# Patient Record
Sex: Male | Born: 1955 | Race: White | Hispanic: No | Marital: Married | Smoking: Former smoker
Health system: Southern US, Community
[De-identification: ages and names within clinical notes are randomized; demographics above are authoritative.]

## PROBLEM LIST (undated history)

## (undated) DIAGNOSIS — I10 Essential (primary) hypertension: Secondary | ICD-10-CM

## (undated) DIAGNOSIS — K2981 Duodenitis with bleeding: Secondary | ICD-10-CM

## (undated) DIAGNOSIS — Z85038 Personal history of other malignant neoplasm of large intestine: Secondary | ICD-10-CM

## (undated) DIAGNOSIS — G20A1 Parkinson's disease without dyskinesia, without mention of fluctuations: Secondary | ICD-10-CM

## (undated) DIAGNOSIS — I739 Peripheral vascular disease, unspecified: Secondary | ICD-10-CM

## (undated) DIAGNOSIS — K635 Polyp of colon: Secondary | ICD-10-CM

## (undated) DIAGNOSIS — E119 Type 2 diabetes mellitus without complications: Secondary | ICD-10-CM

## (undated) DIAGNOSIS — K219 Gastro-esophageal reflux disease without esophagitis: Secondary | ICD-10-CM

## (undated) DIAGNOSIS — G20 Parkinson's disease: Secondary | ICD-10-CM

## (undated) DIAGNOSIS — C801 Malignant (primary) neoplasm, unspecified: Secondary | ICD-10-CM

## (undated) DIAGNOSIS — J449 Chronic obstructive pulmonary disease, unspecified: Secondary | ICD-10-CM

## (undated) DIAGNOSIS — K269 Duodenal ulcer, unspecified as acute or chronic, without hemorrhage or perforation: Secondary | ICD-10-CM

## (undated) DIAGNOSIS — S72009A Fracture of unspecified part of neck of unspecified femur, initial encounter for closed fracture: Secondary | ICD-10-CM

## (undated) DIAGNOSIS — H3500 Unspecified background retinopathy: Secondary | ICD-10-CM

## (undated) HISTORY — PX: ANKLE ARTHROPLASTY: SUR68

## (undated) HISTORY — PX: COLONOSCOPY: SHX174

---

## 1996-05-11 HISTORY — PX: FLEXIBLE SIGMOIDOSCOPY: SHX1649

## 2004-11-14 ENCOUNTER — Ambulatory Visit: Payer: Self-pay | Admitting: Unknown Physician Specialty

## 2004-12-22 ENCOUNTER — Emergency Department: Payer: Self-pay | Admitting: Emergency Medicine

## 2007-09-09 HISTORY — PX: FINGER SURGERY: SHX640

## 2007-09-27 ENCOUNTER — Emergency Department: Payer: Self-pay | Admitting: Emergency Medicine

## 2008-04-10 ENCOUNTER — Ambulatory Visit: Payer: Self-pay | Admitting: Unknown Physician Specialty

## 2011-07-09 ENCOUNTER — Ambulatory Visit: Payer: Self-pay | Admitting: Unknown Physician Specialty

## 2011-07-09 HISTORY — PX: ESOPHAGOGASTRODUODENOSCOPY: SHX1529

## 2011-07-10 LAB — PATHOLOGY REPORT

## 2011-12-10 HISTORY — PX: ANKLE ARTHROPLASTY: SUR68

## 2014-08-17 ENCOUNTER — Ambulatory Visit: Payer: Self-pay | Admitting: Podiatry

## 2014-08-28 ENCOUNTER — Ambulatory Visit (INDEPENDENT_AMBULATORY_CARE_PROVIDER_SITE_OTHER): Payer: BLUE CROSS/BLUE SHIELD | Admitting: Podiatry

## 2014-08-28 ENCOUNTER — Encounter: Payer: Self-pay | Admitting: Podiatry

## 2014-08-28 VITALS — BP 171/91 | HR 66 | Resp 18 | Ht 70.0 in | Wt 195.0 lb

## 2014-08-28 DIAGNOSIS — E0842 Diabetes mellitus due to underlying condition with diabetic polyneuropathy: Secondary | ICD-10-CM | POA: Diagnosis not present

## 2014-08-28 DIAGNOSIS — Q828 Other specified congenital malformations of skin: Secondary | ICD-10-CM

## 2014-08-28 NOTE — Progress Notes (Signed)
Subjective:     Patient ID: John Reilly, male   DOB: March 08, 1956, 59 y.o.   MRN: [id number]  HPI patient states he has a painful callus on the left big toe that he developed recently and he does work extensive hours and steel toe shoes and has long-term history of diabetes but it's under good control with his last A1c being 6.8   Review of Systems  All other systems reviewed and are negative.      Objective:   Physical Exam  Constitutional: He is oriented to person, place, and time.  Cardiovascular: Intact distal pulses.   Musculoskeletal: Normal range of motion.  Neurological: He is oriented to person, place, and time.  Skin: Skin is warm.  Nursing note and vitals reviewed.  neurovascular status was found to be intact with mild diminishment of vibratory but sharp tall intact. Patient has good digital perfusion is well oriented 3 and I noted on the medial side of the left hallux there is keratotic lesion formation. There is no proximal edema erythema or drainage noted     Assessment:     Lesion which is probably due to friction and patient has mild neuropathy and diabetes allowing the lesion to become enlarged    Plan:     H&P and condition explained to patient. Today I did debridement of lesion did not no drainage and flushed the area applied sterile dressing and then applied padding to take pressure off of it. I will see him back if it reoccurs or if he should develop any redness drainage or other issues he will reappoint immediately

## 2014-08-28 NOTE — Progress Notes (Signed)
   Subjective:    Patient ID: John Reilly, male    DOB: 1955/12/12, 59 y.o.   MRN: [id number]  Diabetic pt has a callus on his left foot , medial side , looks to be infected. No treatment has been done by pt.  HPI    Review of Systems  All other systems reviewed and are negative.      Objective:   Physical Exam        Assessment & Plan:

## 2014-09-24 ENCOUNTER — Ambulatory Visit: Payer: BLUE CROSS/BLUE SHIELD | Admitting: Podiatry

## 2014-10-05 ENCOUNTER — Ambulatory Visit (INDEPENDENT_AMBULATORY_CARE_PROVIDER_SITE_OTHER): Payer: BLUE CROSS/BLUE SHIELD

## 2014-10-05 ENCOUNTER — Ambulatory Visit (INDEPENDENT_AMBULATORY_CARE_PROVIDER_SITE_OTHER): Payer: BLUE CROSS/BLUE SHIELD | Admitting: Podiatry

## 2014-10-05 VITALS — BP 181/91 | HR 81 | Resp 16

## 2014-10-05 DIAGNOSIS — L89891 Pressure ulcer of other site, stage 1: Secondary | ICD-10-CM

## 2014-10-05 DIAGNOSIS — L97521 Non-pressure chronic ulcer of other part of left foot limited to breakdown of skin: Secondary | ICD-10-CM

## 2014-10-05 MED ORDER — OXYCODONE-ACETAMINOPHEN 5-325 MG PO TABS: 1.0000 | ORAL_TABLET | Freq: Three times a day (TID) | ORAL | Status: DC | PRN

## 2014-10-09 NOTE — Progress Notes (Signed)
Subjective:     Patient ID: John Reilly, male   DOB: 14-Oct-1955, 59 y.o.   MRN: [id number]  HPI I was concerned because I have a lesion on my big toe again and I wanted to make sure it is not infected   Review of Systems     Objective:   Physical Exam Vascular status intact no change in health history with diabetes which has been Under good control. Patient has an abrasion on the plantar aspect of the left hallux with a blister which is recent in its appearance and it is localized with no proximal edema erythema or drainage noted    Assessment:     Localized breakdown of tissue secondary to friction and diabetic neuropathy with steel toe shoes as complicating factor    Plan:     Debridement of tissue flushed the area and applied Silvadene with dressing. Gave instructions on padding and if any redness should occur swelling or any other issues he is to let us know immediately and if not we will assume this will heal uneventfully with local wound care and soaks and padding

## 2014-11-06 ENCOUNTER — Ambulatory Visit (INDEPENDENT_AMBULATORY_CARE_PROVIDER_SITE_OTHER): Payer: BLUE CROSS/BLUE SHIELD | Admitting: Podiatry

## 2014-11-06 VITALS — BP 121/69 | HR 66 | Resp 16

## 2014-11-06 DIAGNOSIS — L97521 Non-pressure chronic ulcer of other part of left foot limited to breakdown of skin: Secondary | ICD-10-CM

## 2014-11-06 MED ORDER — OXYCODONE-ACETAMINOPHEN 5-325 MG PO TABS: 1.0000 | ORAL_TABLET | Freq: Three times a day (TID) | ORAL | Status: DC | PRN

## 2014-11-06 NOTE — Patient Instructions (Signed)
Continue daily dressing changes. Monitor for any signs/symptoms of infection. Call the office immediately if any occur or go directly to the emergency room. Call with any questions/concerns.  

## 2014-11-08 ENCOUNTER — Encounter: Payer: Self-pay | Admitting: Podiatry

## 2014-11-08 NOTE — Progress Notes (Signed)
Patient ID: John Reilly, male   DOB: 1955-05-17, 59 y.o.   MRN: [id number]  Subjective: 59 year old male presents the opposite a pop evaluation of blister/wound to the left big toe. He states that since last appointment the areas doing better. Has continued with daily dressing changes at home with an aquatic ointment and a bandage. He denies any redness or any drainage from around the area and denies any red streaks. He denies any systemic complaints such as fevers, chills, nausea, vomiting. He does have some pain to the area for which she takes Percocet at night if needed. No other complaints at this time. No acute changes since last appointment.   Objective: AAO x3, NAD DP/PT pulses palpable, CRT less than 3 seconds Protective sensation appears to be intact with [name] monofilament On the plantar aspect of the left hallux there is what appears to be a healing abrasion/deroofed blister which has overlying hyperkeratotic tissue and a granular superficial wound. There is no surrounding erythema, ascending cellulitis, fluctuance, crepitus, malodor, drainage/purulence. There is mild tailors palpation directly overlying the area. There is no probing, undermining, tunneling. No other open lesions or pre-ulcer lesions identified bilaterally. There is no pain with calf compression, swelling, warmth, erythema.  Assessment: 59 year old male with healing plantar hallux superficial wound, no signs of infection  Plan: -Treatment options discussed including all alternatives, risks, and complications -Lesion was sharply debrided without complication/bleeding. -Iodosorb was applied followed by dry sterile dressing. Recommended continue daily dressing changes at home with a pneumatic ointment and a bandage. -Monitor for any clinical signs or symptoms of infection and directed to call the office immediately should any occur or go to the ER. -Follow-up 3 weeks or sooner if any problems arise. In the  meantime, encouraged to call the office with any questions, concerns, change in symptoms.   Celesta Gentile, DPM

## 2014-11-27 ENCOUNTER — Ambulatory Visit (INDEPENDENT_AMBULATORY_CARE_PROVIDER_SITE_OTHER): Payer: BLUE CROSS/BLUE SHIELD | Admitting: Podiatry

## 2014-11-27 DIAGNOSIS — L89891 Pressure ulcer of other site, stage 1: Secondary | ICD-10-CM

## 2014-11-27 DIAGNOSIS — L97521 Non-pressure chronic ulcer of other part of left foot limited to breakdown of skin: Secondary | ICD-10-CM

## 2014-11-27 MED ORDER — SILVER SULFADIAZINE 1 % EX CREA: 1.0000 "application " | TOPICAL_CREAM | Freq: Every day | CUTANEOUS | Status: DC

## 2014-11-27 MED ORDER — OXYCODONE-ACETAMINOPHEN 5-325 MG PO TABS: 1.0000 | ORAL_TABLET | Freq: Three times a day (TID) | ORAL | Status: DC | PRN

## 2014-12-03 NOTE — Progress Notes (Signed)
Patient ID: BLU LORI, male   DOB: [birth year], 59 y.o.   MRN: [id number]  Subjective: 59 year old male presents to the office for evaluation of wound to the left big toe. He states that since last appointment the areas doing better and he feels that it is healing. It does continue to be somewhat painful, especially at night which he takes pain medication. He states he is not taking it during the day. Has continued with daily dressing changes at home with antibiotic ointment and a bandage. He denies any redness or any drainage from around the area and denies any red streaks. He denies any systemic complaints such as fevers, chills, nausea, vomiting. He does have some pain to the area for which she takes Percocet at night if needed. No other complaints at this time. No acute changes since last appointment.   Objective: AAO x3, NAD DP/PT pulses palpable, CRT less than 3 seconds Protective sensation appears to be intact with [name] monofilament On the plantar aspect of the left hallux there is what appears to be a healing abrasion/deroofed blister which has overlying hyperkeratotic tissue and a granular superficial wound. At today's appointment the wound measures 0.3 x 0.2cm and is granular. There is no surrounding erythema, ascending cellulitis, fluctuance, crepitus, malodor, drainage/purulence. There is mild tenderness to palpation directly overlying the area. There is no probing, undermining, tunneling. No other open lesions or pre-ulcer lesions identified bilaterally. There is no pain with calf compression, swelling, warmth, erythema.  Assessment: 59 year old male with healing plantar hallux superficial wound, no signs of infection  Plan: -Treatment options discussed including all alternatives, risks, and complications -Wound which will be debrided to healthy, bleeding, granular wound base. -Iodosorb was applied followed by dry sterile dressing. Recommended continue daily dressing changes  at home with a antibiotic ointment and a bandage. -Monitor for any clinical signs or symptoms of infection and directed to call the office immediately should any occur or go to the ER. -Follow-up 3 weeks or sooner if any problems arise. In the meantime, encouraged to call the office with any questions, concerns, change in symptoms.   Celesta Gentile, DPM

## 2014-12-18 ENCOUNTER — Ambulatory Visit (INDEPENDENT_AMBULATORY_CARE_PROVIDER_SITE_OTHER): Payer: BLUE CROSS/BLUE SHIELD | Admitting: Podiatry

## 2014-12-18 DIAGNOSIS — L97521 Non-pressure chronic ulcer of other part of left foot limited to breakdown of skin: Secondary | ICD-10-CM

## 2014-12-18 MED ORDER — HYDROCODONE-ACETAMINOPHEN 5-325 MG PO TABS: 1.0000 | ORAL_TABLET | Freq: Four times a day (QID) | ORAL | Status: DC | PRN

## 2014-12-18 NOTE — Patient Instructions (Signed)
Continue daily dressing changes. Monitor for any signs/symptoms of infection. Call the office immediately if any occur or go directly to the emergency room. Call with any questions/concerns.  

## 2014-12-19 NOTE — Progress Notes (Signed)
Patient ID: John Reilly, male   DOB: 1955-08-10, 59 y.o.   MRN: [id number]  Subjective: 59 year old male presents to the office for continued care of wound to the left big toe. He has continued to apply Silvadene and a dressing daily to the area. He believes that the wound is healing all he does get some discomfort to the area of daily living and the day after being on his feet. He denies any surrounding redness or red streaks. Denies any purulence or drainage. Denies any malodor. No other complaints at this time. Denies any systemic complaints as fevers, chills, nausea, vomiting. Denies any calf pain, chest pain, soreness of breath.  Objective: AAO x3, NAD DP/PT pulses palpable, CRT less than 3 seconds Protective sensation appears to be intact with John Reilly monofilament On the plantar aspect of the left hallux there isEvidence of a healing ulceration.At today's appointment the wound measures 0.2 x 0.1cm and is granular superficial. periwound is hyperkeratotic. There is no surrounding erythema, ascending cellulitis, fluctuance, crepitus, malodor, drainage/purulence. There is mild tenderness to palpation directly overlying the area. There is no probing, undermining, tunneling. No other open lesions or pre-ulcer lesions identified bilaterally. There is no pain with calf compression, swelling, warmth, erythema.  Assessment: 59 year old male with healing plantar hallux superficial wound It is healing, no signs of infection  Plan: -Treatment options discussed including all alternatives, risks, and complications -Wound which will be debrided to healthy, bleeding, granular wound base. -Iodosorb was applied followed by dry sterile dressing. Recommended continue daily dressing changes at home with silvadene and a bandage. -Continue offloading of the wound. -Rx Vicodin. Again discussed with him and he cannot drive or work while taking pain medicine. -Monitor for any clinical signs or symptoms of  infection and directed to call the office immediately should any occur or go to the ER. -Follow-up 3 weeks or sooner if any problems arise. In the meantime, encouraged to call the office with any questions, concerns, change in symptoms.   John Reilly, DPM

## 2015-01-08 ENCOUNTER — Ambulatory Visit (INDEPENDENT_AMBULATORY_CARE_PROVIDER_SITE_OTHER): Payer: BLUE CROSS/BLUE SHIELD | Admitting: Podiatry

## 2015-01-08 ENCOUNTER — Encounter: Payer: Self-pay | Admitting: Podiatry

## 2015-01-08 VITALS — BP 156/79 | HR 75 | Resp 18

## 2015-01-08 DIAGNOSIS — E0842 Diabetes mellitus due to underlying condition with diabetic polyneuropathy: Secondary | ICD-10-CM | POA: Diagnosis not present

## 2015-01-08 DIAGNOSIS — L97521 Non-pressure chronic ulcer of other part of left foot limited to breakdown of skin: Secondary | ICD-10-CM | POA: Diagnosis not present

## 2015-01-08 NOTE — Progress Notes (Signed)
Patient ID: John Reilly, male   DOB: 1955/12/23, 59 y.o.   MRN: [id number]  Subjective: 59 year old male presents to the office for continued care of wound to the left big toe. He has continued to apply Silvadene and a dressing daily to the area. He believes that the wound is healing all he does get some discomfort,after being on his feet all day. He denies any surrounding redness or red streaks. Denies any purulence or drainage. Denies any malodor. No other complaints at this time. Denies any systemic complaints as fevers, chills, nausea, vomiting. Denies any calf pain, chest pain, soreness of breath.  Objective: AAO x3, NAD DP/PT pulses palpable, CRT less than 3 seconds Protective sensation appears to be intact with [name] monofilament On the plantar aspect of the left hallux there isEvidence of a healing ulceration.At today's appointment the wound measures 0.2 x 0.1cm and is granular superficial. periwound is hyperkeratotic. The wound appears to be more superficial. There is no surrounding erythema, ascending cellulitis, fluctuance, crepitus, malodor, drainage/purulence. There is no probing, undermining, tunneling. No other open lesions or pre-ulcer lesions identified bilaterally. There is no pain with calf compression, swelling, warmth, erythema.  Assessment: 59 year old male with healing plantar hallux superficial wound It is healing, no signs of infection  Plan: -Treatment options discussed including all alternatives, risks, and complications -Wound which will be debrided to healthy, bleeding, granular wound base. -Iodosorb was applied followed by dry sterile dressing. Recommended continue daily dressing changes at home with silvadene and a bandage. He can go with a dry bandage at night to help dry the wound.  -Continue offloading of the wound -Monitor for any clinical signs or symptoms of infection and directed to call the office immediately should any occur or go to the  ER. -Follow-up 3 weeks or sooner if any problems arise. In the meantime, encouraged to call the office with any questions, concerns, change in symptoms.   Celesta Gentile, DPM

## 2015-01-29 ENCOUNTER — Ambulatory Visit (INDEPENDENT_AMBULATORY_CARE_PROVIDER_SITE_OTHER): Payer: BLUE CROSS/BLUE SHIELD | Admitting: Podiatry

## 2015-01-29 ENCOUNTER — Encounter: Payer: Self-pay | Admitting: Podiatry

## 2015-01-29 VITALS — BP 141/69 | HR 76 | Resp 18

## 2015-01-29 DIAGNOSIS — L97521 Non-pressure chronic ulcer of other part of left foot limited to breakdown of skin: Secondary | ICD-10-CM | POA: Diagnosis not present

## 2015-01-29 NOTE — Progress Notes (Signed)
Patient ID: John Reilly, male   DOB: 06/19/1955, 59 y.o.   MRN: [id number]  Subjective: 59 year old male presents to the office for continued care of wound to the left big toe. He has continued to apply Silvadene and a dressing daily to the area. He states he changes of the wound once a day in the morning. He gets some occasional bloody drainage from the wound however denies any pus. Denies any surrounding redness or red streaks. He currently denies any systemic complaints as fevers, chills, nausea, vomiting. No calf pain, chest pain, shortness of breath.  Objective: AAO x3, NAD DP/PT pulses palpable, CRT less than 3 secondst On the plantar aspect of the left hallux there is continued evidence of a healing ulceration the wound appears to be almost an abrasion type wound at this time with surrounding hyperkeratotic lesion. Upon debridement there wound is granular and is very superficial. There is no swelling erythema, ascending Silastic, fluctuance, crepitus, malodor, drainage. There is no probing, undermining, tunneling. No other open lesions or pre-ulcerative lesions. There is no pain with calf compression, sling, warmth, erythema. There is chronic bilateral lower shoe me edema and skin changes consistent with venous insufficiency.  Assessment: 59 year old male with healing plantar hallux superficial wound It is healing, no signs of infection  Plan: -Treatment options discussed including all alternatives, risks, and complications -Wound was debrided to healthy, bleeding, granular wound base. -Iodosorb was applied followed by dry sterile dressing. Recommended continue daily dressing changes at home with silvadene and a bandage. He can go with a dry bandage at night to help dry the wound. Recommended clean the wound daily especially after working with intra-arterial soap. -Continue offloading of the wound. Dispensed. Suffering pads today. -Monitor for any clinical signs or symptoms of infection and  directed to call the office immediately should any occur or go to the ER. -Follow-up 4 weeks or sooner if any problems arise. In the meantime, encouraged to call the office with any questions, concerns, change in symptoms.   Celesta Gentile, DPM

## 2015-02-01 ENCOUNTER — Ambulatory Visit
Admission: EM | Admit: 2015-02-01 | Discharge: 2015-02-01 | Disposition: A | Discharge status: Home / Self Care | Payer: BLUE CROSS/BLUE SHIELD | Attending: Internal Medicine | Admitting: Internal Medicine

## 2015-02-01 ENCOUNTER — Ambulatory Visit: Payer: BLUE CROSS/BLUE SHIELD

## 2015-02-01 DIAGNOSIS — S81811A Laceration without foreign body, right lower leg, initial encounter: Secondary | ICD-10-CM | POA: Diagnosis not present

## 2015-02-01 HISTORY — DX: Essential (primary) hypertension: I10

## 2015-02-01 HISTORY — DX: Malignant (primary) neoplasm, unspecified: C80.1

## 2015-02-01 HISTORY — DX: Gastro-esophageal reflux disease without esophagitis: K21.9

## 2015-02-01 HISTORY — DX: Type 2 diabetes mellitus without complications: E11.9

## 2015-02-01 MED ORDER — OXYCODONE-ACETAMINOPHEN 5-325 MG PO TABS: 1.0000 | ORAL_TABLET | Freq: Three times a day (TID) | ORAL | Status: DC | PRN

## 2015-02-01 MED ORDER — SULFAMETHOXAZOLE-TRIMETHOPRIM 800-160 MG PO TABS: 1.0000 | ORAL_TABLET | Freq: Two times a day (BID) | ORAL | Status: AC

## 2015-02-01 MED ORDER — LIDOCAINE HCL (PF) 1 % IJ SOLN
5.0000 mL | Freq: Once | INTRAMUSCULAR | Status: AC
  Administered 2015-02-01: 5 mL

## 2015-02-01 MED ORDER — LIDOCAINE-EPINEPHRINE-TETRACAINE (LET) SOLUTION
3.0000 mL | Freq: Once | NASAL | Status: AC
  Administered 2015-02-01: 3 mL via TOPICAL

## 2015-02-01 MED ORDER — TETANUS-DIPHTH-ACELL PERTUSSIS 5-2.5-18.5 LF-MCG/0.5 IM SUSP
0.5000 mL | Freq: Once | INTRAMUSCULAR | Status: AC
  Administered 2015-02-01: 0.5 mL via INTRAMUSCULAR

## 2015-02-01 NOTE — ED Notes (Signed)
Walked into a dolly at has large skin tear/laceration right lower anterior leg. Hx Type 1 Diabetes.

## 2015-02-01 NOTE — Discharge Instructions (Signed)
Keep clean and dry. Take medication as prescribed. Clean daily with soap and water, rinse, pat dry then apply thin layer topical antibiotic. Elevate legs.   Return to Urgent care in 2 days for would check.   Return to Urgent care in 10 days for suture removal. Return to Urgent care sooner for increased pain, swelling, redness, drainage, new or worsening concerns.   Laceration Care, Adult A laceration is a cut or lesion that goes through all layers of the skin and into the tissue just beneath the skin. TREATMENT  Some lacerations may not require closure. Some lacerations may not be able to be closed due to an increased risk of infection. It is important to see your caregiver as soon as possible after an injury to minimize the risk of infection and maximize the opportunity for successful closure. If closure is appropriate, pain medicines may be given, if needed. The wound will be cleaned to help prevent infection. Your caregiver will use stitches (sutures), staples, wound glue (adhesive), or skin adhesive strips to repair the laceration. These tools bring the skin edges together to allow for faster healing and a better cosmetic outcome. However, all wounds will heal with a scar. Once the wound has healed, scarring can be minimized by covering the wound with sunscreen during the day for 1 full year. HOME CARE INSTRUCTIONS  For sutures or staples:  Keep the wound clean and dry.  If you were given a bandage (dressing), you should change it at least once a day. Also, change the dressing if it becomes wet or dirty, or as directed by your caregiver.  Wash the wound with soap and water 2 times a day. Rinse the wound off with water to remove all soap. Pat the wound dry with a clean towel.  After cleaning, apply a thin layer of the antibiotic ointment as recommended by your caregiver. This will help prevent infection and keep the dressing from sticking.  You may shower as usual after the first 24 hours.  Do not soak the wound in water until the sutures are removed.  Only take over-the-counter or prescription medicines for pain, discomfort, or fever as directed by your caregiver.  Get your sutures or staples removed as directed by your caregiver. For skin adhesive strips:  Keep the wound clean and dry.  Do not get the skin adhesive strips wet. You may bathe carefully, using caution to keep the wound dry.  If the wound gets wet, pat it dry with a clean towel.  Skin adhesive strips will fall off on their own. You may trim the strips as the wound heals. Do not remove skin adhesive strips that are still stuck to the wound. They will fall off in time. For wound adhesive:  You may briefly wet your wound in the shower or bath. Do not soak or scrub the wound. Do not swim. Avoid periods of heavy perspiration until the skin adhesive has fallen off on its own. After showering or bathing, gently pat the wound dry with a clean towel.  Do not apply liquid medicine, cream medicine, or ointment medicine to your wound while the skin adhesive is in place. This may loosen the film before your wound is healed.  If a dressing is placed over the wound, be careful not to apply tape directly over the skin adhesive. This may cause the adhesive to be pulled off before the wound is healed.  Avoid prolonged exposure to sunlight or tanning lamps while the skin adhesive is  in place. Exposure to ultraviolet light in the first year will darken the scar.  The skin adhesive will usually remain in place for 5 to 10 days, then naturally fall off the skin. Do not pick at the adhesive film. You may need a tetanus shot if:  You cannot remember when you had your last tetanus shot.  You have never had a tetanus shot. If you get a tetanus shot, your arm may swell, get red, and feel warm to the touch. This is common and not a problem. If you need a tetanus shot and you choose not to have one, there is a rare chance of getting  tetanus. Sickness from tetanus can be serious. SEEK MEDICAL CARE IF:   You have redness, swelling, or increasing pain in the wound.  You see a red line that goes away from the wound.  You have yellowish-white fluid (pus) coming from the wound.  You have a fever.  You notice a bad smell coming from the wound or dressing.  Your wound breaks open before or after sutures have been removed.  You notice something coming out of the wound such as wood or glass.  Your wound is on your hand or foot and you cannot move a finger or toe. SEEK IMMEDIATE MEDICAL CARE IF:   Your pain is not controlled with prescribed medicine.  You have severe swelling around the wound causing pain and numbness or a change in color in your arm, hand, leg, or foot.  Your wound splits open and starts bleeding.  You have worsening numbness, weakness, or loss of function of any joint around or beyond the wound.  You develop painful lumps near the wound or on the skin anywhere on your body. MAKE SURE YOU:   Understand these instructions.  Will watch your condition.  Will get help right away if you are not doing well or get worse. Document Released: 04/27/2005 Document Revised: 07/20/2011 Document Reviewed: 10/21/2010 [facility] Patient Information 2015 [place], [place]. This information is not intended to replace advice given to you by your health care provider. Make sure you discuss any questions you have with your health care provider.

## 2015-02-01 NOTE — ED Notes (Signed)
Family at bedside. Patient up to go to the restroom, noted to have been incontinent of urine.

## 2015-02-01 NOTE — ED Provider Notes (Signed)
[facility] Emergency Department Provider Note  ____________________________________________  Time seen: Approximately 6:35 PM  I have reviewed the triage vital signs and the nursing notes.   HISTORY  Chief Complaint Laceration   HPI John Reilly is a 59 y.o. male presents with complaint of laceration. Patient reports that approximately 3 PM this afternoon he was at work and in a hurry due to shift change and states that he was walking through an area where 2 dollies were sitting side-by-side. States that he was trying to get through that area quickly and walked directly into the backside of the dolly which then hit at the same level on his right lower leg causing laceration. Denies Worker's Compensation injury. Reports unsure of last tetanus immunization.  Patient reports that he cleaned it immediately but then did not fully realize how large it was which is why he is just now presenting to be seen. States current pain is 5 out of 10 aching. States intermittent bleeding since. Denies fall. Denies head injury or loss of conscious. Denies other pain or injury.  Patient reports he is a type I diabetic on chronic insulin therapy including insulin pump. Patient also reports that he has recently been following podiatry for chronic left great toe wound which is now healing well. Denies recent antibiotic use.  Denies fall, head injury. Denies chest pain, shortness of breath, abdominal pain, nausea, vomiting, weakness, or other complaints.    Past Medical History  Diagnosis Date  . Diabetes mellitus without complication   . Hypertension   . Cancer   . GERD (gastroesophageal reflux disease)     HTN Diabetes: type 1  Chronic bilateral lower extremity edema Gastroesophageal reflux disease without esophagitis Insulin pump titration Secondary osteoarthritis of right ankle   There are no active problems to display for this patient.   Past Surgical History   Procedure Laterality Date  . Ankle arthroplasty      2013     Multiple right ankle surgeries   Current Outpatient Rx  Name  Route  Sig  Dispense  Refill  . amLODipine (NORVASC) 10 MG tablet   Oral   Take 10 mg by mouth daily.         . enalapril (VASOTEC) 20 MG tablet   Oral   Take 20 mg by mouth daily.         . folic acid (FOLVITE) 1 MG tablet   Oral   Take 1 mg by mouth daily.         Marland Kitchen glucagon 1 MG injection   Intravenous   Inject 1 mg into the vein once as needed.         . hydrochlorothiazide (HYDRODIURIL) 25 MG tablet   Oral   Take 25 mg by mouth daily.         Marland Kitchen HYDROcodone-acetaminophen (NORCO/VICODIN) 5-325 MG per tablet   Oral   Take 1 tablet by mouth every 6 (six) hours as needed.   20 tablet   0   . Insulin Infusion Pump Supplies (PARADIGM PUMP RESERVOIR 1.76ML) MISC   Does not apply   1.8 mLs by Does not apply route.         . insulin lispro (HUMALOG) 100 UNIT/ML injection   Subcutaneous   Inject 100 Units into the skin as needed for high blood sugar.         . metoprolol (LOPRESSOR) 50 MG tablet   Oral   Take 50 mg by mouth daily.         Marland Kitchen  Multiple Vitamin (MULTIVITAMIN) capsule   Oral   Take 1 capsule by mouth daily.         Marland Kitchen omeprazole (PRILOSEC) 40 MG capsule   Oral   Take 40 mg by mouth daily.         Marland Kitchen oxyCODONE-acetaminophen (PERCOCET/ROXICET) 5-325 MG per tablet   Oral   Take 1 tablet by mouth every 6 (six) hours as needed for severe pain.         Marland Kitchen oxyCODONE-acetaminophen (PERCOCET/ROXICET) 5-325 MG per tablet   Oral   Take 1 tablet by mouth every 8 (eight) hours as needed for severe pain.   20 tablet   0   . silver sulfADIAZINE (SILVADENE) 1 % cream   Topical   Apply 1 application topically daily.   50 g   0   . traMADol (ULTRAM) 50 MG tablet   Oral   Take 50 mg by mouth every 8 (eight) hours as needed.           Allergies Penicillins and Penicillin v potassium  Family History   Problem Relation Age of Onset  . Stroke Mother   . Alzheimer's disease Father   . Diabetes Brother     Social History Social History  Substance Use Topics  . Smoking status: Former Research scientist (life sciences)  . Smokeless tobacco: Never Used  . Alcohol Use: 0.0 oz/week    0 Standard drinks or equivalent per week     Comment: 6 beers per day    Review of Systems Constitutional: No fever/chills Eyes: No visual changes. ENT: No sore throat. Cardiovascular: Denies chest pain. Respiratory: Denies shortness of breath. Gastrointestinal: No abdominal pain.  No nausea, no vomiting.  No diarrhea.  No constipation. Genitourinary: Negative for dysuria. Musculoskeletal: Negative for back pain. Skin: Negative for rash. Positive for laceration  Neurological: Negative for headaches, focal weakness or numbness.  10-point ROS otherwise negative.  ____________________________________________   PHYSICAL EXAM:  VITAL SIGNS: ED Triage Vitals  Enc Vitals Group     BP 02/01/15 1831 144/71 mmHg     Pulse Rate 02/01/15 1831 74     Resp 02/01/15 1831 18     Temp 02/01/15 1831 97.9 F (36.6 C)     Temp Source 02/01/15 1831 Tympanic     SpO2 02/01/15 1831 100 %     Weight 02/01/15 1831 195 lb (88.451 kg)     Height 02/01/15 1831 5\' 10"  (1.778 m)     Head Cir --      Peak Flow --      Pain Score --      Pain Loc --      Pain Edu? --      Excl. in [place]? --     Constitutional: Alert and oriented. Well appearing and in no acute distress. Eyes: Conjunctivae are normal. PERRL. EOMI. Head: Atraumatic.  Ears: no erythema, normal TMs bilaterally.   Nose: No congestion/rhinnorhea.  Mouth/Throat: Mucous membranes are moist.  Oropharynx non-erythematous. Neck: No stridor.  No cervical spine tenderness to palpation. Hematological/Lymphatic/Immunilogical: No cervical lymphadenopathy. Cardiovascular: Normal rate, regular rhythm. Grossly normal heart sounds.  Good peripheral circulation. Respiratory: Normal  respiratory effort.  No retractions. Lungs CTAB. Gastrointestinal: Soft and nontender. No distention. Normal Bowel sounds.   Musculoskeletal: No lower or upper extremity tenderness.  No joint effusions. Bilateral pedal pulses equal and easily palpated. No cervical, thoracic or lumbar tenderness to palpation. Bilateral lower extremities mild to mod edema, nonpitting, per patient chronic and unchanged.  Neurologic:  Normal speech and language. No gross focal neurologic deficits are appreciated. No gait instability. Steady gait. Skin:  Skin is warm, dry and intact. No rash noted. Except : Right distal anterior tibial area with flap laceration present, laceration approximately 7 cm. No active bleeding. Mild to moderate tenderness to palpation at laceration site as well as direct surrounding tissue. No foreign body visualized.  Psychiatric: Mood and affect are normal. Speech and behavior are normal.  ____________________________________________   LABS (all labs ordered are listed, but only abnormal results are displayed)  Labs Reviewed - No data to display  RADIOLOGY  EXAM: RIGHT TIBIA AND FIBULA - 2 VIEW  COMPARISON: None.  FINDINGS: Likely chronic deformity of the distal tibia and fibula. No evidence for acute fracture or dislocation. Tibiotalar joint degenerative changes. Plantar calcaneal spurring. Regional soft tissues are unremarkable.  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   Electronically Signed By: Lovey Newcomer M.D. On: 02/01/2015 19:48 ____________________________________________   PROCEDURES  Procedure(s) performed:  Procedure(s) performed:  Procedure explained and verbal consent obtained. Consent: Verbal consent obtained. Written consent not obtained. Risks and benefits: risks, benefits and alternatives were discussed Patient identity confirmed: verbally with patient and hospital-assigned identification number  Consent given by:  patient   Laceration Repair Location: right lower leg Length: 7 cm Foreign bodies: no foreign bodies Tendon involvement: none Nerve involvement: none Preparation: Patient was prepped and draped in the usual sterile fashion. Anesthesia with 1% Lidocaine 5 mls, and LET Irrigation solution: saline Irrigation method: jet lavage Amount of cleaning: copious Repaired with 5-0 nylon  Number of sutures: 16 Technique: simple interrupted  Approximation: loose Patient tolerate well. Wound well approximated post repair.  Antibiotic ointment and dressing applied.  Wound care instructions provided.  Observe for any signs of infection or other problems.     ____________________________   INITIAL IMPRESSION / ASSESSMENT AND PLAN / ED COURSE  Pertinent labs & imaging results that were available during my care of the patient were reviewed by me and considered in my medical decision making (see chart for details).  Well-appearing patient. No acute distress. Presents for right lower leg laceration sustained personally 3 PM this afternoon. Denies Worker's Compensation injury. Reports continues ambulate well and without pain. Right lower anterior leg with flap-like laceration. Will evaluate x-ray. Tetanus immunization updated.    Right tib-fib x-ray negative for acute changes, chronic deformity of the distal tibia and fibula. Large flap laceration repaired with time 16 sutures. Patient return in 2 days for wound check. Return in approximately 10 days for suture removal. Discussed in detail and further reiterated with patient the importance of elevation due to his chronic lower leg edema. Patient reports that he will elevate leg at home using his wedge he had from previous surgery. Patient and spouse verbalized understanding of the need for elevation as well as proper wound cleaning. Will prescribe patient Bactrim prophylactically as chronic lower leg swelling as well as wound being open for several hours  prior to closure and patient penicillin allergic.when necessary Percocet as needed quantity #9 given. Discussed very importance of close follow up. Discussed follow up with Primary care physician this week. Discussed follow up and return parameters including no resolution or any worsening concerns. Patient verbalized understanding and agreed to plan.   ___________________________________________   FINAL CLINICAL IMPRESSION(S) / ED DIAGNOSES  Final diagnoses:  Leg laceration, right, initial encounter       Marylene Land, NP 02/01/15 2051

## 2015-02-03 ENCOUNTER — Ambulatory Visit
Admission: EM | Admit: 2015-02-03 | Discharge: 2015-02-03 | Disposition: A | Discharge status: Home / Self Care | Payer: BLUE CROSS/BLUE SHIELD | Attending: Internal Medicine | Admitting: Internal Medicine

## 2015-02-03 ENCOUNTER — Encounter: Payer: Self-pay | Admitting: Gynecology

## 2015-02-03 DIAGNOSIS — Z4801 Encounter for change or removal of surgical wound dressing: Secondary | ICD-10-CM

## 2015-02-03 DIAGNOSIS — IMO0002 Reserved for concepts with insufficient information to code with codable children: Secondary | ICD-10-CM

## 2015-02-03 MED ORDER — BACITRACIN ZINC 500 UNIT/GM EX OINT: TOPICAL_OINTMENT | Freq: Two times a day (BID) | CUTANEOUS | Status: DC

## 2015-02-03 NOTE — ED Provider Notes (Signed)
[facility] Emergency Department Provider Note  ____________________________________________  Time seen: Approximately 12:37 PM  I have reviewed the triage vital signs and the nursing notes.   HISTORY  Chief Complaint Wound Check   HPI John Reilly is a 59 y.o. male presents with a complaint of laceration check. Patient reports that he was seen in urgent care 2 days ago and had laceration repaired. Patient states that original injury was second to accidentally walk into a metal dolly which then caused laceration to right lower leg. Patient reports that he feels that the laceration is healing well and he has not had any pain. Patient states that he followed the urgent cares encouragement and stayed home from work yesterday as well as today to elevate leg as patient with chronic history of bilateral lower extremity edema. Patient reports that the swelling has dramatically improved with resting and elevating his legs and also reports that he will use his compression stockings when he returns to work.  Patient reports that he has been taking oral antibiotics and tolerating well. Denies pain, drainage, erythema, fever or other complaints. Reports that he feels that it is healing well and here for recheck.   Past Medical History  Diagnosis Date  . Diabetes mellitus without complication   . Hypertension   . Cancer   . GERD (gastroesophageal reflux disease)   Chronic bilateral lower extremity edema  There are no active problems to display for this patient.   Past Surgical History  Procedure Laterality Date  . Ankle arthroplasty      2013    Current Outpatient Rx  Name  Route  Sig  Dispense  Refill  . amLODipine (NORVASC) 10 MG tablet   Oral   Take 10 mg by mouth daily.         . enalapril (VASOTEC) 20 MG tablet   Oral   Take 20 mg by mouth daily.         . folic acid (FOLVITE) 1 MG tablet   Oral   Take 1 mg by mouth daily.         Marland Kitchen glucagon 1  MG injection   Intravenous   Inject 1 mg into the vein once as needed.         . hydrochlorothiazide (HYDRODIURIL) 25 MG tablet   Oral   Take 25 mg by mouth daily.         Marland Kitchen HYDROcodone-acetaminophen (NORCO/VICODIN) 5-325 MG per tablet   Oral   Take 1 tablet by mouth every 6 (six) hours as needed.   20 tablet   0   . Insulin Infusion Pump Supplies (PARADIGM PUMP RESERVOIR 1.76ML) MISC   Does not apply   1.8 mLs by Does not apply route.         . insulin lispro (HUMALOG) 100 UNIT/ML injection   Subcutaneous   Inject 100 Units into the skin as needed for high blood sugar.         . metoprolol (LOPRESSOR) 50 MG tablet   Oral   Take 50 mg by mouth daily.         . Multiple Vitamin (MULTIVITAMIN) capsule   Oral   Take 1 capsule by mouth daily.         Marland Kitchen omeprazole (PRILOSEC) 40 MG capsule   Oral   Take 40 mg by mouth daily.         Marland Kitchen oxyCODONE-acetaminophen (ROXICET) 5-325 MG per tablet   Oral   Take  1 tablet by mouth every 8 (eight) hours as needed for moderate pain or severe pain (Do not drive or operate heavy machinery while taking as can cause drowsiness.).   9 tablet   0   . silver sulfADIAZINE (SILVADENE) 1 % cream   Topical   Apply 1 application topically daily.   50 g   0   . sulfamethoxazole-trimethoprim (BACTRIM DS,SEPTRA DS) 800-160 MG per tablet   Oral   Take 1 tablet by mouth 2 (two) times daily.   14 tablet   0   . traMADol (ULTRAM) 50 MG tablet   Oral   Take 50 mg by mouth every 8 (eight) hours as needed.           Allergies Penicillins and Penicillin v potassium  Family History  Problem Relation Age of Onset  . Stroke Mother   . Alzheimer's disease Father   . Diabetes Brother     Social History Social History  Substance Use Topics  . Smoking status: Former Research scientist (life sciences)  . Smokeless tobacco: Never Used  . Alcohol Use: 0.0 oz/week    0 Standard drinks or equivalent per week     Comment: 6 beers per day    Review of  Systems Constitutional: No fever/chills Eyes: No visual changes. ENT: No sore throat. Cardiovascular: Denies chest pain. Respiratory: Denies shortness of breath. Gastrointestinal: No abdominal pain.  No nausea, no vomiting.  No diarrhea.  No constipation. Genitourinary: Negative for dysuria. Musculoskeletal: Negative for back pain. Skin: Negative for rash. Laceration to right lower leg Neurological: Negative for headaches, focal weakness or numbness.  10-point ROS otherwise negative.  ____________________________________________   PHYSICAL EXAM:  VITAL SIGNS: ED Triage Vitals  Enc Vitals Group     BP 02/03/15 1151 124/49 mmHg     Pulse Rate 02/03/15 1151 73     Resp 02/03/15 1151 18     Temp 02/03/15 1151 98.3 F (36.8 C)     Temp Source 02/03/15 1151 Oral     SpO2 02/03/15 1151 98 %     Weight 02/03/15 1151 195 lb (88.451 kg)     Height 02/03/15 1151 5\' 10"  (1.778 m)     Head Cir --      Peak Flow --      Pain Score 02/03/15 1156 0     Pain Loc --      Pain Edu? --      Excl. in [place]? --     Constitutional: Alert and oriented. Well appearing and in no acute distress. Eyes: Conjunctivae are normal. PERRL. EOMI. Head: Atraumatic.  Mouth/Throat: Mucous membranes are moist.  Cardiovascular: Normal rate, regular rhythm. Grossly normal heart sounds.  Good peripheral circulation. Respiratory: Normal respiratory effort.  No retractions. Lungs CTAB. Gastrointestinal: Soft and nontender. No distention. Normal Bowel sounds.  Musculoskeletal: No lower or upper extremity tenderness. Minimal bilateral lower extremity edema, non pitting. Bilateral calfs nontender. Bilateral pedal pulses equal and easily palpated.  Neurologic:  Normal speech and language. No gross focal neurologic deficits are appreciated. No gait instability. Skin:  Skin is warm, dry and intact. No rash noted. Except: anterior distal right lower leg with healing 7cm laceration with x 16 sutures present.Well  approximated. Minimal erythema directly along laceration site, no surrounding erythema. Nontender. No exudate, drainage, surrounding erythema. No signs of infection.  Psychiatric: Mood and affect are normal. Speech and behavior are normal.  _____________________________________   INITIAL IMPRESSION / ASSESSMENT AND PLAN / ED COURSE  Pertinent labs &  imaging results that were available during my care of the patient were reviewed by me and considered in my medical decision making (see chart for details).  Well appearing. No acute distress. Presents for laceration recheck. X 16 sutures present. Wound well approximated. Patient has elevated legs which has improved his chronic bilateral lower extremity edema. Bilateral pedal pulses equal and easily palpated. Healing laceration. No surrounding erythema, exudate or discharge. Patient currently taking bactrim. Continue bactrim. Wound cleaned with betadine and topical antibiotic and dressing applied The patient is alerted to watch for any signs of infection (redness, pus, pain, increased swelling or fever) and seek immediate care if occurs.  Home wound care instructions are provided. Return in 7 days for suture removal. Discussed follow up with Primary care physician this week. Discussed follow up and return parameters including no resolution or any worsening concerns. Patient and wife verbalized understanding and agreed to plan.   ___________________________________________   FINAL CLINICAL IMPRESSION(S) / ED DIAGNOSES  Final diagnoses:  Laceration re-check       Marylene Land, NP 02/03/15 1252

## 2015-02-03 NOTE — ED Notes (Signed)
Follow up laceration at right leg on 02/01/2015.

## 2015-02-03 NOTE — Discharge Instructions (Signed)
You continue to take medication at home as prescribed. Continue to elevate. Continue to clean daily with soap and water, rinse, pat dry and apply thin layer topical antibiotic ointment. Allow some open area time at home and clean environment. Keep covered if at work.   Return to urgent care in 1 week for suture removal. Monitor wound very closely. As discussed return immediately for any redness, drainage, pain, increased swelling or other concerns.  Wound Care Wound care helps prevent pain and infection.  You may need a tetanus shot if:  You cannot remember when you had your last tetanus shot.  You have never had a tetanus shot.  The injury broke your skin. If you need a tetanus shot and you choose not to have one, you may get tetanus. Sickness from tetanus can be serious. HOME CARE   Only take medicine as told by your doctor.  Clean the wound daily with mild soap and water.  Change any bandages (dressings) as told by your doctor.  Put medicated cream and a bandage on the wound as told by your doctor.  Change the bandage if it gets wet, dirty, or starts to smell.  Take showers. Do not take baths, swim, or do anything that puts your wound under water.  Rest and raise (elevate) the wound until the pain and puffiness (swelling) are better.  Keep all doctor visits as told. GET HELP RIGHT AWAY IF:   Yellowish-white fluid (pus) comes from the wound.  Medicine does not lessen your pain.  There is a red streak going away from the wound.  You have a fever. MAKE SURE YOU:   Understand these instructions.  Will watch your condition.  Will get help right away if you are not doing well or get worse. Document Released: 02/04/2008 Document Revised: 07/20/2011 Document Reviewed: 08/31/2010 [facility] Patient Information 2015 [place], [place]. This information is not intended to replace advice given to you by your health care provider. Make sure you discuss any questions you have with  your health care provider.

## 2015-02-11 ENCOUNTER — Ambulatory Visit
Admission: EM | Admit: 2015-02-11 | Discharge: 2015-02-11 | Disposition: A | Discharge status: Home / Self Care | Payer: BLUE CROSS/BLUE SHIELD

## 2015-02-11 NOTE — ED Notes (Signed)
Wound is clean dry and intact, sutures removed with no complication, area cleaned and redressed with a non-adherent dressing, patient discharged.

## 2015-02-11 NOTE — ED Notes (Signed)
Patient is here  For removal of stitches in right leg

## 2015-02-26 ENCOUNTER — Ambulatory Visit: Payer: BLUE CROSS/BLUE SHIELD | Admitting: Podiatry

## 2015-02-28 ENCOUNTER — Ambulatory Visit (INDEPENDENT_AMBULATORY_CARE_PROVIDER_SITE_OTHER): Payer: BLUE CROSS/BLUE SHIELD | Admitting: Podiatry

## 2015-02-28 ENCOUNTER — Encounter: Payer: Self-pay | Admitting: Podiatry

## 2015-02-28 VITALS — BP 152/70 | HR 69 | Resp 12

## 2015-02-28 DIAGNOSIS — L97521 Non-pressure chronic ulcer of other part of left foot limited to breakdown of skin: Secondary | ICD-10-CM

## 2015-02-28 NOTE — Patient Instructions (Signed)
Monitor for any signs/symptoms of infection. Call the office immediately if any occur or go directly to the emergency room. Call with any questions/concerns.  

## 2015-03-02 ENCOUNTER — Encounter: Payer: Self-pay | Admitting: Podiatry

## 2015-03-02 NOTE — Progress Notes (Signed)
Patient ID: John Reilly, male   DOB: July 31, 1955, 59 y.o.   MRN: [id number]  Subjective: Patient presented the office they for follow-up evaluation of an ulceration to her left big toe. He states he continues to Silvadene dressing changes daily. He doesn't the area uncovered at night. He denies any surrounding redness or red streaks. He gets some occasional bloody drainage after standing all day. Denies any pus. No other complaints at this time in no acute changes. He denies any systemic complaints such as fevers, chills, nausea, vomiting. No calf pain, chest pain, shortness of breath.  Objective: AAO 3, NAD DP/PT pulses 2/4, CRT less than 3 seconds On the plantar aspect the left hallux is continued hyperkeratotic lesion with underlying ulceration. Upon debridement the ulceration today appears to be very superficial appears to be almost an abrasion type wound, and the smaller than last appointment. There is a central area of pinpoint opening which has a small amount of bloody drainage have there is no purulence expressed. There is no swelling erythema, ascending cellulitis. The hallux is a plantar flexed position for when the patient states that he broke his toe. Range of motion is limited to the first MTPJ and dorsiflexion. No other open lesions or pre-ulcer lesions identified bilaterally. No other areas of tenderness. There is no pain with calf compression, swelling, warmth, erythema.  Assessment: Follow-up evaluation left hallux ulceration, improving  Plan: 1 was debrided without complications all hyperkeratotic tissue. The wound appears to be healing is very superficial. Continue Silvadene dressing changes in the day however he can leave the area uncovered when not wearing shoes and socks at home. Continue with offloading pads. Monitor for any clinical signs or symptoms of infection and directed to call the office immediately should any occur or go to the ER. Follow-up in 3 weeks or sooner if any  problems are to arise. Call any questions or concerns in the meantime.  Celesta Gentile, DPM

## 2015-03-15 ENCOUNTER — Encounter: Payer: Self-pay | Admitting: *Deleted

## 2015-03-15 ENCOUNTER — Inpatient Hospital Stay
Admission: EM | Admit: 2015-03-15 | Discharge: 2015-03-16 | DRG: 641 | Disposition: A | Discharge status: Home / Self Care | Payer: BLUE CROSS/BLUE SHIELD | Attending: Internal Medicine | Admitting: Internal Medicine

## 2015-03-15 DIAGNOSIS — E119 Type 2 diabetes mellitus without complications: Secondary | ICD-10-CM | POA: Diagnosis present

## 2015-03-15 DIAGNOSIS — K219 Gastro-esophageal reflux disease without esophagitis: Secondary | ICD-10-CM | POA: Diagnosis present

## 2015-03-15 DIAGNOSIS — Z794 Long term (current) use of insulin: Secondary | ICD-10-CM | POA: Diagnosis not present

## 2015-03-15 DIAGNOSIS — R509 Fever, unspecified: Secondary | ICD-10-CM | POA: Diagnosis present

## 2015-03-15 DIAGNOSIS — E8770 Fluid overload, unspecified: Secondary | ICD-10-CM | POA: Diagnosis present

## 2015-03-15 DIAGNOSIS — Z79899 Other long term (current) drug therapy: Secondary | ICD-10-CM | POA: Diagnosis not present

## 2015-03-15 DIAGNOSIS — I10 Essential (primary) hypertension: Secondary | ICD-10-CM | POA: Diagnosis present

## 2015-03-15 DIAGNOSIS — E871 Hypo-osmolality and hyponatremia: Secondary | ICD-10-CM | POA: Diagnosis not present

## 2015-03-15 DIAGNOSIS — Z880 Allergy status to penicillin: Secondary | ICD-10-CM

## 2015-03-15 DIAGNOSIS — R110 Nausea: Secondary | ICD-10-CM

## 2015-03-15 LAB — GLUCOSE, CAPILLARY
GLUCOSE-CAPILLARY: 290 mg/dL — AB (ref 65–99)
Glucose-Capillary: 265 mg/dL — ABNORMAL HIGH (ref 65–99)

## 2015-03-15 LAB — URINALYSIS COMPLETE WITH MICROSCOPIC (ARMC ONLY)
BILIRUBIN URINE: NEGATIVE
Bacteria, UA: NONE SEEN
Leukocytes, UA: NEGATIVE
Nitrite: NEGATIVE
Protein, ur: >500 mg/dL — AB
SQUAMOUS EPITHELIAL / LPF: NONE SEEN
Specific Gravity, Urine: 1.025 (ref 1.005–1.030)
pH: 6 (ref 5.0–8.0)

## 2015-03-15 LAB — COMPREHENSIVE METABOLIC PANEL
ALT: 19 U/L (ref 17–63)
AST: 33 U/L (ref 15–41)
Albumin: 3.4 g/dL — ABNORMAL LOW (ref 3.5–5.0)
Alkaline Phosphatase: 79 U/L (ref 38–126)
Anion gap: 11 (ref 5–15)
BUN: 15 mg/dL (ref 6–20)
CHLORIDE: 84 mmol/L — AB (ref 101–111)
CO2: 23 mmol/L (ref 22–32)
CREATININE: 1.04 mg/dL (ref 0.61–1.24)
Calcium: 8.4 mg/dL — ABNORMAL LOW (ref 8.9–10.3)
GFR calc Af Amer: >60 mL/min (ref >60)
Glucose, Bld: 267 mg/dL — ABNORMAL HIGH (ref 65–99)
Potassium: 4.5 mmol/L (ref 3.5–5.1)
SODIUM: 118 mmol/L — AB (ref 135–145)
Total Bilirubin: 0.7 mg/dL (ref 0.3–1.2)
Total Protein: 7.4 g/dL (ref 6.5–8.1)

## 2015-03-15 LAB — BLOOD GAS, VENOUS
ACID-BASE EXCESS: 0.7 mmol/L (ref 0.0–3.0)
Bicarbonate: 25.4 mEq/L (ref 21.0–28.0)
PATIENT TEMPERATURE: 37
PCO2 VEN: 40 mmHg — AB (ref 44.0–60.0)
PH VEN: 7.41 (ref 7.320–7.430)

## 2015-03-15 LAB — CBC
HCT: 33.2 % — ABNORMAL LOW (ref 40.0–52.0)
Hemoglobin: 11.6 g/dL — ABNORMAL LOW (ref 13.0–18.0)
MCH: 33.3 pg (ref 26.0–34.0)
MCHC: 34.8 g/dL (ref 32.0–36.0)
MCV: 95.7 fL (ref 80.0–100.0)
PLATELETS: 240 10*3/uL (ref 150–440)
RBC: 3.47 MIL/uL — ABNORMAL LOW (ref 4.40–5.90)
RDW: 13.6 % (ref 11.5–14.5)
WBC: 6.7 10*3/uL (ref 3.8–10.6)

## 2015-03-15 LAB — TROPONIN I: Troponin I: 0.03 ng/mL (ref <0.031)

## 2015-03-15 LAB — SODIUM, URINE, RANDOM: Sodium, Ur: <10 mmol/L

## 2015-03-15 LAB — LIPASE, BLOOD: LIPASE: 22 U/L (ref 11–51)

## 2015-03-15 LAB — OSMOLALITY, URINE: Osmolality, Ur: 729 mOsm/kg (ref 300–900)

## 2015-03-15 MED ORDER — FOLIC ACID 1 MG PO TABS
1.0000 mg | ORAL_TABLET | Freq: Every day | ORAL | Status: DC
  Administered 2015-03-16: 1 mg via ORAL
  Filled 2015-03-15: qty 1

## 2015-03-15 MED ORDER — METOPROLOL SUCCINATE ER 50 MG PO TB24
50.0000 mg | ORAL_TABLET | Freq: Every day | ORAL | Status: DC
  Administered 2015-03-16: 50 mg via ORAL
  Filled 2015-03-15: qty 1

## 2015-03-15 MED ORDER — ADULT MULTIVITAMIN W/MINERALS CH
1.0000 | ORAL_TABLET | Freq: Every day | ORAL | Status: DC
Start: 2015-03-16 — End: 2015-03-16
  Administered 2015-03-16: 1 via ORAL
  Filled 2015-03-15: qty 1

## 2015-03-15 MED ORDER — PANTOPRAZOLE SODIUM 40 MG PO TBEC
40.0000 mg | DELAYED_RELEASE_TABLET | Freq: Every day | ORAL | Status: DC
  Administered 2015-03-16: 40 mg via ORAL
  Filled 2015-03-15: qty 1

## 2015-03-15 MED ORDER — ACETAMINOPHEN 650 MG RE SUPP: 650.0000 mg | Freq: Four times a day (QID) | RECTAL | Status: DC | PRN

## 2015-03-15 MED ORDER — SODIUM CHLORIDE 0.9 % IV SOLN: INTRAVENOUS | Status: DC

## 2015-03-15 MED ORDER — FUROSEMIDE 10 MG/ML IJ SOLN
40.0000 mg | Freq: Once | INTRAMUSCULAR | Status: AC
  Administered 2015-03-15: 40 mg via INTRAVENOUS
  Filled 2015-03-15: qty 4

## 2015-03-15 MED ORDER — ONDANSETRON HCL 4 MG PO TABS: 4.0000 mg | ORAL_TABLET | Freq: Four times a day (QID) | ORAL | Status: DC | PRN

## 2015-03-15 MED ORDER — MORPHINE SULFATE (PF) 2 MG/ML IV SOLN: 2.0000 mg | INTRAVENOUS | Status: DC | PRN

## 2015-03-15 MED ORDER — SODIUM CHLORIDE 0.9 % IV BOLUS (SEPSIS): 1000.0000 mL | Freq: Once | INTRAVENOUS | Status: DC

## 2015-03-15 MED ORDER — SODIUM CHLORIDE 0.9 % IV BOLUS (SEPSIS)
1000.0000 mL | Freq: Once | INTRAVENOUS | Status: AC
Start: 2015-03-15 — End: 2015-03-15
  Administered 2015-03-15: 1000 mL via INTRAVENOUS

## 2015-03-15 MED ORDER — ONDANSETRON HCL 4 MG/2ML IJ SOLN: 4.0000 mg | Freq: Four times a day (QID) | INTRAMUSCULAR | Status: DC | PRN

## 2015-03-15 MED ORDER — ONDANSETRON HCL 4 MG/2ML IJ SOLN
4.0000 mg | Freq: Once | INTRAMUSCULAR | Status: AC
  Administered 2015-03-15: 4 mg via INTRAVENOUS
  Filled 2015-03-15: qty 2

## 2015-03-15 MED ORDER — OXYCODONE HCL 5 MG PO TABS: 5.0000 mg | ORAL_TABLET | ORAL | Status: DC | PRN

## 2015-03-15 MED ORDER — HEPARIN SODIUM (PORCINE) 5000 UNIT/ML IJ SOLN
5000.0000 [IU] | Freq: Three times a day (TID) | INTRAMUSCULAR | Status: DC
  Administered 2015-03-15 – 2015-03-16 (×2): 5000 [IU] via SUBCUTANEOUS
  Filled 2015-03-15 (×2): qty 1

## 2015-03-15 MED ORDER — AMLODIPINE BESYLATE 10 MG PO TABS
10.0000 mg | ORAL_TABLET | Freq: Every day | ORAL | Status: DC
  Administered 2015-03-16: 10 mg via ORAL
  Filled 2015-03-15: qty 1

## 2015-03-15 MED ORDER — INSULIN PUMP
Freq: Three times a day (TID) | SUBCUTANEOUS | Status: DC
  Administered 2015-03-16 (×2): via SUBCUTANEOUS
  Filled 2015-03-15: qty 1

## 2015-03-15 MED ORDER — KETOROLAC TROMETHAMINE 30 MG/ML IJ SOLN
30.0000 mg | Freq: Once | INTRAMUSCULAR | Status: AC
Start: 2015-03-15 — End: 2015-03-15
  Administered 2015-03-15: 30 mg via INTRAVENOUS
  Filled 2015-03-15: qty 1

## 2015-03-15 MED ORDER — ENALAPRIL MALEATE 10 MG PO TABS
20.0000 mg | ORAL_TABLET | Freq: Two times a day (BID) | ORAL | Status: DC
  Administered 2015-03-16: 20 mg via ORAL
  Filled 2015-03-15: qty 2

## 2015-03-15 MED ORDER — ACETAMINOPHEN 325 MG PO TABS: 650.0000 mg | ORAL_TABLET | Freq: Four times a day (QID) | ORAL | Status: DC | PRN

## 2015-03-15 NOTE — Progress Notes (Signed)
Patient questioned why he did not having fluids running. He states he thought MD Hower wanted fluids. MD called to verify. MD states that he did not want fluids infusing for this patient

## 2015-03-15 NOTE — H&P (Signed)
[place] [facility] [place] NAME: John Reilly    MR#:  [id number]  DATE OF BIRTH:  Oct 30, 1955   DATE OF ADMISSION:  03/15/2015  PRIMARY CARE PHYSICIAN: Glendon Axe, MD   REQUESTING/REFERRING PHYSICIAN: gayle  CHIEF COMPLAINT:   Chief Complaint  Patient presents with  . Fever  nausea  HISTORY OF PRESENT ILLNESS:  John Reilly  is a 59 y.o. male with a known history of type 2 diabetes on insulin pump presenting with nausea. He describes 1 day duration of nausea without emesis, associated upper respiratory symptoms. He also complains of edema somewhat worse than usual. No dyspnea on exertion, chest pain, orthopnea. In Emergency department noted to have hyponatremia - noted chronic hyponatremia baseline around 125  PAST MEDICAL HISTORY:   Past Medical History  Diagnosis Date  . Diabetes mellitus without complication ([place])   . Hypertension   . Cancer ([place])   . GERD (gastroesophageal reflux disease)     PAST SURGICAL HISTORY:   Past Surgical History  Procedure Laterality Date  . Ankle arthroplasty      2013    SOCIAL HISTORY:   Social History  Substance Use Topics  . Smoking status: Former Research scientist (life sciences)  . Smokeless tobacco: Never Used  . Alcohol Use: 0.0 oz/week    0 Standard drinks or equivalent per week     Comment: 6 beers per day    FAMILY HISTORY:   Family History  Problem Relation Age of Onset  . Stroke Mother   . Alzheimer's disease Father   . Diabetes Brother     DRUG ALLERGIES:   Allergies  Allergen Reactions  . Penicillins Other (See Comments)    Reaction:  Unknown; childhood reaction     REVIEW OF SYSTEMS:  REVIEW OF SYSTEMS:  CONSTITUTIONAL: Denies fevers, chills, fatigue, weakness.  EYES: Denies blurred vision, double vision, or eye pain.  EARS, NOSE, THROAT: Denies tinnitus, ear pain, hearing loss.  RESPIRATORY: denies cough, shortness of breath, wheezing  CARDIOVASCULAR: Denies chest pain,  palpitations, edema.  GASTROINTESTINAL: Positive nausea, denies vomiting, diarrhea, abdominal pain.  GENITOURINARY: Denies dysuria, hematuria.  ENDOCRINE: Denies nocturia or thyroid problems. HEMATOLOGIC AND LYMPHATIC: Denies easy bruising or bleeding.  SKIN: Denies rash or lesions.  MUSCULOSKELETAL: Denies pain in neck, back, shoulder, knees, hips, or further arthritic symptoms.  NEUROLOGIC: Denies paralysis, paresthesias.  PSYCHIATRIC: Denies anxiety or depressive symptoms. Otherwise full review of systems performed by me is negative.   MEDICATIONS AT HOME:   Prior to Admission medications   Medication Sig Start Date End Date Taking? Authorizing Provider  amLODipine (NORVASC) 10 MG tablet Take 10 mg by mouth daily.   Yes Historical Provider, MD  enalapril (VASOTEC) 20 MG tablet Take 20 mg by mouth 2 (two) times daily.    Yes Historical Provider, MD  folic acid (FOLVITE) 1 MG tablet Take 1 mg by mouth daily.   Yes Historical Provider, MD  glucagon 1 MG injection 1 mg by Other route once as needed (for severe hypoglycemia).    Yes Historical Provider, MD  Insulin Human (INSULIN PUMP) SOLN Pt uses Humalog.   Yes Historical Provider, MD  metoprolol succinate (TOPROL-XL) 50 MG 24 hr tablet Take 50 mg by mouth daily.   Yes Historical Provider, MD  Multiple Vitamin (MULTIVITAMIN WITH MINERALS) TABS tablet Take 1 tablet by mouth daily.   Yes Historical Provider, MD  omeprazole (PRILOSEC) 40 MG capsule Take 40 mg by mouth daily.   Yes  Historical Provider, MD  HYDROcodone-acetaminophen (NORCO/VICODIN) 5-325 MG per tablet Take 1 tablet by mouth every 6 (six) hours as needed. Patient not taking: Reported on 03/15/2015 12/18/14   Trula Slade, DPM  oxyCODONE-acetaminophen (ROXICET) 5-325 MG per tablet Take 1 tablet by mouth every 8 (eight) hours as needed for moderate pain or severe pain (Do not drive or operate heavy machinery while taking as can cause drowsiness.). Patient not taking: Reported on  03/15/2015 02/01/15   Marylene Land, NP  silver sulfADIAZINE (SILVADENE) 1 % cream Apply 1 application topically daily. Patient not taking: Reported on 03/15/2015 11/27/14   Trula Slade, DPM      VITAL SIGNS:  Blood pressure 139/55, pulse 85, temperature 100.5 F (38.1 C), temperature source Oral, resp. rate 28, height 5\' 10"  (1.778 m), weight 196 lb (88.905 kg), SpO2 99 %.  PHYSICAL EXAMINATION:  VITAL SIGNS: Filed Vitals:   03/15/15 2030  BP: 139/55  Pulse: 85  Temp:   Resp: 28   GENERAL:59 y.o.male currently in no acute distress.  HEAD: Normocephalic, atraumatic.  EYES: Pupils equal, round, reactive to light. Extraocular muscles intact. No scleral icterus.  MOUTH: Moist mucosal membrane. Dentition intact. No abscess noted.  EAR, NOSE, THROAT: Clear without exudates. No external lesions.  NECK: Supple. No thyromegaly. No nodules. No JVD.  PULMONARY: Clear to ascultation, without wheeze rails or rhonci. No use of accessory muscles, Good respiratory effort. good air entry bilaterally CHEST: Nontender to palpation.  CARDIOVASCULAR: S1 and S2. Regular rate and rhythm. No murmurs, rubs, or gallops. 3+ lower extremity edema. Pedal pulses 2+ bilaterally.  GASTROINTESTINAL: Soft, nontender, nondistended. No masses. Positive bowel sounds. No hepatosplenomegaly.  MUSCULOSKELETAL: No swelling, clubbing, or edema. Range of motion full in all extremities.  NEUROLOGIC: Cranial nerves II through XII are intact. No gross focal neurological deficits. Sensation intact. Reflexes intact.  SKIN: No ulceration, lesions, rashes, or cyanosis. Skin warm and dry. Turgor intact.  PSYCHIATRIC: Mood, affect within normal limits. The patient is awake, alert and oriented x 3. Insight, judgment intact.    LABORATORY PANEL:   CBC  Recent Labs Lab 03/15/15 1845  WBC 6.7  HGB 11.6*  HCT 33.2*  PLT 240    ------------------------------------------------------------------------------------------------------------------  Chemistries   Recent Labs Lab 03/15/15 1845  NA 118*  K 4.5  CL 84*  CO2 23  GLUCOSE 267*  BUN 15  CREATININE 1.04  CALCIUM 8.4*  AST 33  ALT 19  ALKPHOS 79  BILITOT 0.7   ------------------------------------------------------------------------------------------------------------------  Cardiac Enzymes  Recent Labs Lab 03/14/15 2030  TROPONINI 0.03   ------------------------------------------------------------------------------------------------------------------  RADIOLOGY:  No results found.  EKG:   Orders placed or performed during the hospital encounter of 03/15/15  . EKG 12-Lead  . EKG 12-Lead  . ED EKG  . ED EKG    IMPRESSION AND PLAN:   59 year old Caucasian gentleman history of type 2 diabetes insulin requiring presenting with nausea found to be hyponatremic  1. Hyponatremia, volume overloaded given and edema on exam. He has chronic hyponatremia however he is lower than baseline, I suspect this is due to worsening edema secondary to proteinuria. We will diuresis with Lasix, follow sodium level II. Type 2 diabetes insulin requiring: Continue with Accu-Cheks as well as insulin pump 3. Essential hypertension: Norvasc, enalapril 4. GERD without esophagitis PPI therapy 5. Venous thromboembolism prophylactic: Heparin subcutaneous    All the records are reviewed and case discussed with ED provider. Management plans discussed with the patient, family and they are in  agreement.  CODE STATUS: Full  TOTAL TIME TAKING CARE OF THIS PATIENT: 35 minutes.    Darcy Barbara,  Karenann Cai.D on 03/15/2015 at 9:36 PM  Between 7am to 6pm - Pager - [phone]  After 6pm: House Pager: - [phone]  [facility]  Office  [phone]  CC: Primary care physician; Glendon Axe, MD

## 2015-03-15 NOTE — ED Provider Notes (Signed)
[facility] Emergency Department Provider Note  ____________________________________________  Time seen: Approximately 7:29 PM  I have reviewed the triage vital signs and the nursing notes.   HISTORY  Chief Complaint Fever    HPI John Reilly is a 59 y.o. male type 1 diabetes and hypertension who presents for evaluation of gradual onset nausea with dry heaves, ongoing since yesterday, constant, worsening. No vomiting but is belching. No diarrhea fevers or chills. No abdominal pain. No chest pain or difficulty breathing. No modifying factors. He also reports his blood sugars have been running somewhat high in the 260s and this is abnormal for him. He has not had similar symptoms.   Past Medical History  Diagnosis Date  . Diabetes mellitus without complication ([employer])   . Hypertension   . Cancer ([place])   . GERD (gastroesophageal reflux disease)     There are no active problems to display for this patient.   Past Surgical History  Procedure Laterality Date  . Ankle arthroplasty      2013    Current Outpatient Rx  Name  Route  Sig  Dispense  Refill  . amLODipine (NORVASC) 10 MG tablet   Oral   Take 10 mg by mouth daily.         . enalapril (VASOTEC) 20 MG tablet   Oral   Take 20 mg by mouth 2 (two) times daily.          . folic acid (FOLVITE) 1 MG tablet   Oral   Take 1 mg by mouth daily.         Marland Kitchen glucagon 1 MG injection   Other   1 mg by Other route once as needed (for severe hypoglycemia).          . Insulin Human (INSULIN PUMP) SOLN      Pt uses Humalog.         . metoprolol succinate (TOPROL-XL) 50 MG 24 hr tablet   Oral   Take 50 mg by mouth daily.         . Multiple Vitamin (MULTIVITAMIN WITH MINERALS) TABS tablet   Oral   Take 1 tablet by mouth daily.         Marland Kitchen omeprazole (PRILOSEC) 40 MG capsule   Oral   Take 40 mg by mouth daily.         Marland Kitchen HYDROcodone-acetaminophen (NORCO/VICODIN) 5-325 MG per tablet    Oral   Take 1 tablet by mouth every 6 (six) hours as needed. Patient not taking: Reported on 03/15/2015   20 tablet   0   . oxyCODONE-acetaminophen (ROXICET) 5-325 MG per tablet   Oral   Take 1 tablet by mouth every 8 (eight) hours as needed for moderate pain or severe pain (Do not drive or operate heavy machinery while taking as can cause drowsiness.). Patient not taking: Reported on 03/15/2015   9 tablet   0   . silver sulfADIAZINE (SILVADENE) 1 % cream   Topical   Apply 1 application topically daily. Patient not taking: Reported on 03/15/2015   50 g   0     Allergies Penicillins  Family History  Problem Relation Age of Onset  . Stroke Mother   . Alzheimer's disease Father   . Diabetes Brother     Social History Social History  Substance Use Topics  . Smoking status: Former Research scientist (life sciences)  . Smokeless tobacco: Never Used  . Alcohol Use: 0.0 oz/week    0 Standard  drinks or equivalent per week     Comment: 6 beers per day    Review of Systems Constitutional: No fever/chills Eyes: No visual changes. ENT: No sore throat. Cardiovascular: Denies chest pain. Respiratory: Denies shortness of breath. Gastrointestinal: No abdominal pain.  + nausea, no vomiting.  No diarrhea.  No constipation. Genitourinary: Negative for dysuria. Musculoskeletal: Negative for back pain. Skin: Negative for rash. Neurological: Negative for headaches, focal weakness or numbness.  10-point ROS otherwise negative.  ____________________________________________   PHYSICAL EXAM:  VITAL SIGNS: ED Triage Vitals  Enc Vitals Group     BP 03/15/15 1846 140/58 mmHg     Pulse Rate 03/15/15 1846 91     Resp 03/15/15 1846 20     Temp 03/15/15 1846 100.5 F (38.1 C)     Temp Source 03/15/15 1846 Oral     SpO2 03/15/15 1846 100 %     Weight 03/15/15 1846 196 lb (88.905 kg)     Height 03/15/15 1846 5\' 10"  (1.778 m)     Head Cir --      Peak Flow --      Pain Score 03/15/15 1840 0     Pain Loc --       Pain Edu? --      Excl. in [place]? --     Constitutional: Alert and oriented. Well appearing and in no acute distress. Eyes: Conjunctivae are normal. PERRL. EOMI. Head: Atraumatic. Nose: No congestion/rhinnorhea. Mouth/Throat: Mucous membranes are moist.  Oropharynx non-erythematous. Neck: No stridor.   Cardiovascular: Normal rate, regular rhythm. Grossly normal heart sounds.  Good peripheral circulation. Respiratory: Normal respiratory effort.  No retractions. Lungs CTAB. Gastrointestinal: Soft and nontender. No distention. No abdominal bruits. No CVA tenderness. Genitourinary: deferred Musculoskeletal: No lower extremity tenderness nor edema.  No joint effusions. Neurologic:  Normal speech and language. No gross focal neurologic deficits are appreciated. No gait instability. Skin:  Skin is warm, dry and intact. No rash noted. Psychiatric: Mood and affect are normal. Speech and behavior are normal.  ____________________________________________   LABS (all labs ordered are listed, but only abnormal results are displayed)  Labs Reviewed  COMPREHENSIVE METABOLIC PANEL - Abnormal; Notable for the following:    Sodium 118 (*)    Chloride 84 (*)    Glucose, Bld 267 (*)    Calcium 8.4 (*)    Albumin 3.4 (*)    All other components within normal limits  CBC - Abnormal; Notable for the following:    RBC 3.47 (*)    Hemoglobin 11.6 (*)    HCT 33.2 (*)    All other components within normal limits  URINALYSIS COMPLETEWITH MICROSCOPIC ([facility] ONLY) - Abnormal; Notable for the following:    Color, Urine YELLOW (*)    APPearance CLEAR (*)    Glucose, UA >500 (*)    Ketones, ur 1+ (*)    Hgb urine dipstick 1+ (*)    Protein, ur >500 (*)    All other components within normal limits  GLUCOSE, CAPILLARY - Abnormal; Notable for the following:    Glucose-Capillary 265 (*)    All other components within normal limits  BLOOD GAS, VENOUS - Abnormal; Notable for the following:    pCO2, Ven  40 (*)    All other components within normal limits  CULTURE, BLOOD (ROUTINE X 2)  CULTURE, BLOOD (ROUTINE X 2)  LIPASE, BLOOD  TROPONIN I  CBG MONITORING, ED   ____________________________________________  EKG  ED ECG REPORT I, Loura Pardon  A, the attending physician, personally viewed and interpreted this ECG.   Date: 03/15/2015  EKG Time: 19:58  Rate: 87  Rhythm: normal sinus rhythm  Axis: left  Intervals:none  ST&T Change: Discordant ST elevation in V1, V2 unchanged from EKG in 2009 which was obtained at [place]  ____________________________________________  RADIOLOGY  none ____________________________________________   PROCEDURES  Procedure(s) performed: None  Critical Care performed: Yes, see critical care note(s). Total critical care time spent 30 minutes.  ____________________________________________   INITIAL IMPRESSION / ASSESSMENT AND PLAN / ED COURSE  Pertinent labs & imaging results that were available during my care of the patient were reviewed by me and considered in my medical decision making (see chart for details).  John Reilly is a 59 y.o. male type 1 diabetes and hypertension who presents for evaluation of gradual onset nausea with dry heaves. On exam, he is generally well-appearing and in no acute distress. He is febrile with a temp of 100.5 but vital signs otherwise stable, he has a benign exam, he has no pain complaints. Screening EKG was obtained which was concerning for ST elevation in lead V1 and V2. I discussed this with the [place] cardiology fellow, Dr. Primitivo Gauze for activation of possible code STEMI however he is reviewed the patient's EKG and compared it to the prior EKG in 2009 reports that there is no change. As the patient is not having any symptoms currently, will repeat EKG and obtain serial troponins and will not call code STEMI. Screening labs pending, reassess for disposition.  ----------------------------------------- 9:09 PM on  03/15/2015 ----------------------------------------- Labs reviewed. Patient is noted to be profoundly hyponatremic with sodium of 118. Mild anemia with hemoglobin of 11.8. Glucose 267, normal bicarbonate, normal anion gap, not consistent with DKA. Urinalysis with several red blood cells but not consistent with infection. Troponin negative. We'll give IV fluids and admit to hospitalist. Patient has no abdominal pain, no tenderness, no upper respiratory infection type symptoms, and there is no indication for advanced imaging. Case discussed with Dr. Lavetta Nielsen, hospitalist, for admission at this time. Will give IV fluids.  ____________________________________________   FINAL CLINICAL IMPRESSION(S) / ED DIAGNOSES  Final diagnoses:  Acute hyponatremia  Nausea      Joanne Gavel, MD 03/15/15 2111

## 2015-03-15 NOTE — ED Notes (Signed)
MD at bedside. 

## 2015-03-15 NOTE — ED Notes (Signed)
Pt reports fever, N/V, hyperglycemia x 2 days.

## 2015-03-16 LAB — BASIC METABOLIC PANEL
Anion gap: 5 (ref 5–15)
BUN: 17 mg/dL (ref 6–20)
CHLORIDE: 90 mmol/L — AB (ref 101–111)
CO2: 28 mmol/L (ref 22–32)
Calcium: 7.9 mg/dL — ABNORMAL LOW (ref 8.9–10.3)
Creatinine, Ser: 0.98 mg/dL (ref 0.61–1.24)
Glucose, Bld: 206 mg/dL — ABNORMAL HIGH (ref 65–99)
POTASSIUM: 4.4 mmol/L (ref 3.5–5.1)
SODIUM: 123 mmol/L — AB (ref 135–145)

## 2015-03-16 MED ORDER — FUROSEMIDE 20 MG PO TABS: 20.0000 mg | ORAL_TABLET | Freq: Two times a day (BID) | ORAL | Status: DC

## 2015-03-16 MED ORDER — CEFUROXIME AXETIL 500 MG PO TABS: 500.0000 mg | ORAL_TABLET | Freq: Two times a day (BID) | ORAL | Status: DC

## 2015-03-16 NOTE — Progress Notes (Signed)
[place] [facility] [place] was admitted to the [facility] on 03/15/2015 and Discharged  03/16/2015 and should be excused from work/school   for 2 days starting 03/15/2015 , may return to work/school without any restrictions.  Call Dustin Flock MD with questions.  Dustin Flock M.D on 03/16/2015,at 1:43 PM  [place] [facility] [place]

## 2015-03-16 NOTE — Progress Notes (Signed)
Patient discharging home. Instructions and prescriptions given to patient, verbalized understanding. Wife will provide transportation at 3pm.

## 2015-03-16 NOTE — Discharge Instructions (Signed)
°  DIET:  °Diabetic diet ° °DISCHARGE CONDITION:  °Stable ° °ACTIVITY:  °Activity as tolerated ° °OXYGEN:  °Home Oxygen: No. °  °Oxygen Delivery: room air ° °DISCHARGE LOCATION:  °home  ° ° °ADDITIONAL DISCHARGE INSTRUCTION: ° ° °If you experience worsening of your admission symptoms, develop shortness of breath, life threatening emergency, suicidal or homicidal thoughts you must seek medical attention immediately by calling 911 or calling your MD immediately  if symptoms less severe. ° °You Must read complete instructions/literature along with all the possible adverse reactions/side effects for all the Medicines you take and that have been prescribed to you. Take any new Medicines after you have completely understood and accpet all the possible adverse reactions/side effects.  ° °Please note ° °You were cared for by a hospitalist during your hospital stay. If you have any questions about your discharge medications or the care you received while you were in the hospital after you are discharged, you can call the unit and asked to speak with the hospitalist on call if the hospitalist that took care of you is not available. Once you are discharged, your primary care physician will handle any further medical issues. Please note that NO REFILLS for any discharge medications will be authorized once you are discharged, as it is imperative that you return to your primary care physician (or establish a relationship with a primary care physician if you do not have one) for your aftercare needs so that they can reassess your need for medications and monitor your lab values. ° ° °

## 2015-03-16 NOTE — Discharge Summary (Signed)
John Reilly, 59 y.o., DOB August 05, 1955, MRN [id number]. Admission date: 03/15/2015 Discharge Date 03/16/2015 Primary MD Glendon Axe, MD Admitting Physician Lytle Butte, MD  Admission Diagnosis  Acute hyponatremia [E87.1] Nausea [R11.0]  Discharge Diagnosis   Active Problems:   Hyponatremia  diabetes type 2 Fever Hypertension GERD Cancer      Hospital Course  John Reilly is a 59 y.o. male with a known history of type 2 diabetes on insulin pump presenting with nausea. Patient has chronic hyponatremia and a sodium around 123-125. He came to the emergency room with complaint of some fever up respiratory symptoms. His sodium was noted to be very low at 118. He was admitted to the hospital for further evaluation and treatment. He was thought to have fluid overload and was given low dose of Lasix. His sodium is 123 which is close to his baseline and he is interested in going home. His fevers have resolved. Certainly could be due to acute bronchitis. He's been being treated with oral cephalosporin. At this time is doing well and stable for discharge. Patient will need to follow-up with his nephrologist on Thursday which he ordered he has an follow-up appointment at that time sodium level will need to be checked.          Consults  None  Significant Tests:  See full reports for all details    No results found.     Today   Subjective:   John Reilly  feels well very anxious to go home  Objective:   Blood pressure 123/63, pulse 81, temperature 98.6 F (37 C), temperature source Oral, resp. rate 18, height 5\' 10"  (1.778 m), weight 89.676 kg (197 lb 11.2 oz), SpO2 99 %.  .  Intake/Output Summary (Last 24 hours) at 03/16/15 1344 Last data filed at 03/16/15 1119  Gross per 24 hour  Intake    240 ml  Output      0 ml  Net    240 ml    Exam VITAL SIGNS: Blood pressure 123/63, pulse 81, temperature 98.6 F (37 C), temperature source Oral, resp. rate 18, height 5\' 10"  (1.778  m), weight 89.676 kg (197 lb 11.2 oz), SpO2 99 %.  GENERAL:  59 y.o.-year-old patient lying in the bed with no acute distress.  EYES: Pupils equal, round, reactive to light and accommodation. No scleral icterus. Extraocular muscles intact.  HEENT: Head atraumatic, normocephalic. Oropharynx and nasopharynx clear.  NECK:  Supple, no jugular venous distention. No thyroid enlargement, no tenderness.  LUNGS: Normal breath sounds bilaterally, no wheezing, rales,rhonchi or crepitation. No use of accessory muscles of respiration.  CARDIOVASCULAR: S1, S2 normal. No murmurs, rubs, or gallops.  ABDOMEN: Soft, nontender, nondistended. Bowel sounds present. No organomegaly or mass.  EXTREMITIES: No pedal edema, cyanosis, or clubbing. 1+ edema  NEUROLOGIC: Cranial nerves II through XII are intact. Muscle strength 5/5 in all extremities. Sensation intact. Gait not checked.  PSYCHIATRIC: The patient is alert and oriented x 3.  SKIN: No obvious rash, lesion, or ulcer.   Data Review     CBC w Diff: Lab Results  Component Value Date   WBC 6.7 03/15/2015   HGB 11.6* 03/15/2015   HCT 33.2* 03/15/2015   PLT 240 03/15/2015   CMP: Lab Results  Component Value Date   NA 123* 03/16/2015   K 4.4 03/16/2015   CL 90* 03/16/2015   CO2 28 03/16/2015   BUN 17 03/16/2015   CREATININE 0.98 03/16/2015   PROT 7.4 03/15/2015  ALBUMIN 3.4* 03/15/2015   BILITOT 0.7 03/15/2015   ALKPHOS 79 03/15/2015   AST 33 03/15/2015   ALT 19 03/15/2015  .  Micro Results Recent Results (from the past 240 hour(s))  Blood culture (routine x 2)     Status: None (Preliminary result)   Collection Time: 03/15/15  9:45 PM  Result Value Ref Range Status   Specimen Description BLOOD RIGHT ASSIST CONTROL  Final   Special Requests BOTTLES DRAWN AEROBIC AND ANAEROBIC 5CC  Final   Culture NO GROWTH < 12 HOURS  Final   Report Status PENDING  Incomplete  Blood culture (routine x 2)     Status: None (Preliminary result)   Collection  Time: 03/15/15 10:15 PM  Result Value Ref Range Status   Specimen Description BLOOD LEFT ASSIST CONTROL  Final   Special Requests BOTTLES DRAWN AEROBIC AND ANAEROBIC 5CC  Final   Culture NO GROWTH < 12 HOURS  Final   Report Status PENDING  Incomplete        Code Status Orders        Start     Ordered   03/15/15 2115  Full code   Continuous     03/15/15 2114          Follow-up Information    Follow up with Singh,Jasmine, MD.   Specialty:  Internal Medicine   Contact information:   [address] [place] [address] [phone]       Follow up with Lavonia Dana, MD.   Specialty:  Internal Medicine   Why:  next thursday as scheduled   Contact information:   [address] [place] [address] [phone]       Discharge Medications     Medication List    TAKE these medications        amLODipine 10 MG tablet  Commonly known as:  NORVASC  Take 10 mg by mouth daily.     cefUROXime 500 MG tablet  Commonly known as:  CEFTIN  Take 1 tablet (500 mg total) by mouth 2 (two) times daily with a meal.     enalapril 20 MG tablet  Commonly known as:  VASOTEC  Take 20 mg by mouth 2 (two) times daily.     folic acid 1 MG tablet  Commonly known as:  FOLVITE  Take 1 mg by mouth daily.     furosemide 20 MG tablet  Commonly known as:  LASIX  Take 1 tablet (20 mg total) by mouth 2 (two) times daily.     glucagon 1 MG injection  1 mg by Other route once as needed (for severe hypoglycemia).     HYDROcodone-acetaminophen 5-325 MG tablet  Commonly known as:  NORCO/VICODIN  Take 1 tablet by mouth every 6 (six) hours as needed.     insulin pump Soln  Pt uses Humalog.     metoprolol succinate 50 MG 24 hr tablet  Commonly known as:  TOPROL-XL  Take 50 mg by mouth daily.     multivitamin with minerals Tabs tablet  Take 1 tablet by mouth daily.     omeprazole 40 MG capsule  Commonly known as:  PRILOSEC  Take  40 mg by mouth daily.     oxyCODONE-acetaminophen 5-325 MG tablet  Commonly known as:  ROXICET  Take 1 tablet by mouth every 8 (eight) hours as needed for moderate pain or severe pain (Do not drive or operate heavy machinery while taking as  can cause drowsiness.).     silver sulfADIAZINE 1 % cream  Commonly known as:  SILVADENE  Apply 1 application topically daily.           Total Time in preparing paper work, data evaluation and todays exam - 35 minutes  Dustin Flock M.D on 03/16/2015 at 1:44 PM  [facility]   Office  [phone]

## 2015-03-16 NOTE — Progress Notes (Signed)
Patient has own insulin pump and checks own blood sugars. Per patient, blood sugar this am was 216, patient programmed insulin pump to give 4.30 units, documented on MAR. Patient a&o, vss, no complaints at this time. Resting in bed.

## 2015-03-19 ENCOUNTER — Inpatient Hospital Stay: Payer: BLUE CROSS/BLUE SHIELD

## 2015-03-19 ENCOUNTER — Encounter: Payer: Self-pay | Admitting: Emergency Medicine

## 2015-03-19 ENCOUNTER — Inpatient Hospital Stay
Admission: EM | Admit: 2015-03-19 | Discharge: 2015-03-21 | DRG: 641 | Disposition: A | Discharge status: Home / Self Care | Payer: BLUE CROSS/BLUE SHIELD | Attending: Internal Medicine | Admitting: Internal Medicine

## 2015-03-19 DIAGNOSIS — E871 Hypo-osmolality and hyponatremia: Secondary | ICD-10-CM | POA: Diagnosis present

## 2015-03-19 DIAGNOSIS — Z823 Family history of stroke: Secondary | ICD-10-CM

## 2015-03-19 DIAGNOSIS — R600 Localized edema: Secondary | ICD-10-CM | POA: Diagnosis present

## 2015-03-19 DIAGNOSIS — Z859 Personal history of malignant neoplasm, unspecified: Secondary | ICD-10-CM | POA: Diagnosis not present

## 2015-03-19 DIAGNOSIS — I10 Essential (primary) hypertension: Secondary | ICD-10-CM | POA: Diagnosis present

## 2015-03-19 DIAGNOSIS — Z87891 Personal history of nicotine dependence: Secondary | ICD-10-CM

## 2015-03-19 DIAGNOSIS — Z880 Allergy status to penicillin: Secondary | ICD-10-CM

## 2015-03-19 DIAGNOSIS — Z794 Long term (current) use of insulin: Secondary | ICD-10-CM

## 2015-03-19 DIAGNOSIS — Z96669 Presence of unspecified artificial ankle joint: Secondary | ICD-10-CM | POA: Diagnosis present

## 2015-03-19 DIAGNOSIS — Z833 Family history of diabetes mellitus: Secondary | ICD-10-CM | POA: Diagnosis not present

## 2015-03-19 DIAGNOSIS — E870 Hyperosmolality and hypernatremia: Secondary | ICD-10-CM | POA: Diagnosis present

## 2015-03-19 DIAGNOSIS — K219 Gastro-esophageal reflux disease without esophagitis: Secondary | ICD-10-CM | POA: Diagnosis present

## 2015-03-19 DIAGNOSIS — Z820 Family history of epilepsy and other diseases of the nervous system: Secondary | ICD-10-CM

## 2015-03-19 DIAGNOSIS — E8770 Fluid overload, unspecified: Secondary | ICD-10-CM | POA: Diagnosis present

## 2015-03-19 DIAGNOSIS — D649 Anemia, unspecified: Secondary | ICD-10-CM | POA: Diagnosis present

## 2015-03-19 DIAGNOSIS — Z9641 Presence of insulin pump (external) (internal): Secondary | ICD-10-CM | POA: Diagnosis present

## 2015-03-19 DIAGNOSIS — N049 Nephrotic syndrome with unspecified morphologic changes: Secondary | ICD-10-CM | POA: Diagnosis present

## 2015-03-19 DIAGNOSIS — R509 Fever, unspecified: Secondary | ICD-10-CM

## 2015-03-19 DIAGNOSIS — Z79899 Other long term (current) drug therapy: Secondary | ICD-10-CM

## 2015-03-19 DIAGNOSIS — R609 Edema, unspecified: Secondary | ICD-10-CM

## 2015-03-19 DIAGNOSIS — E1065 Type 1 diabetes mellitus with hyperglycemia: Secondary | ICD-10-CM | POA: Diagnosis present

## 2015-03-19 LAB — CBC
HCT: 32.2 % — ABNORMAL LOW (ref 40.0–52.0)
HEMATOCRIT: 29.6 % — AB (ref 40.0–52.0)
Hemoglobin: 11 g/dL — ABNORMAL LOW (ref 13.0–18.0)
Hemoglobin: 10.2 g/dL — ABNORMAL LOW (ref 13.0–18.0)
MCH: 31.8 pg (ref 26.0–34.0)
MCH: 32.4 pg (ref 26.0–34.0)
MCHC: 34.1 g/dL (ref 32.0–36.0)
MCHC: 34.5 g/dL (ref 32.0–36.0)
MCV: 93.2 fL (ref 80.0–100.0)
MCV: 93.8 fL (ref 80.0–100.0)
PLATELETS: 257 10*3/uL (ref 150–440)
Platelets: 216 10*3/uL (ref 150–440)
RBC: 3.45 MIL/uL — AB (ref 4.40–5.90)
RBC: 3.15 MIL/uL — ABNORMAL LOW (ref 4.40–5.90)
RDW: 13.6 % (ref 11.5–14.5)
RDW: 13.5 % (ref 11.5–14.5)
WBC: 7 10*3/uL (ref 3.8–10.6)
WBC: 6.1 10*3/uL (ref 3.8–10.6)

## 2015-03-19 LAB — BASIC METABOLIC PANEL
Anion gap: 8 (ref 5–15)
BUN: 12 mg/dL (ref 6–20)
CALCIUM: 7.7 mg/dL — AB (ref 8.9–10.3)
CO2: 25 mmol/L (ref 22–32)
CREATININE: 0.79 mg/dL (ref 0.61–1.24)
Chloride: 85 mmol/L — ABNORMAL LOW (ref 101–111)
GFR calc non Af Amer: >60 mL/min (ref >60)
Glucose, Bld: 313 mg/dL — ABNORMAL HIGH (ref 65–99)
Potassium: 4.6 mmol/L (ref 3.5–5.1)
Sodium: 118 mmol/L — CL (ref 135–145)

## 2015-03-19 LAB — URINALYSIS COMPLETE WITH MICROSCOPIC (ARMC ONLY)
Bilirubin Urine: NEGATIVE
Glucose, UA: >500 mg/dL — AB
Hgb urine dipstick: NEGATIVE
Leukocytes, UA: NEGATIVE
Nitrite: NEGATIVE
PH: 7 (ref 5.0–8.0)
PROTEIN: 100 mg/dL — AB
SQUAMOUS EPITHELIAL / LPF: NONE SEEN
Specific Gravity, Urine: 1.013 (ref 1.005–1.030)

## 2015-03-19 LAB — CREATININE, SERUM
Creatinine, Ser: 0.77 mg/dL (ref 0.61–1.24)
GFR calc Af Amer: >60 mL/min (ref >60)

## 2015-03-19 LAB — TSH: TSH: 1.417 u[IU]/mL (ref 0.350–4.500)

## 2015-03-19 LAB — SODIUM: Sodium: 121 mmol/L — ABNORMAL LOW (ref 135–145)

## 2015-03-19 LAB — GLUCOSE, CAPILLARY: GLUCOSE-CAPILLARY: 304 mg/dL — AB (ref 65–99)

## 2015-03-19 MED ORDER — PANTOPRAZOLE SODIUM 40 MG PO TBEC
40.0000 mg | DELAYED_RELEASE_TABLET | Freq: Every day | ORAL | Status: DC
  Administered 2015-03-20 – 2015-03-21 (×2): 40 mg via ORAL
  Filled 2015-03-19 (×2): qty 1

## 2015-03-19 MED ORDER — ALUM & MAG HYDROXIDE-SIMETH 200-200-20 MG/5ML PO SUSP: 30.0000 mL | Freq: Four times a day (QID) | ORAL | Status: DC | PRN

## 2015-03-19 MED ORDER — FOLIC ACID 1 MG PO TABS
1.0000 mg | ORAL_TABLET | Freq: Every day | ORAL | Status: DC
Start: 2015-03-19 — End: 2015-03-21
  Administered 2015-03-20 – 2015-03-21 (×2): 1 mg via ORAL
  Filled 2015-03-19 (×2): qty 1

## 2015-03-19 MED ORDER — HYDRALAZINE HCL 25 MG PO TABS
25.0000 mg | ORAL_TABLET | Freq: Two times a day (BID) | ORAL | Status: DC
  Administered 2015-03-20 – 2015-03-21 (×3): 25 mg via ORAL
  Filled 2015-03-19 (×3): qty 1

## 2015-03-19 MED ORDER — OXYCODONE-ACETAMINOPHEN 5-325 MG PO TABS: 1.0000 | ORAL_TABLET | Freq: Three times a day (TID) | ORAL | Status: DC | PRN

## 2015-03-19 MED ORDER — ONDANSETRON HCL 4 MG PO TABS: 4.0000 mg | ORAL_TABLET | Freq: Four times a day (QID) | ORAL | Status: DC | PRN

## 2015-03-19 MED ORDER — METOPROLOL SUCCINATE ER 50 MG PO TB24
50.0000 mg | ORAL_TABLET | Freq: Every day | ORAL | Status: DC
Start: 2015-03-19 — End: 2015-03-21
  Administered 2015-03-20 – 2015-03-21 (×2): 50 mg via ORAL
  Filled 2015-03-19 (×2): qty 1

## 2015-03-19 MED ORDER — ONDANSETRON HCL 4 MG/2ML IJ SOLN
4.0000 mg | Freq: Four times a day (QID) | INTRAMUSCULAR | Status: DC | PRN
  Administered 2015-03-20: 4 mg via INTRAVENOUS
  Filled 2015-03-19: qty 2

## 2015-03-19 MED ORDER — ADULT MULTIVITAMIN W/MINERALS CH
1.0000 | ORAL_TABLET | Freq: Every day | ORAL | Status: DC
  Administered 2015-03-20 – 2015-03-21 (×2): 1 via ORAL
  Filled 2015-03-19 (×2): qty 1

## 2015-03-19 MED ORDER — ACETAMINOPHEN 325 MG PO TABS: 650.0000 mg | ORAL_TABLET | Freq: Four times a day (QID) | ORAL | Status: DC | PRN

## 2015-03-19 MED ORDER — SENNOSIDES-DOCUSATE SODIUM 8.6-50 MG PO TABS: 1.0000 | ORAL_TABLET | Freq: Every evening | ORAL | Status: DC | PRN

## 2015-03-19 MED ORDER — SODIUM CHLORIDE 0.9 % IV BOLUS (SEPSIS)
1000.0000 mL | Freq: Once | INTRAVENOUS | Status: AC
  Administered 2015-03-19: 1000 mL via INTRAVENOUS

## 2015-03-19 MED ORDER — FUROSEMIDE 10 MG/ML IJ SOLN
40.0000 mg | Freq: Every day | INTRAMUSCULAR | Status: DC
  Administered 2015-03-19 – 2015-03-20 (×2): 40 mg via INTRAVENOUS
  Filled 2015-03-19 (×2): qty 4

## 2015-03-19 MED ORDER — LEVOFLOXACIN 500 MG PO TABS
500.0000 mg | ORAL_TABLET | Freq: Every evening | ORAL | Status: DC
  Administered 2015-03-19 – 2015-03-20 (×2): 500 mg via ORAL
  Filled 2015-03-19 (×2): qty 1

## 2015-03-19 MED ORDER — AMLODIPINE BESYLATE 10 MG PO TABS
10.0000 mg | ORAL_TABLET | Freq: Every day | ORAL | Status: DC
  Administered 2015-03-20 – 2015-03-21 (×2): 10 mg via ORAL
  Filled 2015-03-19 (×2): qty 1

## 2015-03-19 MED ORDER — ENOXAPARIN SODIUM 40 MG/0.4ML ~~LOC~~ SOLN
40.0000 mg | SUBCUTANEOUS | Status: DC
  Administered 2015-03-19 – 2015-03-20 (×2): 40 mg via SUBCUTANEOUS
  Filled 2015-03-19 (×2): qty 0.4

## 2015-03-19 MED ORDER — ACETAMINOPHEN 650 MG RE SUPP: 650.0000 mg | Freq: Four times a day (QID) | RECTAL | Status: DC | PRN

## 2015-03-19 MED ORDER — SODIUM CHLORIDE 0.9 % IJ SOLN
3.0000 mL | Freq: Two times a day (BID) | INTRAMUSCULAR | Status: DC
  Administered 2015-03-19 – 2015-03-21 (×3): 3 mL via INTRAVENOUS

## 2015-03-19 MED ORDER — ENALAPRIL MALEATE 10 MG PO TABS
20.0000 mg | ORAL_TABLET | Freq: Two times a day (BID) | ORAL | Status: DC
  Administered 2015-03-20 – 2015-03-21 (×3): 20 mg via ORAL
  Filled 2015-03-19 (×3): qty 2

## 2015-03-19 MED ORDER — INSULIN PUMP
Freq: Three times a day (TID) | SUBCUTANEOUS | Status: DC
  Administered 2015-03-19 – 2015-03-20 (×2): via SUBCUTANEOUS
  Filled 2015-03-19: qty 1

## 2015-03-19 NOTE — ED Notes (Signed)
Pt to ED with c/o weakness, and feeling like "my balance is off", seen in ED on Friday for low NA, states he feels like today like he did on Friday, also states his last BS today was 302

## 2015-03-19 NOTE — ED Provider Notes (Signed)
[facility] Emergency Department Provider Note    ____________________________________________  Time seen: 1710  I have reviewed the triage vital signs and the nursing notes.   HISTORY  Chief Complaint Weakness   History limited by: Not Limited   HPI John Reilly is a 59 y.o. male who presents to the emergency department today because of concerns for an episode of weakness whilst at work. The patient states that he felt like he was having a hard time walking. Stated this occurred relatively suddenly. He did try to eat some food afterwards however that did not change his symptoms. He states that he had similar episode last week and was admitted to the hospital for low sodium. He states that he took his medication as prescribed. Denies any change in his diet. Denies any chest pain, shortness breath. Denies any nausea vomiting diarrhea. Denies any fevers.   Past Medical History  Diagnosis Date  . Diabetes mellitus without complication ([place])   . Hypertension   . Cancer ([place])   . GERD (gastroesophageal reflux disease)     Patient Active Problem List   Diagnosis Date Noted  . Hyponatremia 03/15/2015    Past Surgical History  Procedure Laterality Date  . Ankle arthroplasty      2013    Current Outpatient Rx  Name  Route  Sig  Dispense  Refill  . amLODipine (NORVASC) 10 MG tablet   Oral   Take 10 mg by mouth daily.         . cefUROXime (CEFTIN) 500 MG tablet   Oral   Take 1 tablet (500 mg total) by mouth 2 (two) times daily with a meal.   6 tablet   0   . enalapril (VASOTEC) 20 MG tablet   Oral   Take 20 mg by mouth 2 (two) times daily.          . folic acid (FOLVITE) 1 MG tablet   Oral   Take 1 mg by mouth daily.         . furosemide (LASIX) 20 MG tablet   Oral   Take 1 tablet (20 mg total) by mouth 2 (two) times daily.   6 tablet   0   . glucagon 1 MG injection   Other   1 mg by Other route once as needed (for severe  hypoglycemia).          Marland Kitchen HYDROcodone-acetaminophen (NORCO/VICODIN) 5-325 MG per tablet   Oral   Take 1 tablet by mouth every 6 (six) hours as needed. Patient not taking: Reported on 03/15/2015   20 tablet   0   . Insulin Human (INSULIN PUMP) SOLN      Pt uses Humalog.         . metoprolol succinate (TOPROL-XL) 50 MG 24 hr tablet   Oral   Take 50 mg by mouth daily.         . Multiple Vitamin (MULTIVITAMIN WITH MINERALS) TABS tablet   Oral   Take 1 tablet by mouth daily.         Marland Kitchen omeprazole (PRILOSEC) 40 MG capsule   Oral   Take 40 mg by mouth daily.         Marland Kitchen oxyCODONE-acetaminophen (ROXICET) 5-325 MG per tablet   Oral   Take 1 tablet by mouth every 8 (eight) hours as needed for moderate pain or severe pain (Do not drive or operate heavy machinery while taking as can cause drowsiness.). Patient not taking:  Reported on 03/15/2015   9 tablet   0   . silver sulfADIAZINE (SILVADENE) 1 % cream   Topical   Apply 1 application topically daily. Patient not taking: Reported on 03/15/2015   50 g   0     Allergies Penicillins  Family History  Problem Relation Age of Onset  . Stroke Mother   . Alzheimer's disease Father   . Diabetes Brother     Social History Social History  Substance Use Topics  . Smoking status: Former Research scientist (life sciences)  . Smokeless tobacco: Never Used  . Alcohol Use: 0.0 oz/week    0 Standard drinks or equivalent per week     Comment: 6 beers per day    Review of Systems  Constitutional: Negative for fever. Cardiovascular: Negative for chest pain. Respiratory: Negative for shortness of breath. Gastrointestinal: Negative for abdominal pain, vomiting and diarrhea. Genitourinary: Negative for dysuria. Musculoskeletal: Negative for back pain. Skin: Negative for rash. Neurological: Negative for headaches, focal weakness or numbness.  10-point ROS otherwise negative.  ____________________________________________   PHYSICAL EXAM:  VITAL  SIGNS: ED Triage Vitals  Enc Vitals Group     BP 03/19/15 1621 125/58 mmHg     Pulse Rate 03/19/15 1621 95     Resp 03/19/15 1621 18     Temp 03/19/15 1621 100.6 F (38.1 C)     Temp Source 03/19/15 1621 Oral     SpO2 03/19/15 1621 95 %     Weight 03/19/15 1621 196 lb (88.905 kg)     Height 03/19/15 1621 5\' 10"  (1.778 m)     Head Cir --      Peak Flow --      Pain Score 03/19/15 1622 0   Constitutional: Alert and oriented. Well appearing and in no distress. Eyes: Conjunctivae are normal. PERRL. Normal extraocular movements. ENT   Head: Normocephalic and atraumatic.   Nose: No congestion/rhinnorhea.   Mouth/Throat: Mucous membranes are moist.   Neck: No stridor. Hematological/Lymphatic/Immunilogical: No cervical lymphadenopathy. Cardiovascular: Normal rate, regular rhythm.  No murmurs, rubs, or gallops. Respiratory: Normal respiratory effort without tachypnea nor retractions. Breath sounds are clear and equal bilaterally. No wheezes/rales/rhonchi. Gastrointestinal: Soft and nontender. No distention.  Genitourinary: Deferred Musculoskeletal: Normal range of motion in all extremities. No joint effusions.  Patient has edema to his right leg. Neurologic:  Normal speech and language. No gross focal neurologic deficits are appreciated.  Skin:  Skin is warm, dry and intact. No rash noted. Psychiatric: Mood and affect are normal. Speech and behavior are normal. Patient exhibits appropriate insight and judgment.  ____________________________________________    LABS (pertinent positives/negatives)  Labs Reviewed  BASIC METABOLIC PANEL - Abnormal; Notable for the following:    Sodium 118 (*)    Chloride 85 (*)    Glucose, Bld 313 (*)    Calcium 7.7 (*)    All other components within normal limits  CBC - Abnormal; Notable for the following:    RBC 3.45 (*)    Hemoglobin 11.0 (*)    HCT 32.2 (*)    All other components within normal limits  GLUCOSE, CAPILLARY -  Abnormal; Notable for the following:    Glucose-Capillary 304 (*)    All other components within normal limits  URINALYSIS COMPLETEWITH MICROSCOPIC ([facility] ONLY)  CBG MONITORING, ED     ____________________________________________   EKG  I, Nance Pear, attending physician, personally viewed and interpreted this EKG  EKG Time: 1633 Rate: 91 Rhythm: NSR Axis: left axis deviation Intervals:  qtc 425 QRS: incomplete RBBB ST changes: st elevation in V2 (unchanged from previous EKG dated 11/4) Impression: abnormal EKG ____________________________________________    RADIOLOGY  None   ____________________________________________   PROCEDURES  Procedure(s) performed: None  Critical Care performed: No  ____________________________________________   INITIAL IMPRESSION / ASSESSMENT AND PLAN / ED COURSE  Pertinent labs & imaging results that were available during my care of the patient were reviewed by me and considered in my medical decision making (see chart for details).  She presented to the emergency department today with concerns for weakness. He did have a recent admission for similar complaints and was found to be hyponatremic. Patient was again hyponatremic today. Will plan on admission.  ____________________________________________   FINAL CLINICAL IMPRESSION(S) / ED DIAGNOSES  Final diagnoses:  Edema  Fever     Nance Pear, MD 03/20/15 1645

## 2015-03-19 NOTE — H&P (Signed)
[facility] [place] NAME: John Reilly    MR#:  [id number]  DATE OF BIRTH:  December 21, 1955  DATE OF ADMISSION:  03/19/2015  PRIMARY CARE PHYSICIAN: Glendon Axe, MD   REQUESTING/REFERRING PHYSICIAN: Dr. Archie Balboa  CHIEF COMPLAINT:  Weakness HISTORY OF PRESENT ILLNESS:  John Reilly  is a 59 y.o. male with a known history of diabetes with insulin pump, essential hypertension and GERD who was discharged from the hospital on Saturday for hyponatremia. At discharge his sodium level was 123 which is around his baseline. He presents today with weakness and found to have a fever as well. Patient says that he took his 6 doses of Lasix and antibiotics that he was prescribed. Patient denies dysuria or frequency. Patient denies cough.  PAST MEDICAL HISTORY:   Past Medical History  Diagnosis Date  . Diabetes mellitus without complication ([place])   . Hypertension   . Cancer ([place])   . GERD (gastroesophageal reflux disease)    Chronic hyponatremia baseline sodium 123-128. PAST SURGICAL HISTORY:   Past Surgical History  Procedure Laterality Date  . Ankle arthroplasty      2013    SOCIAL HISTORY:   Social History  Substance Use Topics  . Smoking status: Former Research scientist (life sciences)  . Smokeless tobacco: Never Used  . Alcohol Use: 0.0 oz/week    0 Standard drinks or equivalent per week     Comment: 6 beers per day    FAMILY HISTORY:   Family History  Problem Relation Age of Onset  . Stroke Mother   . Alzheimer's disease Father   . Diabetes Brother     DRUG ALLERGIES:   Allergies  Allergen Reactions  . Penicillins Other (See Comments)    Reaction:  Unknown; childhood reaction      REVIEW OF SYSTEMS:  CONSTITUTIONAL: ++ever, fatigue andakness.  EYES: No blurred or double vision.  EARS, NOSE, AND THROAT: No tinnitus or ear pain.  RESPIRATORY: No cough, shortness of breath, wheezing or hemoptysis.  CARDIOVASCULAR: No chest pain,  orthopnea,+++edema.  GASTROINTESTINAL: No nausea, vomiting, diarrhea or abdominal pain.  GENITOURINARY: No dysuria, hematuria.  ENDOCRINE: No polyuria, nocturia,  HEMATOLOGY: No anemia, easy bruising or bleeding SKIN: No rash or lesion. MUSCULOSKELETAL: No joint pain or arthritis.   NEUROLOGIC: No tingling, numbness, weakness.  PSYCHIATRY: No anxiety or depression.   MEDICATIONS AT HOME:   Prior to Admission medications   Medication Sig Start Date End Date Taking? Authorizing Provider  amLODipine (NORVASC) 10 MG tablet Take 10 mg by mouth daily.    Historical Provider, MD  enalapril (VASOTEC) 20 MG tablet Take 20 mg by mouth 2 (two) times daily.     Historical Provider, MD  folic acid (FOLVITE) 1 MG tablet Take 1 mg by mouth daily.    Historical Provider, MD  glucagon 1 MG injection 1 mg by Other route once as needed (for severe hypoglycemia).     Historical Provider, MD  Insulin Human (INSULIN PUMP) SOLN Pt uses Humalog.    Historical Provider, MD  metoprolol succinate (TOPROL-XL) 50 MG 24 hr tablet Take 50 mg by mouth daily.    Historical Provider, MD  Multiple Vitamin (MULTIVITAMIN WITH MINERALS) TABS tablet Take 1 tablet by mouth daily.    Historical Provider, MD  omeprazole (PRILOSEC) 40 MG capsule Take 40 mg by mouth daily.    Historical Provider, MD  oxyCODONE-acetaminophen (ROXICET) 5-325 MG per tablet Take 1 tablet by mouth every 8 (eight) hours as needed  for moderate pain or severe pain (Do not drive or operate heavy machinery while taking as can cause drowsiness.). Patient not taking: Reported on 03/15/2015 02/01/15   Marylene Land, NP  silver sulfADIAZINE (SILVADENE) 1 % cream Apply 1 application topically daily. Patient not taking: Reported on 03/15/2015 11/27/14   Trula Slade, DPM      VITAL SIGNS:  Blood pressure 123/60, pulse 88, temperature 100.6 F (38.1 C), temperature source Oral, resp. rate 24, height 5\' 10"  (1.778 m), weight 88.905 kg (196 lb), SpO2 94  %.  PHYSICAL EXAMINATION:  GENERAL:  59 y.o.-year-old patient lying in the bed with no acute distress.  EYES: Pupils equal, round, reactive to light and accommodation. No scleral icterus. Extraocular muscles intact.  HEENT: Head atraumatic, normocephalic. Oropharynx and nasopharynx clear.  NECK:  Supple, no jugular venous distention. No thyroid enlargement, no tenderness.  LUNGS: Normal breath sounds bilaterally, no wheezing, rales,rhonchi or crepitation. No use of accessory muscles of respiration.  CARDIOVASCULAR: S1, S2 normal. No murmurs, rubs, or gallops.  ABDOMEN: Soft, nontender, nondistended. Bowel sounds present. No organomegaly or mass.  EXTREMITIES: ++2+ LEE nonosis, or clubbing.  NEUROLOGIC: Cranial nerves II through XII are grossly intact. No focal deficits. PSYCHIATRIC: The patient is alert and oriented x 3.  SKIN: No obvious rash, lesion, or ulcer.   LABORATORY PANEL:   CBC  Recent Labs Lab 03/19/15 1627  WBC 7.0  HGB 11.0*  HCT 32.2*  PLT 257   ------------------------------------------------------------------------------------------------------------------  Chemistries   Recent Labs Lab 03/15/15 1845  03/19/15 1627  NA 118*  < > 118*  K 4.5  < > 4.6  CL 84*  < > 85*  CO2 23  < > 25  GLUCOSE 267*  < > 313*  BUN 15  < > 12  CREATININE 1.04  < > 0.79  CALCIUM 8.4*  < > 7.7*  AST 33  --   --   ALT 19  --   --   ALKPHOS 79  --   --   BILITOT 0.7  --   --   < > = values in this interval not displayed. ------------------------------------------------------------------------------------------------------------------  Cardiac Enzymes  Recent Labs Lab 03/14/15 2030  TROPONINI 0.03   ------------------------------------------------------------------------------------------------------------------  RADIOLOGY:  No results found.  EKG:   Normal sinus rhythm heart rate of 91 no ST elevation or depression. Incomplete right bundle branch block    IMPRESSION AND PLAN:    59 year old male with known history of type 2 diabetes on insulin pump who presents with weakness and was recently discharged with hyponatremia at his baseline sodium of 123-128.    1. Severe hyponatremia: It appears patient is back with similar issue from his last discharge with symptomatic hyponatremia.during last hospitalization he was given Lasix due to fluid overload and this did appear to increase sodium level. On examination today continues to have bilateral lower extremity edema. I will try Lasix again to see if this helps with sodium level. Next sodium level is due at 8 PM. I will also consult nephrology for further assistance in management.   I will also discontinue HCTZ   2. Lower extremity edema: It is unclear to me why the patient has lower extremity edema. I will order 2-D echocardiogram to evaluate cardiac function as well as TSH. I will also order lotion a Dopplers to evaluate for DVT.    3. Uncontrolled diabetes: Patient's blood sugars are usually controlled as per him and his granddaughter. I suspect patient may have  an underlying infection driving his blood sugars. He is currently with insulin pump which I will continue. Continue wet sugar monitoring every 6 hours.  4. Fever: I will check chest x-ray to evaluate for pneumonia as well as urine analysis. He was on Ceftin for acute bronchitis. Due to his fevers today I will change Ceftin to Levaquin for possible pneumonia.  5. Essential hypertension: Continue Norvasc, Vasotec, metoprolol and hydralazine. I will hold HCTZ due to hyponatremia.    All the records are reviewed and case discussed with ED provider. Management plans discussed with the patient and he is in agreement.  CODE STATUS: FULL  CRITICAL CARE TOTAL TIME TAKING CARE OF THIS PATIENT: 50 minutes.   Patient at high risk for cardiopulmonary arrest due to hyponatremia.   Laurance Heide M.D on 03/19/2015 at 5:55 PM  Between 7am to 6pm -  Pager - [phone] After 6pm go to [email or web address] - password [email or web address] [place] [facility]  Office  [phone]  CC: Primary care physician; Glendon Axe, MD

## 2015-03-19 NOTE — Consult Note (Signed)
ANTIBIOTIC CONSULT NOTE - INITIAL  Pharmacy Consult for levofloxacin  Indication: pneumonia  Allergies  Allergen Reactions  . Penicillins Other (See Comments)    Reaction:  Unknown; childhood reaction     Patient Measurements: Height: 5\' 10"  (177.8 cm) Weight: 196 lb (88.905 kg) IBW/kg (Calculated) : 73  Vital Signs: Temp: 98.7 F (37.1 C) (11/08 1938) Temp Source: Oral (11/08 1938) BP: 128/56 mmHg (11/08 1938) Pulse Rate: 80 (11/08 1938) Intake/Output from previous day:   Intake/Output from this shift:    Labs:  Recent Labs  03/19/15 1627  WBC 7.0  HGB 11.0*  PLT 257  CREATININE 0.79   Estimated Creatinine Clearance: 110.3 mL/min (by C-G formula based on Cr of 0.79). No results for input(s): VANCOTROUGH, VANCOPEAK, VANCORANDOM, GENTTROUGH, GENTPEAK, GENTRANDOM, TOBRATROUGH, TOBRAPEAK, TOBRARND, AMIKACINPEAK, AMIKACINTROU, AMIKACIN in the last 72 hours.   Microbiology: Recent Results (from the past 720 hour(s))  Blood culture (routine x 2)     Status: None (Preliminary result)   Collection Time: 03/15/15  9:45 PM  Result Value Ref Range Status   Specimen Description BLOOD RIGHT ASSIST CONTROL  Final   Special Requests BOTTLES DRAWN AEROBIC AND ANAEROBIC 5CC  Final   Culture NO GROWTH 4 DAYS  Final   Report Status PENDING  Incomplete  Blood culture (routine x 2)     Status: None (Preliminary result)   Collection Time: 03/15/15 10:15 PM  Result Value Ref Range Status   Specimen Description BLOOD LEFT ASSIST CONTROL  Final   Special Requests BOTTLES DRAWN AEROBIC AND ANAEROBIC 5CC  Final   Culture NO GROWTH 4 DAYS  Final   Report Status PENDING  Incomplete    Medical History: Past Medical History  Diagnosis Date  . Diabetes mellitus without complication ([place])   . Hypertension   . Cancer ([place])   . GERD (gastroesophageal reflux disease)     Medications:  Prescriptions prior to admission  Medication Sig Dispense Refill Last Dose  . amLODipine (NORVASC)  10 MG tablet Take 10 mg by mouth daily.   03/19/2015 at Unknown time  . enalapril (VASOTEC) 20 MG tablet Take 20 mg by mouth 2 (two) times daily.    03/19/2015 at Unknown time  . folic acid (FOLVITE) 1 MG tablet Take 1 mg by mouth daily.   03/19/2015 at Unknown time  . glucagon 1 MG injection 1 mg by Other route once as needed (for severe hypoglycemia).    PRN at PRN  . hydrALAZINE (APRESOLINE) 25 MG tablet Take 25 mg by mouth 2 (two) times daily.   03/19/2015 at Unknown time  . hydrochlorothiazide (HYDRODIURIL) 25 MG tablet Take 25 mg by mouth daily.   03/19/2015 at Unknown time  . Insulin Human (INSULIN PUMP) SOLN Pt uses Humalog.   03/19/2015 at Unknown time  . metoprolol succinate (TOPROL-XL) 50 MG 24 hr tablet Take 50 mg by mouth daily.   03/19/2015 at 0600  . Multiple Vitamin (MULTIVITAMIN WITH MINERALS) TABS tablet Take 1 tablet by mouth daily.   03/19/2015 at Unknown time  . omeprazole (PRILOSEC) 40 MG capsule Take 40 mg by mouth daily.   03/19/2015 at Unknown time   Scheduled:  . amLODipine  10 mg Oral Daily  . enalapril  20 mg Oral BID  . enoxaparin (LOVENOX) injection  40 mg Subcutaneous Q24H  . folic acid  1 mg Oral Daily  . furosemide  40 mg Intravenous Daily  . hydrALAZINE  25 mg Oral BID  . insulin pump  Subcutaneous TID AC, HS, 0200  . levofloxacin  500 mg Oral QPM  . metoprolol succinate  50 mg Oral Daily  . multivitamin with minerals  1 tablet Oral Daily  . pantoprazole  40 mg Oral Daily  . sodium chloride  3 mL Intravenous Q12H   Infusions:   PRN: acetaminophen **OR** acetaminophen, alum & mag hydroxide-simeth, ondansetron **OR** ondansetron (ZOFRAN) IV, oxyCODONE-acetaminophen, senna-docusate Anti-infectives    Start     Dose/Rate Route Frequency Ordered Stop   03/19/15 2000  levofloxacin (LEVAQUIN) tablet 500 mg     500 mg Oral Every evening 03/19/15 1951       Assessment: 59 yo male ordered levofloxacin for possible pneumonia.  Goal of Therapy:  Resolution of  infection  Plan:  Ordered levofloxacin 500mg  PO Q24H.   [place] Clinical Pharmacist 03/19/2015,7:52 PM

## 2015-03-19 NOTE — ED Notes (Signed)
CRITICAL VALUE ALERT  Critical value received:  sodium 118  Date of notification:  03/19/2015  Time of notification:  1722  Critical value read back:Yes.    Nurse who received alert:  Waymon Amato  MD notified (1st page):  MD Archie Balboa  Time of first page:  1722  MD notified (2nd page):  Time of second page:  Responding MD:  MD Archie Balboa  Time MD responded:  [phone]

## 2015-03-20 ENCOUNTER — Inpatient Hospital Stay: Admit: 2015-03-20 | Payer: BLUE CROSS/BLUE SHIELD

## 2015-03-20 ENCOUNTER — Inpatient Hospital Stay
Admit: 2015-03-20 | Discharge: 2015-03-20 | Disposition: A | Discharge status: Home / Self Care | Payer: BLUE CROSS/BLUE SHIELD | Attending: Internal Medicine | Admitting: Internal Medicine

## 2015-03-20 LAB — CBC
HEMATOCRIT: 30.9 % — AB (ref 40.0–52.0)
HEMOGLOBIN: 10.5 g/dL — AB (ref 13.0–18.0)
MCH: 31.8 pg (ref 26.0–34.0)
MCHC: 33.9 g/dL (ref 32.0–36.0)
MCV: 93.7 fL (ref 80.0–100.0)
Platelets: 244 10*3/uL (ref 150–440)
RBC: 3.3 MIL/uL — ABNORMAL LOW (ref 4.40–5.90)
RDW: 13.4 % (ref 11.5–14.5)
WBC: 6.4 10*3/uL (ref 3.8–10.6)

## 2015-03-20 LAB — BASIC METABOLIC PANEL
ANION GAP: 5 (ref 5–15)
BUN: 11 mg/dL (ref 6–20)
CHLORIDE: 90 mmol/L — AB (ref 101–111)
CO2: 31 mmol/L (ref 22–32)
Calcium: 7.8 mg/dL — ABNORMAL LOW (ref 8.9–10.3)
Creatinine, Ser: 0.7 mg/dL (ref 0.61–1.24)
GFR calc Af Amer: >60 mL/min (ref >60)
GLUCOSE: 211 mg/dL — AB (ref 65–99)
POTASSIUM: 3.9 mmol/L (ref 3.5–5.1)
SODIUM: 126 mmol/L — AB (ref 135–145)

## 2015-03-20 MED ORDER — INSULIN PUMP
Freq: Three times a day (TID) | SUBCUTANEOUS | Status: DC
  Administered 2015-03-20: 5 via SUBCUTANEOUS
  Administered 2015-03-20: 2.4 via SUBCUTANEOUS
  Administered 2015-03-20: 22:00:00 via SUBCUTANEOUS
  Administered 2015-03-20: 2.4 via SUBCUTANEOUS
  Administered 2015-03-21: 5.1 via SUBCUTANEOUS
  Administered 2015-03-21: 6.1 via SUBCUTANEOUS
  Administered 2015-03-21: 02:00:00 via SUBCUTANEOUS
  Filled 2015-03-20: qty 1

## 2015-03-20 NOTE — Progress Notes (Signed)
[facility]  ROUNDING NOTE   Subjective:   Patient was originally admitted for hyponatremia from 11/4 to 11/5 and treated with furosemide. He was discharged with three doses of furosemide. Unfortunately, he ran out and started to feel weak again. Presented to [facility] with sodium of 118. He was then again treated with IV furosemide and his sodium is now 126.  Patient is feeling better. Echocardiogram pending  Objective:  Vital signs in last 24 hours:  Temp:  [98.1 F (36.7 C)-100.6 F (38.1 C)] 98.1 F (36.7 C) (11/09 0851) Pulse Rate:  [73-95] 77 (11/09 1404) Resp:  [14-30] 14 (11/09 0851) BP: (110-128)/(51-63) 128/59 mmHg (11/09 1404) SpO2:  [94 %-98 %] 95 % (11/09 0851) Weight:  [88.905 kg (196 lb)] 88.905 kg (196 lb) (11/08 1621)  Weight change:  Filed Weights   03/19/15 1621  Weight: 88.905 kg (196 lb)    Intake/Output: I/O last 3 completed shifts: In: 120 [P.O.:120] Out: 800 [Urine:800]   Intake/Output this shift:  Total I/O In: 240 [P.O.:240] Out: 1150 [Urine:1150]  Physical Exam: General: NAD  Head: Normocephalic, atraumatic. Moist oral mucosal membranes  Eyes: Anicteric, PERRL  Neck: Supple, trachea midline  Lungs:  Clear to auscultation  Heart: Regular rate and rhythm  Abdomen:  Soft, nontender,   Extremities:  no peripheral edema. Right first toe ulcer clean and in dressings.   Neurologic: Nonfocal, moving all four extremities  Skin: No lesions       Basic Metabolic Panel:  Recent Labs Lab 03/15/15 1845 03/16/15 0323 03/19/15 1627 03/19/15 2024 03/20/15 0601  NA 118* 123* 118* 121* 126*  K 4.5 4.4 4.6  --  3.9  CL 84* 90* 85*  --  90*  CO2 23 28 25   --  31  GLUCOSE 267* 206* 313*  --  211*  BUN 15 17 12   --  11  CREATININE 1.04 0.98 0.79 0.77 0.70  CALCIUM 8.4* 7.9* 7.7*  --  7.8*    Liver Function Tests:  Recent Labs Lab 03/15/15 1845  AST 33  ALT 19  ALKPHOS 79  BILITOT 0.7  PROT 7.4  ALBUMIN 3.4*    Recent  Labs Lab 03/15/15 1845  LIPASE 22   No results for input(s): AMMONIA in the last 168 hours.  CBC:  Recent Labs Lab 03/15/15 1845 03/19/15 1627 03/19/15 2024 03/20/15 0601  WBC 6.7 7.0 6.1 6.4  HGB 11.6* 11.0* 10.2* 10.5*  HCT 33.2* 32.2* 29.6* 30.9*  MCV 95.7 93.2 93.8 93.7  PLT 240 257 216 244    Cardiac Enzymes:  Recent Labs Lab 03/14/15 2030  TROPONINI 0.03    BNP: Invalid input(s): POCBNP  CBG:  Recent Labs Lab 03/15/15 1843 03/15/15 2235 03/19/15 1625  GLUCAP 265* 290* 304*    Microbiology: Results for orders placed or performed during the hospital encounter of 03/15/15  Blood culture (routine x 2)     Status: None (Preliminary result)   Collection Time: 03/15/15  9:45 PM  Result Value Ref Range Status   Specimen Description BLOOD RIGHT ASSIST CONTROL  Final   Special Requests BOTTLES DRAWN AEROBIC AND ANAEROBIC 5CC  Final   Culture NO GROWTH 4 DAYS  Final   Report Status PENDING  Incomplete  Blood culture (routine x 2)     Status: None (Preliminary result)   Collection Time: 03/15/15 10:15 PM  Result Value Ref Range Status   Specimen Description BLOOD LEFT ASSIST CONTROL  Final   Special Requests BOTTLES DRAWN AEROBIC  AND ANAEROBIC 5CC  Final   Culture NO GROWTH 4 DAYS  Final   Report Status PENDING  Incomplete    Coagulation Studies: No results for input(s): LABPROT, INR in the last 72 hours.  Urinalysis:  Recent Labs  03/19/15 1627  COLORURINE YELLOW*  LABSPEC 1.013  PHURINE 7.0  GLUCOSEU >500*  HGBUR NEGATIVE  BILIRUBINUR NEGATIVE  KETONESUR 1+*  PROTEINUR 100*  NITRITE NEGATIVE  LEUKOCYTESUR NEGATIVE      Imaging: Dg Chest 1 View  03/19/2015  CLINICAL DATA:  Fever and weakness. EXAM: CHEST 1 VIEW COMPARISON:  None. FINDINGS: The heart is within normal limits in size. The mediastinal and hilar contours are within normal limits. There is peribronchial thickening and increased interstitial markings which could be acute or  chronic bronchitic change. Streaky areas of probable atelectasis but no definite infiltrates or effusions. Remote rib trauma noted on the left. IMPRESSION: Acute versus chronic bronchitic process. No definite infiltrates or effusions. Electronically Signed   By: Marijo Sanes M.D.   On: 03/19/2015 21:47   US Venous Img Lower Bilateral  03/19/2015  CLINICAL DATA:  Lower extremity edema for 6-7 months. EXAM: BILATERAL LOWER EXTREMITY VENOUS DOPPLER ULTRASOUND TECHNIQUE: Gray-scale sonography with graded compression, as well as color Doppler and duplex ultrasound were performed to evaluate the lower extremity deep venous systems from the level of the common femoral vein and including the common femoral, femoral, profunda femoral, popliteal and calf veins including the posterior tibial, peroneal and gastrocnemius veins when visible. The superficial great saphenous vein was also interrogated. Spectral Doppler was utilized to evaluate flow at rest and with distal augmentation maneuvers in the common femoral, femoral and popliteal veins. COMPARISON:  None. FINDINGS: RIGHT LOWER EXTREMITY Common Femoral Vein: No evidence of thrombus. Normal compressibility, respiratory phasicity and response to augmentation. Saphenofemoral Junction: No evidence of thrombus. Normal compressibility and flow on color Doppler imaging. Profunda Femoral Vein: No evidence of thrombus. Normal compressibility and flow on color Doppler imaging. Femoral Vein: No evidence of thrombus. Normal compressibility, respiratory phasicity and response to augmentation. Popliteal Vein: No evidence of thrombus. Normal compressibility, respiratory phasicity and response to augmentation. Calf Veins: No evidence of thrombus in the posterior tibial vein. Normal compressibility and flow on color Doppler imaging. Peroneal vein not visualized. Superficial Great Saphenous Vein: No evidence of thrombus. Normal compressibility and flow on color Doppler imaging. Venous  Reflux:  None. Other Findings:  None. LEFT LOWER EXTREMITY Common Femoral Vein: No evidence of thrombus. Normal compressibility, respiratory phasicity and response to augmentation. Saphenofemoral Junction: No evidence of thrombus. Normal compressibility and flow on color Doppler imaging. Profunda Femoral Vein: No evidence of thrombus. Normal compressibility and flow on color Doppler imaging. Femoral Vein: No evidence of thrombus. Normal compressibility, respiratory phasicity and response to augmentation. Popliteal Vein: No evidence of thrombus. Normal compressibility, respiratory phasicity and response to augmentation. Calf Veins: No evidence of thrombus in the posterior tibial vein. Normal compressibility and flow on color Doppler imaging. Perennial vein not visualized. Superficial Great Saphenous Vein: No evidence of thrombus. Normal compressibility and flow on color Doppler imaging. Venous Reflux:  None. Other Findings: Subcutaneous edema is noted. Physiologic appearing lymph nodes in the right groin. IMPRESSION: No evidence of bilateral lower extremity deep venous thrombosis. Electronically Signed   By: Jeb Levering M.D.   On: 03/19/2015 19:34     Medications:     . amLODipine  10 mg Oral Daily  . enalapril  20 mg Oral BID  . enoxaparin (LOVENOX)  injection  40 mg Subcutaneous Q24H  . folic acid  1 mg Oral Daily  . furosemide  40 mg Intravenous Daily  . hydrALAZINE  25 mg Oral BID  . insulin pump   Subcutaneous TID AC, HS, 0200  . levofloxacin  500 mg Oral QPM  . metoprolol succinate  50 mg Oral Daily  . multivitamin with minerals  1 tablet Oral Daily  . pantoprazole  40 mg Oral Daily  . sodium chloride  3 mL Intravenous Q12H   acetaminophen **OR** acetaminophen, alum & mag hydroxide-simeth, ondansetron **OR** ondansetron (ZOFRAN) IV, oxyCODONE-acetaminophen, senna-docusate  Assessment/ Plan:  Mr. John Reilly is a 59 y.o.white  male with with type 1 diabetes mellitus, hypertension,  anemia, GERD who was admitted for Edema [R60.9]hyponatremia   1. Hyponatremia: hypervolemic hypo-osmolar hypernatremia. Most likely due to hepatic disease, cardiac or nephrotic syndrome.  Echocardiogram pending.  - Continue furosemide IV. Continue to follow serum sodium levels.   - Patient may also need fluid restriction  2. Proteinuria: history suggestive of diabetic nephropathy. Nonnephrotic range. Albumin at goal.  - Continue enalapril for renal protection.   3. Hypertension and edema: blood pressure well controlled.  Edema could also be due to combination of amlodipine and hydralazine.  - Continue current regimen of furosemide, amlodipine, hydralazine, enalapril, and metoprolol.   4. Diabetes Mellitus type I: with renal manifestations.  - Continue glucose control.     LOS: [place], [place] 11/9/20163:34 PM

## 2015-03-20 NOTE — Progress Notes (Signed)
[place] at [facility]                                                                                                                                                                                            Patient Demographics   John Reilly, is a 59 y.o. male, DOB - [birth year], ZOX:[id number]  Admit date - 03/19/2015   Admitting Physician Bettey Costa, MD  Outpatient Primary MD for the patient is Singh,Jasmine, MD   LOS - 1  Subjective: Patient was just seen here recently and I discharged him few days ago with the same problem readmitted with hyponatremia again.     Review of Systems:   CONSTITUTIONAL: No documented fever. No fatigue, weakness. No weight gain, no weight loss.  EYES: No blurry or double vision.  ENT: No tinnitus. No postnasal drip. No redness of the oropharynx.  RESPIRATORY: No cough, no wheeze, no hemoptysis. No dyspnea.  CARDIOVASCULAR: No chest pain. No orthopnea. No palpitations. No syncope.  GASTROINTESTINAL: No nausea, no vomiting or diarrhea. No abdominal pain. No melena or hematochezia.  GENITOURINARY: No dysuria or hematuria.  ENDOCRINE: No polyuria or nocturia. No heat or cold intolerance.  HEMATOLOGY: No anemia. No bruising. No bleeding.  INTEGUMENTARY: No rashes. No lesions.  MUSCULOSKELETAL: No arthritis. No swelling. No gout.  NEUROLOGIC: Unsteady gait PSYCHIATRIC: No anxiety. No insomnia. No ADD.    Vitals:   Filed Vitals:   03/20/15 0525 03/20/15 0851 03/20/15 0951 03/20/15 1404  BP: 123/56 110/51 118/55 128/59  Pulse: 73 89 80 77  Temp: 98.6 F (37 C) 98.1 F (36.7 C)    TempSrc: Oral Oral    Resp: 20 14    Height:      Weight:      SpO2: 96% 95%      Wt Readings from Last 3 Encounters:  03/19/15 88.905 kg (196 lb)  03/15/15 89.676 kg (197 lb 11.2 oz)  02/11/15 88.451 kg (195 lb)     Intake/Output Summary (Last 24 hours) at 03/20/15 1528 Last data filed at 03/20/15 1516  Gross per 24  hour  Intake    360 ml  Output   1950 ml  Net  -1590 ml    Physical Exam:   GENERAL: Pleasant-appearing in no apparent distress.  HEAD, EYES, EARS, NOSE AND THROAT: Atraumatic, normocephalic. Extraocular muscles are intact. Pupils equal and reactive to light. Sclerae anicteric. No conjunctival injection. No oro-pharyngeal erythema.  NECK: Supple. There is no jugular venous distention. No bruits, no lymphadenopathy, no thyromegaly.  HEART: Regular rate and rhythm,. No murmurs, no rubs, no clicks.  LUNGS:  Clear to auscultation bilaterally. No rales or rhonchi. No wheezes.  ABDOMEN: Soft, flat, nontender, nondistended. Has good bowel sounds. No hepatosplenomegaly appreciated.  EXTREMITIES: No evidence of any cyanosis, clubbing, or peripheral edema.  +2 pedal and radial pulses bilaterally.  NEUROLOGIC: The patient is alert, awake, and oriented x3 with no focal motor or sensory deficits appreciated bilaterally.  SKIN: Moist and warm with no rashes appreciated.  Psych: Not anxious, depressed LN: No inguinal LN enlargement    Antibiotics   Anti-infectives    Start     Dose/Rate Route Frequency Ordered Stop   03/19/15 2000  levofloxacin (LEVAQUIN) tablet 500 mg     500 mg Oral Every evening 03/19/15 1951        Medications   Scheduled Meds: . amLODipine  10 mg Oral Daily  . enalapril  20 mg Oral BID  . enoxaparin (LOVENOX) injection  40 mg Subcutaneous Q24H  . folic acid  1 mg Oral Daily  . furosemide  40 mg Intravenous Daily  . hydrALAZINE  25 mg Oral BID  . insulin pump   Subcutaneous TID AC, HS, 0200  . levofloxacin  500 mg Oral QPM  . metoprolol succinate  50 mg Oral Daily  . multivitamin with minerals  1 tablet Oral Daily  . pantoprazole  40 mg Oral Daily  . sodium chloride  3 mL Intravenous Q12H   Continuous Infusions:  PRN Meds:.acetaminophen **OR** acetaminophen, alum & mag hydroxide-simeth, ondansetron **OR** ondansetron (ZOFRAN) IV, oxyCODONE-acetaminophen,  senna-docusate   Data Review:   Micro Results Recent Results (from the past 240 hour(s))  Blood culture (routine x 2)     Status: None (Preliminary result)   Collection Time: 03/15/15  9:45 PM  Result Value Ref Range Status   Specimen Description BLOOD RIGHT ASSIST CONTROL  Final   Special Requests BOTTLES DRAWN AEROBIC AND ANAEROBIC 5CC  Final   Culture NO GROWTH 4 DAYS  Final   Report Status PENDING  Incomplete  Blood culture (routine x 2)     Status: None (Preliminary result)   Collection Time: 03/15/15 10:15 PM  Result Value Ref Range Status   Specimen Description BLOOD LEFT ASSIST CONTROL  Final   Special Requests BOTTLES DRAWN AEROBIC AND ANAEROBIC 5CC  Final   Culture NO GROWTH 4 DAYS  Final   Report Status PENDING  Incomplete    Radiology Reports Dg Chest 1 View  03/19/2015  CLINICAL DATA:  Fever and weakness. EXAM: CHEST 1 VIEW COMPARISON:  None. FINDINGS: The heart is within normal limits in size. The mediastinal and hilar contours are within normal limits. There is peribronchial thickening and increased interstitial markings which could be acute or chronic bronchitic change. Streaky areas of probable atelectasis but no definite infiltrates or effusions. Remote rib trauma noted on the left. IMPRESSION: Acute versus chronic bronchitic process. No definite infiltrates or effusions. Electronically Signed   By: Marijo Sanes M.D.   On: 03/19/2015 21:47   US Venous Img Lower Bilateral  03/19/2015  CLINICAL DATA:  Lower extremity edema for 6-7 months. EXAM: BILATERAL LOWER EXTREMITY VENOUS DOPPLER ULTRASOUND TECHNIQUE: Gray-scale sonography with graded compression, as well as color Doppler and duplex ultrasound were performed to evaluate the lower extremity deep venous systems from the level of the common femoral vein and including the common femoral, femoral, profunda femoral, popliteal and calf veins including the posterior tibial, peroneal and gastrocnemius veins when visible. The  superficial great saphenous vein was also interrogated. Spectral Doppler was utilized to  evaluate flow at rest and with distal augmentation maneuvers in the common femoral, femoral and popliteal veins. COMPARISON:  None. FINDINGS: RIGHT LOWER EXTREMITY Common Femoral Vein: No evidence of thrombus. Normal compressibility, respiratory phasicity and response to augmentation. Saphenofemoral Junction: No evidence of thrombus. Normal compressibility and flow on color Doppler imaging. Profunda Femoral Vein: No evidence of thrombus. Normal compressibility and flow on color Doppler imaging. Femoral Vein: No evidence of thrombus. Normal compressibility, respiratory phasicity and response to augmentation. Popliteal Vein: No evidence of thrombus. Normal compressibility, respiratory phasicity and response to augmentation. Calf Veins: No evidence of thrombus in the posterior tibial vein. Normal compressibility and flow on color Doppler imaging. Peroneal vein not visualized. Superficial Great Saphenous Vein: No evidence of thrombus. Normal compressibility and flow on color Doppler imaging. Venous Reflux:  None. Other Findings:  None. LEFT LOWER EXTREMITY Common Femoral Vein: No evidence of thrombus. Normal compressibility, respiratory phasicity and response to augmentation. Saphenofemoral Junction: No evidence of thrombus. Normal compressibility and flow on color Doppler imaging. Profunda Femoral Vein: No evidence of thrombus. Normal compressibility and flow on color Doppler imaging. Femoral Vein: No evidence of thrombus. Normal compressibility, respiratory phasicity and response to augmentation. Popliteal Vein: No evidence of thrombus. Normal compressibility, respiratory phasicity and response to augmentation. Calf Veins: No evidence of thrombus in the posterior tibial vein. Normal compressibility and flow on color Doppler imaging. Perennial vein not visualized. Superficial Great Saphenous Vein: No evidence of thrombus. Normal  compressibility and flow on color Doppler imaging. Venous Reflux:  None. Other Findings: Subcutaneous edema is noted. Physiologic appearing lymph nodes in the right groin. IMPRESSION: No evidence of bilateral lower extremity deep venous thrombosis. Electronically Signed   By: Jeb Levering M.D.   On: 03/19/2015 19:34     CBC  Recent Labs Lab 03/15/15 1845 03/19/15 1627 03/19/15 2024 03/20/15 0601  WBC 6.7 7.0 6.1 6.4  HGB 11.6* 11.0* 10.2* 10.5*  HCT 33.2* 32.2* 29.6* 30.9*  PLT 240 257 216 244  MCV 95.7 93.2 93.8 93.7  MCH 33.3 31.8 32.4 31.8  MCHC 34.8 34.1 34.5 33.9  RDW 13.6 13.6 13.5 13.4    Chemistries   Recent Labs Lab 03/15/15 1845 03/16/15 0323 03/19/15 1627 03/19/15 2024 03/20/15 0601  NA 118* 123* 118* 121* 126*  K 4.5 4.4 4.6  --  3.9  CL 84* 90* 85*  --  90*  CO2 23 28 25   --  31  GLUCOSE 267* 206* 313*  --  211*  BUN 15 17 12   --  11  CREATININE 1.04 0.98 0.79 0.77 0.70  CALCIUM 8.4* 7.9* 7.7*  --  7.8*  AST 33  --   --   --   --   ALT 19  --   --   --   --   ALKPHOS 79  --   --   --   --   BILITOT 0.7  --   --   --   --    ------------------------------------------------------------------------------------------------------------------ estimated creatinine clearance is 110.3 mL/min (by C-G formula based on Cr of 0.7). ------------------------------------------------------------------------------------------------------------------ No results for input(s): HGBA1C in the last 72 hours. ------------------------------------------------------------------------------------------------------------------ No results for input(s): CHOL, HDL, LDLCALC, TRIG, CHOLHDL, LDLDIRECT in the last 72 hours. ------------------------------------------------------------------------------------------------------------------  Recent Labs  03/19/15 1627  TSH 1.417    ------------------------------------------------------------------------------------------------------------------ No results for input(s): VITAMINB12, FOLATE, FERRITIN, TIBC, IRON, RETICCTPCT in the last 72 hours.  Coagulation profile No results for input(s): INR, PROTIME in the last 168 hours.  No results for input(s): DDIMER in the last 72 hours.  Cardiac Enzymes  Recent Labs Lab 03/14/15 2030  TROPONINI 0.03   ------------------------------------------------------------------------------------------------------------------ Invalid input(s): POCBNP    Assessment & Plan   59 year old male with known history of type 2 diabetes on insulin pump who presents with weakness and was recently discharged with hyponatremia at his baseline sodium of 123-128.   1. Severe hyponatremia recurrent issue, suspect due to volume overload nephrology evaluation pending   2. Lower extremity edema: Lower extremity Dopplers negative   3. Uncontrolled diabetes: Continue insulin pump  4. Fever: On Levaquin  5. Essential hypertension: Continue Norvasc, Vasotec, metoprolol and hydralazine.  Patient was recommended to stop ACTZ during his previous admission he should not be on this medication. But we continued that I stopped on admission      Code Status Orders        Start     Ordered   03/19/15 1940  Full code   Continuous     03/19/15 1939           Consults nephrology   DVT Prophylaxis  heparin  Lab Results  Component Value Date   PLT 244 03/20/2015     Time Spent in minutes   35 minutes Dustin Flock M.D on 03/20/2015 at 3:28 PM  Between 7am to 6pm - Pager - [phone]  After 6pm go to [email or web address] - password [email or web address] [facility]   Office  [phone]

## 2015-03-20 NOTE — Progress Notes (Signed)
*  PRELIMINARY RESULTS* Echocardiogram 2D Echocardiogram has been performed.  John Reilly 03/20/2015, 12:51 PM

## 2015-03-20 NOTE — Progress Notes (Signed)
Spoke with patient regarding diabetes and home regimen for diabetes management.  Patient states that he was diagnosed with diabetes in 1988 and he is followed by Dr. Eddie Dibbles for diabetes management. Patient last saw Dr. Eddie Dibbles on 02/19/15. Patient uses a [employer] insulin pump with Humalog insulin as an outpatient. Patient has insulin pump connected and he states that he changed his infusion site out last night prior to coming to the hospital. Patient does not have any extra insulin pump supplies with him here at the hospital. Asked that patient have a family member bring extra insulin pump supplies here to the hospital so he will have them in case he needs them. Patient reports that his glucose has been elevated since his last hospital admission a few days ago.  Current insulin pump settings are as follows:  Basal insulin  12A 0.5 units/hour 3A 0.5 units/hour 8A 0.45 units/hour 4P 0.65 units/hour 7P 0.625 units/hour Total daily basal insulin: 12.675 units/24 hours  Carb Coverage 1:18 1 unit for every 18 grams of carbohydrates  Insulin Sensitivity 12A  1:60 1 unit drops blood glucose 60 mg/dl 2P    1:50         1 units drops blood glucose 50 mg/dl  Target Glucose Goals 100-130 mg/dl  In talking with the patient he states that his blood glucose normally runs very good and his last A1C was 7.0% (verified in care everywhere that last A1C was 7.% on 02/14/15) .  Patient rarely has a glucose of greater than 200 mg/dl. Inquired about slight difference in basal rates from current settings and Dr. Sammuel Hines office note from 02/19/15 which had: Basal Rates:  Time Dose (u/hr)  12a 0.525  8a 0.450  4p 0.650  7p 0.625   24hr Basal: 12.875u   Bolus Settings:  I:C 1:18  SF 50  TBG 100-130   Patient states that he was having a lot of hypoglycemia so he decreased some of his basal rates slightly. Patient reports that since he has made the change about 3 weeks ago, he has rarely had any low glucose  levels. Talked with patient about insulin pump policy and importance of allowing staff to check his glucose so that it is documented in the chart. Patient does not currently have the patient insulin pump flow sheet at his bedside. Will talk with RN and have nursing bring flowsheet for him to chart glucose, carbs, and insulin boluses on. Talked with patient about elevated glucose and let him know that if glucose does not come down, he may need to remove insulin pump and use SQ insulin for inpatient glycemic control. Informed patient that I would request MD change CBGs and Novolog correction to Q4H in order to improve inpatient glycemic control. Patient verbalized understanding of information discussed and states that he does not have any further questions related to diabetes at this time.  Talked with Ivin Poot, RN about insulin pump contract, patient flow sheet, and nursing flow sheet. NURSING will need to document in the chart from the information patient has on his insulin pump flow sheet and they will also need to complete the NURSING insulin pump flow sheet once a shift.   Thanks, Barnie Alderman, RN, MSN, CDE Diabetes Coordinator Inpatient Diabetes Program [phone] (Team Pager from [place] to [place]) [phone] ([facility] office) [phone] [facility] office) [phone] [facility] office)

## 2015-03-20 NOTE — Progress Notes (Signed)
Patient showed nurse  his insulin pump basal rate regimen.  At 0000 - 0.50 units   At 0300-  0.5 units  At 0800- 0.45 units   At 1600 - 0.65 units.

## 2015-03-21 ENCOUNTER — Ambulatory Visit: Payer: BLUE CROSS/BLUE SHIELD | Admitting: Podiatry

## 2015-03-21 LAB — BASIC METABOLIC PANEL
Anion gap: 9 (ref 5–15)
BUN: 11 mg/dL (ref 6–20)
CO2: 30 mmol/L (ref 22–32)
CREATININE: 0.78 mg/dL (ref 0.61–1.24)
Calcium: 7.8 mg/dL — ABNORMAL LOW (ref 8.9–10.3)
Chloride: 89 mmol/L — ABNORMAL LOW (ref 101–111)
GFR calc Af Amer: >60 mL/min (ref >60)
GLUCOSE: 369 mg/dL — AB (ref 65–99)
Potassium: 3.7 mmol/L (ref 3.5–5.1)
SODIUM: 128 mmol/L — AB (ref 135–145)

## 2015-03-21 LAB — HEPATIC FUNCTION PANEL
ALT: 38 U/L (ref 17–63)
AST: 34 U/L (ref 15–41)
Albumin: 2.7 g/dL — ABNORMAL LOW (ref 3.5–5.0)
Alkaline Phosphatase: 108 U/L (ref 38–126)
BILIRUBIN TOTAL: 0.6 mg/dL (ref 0.3–1.2)
Total Protein: 6 g/dL — ABNORMAL LOW (ref 6.5–8.1)

## 2015-03-21 LAB — GLUCOSE, CAPILLARY
GLUCOSE-CAPILLARY: 225 mg/dL — AB (ref 65–99)
GLUCOSE-CAPILLARY: 301 mg/dL — AB (ref 65–99)

## 2015-03-21 LAB — AMMONIA: AMMONIA: 14 umol/L (ref 9–35)

## 2015-03-21 MED ORDER — FUROSEMIDE 20 MG PO TABS: 20.0000 mg | ORAL_TABLET | Freq: Every day | ORAL | Status: DC

## 2015-03-21 MED ORDER — LEVOFLOXACIN 500 MG PO TABS: 500.0000 mg | ORAL_TABLET | Freq: Every evening | ORAL | Status: DC

## 2015-03-21 MED ORDER — FUROSEMIDE 20 MG PO TABS
20.0000 mg | ORAL_TABLET | Freq: Every day | ORAL | Status: DC
  Administered 2015-03-21: 20 mg via ORAL
  Filled 2015-03-21: qty 1

## 2015-03-21 NOTE — Discharge Instructions (Signed)
°  DIET:  Cardiac diet  DISCHARGE CONDITION:  Stable  ACTIVITY:  Activity as tolerated  OXYGEN:  Home Oxygen: No.   Oxygen Delivery: room air  DISCHARGE LOCATION:  home    ADDITIONAL DISCHARGE INSTRUCTION: go tio Dr. Orpha Bur office tomm for sodium level check   If you experience worsening of your admission symptoms, develop shortness of breath, life threatening emergency, suicidal or homicidal thoughts you must seek medical attention immediately by calling 911 or calling your MD immediately  if symptoms less severe.  You Must read complete instructions/literature along with all the possible adverse reactions/side effects for all the Medicines you take and that have been prescribed to you. Take any new Medicines after you have completely understood and accpet all the possible adverse reactions/side effects.   Please note  You were cared for by a hospitalist during your hospital stay. If you have any questions about your discharge medications or the care you received while you were in the hospital after you are discharged, you can call the unit and asked to speak with the hospitalist on call if the hospitalist that took care of you is not available. Once you are discharged, your primary care physician will handle any further medical issues. Please note that NO REFILLS for any discharge medications will be authorized once you are discharged, as it is imperative that you return to your primary care physician (or establish a relationship with a primary care physician if you do not have one) for your aftercare needs so that they can reassess your need for medications and monitor your lab values.

## 2015-03-21 NOTE — Progress Notes (Signed)
Patient checked own Blood Sugar at 0558 and results were 242. Patient is wearing insulin pump.

## 2015-03-21 NOTE — Progress Notes (Signed)
[facility]  ROUNDING NOTE   Subjective:   Na 128 On furosemide 40mg  IV.   No more edema Wife and brother at bedside.   Objective:  Vital signs in last 24 hours:  Temp:  [98.6 F (37 C)-98.8 F (37.1 C)] 98.6 F (37 C) (11/10 0600) Pulse Rate:  [77-102] 102 (11/10 0600) Resp:  [18] 18 (11/10 0600) BP: (128-133)/(59-66) 133/66 mmHg (11/10 0600) SpO2:  [93 %-94 %] 93 % (11/10 0600)  Weight change:  Filed Weights   03/19/15 1621  Weight: 88.905 kg (196 lb)    Intake/Output: I/O last 3 completed shifts: In: 700 [P.O.:700] Out: 3075 [Urine:3075]   Intake/Output this shift:  Total I/O In: 240 [P.O.:240] Out: -   Physical Exam: General: NAD  Head: Normocephalic, atraumatic. Moist oral mucosal membranes  Eyes: Anicteric, PERRL  Neck: Supple, trachea midline  Lungs:  Clear to auscultation  Heart: Regular rate and rhythm  Abdomen:  Soft, nontender,   Extremities:  no peripheral edema. Right first toe ulcer clean and scabbed over.   Neurologic: Nonfocal, moving all four extremities  Skin: No lesions       Basic Metabolic Panel:  Recent Labs Lab 03/15/15 1845 03/16/15 0323 03/19/15 1627 03/19/15 2024 03/20/15 0601 03/21/15 0902  NA 118* 123* 118* 121* 126* 128*  K 4.5 4.4 4.6  --  3.9 3.7  CL 84* 90* 85*  --  90* 89*  CO2 23 28 25   --  31 30  GLUCOSE 267* 206* 313*  --  211* 369*  BUN 15 17 12   --  11 11  CREATININE 1.04 0.98 0.79 0.77 0.70 0.78  CALCIUM 8.4* 7.9* 7.7*  --  7.8* 7.8*    Liver Function Tests:  Recent Labs Lab 03/15/15 1845  AST 33  ALT 19  ALKPHOS 79  BILITOT 0.7  PROT 7.4  ALBUMIN 3.4*    Recent Labs Lab 03/15/15 1845  LIPASE 22   No results for input(s): AMMONIA in the last 168 hours.  CBC:  Recent Labs Lab 03/15/15 1845 03/19/15 1627 03/19/15 2024 03/20/15 0601  WBC 6.7 7.0 6.1 6.4  HGB 11.6* 11.0* 10.2* 10.5*  HCT 33.2* 32.2* 29.6* 30.9*  MCV 95.7 93.2 93.8 93.7  PLT 240 257 216 244     Cardiac Enzymes:  Recent Labs Lab 03/14/15 2030  TROPONINI 0.03    BNP: Invalid input(s): POCBNP  CBG:  Recent Labs Lab 03/15/15 1843 03/15/15 2235 03/19/15 1625 03/21/15 0755  GLUCAP 265* 290* 304* 225*    Microbiology: Results for orders placed or performed during the hospital encounter of 03/15/15  Blood culture (routine x 2)     Status: None (Preliminary result)   Collection Time: 03/15/15  9:45 PM  Result Value Ref Range Status   Specimen Description BLOOD RIGHT ASSIST CONTROL  Final   Special Requests BOTTLES DRAWN AEROBIC AND ANAEROBIC 5CC  Final   Culture NO GROWTH 4 DAYS  Final   Report Status PENDING  Incomplete  Blood culture (routine x 2)     Status: None (Preliminary result)   Collection Time: 03/15/15 10:15 PM  Result Value Ref Range Status   Specimen Description BLOOD LEFT ASSIST CONTROL  Final   Special Requests BOTTLES DRAWN AEROBIC AND ANAEROBIC 5CC  Final   Culture NO GROWTH 4 DAYS  Final   Report Status PENDING  Incomplete    Coagulation Studies: No results for input(s): LABPROT, INR in the last 72 hours.  Urinalysis:  Recent Labs  03/19/15 1627  COLORURINE YELLOW*  LABSPEC 1.013  PHURINE 7.0  GLUCOSEU >500*  HGBUR NEGATIVE  BILIRUBINUR NEGATIVE  KETONESUR 1+*  PROTEINUR 100*  NITRITE NEGATIVE  LEUKOCYTESUR NEGATIVE      Imaging: Dg Chest 1 View  03/19/2015  CLINICAL DATA:  Fever and weakness. EXAM: CHEST 1 VIEW COMPARISON:  None. FINDINGS: The heart is within normal limits in size. The mediastinal and hilar contours are within normal limits. There is peribronchial thickening and increased interstitial markings which could be acute or chronic bronchitic change. Streaky areas of probable atelectasis but no definite infiltrates or effusions. Remote rib trauma noted on the left. IMPRESSION: Acute versus chronic bronchitic process. No definite infiltrates or effusions. Electronically Signed   By: Marijo Sanes M.D.   On: 03/19/2015  21:47   US Venous Img Lower Bilateral  03/19/2015  CLINICAL DATA:  Lower extremity edema for 6-7 months. EXAM: BILATERAL LOWER EXTREMITY VENOUS DOPPLER ULTRASOUND TECHNIQUE: Gray-scale sonography with graded compression, as well as color Doppler and duplex ultrasound were performed to evaluate the lower extremity deep venous systems from the level of the common femoral vein and including the common femoral, femoral, profunda femoral, popliteal and calf veins including the posterior tibial, peroneal and gastrocnemius veins when visible. The superficial great saphenous vein was also interrogated. Spectral Doppler was utilized to evaluate flow at rest and with distal augmentation maneuvers in the common femoral, femoral and popliteal veins. COMPARISON:  None. FINDINGS: RIGHT LOWER EXTREMITY Common Femoral Vein: No evidence of thrombus. Normal compressibility, respiratory phasicity and response to augmentation. Saphenofemoral Junction: No evidence of thrombus. Normal compressibility and flow on color Doppler imaging. Profunda Femoral Vein: No evidence of thrombus. Normal compressibility and flow on color Doppler imaging. Femoral Vein: No evidence of thrombus. Normal compressibility, respiratory phasicity and response to augmentation. Popliteal Vein: No evidence of thrombus. Normal compressibility, respiratory phasicity and response to augmentation. Calf Veins: No evidence of thrombus in the posterior tibial vein. Normal compressibility and flow on color Doppler imaging. Peroneal vein not visualized. Superficial Great Saphenous Vein: No evidence of thrombus. Normal compressibility and flow on color Doppler imaging. Venous Reflux:  None. Other Findings:  None. LEFT LOWER EXTREMITY Common Femoral Vein: No evidence of thrombus. Normal compressibility, respiratory phasicity and response to augmentation. Saphenofemoral Junction: No evidence of thrombus. Normal compressibility and flow on color Doppler imaging. Profunda  Femoral Vein: No evidence of thrombus. Normal compressibility and flow on color Doppler imaging. Femoral Vein: No evidence of thrombus. Normal compressibility, respiratory phasicity and response to augmentation. Popliteal Vein: No evidence of thrombus. Normal compressibility, respiratory phasicity and response to augmentation. Calf Veins: No evidence of thrombus in the posterior tibial vein. Normal compressibility and flow on color Doppler imaging. Perennial vein not visualized. Superficial Great Saphenous Vein: No evidence of thrombus. Normal compressibility and flow on color Doppler imaging. Venous Reflux:  None. Other Findings: Subcutaneous edema is noted. Physiologic appearing lymph nodes in the right groin. IMPRESSION: No evidence of bilateral lower extremity deep venous thrombosis. Electronically Signed   By: Jeb Levering M.D.   On: 03/19/2015 19:34     Medications:     . amLODipine  10 mg Oral Daily  . enalapril  20 mg Oral BID  . enoxaparin (LOVENOX) injection  40 mg Subcutaneous Q24H  . folic acid  1 mg Oral Daily  . furosemide  40 mg Intravenous Daily  . hydrALAZINE  25 mg Oral BID  . insulin pump   Subcutaneous TID AC, HS, 0200  .  levofloxacin  500 mg Oral QPM  . metoprolol succinate  50 mg Oral Daily  . multivitamin with minerals  1 tablet Oral Daily  . pantoprazole  40 mg Oral Daily  . sodium chloride  3 mL Intravenous Q12H   acetaminophen **OR** acetaminophen, alum & mag hydroxide-simeth, ondansetron **OR** ondansetron (ZOFRAN) IV, oxyCODONE-acetaminophen, senna-docusate  Assessment/ Plan:  Mr. John Reilly is a 59 y.o.white  male with with type 1 diabetes mellitus, hypertension, anemia, GERD who was admitted for Edema [R60.9]hyponatremia   1. Hyponatremia: hypervolemic hypo-osmolar hypernatremia. Most likely due to hepatic disease, cardiac or nephrotic syndrome.  Echocardiogram pending.  - furosemide IV. Change to PO furosemide.  - Continue to follow serum sodium  levels.   - Patient may also need fluid restriction  2. Proteinuria: history suggestive of diabetic nephropathy. Nonnephrotic range. Albumin at goal.  - Continue enalapril for renal protection.   3. Hypertension and edema: blood pressure well controlled.  Edema could also be due to combination of amlodipine and hydralazine.  - Continue current regimen of furosemide, amlodipine, hydralazine, enalapril, and metoprolol.   4. Diabetes Mellitus type I: with renal manifestations.  - Continue glucose control.     LOS: [place], [place] 11/10/201610:29 AM

## 2015-03-21 NOTE — Progress Notes (Signed)
Pt alert and oriented. Discharge information given. Prescriptions given. IV site removed.

## 2015-03-21 NOTE — Progress Notes (Signed)
Inpatient Diabetes Program Recommendations  AACE/ADA: New Consensus Statement on Inpatient Glycemic Control (2015)  Target Ranges:  Prepandial:   less than 140 mg/dL      Peak postprandial:   less than 180 mg/dL (1-2 hours)      Critically ill patients:  140 - 180 mg/dL   Review of Glycemic Control  Results for GERROLD, SWAPP (MRN [id number]) as of 03/21/2015 08:08  Ref. Range 03/15/2015 18:43 03/15/2015 22:35 03/19/2015 16:25 03/21/2015 07:55  Glucose-Capillary Latest Ref Range: 65-99 mg/dL 265 (H) 290 (H) 304 (H) 225 (H)   Current insulin pump settings are as follows:  Basal insulin  12A0.5 units/hour 3A0.5 units/hour 8A0.45 units/hour 4P0.65 units/hour 7P0.625 units/hour Total daily basal insulin: 12.675 units/24 hours  Carb Coverage 1:181 unit for every 18 grams of carbohydrates  Insulin Sensitivity 12A 1:601 unit drops blood glucose 60 mg/dl 2P 1:50 1 units drops blood glucose 50 mg/dl  Target Glucose Goals 100-130 mg/dl  I went to speak to the patient this morning since there is no blood sugars in the computer- he has been checking his blood sugars on his own meter.  I reviewed the policy he signed stating he would use our blood sugar readings. This morning he checked his blood sugar on his own meter at 6am and it was 242mg /dl- when I asked him if he had bolused for it- he said he didn't because he wasn't eating and he would go too low- I tried to explain that the pump knows what to deliver based on the blood sugar and that this correction bolus is necessary.  His routine is to check sugar and eat immediately so he does not understand the concept of the "correction bolus".  The tray arrived near 8am so the staff checked his sugar on our meter at that time and he will input this BG and calculate his carbs to deliver his insulin.  He has not changed his basal rates since yesterday.  Site- changed on Tuesday March 19, 2015- site dry and not red.  Uses Humalog insulin in his pump- has no supplies with him but is hoping to leave today. Total daily dose yesterday= 31.775 units, November 8th - TDD= 32.1 units.  Gentry Fitz, RN, BA, MHA, CDE Diabetes Coordinator Inpatient Diabetes Program  [phone] (Team Pager) [phone] ([place]) 03/21/2015 8:17 AM

## 2015-03-21 NOTE — Discharge Summary (Signed)
John Reilly, 59 y.o., DOB 07/07/1955, MRN [id number]. Admission date: 03/19/2015 Discharge Date 03/21/2015 Primary MD Glendon Axe, MD Admitting Physician Bettey Costa, MD  Admission Diagnosis  Edema [R60.9]  Discharge Diagnosis   Active Problems:   Hyponatremia  diabetes type 2 Fever Hypertension GERD Cancer      Hospital Course  John Reilly is a 59 y.o. male with a known history of type 2 diabetes on insulin pump presenting with nausea. Patient has chronic hyponatremia and a sodium around 123-125. Patient was recently discharged from the hospital he was at work and was unsteady brought back to the emergency room  and was noted to have a sodium of 121.  Patient states that he was taking the Lasix that was prescribed to him. On discharge. However it was unclear whether he actually got the prescription filled or not. During this hospitalization he had a echocardiogram of the heart results are currently pending. Patient's sodium levels elevated to 128. Nephrologist and I recommended patient to stay but he is very anxious to go home. He will have a follow-up sodium done tomorrow in Dr. Orpha Bur office. Patient has also had a fever on presentation he was recently discharged on Ceftin now his changeover Levaquin he has not had any further fevers in the hospital.        Consults  nephrology   Significant Tests:  See full reports for all details    Dg Chest 1 View  03/19/2015  CLINICAL DATA:  Fever and weakness. EXAM: CHEST 1 VIEW COMPARISON:  None. FINDINGS: The heart is within normal limits in size. The mediastinal and hilar contours are within normal limits. There is peribronchial thickening and increased interstitial markings which could be acute or chronic bronchitic change. Streaky areas of probable atelectasis but no definite infiltrates or effusions. Remote rib trauma noted on the left. IMPRESSION: Acute versus chronic bronchitic process. No definite infiltrates or effusions.  Electronically Signed   By: Marijo Sanes M.D.   On: 03/19/2015 21:47   US Venous Img Lower Bilateral  03/19/2015  CLINICAL DATA:  Lower extremity edema for 6-7 months. EXAM: BILATERAL LOWER EXTREMITY VENOUS DOPPLER ULTRASOUND TECHNIQUE: Gray-scale sonography with graded compression, as well as color Doppler and duplex ultrasound were performed to evaluate the lower extremity deep venous systems from the level of the common femoral vein and including the common femoral, femoral, profunda femoral, popliteal and calf veins including the posterior tibial, peroneal and gastrocnemius veins when visible. The superficial great saphenous vein was also interrogated. Spectral Doppler was utilized to evaluate flow at rest and with distal augmentation maneuvers in the common femoral, femoral and popliteal veins. COMPARISON:  None. FINDINGS: RIGHT LOWER EXTREMITY Common Femoral Vein: No evidence of thrombus. Normal compressibility, respiratory phasicity and response to augmentation. Saphenofemoral Junction: No evidence of thrombus. Normal compressibility and flow on color Doppler imaging. Profunda Femoral Vein: No evidence of thrombus. Normal compressibility and flow on color Doppler imaging. Femoral Vein: No evidence of thrombus. Normal compressibility, respiratory phasicity and response to augmentation. Popliteal Vein: No evidence of thrombus. Normal compressibility, respiratory phasicity and response to augmentation. Calf Veins: No evidence of thrombus in the posterior tibial vein. Normal compressibility and flow on color Doppler imaging. Peroneal vein not visualized. Superficial Great Saphenous Vein: No evidence of thrombus. Normal compressibility and flow on color Doppler imaging. Venous Reflux:  None. Other Findings:  None. LEFT LOWER EXTREMITY Common Femoral Vein: No evidence of thrombus. Normal compressibility, respiratory phasicity and response to augmentation. Saphenofemoral Junction: No evidence  of thrombus. Normal  compressibility and flow on color Doppler imaging. Profunda Femoral Vein: No evidence of thrombus. Normal compressibility and flow on color Doppler imaging. Femoral Vein: No evidence of thrombus. Normal compressibility, respiratory phasicity and response to augmentation. Popliteal Vein: No evidence of thrombus. Normal compressibility, respiratory phasicity and response to augmentation. Calf Veins: No evidence of thrombus in the posterior tibial vein. Normal compressibility and flow on color Doppler imaging. Perennial vein not visualized. Superficial Great Saphenous Vein: No evidence of thrombus. Normal compressibility and flow on color Doppler imaging. Venous Reflux:  None. Other Findings: Subcutaneous edema is noted. Physiologic appearing lymph nodes in the right groin. IMPRESSION: No evidence of bilateral lower extremity deep venous thrombosis. Electronically Signed   By: Jeb Levering M.D.   On: 03/19/2015 19:34       Today   Subjective:   John Reilly  feels well very anxious to go home  Objective:   Blood pressure 136/65, pulse 84, temperature 98.6 F (37 C), temperature source Oral, resp. rate 18, height 5\' 10"  (1.778 m), weight 88.905 kg (196 lb), SpO2 93 %.  .  Intake/Output Summary (Last 24 hours) at 03/21/15 1522 Last data filed at 03/21/15 1135  Gross per 24 hour  Intake    680 ml  Output   1325 ml  Net   -645 ml    Exam VITAL SIGNS: Blood pressure 136/65, pulse 84, temperature 98.6 F (37 C), temperature source Oral, resp. rate 18, height 5\' 10"  (1.778 m), weight 88.905 kg (196 lb), SpO2 93 %.  GENERAL:  59 y.o.-year-old patient lying in the bed with no acute distress.  EYES: Pupils equal, round, reactive to light and accommodation. No scleral icterus. Extraocular muscles intact.  HEENT: Head atraumatic, normocephalic. Oropharynx and nasopharynx clear.  NECK:  Supple, no jugular venous distention. No thyroid enlargement, no tenderness.  LUNGS: Normal breath sounds  bilaterally, no wheezing, rales,rhonchi or crepitation. No use of accessory muscles of respiration.  CARDIOVASCULAR: S1, S2 normal. No murmurs, rubs, or gallops.  ABDOMEN: Soft, nontender, nondistended. Bowel sounds present. No organomegaly or mass.  EXTREMITIES: No pedal edema, cyanosis, or clubbing. 1+ edema  NEUROLOGIC: Cranial nerves II through XII are intact. Muscle strength 5/5 in all extremities. Sensation intact. Gait not checked.  PSYCHIATRIC: The patient is alert and oriented x 3.  SKIN: No obvious rash, lesion, or ulcer.   Data Review     CBC w Diff:  Lab Results  Component Value Date   WBC 6.4 03/20/2015   HGB 10.5* 03/20/2015   HCT 30.9* 03/20/2015   PLT 244 03/20/2015   CMP:  Lab Results  Component Value Date   NA 128* 03/21/2015   K 3.7 03/21/2015   CL 89* 03/21/2015   CO2 30 03/21/2015   BUN 11 03/21/2015   CREATININE 0.78 03/21/2015   PROT 6.0* 03/21/2015   ALBUMIN 2.7* 03/21/2015   BILITOT 0.6 03/21/2015   ALKPHOS 108 03/21/2015   AST 34 03/21/2015   ALT 38 03/21/2015  .  Micro Results Recent Results (from the past 240 hour(s))  Blood culture (routine x 2)     Status: None (Preliminary result)   Collection Time: 03/15/15  9:45 PM  Result Value Ref Range Status   Specimen Description BLOOD RIGHT ASSIST CONTROL  Final   Special Requests BOTTLES DRAWN AEROBIC AND ANAEROBIC 5CC  Final   Culture NO GROWTH 4 DAYS  Final   Report Status PENDING  Incomplete  Blood culture (routine x 2)  Status: None (Preliminary result)   Collection Time: 03/15/15 10:15 PM  Result Value Ref Range Status   Specimen Description BLOOD LEFT ASSIST CONTROL  Final   Special Requests BOTTLES DRAWN AEROBIC AND ANAEROBIC 5CC  Final   Culture NO GROWTH 4 DAYS  Final   Report Status PENDING  Incomplete        Code Status Orders        Start     Ordered   03/15/15 2115  Full code   Continuous     03/15/15 2114              Follow-up Information    Follow up  with Lavonia Dana, MD In 1 day.   Specialty:  Internal Medicine   Why:  pt to go to dr. Juleen China office tomm to get sodium level checked   Contact information:   [address] [place] [address] [phone]       Discharge Medications     Medication List    TAKE these medications        amLODipine 10 MG tablet  Commonly known as:  NORVASC  Take 10 mg by mouth daily.     enalapril 20 MG tablet  Commonly known as:  VASOTEC  Take 20 mg by mouth 2 (two) times daily.     folic acid 1 MG tablet  Commonly known as:  FOLVITE  Take 1 mg by mouth daily.     furosemide 20 MG tablet  Commonly known as:  LASIX  Take 1 tablet (20 mg total) by mouth daily.     glucagon 1 MG injection  1 mg by Other route once as needed (for severe hypoglycemia).     hydrALAZINE 25 MG tablet  Commonly known as:  APRESOLINE  Take 25 mg by mouth 2 (two) times daily.     hydrochlorothiazide 25 MG tablet  Commonly known as:  HYDRODIURIL  Take 25 mg by mouth daily.     insulin pump Soln  Pt uses Humalog.     levofloxacin 500 MG tablet  Commonly known as:  LEVAQUIN  Take 1 tablet (500 mg total) by mouth every evening.     metoprolol succinate 50 MG 24 hr tablet  Commonly known as:  TOPROL-XL  Take 50 mg by mouth daily.     multivitamin with minerals Tabs tablet  Take 1 tablet by mouth daily.     omeprazole 40 MG capsule  Commonly known as:  PRILOSEC  Take 40 mg by mouth daily.           Total Time in preparing paper work, data evaluation and todays exam - 35 minutes  Dustin Flock M.D on 03/21/2015 at 3:22 PM  [facility]   Office  [phone]

## 2015-03-21 NOTE — Progress Notes (Signed)
[place] at [place] was admitted to the [facility] on 03/19/2015 and Discharged  03/21/2015 and should be excused from work/school   for 6 days starting 03/19/2015 , may return to work/school without any restrictions.  Call Dustin Flock MD with questions.  Dustin Flock M.D on 03/21/2015,at 12:56 PM  [place] at [facility]  [phone]

## 2015-03-25 LAB — CULTURE, BLOOD (ROUTINE X 2)
CULTURE: NO GROWTH
Culture: NO GROWTH

## 2015-04-25 ENCOUNTER — Encounter: Payer: Self-pay | Admitting: Podiatry

## 2015-04-25 ENCOUNTER — Ambulatory Visit (INDEPENDENT_AMBULATORY_CARE_PROVIDER_SITE_OTHER): Payer: BLUE CROSS/BLUE SHIELD | Admitting: Podiatry

## 2015-04-25 VITALS — BP 129/64 | HR 78 | Resp 18

## 2015-04-25 DIAGNOSIS — L97521 Non-pressure chronic ulcer of other part of left foot limited to breakdown of skin: Secondary | ICD-10-CM | POA: Diagnosis not present

## 2015-04-27 ENCOUNTER — Encounter: Payer: Self-pay | Admitting: Podiatry

## 2015-04-27 DIAGNOSIS — L97509 Non-pressure chronic ulcer of other part of unspecified foot with unspecified severity: Secondary | ICD-10-CM | POA: Insufficient documentation

## 2015-04-27 NOTE — Progress Notes (Signed)
Patient ID: John Reilly, male   DOB: 1956-05-01, 59 y.o.   MRN: [id number]  Subjective: Patient presented the office they for follow-up evaluation of an ulceration to her left big toe. He states he is doing better. He does continue with Silvadene and a dressing daily. He denies anxiety redness or red streaks. No drainage or posterior he doesn't the area uncovered at night. No other complaints at this time in no acute changes. He denies any systemic complaints such as fevers, chills, nausea, vomiting. No calf pain, chest pain, shortness of breath.  Objective: AAO 3, NAD DP/PT pulses 2/4, CRT less than 3 seconds On the plantar aspect the left hallux is continued hyperkeratotic lesion with underlying ulceration. Upon debridement the ulceration today appears to be very superficial appears to continue to be almost an abrasion type lesion. The area measures about 0.2 x 0.1 cm. There is no edema, erythema, ascending cellulitis, fluctuance, crepitus, malodor, drainage/purulence. No other open lesions or pre-ulcerative lesions identified at this time.. The hallux is a plantar flexed position for when the patient states that he broke his toe. Range of motion is limited to the first MTPJ and dorsiflexion. No other open lesions or pre-ulcer lesions identified bilaterally. No other areas of tenderness. There is no pain with calf compression, swelling, warmth, erythema.  Assessment: Follow-up evaluation left hallux ulceration, improving  Plan: Wound was debrided without complications all hyperkeratotic tissue. The wound appears to be healing and continues to be superficial. Continue Silvadene dressing changes in the day however he can leave the area uncovered when not wearing shoes and socks at home. Continue with offloading pads. Monitor for any clinical signs or symptoms of infection and directed to call the office immediately should any occur or go to the ER. Follow-up in 4 weeks if the wound continues however  it appears to be about healed at this time. Call sooner if any problems are to arise. Call any questions or concerns in the meantime.  Celesta Gentile, DPM

## 2015-05-30 ENCOUNTER — Ambulatory Visit: Payer: BLUE CROSS/BLUE SHIELD | Admitting: Podiatry

## 2016-10-09 ENCOUNTER — Encounter: Payer: Self-pay | Admitting: *Deleted

## 2016-10-12 ENCOUNTER — Encounter
Admission: RE | Disposition: A | Discharge status: Home / Self Care | Payer: Self-pay | Source: Ambulatory Visit | Attending: Unknown Physician Specialty

## 2016-10-12 ENCOUNTER — Encounter: Payer: Self-pay | Admitting: *Deleted

## 2016-10-12 ENCOUNTER — Ambulatory Visit
Admission: RE | Admit: 2016-10-12 | Discharge: 2016-10-12 | Disposition: A | Discharge status: Home / Self Care | Payer: BLUE CROSS/BLUE SHIELD | Source: Ambulatory Visit | Attending: Unknown Physician Specialty | Admitting: Unknown Physician Specialty

## 2016-10-12 ENCOUNTER — Ambulatory Visit: Payer: BLUE CROSS/BLUE SHIELD | Admitting: *Deleted

## 2016-10-12 DIAGNOSIS — K5730 Diverticulosis of large intestine without perforation or abscess without bleeding: Secondary | ICD-10-CM | POA: Diagnosis not present

## 2016-10-12 DIAGNOSIS — D127 Benign neoplasm of rectosigmoid junction: Secondary | ICD-10-CM | POA: Diagnosis not present

## 2016-10-12 DIAGNOSIS — Z880 Allergy status to penicillin: Secondary | ICD-10-CM | POA: Diagnosis not present

## 2016-10-12 DIAGNOSIS — D123 Benign neoplasm of transverse colon: Secondary | ICD-10-CM | POA: Diagnosis not present

## 2016-10-12 DIAGNOSIS — Z79899 Other long term (current) drug therapy: Secondary | ICD-10-CM | POA: Insufficient documentation

## 2016-10-12 DIAGNOSIS — Z794 Long term (current) use of insulin: Secondary | ICD-10-CM | POA: Insufficient documentation

## 2016-10-12 DIAGNOSIS — I10 Essential (primary) hypertension: Secondary | ICD-10-CM | POA: Insufficient documentation

## 2016-10-12 DIAGNOSIS — D122 Benign neoplasm of ascending colon: Secondary | ICD-10-CM | POA: Insufficient documentation

## 2016-10-12 DIAGNOSIS — K640 First degree hemorrhoids: Secondary | ICD-10-CM | POA: Diagnosis not present

## 2016-10-12 DIAGNOSIS — E1151 Type 2 diabetes mellitus with diabetic peripheral angiopathy without gangrene: Secondary | ICD-10-CM | POA: Insufficient documentation

## 2016-10-12 DIAGNOSIS — Z86010 Personal history of colonic polyps: Secondary | ICD-10-CM | POA: Diagnosis present

## 2016-10-12 DIAGNOSIS — J449 Chronic obstructive pulmonary disease, unspecified: Secondary | ICD-10-CM | POA: Insufficient documentation

## 2016-10-12 DIAGNOSIS — Z87891 Personal history of nicotine dependence: Secondary | ICD-10-CM | POA: Diagnosis not present

## 2016-10-12 DIAGNOSIS — K219 Gastro-esophageal reflux disease without esophagitis: Secondary | ICD-10-CM | POA: Diagnosis not present

## 2016-10-12 HISTORY — DX: Duodenal ulcer, unspecified as acute or chronic, without hemorrhage or perforation: K26.9

## 2016-10-12 HISTORY — DX: Duodenitis with bleeding: K29.81

## 2016-10-12 HISTORY — PX: COLONOSCOPY WITH PROPOFOL: SHX5780

## 2016-10-12 HISTORY — DX: Chronic obstructive pulmonary disease, unspecified: J44.9

## 2016-10-12 HISTORY — DX: Unspecified background retinopathy: H35.00

## 2016-10-12 HISTORY — DX: Peripheral vascular disease, unspecified: I73.9

## 2016-10-12 LAB — GLUCOSE, CAPILLARY
GLUCOSE-CAPILLARY: 173 mg/dL — AB (ref 65–99)
GLUCOSE-CAPILLARY: 121 mg/dL — AB (ref 65–99)

## 2016-10-12 SURGERY — COLONOSCOPY WITH PROPOFOL
Anesthesia: General

## 2016-10-12 MED ORDER — PROPOFOL 10 MG/ML IV BOLUS
INTRAVENOUS | Status: AC
  Filled 2016-10-12: qty 20

## 2016-10-12 MED ORDER — SODIUM CHLORIDE 0.9 % IV SOLN
INTRAVENOUS | Status: DC
  Administered 2016-10-12: 07:00:00 via INTRAVENOUS

## 2016-10-12 MED ORDER — PROPOFOL 10 MG/ML IV BOLUS: INTRAVENOUS | Status: DC | PRN

## 2016-10-12 MED ORDER — PROPOFOL 500 MG/50ML IV EMUL
INTRAVENOUS | Status: AC
  Filled 2016-10-12: qty 50

## 2016-10-12 MED ORDER — METOPROLOL SUCCINATE ER 50 MG PO TB24
ORAL_TABLET | ORAL | Status: AC
  Filled 2016-10-12: qty 1

## 2016-10-12 MED ORDER — METOPROLOL SUCCINATE ER 50 MG PO TB24
50.0000 mg | ORAL_TABLET | Freq: Once | ORAL | Status: AC
  Administered 2016-10-12: 50 mg via ORAL

## 2016-10-12 MED ORDER — SODIUM CHLORIDE 0.9 % IV SOLN
INTRAVENOUS | Status: DC
  Administered 2016-10-12: 08:00:00 via INTRAVENOUS

## 2016-10-12 MED ORDER — PROPOFOL 10 MG/ML IV BOLUS
INTRAVENOUS | Status: DC | PRN
  Administered 2016-10-12: 450 mg via INTRAVENOUS

## 2016-10-12 NOTE — H&P (Signed)
Primary Care Physician:  Glendon Axe, MD Primary Gastroenterologist:  Dr. Vira Agar  Pre-Procedure History & Physical: HPI:  John Reilly is a 61 y.o. male is here for an colonoscopy.   Past Medical History:  Diagnosis Date  . Cancer ([place])   . COPD (chronic obstructive pulmonary disease) ([place])   . Diabetes mellitus without complication ([place])   . GERD (gastroesophageal reflux disease)   . Hypertension   . Multiple duodenal ulcers   . Peripheral vascular disease (HCC)    Peripheral Neuropathy  . Retinopathy     Past Surgical History:  Procedure Laterality Date  . ANKLE ARTHROPLASTY     2013    Prior to Admission medications   Medication Sig Start Date End Date Taking? Authorizing Provider  enalapril (VASOTEC) 20 MG tablet Take 20 mg by mouth 2 (two) times daily.    Yes [provider]  folic acid (FOLVITE) 1 MG tablet Take 1 mg by mouth daily.   Yes [provider]  hydrALAZINE (APRESOLINE) 25 MG tablet Take 25 mg by mouth 2 (two) times daily.   Yes [provider]  metoprolol succinate (TOPROL-XL) 50 MG 24 hr tablet Take 50 mg by mouth daily.   Yes [provider]  Multiple Vitamin (MULTIVITAMIN WITH MINERALS) TABS tablet Take 1 tablet by mouth daily.   Yes [provider]  omeprazole (PRILOSEC) 40 MG capsule Take 40 mg by mouth daily.   Yes [provider]  furosemide (LASIX) 20 MG tablet Take 1 tablet (20 mg total) by mouth daily. 03/21/15   Dustin Flock, MD  glucagon 1 MG injection 1 mg by Other route once as needed (for severe hypoglycemia).     [provider]  Insulin Human (INSULIN PUMP) SOLN Pt uses Humalog.    [provider]    Allergies as of 07/27/2016 - Review Complete 04/25/2015  Allergen Reaction Noted  . Penicillins Other (See Comments) 08/28/2014    Family History  Problem Relation Age of Onset  . Stroke Mother   . Alzheimer's disease Father   . Diabetes Brother      Social History   Social History  . Marital status: Married    Spouse name: N/A  . Number of children: N/A  . Years of education: N/A   Occupational History  . Not on file.   Social History Main Topics  . Smoking status: Former Smoker    Quit date: 10/21/2005  . Smokeless tobacco: Never Used  . Alcohol use 25.2 oz/week    42 Cans of beer per week     Comment: patient states he drinks 1 quart of beer per day  . Drug use: No  . Sexual activity: Not on file   Other Topics Concern  . Not on file   Social History Narrative  . No narrative on file    Review of Systems: See HPI, otherwise negative ROS  Physical Exam: BP 138/87   Pulse 85   Temp 97.5 F (36.4 C) (Tympanic)   Resp 18   Ht 5\' 10"  (1.778 m)   Wt 85.7 kg (189 lb)   BMI 27.12 kg/m  General:   Alert,  pleasant and cooperative in NAD Head:  Normocephalic and atraumatic. Neck:  Supple; no masses or thyromegaly. Lungs:  Clear throughout to auscultation.    Heart:  Regular rate and rhythm. Abdomen:  Soft, nontender and nondistended. Normal bowel sounds, without guarding, and without rebound.  Devices to help control diabetes on  abd skin. Neurologic:  Alert and  oriented x4;  grossly normal neurologically.  Impression/Plan: John Reilly is here for an colonoscopy to be performed for [facility] colon cancer.  Risks, benefits, limitations, and alternatives regarding  colonoscopy have been reviewed with the patient.  Questions have been answered.  All parties agreeable.   Gaylyn Cheers, MD  10/12/2016, 7:33 AM

## 2016-10-12 NOTE — Op Note (Signed)
[facility] Patient Name: John Reilly Procedure Date: 10/12/2016 7:34 AM MRN: [id number] Account #: [id number] Date of Birth: [birth year] Admit Type: Outpatient Age: 61 Room: [facility] ENDO ROOM 1 Gender: Male Note Status: Finalized Procedure:            Colonoscopy Indications:          High risk colon cancer surveillance: Personal history                        of colonic polyps Providers:            Manya Silvas, MD Referring MD:         Glendon Axe (Referring MD) Medicines:            Propofol per Anesthesia Complications:        No immediate complications. Procedure:            Pre-Anesthesia Assessment:                       - After reviewing the risks and benefits, the patient                        was deemed in satisfactory condition to undergo the                        procedure.                       After obtaining informed consent, the colonoscope was                        passed under direct vision. Throughout the procedure,                        the patient's blood pressure, pulse, and oxygen                        saturations were monitored continuously. The                        Colonoscope was introduced through the anus and                        advanced to the the cecum, identified by appendiceal                        orifice and ileocecal valve. The colonoscopy was                        performed without difficulty. The patient tolerated the                        procedure well. The quality of the bowel preparation                        was excellent. Findings:      A diminutive polyp was found in the ascending colon. The polyp was       sessile. The polyp was removed with a jumbo cold forceps. Resection and       retrieval were complete.      Two sessile polyps were found in  the transverse colon. The polyps were       diminutive in size. These polyps were removed with a jumbo cold forceps.       Resection and  retrieval were complete.      A diminutive polyp was found in the recto-sigmoid colon. The polyp was       sessile. The polyp was removed with a hot snare. Resection and retrieval       were complete.      A few small-mouthed diverticula were found in the sigmoid colon.      Internal hemorrhoids were found during endoscopy. The hemorrhoids were       small and Grade I (internal hemorrhoids that do not prolapse). Impression:           - One diminutive polyp in the ascending colon, removed                        with a jumbo cold forceps. Resected and retrieved.                       - Two diminutive polyps in the transverse colon,                        removed with a jumbo cold forceps. Resected and                        retrieved.                       - One diminutive polyp at the recto-sigmoid colon,                        removed with a hot snare. Resected and retrieved.                       - Diverticulosis in the sigmoid colon.                       - Internal hemorrhoids. Recommendation:       - Await pathology results. Manya Silvas, MD 10/12/2016 8:11:02 AM This report has been signed electronically. Number of Addenda: 0 Note Initiated On: 10/12/2016 7:34 AM Scope Withdrawal Time: 0 hours 12 minutes 41 seconds  Total Procedure Duration: 0 hours 22 minutes 49 seconds       [facility]

## 2016-10-12 NOTE — Anesthesia Post-op Follow-up Note (Cosign Needed)
Anesthesia QCDR form completed.        

## 2016-10-12 NOTE — Anesthesia Postprocedure Evaluation (Signed)
Anesthesia Post Note  Patient: John Reilly  Procedure(s) Performed: Procedure(s) (LRB): COLONOSCOPY WITH PROPOFOL (N/A)  Patient location during evaluation: Endoscopy Anesthesia Type: General Level of consciousness: awake and alert Pain management: pain level controlled Vital Signs Assessment: post-procedure vital signs reviewed and stable Respiratory status: spontaneous breathing and respiratory function stable Cardiovascular status: stable Anesthetic complications: no     Last Vitals:  Vitals:   10/12/16 0700 10/12/16 0809  BP: 138/87 (!) 100/56  Pulse: 85 75  Resp: 18 (!) 21  Temp: 36.4 C 36.3 C    Last Pain:  Vitals:   10/12/16 0809  TempSrc: Tympanic                 Benzion Mesta K

## 2016-10-12 NOTE — Transfer of Care (Signed)
Immediate Anesthesia Transfer of Care Note  Patient: John Reilly  Procedure(s) Performed: Procedure(s): COLONOSCOPY WITH PROPOFOL (N/A)  Patient Location: PACU  Anesthesia Type:General  Level of Consciousness: awake, alert  and oriented  Airway & Oxygen Therapy: Patient Spontanous Breathing and Patient connected to face mask oxygen  Post-op Assessment: Report given to RN and Post -op Vital signs reviewed and stable  Post vital signs: Reviewed and stable  Last Vitals:  Vitals:   10/12/16 0700  BP: 138/87  Pulse: 85  Resp: 18  Temp: 36.4 C    Last Pain:  Vitals:   10/12/16 0700  TempSrc: Tympanic         Complications: No apparent anesthesia complications

## 2016-10-12 NOTE — Anesthesia Preprocedure Evaluation (Signed)
Anesthesia Evaluation  Patient identified by MRN, date of birth, ID band Patient awake    Reviewed: Allergy & Precautions, NPO status , Patient's Chart, lab work & pertinent test results  History of Anesthesia Complications Negative for: history of anesthetic complications  Airway Mallampati: II       Dental  (+) Partial Upper   Pulmonary COPD (pt denies), former smoker,           Cardiovascular hypertension, Pt. on medications and Pt. on home beta blockers + Peripheral Vascular Disease       Neuro/Psych negative neurological ROS     GI/Hepatic GERD  Medicated and Controlled,(+)     substance abuse  alcohol use,   Endo/Other  diabetes, Type 1, Insulin Dependent  Renal/GU      Musculoskeletal   Abdominal   Peds  Hematology   Anesthesia Other Findings   Reproductive/Obstetrics                             Anesthesia Physical Anesthesia Plan  ASA: III  Anesthesia Plan: General   Post-op Pain Management:    Induction: Intravenous  Airway Management Planned: Nasal Cannula  Additional Equipment:   Intra-op Plan:   Post-operative Plan:   Informed Consent: I have reviewed the patients History and Physical, chart, labs and discussed the procedure including the risks, benefits and alternatives for the proposed anesthesia with the patient or authorized representative who has indicated his/her understanding and acceptance.     Plan Discussed with:   Anesthesia Plan Comments:         Anesthesia Quick Evaluation

## 2016-10-13 ENCOUNTER — Encounter: Payer: Self-pay | Admitting: Unknown Physician Specialty

## 2016-10-13 LAB — SURGICAL PATHOLOGY

## 2016-12-09 ENCOUNTER — Other Ambulatory Visit: Payer: Self-pay | Admitting: Nurse Practitioner

## 2016-12-09 DIAGNOSIS — G20 Parkinson's disease: Secondary | ICD-10-CM

## 2016-12-16 ENCOUNTER — Other Ambulatory Visit
Admission: RE | Admit: 2016-12-16 | Discharge: 2016-12-16 | Disposition: A | Discharge status: Home / Self Care | Payer: BLUE CROSS/BLUE SHIELD | Source: Ambulatory Visit | Attending: Nurse Practitioner | Admitting: Nurse Practitioner

## 2016-12-16 ENCOUNTER — Ambulatory Visit
Admission: RE | Admit: 2016-12-16 | Discharge: 2016-12-16 | Disposition: A | Discharge status: Home / Self Care | Payer: BLUE CROSS/BLUE SHIELD | Source: Ambulatory Visit | Attending: Nurse Practitioner | Admitting: Nurse Practitioner

## 2016-12-16 DIAGNOSIS — Z01812 Encounter for preprocedural laboratory examination: Secondary | ICD-10-CM | POA: Insufficient documentation

## 2016-12-16 DIAGNOSIS — G20 Parkinson's disease: Secondary | ICD-10-CM | POA: Diagnosis present

## 2016-12-16 DIAGNOSIS — G319 Degenerative disease of nervous system, unspecified: Secondary | ICD-10-CM | POA: Insufficient documentation

## 2016-12-16 LAB — CREATININE, SERUM: Creatinine, Ser: 0.75 mg/dL (ref 0.61–1.24)

## 2016-12-16 MED ORDER — GADOBENATE DIMEGLUMINE 529 MG/ML IV SOLN
20.0000 mL | Freq: Once | INTRAVENOUS | Status: AC | PRN
  Administered 2016-12-16: 17 mL via INTRAVENOUS

## 2017-10-02 ENCOUNTER — Emergency Department: Payer: BLUE CROSS/BLUE SHIELD

## 2017-10-02 ENCOUNTER — Encounter: Payer: Self-pay | Admitting: Emergency Medicine

## 2017-10-02 ENCOUNTER — Inpatient Hospital Stay
Admission: EM | Admit: 2017-10-02 | Discharge: 2017-10-04 | DRG: 563 | Disposition: A | Discharge status: Home Health | Payer: BLUE CROSS/BLUE SHIELD | Attending: Internal Medicine | Admitting: Internal Medicine

## 2017-10-02 ENCOUNTER — Other Ambulatory Visit: Payer: Self-pay

## 2017-10-02 DIAGNOSIS — K219 Gastro-esophageal reflux disease without esophagitis: Secondary | ICD-10-CM | POA: Diagnosis present

## 2017-10-02 DIAGNOSIS — J449 Chronic obstructive pulmonary disease, unspecified: Secondary | ICD-10-CM | POA: Diagnosis present

## 2017-10-02 DIAGNOSIS — E1051 Type 1 diabetes mellitus with diabetic peripheral angiopathy without gangrene: Secondary | ICD-10-CM | POA: Diagnosis present

## 2017-10-02 DIAGNOSIS — Z79899 Other long term (current) drug therapy: Secondary | ICD-10-CM

## 2017-10-02 DIAGNOSIS — E861 Hypovolemia: Secondary | ICD-10-CM | POA: Diagnosis present

## 2017-10-02 DIAGNOSIS — S4292XA Fracture of left shoulder girdle, part unspecified, initial encounter for closed fracture: Secondary | ICD-10-CM

## 2017-10-02 DIAGNOSIS — E876 Hypokalemia: Secondary | ICD-10-CM | POA: Diagnosis present

## 2017-10-02 DIAGNOSIS — R531 Weakness: Secondary | ICD-10-CM

## 2017-10-02 DIAGNOSIS — I10 Essential (primary) hypertension: Secondary | ICD-10-CM | POA: Diagnosis present

## 2017-10-02 DIAGNOSIS — Z6822 Body mass index (BMI) 22.0-22.9, adult: Secondary | ICD-10-CM

## 2017-10-02 DIAGNOSIS — E440 Moderate protein-calorie malnutrition: Secondary | ICD-10-CM | POA: Diagnosis present

## 2017-10-02 DIAGNOSIS — W19XXXA Unspecified fall, initial encounter: Secondary | ICD-10-CM

## 2017-10-02 DIAGNOSIS — S2231XA Fracture of one rib, right side, initial encounter for closed fracture: Secondary | ICD-10-CM | POA: Diagnosis present

## 2017-10-02 DIAGNOSIS — Z87891 Personal history of nicotine dependence: Secondary | ICD-10-CM

## 2017-10-02 DIAGNOSIS — G20 Parkinson's disease: Secondary | ICD-10-CM | POA: Diagnosis present

## 2017-10-02 DIAGNOSIS — S42292A Other displaced fracture of upper end of left humerus, initial encounter for closed fracture: Secondary | ICD-10-CM | POA: Diagnosis not present

## 2017-10-02 DIAGNOSIS — E871 Hypo-osmolality and hyponatremia: Secondary | ICD-10-CM | POA: Diagnosis present

## 2017-10-02 DIAGNOSIS — E10649 Type 1 diabetes mellitus with hypoglycemia without coma: Secondary | ICD-10-CM | POA: Diagnosis present

## 2017-10-02 DIAGNOSIS — S2232XA Fracture of one rib, left side, initial encounter for closed fracture: Secondary | ICD-10-CM

## 2017-10-02 DIAGNOSIS — W1830XA Fall on same level, unspecified, initial encounter: Secondary | ICD-10-CM | POA: Diagnosis present

## 2017-10-02 DIAGNOSIS — S42309A Unspecified fracture of shaft of humerus, unspecified arm, initial encounter for closed fracture: Secondary | ICD-10-CM | POA: Diagnosis present

## 2017-10-02 DIAGNOSIS — E10319 Type 1 diabetes mellitus with unspecified diabetic retinopathy without macular edema: Secondary | ICD-10-CM | POA: Diagnosis present

## 2017-10-02 LAB — URINALYSIS, COMPLETE (UACMP) WITH MICROSCOPIC
BILIRUBIN URINE: NEGATIVE
Bacteria, UA: NONE SEEN
HGB URINE DIPSTICK: NEGATIVE
KETONES UR: 5 mg/dL — AB
LEUKOCYTES UA: NEGATIVE
NITRITE: NEGATIVE
PROTEIN: NEGATIVE mg/dL
Specific Gravity, Urine: 1.005 (ref 1.005–1.030)
pH: 6 (ref 5.0–8.0)

## 2017-10-02 LAB — CBC
HCT: 29.8 % — ABNORMAL LOW (ref 40.0–52.0)
Hemoglobin: 10.3 g/dL — ABNORMAL LOW (ref 13.0–18.0)
MCH: 35.2 pg — AB (ref 26.0–34.0)
MCHC: 34.7 g/dL (ref 32.0–36.0)
MCV: 101.4 fL — ABNORMAL HIGH (ref 80.0–100.0)
PLATELETS: 227 10*3/uL (ref 150–440)
RBC: 2.94 MIL/uL — AB (ref 4.40–5.90)
RDW: 14.5 % (ref 11.5–14.5)
WBC: 6.7 10*3/uL (ref 3.8–10.6)

## 2017-10-02 LAB — COMPREHENSIVE METABOLIC PANEL
ANION GAP: 12 (ref 5–15)
AST: 20 U/L (ref 15–41)
Albumin: 2.5 g/dL — ABNORMAL LOW (ref 3.5–5.0)
Alkaline Phosphatase: 137 U/L — ABNORMAL HIGH (ref 38–126)
BUN: <5 mg/dL — ABNORMAL LOW (ref 6–20)
CHLORIDE: 88 mmol/L — AB (ref 101–111)
CO2: 25 mmol/L (ref 22–32)
CREATININE: 0.49 mg/dL — AB (ref 0.61–1.24)
Calcium: 6.5 mg/dL — ABNORMAL LOW (ref 8.9–10.3)
GFR calc non Af Amer: >60 mL/min (ref >60)
Glucose, Bld: 163 mg/dL — ABNORMAL HIGH (ref 65–99)
POTASSIUM: 2.3 mmol/L — AB (ref 3.5–5.1)
SODIUM: 125 mmol/L — AB (ref 135–145)
Total Bilirubin: 0.7 mg/dL (ref 0.3–1.2)
Total Protein: 5.2 g/dL — ABNORMAL LOW (ref 6.5–8.1)

## 2017-10-02 LAB — GLUCOSE, CAPILLARY
GLUCOSE-CAPILLARY: 90 mg/dL (ref 65–99)
Glucose-Capillary: 167 mg/dL — ABNORMAL HIGH (ref 65–99)

## 2017-10-02 LAB — TROPONIN I: Troponin I: <0.03 ng/mL (ref <0.03)

## 2017-10-02 MED ORDER — FENTANYL CITRATE (PF) 100 MCG/2ML IJ SOLN
25.0000 ug | Freq: Once | INTRAMUSCULAR | Status: AC
  Administered 2017-10-02: 25 ug via INTRAVENOUS
  Filled 2017-10-02: qty 2

## 2017-10-02 MED ORDER — POTASSIUM CHLORIDE 10 MEQ/100ML IV SOLN
10.0000 meq | Freq: Once | INTRAVENOUS | Status: AC
  Administered 2017-10-03: 10 meq via INTRAVENOUS
  Filled 2017-10-02: qty 100

## 2017-10-02 MED ORDER — POTASSIUM CHLORIDE CRYS ER 20 MEQ PO TBCR
40.0000 meq | EXTENDED_RELEASE_TABLET | Freq: Once | ORAL | Status: AC
  Administered 2017-10-02: 40 meq via ORAL
  Filled 2017-10-02: qty 2

## 2017-10-02 MED ORDER — SODIUM CHLORIDE 0.9 % IV BOLUS
1000.0000 mL | Freq: Once | INTRAVENOUS | Status: AC
  Administered 2017-10-02: 1000 mL via INTRAVENOUS

## 2017-10-02 NOTE — ED Triage Notes (Signed)
Pt arrives via [facility] from home with complaints of generalized weakness since this morning. Per wife, pt is not acting normally. Pt CBG upon EMS arrival was 35. Pt given 2 of his home glucose liquid solutions. Brought blood sugar up to 45 on scene and was 125 when he got into the ambulance.   Pt fell yesterday and hit left shoulder. Currently holding left arm and has limited movement upwards with it.   Pt has hx Parkinsons & Type I diabetic. Pt has insulin pump but per pt "has not been calibrated."

## 2017-10-02 NOTE — ED Provider Notes (Signed)
[facility] Emergency Department Provider Note   ____________________________________________   First MD Initiated Contact with Patient 10/02/17 2306     (approximate)  I have reviewed the triage vital signs and the nursing notes.   HISTORY  Chief Complaint Weakness and Hypoglycemia    HPI John Reilly is a 62 y.o. male brought to the ED from home via EMS with a chief complaint of generalized weakness and fall. Patient has a history of type 1 diabetes with insulin, COPD, hypertension, Parkinson's dementia whohad a mechanical fall last evening. Patient struck his left head/face as well as his shoulder. Family tried to bring him to the ED last night but he refused. Wife states patient has not gotten out of the bed today secondary to generalized weakness. Seems altered. EMS reports blood sugar upon their arrival was 35. Given home glucose liquid solutions andarrives to the ED with blood sugar of 90. Other than mechanical fall, patient has been in his baseline state of health. Denies recent fever, chills, chest pain, shortness of breath, abdominal pain, nausea, vomiting, dysuria, diarrhea. Denies recent travel. Denies use of anticoagulants.    Past Medical History:  Diagnosis Date  . Cancer ([place])    Pt reports on 10/02/17 that he has never been dx with cancer  . COPD (chronic obstructive pulmonary disease) ([place])   . Diabetes mellitus without complication ([place])   . GERD (gastroesophageal reflux disease)   . Hypertension   . Multiple duodenal ulcers   . Peripheral vascular disease (HCC)    Peripheral Neuropathy  . Retinopathy     Patient Active Problem List   Diagnosis Date Noted  . Ulcer of toe ([place]) 04/27/2015  . Hyponatremia 03/15/2015    Past Surgical History:  Procedure Laterality Date  . ANKLE ARTHROPLASTY     2013  . COLONOSCOPY WITH PROPOFOL N/A 10/12/2016   Procedure: COLONOSCOPY WITH PROPOFOL;  Surgeon: Manya Silvas, MD;  Location: [facility]  ENDOSCOPY;  Service: Endoscopy;  Laterality: N/A;    Prior to Admission medications   Medication Sig Start Date End Date Taking? Authorizing Provider  B Complex-Biotin-FA (SUPER B-COMPLEX) TABS Take 1 tablet by mouth daily.   Yes [provider]  carbidopa-levodopa (SINEMET CR) 50-200 MG tablet Take 1 tablet by mouth at bedtime. 07/22/17  Yes [provider]  carbidopa-levodopa (SINEMET IR) 25-100 MG tablet Take 1 tablet by mouth 3 (three) times daily. 04/22/17 10/19/17 Yes [provider]  enalapril (VASOTEC) 20 MG tablet Take 20 mg by mouth 2 (two) times daily.    Yes [provider]  folic acid (FOLVITE) 1 MG tablet Take 1 mg by mouth daily.   Yes [provider]  furosemide (LASIX) 20 MG tablet Take 1 tablet (20 mg total) by mouth daily. 03/21/15  Yes Dustin Flock, MD  glucagon 1 MG injection 1 mg by Other route once as needed (for severe hypoglycemia).    Yes [provider]  Insulin Human (INSULIN PUMP) SOLN Pt uses Humalog.   Yes [provider]  metoprolol succinate (TOPROL-XL) 100 MG 24 hr tablet Take 100 mg by mouth daily.    Yes [provider]  Multiple Vitamin (MULTIVITAMIN WITH MINERALS) TABS tablet Take 1 tablet by mouth daily.   Yes [provider]  omeprazole (PRILOSEC) 40 MG capsule Take 40 mg by mouth daily.   Yes [provider]  hydrALAZINE (APRESOLINE) 25 MG tablet Take 25 mg by mouth 2 (two) times daily.  [provider]    Allergies Penicillins  Family History  Problem Relation Age of Onset  . Stroke Mother   . Alzheimer's disease Father   . Diabetes Brother     Social History Social History   Tobacco Use  . Smoking status: Former Smoker    Last attempt to quit: 10/21/2005    Years since quitting: 11.9  . Smokeless tobacco: Never Used  Substance Use Topics  . Alcohol use: Yes    Alcohol/week: 25.2 oz    Types: 42 Cans of beer per week    Comment:  patient states he drinks 1 quart of beer per day  . Drug use: No    Review of Systems  Constitutional: Positive for generalized weakness. No fever/chills. Eyes: No visual changes. ENT: No sore throat. Cardiovascular: Denies chest pain. Respiratory: Denies shortness of breath. Gastrointestinal: No abdominal pain.  No nausea, no vomiting.  No diarrhea.  No constipation. Genitourinary: Negative for dysuria. Musculoskeletal: Positive for left shoulder pain. Negative for back pain. Skin: Negative for rash. Neurological: Negative for headaches, focal weakness or numbness.   ____________________________________________   PHYSICAL EXAM:  VITAL SIGNS: ED Triage Vitals  Enc Vitals Group     BP 10/02/17 2219 100/66     Pulse Rate 10/02/17 2219 95     Resp 10/02/17 2219 12     Temp 10/02/17 2219 97.6 F (36.4 C)     Temp Source 10/02/17 2219 Oral     SpO2 10/02/17 2208 98 %     Weight 10/02/17 2214 175 lb (79.4 kg)     Height 10/02/17 2214 5\' 10"  (1.778 m)     Head Circumference --      Peak Flow --      Pain Score 10/02/17 2213 8     Pain Loc --      Pain Edu? --      Excl. in [place]? --     Constitutional: Alert and oriented. Well appearing and in no acute distress. Eyes: Conjunctivae are normal. PERRL. EOMI. Head: Old eccymosis to left head. Nose: No congestion/rhinnorhea. Mouth/Throat: Mucous membranes are moist.  Oropharynx non-erythematous. Neck: No stridor.  No cervical spine tenderness to palpation. Cardiovascular: Normal rate, regular rhythm. Grossly normal heart sounds.  Good peripheral circulation. Respiratory: Normal respiratory effort.  No retractions. Lungs CTAB. Gastrointestinal: Soft and nontender. No distention. No abdominal bruits. No CVA tenderness. Musculoskeletal: Left shoulder with old eccymosis. Limited ROM secondary to pain. 2+ radial pulse. Brisk, less than 5 second capillary refill. Loose ring on 4th digit. No spinal tenderness to palpation. Pelvis  stable. No lower extremity tenderness nor edema.  No joint effusions. Neurologic:  Normal speech and language. No gross focal neurologic deficits are appreciated.  Skin:  Skin is warm, dry and intact. No rash noted. Psychiatric: Mood and affect are normal. Speech and behavior are normal.  ____________________________________________   LABS (all labs ordered are listed, but only abnormal results are displayed)  Labs Reviewed  CBC - Abnormal; Notable for the following components:      Result Value   RBC 2.94 (*)    Hemoglobin 10.3 (*)    HCT 29.8 (*)    MCV 101.4 (*)    MCH 35.2 (*)    All other components within normal limits  COMPREHENSIVE METABOLIC PANEL - Abnormal; Notable for the following components:   Sodium 125 (*)    Potassium 2.3 (*)    Chloride 88 (*)    Glucose, Bld 163 (*)  BUN <5 (*)    Creatinine, Ser 0.49 (*)    Calcium 6.5 (*)    Total Protein 5.2 (*)    Albumin 2.5 (*)    ALT <5 (*)    Alkaline Phosphatase 137 (*)    All other components within normal limits  URINALYSIS, COMPLETE (UACMP) WITH MICROSCOPIC - Abnormal; Notable for the following components:   Color, Urine YELLOW (*)    APPearance CLEAR (*)    Glucose, UA >=500 (*)    Ketones, ur 5 (*)    All other components within normal limits  GLUCOSE, CAPILLARY - Abnormal; Notable for the following components:   Glucose-Capillary 167 (*)    All other components within normal limits  TROPONIN I  GLUCOSE, CAPILLARY  CBG MONITORING, ED  CBG MONITORING, ED   ____________________________________________  EKG  ED ECG REPORT I, SUNG,JADE J, the attending physician, personally viewed and interpreted this ECG.   Date: 10/02/2017  EKG Time: ED ECG REPORT I, SUNG,JADE J, the attending physician, personally viewed and interpreted this ECG.   Date: 10/02/2017  EKG Time: 2218  Rate: 98  Rhythm: normal EKG, normal sinus rhythm  Axis: Normal  Intervals:none  ST&T Change:  Nonspecific   ____________________________________________  RADIOLOGY  ED MD interpretation: Right second rib fracture on chest x-ray, left shoulder x-ray reveals comminuted humerus fracture; no acute ICH  Official radiology report(s): Dg Chest 1 View  Result Date: 10/02/2017 CLINICAL DATA:  Generalized weakness, recent history of fall EXAM: CHEST  1 VIEW COMPARISON:  03/19/2015 FINDINGS: No focal consolidation or effusion. Scarring at the left lung base. Stable cardiomediastinal silhouette. No pneumothorax. Old left-sided rib fracture. Suspected acute displaced right second rib fracture. Mildly displaced fracture involving the proximal shaft of the left humerus. IMPRESSION: 1. Mild scarring at the left lung base.  No acute opacity 2. Acute mildly displaced fracture proximal shaft of the left humerus. Acute appearing displaced right second rib fracture. Electronically Signed   By: Donavan Foil M.D.   On: 10/02/2017 23:50   Ct Head Wo Contrast  Result Date: 10/03/2017 CLINICAL DATA:  Fall with bruising to the left eye EXAM: CT HEAD WITHOUT CONTRAST TECHNIQUE: Contiguous axial images were obtained from the base of the skull through the vertex without intravenous contrast. COMPARISON:  MRI 12/16/2016 FINDINGS: Brain: No acute territorial infarction, hemorrhage or intracranial mass is visualized. Moderate atrophy. Slight asymmetric enlargement of left anterior convexity extra-axial CSF density. Mild small vessel ischemic changes of the white matter. Nonenlarged ventricles. Vascular: No hyperdense vessels. Scattered calcifications at the carotid siphon Skull: Normal. Negative for fracture or focal lesion. Sinuses/Orbits: No acute finding. Other: Mild left periorbital soft tissue swelling IMPRESSION: 1. No CT evidence for acute intracranial abnormality. 2. Atrophy and mild small vessel ischemic changes of the white matter 3. Slight asymmetrical enlargement of left anterior convexity extra-axial CSF  density, possible small chronic subdural hygroma. Electronically Signed   By: Donavan Foil M.D.   On: 10/03/2017 00:04   Dg Shoulder Left  Result Date: 10/02/2017 CLINICAL DATA:  Generalized weakness since this morning. Patient fell yesterday and hit the left shoulder. Limited movement. EXAM: LEFT SHOULDER - 2+ VIEW COMPARISON:  None. FINDINGS: Comminuted fractures of the left humeral head and neck with transverse fracture line across the surgical neck and vertical fracture lines extending to the greater tuberosity. There is impaction of fracture fragments with lateral angulation of the distal humeral fracture fragment. No dislocation at the shoulder joint. Multiple tiny displaced fragments are visualized.  Visualized clavicle, scapula, and acromion appear intact. IMPRESSION: Comminuted fractures of the left humeral head and neck with impaction of fracture fragments and lateral angulation of the distal fracture fragment. Electronically Signed   By: Lucienne Capers M.D.   On: 10/02/2017 23:47    ____________________________________________   PROCEDURES  Procedure(s) performed: None  Procedures  Critical Care performed: Yes, see critical care note(s)   CRITICAL CARE Performed by: Paulette Blanch   Total critical care time: 30 minutes  Critical care time was exclusive of separately billable procedures and treating other patients.  Critical care was necessary to treat or prevent imminent or life-threatening deterioration.  Critical care was time spent personally by me on the following activities: development of treatment plan with patient and/or surrogate as well as nursing, discussions with consultants, evaluation of patient's response to treatment, examination of patient, obtaining history from patient or surrogate, ordering and performing treatments and interventions, ordering and review of laboratory studies, ordering and review of radiographic studies, pulse oximetry and re-evaluation of  patient's condition.  ____________________________________________   INITIAL IMPRESSION / ASSESSMENT AND PLAN / ED COURSE  As part of my medical decision making, I reviewed the following data within the [place] History obtained from family, Nursing notes reviewed and incorporated, Labs reviewed, EKG interpreted, Old chart reviewed, Radiograph reviewed, Discussed with admitting physician  and Notes from prior ED visits   62 year old male who presents with generalized weakness, ams and fall. Differential diagnosis includes, but is not limited to, alcohol, illicit or prescription medications, or other toxic ingestion; intracranial pathology such as stroke or intracerebral hemorrhage; fever or infectious causes including sepsis; hypoxemia and/or hypercarbia; uremia; trauma; endocrine related disorders such as diabetes, hypoglycemia, and thyroid-related diseases; hypertensive encephalopathy; etc.  Insulin pump has auto shut-off feature for BS less than 50. Will obtain CT head, left shoulder xrays to evaluate for traumatic injuries s/p fall from last night. Labs reveal stable anemia, hyponatremia, and hypokalemia. Will replete KCl both orally as well as via IV. Anticipate hospitalization if CT is negative for ICH.   Clinical Course as of Oct 03 12  Nancy Fetter Oct 03, 2017  0013 Updated patient and family of CT and x-ray results.  Will place in his left shoulder sling.  Spoke with hospitalist Dr. Duane Boston who will evaluate patient in the emergency department for admission.   [JS]    Clinical Course User Index [JS] Paulette Blanch, MD     ____________________________________________   FINAL CLINICAL IMPRESSION(S) / ED DIAGNOSES  Final diagnoses:  Hypokalemia  Generalized weakness  Fall, initial encounter  Hyponatremia  Shoulder fracture, left, closed, initial encounter  Closed fracture of one rib of left side, initial encounter     ED Discharge Orders    None        Note:  This document was prepared using Dragon voice recognition software and may include unintentional dictation errors.     Paulette Blanch, MD 10/03/17 [phone]

## 2017-10-02 NOTE — ED Notes (Signed)
Pt given ice cream and crackers, per request from EDP for pt to eat d/t hypoglycemia.

## 2017-10-02 NOTE — ED Notes (Signed)
Patient transported to CT 

## 2017-10-02 NOTE — ED Notes (Signed)
ED Provider at bedside. 

## 2017-10-03 DIAGNOSIS — I10 Essential (primary) hypertension: Secondary | ICD-10-CM | POA: Diagnosis present

## 2017-10-03 DIAGNOSIS — Z87891 Personal history of nicotine dependence: Secondary | ICD-10-CM | POA: Diagnosis not present

## 2017-10-03 DIAGNOSIS — K219 Gastro-esophageal reflux disease without esophagitis: Secondary | ICD-10-CM | POA: Diagnosis present

## 2017-10-03 DIAGNOSIS — E871 Hypo-osmolality and hyponatremia: Secondary | ICD-10-CM | POA: Diagnosis present

## 2017-10-03 DIAGNOSIS — E440 Moderate protein-calorie malnutrition: Secondary | ICD-10-CM | POA: Diagnosis present

## 2017-10-03 DIAGNOSIS — E861 Hypovolemia: Secondary | ICD-10-CM | POA: Diagnosis present

## 2017-10-03 DIAGNOSIS — G20 Parkinson's disease: Secondary | ICD-10-CM | POA: Diagnosis present

## 2017-10-03 DIAGNOSIS — S42292A Other displaced fracture of upper end of left humerus, initial encounter for closed fracture: Secondary | ICD-10-CM | POA: Diagnosis present

## 2017-10-03 DIAGNOSIS — E10649 Type 1 diabetes mellitus with hypoglycemia without coma: Secondary | ICD-10-CM | POA: Diagnosis present

## 2017-10-03 DIAGNOSIS — S42309A Unspecified fracture of shaft of humerus, unspecified arm, initial encounter for closed fracture: Secondary | ICD-10-CM | POA: Diagnosis present

## 2017-10-03 DIAGNOSIS — S2231XA Fracture of one rib, right side, initial encounter for closed fracture: Secondary | ICD-10-CM | POA: Diagnosis present

## 2017-10-03 DIAGNOSIS — W1830XA Fall on same level, unspecified, initial encounter: Secondary | ICD-10-CM | POA: Diagnosis present

## 2017-10-03 DIAGNOSIS — E876 Hypokalemia: Secondary | ICD-10-CM | POA: Diagnosis present

## 2017-10-03 DIAGNOSIS — E1051 Type 1 diabetes mellitus with diabetic peripheral angiopathy without gangrene: Secondary | ICD-10-CM | POA: Diagnosis present

## 2017-10-03 DIAGNOSIS — J449 Chronic obstructive pulmonary disease, unspecified: Secondary | ICD-10-CM | POA: Diagnosis present

## 2017-10-03 DIAGNOSIS — E10319 Type 1 diabetes mellitus with unspecified diabetic retinopathy without macular edema: Secondary | ICD-10-CM | POA: Diagnosis present

## 2017-10-03 DIAGNOSIS — Z79899 Other long term (current) drug therapy: Secondary | ICD-10-CM | POA: Diagnosis not present

## 2017-10-03 DIAGNOSIS — Z6822 Body mass index (BMI) 22.0-22.9, adult: Secondary | ICD-10-CM | POA: Diagnosis not present

## 2017-10-03 LAB — BASIC METABOLIC PANEL
Anion gap: 10 (ref 5–15)
Anion gap: 6 (ref 5–15)
Anion gap: 9 (ref 5–15)
BUN: 7 mg/dL (ref 6–20)
BUN: 9 mg/dL (ref 6–20)
BUN: 10 mg/dL (ref 6–20)
CALCIUM: 6.5 mg/dL — AB (ref 8.9–10.3)
CHLORIDE: 96 mmol/L — AB (ref 101–111)
CHLORIDE: 95 mmol/L — AB (ref 101–111)
CO2: 25 mmol/L (ref 22–32)
CO2: 25 mmol/L (ref 22–32)
CO2: 24 mmol/L (ref 22–32)
CREATININE: 0.59 mg/dL — AB (ref 0.61–1.24)
Calcium: 6.6 mg/dL — ABNORMAL LOW (ref 8.9–10.3)
Calcium: 6.9 mg/dL — ABNORMAL LOW (ref 8.9–10.3)
Chloride: 90 mmol/L — ABNORMAL LOW (ref 101–111)
Creatinine, Ser: 0.78 mg/dL (ref 0.61–1.24)
Creatinine, Ser: 0.65 mg/dL (ref 0.61–1.24)
GFR calc Af Amer: >60 mL/min (ref >60)
GFR calc Af Amer: >60 mL/min (ref >60)
GFR calc non Af Amer: >60 mL/min (ref >60)
GFR calc non Af Amer: >60 mL/min (ref >60)
GFR calc non Af Amer: >60 mL/min (ref >60)
Glucose, Bld: 355 mg/dL — ABNORMAL HIGH (ref 65–99)
Glucose, Bld: 192 mg/dL — ABNORMAL HIGH (ref 65–99)
Glucose, Bld: 219 mg/dL — ABNORMAL HIGH (ref 65–99)
POTASSIUM: 4.2 mmol/L (ref 3.5–5.1)
POTASSIUM: 3.9 mmol/L (ref 3.5–5.1)
POTASSIUM: 3.8 mmol/L (ref 3.5–5.1)
SODIUM: 125 mmol/L — AB (ref 135–145)
SODIUM: 127 mmol/L — AB (ref 135–145)
SODIUM: 128 mmol/L — AB (ref 135–145)

## 2017-10-03 LAB — CBC
HCT: 30.8 % — ABNORMAL LOW (ref 40.0–52.0)
HEMOGLOBIN: 10.6 g/dL — AB (ref 13.0–18.0)
MCH: 35.1 pg — AB (ref 26.0–34.0)
MCHC: 34.4 g/dL (ref 32.0–36.0)
MCV: 101.9 fL — ABNORMAL HIGH (ref 80.0–100.0)
Platelets: 227 10*3/uL (ref 150–440)
RBC: 3.02 MIL/uL — ABNORMAL LOW (ref 4.40–5.90)
RDW: 14.3 % (ref 11.5–14.5)
WBC: 7.5 10*3/uL (ref 3.8–10.6)

## 2017-10-03 LAB — GLUCOSE, CAPILLARY
GLUCOSE-CAPILLARY: 349 mg/dL — AB (ref 65–99)
GLUCOSE-CAPILLARY: 225 mg/dL — AB (ref 65–99)
Glucose-Capillary: 231 mg/dL — ABNORMAL HIGH (ref 65–99)
Glucose-Capillary: 294 mg/dL — ABNORMAL HIGH (ref 65–99)

## 2017-10-03 MED ORDER — HYDRALAZINE HCL 25 MG PO TABS
25.0000 mg | ORAL_TABLET | Freq: Two times a day (BID) | ORAL | Status: DC
  Administered 2017-10-03 (×2): 25 mg via ORAL
  Filled 2017-10-03 (×2): qty 1

## 2017-10-03 MED ORDER — ACETAMINOPHEN 650 MG RE SUPP: 650.0000 mg | Freq: Four times a day (QID) | RECTAL | Status: DC | PRN

## 2017-10-03 MED ORDER — B COMPLEX-C PO TABS
1.0000 | ORAL_TABLET | Freq: Every day | ORAL | Status: DC
Start: 2017-10-03 — End: 2017-10-04
  Administered 2017-10-03: 1 via ORAL
  Filled 2017-10-03 (×3): qty 1

## 2017-10-03 MED ORDER — INSULIN ASPART 100 UNIT/ML ~~LOC~~ SOLN
0.0000 [IU] | Freq: Three times a day (TID) | SUBCUTANEOUS | Status: DC
  Administered 2017-10-03: 3 [IU] via SUBCUTANEOUS
  Administered 2017-10-03: 7 [IU] via SUBCUTANEOUS
  Administered 2017-10-03: 3 [IU] via SUBCUTANEOUS
  Administered 2017-10-04: 5 [IU] via SUBCUTANEOUS
  Filled 2017-10-03 (×4): qty 1

## 2017-10-03 MED ORDER — SODIUM CHLORIDE 0.9 % IV SOLN
INTRAVENOUS | Status: DC
  Administered 2017-10-03: 15:00:00 via INTRAVENOUS

## 2017-10-03 MED ORDER — SUPER B-COMPLEX PO TABS: 1.0000 | ORAL_TABLET | Freq: Every day | ORAL | Status: DC

## 2017-10-03 MED ORDER — INSULIN ASPART 100 UNIT/ML ~~LOC~~ SOLN
0.0000 [IU] | Freq: Every day | SUBCUTANEOUS | Status: DC
  Administered 2017-10-04: 3 [IU] via SUBCUTANEOUS
  Filled 2017-10-03: qty 1

## 2017-10-03 MED ORDER — SODIUM CHLORIDE 0.9 % IV SOLN
Freq: Once | INTRAVENOUS | Status: AC
  Administered 2017-10-03: 02:00:00 via INTRAVENOUS

## 2017-10-03 MED ORDER — ENALAPRIL MALEATE 20 MG PO TABS
20.0000 mg | ORAL_TABLET | Freq: Two times a day (BID) | ORAL | Status: DC
  Administered 2017-10-03 (×2): 20 mg via ORAL
  Filled 2017-10-03 (×5): qty 1

## 2017-10-03 MED ORDER — ADULT MULTIVITAMIN W/MINERALS CH
1.0000 | ORAL_TABLET | Freq: Every day | ORAL | Status: DC
  Administered 2017-10-03: 1 via ORAL
  Filled 2017-10-03: qty 1

## 2017-10-03 MED ORDER — BISACODYL 5 MG PO TBEC: 5.0000 mg | DELAYED_RELEASE_TABLET | Freq: Every day | ORAL | Status: DC | PRN

## 2017-10-03 MED ORDER — ONDANSETRON HCL 4 MG PO TABS: 4.0000 mg | ORAL_TABLET | Freq: Four times a day (QID) | ORAL | Status: DC | PRN

## 2017-10-03 MED ORDER — HYDROCODONE-ACETAMINOPHEN 5-325 MG PO TABS
1.0000 | ORAL_TABLET | ORAL | Status: DC | PRN
  Administered 2017-10-03: 1 via ORAL
  Administered 2017-10-03: 2 via ORAL
  Administered 2017-10-03: 1 via ORAL
  Administered 2017-10-03 – 2017-10-04 (×4): 2 via ORAL
  Filled 2017-10-03 (×4): qty 2
  Filled 2017-10-03: qty 1
  Filled 2017-10-03: qty 2
  Filled 2017-10-03: qty 1

## 2017-10-03 MED ORDER — CARBIDOPA-LEVODOPA ER 50-200 MG PO TBCR
1.0000 | EXTENDED_RELEASE_TABLET | Freq: Every day | ORAL | Status: DC
  Administered 2017-10-03: 1 via ORAL
  Filled 2017-10-03 (×2): qty 1

## 2017-10-03 MED ORDER — ONDANSETRON HCL 4 MG/2ML IJ SOLN: 4.0000 mg | Freq: Four times a day (QID) | INTRAMUSCULAR | Status: DC | PRN

## 2017-10-03 MED ORDER — METOPROLOL SUCCINATE ER 50 MG PO TB24
100.0000 mg | ORAL_TABLET | Freq: Every day | ORAL | Status: DC
  Administered 2017-10-03 – 2017-10-04 (×2): 100 mg via ORAL
  Filled 2017-10-03 (×2): qty 2

## 2017-10-03 MED ORDER — HYDROMORPHONE HCL 1 MG/ML IJ SOLN
INTRAMUSCULAR | Status: AC
  Filled 2017-10-03: qty 1

## 2017-10-03 MED ORDER — CARBIDOPA-LEVODOPA 25-100 MG PO TABS
1.0000 | ORAL_TABLET | Freq: Three times a day (TID) | ORAL | Status: DC
  Administered 2017-10-03 – 2017-10-04 (×4): 1 via ORAL
  Filled 2017-10-03 (×7): qty 1

## 2017-10-03 MED ORDER — HYDROMORPHONE HCL 1 MG/ML IJ SOLN
0.5000 mg | Freq: Once | INTRAMUSCULAR | Status: AC
  Administered 2017-10-03: 0.5 mg via INTRAVENOUS

## 2017-10-03 MED ORDER — ACETAMINOPHEN 325 MG PO TABS: 650.0000 mg | ORAL_TABLET | Freq: Four times a day (QID) | ORAL | Status: DC | PRN

## 2017-10-03 MED ORDER — PANTOPRAZOLE SODIUM 40 MG PO TBEC
40.0000 mg | DELAYED_RELEASE_TABLET | Freq: Every day | ORAL | Status: DC
Start: 2017-10-03 — End: 2017-10-04
  Administered 2017-10-03: 40 mg via ORAL
  Filled 2017-10-03: qty 1

## 2017-10-03 MED ORDER — DOCUSATE SODIUM 100 MG PO CAPS
100.0000 mg | ORAL_CAPSULE | Freq: Two times a day (BID) | ORAL | Status: DC
  Administered 2017-10-03 (×2): 100 mg via ORAL
  Filled 2017-10-03 (×6): qty 1

## 2017-10-03 MED ORDER — FOLIC ACID 1 MG PO TABS
1.0000 mg | ORAL_TABLET | Freq: Every day | ORAL | Status: DC
  Administered 2017-10-03: 1 mg via ORAL
  Filled 2017-10-03: qty 1

## 2017-10-03 MED ORDER — PREMIER PROTEIN SHAKE
11.0000 [oz_av] | Freq: Two times a day (BID) | ORAL | Status: DC
  Administered 2017-10-03: 11 [oz_av] via ORAL

## 2017-10-03 MED ORDER — HEPARIN SODIUM (PORCINE) 5000 UNIT/ML IJ SOLN
5000.0000 [IU] | Freq: Three times a day (TID) | INTRAMUSCULAR | Status: DC
  Administered 2017-10-03 – 2017-10-04 (×4): 5000 [IU] via SUBCUTANEOUS
  Filled 2017-10-03 (×4): qty 1

## 2017-10-03 NOTE — ED Notes (Signed)
Transport to floor room 151.AS 

## 2017-10-03 NOTE — Progress Notes (Signed)
Family Meeting Note  Advance Directive:yes  Today a meeting took place with the Patient. Patient has Parkinson dementia, multiple falls, chronic hyponatremia, now has a humerus fracture.  Discussed the CODE STATUS with him he says he has no healthcare living will and wants to be full code.  Patient sisterin law he is at bedside, she says she will talk to her sister who is patient's wife regarding setting up a DNR.  Time patient is a full code The following clinical team members were present during this meeting:MD  The following were discussed:Patient's diagnosis: , Patient's progosis:  Additional follow-up to be provided: Follow the patient  Time spent during discussion:20 minutes  Epifanio Lesches, MD

## 2017-10-03 NOTE — Consult Note (Signed)
Reason for Consult: L Proximal Humerus Fracture   John Reilly is an 62 y.o. male.  HPI: John Reilly c/o Left shoulder/arm pain s/p a fall at home.  Pt has history of Parkinson's disease.  Pt admitted for low potassium and hypoglycemia. Denies new numbness/tingling/weakness. Denies pain elsewhere or other associated injury.   Past Medical History:  Diagnosis Date  . Cancer ([place])    Pt reports on 10/02/17 that he has never been dx with cancer  . COPD (chronic obstructive pulmonary disease) ([place])   . Diabetes mellitus without complication ([place])   . GERD (gastroesophageal reflux disease)   . Hypertension   . Multiple duodenal ulcers   . Peripheral vascular disease ([facility])    Peripheral Neuropathy  . Retinopathy     Past Surgical History:  Procedure Laterality Date  . ANKLE ARTHROPLASTY     2013  . COLONOSCOPY WITH PROPOFOL N/A 10/12/2016   Procedure: COLONOSCOPY WITH PROPOFOL;  Surgeon: Manya Silvas, MD;  Location: [facility] ENDOSCOPY;  Service: Endoscopy;  Laterality: N/A;    Family History  Problem Relation Age of Onset  . Stroke Mother   . Alzheimer's disease Father   . Diabetes Brother     Social History:  reports that he quit smoking about 11 years ago. He has never used smokeless tobacco. He reports that he drinks about 25.2 oz of alcohol per week. He reports that he does not use drugs.  Allergies:  Allergies  Allergen Reactions  . Penicillins Rash    Reaction:  Unknown; childhood reaction     Medications: I have reviewed the patient's current medications.  Results for orders placed or performed during the hospital encounter of 10/02/17 (from the past 48 hour(s))  Glucose, capillary     Status: None   Collection Time: 10/02/17 10:16 PM  Result Value Ref Range   Glucose-Capillary 90 65 - 99 mg/dL  CBC     Status: Abnormal   Collection Time: 10/02/17 10:24 PM  Result Value Ref Range   WBC 6.7 3.8 - 10.6 K/uL   RBC 2.94 (L) 4.40 - 5.90 MIL/uL   Hemoglobin 10.3 (L)  13.0 - 18.0 g/dL   HCT 29.8 (L) 40.0 - 52.0 %   MCV 101.4 (H) 80.0 - 100.0 fL   MCH 35.2 (H) 26.0 - 34.0 pg   MCHC 34.7 32.0 - 36.0 g/dL   RDW 14.5 11.5 - 14.5 %   Platelets 227 150 - 440 K/uL    Comment: Performed at [facility], [address] [place], [place] [address]  Comprehensive metabolic panel     Status: Abnormal   Collection Time: 10/02/17 10:24 PM  Result Value Ref Range   Sodium 125 (L) 135 - 145 mmol/L   Potassium 2.3 (LL) 3.5 - 5.1 mmol/L    Comment: CRITICAL RESULT CALLED TO, READ BACK BY AND VERIFIED WITH PAULETTE WYATT AT 2305 ON 10/02/17 RWW    Chloride 88 (L) 101 - 111 mmol/L   CO2 25 22 - 32 mmol/L   Glucose, Bld 163 (H) 65 - 99 mg/dL   BUN <5 (L) 6 - 20 mg/dL   Creatinine, Ser 0.49 (L) 0.61 - 1.24 mg/dL   Calcium 6.5 (L) 8.9 - 10.3 mg/dL   Total Protein 5.2 (L) 6.5 - 8.1 g/dL   Albumin 2.5 (L) 3.5 - 5.0 g/dL   AST 20 15 - 41 U/L   ALT <5 (L) 17 - 63 U/L   Alkaline Phosphatase 137 (H) 38 -  126 U/L   Total Bilirubin 0.7 0.3 - 1.2 mg/dL   GFR calc non Af Amer >60 >60 mL/min   GFR calc Af Amer >60 >60 mL/min    Comment: (NOTE) The eGFR has been calculated using the CKD EPI equation. This calculation has not been validated in all clinical situations. eGFR's persistently <60 mL/min signify possible Chronic Kidney Disease.    Anion gap 12 5 - 15    Comment: Performed at [facility], [address] [place], [place] [address]  Troponin I     Status: None   Collection Time: 10/02/17 10:24 PM  Result Value Ref Range   Troponin I <0.03 <0.03 ng/mL    Comment: Performed at [facility], [address] [place], [place] [address]  Glucose, capillary     Status: Abnormal   Collection Time: 10/02/17 11:35 PM  Result Value Ref Range   Glucose-Capillary 167 (H) 65 - 99 mg/dL  Urinalysis, Complete w Microscopic     Status: Abnormal   Collection Time: 10/02/17 11:43 PM  Result Value Ref Range   Color, Urine YELLOW (A) YELLOW    APPearance CLEAR (A) CLEAR   Specific Gravity, Urine 1.005 1.005 - 1.030   pH 6.0 5.0 - 8.0   Glucose, UA >=500 (A) NEGATIVE mg/dL   Hgb urine dipstick NEGATIVE NEGATIVE   Bilirubin Urine NEGATIVE NEGATIVE   Ketones, ur 5 (A) NEGATIVE mg/dL   Protein, ur NEGATIVE NEGATIVE mg/dL   Nitrite NEGATIVE NEGATIVE   Leukocytes, UA NEGATIVE NEGATIVE   RBC / HPF 0-5 0 - 5 RBC/hpf   WBC, UA 0-5 0 - 5 WBC/hpf   Bacteria, UA NONE SEEN NONE SEEN   Squamous Epithelial / LPF 0-5 0 - 5    Comment: Performed at [facility], [address] [place], [place] [address]  Basic metabolic panel     Status: Abnormal   Collection Time: 10/03/17  4:43 AM  Result Value Ref Range   Sodium 125 (L) 135 - 145 mmol/L   Potassium 4.2 3.5 - 5.1 mmol/L   Chloride 90 (L) 101 - 111 mmol/L   CO2 25 22 - 32 mmol/L   Glucose, Bld 355 (H) 65 - 99 mg/dL   BUN 7 6 - 20 mg/dL   Creatinine, Ser 0.59 (L) 0.61 - 1.24 mg/dL   Calcium 6.5 (L) 8.9 - 10.3 mg/dL   GFR calc non Af Amer >60 >60 mL/min   GFR calc Af Amer >60 >60 mL/min    Comment: (NOTE) The eGFR has been calculated using the CKD EPI equation. This calculation has not been validated in all clinical situations. eGFR's persistently <60 mL/min signify possible Chronic Kidney Disease.    Anion gap 10 5 - 15    Comment: Performed at [facility], [address] [place],  [address]  CBC     Status: Abnormal   Collection Time: 10/03/17  4:43 AM  Result Value Ref Range   WBC 7.5 3.8 - 10.6 K/uL   RBC 3.02 (L) 4.40 - 5.90 MIL/uL   Hemoglobin 10.6 (L) 13.0 - 18.0 g/dL   HCT 30.8 (L) 40.0 - 52.0 %   MCV 101.9 (H) 80.0 - 100.0 fL   MCH 35.1 (H) 26.0 - 34.0 pg   MCHC 34.4 32.0 - 36.0 g/dL   RDW 14.3 11.5 - 14.5 %   Platelets 227 150 - 440 K/uL    Comment: Performed at [facility], [address] [place],  [place] [address]  Glucose, capillary     Status: Abnormal   Collection Time: 10/03/17  7:46 AM  Result Value Ref Range    Glucose-Capillary 349 (H) 65 - 99 mg/dL   Comment 1 Notify RN     Dg Chest 1 View  Result Date: 10/02/2017 CLINICAL DATA:  Generalized weakness, recent history of fall EXAM: CHEST  1 VIEW COMPARISON:  03/19/2015 FINDINGS: No focal consolidation or effusion. Scarring at the left lung base. Stable cardiomediastinal silhouette. No pneumothorax. Old left-sided rib fracture. Suspected acute displaced right second rib fracture. Mildly displaced fracture involving the proximal shaft of the left humerus. IMPRESSION: 1. Mild scarring at the left lung base.  No acute opacity 2. Acute mildly displaced fracture proximal shaft of the left humerus. Acute appearing displaced right second rib fracture. Electronically Signed   By: Donavan Foil M.D.   On: 10/02/2017 23:50   Ct Head Wo Contrast  Result Date: 10/03/2017 CLINICAL DATA:  Fall with bruising to the left eye EXAM: CT HEAD WITHOUT CONTRAST TECHNIQUE: Contiguous axial images were obtained from the base of the skull through the vertex without intravenous contrast. COMPARISON:  MRI 12/16/2016 FINDINGS: Brain: No acute territorial infarction, hemorrhage or intracranial mass is visualized. Moderate atrophy. Slight asymmetric enlargement of left anterior convexity extra-axial CSF density. Mild small vessel ischemic changes of the white matter. Nonenlarged ventricles. Vascular: No hyperdense vessels. Scattered calcifications at the carotid siphon Skull: Normal. Negative for fracture or focal lesion. Sinuses/Orbits: No acute finding. Other: Mild left periorbital soft tissue swelling IMPRESSION: 1. No CT evidence for acute intracranial abnormality. 2. Atrophy and mild small vessel ischemic changes of the white matter 3. 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 Electronically Signed   By: Donavan Foil M.D.   On: 10/03/2017 00:04   Dg Shoulder Left  Result Date: 10/02/2017 CLINICAL DATA:   Generalized weakness since this morning. Patient fell yesterday and hit the left shoulder. Limited movement. EXAM: LEFT SHOULDER - 2+ VIEW COMPARISON:  None. FINDINGS: Comminuted fractures of the left humeral head and neck with transverse fracture line across the surgical neck and vertical fracture lines extending to the greater tuberosity. There is impaction of fracture fragments with lateral angulation of the distal humeral fracture fragment. No dislocation at the shoulder joint. Multiple tiny displaced fragments are visualized. Visualized clavicle, scapula, and acromion appear intact. IMPRESSION: Comminuted fractures of the left humeral head and neck with impaction of fracture fragments and lateral angulation of the distal fracture fragment. Electronically Signed   By: Lucienne Capers M.D.   On: 10/02/2017 23:47    ROS  Left shoulder pain. Left arm pain, swelling. All other ROS tested and were negative  Blood pressure 119/63, pulse 98, temperature 98.4 F (36.9 C), temperature source Oral, resp. rate 17, height '5\' 10"'  (1.778 m), weight 72.2 kg (159 lb 2.8 oz), SpO2 100 %.   Physical Exam  Left shoulder with moderate edema diffusely. Compartments of arm are soft. Moderate ecchymosis and mild swelling extending distally into medial arm.  Sling in place. No open wounds. No skin breakdown or tenting. L elbow without crepitance or effusion Full painless PROM of L wrist and fingers . Anterior interosseous, Posterior interosseous, radial, ulnar, median nerves tested without deficit. Sensation intact distally in all dermatoms Left upper extremity.  Hand warm and well perfused with + radial/ulnar pulses.  Assessment/Plan: 1.  Left Proximal humerus fracture -Maintain sling left arm.   -No weight  bearing left upper extremity -ice to left shoulder. Pain control -recommend raise HOB, OOB to chair for pulmonary toilet and shoulder pain -follow up within 1 week as outpatient with ortho  Jaymes Graff 10/03/2017, 11:19 AM

## 2017-10-03 NOTE — ED Notes (Signed)
Sling applied to left arm.

## 2017-10-03 NOTE — Plan of Care (Signed)
  Problem: Education: Goal: Knowledge of General Education information will improve Outcome: Progressing   Problem: Health Behavior/Discharge Planning: Goal: Ability to manage health-related needs will improve Outcome: Progressing   Problem: Clinical Measurements: Goal: Ability to maintain clinical measurements within normal limits will improve Outcome: Progressing Goal: Will remain free from infection Outcome: Progressing Goal: Diagnostic test results will improve Outcome: Progressing Goal: Respiratory complications will improve Outcome: Progressing Goal: Cardiovascular complication will be avoided Outcome: Progressing   Problem: Nutrition: Goal: Adequate nutrition will be maintained Outcome: Progressing   Problem: Coping: Goal: Level of anxiety will decrease Outcome: Progressing   Problem: Elimination: Goal: Will not experience complications related to bowel motility Outcome: Progressing Goal: Will not experience complications related to urinary retention Outcome: Progressing

## 2017-10-03 NOTE — Progress Notes (Signed)
Admitted this morning for fall, found to have left proximal humerus fracture, hyponatremia, hypokalemia,. 1.  Left proximal humerus fracture, continue sling, nonweightbearing for the left upper extremity, seen by orthopedic.  Continue pain medicines.   #2 hyponatremia, patient so told me that he has chronic hyponatremia, sodium 125, continue IV fluids, sodium.  Hold the Lasix. 2.  #3 hypokalemia: Improved after replacement. 4.  Parkinson disease, balance problems, continue Sinemet. Time spent 15 minutes

## 2017-10-03 NOTE — Progress Notes (Signed)
PT Cancellation Note  Patient Details Name: John Reilly MRN: [id number] DOB: July 16, 1955   Cancelled Treatment:    Reason Eval/Treat Not Completed: Other (comment)(Chart reviewed. Awaiting ortho consult for weightbearing status, as well as plan for surgical v conservative management of proximal humeral fracture. WIll follow acutely and eval at later date/time. )   10:59 AM, 10/03/17 Etta Grandchild, PT, DPT Physical Therapist - [facility]  [phone] ([place])    Buccola,[name] 10/03/2017, 10:59 AM

## 2017-10-03 NOTE — Progress Notes (Signed)
Pharmacy Electrolyte Monitoring Consult:  Pharmacy consulted to assist in monitoring and replacing electrolytes in this 62 y.o. male admitted on 10/02/2017 with Weakness and Hypoglycemia   Labs:  Sodium (mmol/L)  Date Value  10/03/2017 125 (L)   Potassium (mmol/L)  Date Value  10/03/2017 4.2   Calcium (mg/dL)  Date Value  10/03/2017 6.5 (L)   Albumin (g/dL)  Date Value  10/02/2017 2.5 (L)    Assessment/Plan: 05/25 2224 K 2.5, Na 125 @ 75 ml/hr, patient received 40 mEq KCI PO and 20 mEq IV x 1.  05/26 0500 K 4.2 WNL no further replacement needed, Na 125 NS @ 75 ml/hr will f/u w/ am labs.  Tobie Lords, PharmD, BCPS Clinical Pharmacist 10/03/2017

## 2017-10-03 NOTE — Progress Notes (Signed)
Initial Nutrition Assessment  DOCUMENTATION CODES:   Non-severe (moderate) malnutrition in context of chronic illness  INTERVENTION:  Provide Premier Protein po BID, each supplement provides 160 kcal and 30 grams of protein.  Encouraged adequate intake of calories and protein at meals. Per patient's dietary recall he is getting an extremely low amount of protein at home. Encouraged him to choose a good source of protein at each meal and discussed which foods contain protein.  NUTRITION DIAGNOSIS:   Moderate Malnutrition related to chronic illness(dementia, DM type 1, COPD) as evidenced by mild fat depletion, mild muscle depletion.  GOAL:   Patient will meet greater than or equal to 90% of their needs  MONITOR:   PO intake, Supplement acceptance, Labs, Weight trends, I & O's  REASON FOR ASSESSMENT:   Malnutrition Screening Tool    ASSESSMENT:   62 year old male with PMHx of Parkinson's disease, dementia, DM type 1, HTN, GERD, COPD, retinopathy, peripheral neuropathy who is admitted after mechanical fall found to have left humeral head fracture, right second rib fracture, hypokalemia, hyponatremia, and generalized weakness/deconditioning.   Met with patient and his wife at bedside. Patient reports he has had a decreased appetite for 1-2 months. His wife reports that the timing may be related in a change of medication related to Parkinson's disease. He does not like to eat well-balanced meals anymore. When his wife asks him what he wants to eat he usually just wants a small apple pie or some mozzarella sticks. He will occasionally have vegetable soup. Today for lunch he reports he had 75% of his roast beef and potatoes and some bites of green beans. Discussed with patient and wife that per his dietary recall he is not obtaining enough protein and calories in his diet. Patient is amenable to drinking ONS to help meet calorie/protein needs. He denies any food allergies or  intolerances.  UBW had originally been 160-180 lbs, but then patient gained up to 200 lbs. Per chart patient was 197.7 lbs on 03/15/2015. Currently weighs 160.2 lbs. Patient has lost 37.5 lbs (19% body weight) at some point over the past 2.5 years. Patient believes the weight loss has occurred fairly recently (within the past year) but he is not sure of exact time frame and there is limited weight history in chart.  Medications reviewed and include: B-complex with vitamin C 1 tablet daily, Colace, folic acid 1 mg daily, Novolog 0-9 units TID, Novolog 0-5 units QHS, MVI daily, pantoprazole, NS @ 50 mL/hr.  Per review of home medications patient typically uses an insulin pump, though noted on triage note that patient reported the pump was not calibrated.  Labs reviewed: CBG 167-349, Sodium 125, Chloride 90, Creatinine 0.59.  NUTRITION - FOCUSED PHYSICAL EXAM:    Most Recent Value  Orbital Region  Mild depletion  Upper Arm Region  Moderate depletion  Thoracic and Lumbar Region  Mild depletion  Buccal Region  Mild depletion  Temple Region  Moderate depletion  Clavicle Bone Region  Mild depletion  Clavicle and Acromion Bone Region  Mild depletion  Scapular Bone Region  Unable to assess  Dorsal Hand  Mild depletion  Patellar Region  No depletion  Anterior Thigh Region  No depletion  Posterior Calf Region  Mild depletion  Edema (RD Assessment)  None  Hair  Reviewed  Eyes  Reviewed  Mouth  Reviewed  Skin  Reviewed  Nails  Reviewed     Diet Order:   Diet Order  Diet heart healthy/carb modified Room service appropriate? Yes; Fluid consistency: Thin  Diet effective now          EDUCATION NEEDS:   Education needs have been addressed  Skin:  Skin Assessment: Reviewed RN Assessment  Last BM:  10/02/2017  Height:   Ht Readings from Last 1 Encounters:  10/02/17 '5\' 10"'  (1.778 m)    Weight:   Wt Readings from Last 1 Encounters:  10/03/17 159 lb 2.8 oz (72.2 kg)     Ideal Body Weight:  75.5 kg  BMI:  Body mass index is 22.84 kg/m.  Estimated Nutritional Needs:   Kcal:  6587-1841 (MSJ x 1.2-1.4)  Protein:  85-100 grams (1.2-1.4 grams/kg)  Fluid:  1.8-2.1 L/day (1 mL/kcal)  Willey Blade, MS, RD, LDN Office: [phone] Pager: [phone] After Hours/Weekend Pager: [phone]

## 2017-10-03 NOTE — H&P (Signed)
[place] [facility] [place] NAME: John Reilly    MR#:  [id number]  DATE OF BIRTH:  27-Feb-1956  DATE OF ADMISSION:  10/02/2017  PRIMARY CARE PHYSICIAN: Glendon Axe, MD   REQUESTING/REFERRING PHYSICIAN:   CHIEF COMPLAINT:   Chief Complaint  Patient presents with  . Weakness  . Hypoglycemia    HISTORY OF PRESENT ILLNESS: John Reilly  is a 62 y.o. male with a known history of type 1 diabetes, Parkinson's disease and dementia. Patient presented to emergency room for generalized weakness and left shoulder pain status post mechanical fall yesterday.  Patient struck his left head, face and his left shoulder.  Family tried to bring him in yesterday but patient refused.  He remained bedridden due to generalized weakness and pain.  His wife called the paramedics for evaluation. Blood test done in the emergency room reveals low blood sugar at 35, low potassium level at 2.3 and hyponatremia at 125.  Brain CT, reviewed by myself, is negative for acute changes.  UA is negative for UTI.  Left upper extremity x-ray is positive for humeral head fracture.  Chest x-ray shows acute displaced right second rib fracture. She was admitted for further evaluation and treatment  PAST MEDICAL HISTORY:   Past Medical History:  Diagnosis Date  . Cancer ([place])    Pt reports on 10/02/17 that he has never been dx with cancer  . COPD (chronic obstructive pulmonary disease) ([place])   . Diabetes mellitus without complication ([place])   . GERD (gastroesophageal reflux disease)   . Hypertension   . Multiple duodenal ulcers   . Peripheral vascular disease ([facility])    Peripheral Neuropathy  . Retinopathy     PAST SURGICAL HISTORY:  Past Surgical History:  Procedure Laterality Date  . ANKLE ARTHROPLASTY     2013  . COLONOSCOPY WITH PROPOFOL N/A 10/12/2016   Procedure: COLONOSCOPY WITH PROPOFOL;  Surgeon: Manya Silvas, MD;  Location: [facility] ENDOSCOPY;  Service: Endoscopy;   Laterality: N/A;    SOCIAL HISTORY:  Social History   Tobacco Use  . Smoking status: Former Smoker    Last attempt to quit: 10/21/2005    Years since quitting: 11.9  . Smokeless tobacco: Never Used  Substance Use Topics  . Alcohol use: Yes    Alcohol/week: 25.2 oz    Types: 42 Cans of beer per week    Comment: patient states he drinks 1 quart of beer per day    FAMILY HISTORY:  Family History  Problem Relation Age of Onset  . Stroke Mother   . Alzheimer's disease Father   . Diabetes Brother     DRUG ALLERGIES:  Allergies  Allergen Reactions  . Penicillins Rash    Reaction:  Unknown; childhood reaction     REVIEW OF SYSTEMS:   CONSTITUTIONAL: No fever, the patient complains of fatigue and generalized weakness.  EYES: No blurred or double vision.  EARS, NOSE, AND THROAT: No tinnitus or ear pain.  RESPIRATORY: No cough, shortness of breath, wheezing or hemoptysis.  CARDIOVASCULAR: No chest pain, orthopnea, edema.  GASTROINTESTINAL: No nausea, vomiting, diarrhea or abdominal pain.  GENITOURINARY: No dysuria, hematuria.  ENDOCRINE: No polyuria, nocturia,  HEMATOLOGY: No anemia, easy bruising or bleeding SKIN: No rash or lesion. MUSCULOSKELETAL: Severe left shoulder pain, worse with range of motion.   NEUROLOGIC: No tingling, numbness, weakness.  PSYCHIATRY: No anxiety or depression.   MEDICATIONS AT HOME:  Prior to Admission medications   Medication Sig  Start Date End Date Taking? Authorizing Provider  B Complex-Biotin-FA (SUPER B-COMPLEX) TABS Take 1 tablet by mouth daily.   Yes [provider]  carbidopa-levodopa (SINEMET CR) 50-200 MG tablet Take 1 tablet by mouth at bedtime. 07/22/17  Yes [provider]  carbidopa-levodopa (SINEMET IR) 25-100 MG tablet Take 1 tablet by mouth 3 (three) times daily. 04/22/17 10/19/17 Yes [provider]  enalapril (VASOTEC) 20 MG tablet Take 20 mg by mouth 2 (two) times daily.    Yes [provider]  folic acid (FOLVITE) 1 MG tablet Take 1 mg by mouth daily.   Yes [provider]  furosemide (LASIX) 20 MG tablet Take 1 tablet (20 mg total) by mouth daily. 03/21/15  Yes Dustin Flock, MD  glucagon 1 MG injection 1 mg by Other route once as needed (for severe hypoglycemia).    Yes [provider]  Insulin Human (INSULIN PUMP) SOLN Pt uses Humalog.   Yes [provider]  metoprolol succinate (TOPROL-XL) 100 MG 24 hr tablet Take 100 mg by mouth daily.    Yes [provider]  Multiple Vitamin (MULTIVITAMIN WITH MINERALS) TABS tablet Take 1 tablet by mouth daily.   Yes [provider]  omeprazole (PRILOSEC) 40 MG capsule Take 40 mg by mouth daily.   Yes [provider]  hydrALAZINE (APRESOLINE) 25 MG tablet Take 25 mg by mouth 2 (two) times daily.    [provider]      PHYSICAL EXAMINATION:   VITAL SIGNS: Blood pressure (!) 113/52, pulse (!) 108, temperature 98.1 F (36.7 C), temperature source Oral, resp. rate 17, height 5\' 10"  (1.778 m), weight 72.2 kg (159 lb 2.8 oz), SpO2 92 %.  GENERAL:  62 y.o.-year-old patient lying in the bed with no acute distress.  EYES: Pupils equal, round, reactive to light and accommodation. No scleral icterus. Extraocular muscles intact.  HEENT: Head atraumatic, normocephalic. Oropharynx and nasopharynx clear.  NECK:  Supple, no jugular venous distention. No thyroid enlargement, no tenderness.  LUNGS: Normal breath sounds bilaterally, no wheezing, rales,rhonchi or crepitation. No use of accessory muscles of respiration.  CARDIOVASCULAR: S1, S2 normal. No S3/S4.  ABDOMEN: Soft, nontender, nondistended. Bowel sounds present. No organomegaly or mass.  EXTREMITIES: No pedal edema, cyanosis, or clubbing.  NEUROLOGIC: No focal weakness appreciated PSYCHIATRIC: The patient is alert and oriented x 3.  SKIN: No obvious rash, lesion, or ulcer.  MUSCULOSKELETAL: There is severe tenderness with range  of motion at left shoulder joint; range of motion is severely reduced due to pain.  LABORATORY PANEL:   CBC Recent Labs  Lab 10/02/17 2224  WBC 6.7  HGB 10.3*  HCT 29.8*  PLT 227  MCV 101.4*  MCH 35.2*  MCHC 34.7  RDW 14.5   ------------------------------------------------------------------------------------------------------------------  Chemistries  Recent Labs  Lab 10/02/17 2224  NA 125*  K 2.3*  CL 88*  CO2 25  GLUCOSE 163*  BUN <5*  CREATININE 0.49*  CALCIUM 6.5*  AST 20  ALT <5*  ALKPHOS 137*  BILITOT 0.7   ------------------------------------------------------------------------------------------------------------------ estimated creatinine clearance is 97.8 mL/min (A) (by C-G formula based on SCr of 0.49 mg/dL (L)). ------------------------------------------------------------------------------------------------------------------ No results for input(s): TSH, T4TOTAL, T3FREE, THYROIDAB in the last 72 hours.  Invalid input(s): FREET3   Coagulation profile No results for input(s): INR, PROTIME in the last 168 hours. ------------------------------------------------------------------------------------------------------------------- No results for input(s): DDIMER in the last 72 hours. -------------------------------------------------------------------------------------------------------------------  Cardiac Enzymes Recent Labs  Lab 10/02/17 2224  TROPONINI <0.03   ------------------------------------------------------------------------------------------------------------------  Invalid input(s): POCBNP  ---------------------------------------------------------------------------------------------------------------  Urinalysis    Component Value Date/Time   COLORURINE YELLOW (A) 10/02/2017 2343   APPEARANCEUR CLEAR (A) 10/02/2017 2343   LABSPEC 1.005 10/02/2017 2343   PHURINE 6.0 10/02/2017 2343   GLUCOSEU >=500 (A) 10/02/2017 2343   HGBUR NEGATIVE  10/02/2017 2343   BILIRUBINUR NEGATIVE 10/02/2017 2343   KETONESUR 5 (A) 10/02/2017 2343   PROTEINUR NEGATIVE 10/02/2017 2343   NITRITE NEGATIVE 10/02/2017 2343   LEUKOCYTESUR NEGATIVE 10/02/2017 2343     RADIOLOGY: Dg Chest 1 View  Result Date: 10/02/2017 CLINICAL DATA:  Generalized weakness, recent history of fall EXAM: CHEST  1 VIEW COMPARISON:  03/19/2015 FINDINGS: No focal consolidation or effusion. Scarring at the left lung base. Stable cardiomediastinal silhouette. No pneumothorax. Old left-sided rib fracture. Suspected acute displaced right second rib fracture. Mildly displaced fracture involving the proximal shaft of the left humerus. IMPRESSION: 1. Mild scarring at the left lung base.  No acute opacity 2. Acute mildly displaced fracture proximal shaft of the left humerus. Acute appearing displaced right second rib fracture. Electronically Signed   By: Donavan Foil M.D.   On: 10/02/2017 23:50   Ct Head Wo Contrast  Result Date: 10/03/2017 CLINICAL DATA:  Fall with bruising to the left eye EXAM: CT HEAD WITHOUT CONTRAST TECHNIQUE: Contiguous axial images were obtained from the base of the skull through the vertex without intravenous contrast. COMPARISON:  MRI 12/16/2016 FINDINGS: Brain: No acute territorial infarction, hemorrhage or intracranial mass is visualized. Moderate atrophy. Slight asymmetric enlargement of left anterior convexity extra-axial CSF density. Mild small vessel ischemic changes of the white matter. Nonenlarged ventricles. Vascular: No hyperdense vessels. Scattered calcifications at the carotid siphon Skull: Normal. Negative for fracture or focal lesion. Sinuses/Orbits: No acute finding. Other: Mild left periorbital soft tissue swelling IMPRESSION: 1. No CT evidence for acute intracranial abnormality. 2. Atrophy and mild small vessel ischemic changes of the white matter 3. 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 Electronically Signed   By: Donavan Foil M.D.   On: 10/03/2017 00:04   Dg Shoulder Left  Result Date: 10/02/2017 CLINICAL DATA:  Generalized weakness since this morning. Patient fell yesterday and hit the left shoulder. Limited movement. EXAM: LEFT SHOULDER - 2+ VIEW COMPARISON:  None. FINDINGS: Comminuted fractures of the left humeral head and neck with transverse fracture line across the surgical neck and vertical fracture lines extending to the greater tuberosity. There is impaction of fracture fragments with lateral angulation of the distal humeral fracture fragment. No dislocation at the shoulder joint. Multiple tiny displaced fragments are visualized. Visualized clavicle, scapula, and acromion appear intact. IMPRESSION: Comminuted fractures of the left humeral head and neck with impaction of fracture fragments and lateral angulation of the distal fracture fragment. Electronically Signed   By: Lucienne Capers M.D.   On: 10/02/2017 23:47    EKG: Orders placed or performed during the hospital encounter of 10/02/17  . ED EKG  . ED EKG  . ED EKG  . ED EKG  . EKG 12-Lead  . EKG 12-Lead  . EKG 12-Lead  . EKG 12-Lead    IMPRESSION AND PLAN:  1.  Left humeral head fracture.  Will place left upper extremity in a sling.  Continue pain control.  Ortho is consulted for further evaluation and treatment. 2.  Hypokalemia, will replace potassium per protocol.  Will hold Lasix for now. 3.  Hyponatremia.  Sodium level is 125.  Clinically,  patient looks with hypovolemia.  We will start gentle IV hydration and monitor sodium level closely. 4.  Advanced Parkinson's disease, continue treatment with carbidopa levodopa. 5.  Right second rib fracture, status post mechanical fall.  Continue pain control. 6.  Hypertension, stable, restart home medications. 7.  Generalized weakness and deconditioning.  We will consult PT/OT for further evaluation and treatment.  All the records are  reviewed and case discussed with ED provider. Management plans discussed with the patient and he is in agreement.  CODE STATUS:FULL    Code Status Orders  (From admission, onward)        Start     Ordered   10/03/17 0209  Full code  Continuous     10/03/17 0209    Code Status History    Date Active Date Inactive Code Status Order ID Comments User Context   03/19/2015 1939 03/21/2015 1928 Full Code [id number]  Bettey Costa, MD Inpatient   03/15/2015 2114 03/16/2015 1837 Full Code [id number]  Hower, Aaron Mose, MD ED       TOTAL TIME TAKING CARE OF THIS PATIENT: 45 minutes.    Amelia Jo M.D on 10/03/2017 at 4:32 AM  Between 7am to 6pm - Pager - [phone]  After 6pm go to [email or web address] - password [email or web address] [facility]  [facility]  Office  [phone]  CC: Primary care physician; Glendon Axe, MD

## 2017-10-03 NOTE — Evaluation (Signed)
Physical Therapy Evaluation Patient Details Name: John Reilly MRN: [id number] DOB: [birth year] Today's Date: 10/03/2017   History of Present Illness  John Reilly is a 62yo white male who comes to [facility] from home after a fall home, impact to face, head, shoulder, imaging revealing of Left proximal humeral displaced/comminuted fracture, Left 2nd rib fracutre. Orthopedic consult asking NWB LUE, sling, and conservative management at this time. PMH: Parkinson's disease (diagnosed 6-9 MA), dementia, COPD, PVD, HTN, DM. In ED BG: 35, Na: 125 (pt reports as chronic), K:2.3. Pt reports estimated 4 falls in past 6 months which he attributes to parkinsonism. PMH: also includes severe Rt ankle fracture 5ya which has since limited mobility. Pt uses a QC at baseline, mostly AMB in the home.   Clinical Impression  Pt admitted with above diagnosis. Pt currently with functional limitations due to the deficits listed below (see "PT Problem List"). Upon entry, the patient is received semirecumbent in bed, wife present.The pt is awake and agreeable to participate. Pt c/o severe pain with basic weight shifting in bed, anxious about functional mobility. The pt is alert and oriented x3, pleasant, conversational, and following simple commands consistently, provides detailed history. Physical assistance required for bed mobility and transfers, supervision for steps to recliner, mostly for pain management and safety, whereas the patient performed these at a higher level of independence PTA. Pt has a frequent falls history PTA and will need supervision for all mobility OOB upon DC. Pt asks to defer AMB at this time, but is agreeable to sitting up in chair for >60 minutes to decrease risk of blood clot, PNA, and further deconditioning. Pt will benefit from skilled PT intervention to increase independence and safety with basic mobility in preparation for discharge to the venue listed below.       Follow Up Recommendations Home health  PT;Supervision for mobility/OOB    Equipment Recommendations  None recommended by PT    Recommendations for Other Services       Precautions / Restrictions Precautions Precautions: Shoulder Shoulder Interventions: Shoulder sling/immobilizer;At all times Required Braces or Orthoses: Sling Restrictions LUE Weight Bearing: Non weight bearing      Mobility  Bed Mobility Overal bed mobility: Needs Assistance Bed Mobility: Supine to Sit     Supine to sit: Mod assist     General bed mobility comments: RUE pulling self to EOB, soem trunk support offered.   Transfers Overall transfer level: Needs assistance Equipment used: None Transfers: Sit to/from Stand           General transfer comment: RUE pulling self to standing  Ambulation/Gait Ambulation/Gait assistance: (pt defers to tomorrow,; 10/10 pain with all movement at this time.)              Stairs            Wheelchair Mobility    Modified Rankin (Stroke Patients Only)       Balance Overall balance assessment: History of Falls;Needs assistance Sitting-balance support: Single extremity supported Sitting balance-[name]: Normal     Standing balance support: Single extremity supported;During functional activity Standing balance-[name]: Fair                               Pertinent Vitals/Pain Pain Assessment: 0-10 Pain Score: 10-Worst pain ever(well controlled at rest, but spikes to 10/10 with any gross movement. ) Pain Location: Left shoulder;  Pain Intervention(s): Limited activity within patient's tolerance;Premedicated before session;Repositioned  Home Living Family/patient expects to be discharged to:: Private residence Living Arrangements: Spouse/significant other Available Help at Discharge: Family;Other (Comment)(SIL, grand children; wife works during day) Type of Home: House Home Access: Stairs to enter   [employer] of Steps: 2 in front with  rails; 1 in garage with no rails Home Layout: One level Home Equipment: Bedside commode;Cane - quad Additional Comments: mostly household AMB,but will use QC out of house as well.     Prior Function Level of Independence: Independent with assistive device(s)         Comments: additional time with self care, sits on 3-in-1 for bathing in shower;      Hand Dominance   Dominant Hand: Right    Extremity/Trunk Assessment   Upper Extremity Assessment Upper Extremity Assessment: Defer to OT evaluation    Lower Extremity Assessment Lower Extremity Assessment: Overall WFL for tasks assessed    Cervical / Trunk Assessment Cervical / Trunk Assessment: Normal  Communication      Cognition Arousal/Alertness: Awake/alert Behavior During Therapy: WFL for tasks assessed/performed;Anxious Overall Cognitive Status: Within Functional Limits for tasks assessed                                 General Comments: in a lot of pain, which is contributing to anxiety      General Comments      Exercises     Assessment/Plan    PT Assessment Patient needs continued PT services  PT Problem List Decreased activity tolerance;Decreased mobility;Decreased balance       PT Treatment Interventions Gait training;Stair training;Functional mobility training;Therapeutic activities;Therapeutic exercise;Patient/family education;Balance training    PT Goals (Current goals can be found in the Care Plan section)  Acute Rehab PT Goals Patient Stated Goal: resolove pain to allow movement PT Goal Formulation: With patient Time For Goal Achievement: 10/17/17 Potential to Achieve Goals: Fair    Frequency 7X/week   Barriers to discharge Decreased caregiver support pt will be home alone during day.     Co-evaluation               AM-PAC PT "6 Clicks" Daily Activity  Outcome Measure Difficulty turning over in bed (including adjusting bedclothes, sheets and blankets)?:  Unable Difficulty moving from lying on back to sitting on the side of the bed? : Unable Difficulty sitting down on and standing up from a chair with arms (e.g., wheelchair, bedside commode, etc,.)?: Unable Help needed moving to and from a bed to chair (including a wheelchair)?: A Lot Help needed walking in hospital room?: A Lot Help needed climbing 3-5 steps with a railing? : A Lot 6 Click Score: 9    End of Session Equipment Utilized During Treatment: (sling) Activity Tolerance: Patient limited by pain Patient left: in chair;with family/visitor present;with call bell/phone within reach   PT Visit Diagnosis: Unsteadiness on feet (R26.81);Repeated falls (R29.6);Difficulty in walking, not elsewhere classified (R26.2);Pain Pain - Right/Left: Left Pain - part of body: Shoulder    Time: 8841-6606 PT Time Calculation (min) (ACUTE ONLY): 32 min   Charges:   PT Evaluation $PT Eval High Complexity: 1 High PT Treatments $Therapeutic Activity: 8-22 mins   PT G Codes:        1:43 PM, 2017-10-25 Etta Grandchild, PT, DPT Physical Therapist - [facility]  [phone] ([place])    , C 25-Oct-2017, 1:40 PM

## 2017-10-04 DIAGNOSIS — E440 Moderate protein-calorie malnutrition: Secondary | ICD-10-CM

## 2017-10-04 LAB — BASIC METABOLIC PANEL
Anion gap: 10 (ref 5–15)
Anion gap: 17 — ABNORMAL HIGH (ref 5–15)
BUN: 11 mg/dL (ref 6–20)
BUN: 10 mg/dL (ref 6–20)
CO2: 23 mmol/L (ref 22–32)
CO2: 18 mmol/L — ABNORMAL LOW (ref 22–32)
Calcium: 7.2 mg/dL — ABNORMAL LOW (ref 8.9–10.3)
Calcium: 7.5 mg/dL — ABNORMAL LOW (ref 8.9–10.3)
Chloride: 96 mmol/L — ABNORMAL LOW (ref 101–111)
Chloride: 96 mmol/L — ABNORMAL LOW (ref 101–111)
Creatinine, Ser: 0.62 mg/dL (ref 0.61–1.24)
Creatinine, Ser: 0.8 mg/dL (ref 0.61–1.24)
GFR calc Af Amer: >60 mL/min (ref >60)
GFR calc Af Amer: >60 mL/min (ref >60)
GFR calc non Af Amer: >60 mL/min (ref >60)
GFR calc non Af Amer: >60 mL/min (ref >60)
Glucose, Bld: 276 mg/dL — ABNORMAL HIGH (ref 65–99)
Glucose, Bld: 340 mg/dL — ABNORMAL HIGH (ref 65–99)
Potassium: 3.7 mmol/L (ref 3.5–5.1)
Potassium: 3.9 mmol/L (ref 3.5–5.1)
Sodium: 129 mmol/L — ABNORMAL LOW (ref 135–145)
Sodium: 131 mmol/L — ABNORMAL LOW (ref 135–145)

## 2017-10-04 LAB — HIV ANTIBODY (ROUTINE TESTING W REFLEX): HIV Screen 4th Generation wRfx: NONREACTIVE

## 2017-10-04 LAB — GLUCOSE, CAPILLARY: GLUCOSE-CAPILLARY: 275 mg/dL — AB (ref 65–99)

## 2017-10-04 MED ORDER — ACETAMINOPHEN 325 MG PO TABS: 650.0000 mg | ORAL_TABLET | ORAL | 0 refills | Status: DC | PRN

## 2017-10-04 MED ORDER — HYDROCODONE-ACETAMINOPHEN 7.5-325 MG PO TABS: 1.0000 | ORAL_TABLET | Freq: Four times a day (QID) | ORAL | 0 refills | Status: AC | PRN

## 2017-10-04 NOTE — Progress Notes (Signed)
Pharmacy Electrolyte Monitoring Consult:  Pharmacy consulted to assist in monitoring and replacing electrolytes in this 63 y.o. male admitted on 10/02/2017 with Weakness and Hypoglycemia   Labs:  Sodium (mmol/L)  Date Value  10/04/2017 129 (L)   Potassium (mmol/L)  Date Value  10/04/2017 3.7   Calcium (mg/dL)  Date Value  10/04/2017 7.2 (L)   Albumin (g/dL)  Date Value  10/02/2017 2.5 (L)    Assessment/Plan: 05/25 2224 K 2.5, Na 125 @ 75 ml/hr, patient received 40 mEq KCI PO and 20 mEq IV x 1.  05/26 0500 K 4.2 WNL no further replacement needed, Na 125 NS @ 75 ml/hr will f/u w/ am labs.  5/27 AM labs K 3.7 no supplementation needed. Will check mag in AM as K was low on admission.   Rayna Sexton, PharmD, BCPS Clinical Pharmacist 10/04/2017 7:31 AM

## 2017-10-04 NOTE — Progress Notes (Signed)
Physical Therapy Treatment Patient Details Name: John Reilly MRN: [id number] DOB: 08-Feb-1956 Today's Date: 10/04/2017    History of Present Illness John Reilly is a 62yo white male who comes to [facility] from home after a fall home, impact to face, head, shoulder, imaging revealing of Left proximal humeral displaced/comminuted fracture, Left 2nd rib fracutre. Orthopedic consult asking NWB LUE, sling, and conservative management at this time. PMH: Parkinson's disease (diagnosed 6-9 MA), dementia, COPD, PVD, HTN, DM. In ED BG: 35, Na: 125 (pt reports as chronic), K:2.3. Pt reports estimated 4 falls in past 6 months which he attributes to parkinsonism. PMH: also includes severe Rt ankle fracture 5ya which has since limited mobility. Pt uses a QC at baseline, mostly AMB in the home.     PT Comments    Session attempted at 8:30 this am.  Pt resistant and refusing session due to fear of falling and not being dressed.  Pt encouraged but he continued to refuse.  Primary nurse in to re-enforce and pt agreed to after breakfast.    Returned at 9:05 and he again was fearful but agreed with encouragement.  Pt was able to stand and ambulate to/from rehab gym for stair training with QC and min a +1.  Nurse tech accompanied session for pt comfort but did not directly assist.  Pt often carried QC instead of using it for gait.  Education provided but he continued to not use can effectively.  He was able to complete stair training with 1 rail right.  Reports he has 4 steps onto porch and once threshold step into house.  Pt somewhat hesitant on stairs and education/encouragement provided.  Discussed discharge plan.  Wife to assist pt home.  Discussed with RN who will talk with wife about +1 assist at all times for safety/balance.  Encouraged pt to also have another family member or friend in attendance to get into home as fear may impact overall safety getting into his home.  He voiced understanding.  Stated he plans on  using a wheelchair to navigate his home if needed.  He plans to rely on transfers which he has demonstrated improved safety and comfort with if he is fearful or has difficulty walking due to pain/balance.  He has a wheelchair at home.  Discussed general safety and ways to improve overall functional mobility in the home.  Pt remains at an increased fall risk due to safety and balance but stated he wants to return home with wife.     Follow Up Recommendations  Home health PT;Supervision for mobility/OOB     Equipment Recommendations  None recommended by PT    Recommendations for Other Services       Precautions / Restrictions Precautions Precautions: Shoulder Shoulder Interventions: Shoulder sling/immobilizer;At all times Required Braces or Orthoses: Sling Restrictions Weight Bearing Restrictions: Yes LUE Weight Bearing: Non weight bearing    Mobility  Bed Mobility Overal bed mobility: Independent Bed Mobility: Supine to Sit     Supine to sit: Min assist     General bed mobility comments: in recliner  Transfers Overall transfer level: Needs assistance Equipment used: 1 person hand held assist Transfers: Sit to/from Stand Sit to Stand: Min assist         General transfer comment: HHA used from RUE, pt with increased anxiety, assist needed to push self up with one hand from NWB LUE  Ambulation/Gait     Assistive device: Quad cane Gait Pattern/deviations: Step-through pattern;Staggering right;Staggering left Gait velocity: verbal  cues to slow down   General Gait Details: generally unsteady and requires +1 assist for safety.   Stairs Stairs: Yes Stairs assistance: Min assist Stair Management: One rail Right;Step to pattern Number of Stairs: 4 General stair comments: mod verbal cues for encouragement and technique   Wheelchair Mobility    Modified Rankin (Stroke Patients Only)       Balance Overall balance assessment: Needs assistance;History of  Falls Sitting-balance support: Single extremity supported Sitting balance-[name]: Fair Sitting balance - Comments: one incidence of LOB in trunk extension, corrected self after increase time and min A from OT   Standing balance support: Single extremity supported;During functional activity Standing balance-[name]: Poor Standing balance comment: generally unsteady with poor use of QC during gait.  Needs frequent vc's to use cane and not "carry" it.                            Cognition Arousal/Alertness: Awake/alert Behavior During Therapy: Agitated Overall Cognitive Status: Within Functional Limits for tasks assessed                                 General Comments: pt began to get slightly agitated/ increased anxiety but easily redirected       Exercises      General Comments        Pertinent Vitals/Pain Pain Assessment: 0-10 Pain Score: 7  Pain Location: Left shoulder;  Pain Descriptors / Indicators: Aching Pain Intervention(s): Limited activity within patient's tolerance    Home Living Family/patient expects to be discharged to:: Private residence Living Arrangements: Spouse/significant other Available Help at Discharge: Family;Other (Comment)(grandchildren and wife) Type of Home: House Home Access: Stairs to enter   Home Layout: One level Home Equipment: Bedside commode;Cane - quad;Other (comment) Additional Comments: uses BSC as shower bench    Prior Function Level of Independence: Independent with assistive device(s)      Comments: increased time to complete self care   PT Goals (current goals can now be found in the care plan section) Acute Rehab PT Goals Patient Stated Goal: to get back doing everything I used to do for myself Progress towards PT goals: Progressing toward goals    Frequency    7X/week      PT Plan Current plan remains appropriate    Co-evaluation              AM-PAC PT "6 Clicks" Daily  Activity  Outcome Measure  Difficulty turning over in bed (including adjusting bedclothes, sheets and blankets)?: Unable Difficulty moving from lying on back to sitting on the side of the bed? : Unable Difficulty sitting down on and standing up from a chair with arms (e.g., wheelchair, bedside commode, etc,.)?: Unable Help needed moving to and from a bed to chair (including a wheelchair)?: A Little Help needed walking in hospital room?: A Lot Help needed climbing 3-5 steps with a railing? : A Lot 6 Click Score: 10    End of Session Equipment Utilized During Treatment: Gait belt Activity Tolerance: Patient tolerated treatment well Patient left: in chair;with chair alarm set;with call bell/phone within reach Nurse Communication: Mobility status Pain - Right/Left: Left Pain - part of body: Shoulder     Time: 6440-3474 PT Time Calculation (min) (ACUTE ONLY): 18 min  Charges:  $Gait Training: 8-22 mins  G Codes:       Chesley Noon, PTA 10/04/17, 10:15 AM

## 2017-10-04 NOTE — Discharge Instructions (Signed)
Humerus Fracture Treated With Immobilization The humerus is the large bone in the upper arm. A broken (fractured) humerus is often treated by wearing a cast, splint, or sling (immobilization). This holds the broken pieces in place so they can heal. Follow these instructions at home: If you have a cast:  Do not stick anything inside the cast to scratch your skin.  Check the skin around the cast every day. Tell your doctor about any concerns. You may put lotion on dry skin around the edges of the cast. Do not put lotion on the skin underneath the cast.  Keep the cast clean and dry as told by your doctor. If you have a splint:  Wear it as told by your doctor. Remove it only as told by your doctor.  Loosen the splint if your fingers become numb and tingle, or if they turn cold and blue.  Keep the splint clean and dry. If you have a sling:  Wear it as told by your doctor. Remove it only as told by your doctor. Bathing  Do not take baths, swim, or use a hot tub until your doctor says that you can. Ask your doctor if you can take showers. You may only be allowed to take sponge baths.  If your cast or splint is not waterproof, cover it with a watertight plastic bag while you take a bath or a shower. Do not let the cast or splint get wet.  If you have a sling, remove it for bathing only if your doctor says this is okay. Managing pain, stiffness, and swelling  If directed, put ice on the injured area. ? Put ice in a plastic bag. ? Place a towel between your skin and the bag. ? Leave the ice on for 20 minutes, 2-3 times per day.  Move your fingers often to avoid stiffness and to lessen swelling.  Raise (elevate) the injured area above the level of your heart while you are sitting or lying down. Driving  Do not drive or use heavy machinery while taking prescription pain medicine.  Do not drive while wearing a cast, splint, or sling on an arm that you use for driving. Activity  Return  to your normal activities as told by your doctor. Ask your doctor what activities are safe for you.  Do range-of-motion exercises only as told by your doctor. General instructions  Do not put pressure on any part of the cast or splint until it is fully hardened. This may take many hours.  Do not use any tobacco products. These include cigarettes, chewing tobacco, or e-cigarettes. Tobacco can delay bone healing. If you need help quitting, ask your doctor.  Take over-the-counter and prescription medicines only as told by your doctor.  Keep all follow-up visits as told by your doctor. This is important. Contact a doctor if:  You have any new pain, swelling, or bruising.  Your pain, swelling, and bruising do not get better.  Your cast, splint, or sling becomes loose or damaged. Get help right away if:  Your skin or fingers on your injured arm turn blue or gray.  Your arm is cold or numb.  You have very bad pain in your injured arm. This information is not intended to replace advice given to you by your health care provider. Make sure you discuss any questions you have with your health care provider. Document Released: 10/14/2007 Document Revised: 10/03/2015 Document Reviewed: 09/19/2014 Elsevier Interactive Patient Education  [employer].  Cast or Splint Care, Adult Casts and splints are supports that are worn to protect broken bones and other injuries. A cast or splint may hold a bone still and in the correct position while it heals. Casts and splints may also help to ease pain, swelling, and muscle spasms. How to care for your cast  Do not stick anything inside the cast to scratch your skin.  Check the skin around the cast every day. Tell your doctor about any concerns.  You may put lotion on dry skin around the edges of the cast. Do not put lotion on the skin under the cast.  Keep the cast clean.  If the cast is not waterproof: ? Do not let it get wet. ? Cover  it with a watertight covering when you take a bath or a shower. How to care for your splint  Wear it as told by your doctor. Take it off only as told by your doctor.  Loosen the splint if your fingers or toes tingle, get numb, or turn cold and blue.  Keep the splint clean.  If the splint is not waterproof: ? Do not let it get wet. ? Cover it with a watertight covering when you take a bath or a shower. Follow these instructions at home: Bathing  Do not take baths or swim until your doctor says it is okay. Ask your doctor if you can take showers. You may only be allowed to take sponge baths for bathing.  If your cast or splint is not waterproof, cover it with a watertight covering when you take a bath or shower. Managing pain, stiffness, and swelling  Move your fingers or toes often to avoid stiffness and to lessen swelling.  Raise (elevate) the injured area above the level of your heart while sitting or lying down. Safety  Do not use the injured limb to support your body weight until your doctor says that it is okay.  Use crutches or other assistive devices as told by your doctor. General instructions  Do not put pressure on any part of the cast or splint until it is fully hardened. This may take many hours.  Return to your normal activities as told by your doctor. Ask your doctor what activities are safe for you.  Keep all follow-up visits as told by your doctor. This is important. Contact a doctor if:  Your cast or splint gets damaged.  The skin around the cast gets red or raw.  The skin under the cast is very itchy or painful.  Your cast or splint feels very uncomfortable.  Your cast or splint is too tight or too loose.  Your cast becomes wet or it starts to have a soft spot or area.  You get an object stuck under your cast. Get help right away if:  Your pain gets worse.  The injured area tingles, gets numb, or turns blue and cold.  The part of your body  above or below the cast is swollen and it turns a different color (is discolored).  You cannot feel or move your fingers or toes.  There is fluid leaking through the cast.  You have very bad pain or pressure under the cast.  You have trouble breathing.  You have shortness of breath.  You have chest pain. This information is not intended to replace advice given to you by your health care provider. Make sure you discuss any questions you have with your health care provider. Document Released: 08/27/2010  Document Revised: 04/17/2016 Document Reviewed: 04/17/2016 Elsevier Interactive Patient Education  2017 [employer].

## 2017-10-04 NOTE — Evaluation (Signed)
Occupational Therapy Evaluation Patient Details Name: John Reilly MRN: [id number] DOB: 1956/01/17 Today's Date: 10/04/2017    History of Present Illness John Reilly is a 62yo white male who comes to [facility] from home after a fall home, impact to face, head, shoulder, imaging revealing of Left proximal humeral displaced/comminuted fracture, Left 2nd rib fracutre. Orthopedic consult asking NWB LUE, sling, and conservative management at this time. PMH: Parkinson's disease (diagnosed 6-9 MA), dementia, COPD, PVD, HTN, DM. In ED BG: 35, Na: 125 (pt reports as chronic), K:2.3. Pt reports estimated 4 falls in past 6 months which he attributes to parkinsonism. PMH: also includes severe Rt ankle fracture 5ya which has since limited mobility. Pt uses a QC at baseline, mostly AMB in the home.    Clinical Impression   Met pt sitting up in bed, agreeable to OT. Pt with Parkinson's disease and dementia at baseline, anxious and mildly agitated in some instances but easily redirectable. Pt completed bathing (using wipes) with min A and VC's, ultimately needing assist with barriers from LUE immobilization (reaching L underarm). Pt needing min A to don deodorant on LUE underarm. Pt donned underwear with min A, using bridging technique in bed, needing assist on L side. Gown donned with min A and VC's to dress affected LUE first, pt in understanding. Pt needing mod A to don sling 2/2 to pain, but aware of proper fit. Supine <> sit completed with min A for pt to use urinal. Sit <> stand completed with min A and RUE HHA, most assist needed for pt to push off bed with one UE. Bed <> chair completed with min A and HHA, pt needing VC's to slowly lower self to chair. Pt left in chair as PT arrived to begin session, nursing staff informed of position change and in room. Pt will benefit from [facility] to improve functional mobility and to increase engagement in BADL activity.    Follow Up Recommendations  Home health OT    Equipment  Recommendations       Recommendations for Other Services       Precautions / Restrictions Precautions Precautions: Shoulder Shoulder Interventions: Shoulder sling/immobilizer;At all times Required Braces or Orthoses: Sling Restrictions Weight Bearing Restrictions: Yes LUE Weight Bearing: Non weight bearing      Mobility Bed Mobility Overal bed mobility: Independent Bed Mobility: Supine to Sit     Supine to sit: Min assist     General bed mobility comments: pt needing assist for occasional trunk support (some LOB with trunk extension)  Transfers Overall transfer level: Needs assistance Equipment used: 1 person hand held assist Transfers: Sit to/from Stand Sit to Stand: Min assist         General transfer comment: HHA used from RUE, pt with increased anxiety, assist needed to push self up with one hand from NWB LUE    Balance Overall balance assessment: History of Falls;Needs assistance Sitting-balance support: Single extremity supported Sitting balance-[name]: Fair Sitting balance - Comments: one incidence of LOB in trunk extension, corrected self after increase time and min A from OT   Standing balance support: Single extremity supported;During functional activity Standing balance-[name]: Fair                             ADL either performed or assessed with clinical judgement   ADL Overall ADL's : Needs assistance/impaired Eating/Feeding: Minimal assistance Eating/Feeding Details (indicate cue type and reason): pt needing min A  to open packages 2/2 to LUE immobilization Grooming: Minimal assistance Grooming Details (indicate cue type and reason): pt requiring min A and VC's to apply deoderant 2/2 to LUE immobilization Upper Body Bathing: Minimal assistance Upper Body Bathing Details (indicate cue type and reason): pt needing min A and VC's 2/2 LUE immobilization (assist for L under arm and back) Lower Body Bathing: Minimal  assistance Lower Body Bathing Details (indicate cue type and reason): increased time at baseline, 2/2 to LUE immobilization Upper Body Dressing : Minimal assistance Upper Body Dressing Details (indicate cue type and reason): to don gown sitting up in bed, needing assist 2/2 LUE immobilization Lower Body Dressing: Minimal assistance Lower Body Dressing Details (indicate cue type and reason): to don socks  Toilet Transfer: Minimal assistance   Toileting- Clothing Manipulation and Hygiene: Set up [name] Manipulation Details (indicate cue type and reason): using urinal Tub/ Shower Transfer: Minimal assistance;Shower Scientist, research (medical) Details (indicate cue type and reason): uses shower seat at baseline Functional mobility during ADLs: Minimal assistance       Vision Patient Visual Report: No change from baseline       Perception     Praxis      Pertinent Vitals/Pain Pain Assessment: 0-10 Pain Score: 9  Pain Location: Left shoulder;  Pain Descriptors / Indicators: Aching Pain Intervention(s): Limited activity within patient's tolerance;RN gave pain meds during session;Monitored during session;Repositioned     Hand Dominance Right   Extremity/Trunk Assessment Upper Extremity Assessment Upper Extremity Assessment: LUE deficits/detail;RUE deficits/detail RUE Deficits / Details: tremor from baseline PD, RUE at baseline strength, WFL for ADLs LUE Deficits / Details: NWB, immobilization LUE: Unable to fully assess due to immobilization   Lower Extremity Assessment Lower Extremity Assessment: Overall WFL for tasks assessed       Communication Communication Communication: No difficulties   Cognition Arousal/Alertness: Awake/alert Behavior During Therapy: WFL for tasks assessed/performed;Anxious Overall Cognitive Status: Within Functional Limits for tasks assessed                                 General Comments: pt began to get slightly  agitated/ increased anxiety but easily redirected    General Comments       Exercises     Shoulder Instructions      Home Living Family/patient expects to be discharged to:: Private residence Living Arrangements: Spouse/significant other Available Help at Discharge: Family;Other (Comment)(grandchildren and wife) Type of Home: House Home Access: Stairs to enter [employer] of Steps: 3 to enter front with rails, 1 in garage with no rails   Home Layout: One level     Bathroom Shower/Tub: Walk-in shower         Home Equipment: Bedside commode;Cane - quad;Other (comment)   Additional Comments: uses BSC as shower bench      Prior Functioning/Environment Level of Independence: Independent with assistive device(s)        Comments: increased time to complete self care        OT Problem List: Decreased knowledge of use of DME or AE;Decreased range of motion;Decreased activity tolerance;Impaired UE functional use;Pain      OT Treatment/Interventions: Self-care/ADL training;Therapeutic activities;DME and/or AE instruction    OT Goals(Current goals can be found in the care plan section) Acute Rehab OT Goals Patient Stated Goal: to get back doing everything I used to do for myself OT Goal Formulation: With patient Time For Goal Achievement: 11/01/17 Potential  to Achieve Goals: Good  OT Frequency: Min 2X/week   Barriers to D/C:            Co-evaluation              AM-PAC PT "6 Clicks" Daily Activity     Outcome Measure Help from another person eating meals?: A Little Help from another person taking care of personal grooming?: A Little Help from another person toileting, which includes using toliet, bedpan, or urinal?: A Little Help from another person bathing (including washing, rinsing, drying)?: A Little Help from another person to put on and taking off regular upper body clothing?: A Little Help from another person to put on and taking off  regular lower body clothing?: A Little 6 Click Score: 18   End of Session Equipment Utilized During Treatment: Gait belt Nurse Communication: Mobility status  Activity Tolerance: Patient limited by pain Patient left: in chair;Other (comment)(PT coming in to begin session)  OT Visit Diagnosis: History of falling (Z91.81);Pain;Other abnormalities of gait and mobility (R26.89) Pain - Right/Left: Left Pain - part of body: Shoulder                Time: 8891-6945 OT Time Calculation (min): 41 min Charges:  OT General Charges $OT Visit: 1 Visit OT Evaluation $OT Eval Moderate Complexity: 1 Mod OT Treatments $Self Care/Home Management : 8-22 mins G-Codes:     Zenovia Jarred, MSOT, OTR/L   [place] 10/04/2017, 9:55 AM

## 2017-10-04 NOTE — Progress Notes (Signed)
Patient is being discharged home with family. Reviewed discharge meds, scripts, and follow-up appointment. IV removed with cath intact. Allowed time for questions. Sling in place.

## 2017-10-04 NOTE — Progress Notes (Signed)
His high Anion Gap is due to increasing blood sugar. Should improve once back on insulin pump at home.

## 2017-10-04 NOTE — Care Management (Addendum)
PCP Dr. Glendon Axe. RNCM spoke with patient regarding transition of care. He states he plans to return home with his wife today. He agrees with discharge. He agrees with home health services without preference.  Text message sent to Dr. Manuella Ghazi to see if he agrees and will order PT/OT.  [facility] to [facility]. URRN and Dr. Manuella Ghazi updated of code 77 flag (patient is not Medicare).

## 2017-10-07 NOTE — Discharge Summary (Signed)
[facility] NAME: Julies Carmickle    MR#:  [id number]  DATE OF BIRTH:  1955/05/21  DATE OF ADMISSION:  10/02/2017   ADMITTING PHYSICIAN: Amelia Jo, MD  DATE OF DISCHARGE: 10/04/2017 11:46 AM  PRIMARY CARE PHYSICIAN: Glendon Axe, MD   ADMISSION DIAGNOSIS:  Hypokalemia [E87.6] Hyponatremia [E87.1] Generalized weakness [R53.1] Fall, initial encounter [W19.XXXA] Shoulder fracture, left, closed, initial encounter [S42.92XA] Closed fracture of one rib of left side, initial encounter [S22.32XA] DISCHARGE DIAGNOSIS:  Active Problems:   Humeral fracture   Malnutrition of moderate degree  SECONDARY DIAGNOSIS:   Past Medical History:  Diagnosis Date  . Cancer ([place])    Pt reports on 10/02/17 that he has never been dx with cancer  . COPD (chronic obstructive pulmonary disease) ([place])   . Diabetes mellitus without complication ([place])   . GERD (gastroesophageal reflux disease)   . Hypertension   . Multiple duodenal ulcers   . Peripheral vascular disease ([facility])    Peripheral Neuropathy  . Retinopathy    HOSPITAL COURSE:  62 y.o. male with a known history of type 1 diabetes, Parkinson's disease and dementia. admitted s/p fall, found to have left proximal humerus fracture, hyponatremia, hypokalemia,.  1.  Left proximal humerus fracture: seen by Ortho - -Maintain sling left arm.   -No weight bearing left upper extremity -ice to left shoulder. Pain control -recommend raise HOB, OOB to chair for pulmonary toilet and shoulder pain -follow up within 1 week as outpatient with ortho   #2 acute on chronic hyponatremia, patient so told me that he has chronic hyponatremia, sodium 125-> 131 with IV fluids and Hold the Lasix.  #3 hypokalemia: Improved after replacement. DISCHARGE CONDITIONS:  stable CONSULTS OBTAINED:  Treatment Team:  Jaymes Graff, DO DRUG ALLERGIES:   Allergies  Allergen Reactions  . Penicillins Rash    Reaction:   Unknown; childhood reaction    DISCHARGE MEDICATIONS:   Allergies as of 10/04/2017      Reactions   Penicillins Rash   Reaction:  Unknown; childhood reaction       Medication List    STOP taking these medications   furosemide 20 MG tablet Commonly known as:  LASIX     TAKE these medications   acetaminophen 325 MG tablet Commonly known as:  TYLENOL Take 2 tablets (650 mg total) by mouth every 4 (four) hours as needed for moderate pain (or Fever >/= 101).   carbidopa-levodopa 25-100 MG tablet Commonly known as:  SINEMET IR Take 1 tablet by mouth 3 (three) times daily.   carbidopa-levodopa 50-200 MG tablet Commonly known as:  SINEMET CR Take 1 tablet by mouth at bedtime.   enalapril 20 MG tablet Commonly known as:  VASOTEC Take 20 mg by mouth 2 (two) times daily.   folic acid 1 MG tablet Commonly known as:  FOLVITE Take 1 mg by mouth daily.   glucagon 1 MG injection 1 mg by Other route once as needed (for severe hypoglycemia).   hydrALAZINE 25 MG tablet Commonly known as:  APRESOLINE Take 25 mg by mouth 2 (two) times daily.   HYDROcodone-acetaminophen 7.5-325 MG tablet Commonly known as:  NORCO Take 1 tablet by mouth every 6 (six) hours as needed for up to 5 days for severe pain.   insulin pump Soln Pt uses Humalog.   metoprolol succinate 100 MG 24 hr tablet Commonly known as:  TOPROL-XL Take 100 mg by mouth daily.   multivitamin with  minerals Tabs tablet Take 1 tablet by mouth daily.   omeprazole 40 MG capsule Commonly known as:  PRILOSEC Take 40 mg by mouth daily.   SUPER B-COMPLEX Tabs Take 1 tablet by mouth daily.       DISCHARGE INSTRUCTIONS:   DIET:  Regular diet DISCHARGE CONDITION:  Good ACTIVITY:  Activity as tolerated OXYGEN:  Home Oxygen: No.  Oxygen Delivery: room air DISCHARGE LOCATION:  home with HHPT, OT  If you experience worsening of your admission symptoms, develop shortness of breath, life threatening emergency,  suicidal or homicidal thoughts you must seek medical attention immediately by calling 911 or calling your MD immediately  if symptoms less severe.  You Must read complete instructions/literature along with all the possible adverse reactions/side effects for all the Medicines you take and that have been prescribed to you. Take any new Medicines after you have completely understood and accpet all the possible adverse reactions/side effects.   Please note  You were cared for by a hospitalist during your hospital stay. If you have any questions about your discharge medications or the care you received while you were in the hospital after you are discharged, you can call the unit and asked to speak with the hospitalist on call if the hospitalist that took care of you is not available. Once you are discharged, your primary care physician will handle any further medical issues. Please note that NO REFILLS for any discharge medications will be authorized once you are discharged, as it is imperative that you return to your primary care physician (or establish a relationship with a primary care physician if you do not have one) for your aftercare needs so that they can reassess your need for medications and monitor your lab values.    On the day of Discharge:  VITAL SIGNS:  Blood pressure 123/65, pulse 79, temperature 97.8 F (36.6 C), temperature source Oral, resp. rate 16, height 5\' 10"  (1.778 m), weight 73 kg (161 lb), SpO2 100 %. PHYSICAL EXAMINATION:  GENERAL:  62 y.o.-year-old patient lying in the bed with no acute distress.  EYES: Pupils equal, round, reactive to light and accommodation. No scleral icterus. Extraocular muscles intact.  HEENT: Head atraumatic, normocephalic. Oropharynx and nasopharynx clear.  NECK:  Supple, no jugular venous distention. No thyroid enlargement, no tenderness.  LUNGS: Normal breath sounds bilaterally, no wheezing, rales,rhonchi or crepitation. No use of accessory  muscles of respiration.  CARDIOVASCULAR: S1, S2 normal. No murmurs, rubs, or gallops.  ABDOMEN: Soft, non-tender, non-distended. Bowel sounds present. No organomegaly or mass.  EXTREMITIES: No pedal edema, cyanosis, or clubbing.  NEUROLOGIC: Cranial nerves II through XII are intact. Muscle strength 5/5 in all extremities. Sensation intact. Gait not checked.  PSYCHIATRIC: The patient is alert and oriented x 3.  SKIN: No obvious rash, lesion, or ulcer.  DATA REVIEW:   CBC Recent Labs  Lab 10/03/17 0443  WBC 7.5  HGB 10.6*  HCT 30.8*  PLT 227    Chemistries  Recent Labs  Lab 10/02/17 2224  10/04/17 0811  NA 125*   < > 131*  K 2.3*   < > 3.9  CL 88*   < > 96*  CO2 25   < > 18*  GLUCOSE 163*   < > 340*  BUN <5*   < > 10  CREATININE 0.49*   < > 0.80  CALCIUM 6.5*   < > 7.5*  AST 20  --   --   ALT <5*  --   --  ALKPHOS 137*  --   --   BILITOT 0.7  --   --    < > = values in this interval not displayed.     Follow-up Information    Glendon Axe, MD. Schedule an appointment as soon as possible for a visit in 3 day(s).   Specialty:  Internal Medicine Contact information: [place] [address] [phone]        Dereck Leep, MD. Schedule an appointment as soon as possible for a visit in 5 day(s).   Specialty:  Orthopedic Surgery Contact information: [place]  [address] [phone]           Management plans discussed with the patient, family and they are in agreement.  CODE STATUS: Prior   TOTAL TIME TAKING CARE OF THIS PATIENT: 45 minutes.    Max Sane M.D on 10/07/2017 at 2:38 PM  Between 7am to 6pm - Pager - [phone]  After 6pm go to [email or web address] - Proofreader  [facility]  Office  [phone]  CC: Primary care physician; Glendon Axe, MD   Note: This dictation was prepared with Dragon dictation along with smaller phrase  technology. Any transcriptional errors that result from this process are unintentional.

## 2017-10-11 ENCOUNTER — Telehealth: Payer: Self-pay

## 2017-10-11 NOTE — Telephone Encounter (Signed)
EMMI Follow-up: Noted on the report that the patient had other questions.  I talked with John Reilly and he said he need additional pain medication for his shoulder.  I told him he would have to follow-up with his physician and he asked if I had a phone number for John Reilly.  I looked online and found John Reilly and provided his phone number. No other needs at this time.

## 2017-10-13 ENCOUNTER — Emergency Department: Payer: BLUE CROSS/BLUE SHIELD

## 2017-10-13 ENCOUNTER — Inpatient Hospital Stay
Admission: EM | Admit: 2017-10-13 | Discharge: 2017-10-20 | DRG: 871 | Disposition: A | Discharge status: Home Health | Payer: BLUE CROSS/BLUE SHIELD | Attending: Internal Medicine | Admitting: Internal Medicine

## 2017-10-13 ENCOUNTER — Encounter: Payer: Self-pay | Admitting: *Deleted

## 2017-10-13 ENCOUNTER — Other Ambulatory Visit: Payer: Self-pay

## 2017-10-13 DIAGNOSIS — A419 Sepsis, unspecified organism: Secondary | ICD-10-CM

## 2017-10-13 DIAGNOSIS — D638 Anemia in other chronic diseases classified elsewhere: Secondary | ICD-10-CM | POA: Diagnosis present

## 2017-10-13 DIAGNOSIS — G9341 Metabolic encephalopathy: Secondary | ICD-10-CM | POA: Diagnosis present

## 2017-10-13 DIAGNOSIS — E1140 Type 2 diabetes mellitus with diabetic neuropathy, unspecified: Secondary | ICD-10-CM | POA: Diagnosis present

## 2017-10-13 DIAGNOSIS — R0902 Hypoxemia: Secondary | ICD-10-CM

## 2017-10-13 DIAGNOSIS — G629 Polyneuropathy, unspecified: Secondary | ICD-10-CM | POA: Diagnosis present

## 2017-10-13 DIAGNOSIS — E871 Hypo-osmolality and hyponatremia: Secondary | ICD-10-CM | POA: Diagnosis present

## 2017-10-13 DIAGNOSIS — Z8711 Personal history of peptic ulcer disease: Secondary | ICD-10-CM

## 2017-10-13 DIAGNOSIS — E11319 Type 2 diabetes mellitus with unspecified diabetic retinopathy without macular edema: Secondary | ICD-10-CM | POA: Diagnosis present

## 2017-10-13 DIAGNOSIS — E860 Dehydration: Secondary | ICD-10-CM | POA: Diagnosis present

## 2017-10-13 DIAGNOSIS — E1151 Type 2 diabetes mellitus with diabetic peripheral angiopathy without gangrene: Secondary | ICD-10-CM | POA: Diagnosis present

## 2017-10-13 DIAGNOSIS — Z96669 Presence of unspecified artificial ankle joint: Secondary | ICD-10-CM | POA: Diagnosis present

## 2017-10-13 DIAGNOSIS — R6520 Severe sepsis without septic shock: Secondary | ICD-10-CM | POA: Diagnosis present

## 2017-10-13 DIAGNOSIS — N179 Acute kidney failure, unspecified: Secondary | ICD-10-CM | POA: Diagnosis present

## 2017-10-13 DIAGNOSIS — Z794 Long term (current) use of insulin: Secondary | ICD-10-CM

## 2017-10-13 DIAGNOSIS — E11649 Type 2 diabetes mellitus with hypoglycemia without coma: Secondary | ICD-10-CM | POA: Diagnosis not present

## 2017-10-13 DIAGNOSIS — J9600 Acute respiratory failure, unspecified whether with hypoxia or hypercapnia: Secondary | ICD-10-CM | POA: Diagnosis present

## 2017-10-13 DIAGNOSIS — R748 Abnormal levels of other serum enzymes: Secondary | ICD-10-CM | POA: Diagnosis not present

## 2017-10-13 DIAGNOSIS — J449 Chronic obstructive pulmonary disease, unspecified: Secondary | ICD-10-CM | POA: Diagnosis present

## 2017-10-13 DIAGNOSIS — Z23 Encounter for immunization: Secondary | ICD-10-CM

## 2017-10-13 DIAGNOSIS — Z9641 Presence of insulin pump (external) (internal): Secondary | ICD-10-CM | POA: Diagnosis present

## 2017-10-13 DIAGNOSIS — E1110 Type 2 diabetes mellitus with ketoacidosis without coma: Secondary | ICD-10-CM | POA: Diagnosis present

## 2017-10-13 DIAGNOSIS — E861 Hypovolemia: Secondary | ICD-10-CM | POA: Diagnosis present

## 2017-10-13 DIAGNOSIS — N390 Urinary tract infection, site not specified: Secondary | ICD-10-CM | POA: Diagnosis present

## 2017-10-13 DIAGNOSIS — E1310 Other specified diabetes mellitus with ketoacidosis without coma: Secondary | ICD-10-CM

## 2017-10-13 DIAGNOSIS — F1010 Alcohol abuse, uncomplicated: Secondary | ICD-10-CM | POA: Diagnosis present

## 2017-10-13 DIAGNOSIS — Z880 Allergy status to penicillin: Secondary | ICD-10-CM

## 2017-10-13 DIAGNOSIS — R7881 Bacteremia: Secondary | ICD-10-CM | POA: Diagnosis not present

## 2017-10-13 DIAGNOSIS — F0280 Dementia in other diseases classified elsewhere without behavioral disturbance: Secondary | ICD-10-CM | POA: Diagnosis present

## 2017-10-13 DIAGNOSIS — S2232XD Fracture of one rib, left side, subsequent encounter for fracture with routine healing: Secondary | ICD-10-CM

## 2017-10-13 DIAGNOSIS — A4189 Other specified sepsis: Secondary | ICD-10-CM | POA: Diagnosis not present

## 2017-10-13 DIAGNOSIS — G20 Parkinson's disease: Secondary | ICD-10-CM | POA: Diagnosis present

## 2017-10-13 DIAGNOSIS — Z9181 History of falling: Secondary | ICD-10-CM

## 2017-10-13 DIAGNOSIS — I10 Essential (primary) hypertension: Secondary | ICD-10-CM | POA: Diagnosis present

## 2017-10-13 DIAGNOSIS — K219 Gastro-esophageal reflux disease without esophagitis: Secondary | ICD-10-CM | POA: Diagnosis present

## 2017-10-13 DIAGNOSIS — Z87891 Personal history of nicotine dependence: Secondary | ICD-10-CM

## 2017-10-13 DIAGNOSIS — Z79899 Other long term (current) drug therapy: Secondary | ICD-10-CM

## 2017-10-13 DIAGNOSIS — R571 Hypovolemic shock: Secondary | ICD-10-CM | POA: Diagnosis not present

## 2017-10-13 DIAGNOSIS — E876 Hypokalemia: Secondary | ICD-10-CM | POA: Diagnosis present

## 2017-10-13 DIAGNOSIS — S4292XD Fracture of left shoulder girdle, part unspecified, subsequent encounter for fracture with routine healing: Secondary | ICD-10-CM

## 2017-10-13 DIAGNOSIS — R402413 Glasgow coma scale score 13-15, at hospital admission: Secondary | ICD-10-CM | POA: Diagnosis present

## 2017-10-13 LAB — URINALYSIS, ROUTINE W REFLEX MICROSCOPIC
Bilirubin Urine: NEGATIVE
Ketones, ur: 20 mg/dL — AB
NITRITE: NEGATIVE
Protein, ur: 100 mg/dL — AB
SPECIFIC GRAVITY, URINE: 1.015 (ref 1.005–1.030)
WBC, UA: >50 WBC/hpf — ABNORMAL HIGH (ref 0–5)
pH: 5 (ref 5.0–8.0)

## 2017-10-13 LAB — CBC WITH DIFFERENTIAL/PLATELET
BASOS PCT: 1 %
Basophils Absolute: 0.1 10*3/uL (ref 0–0.1)
Eosinophils Absolute: 0 10*3/uL (ref 0–0.7)
Eosinophils Relative: 0 %
HCT: 33.2 % — ABNORMAL LOW (ref 40.0–52.0)
Hemoglobin: 10 g/dL — ABNORMAL LOW (ref 13.0–18.0)
Lymphocytes Relative: 22 %
Lymphs Abs: 2.5 10*3/uL (ref 1.0–3.6)
MCH: 35 pg — ABNORMAL HIGH (ref 26.0–34.0)
MCHC: 30.1 g/dL — AB (ref 32.0–36.0)
MCV: 116.4 fL — ABNORMAL HIGH (ref 80.0–100.0)
MONO ABS: 1 10*3/uL (ref 0.2–1.0)
MONOS PCT: 9 %
NEUTROS PCT: 68 %
Neutro Abs: 7.6 10*3/uL — ABNORMAL HIGH (ref 1.4–6.5)
Platelets: 668 10*3/uL — ABNORMAL HIGH (ref 150–440)
RBC: 2.85 MIL/uL — ABNORMAL LOW (ref 4.40–5.90)
RDW: 16.2 % — AB (ref 11.5–14.5)
WBC: 11.2 10*3/uL — ABNORMAL HIGH (ref 3.8–10.6)

## 2017-10-13 LAB — COMPREHENSIVE METABOLIC PANEL
ALT: <5 U/L — ABNORMAL LOW (ref 17–63)
AST: 32 U/L (ref 15–41)
Albumin: 2.6 g/dL — ABNORMAL LOW (ref 3.5–5.0)
Alkaline Phosphatase: 149 U/L — ABNORMAL HIGH (ref 38–126)
BILIRUBIN TOTAL: 1.9 mg/dL — AB (ref 0.3–1.2)
BUN: 12 mg/dL (ref 6–20)
CHLORIDE: 96 mmol/L — AB (ref 101–111)
CO2: <7 mmol/L — ABNORMAL LOW (ref 22–32)
CREATININE: 1.58 mg/dL — AB (ref 0.61–1.24)
Calcium: 7.8 mg/dL — ABNORMAL LOW (ref 8.9–10.3)
GFR, EST AFRICAN AMERICAN: 52 mL/min — AB (ref >60)
GFR, EST NON AFRICAN AMERICAN: 45 mL/min — AB (ref >60)
Glucose, Bld: 765 mg/dL (ref 65–99)
POTASSIUM: 4.1 mmol/L (ref 3.5–5.1)
Sodium: 133 mmol/L — ABNORMAL LOW (ref 135–145)
TOTAL PROTEIN: 5.8 g/dL — AB (ref 6.5–8.1)

## 2017-10-13 LAB — GLUCOSE, CAPILLARY
GLUCOSE-CAPILLARY: 441 mg/dL — AB (ref 65–99)
Glucose-Capillary: 543 mg/dL (ref 65–99)
Glucose-Capillary: 552 mg/dL (ref 65–99)
Glucose-Capillary: 582 mg/dL (ref 65–99)
Glucose-Capillary: 491 mg/dL — ABNORMAL HIGH (ref 65–99)

## 2017-10-13 LAB — BASIC METABOLIC PANEL
BUN: 13 mg/dL (ref 6–20)
BUN: 12 mg/dL (ref 6–20)
CALCIUM: 6.7 mg/dL — AB (ref 8.9–10.3)
CHLORIDE: 104 mmol/L (ref 101–111)
CO2: <7 mmol/L — ABNORMAL LOW (ref 22–32)
CREATININE: 1.46 mg/dL — AB (ref 0.61–1.24)
Calcium: 6.8 mg/dL — ABNORMAL LOW (ref 8.9–10.3)
Chloride: 103 mmol/L (ref 101–111)
Creatinine, Ser: 1.35 mg/dL — ABNORMAL HIGH (ref 0.61–1.24)
GFR calc Af Amer: 58 mL/min — ABNORMAL LOW (ref >60)
GFR calc Af Amer: >60 mL/min (ref >60)
GFR, EST NON AFRICAN AMERICAN: 50 mL/min — AB (ref >60)
GFR, EST NON AFRICAN AMERICAN: 55 mL/min — AB (ref >60)
GLUCOSE: 732 mg/dL — AB (ref 65–99)
GLUCOSE: 681 mg/dL — AB (ref 65–99)
POTASSIUM: 3.9 mmol/L (ref 3.5–5.1)
Potassium: 4 mmol/L (ref 3.5–5.1)
Sodium: 136 mmol/L (ref 135–145)
Sodium: 137 mmol/L (ref 135–145)

## 2017-10-13 LAB — CBC
HEMATOCRIT: 27.5 % — AB (ref 40.0–52.0)
HEMOGLOBIN: 8.6 g/dL — AB (ref 13.0–18.0)
MCH: 35.7 pg — ABNORMAL HIGH (ref 26.0–34.0)
MCHC: 31.4 g/dL — AB (ref 32.0–36.0)
MCV: 113.8 fL — AB (ref 80.0–100.0)
Platelets: 480 10*3/uL — ABNORMAL HIGH (ref 150–440)
RBC: 2.42 MIL/uL — ABNORMAL LOW (ref 4.40–5.90)
RDW: 15.6 % — AB (ref 11.5–14.5)
WBC: 13 10*3/uL — ABNORMAL HIGH (ref 3.8–10.6)

## 2017-10-13 LAB — LACTIC ACID, PLASMA
LACTIC ACID, VENOUS: 9.2 mmol/L — AB (ref 0.5–1.9)
LACTIC ACID, VENOUS: 7.1 mmol/L — AB (ref 0.5–1.9)
Lactic Acid, Venous: 6.8 mmol/L (ref 0.5–1.9)

## 2017-10-13 LAB — TROPONIN I
TROPONIN I: 0.03 ng/mL — AB (ref <0.03)
Troponin I: 0.08 ng/mL (ref <0.03)

## 2017-10-13 LAB — HEMOGLOBIN A1C
HEMOGLOBIN A1C: 7 % — AB (ref 4.8–5.6)
Mean Plasma Glucose: 154.2 mg/dL

## 2017-10-13 LAB — MRSA PCR SCREENING: MRSA by PCR: POSITIVE — AB

## 2017-10-13 LAB — ETHANOL: Alcohol, Ethyl (B): <10 mg/dL (ref <10)

## 2017-10-13 LAB — LIPASE, BLOOD: LIPASE: 20 U/L (ref 11–51)

## 2017-10-13 LAB — PROCALCITONIN: Procalcitonin: 0.45 ng/mL

## 2017-10-13 LAB — AMMONIA: AMMONIA: 107 umol/L — AB (ref 9–35)

## 2017-10-13 MED ORDER — SODIUM CHLORIDE 0.9 % IV BOLUS
1000.0000 mL | Freq: Once | INTRAVENOUS | Status: AC
  Administered 2017-10-13: 1000 mL via INTRAVENOUS

## 2017-10-13 MED ORDER — FOLIC ACID 1 MG PO TABS: 1.0000 mg | ORAL_TABLET | Freq: Every day | ORAL | Status: DC

## 2017-10-13 MED ORDER — LEVOFLOXACIN IN D5W 750 MG/150ML IV SOLN
750.0000 mg | Freq: Once | INTRAVENOUS | Status: AC
  Administered 2017-10-13: 750 mg via INTRAVENOUS
  Filled 2017-10-13: qty 150

## 2017-10-13 MED ORDER — METOPROLOL SUCCINATE ER 50 MG PO TB24: 100.0000 mg | ORAL_TABLET | Freq: Every day | ORAL | Status: DC

## 2017-10-13 MED ORDER — DEXTROSE-NACL 5-0.45 % IV SOLN: INTRAVENOUS | Status: DC

## 2017-10-13 MED ORDER — SODIUM CHLORIDE 0.9 % IV SOLN
INTRAVENOUS | Status: DC
  Administered 2017-10-13: 21:00:00 via INTRAVENOUS

## 2017-10-13 MED ORDER — ADULT MULTIVITAMIN W/MINERALS CH
1.0000 | ORAL_TABLET | Freq: Every day | ORAL | Status: DC
Start: 2017-10-13 — End: 2017-10-13

## 2017-10-13 MED ORDER — SODIUM BICARBONATE 8.4 % IV SOLN
50.0000 meq | Freq: Once | INTRAVENOUS | Status: AC
  Administered 2017-10-13: 50 meq via INTRAVENOUS
  Filled 2017-10-13: qty 50

## 2017-10-13 MED ORDER — CARBIDOPA-LEVODOPA 25-100 MG PO TABS
2.0000 | ORAL_TABLET | Freq: Three times a day (TID) | ORAL | Status: DC
  Administered 2017-10-14 – 2017-10-20 (×19): 2 via ORAL
  Filled 2017-10-13 (×21): qty 2

## 2017-10-13 MED ORDER — CARBIDOPA-LEVODOPA ER 50-200 MG PO TBCR
1.0000 | EXTENDED_RELEASE_TABLET | Freq: Every day | ORAL | Status: DC
  Administered 2017-10-13 – 2017-10-19 (×7): 1 via ORAL
  Filled 2017-10-13 (×8): qty 1

## 2017-10-13 MED ORDER — SODIUM CHLORIDE 0.9 % IV SOLN
INTRAVENOUS | Status: AC
  Administered 2017-10-13: 20:00:00 via INTRAVENOUS

## 2017-10-13 MED ORDER — ACETAMINOPHEN 650 MG RE SUPP
RECTAL | Status: AC
  Filled 2017-10-13: qty 1

## 2017-10-13 MED ORDER — ADULT MULTIVITAMIN W/MINERALS CH
1.0000 | ORAL_TABLET | Freq: Every day | ORAL | Status: DC
  Administered 2017-10-14 – 2017-10-20 (×7): 1 via ORAL
  Filled 2017-10-13 (×7): qty 1

## 2017-10-13 MED ORDER — DEXTROSE-NACL 5-0.45 % IV SOLN
INTRAVENOUS | Status: DC
  Administered 2017-10-14: 06:00:00 via INTRAVENOUS

## 2017-10-13 MED ORDER — LORAZEPAM 2 MG/ML IJ SOLN: 1.0000 mg | Freq: Four times a day (QID) | INTRAMUSCULAR | Status: AC | PRN

## 2017-10-13 MED ORDER — ACETAMINOPHEN 650 MG RE SUPP
650.0000 mg | Freq: Once | RECTAL | Status: AC
  Administered 2017-10-13: 650 mg via RECTAL

## 2017-10-13 MED ORDER — AZTREONAM 2 G IJ SOLR
2.0000 g | Freq: Once | INTRAMUSCULAR | Status: AC
  Administered 2017-10-13: 2 g via INTRAVENOUS
  Filled 2017-10-13: qty 2

## 2017-10-13 MED ORDER — POTASSIUM CHLORIDE 10 MEQ/100ML IV SOLN
10.0000 meq | INTRAVENOUS | Status: AC
  Administered 2017-10-13 (×2): 10 meq via INTRAVENOUS
  Filled 2017-10-13 (×2): qty 100

## 2017-10-13 MED ORDER — VANCOMYCIN HCL IN DEXTROSE 1-5 GM/200ML-% IV SOLN
1000.0000 mg | Freq: Once | INTRAVENOUS | Status: AC
  Administered 2017-10-14: 1000 mg via INTRAVENOUS
  Filled 2017-10-13: qty 200

## 2017-10-13 MED ORDER — HEPARIN SODIUM (PORCINE) 5000 UNIT/ML IJ SOLN
5000.0000 [IU] | Freq: Three times a day (TID) | INTRAMUSCULAR | Status: DC
  Administered 2017-10-13 – 2017-10-20 (×20): 5000 [IU] via SUBCUTANEOUS
  Filled 2017-10-13 (×19): qty 1

## 2017-10-13 MED ORDER — FOLIC ACID 1 MG PO TABS
1.0000 mg | ORAL_TABLET | Freq: Every day | ORAL | Status: DC
  Administered 2017-10-14 – 2017-10-20 (×7): 1 mg via ORAL
  Filled 2017-10-13 (×7): qty 1

## 2017-10-13 MED ORDER — SODIUM CHLORIDE 0.9 % IV BOLUS: 500.0000 mL | Freq: Once | INTRAVENOUS | Status: DC

## 2017-10-13 MED ORDER — VITAMIN B-1 100 MG PO TABS
100.0000 mg | ORAL_TABLET | Freq: Every day | ORAL | Status: DC
  Administered 2017-10-14 – 2017-10-19 (×6): 100 mg via ORAL
  Filled 2017-10-13 (×7): qty 1

## 2017-10-13 MED ORDER — PANTOPRAZOLE SODIUM 40 MG PO TBEC
40.0000 mg | DELAYED_RELEASE_TABLET | Freq: Every day | ORAL | Status: DC
  Administered 2017-10-14 – 2017-10-20 (×7): 40 mg via ORAL
  Filled 2017-10-13 (×7): qty 1

## 2017-10-13 MED ORDER — SODIUM CHLORIDE 0.9 % IV SOLN
INTRAVENOUS | Status: DC
  Administered 2017-10-13: 5.4 [IU]/h via INTRAVENOUS
  Filled 2017-10-13: qty 1

## 2017-10-13 MED ORDER — ACETAMINOPHEN 325 MG PO TABS
650.0000 mg | ORAL_TABLET | ORAL | Status: DC | PRN
  Administered 2017-10-14 – 2017-10-17 (×8): 650 mg via ORAL
  Filled 2017-10-13 (×8): qty 2

## 2017-10-13 MED ORDER — AMLODIPINE BESYLATE 5 MG PO TABS: 5.0000 mg | ORAL_TABLET | Freq: Every day | ORAL | Status: DC | PRN

## 2017-10-13 MED ORDER — PHENYLEPHRINE HCL-NACL 10-0.9 MG/250ML-% IV SOLN
0.0000 ug/min | INTRAVENOUS | Status: DC
  Administered 2017-10-13: 100 ug/min via INTRAVENOUS
  Administered 2017-10-14: 33.333 ug/min via INTRAVENOUS
  Filled 2017-10-13 (×2): qty 250

## 2017-10-13 MED ORDER — SODIUM CHLORIDE 0.9 % IV SOLN
INTRAVENOUS | Status: DC
  Administered 2017-10-14: 9.6 [IU]/h via INTRAVENOUS
  Filled 2017-10-13 (×3): qty 1

## 2017-10-13 MED ORDER — LORAZEPAM 1 MG PO TABS: 1.0000 mg | ORAL_TABLET | Freq: Four times a day (QID) | ORAL | Status: AC | PRN

## 2017-10-13 MED ORDER — STERILE WATER FOR INJECTION IV SOLN
Freq: Once | INTRAVENOUS | Status: AC
  Administered 2017-10-13: 18:00:00 via INTRAVENOUS
  Filled 2017-10-13: qty 850

## 2017-10-13 MED ORDER — THIAMINE HCL 100 MG/ML IJ SOLN
100.0000 mg | Freq: Every day | INTRAMUSCULAR | Status: DC
  Filled 2017-10-13: qty 2

## 2017-10-13 NOTE — ED Notes (Signed)
fsbs 543

## 2017-10-13 NOTE — ED Notes (Signed)
Pt has insulin pump and its dc'ed.

## 2017-10-13 NOTE — ED Notes (Signed)
primedoc in with pt 

## 2017-10-13 NOTE — ED Notes (Signed)
Less than 28mls in foley bag.  Iv fluids infusing.

## 2017-10-13 NOTE — ED Notes (Signed)
Report called to ccu nurse dale rn

## 2017-10-13 NOTE — ED Notes (Signed)
Pt to ccu.  Insulin drip infusing.  Pt awake. Sinus tach on monitor.  Iv fluids also infusing.

## 2017-10-13 NOTE — Progress Notes (Signed)
Family Meeting Note  Advance Directive:yes  Today a meeting took place with the Patient, wife.  Patient is unable to participate due [id number] capacity Acute encephalopathy   The following clinical team members were present during this meeting:MD  The following were discussed:Patient's diagnosis: DKA, Parkinson's disease with dementia, left shoulder fracture, left rib fracture, heavy alcohol abuse, chronic hyponatremia, Patient's progosis: Unable to determine and Goals for treatment: Full Code  Additional follow-up to be provided: prn  Time spent during discussion:20 minutes  Gorden Harms, MD

## 2017-10-13 NOTE — ED Notes (Signed)
nonrebreather d'ced by primedoc.

## 2017-10-13 NOTE — ED Notes (Signed)
MD Quale aware of pt CO <7 and Glucose of 732.

## 2017-10-13 NOTE — Consult Note (Signed)
Name: John Reilly MRN: [id number] DOB: [birth year]    ADMISSION DATE:  10/13/2017 CONSULTATION DATE: 10/13/2017  REFERRING MD : Dr. Jerelyn Charles   CHIEF COMPLAINT: AMS   BRIEF PATIENT DESCRIPTION:  62 yo male admitted with sepsis secondary to UTI and acute encephalopathy secondary to DKA requiring insulin gtt   SIGNIFICANT EVENTS/STUDIES:  06/5 Pt admitted to ICU   HISTORY OF PRESENT ILLNESS:   This is a 62 yo male with a PMH of Retinopathy, PVD, Parkinson's, Dementia, ETOH Abuse, Multiple Duodenal Ulcers, HTN, GERD, Diabetes Mellitus, and COPD.  He [facility] to [facility] ER via EMS on 06/5 with altered mental status and elevated blood sugars.  Per EMS arrival at pts home he was mildly combative, tachycardic, blood sugar >600, and end tidal CO2 8.  The pts family reported he has had problems with his insulin pump and elevated blood sugars recently.  In the ER the pt was confused, hypotensive, tachycardic, and febrile.  It was also noted the pt had a severely elevated respiratory rate secondary to kussmaul respirations.  Lab results revealed CO2 <7, glucose 765, calcium 7.8, troponin 0.03, lactic acid 9.2, and hgb 10.0.  UA positive for UTI and CXR negative. Therefore, sepsis protocol initiated he received iv abx and fluids.  Lab results also ruled pt in for DKA, insulin and sodium bicarb gtts started.  He was subsequently admitted to ICU by hospitalist team for further workup and treatment.  PAST MEDICAL HISTORY :   has a past medical history of Cancer ([place]), COPD (chronic obstructive pulmonary disease) ([place]), Diabetes mellitus without complication ([place]), GERD (gastroesophageal reflux disease), Hypertension, Multiple duodenal ulcers, Peripheral vascular disease ([place]), and Retinopathy.  has a past surgical history that includes Ankle arthroplasty and Colonoscopy with propofol (N/A, 10/12/2016). Prior to Admission medications   Medication Sig Start Date End Date Taking? Authorizing Provider  B  Complex-Biotin-FA (SUPER B-COMPLEX) TABS Take 1 tablet by mouth daily.   Yes [provider]  carbidopa-levodopa (SINEMET CR) 50-200 MG tablet Take 1 tablet by mouth at bedtime. 07/22/17  Yes [provider]  carbidopa-levodopa (SINEMET IR) 25-100 MG tablet Take 2 tablets by mouth 3 (three) times daily.  04/22/17 10/19/17 Yes [provider]  folic acid (FOLVITE) 1 MG tablet Take 1 mg by mouth daily.   Yes [provider]  glucagon 1 MG injection 1 mg by Other route once as needed (for severe hypoglycemia).    Yes [provider]  Insulin Human (INSULIN PUMP) SOLN Pt uses Humalog.   Yes [provider]  metoprolol succinate (TOPROL-XL) 100 MG 24 hr tablet Take 100 mg by mouth daily.    Yes [provider]  Multiple Vitamin (MULTIVITAMIN WITH MINERALS) TABS tablet Take 1 tablet by mouth daily.   Yes [provider]  omeprazole (PRILOSEC) 40 MG capsule Take 40 mg by mouth daily.   Yes [provider]  acetaminophen (TYLENOL) 325 MG tablet Take 2 tablets (650 mg total) by mouth every 4 (four) hours as needed for moderate pain (or Fever >/= 101). 10/04/17   Max Sane, MD  amLODipine (NORVASC) 5 MG tablet Take 1 tablet by mouth daily as needed (BP OVER 160/90).  07/22/17   [provider]   Allergies  Allergen Reactions  . Penicillins Rash    Has patient had a PCN reaction causing immediate rash, facial/tongue/throat swelling, SOB or lightheadedness with hypotension: Yes Has patient had a PCN reaction causing severe rash involving mucus membranes or skin necrosis:  No Has patient had a PCN reaction that required hospitalization: No Has patient had a PCN reaction occurring within the last 10 years: No If all of the above answers are "NO", then may proceed with Cephalosporin use.    FAMILY HISTORY:  family history includes Alzheimer's disease in his father; Diabetes in his brother; Stroke in his mother. SOCIAL  HISTORY:  reports that he quit smoking about 11 years ago. He has never used smokeless tobacco. He reports that he drinks about 25.2 oz of alcohol per week. He reports that he does not use drugs.  REVIEW OF SYSTEMS:   Unable to assess pt confused   SUBJECTIVE:  Unable to assess pt confused   VITAL SIGNS: Temp:  [99.7 F (37.6 C)-100.7 F (38.2 C)] 100.7 F (38.2 C) (06/05 1801) Pulse Rate:  [113-136] 113 (06/05 1924) Resp:  [27-41] 27 (06/05 1924) BP: (87-101)/(41-55) 87/41 (06/05 1924) SpO2:  [100 %] 100 % (06/05 1924) Weight:  [79.4 kg (175 lb)] 79.4 kg (175 lb) (06/05 1721)  PHYSICAL EXAMINATION: General: acutely ill appearing male in mild respiratory distress  Neuro: confused, follows commands, PERRLA HEENT: supple, no JVD  Cardiovascular: sinus tach, no R/G, 2+ pedal pulses via doppler Lungs: clear throughout, tachypneic Abdomen: +BS x4, soft, non tender, non distended Musculoskeletal: trace bilateral lower extremity edema Skin: chronic vascular discoloration bilateral lower extremities  Recent Labs  Lab 10/13/17 1729 10/13/17 1841  NA 133* 136  K 4.1 4.0  CL 96* 103  CO2 <7* <7*  BUN 12 13  CREATININE 1.58* 1.46*  GLUCOSE 765* 732*   Recent Labs  Lab 10/13/17 1729  HGB 10.0*  HCT 33.2*  WBC 11.2*  PLT 668*   Dg Chest Port 1 View  Result Date: 10/13/2017 CLINICAL DATA:  Altered mental status. EXAM: PORTABLE CHEST 1 VIEW COMPARISON:  10/02/2017 FINDINGS: Minimal scarring at the left base. No acute airspace opacities or effusions. Heart is normal size. No acute bony abnormality. IMPRESSION: No active disease. Electronically Signed   By: Rolm Baptise M.D.   On: 10/13/2017 18:12    ASSESSMENT / PLAN: Acute encephalopathy secondary to DKA Acute respiratory failure secondary to DKA  Sepsis secondary to UTI DKA Hypotension secondary to sepsis and hypovolemia  Acute renal failure secondary to hypovolemia  Lactic acidosis  Mildly elevated troponin likely  secondary to sepsis and DKA  Anemia without obvious acute blood loss  Hx: ETOH Abuse, COPD, Diabetes Mellitus, and Dementia  P: Supplemental O2 for dyspnea and/or hypoxia  Continuous telemetry monitoring  Trend troponin's  Aggressive fluid resuscitation to maintain map >65 if remains hypotensive will start neo-synephrine  DKA protocol until anion gap closed Trend WBC and monitor fever curve  Trend PCT and lactic acid  Follow cultures  Continue current abx Trend BMP  Replace electrolytes as indicated  Monitor UOP  VTE px: subq heparin  Trend CBC  Monitor for s/sx of bleeding and transfuse for hgb <7 Urine drug screen and ethanol levels pending  CIWA protocol  Once mentation improves will need ETOH cessation counseling   Marda Stalker, [place] Pager [phone] (please enter 7 digits) PCCM Consult Pager [phone] (please enter 7 digits)

## 2017-10-13 NOTE — ED Notes (Addendum)
Pt incontinent of urine and stool  on arrival to er.

## 2017-10-13 NOTE — ED Provider Notes (Signed)
[facility] Emergency Department Provider Note ____________________________________________   First MD Initiated Contact with Patient 10/13/17 1728     (approximate)  I have reviewed the triage vital signs and the nursing notes.   HISTORY  Chief Complaint Altered Mental Status  EM caveat: The patient is confused, very somnolent, unable to answer more than just his own name.  No family is immediately available.  EMS provides history  HPI John Reilly is a 62 y.o. male presents for evaluation for change in mental status and elevated blood sugar  Had a recent fall broke his left shoulder.  Family on scene reported to EMS that he had problems with his blood sugar recently and with his insulin pump.  Is become very somnolent, almost unarousable today.  Prompting called 911.  EMS reports patient slightly combative with them, would not allow IV access, but notably tachycardic and they suspect with an end-tidal CO2 of 8 and a blood sugar greater than 600 that he may be in DKA.    Past Medical History:  Diagnosis Date  . Cancer ([place])    Pt reports on 10/02/17 that he has never been dx with cancer  . COPD (chronic obstructive pulmonary disease) ([place])   . Diabetes mellitus without complication ([place])   . GERD (gastroesophageal reflux disease)   . Hypertension   . Multiple duodenal ulcers   . Peripheral vascular disease ([facility])    Peripheral Neuropathy  . Retinopathy     Patient Active Problem List   Diagnosis Date Noted  . DKA (diabetic ketoacidoses) ([place]) 10/13/2017  . Malnutrition of moderate degree 10/04/2017  . Humeral fracture 10/03/2017  . Ulcer of toe ([place]) 04/27/2015  . Hyponatremia 03/15/2015    Past Surgical History:  Procedure Laterality Date  . ANKLE ARTHROPLASTY     2013  . COLONOSCOPY WITH PROPOFOL N/A 10/12/2016   Procedure: COLONOSCOPY WITH PROPOFOL;  Surgeon: Manya Silvas, MD;  Location: [facility] ENDOSCOPY;  Service: Endoscopy;   Laterality: N/A;    Prior to Admission medications   Medication Sig Start Date End Date Taking? Authorizing Provider  B Complex-Biotin-FA (SUPER B-COMPLEX) TABS Take 1 tablet by mouth daily.   Yes [provider]  carbidopa-levodopa (SINEMET CR) 50-200 MG tablet Take 1 tablet by mouth at bedtime. 07/22/17  Yes [provider]  carbidopa-levodopa (SINEMET IR) 25-100 MG tablet Take 2 tablets by mouth 3 (three) times daily.  04/22/17 10/19/17 Yes [provider]  folic acid (FOLVITE) 1 MG tablet Take 1 mg by mouth daily.   Yes [provider]  glucagon 1 MG injection 1 mg by Other route once as needed (for severe hypoglycemia).    Yes [provider]  Insulin Human (INSULIN PUMP) SOLN Pt uses Humalog.   Yes [provider]  metoprolol succinate (TOPROL-XL) 100 MG 24 hr tablet Take 100 mg by mouth daily.    Yes [provider]  Multiple Vitamin (MULTIVITAMIN WITH MINERALS) TABS tablet Take 1 tablet by mouth daily.   Yes [provider]  omeprazole (PRILOSEC) 40 MG capsule Take 40 mg by mouth daily.   Yes [provider]  acetaminophen (TYLENOL) 325 MG tablet Take 2 tablets (650 mg total) by mouth every 4 (four) hours as needed for moderate pain (or Fever >/= 101). 10/04/17   Max Sane, MD  amLODipine (NORVASC) 5 MG tablet Take 1 tablet by mouth daily as needed (BP OVER 160/90).  07/22/17   [provider]  Allergies Penicillins  Family History  Problem Relation Age of Onset  . Stroke Mother   . Alzheimer's disease Father   . Diabetes Brother     Social History Social History   Tobacco Use  . Smoking status: Former Smoker    Last attempt to quit: 10/21/2005    Years since quitting: 11.9  . Smokeless tobacco: Never Used  Substance Use Topics  . Alcohol use: Yes    Alcohol/week: 25.2 oz    Types: 42 Cans of beer per week    Comment: patient states he drinks 1 quart of beer per day  . Drug use:  No    Review of Systems EM caveat   ____________________________________________   PHYSICAL EXAM:  VITAL SIGNS: ED Triage Vitals  Enc Vitals Group     BP --      Pulse Rate 10/13/17 1723 (!) 136     Resp 10/13/17 1723 (!) 40     Temp 10/13/17 1725 99.7 F (37.6 C)     Temp Source 10/13/17 1725 Axillary     SpO2 10/13/17 1723 100 %     Weight 10/13/17 1721 175 lb (79.4 kg)     Height 10/13/17 1721 5\' 10"  (1.778 m)     Head Circumference --      Peak Flow --      Pain Score 10/13/17 1721 10     Pain Loc --      Pain Edu? --      Excl. in [place]? --     Constitutional: Very somnolent, arouses to voice.  Able to voice his own name, not able to give a history Eyes: Conjunctivae are normal. Head: Atraumatic. Nose: No congestion/rhinnorhea. Mouth/Throat: Mucous membranes are extremely dry. Neck: No stridor.   Cardiovascular: Tachycardic rate, regular rhythm. Grossly normal heart sounds.  Good peripheral circulation. Respiratory: Very tachypnea, large volume respirations.  [place]-like pattern with an end-tidal CO2 about 10 Gastrointestinal: Soft and nontender. No distention.  Some urinary incontinence noted.  Formed brown stool.  No black or bloody stool. Musculoskeletal: No lower extremity tenderness nor edema. Neurologic: Weak voice, alerts to voice.  Follows very basic commands such as gripping, wiggling fingers and arms.  Able to lift his arms up with about 3+ strength bilaterally.  No focal deficits.  He does have weakness of the left shoulder and it is in a an immobilizer. Skin:  Skin is warm, dry and intact. No rash noted. Psychiatric: Mood and affect are flat, difficult to assess due to altered mental status  ____________________________________________   LABS (all labs ordered are listed, but only abnormal results are displayed)  Labs Reviewed  GLUCOSE, CAPILLARY - Abnormal; Notable for the following components:      Result Value   Glucose-Capillary >600 (*)     All other components within normal limits  LACTIC ACID, PLASMA - Abnormal; Notable for the following components:   Lactic Acid, Venous 9.2 (*)    All other components within normal limits  COMPREHENSIVE METABOLIC PANEL - Abnormal; Notable for the following components:   Sodium 133 (*)    Chloride 96 (*)    CO2 <7 (*)    Glucose, Bld 765 (*)    Creatinine, Ser 1.58 (*)    Calcium 7.8 (*)    Total Protein 5.8 (*)    Albumin 2.6 (*)    ALT <5 (*)    Alkaline Phosphatase 149 (*)    Total Bilirubin 1.9 (*)    GFR calc  non Af Amer 45 (*)    GFR calc Af Amer 52 (*)    All other components within normal limits  TROPONIN I - Abnormal; Notable for the following components:   Troponin I 0.03 (*)    All other components within normal limits  CBC WITH DIFFERENTIAL/PLATELET - Abnormal; Notable for the following components:   WBC 11.2 (*)    RBC 2.85 (*)    Hemoglobin 10.0 (*)    HCT 33.2 (*)    MCV 116.4 (*)    MCH 35.0 (*)    MCHC 30.1 (*)    RDW 16.2 (*)    Platelets 668 (*)    Neutro Abs 7.6 (*)    All other components within normal limits  URINALYSIS, ROUTINE W REFLEX MICROSCOPIC - Abnormal; Notable for the following components:   Color, Urine YELLOW (*)    APPearance CLOUDY (*)    Glucose, UA >=500 (*)    Hgb urine dipstick SMALL (*)    Ketones, ur 20 (*)    Protein, ur 100 (*)    Leukocytes, UA SMALL (*)    WBC, UA >50 (*)    Bacteria, UA RARE (*)    All other components within normal limits  BLOOD GAS, VENOUS - Abnormal; Notable for the following components:   pH, Ven 6.91 (*)    pCO2, Ven 23 (*)    Bicarbonate 4.6 (*)    Acid-base deficit 26.7 (*)    All other components within normal limits  AMMONIA - Abnormal; Notable for the following components:   Ammonia 107 (*)    All other components within normal limits  BASIC METABOLIC PANEL - Abnormal; Notable for the following components:   CO2 <7 (*)    Glucose, Bld 732 (*)    Creatinine, Ser 1.46 (*)    Calcium 6.7 (*)     GFR calc non Af Amer 50 (*)    GFR calc Af Amer 58 (*)    All other components within normal limits  LACTIC ACID, PLASMA - Abnormal; Notable for the following components:   Lactic Acid, Venous 6.8 (*)    All other components within normal limits  GLUCOSE, CAPILLARY - Abnormal; Notable for the following components:   Glucose-Capillary 543 (*)    All other components within normal limits  CULTURE, BLOOD (ROUTINE X 2)  CULTURE, BLOOD (ROUTINE X 2)  MRSA PCR SCREENING  LIPASE, BLOOD  LACTIC ACID, PLASMA  ETHANOL  RPR  URINE DRUGS OF ABUSE SCREEN W ALC, ROUTINE (REF LAB)  CBG MONITORING, ED  CBG MONITORING, ED   ____________________________________________  EKG  Reviewed enterotomy at 1730 Heart rate 130 QRS 90 QTc 420 Sinus tachycardia, nonspecific abnormality.  No evidence of acute ischemia denoted ____________________________________________  RADIOLOGY  Chest x-ray reviewed, no acute ____________________________________________   PROCEDURES  Procedure(s) performed: None  Procedures  Critical Care performed: Yes, see critical care note(s)    ----------------------------------------- 6:45 PM on 10/13/2017 -----------------------------------------  Patient has extended critical care needs.  Reevaluation, patient shows improvement, blood pressure is stabilized now in the 120s, heart rate in the 120s as well.  He shows improvement and signs of his severe hypovolemic shock.  I have ordered a repeat BMP, anticipate initiation of IV insulin infusion now.  Broad-spectrum antibiotic coverage as the patient notably febrile as well.  Updated the patient's wife the bedside the patient's critical status.  Patient is pending admission to the ICU at this time.  Showing signs of improvement, but still  clearly in a very tenuous state with severe acidosis, shock, DKA.  He is continuing to protect his airway well, able to voice his name, follow basic commands at this  time.  CRITICAL CARE Performed by: Delman Kitten   Total critical care time: 95 minutes  Critical care time was exclusive of separately billable procedures and treating other patients.  Critical care was necessary to treat or prevent imminent or life-threatening deterioration.  Critical care was time spent personally by me on the following activities: development of treatment plan with patient and/or surrogate as well as nursing, discussions with consultants, evaluation of patient's response to treatment, examination of patient, obtaining history from patient or surrogate, ordering and performing treatments and interventions, ordering and review of laboratory studies, ordering and review of radiographic studies, pulse oximetry and re-evaluation of patient's condition.  ____________________________________________   INITIAL IMPRESSION / ASSESSMENT AND PLAN / ED COURSE  Pertinent labs & imaging results that were available during my care of the patient were reviewed by me and considered in my medical decision making (see chart for details).  Patient presents for confusion, hypotension, tachycardia, and notably elevated glucose.  Also noted to be febrile.  Patient shows signs of altered mental status, severe elevated respiratory rate with K[place] Mall like breathing.  Suspect the patient is severely acidotic on presentation with signs of severe dehydration and likely hypovolemic shock.  High concern for DKA.  Clinical Course as of Oct 14 1946  Wed Oct 13, 2017  1743 Severe acidosis.    [MQ]  1749 Case discussed with Dr. Jefferson Fuel, he recommends patient be initiated on IV bicarbonate advises administering 1 or 2 Amp IV now and infusion.  Fluid resuscitation. CCM MD will see patient in ICU tonight.    [MQ]    Clinical Course User Index [MQ] Delman Kitten, MD   ----------------------------------------- 5:47 PM on 10/13/2017 -----------------------------------------  This certainly could be very  multifactorial, the patient's initial evaluation in my initial clinical history seem to suggest severe DKA.  Blood sugar greater than 600, pH 6.9.  The patient is [place]-like breathing pattern with tachypnea, taking large minute ventilations.   ----------------------------------------- 6:44 PM on 10/13/2017 -----------------------------------------  ----------------------------------------- 7:09 PM on 10/13/2017 -----------------------------------------  Patient condition seem to be slowly improving mental status slow to improve but does improve.  He is protecting his airway well, currently with his [place] breathing pattern ongoing, await repeat BMP.  Bolus to initiate insulin infusion now. ____________________________________________   FINAL CLINICAL IMPRESSION(S) / ED DIAGNOSES  Final diagnoses:  Severe sepsis ([place])  Diabetic ketoacidosis without coma associated with other specified diabetes mellitus ([place])  Hypovolemic shock ([place])      NEW MEDICATIONS STARTED DURING THIS VISIT:  New Prescriptions   No medications on file     Note:  This document was prepared using Dragon voice recognition software and may include unintentional dictation errors.     Delman Kitten, MD 10/13/17 [phone]

## 2017-10-13 NOTE — ED Notes (Signed)
RT in with pt now

## 2017-10-13 NOTE — Progress Notes (Signed)
CODE SEPSIS - PHARMACY COMMUNICATION  **Broad Spectrum Antibiotics should be administered within 1 hour of Sepsis diagnosis**  Time Code Sepsis Called/Page Received: 1734  Antibiotics Ordered: Levaquin, cefepime, vancomycin, aztreonam  Time of 1st antibiotic administration: 1803  Additional action taken by pharmacy: none required  If necessary, Name of Provider/Nurse Contacted: N/A    Dallie Piles ,PharmD Clinical Pharmacist  10/13/2017  6:11 PM

## 2017-10-13 NOTE — ED Notes (Signed)
Insulin pump and 5 dollar cash bill given to wife.

## 2017-10-13 NOTE — ED Notes (Signed)
2 liters infused   Bags 3 and 4 ns infusing.  Wife at bedside.  Sinus tach on monitor.  Pt eyes open.  nonrebreather in place.  Ox sats 100%

## 2017-10-13 NOTE — ED Triage Notes (Signed)
Pt brought in via ems from home with altered mental status.  Pt has high blood sugar.  Pt lethargic on arrival to er.  md at bedside.

## 2017-10-13 NOTE — ED Notes (Signed)
ED Provider at bedside. 

## 2017-10-13 NOTE — H&P (Addendum)
[facility] [place] NAME: John Reilly    MR#:  [id number]  DATE OF BIRTH:  [birth year]  DATE OF ADMISSION:  10/13/2017  PRIMARY CARE PHYSICIAN: Glendon Axe, MD   REQUESTING/REFERRING PHYSICIAN:   CHIEF COMPLAINT:   Chief Complaint  Patient presents with  . Altered Mental Status    HISTORY OF PRESENT ILLNESS: John Reilly  is a 62 y.o. male with a known history per below presenting with acute altered mental status started around this afternoon, patient noted to be having blood sugars in the afternoon in the 200-300 range, patient was brought to the emergency room via EMS, noted to be combative in route,, placed on nonrebreather, noted urinary incontinence, ER work-up noted for pH 6.9, creatinine 1.5 with baseline normal, white count 11,000, code sepsis called, patient given IV vancomycin/aztreonam, patient evaluated in the emergency room, wife at the bedside, patient is encephalopathic, poor historian, patient noted to be severely dehydrated, patient weaned off oxygen, 100% on room air currently, wife concern for dysfunctional insulin pump, patient is now being admitted for acute DKA with associated encephalopathy which is compounded by chronic Parkinson's with history of dementia and alcohol abuse.  PAST MEDICAL HISTORY:   Past Medical History:  Diagnosis Date  . Cancer ([place])    Pt reports on 10/02/17 that he has never been dx with cancer  . COPD (chronic obstructive pulmonary disease) ([place])   . Diabetes mellitus without complication ([place])   . GERD (gastroesophageal reflux disease)   . Hypertension   . Multiple duodenal ulcers   . Peripheral vascular disease ([facility])    Peripheral Neuropathy  . Retinopathy     PAST SURGICAL HISTORY:  Past Surgical History:  Procedure Laterality Date  . ANKLE ARTHROPLASTY     2013  . COLONOSCOPY WITH PROPOFOL N/A 10/12/2016   Procedure: COLONOSCOPY WITH PROPOFOL;  Surgeon: Manya Silvas, MD;  Location:  [facility] ENDOSCOPY;  Service: Endoscopy;  Laterality: N/A;    SOCIAL HISTORY:  Social History   Tobacco Use  . Smoking status: Former Smoker    Last attempt to quit: 10/21/2005    Years since quitting: 11.9  . Smokeless tobacco: Never Used  Substance Use Topics  . Alcohol use: Yes    Alcohol/week: 25.2 oz    Types: 42 Cans of beer per week    Comment: patient states he drinks 1 quart of beer per day    FAMILY HISTORY:  Family History  Problem Relation Age of Onset  . Stroke Mother   . Alzheimer's disease Father   . Diabetes Brother     DRUG ALLERGIES:  Allergies  Allergen Reactions  . Penicillins Rash    Has patient had a PCN reaction causing immediate rash, facial/tongue/throat swelling, SOB or lightheadedness with hypotension: Yes Has patient had a PCN reaction causing severe rash involving mucus membranes or skin necrosis: No Has patient had a PCN reaction that required hospitalization: No Has patient had a PCN reaction occurring within the last 10 years: No If all of the above answers are "NO", then may proceed with Cephalosporin use.    REVIEW OF SYSTEMS: Unable to be obtained sufficiently given encephalopathy/poor historian, wife at the bedside  CONSTITUTIONAL: No fever, fatigue or weakness.  EYES: No blurred or double vision.  EARS, NOSE, AND THROAT: No tinnitus or ear pain.  RESPIRATORY: No cough, shortness of breath, wheezing or hemoptysis.  CARDIOVASCULAR: No chest pain, orthopnea, edema.  GASTROINTESTINAL: No nausea, vomiting, diarrhea  or abdominal pain.  GENITOURINARY: No dysuria, hematuria.  ENDOCRINE: No polyuria, nocturia,  HEMATOLOGY: No anemia, easy bruising or bleeding SKIN: No rash or lesion. MUSCULOSKELETAL: No joint pain or arthritis.   NEUROLOGIC: No tingling, numbness, weakness.  PSYCHIATRY: No anxiety or depression.   MEDICATIONS AT HOME:  Prior to Admission medications   Medication Sig Start Date End Date Taking? Authorizing Provider  B  Complex-Biotin-FA (SUPER B-COMPLEX) TABS Take 1 tablet by mouth daily.   Yes [provider]  carbidopa-levodopa (SINEMET CR) 50-200 MG tablet Take 1 tablet by mouth at bedtime. 07/22/17  Yes [provider]  carbidopa-levodopa (SINEMET IR) 25-100 MG tablet Take 2 tablets by mouth 3 (three) times daily.  04/22/17 10/19/17 Yes [provider]  folic acid (FOLVITE) 1 MG tablet Take 1 mg by mouth daily.   Yes [provider]  glucagon 1 MG injection 1 mg by Other route once as needed (for severe hypoglycemia).    Yes [provider]  Insulin Human (INSULIN PUMP) SOLN Pt uses Humalog.   Yes [provider]  metoprolol succinate (TOPROL-XL) 100 MG 24 hr tablet Take 100 mg by mouth daily.    Yes [provider]  Multiple Vitamin (MULTIVITAMIN WITH MINERALS) TABS tablet Take 1 tablet by mouth daily.   Yes [provider]  omeprazole (PRILOSEC) 40 MG capsule Take 40 mg by mouth daily.   Yes [provider]  acetaminophen (TYLENOL) 325 MG tablet Take 2 tablets (650 mg total) by mouth every 4 (four) hours as needed for moderate pain (or Fever >/= 101). 10/04/17   Max Sane, MD  amLODipine (NORVASC) 5 MG tablet Take 1 tablet by mouth daily as needed (BP OVER 160/90).  07/22/17   [provider]      PHYSICAL EXAMINATION:   VITAL SIGNS: Blood pressure (!) 101/55, pulse (!) 134, temperature (!) 100.7 F (38.2 C), resp. rate (!) 41, height 5\' 10"  (1.778 m), weight 79.4 kg (175 lb), SpO2 100 %.  GENERAL:  62 y.o.-year-old patient lying in the bed with no acute distress.  Frail-appearing eYES: Pupils equal, round, reactive to light and accommodation. No scleral icterus. Extraocular muscles intact.  HEENT: Head atraumatic, normocephalic. Oropharynx and nasopharynx clear.  Dry mucous membranes NECK:  Supple, no jugular venous distention. No thyroid enlargement, no tenderness.  Poor skin turgor LUNGS: Normal breath sounds  bilaterally, no wheezing, rales,rhonchi or crepitation. No use of accessory muscles of respiration.  CARDIOVASCULAR: S1, S2 normal. No murmurs, rubs, or gallops.  ABDOMEN: Soft, nontender, nondistended. Bowel sounds present. No organomegaly or mass.  EXTREMITIES: No pedal edema, cyanosis, or clubbing.  Diffuse muscular wasting NEUROLOGIC: Cranial nerves II through XII are intact. MAES. Gait not checked.   PSYCHIATRIC: The patient is alert, alert, confused, disoriented  SKIN: No obvious rash, lesion, or ulcer.   LABORATORY PANEL:   CBC Recent Labs  Lab 10/13/17 1729  WBC 11.2*  HGB 10.0*  HCT 33.2*  PLT 668*  MCV 116.4*  MCH 35.0*  MCHC 30.1*  RDW 16.2*  LYMPHSABS 2.5  MONOABS 1.0  EOSABS 0.0  BASOSABS 0.1   ------------------------------------------------------------------------------------------------------------------  Chemistries  Recent Labs  Lab 10/13/17 1729  NA 133*  K 4.1  CL 96*  CO2 <7*  GLUCOSE 765*  BUN 12  CREATININE 1.58*  CALCIUM 7.8*  AST 32  ALT <5*  ALKPHOS 149*  BILITOT 1.9*   ------------------------------------------------------------------------------------------------------------------ estimated creatinine clearance is 50.1 mL/min (A) (by C-G formula based on SCr of 1.58  mg/dL (H)). ------------------------------------------------------------------------------------------------------------------ No results for input(s): TSH, T4TOTAL, T3FREE, THYROIDAB in the last 72 hours.  Invalid input(s): FREET3   Coagulation profile No results for input(s): INR, PROTIME in the last 168 hours. ------------------------------------------------------------------------------------------------------------------- No results for input(s): DDIMER in the last 72 hours. -------------------------------------------------------------------------------------------------------------------  Cardiac Enzymes Recent Labs  Lab 10/13/17 1729  TROPONINI 0.03*    ------------------------------------------------------------------------------------------------------------------ Invalid input(s): POCBNP  ---------------------------------------------------------------------------------------------------------------  Urinalysis    Component Value Date/Time   COLORURINE YELLOW (A) 10/02/2017 2343   APPEARANCEUR CLEAR (A) 10/02/2017 2343   LABSPEC 1.005 10/02/2017 2343   PHURINE 6.0 10/02/2017 2343   GLUCOSEU >=500 (A) 10/02/2017 2343   HGBUR NEGATIVE 10/02/2017 2343   BILIRUBINUR NEGATIVE 10/02/2017 2343   KETONESUR 5 (A) 10/02/2017 2343   PROTEINUR NEGATIVE 10/02/2017 2343   NITRITE NEGATIVE 10/02/2017 2343   LEUKOCYTESUR NEGATIVE 10/02/2017 2343     RADIOLOGY: Dg Chest Port 1 View  Result Date: 10/13/2017 CLINICAL DATA:  Altered mental status. EXAM: PORTABLE CHEST 1 VIEW COMPARISON:  10/02/2017 FINDINGS: Minimal scarring at the left base. No acute airspace opacities or effusions. Heart is normal size. No acute bony abnormality. IMPRESSION: No active disease. Electronically Signed   By: Rolm Baptise M.D.   On: 10/13/2017 18:12    EKG: Orders placed or performed during the hospital encounter of 10/13/17  . EKG 12-Lead  . EKG 12-Lead  . ED EKG 12-Lead  . 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  Dysfunctional insulin pump *Recent left shoulder fracture status post fall  *Recent left rib fracture s/p fall  Discussed with intensivist and ED attending, admit to stepdown unit, DKA protocol, avoid any further antibiotics as patient is without clinical evidence of infection, follow-up on cultures, check procalcitonin, IV fluids for rehydration, cycle  cardiac enzymes to rule out acute coronary syndrome, check ammonia level, RPR, urine drug screen, aspiration/fall precautions, alcohol withdrawal protocol, and consider evaluation of insulin pump for effectiveness/proper functioning.    All the records are reviewed and case discussed with ED provider. Management plans discussed with the patient, family and they are in agreement.  CODE STATUS:full Code Status History    Date Active Date Inactive Code Status Order ID Comments User Context   10/03/2017 0209 10/04/2017 1452 Full Code [id number]  Amelia Jo, MD Inpatient   03/19/2015 1939 03/21/2015 1928 Full Code [id number]  Bettey Costa, MD Inpatient   03/15/2015 2114 03/16/2015 1837 Full Code [id number]  Hower, Aaron Mose, MD ED       TOTAL TIME TAKING CARE OF THIS PATIENT: 45 minutes.    Avel Peace Renton Berkley M.D on 10/13/2017   Between 7am to 6pm - Pager - [phone]  After 6pm go to [email or web address] - password [email or web address] [facility]  Office  [phone]  CC: Primary care physician; Glendon Axe, MD   Note: This dictation was prepared with Dragon dictation along with smaller phrase technology. Any transcriptional errors that result from this process are unintentional.

## 2017-10-13 NOTE — ED Notes (Signed)
Pt has left shoulder fx and left rib fx from a fall last week.  Pt was combative en route to the hospital per ems.  Pt on non rebreather on arrival.  Pt tachypenic  Iv started stat   Pt is diabetic... cbg HIGH in er.

## 2017-10-14 DIAGNOSIS — E1110 Type 2 diabetes mellitus with ketoacidosis without coma: Secondary | ICD-10-CM

## 2017-10-14 LAB — GLUCOSE, CAPILLARY
GLUCOSE-CAPILLARY: 117 mg/dL — AB (ref 65–99)
GLUCOSE-CAPILLARY: 181 mg/dL — AB (ref 65–99)
GLUCOSE-CAPILLARY: 299 mg/dL — AB (ref 65–99)
GLUCOSE-CAPILLARY: 40 mg/dL — AB (ref 65–99)
GLUCOSE-CAPILLARY: 415 mg/dL — AB (ref 65–99)
GLUCOSE-CAPILLARY: 88 mg/dL (ref 65–99)
GLUCOSE-CAPILLARY: 98 mg/dL (ref 65–99)
GLUCOSE-CAPILLARY: 98 mg/dL (ref 65–99)
Glucose-Capillary: 263 mg/dL — ABNORMAL HIGH (ref 65–99)
Glucose-Capillary: 213 mg/dL — ABNORMAL HIGH (ref 65–99)
Glucose-Capillary: 161 mg/dL — ABNORMAL HIGH (ref 65–99)
Glucose-Capillary: 116 mg/dL — ABNORMAL HIGH (ref 65–99)
Glucose-Capillary: 102 mg/dL — ABNORMAL HIGH (ref 65–99)
Glucose-Capillary: 115 mg/dL — ABNORMAL HIGH (ref 65–99)
Glucose-Capillary: 72 mg/dL (ref 65–99)

## 2017-10-14 LAB — BLOOD GAS, ARTERIAL
Acid-Base Excess: 0.5 mmol/L (ref 0.0–2.0)
Bicarbonate: 22.4 mmol/L (ref 20.0–28.0)
FIO2: 0.21
O2 SAT: 98.9 %
PATIENT TEMPERATURE: 37
pCO2 arterial: 25 mmHg — ABNORMAL LOW (ref 32.0–48.0)
pH, Arterial: 7.56 — ABNORMAL HIGH (ref 7.350–7.450)
pO2, Arterial: 111 mmHg — ABNORMAL HIGH (ref 83.0–108.0)

## 2017-10-14 LAB — CBC WITH DIFFERENTIAL/PLATELET
Basophils Absolute: 0 10*3/uL (ref 0–0.1)
Basophils Relative: 0 %
Eosinophils Absolute: 0 10*3/uL (ref 0–0.7)
Eosinophils Relative: 0 %
HEMATOCRIT: 21.4 % — AB (ref 40.0–52.0)
Hemoglobin: 7.3 g/dL — ABNORMAL LOW (ref 13.0–18.0)
LYMPHS PCT: 12 %
Lymphs Abs: 1.3 10*3/uL (ref 1.0–3.6)
MCH: 34.5 pg — AB (ref 26.0–34.0)
MCHC: 34 g/dL (ref 32.0–36.0)
MCV: 101.3 fL — AB (ref 80.0–100.0)
MONO ABS: 0.8 10*3/uL (ref 0.2–1.0)
MONOS PCT: 8 %
NEUTROS ABS: 8.9 10*3/uL — AB (ref 1.4–6.5)
Neutrophils Relative %: 80 %
Platelets: 434 10*3/uL (ref 150–440)
RBC: 2.12 MIL/uL — ABNORMAL LOW (ref 4.40–5.90)
RDW: 14.5 % (ref 11.5–14.5)
WBC: 11 10*3/uL — ABNORMAL HIGH (ref 3.8–10.6)

## 2017-10-14 LAB — PROCALCITONIN: PROCALCITONIN: 1.46 ng/mL

## 2017-10-14 LAB — BASIC METABOLIC PANEL
ANION GAP: 7 (ref 5–15)
ANION GAP: 7 (ref 5–15)
Anion gap: 16 — ABNORMAL HIGH (ref 5–15)
Anion gap: 6 (ref 5–15)
BUN: 11 mg/dL (ref 6–20)
BUN: 10 mg/dL (ref 6–20)
BUN: 9 mg/dL (ref 6–20)
BUN: 8 mg/dL (ref 6–20)
CALCIUM: 6.5 mg/dL — AB (ref 8.9–10.3)
CALCIUM: 6.4 mg/dL — AB (ref 8.9–10.3)
CALCIUM: 6.9 mg/dL — AB (ref 8.9–10.3)
CALCIUM: 6.9 mg/dL — AB (ref 8.9–10.3)
CHLORIDE: 112 mmol/L — AB (ref 101–111)
CO2: 12 mmol/L — AB (ref 22–32)
CO2: 21 mmol/L — AB (ref 22–32)
CO2: 21 mmol/L — AB (ref 22–32)
CO2: 21 mmol/L — AB (ref 22–32)
CREATININE: 1.2 mg/dL (ref 0.61–1.24)
CREATININE: 0.9 mg/dL (ref 0.61–1.24)
CREATININE: 0.88 mg/dL (ref 0.61–1.24)
Chloride: 113 mmol/L — ABNORMAL HIGH (ref 101–111)
Chloride: 112 mmol/L — ABNORMAL HIGH (ref 101–111)
Chloride: 112 mmol/L — ABNORMAL HIGH (ref 101–111)
Creatinine, Ser: 0.8 mg/dL (ref 0.61–1.24)
GFR calc Af Amer: >60 mL/min (ref >60)
GFR calc Af Amer: >60 mL/min (ref >60)
GFR calc Af Amer: >60 mL/min (ref >60)
GFR calc Af Amer: >60 mL/min (ref >60)
GFR calc non Af Amer: >60 mL/min (ref >60)
GFR calc non Af Amer: >60 mL/min (ref >60)
GFR calc non Af Amer: >60 mL/min (ref >60)
GFR calc non Af Amer: >60 mL/min (ref >60)
GLUCOSE: 102 mg/dL — AB (ref 65–99)
GLUCOSE: 98 mg/dL (ref 65–99)
GLUCOSE: 63 mg/dL — AB (ref 65–99)
Glucose, Bld: 243 mg/dL — ABNORMAL HIGH (ref 65–99)
Potassium: 2.6 mmol/L — CL (ref 3.5–5.1)
Potassium: 2.4 mmol/L — CL (ref 3.5–5.1)
Potassium: 2.9 mmol/L — ABNORMAL LOW (ref 3.5–5.1)
Potassium: 2.8 mmol/L — ABNORMAL LOW (ref 3.5–5.1)
SODIUM: 140 mmol/L (ref 135–145)
Sodium: 140 mmol/L (ref 135–145)
Sodium: 140 mmol/L (ref 135–145)
Sodium: 140 mmol/L (ref 135–145)

## 2017-10-14 LAB — LACTIC ACID, PLASMA: LACTIC ACID, VENOUS: 4.2 mmol/L — AB (ref 0.5–1.9)

## 2017-10-14 LAB — URINE DRUG SCREEN, QUALITATIVE (ARMC ONLY)
AMPHETAMINES, UR SCREEN: NOT DETECTED
BENZODIAZEPINE, UR SCRN: NOT DETECTED
Barbiturates, Ur Screen: NOT DETECTED
Cannabinoid 50 Ng, Ur ~~LOC~~: NOT DETECTED
Cocaine Metabolite,Ur ~~LOC~~: NOT DETECTED
MDMA (Ecstasy)Ur Screen: NOT DETECTED
Methadone Scn, Ur: NOT DETECTED
OPIATE, UR SCREEN: POSITIVE — AB
PHENCYCLIDINE (PCP) UR S: NOT DETECTED
Tricyclic, Ur Screen: NOT DETECTED

## 2017-10-14 LAB — FOLATE: Folate: 30 ng/mL (ref >5.9)

## 2017-10-14 LAB — PHOSPHORUS
PHOSPHORUS: 1.3 mg/dL — AB (ref 2.5–4.6)
Phosphorus: 1.4 mg/dL — ABNORMAL LOW (ref 2.5–4.6)

## 2017-10-14 LAB — MAGNESIUM
Magnesium: 0.9 mg/dL — CL (ref 1.7–2.4)
Magnesium: 1.6 mg/dL — ABNORMAL LOW (ref 1.7–2.4)

## 2017-10-14 LAB — RETICULOCYTES
RBC.: 2.17 MIL/uL — ABNORMAL LOW (ref 4.40–5.90)
RETIC COUNT ABSOLUTE: 23.9 10*3/uL (ref 19.0–183.0)
RETIC CT PCT: 1.1 % (ref 0.4–3.1)

## 2017-10-14 LAB — TROPONIN I: Troponin I: 0.2 ng/mL (ref <0.03)

## 2017-10-14 LAB — VITAMIN B12: Vitamin B-12: 734 pg/mL (ref 180–914)

## 2017-10-14 LAB — FERRITIN: FERRITIN: 344 ng/mL — AB (ref 24–336)

## 2017-10-14 MED ORDER — POTASSIUM PHOSPHATES 15 MMOLE/5ML IV SOLN
30.0000 mmol | Freq: Once | INTRAVENOUS | Status: DC
  Filled 2017-10-14: qty 10

## 2017-10-14 MED ORDER — INSULIN ASPART 100 UNIT/ML ~~LOC~~ SOLN
0.0000 [IU] | Freq: Three times a day (TID) | SUBCUTANEOUS | Status: DC
  Administered 2017-10-15: 1 [IU] via SUBCUTANEOUS
  Administered 2017-10-15: 3 [IU] via SUBCUTANEOUS
  Administered 2017-10-15: 2 [IU] via SUBCUTANEOUS
  Administered 2017-10-16: 3 [IU] via SUBCUTANEOUS
  Filled 2017-10-14 (×3): qty 1

## 2017-10-14 MED ORDER — POTASSIUM PHOSPHATES 15 MMOLE/5ML IV SOLN
45.0000 mmol | Freq: Once | INTRAVENOUS | Status: AC
  Administered 2017-10-14: 45 mmol via INTRAVENOUS
  Filled 2017-10-14: qty 15

## 2017-10-14 MED ORDER — VANCOMYCIN HCL IN DEXTROSE 1-5 GM/200ML-% IV SOLN
1000.0000 mg | Freq: Two times a day (BID) | INTRAVENOUS | Status: DC
  Filled 2017-10-14 (×2): qty 200

## 2017-10-14 MED ORDER — POTASSIUM CHLORIDE 10 MEQ/100ML IV SOLN
10.0000 meq | INTRAVENOUS | Status: AC
  Administered 2017-10-14 (×4): 10 meq via INTRAVENOUS
  Filled 2017-10-14 (×6): qty 100

## 2017-10-14 MED ORDER — MAGNESIUM SULFATE 4 GM/100ML IV SOLN
4.0000 g | Freq: Once | INTRAVENOUS | Status: AC
  Administered 2017-10-14: 4 g via INTRAVENOUS
  Filled 2017-10-14: qty 100

## 2017-10-14 MED ORDER — STERILE WATER FOR INJECTION IV SOLN
INTRAVENOUS | Status: DC
  Administered 2017-10-14: 07:00:00 via INTRAVENOUS
  Filled 2017-10-14 (×2): qty 850

## 2017-10-14 MED ORDER — PNEUMOCOCCAL VAC POLYVALENT 25 MCG/0.5ML IJ INJ
0.5000 mL | INJECTION | INTRAMUSCULAR | Status: AC
Start: 2017-10-15 — End: 2017-10-15
  Administered 2017-10-15: 0.5 mL via INTRAMUSCULAR
  Filled 2017-10-14: qty 0.5

## 2017-10-14 MED ORDER — POTASSIUM CHLORIDE 2 MEQ/ML IV SOLN
INTRAVENOUS | Status: DC
  Administered 2017-10-14 – 2017-10-16 (×6): via INTRAVENOUS
  Filled 2017-10-14 (×9): qty 1000

## 2017-10-14 MED ORDER — KCL IN DEXTROSE-NACL 40-5-0.45 MEQ/L-%-% IV SOLN
INTRAVENOUS | Status: DC
  Administered 2017-10-14: 09:00:00 via INTRAVENOUS
  Filled 2017-10-14 (×2): qty 1000

## 2017-10-14 MED ORDER — INSULIN ASPART 100 UNIT/ML ~~LOC~~ SOLN
3.0000 [IU] | Freq: Three times a day (TID) | SUBCUTANEOUS | Status: DC
Start: 2017-10-14 — End: 2017-10-16
  Administered 2017-10-14 – 2017-10-15 (×4): 3 [IU] via SUBCUTANEOUS
  Filled 2017-10-14 (×4): qty 1

## 2017-10-14 MED ORDER — INSULIN ASPART 100 UNIT/ML ~~LOC~~ SOLN: 0.0000 [IU] | Freq: Every day | SUBCUTANEOUS | Status: DC

## 2017-10-14 MED ORDER — INSULIN GLARGINE 100 UNIT/ML ~~LOC~~ SOLN
15.0000 [IU] | Freq: Every day | SUBCUTANEOUS | Status: DC
  Administered 2017-10-14 – 2017-10-15 (×2): 15 [IU] via SUBCUTANEOUS
  Filled 2017-10-14 (×3): qty 0.15

## 2017-10-14 MED ORDER — SODIUM CHLORIDE 0.9 % IV SOLN
1.0000 g | Freq: Two times a day (BID) | INTRAVENOUS | Status: DC
  Administered 2017-10-14: 1 g via INTRAVENOUS
  Filled 2017-10-14 (×2): qty 1

## 2017-10-14 MED ORDER — LEVOFLOXACIN IN D5W 750 MG/150ML IV SOLN
750.0000 mg | INTRAVENOUS | Status: DC
  Filled 2017-10-14: qty 150

## 2017-10-14 MED ORDER — MAGNESIUM SULFATE 2 GM/50ML IV SOLN
2.0000 g | Freq: Once | INTRAVENOUS | Status: AC
  Administered 2017-10-14: 2 g via INTRAVENOUS
  Filled 2017-10-14: qty 50

## 2017-10-14 NOTE — Progress Notes (Signed)
Transferred from CCU.  Alert and oriented.  Speech and responses are slow.  Wife says this is his norm.  Foley catheter placed due to electrolyte imbalance and AMS.  DC'd upon transfer.

## 2017-10-14 NOTE — Progress Notes (Signed)
Report given to [facility] RN for patient to be transferred to room 250 with all personal belonging including his Insulin pump. Patient wanted staff to notify wife of transfer and message was left with wife.

## 2017-10-14 NOTE — Progress Notes (Signed)
Pharmacy Electrolyte Monitoring Consult:  Pharmacy consulted to assist in monitoring and replacing electrolytes in this 62 y.o. male admitted on 10/13/2017 with DKA.   Labs:  Sodium (mmol/L)  Date Value  10/14/2017 140   Potassium (mmol/L)  Date Value  10/14/2017 2.8 (L)   Magnesium (mg/dL)  Date Value  10/14/2017 1.6 (L)   Phosphorus (mg/dL)  Date Value  10/14/2017 1.4 (L)   Calcium (mg/dL)  Date Value  10/14/2017 6.9 (L)   Albumin (g/dL)  Date Value  10/13/2017 2.6 (L)    Assessment/Plan:  Patient did not receive potassium phosphate this am - discontinued to error.  Potassium phosphate 83mmol IV x 1.   Magnesium 2g IV Q4hr.   Will recheck electrolytes at 0700.   Pharmacy will continue to monitor and adjust per consult.   Murvin Gift L 10/14/2017 8:09 PM

## 2017-10-14 NOTE — Progress Notes (Signed)
[place] at [facility]                                                                                                                                                                                  Patient Demographics   Joseph Bias, is a 62 y.o. male, DOB - [birth year], ELF:[id number]  Admit date - 10/13/2017   Admitting Physician Gorden Harms, MD  Outpatient Primary MD for the patient is Glendon Axe, MD   LOS - 1  Subjective: Patient complains of coughing, feeling better    Review of Systems:   CONSTITUTIONAL: No documented fever. No fatigue, weakness. No weight gain, no weight loss.  EYES: No blurry or double vision.  ENT: No tinnitus. No postnasal drip. No redness of the oropharynx.  RESPIRATORY: Positive cough, no wheeze, no hemoptysis. No dyspnea.  CARDIOVASCULAR: No chest pain. No orthopnea. No palpitations. No syncope.  GASTROINTESTINAL: No nausea, no vomiting or diarrhea. No abdominal pain. No melena or hematochezia.  GENITOURINARY: No dysuria or hematuria.  ENDOCRINE: No polyuria or nocturia. No heat or cold intolerance.  HEMATOLOGY: No anemia. No bruising. No bleeding.  INTEGUMENTARY: No rashes. No lesions.  MUSCULOSKELETAL: No arthritis. No swelling. No gout.  NEUROLOGIC: No numbness, tingling, or ataxia. No seizure-type activity.  PSYCHIATRIC: No anxiety. No insomnia. No ADD.    Vitals:   Vitals:   10/14/17 1200 10/14/17 1300 10/14/17 1400 10/14/17 1500  BP: 116/64 132/64 120/64 118/63  Pulse: 93 (!) 107 90 82  Resp: (!) 26 (!) 25 (!) 31 (!) 29  Temp:      TempSrc:      SpO2: 100% 100% 100% 100%  Weight:      Height:        Wt Readings from Last 3 Encounters:  10/13/17 73.9 kg (162 lb 14.7 oz)  10/04/17 73 kg (161 lb)  10/12/16 85.7 kg (189 lb)     Intake/Output Summary (Last 24 hours) at 10/14/2017 1525 Last data filed at 10/14/2017 1500 Gross per 24 hour  Intake 2254.93 ml  Output 1 ml  Net 2253.93 ml     Physical Exam:   GENERAL: Pleasant-appearing in no apparent distress.  HEAD, EYES, EARS, NOSE AND THROAT: Atraumatic, normocephalic. Extraocular muscles are intact. Pupils equal and reactive to light. Sclerae anicteric. No conjunctival injection. No oro-pharyngeal erythema.  NECK: Supple. There is no jugular venous distention. No bruits, no lymphadenopathy, no thyromegaly.  HEART: Regular rate and rhythm,. No murmurs, no rubs, no clicks.  LUNGS: Rhonchus breath sounds bilaterally aBDOMEN: Soft, flat, nontender, nondistended. Has good bowel sounds. No hepatosplenomegaly appreciated.  EXTREMITIES: No evidence of any cyanosis, clubbing, or peripheral edema.  +  2 pedal and radial pulses bilaterally.  NEUROLOGIC: The patient is alert, awake, and oriented x3 with no focal motor or sensory deficits appreciated bilaterally.  SKIN: Moist and warm with no rashes appreciated.  Psych: Not anxious, depressed LN: No inguinal LN enlargement    Antibiotics   Anti-infectives (From admission, onward)   Start     Dose/Rate Route Frequency Ordered Stop   10/14/17 1800  levofloxacin (LEVAQUIN) IVPB 750 mg  Status:  Discontinued     750 mg 100 mL/hr over 90 Minutes Intravenous Every 24 hours 10/14/17 0339 10/14/17 1004   10/14/17 1000  vancomycin (VANCOCIN) IVPB 1000 mg/200 mL premix  Status:  Discontinued     1,000 mg 200 mL/hr over 60 Minutes Intravenous Every 12 hours 10/14/17 0348 10/14/17 1004   10/14/17 0600  aztreonam (AZACTAM) 1 g in sodium chloride 0.9 % 100 mL IVPB  Status:  Discontinued     1 g 200 mL/hr over 30 Minutes Intravenous Every 12 hours 10/14/17 0339 10/14/17 1004   10/13/17 1800  levofloxacin (LEVAQUIN) IVPB 750 mg     750 mg 100 mL/hr over 90 Minutes Intravenous  Once 10/13/17 1752 10/13/17 1935   10/13/17 1800  aztreonam (AZACTAM) 2 g in sodium chloride 0.9 % 100 mL IVPB     2 g 200 mL/hr over 30 Minutes Intravenous  Once 10/13/17 1752 10/13/17 1837   10/13/17 1800  vancomycin  (VANCOCIN) IVPB 1000 mg/200 mL premix     1,000 mg 200 mL/hr over 60 Minutes Intravenous  Once 10/13/17 1752 10/14/17 0439      Medications   Scheduled Meds: . carbidopa-levodopa  1 tablet Oral QHS  . carbidopa-levodopa  2 tablet Oral TID PC  . folic acid  1 mg Oral Daily  . heparin  5,000 Units Subcutaneous Q8H  . insulin aspart  0-5 Units Subcutaneous QHS  . insulin aspart  0-9 Units Subcutaneous TID WC  . insulin aspart  3 Units Subcutaneous TID WC  . insulin glargine  15 Units Subcutaneous Daily  . multivitamin with minerals  1 tablet Oral Daily  . pantoprazole  40 mg Oral Daily  . thiamine  100 mg Oral Daily   Or  . thiamine  100 mg Intravenous Daily   Continuous Infusions: . lactated ringers with kcl 100 mL/hr at 10/14/17 1248   PRN Meds:.acetaminophen, amLODipine, LORazepam **OR** LORazepam   Data Review:   Micro Results Recent Results (from the past 240 hour(s))  Blood Culture (routine x 2)     Status: None (Preliminary result)   Collection Time: 10/13/17  5:29 PM  Result Value Ref Range Status   Specimen Description BLOOD RIGHT ANTECUBITAL  Final   Special Requests   Final    BOTTLES DRAWN AEROBIC AND ANAEROBIC Blood Culture adequate volume   Culture   Final    NO GROWTH < 12 HOURS Performed at [facility], [address] [place], [place] [address]    Report Status PENDING  Incomplete  Blood Culture (routine x 2)     Status: None (Preliminary result)   Collection Time: 10/13/17  5:34 PM  Result Value Ref Range Status   Specimen Description BLOOD BLOOD RIGHT FOREARM  Final   Special Requests   Final    BOTTLES DRAWN AEROBIC AND ANAEROBIC Blood Culture adequate volume   Culture   Final    NO GROWTH < 12 HOURS Performed at [facility], [address] [place], [place] [address]    Report  Status PENDING  Incomplete  MRSA PCR Screening     Status: Abnormal   Collection Time: 10/13/17  8:00 PM  Result Value Ref Range Status   MRSA by  PCR POSITIVE (A) NEGATIVE Final    Comment:        The GeneXpert MRSA Assay (FDA approved for NASAL specimens only), is one component of a comprehensive MRSA colonization surveillance program. It is not intended to diagnose MRSA infection nor to guide or monitor treatment for MRSA infections. RESULT CALLED TO, READ BACK BY AND VERIFIED WITH: DELL HOPKINS ON 10/13/17 AT 2228 JAG Performed at [facility], [address] [place], [place] [address]     Radiology Reports Dg Chest 1 View  Result Date: 10/02/2017 CLINICAL DATA:  Generalized weakness, recent history of fall EXAM: CHEST  1 VIEW COMPARISON:  03/19/2015 FINDINGS: No focal consolidation or effusion. Scarring at the left lung base. Stable cardiomediastinal silhouette. No pneumothorax. Old left-sided rib fracture. Suspected acute displaced right second rib fracture. Mildly displaced fracture involving the proximal shaft of the left humerus. IMPRESSION: 1. Mild scarring at the left lung base.  No acute opacity 2. Acute mildly displaced fracture proximal shaft of the left humerus. Acute appearing displaced right second rib fracture. Electronically Signed   By: Donavan Foil M.D.   On: 10/02/2017 23:50   Ct Head Wo Contrast  Result Date: 10/03/2017 CLINICAL DATA:  Fall with bruising to the left eye EXAM: CT HEAD WITHOUT CONTRAST TECHNIQUE: Contiguous axial images were obtained from the base of the skull through the vertex without intravenous contrast. COMPARISON:  MRI 12/16/2016 FINDINGS: Brain: No acute territorial infarction, hemorrhage or intracranial mass is visualized. Moderate atrophy. Slight asymmetric enlargement of left anterior convexity extra-axial CSF density. Mild small vessel ischemic changes of the white matter. Nonenlarged ventricles. Vascular: No hyperdense vessels. Scattered calcifications at the carotid siphon Skull: Normal. Negative for fracture or focal lesion. Sinuses/Orbits: No acute finding. Other: Mild  left periorbital soft tissue swelling IMPRESSION: 1. No CT evidence for acute intracranial abnormality. 2. Atrophy and mild small vessel ischemic changes of the white matter 3. 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 Electronically Signed   By: Donavan Foil M.D.   On: 10/03/2017 00:04   Dg Chest Port 1 View  Result Date: 10/13/2017 CLINICAL DATA:  Altered mental status. EXAM: PORTABLE CHEST 1 VIEW COMPARISON:  10/02/2017 FINDINGS: Minimal scarring at the left base. No acute airspace opacities or effusions. Heart is normal size. No acute bony abnormality. IMPRESSION: No active disease. Electronically Signed   By: Rolm Baptise M.D.   On: 10/13/2017 18:12   Dg Shoulder Left  Result Date: 10/02/2017 CLINICAL DATA:  Generalized weakness since this morning. Patient fell yesterday and hit the left shoulder. Limited movement. EXAM: LEFT SHOULDER - 2+ VIEW COMPARISON:  None. FINDINGS: Comminuted fractures of the left humeral head and neck with transverse fracture line across the surgical neck and vertical fracture lines extending to the greater tuberosity. There is impaction of fracture fragments with lateral angulation of the distal humeral fracture fragment. No dislocation at the shoulder joint. Multiple tiny displaced fragments are visualized. Visualized clavicle, scapula, and acromion appear intact. IMPRESSION: Comminuted fractures of the left humeral head and neck with impaction of fracture fragments and lateral angulation of the distal fracture fragment. Electronically Signed   By: Lucienne Capers M.D.   On: 10/02/2017 23:47     CBC Recent Labs  Lab 10/13/17  1729 10/13/17 2013 10/14/17 0804  WBC 11.2* 13.0* 11.0*  HGB 10.0* 8.6* 7.3*  HCT 33.2* 27.5* 21.4*  PLT 668* 480* 434  MCV 116.4* 113.8* 101.3*  MCH 35.0* 35.7* 34.5*  MCHC 30.1* 31.4* 34.0  RDW 16.2* 15.6* 14.5  LYMPHSABS 2.5  --  1.3  MONOABS 1.0  --   0.8  EOSABS 0.0  --  0.0  BASOSABS 0.1  --  0.0    Chemistries  Recent Labs  Lab 10/13/17 1729 10/13/17 1841 10/13/17 2013 10/14/17 0244 10/14/17 0804  NA 133* 136 137 140 140  K 4.1 4.0 3.9 2.6* 2.4*  CL 96* 103 104 112* 113*  CO2 <7* <7* <7* 12* 21*  GLUCOSE 765* 732* 681* 243* 102*  BUN 12 13 12 11 10   CREATININE 1.58* 1.46* 1.35* 1.20 0.90  CALCIUM 7.8* 6.7* 6.8* 6.5* 6.4*  MG  --   --   --   --  0.9*  AST 32  --   --   --   --   ALT <5*  --   --   --   --   ALKPHOS 149*  --   --   --   --   BILITOT 1.9*  --   --   --   --    ------------------------------------------------------------------------------------------------------------------ estimated creatinine clearance is 87.9 mL/min (by C-G formula based on SCr of 0.9 mg/dL). ------------------------------------------------------------------------------------------------------------------ Recent Labs    10/13/17 2013  HGBA1C 7.0*   ------------------------------------------------------------------------------------------------------------------ No results for input(s): CHOL, HDL, LDLCALC, TRIG, CHOLHDL, LDLDIRECT in the last 72 hours. ------------------------------------------------------------------------------------------------------------------ No results for input(s): TSH, T4TOTAL, T3FREE, THYROIDAB in the last 72 hours.  Invalid input(s): FREET3 ------------------------------------------------------------------------------------------------------------------ No results for input(s): VITAMINB12, FOLATE, FERRITIN, TIBC, IRON, RETICCTPCT in the last 72 hours.  Coagulation profile No results for input(s): INR, PROTIME in the last 168 hours.  No results for input(s): DDIMER in the last 72 hours.  Cardiac Enzymes Recent Labs  Lab 10/13/17 1729 10/13/17 2013 10/14/17 0244  TROPONINI 0.03* 0.08* 0.20*    ------------------------------------------------------------------------------------------------------------------ Invalid input(s): POCBNP    Assessment & Plan  Patient 62 year old admitted with DKA  #1 acute DKA Continue IV insulin drip and IV fluids Switch to insulin once anion gap normalized  #2 anemia We will order a anemia panel Transfuse if needed if hemoglobin less than 7  #3Acute encephalopathy most likely secondary to above, compounded by Parkinson's disease with dementia and heavy alcohol abuse improved  #4 cough with elevated procalcitonin I discussed the case with intensivist he stated that if patient starts having fever they will start on antibiotics  #5 acute kidney injury due to dehydration follow renal function  #6 chronic heavy alcohol abuse with chronic hyponatremia monitor for withdrawal  #7 hypokalemia replace potassium  #8 miscellaneous Lovenox for DVT prophylaxis     Code Status Orders  (From admission, onward)        Start     Ordered   10/13/17 1956  Full code  Continuous     10/13/17 1955    Code Status History    Date Active Date Inactive Code Status Order ID Comments User Context   10/03/2017 0209 10/04/2017 1452 Full Code [id number]  Amelia Jo, MD Inpatient   03/19/2015 1939 03/21/2015 1928 Full Code [id number]  Bettey Costa, MD Inpatient   03/15/2015 2114 03/16/2015 1837 Full Code [id number]  Hower, Aaron Mose, MD ED           Consults tensive  DVT Prophylaxis  Lovenox  Lab Results  Component Value Date   PLT 434 10/14/2017     Time Spent in minutes 35 minutes spent  Greater than 50% of time spent in care coordination and counseling patient regarding the condition and plan of care.   Dustin Flock M.D on 10/14/2017 at 3:25 PM  Between 7am to 6pm - Pager - [phone]  After 6pm go to [email or web address] - Proofreader  [employer]   Office  [phone]

## 2017-10-14 NOTE — Progress Notes (Addendum)
Inpatient Diabetes Program Recommendations  AACE/ADA: New Consensus Statement on Inpatient Glycemic Control (2015)  Target Ranges:  Prepandial:   less than 140 mg/dL      Peak postprandial:   less than 180 mg/dL (1-2 hours)      Critically ill patients:  140 - 180 mg/dL   Results for John Reilly, John Reilly (MRN [id number]) as of 10/14/2017 07:54  Ref. Range 10/13/2017 17:29  Sodium Latest Ref Range: 135 - 145 mmol/L 133 (L)  Potassium Latest Ref Range: 3.5 - 5.1 mmol/L 4.1  Chloride Latest Ref Range: 101 - 111 mmol/L 96 (L)  CO2 Latest Ref Range: 22 - 32 mmol/L <7 (L)  Glucose Latest Ref Range: 65 - 99 mg/dL 765 (HH)  BUN Latest Ref Range: 6 - 20 mg/dL 12  Creatinine Latest Ref Range: 0.61 - 1.24 mg/dL 1.58 (H)  Calcium Latest Ref Range: 8.9 - 10.3 mg/dL 7.8 (L)  Anion gap Latest Ref Range: 5 - 15  NOT CALCULATED    Admit with: DKA/ Sepsis secondary to UTI  History: Type 1 DM (diagnosed 1988), Parkinson's, Dementia, ETOH   Home DM Meds: Insulin Pump  Current Insulin Orders: IV Insulin Drip      BMET from 2:44am today showed pt's CO2 still low at 12 and Anion Gap still elevated to 16.  ABG and current BMET pending.   MD- Do not recommend transitioning patient to SQ insulin until CO2 is closer to 20 and Anion gap 12 or less.  If patient remains disoriented at time of transition to SQ insulin, will not be able to transition back to insulin pump.  Will need to transition to Lantus and Novolog.  Patient has to be A&O and able to independently operate insulin pump to be able to resume personal insulin pump.  When BMET has improved and if we need to transition to Lantus and Novolog, recommend the following amounts of SQ insulin:  Lantus 15 units daily (make sure to give Lantus 1-2 hour prior to d/c of IV Insulin drip)  Novolog Sensitive Correction Scale/ SSI (0-9 units) TID AC + HS  Novolog Meal Coverage: Novolog 3 units TID with meals (hold if pt eats <50% of  meal)     Endocrinologist: Dr. Adella Hare with [facility]- Last seen 09/03/17.  At that ENDO visit, no changes made to pt's insulin pump (Medtronic 670g).  Insulin Pump settings were as follows: Basal Rates: 12am- 0.625 units/hr 3am- 0.550 units/hr 4pm- 0.725 units/hr 7pm- 0.725 units/hr  Total Basal Insulin per 24 hour period= 14.825 units  Bolus Settings:  Insulin to Carbohydrate ratio= 1 unit for every 17 grams of carbs and 1 unit for every 18 grams carbs Correction Factor= 1 unit for every 60 mg/dl > target CBG of 100-130 mg/dl (and at 2pm correction factor changed to 1 unit for every 50 mg/dl > target CBG)  Target CBG= 100-130 mg/dl    --Will follow patient during hospitalization--  Wyn Quaker RN, MSN, CDE Diabetes Coordinator Inpatient Glycemic Control Team Team Pager: [phone] (8a-5p)

## 2017-10-14 NOTE — Progress Notes (Signed)
Met with pt (wife was present as well) this afternoon around 1:30pm.  Patient was more alert this afternoon, but still seemed somewhat confused to me.  Kept rifling through his insulin pump supply box and told me he needed a new sensor (although he had a new sensor in the box of pump supplies).  Wife brought a new sensor device, new insertion site, new reservoir, and new tubing.  Did not bring the transmitter for the sensor device.  I attempted to orient pt to this fact and he kept getting confused saying he didn't have the sensor device.  Was easily distracted while I was in the room and asked me a few times if I was the Insulin Pump representative from the pump company even though I told him at least twice that I was the diabetes nurse for the hospital.  Wife did not seem to know anything about insulin pumps and stated to me that the patient is responsible for his own insulin pump.  Note that patient last saw Dr. Eddie Dibbles back on 09/03/17.  Has appointment to see Dr. Honor Junes with [facility], but not until 02/28/18.  Patient took his pump and showed me some of his settings.  Upon investigation, I noted that there were NO pump settings programmed into the pump (NO basal rates and NO bolus settings).  Upon inquiry, pt told me his other insulin pump broke and that the insulin pump company sent him a new pump on Monday (06/03).  Pt stated he tried to call the 1-800# pump manufacturer for assistance in programming the pump, however, he stated he couldn't get any help (this seems odd to me b/c the 1-800# help line for the pump is available 24/7).  Pt did not program any rates into the pump and it appears pt did not get any insulin since Monday, thus precipitating DKA.  Unsure if pt tried to manually bolus himself with the pump or with a syringe at home??  I am very uncomfortable with pt resuming his insulin pump at this time.  Currently pt has 15 units of Lantus on board which will last until approximately 10am  tomorrow.  I have placed a call to Dr. Sherren Mocha office this afternoon to review case and seek advice for what to do for this patient.  Pt's wife does not assist pt with insulin pump and right now pt does appear to be able to independently manage the pump on his own.  We could assist with programming the pump, but we do not have any pump settings to use.  Would need permission from Endocrinology team and Hospital MD team to do this.  Plan is to give patient SQ injections for now and reassess in the AM.    --Will follow patient during hospitalization--  Wyn Quaker RN, MSN, CDE Diabetes Coordinator Inpatient Glycemic Control Team Team Pager: [phone] (8a-5p)

## 2017-10-14 NOTE — Progress Notes (Signed)
Pharmacy Antibiotic Note  John Reilly is a 62 y.o. male admitted on 10/13/2017 with sepsis.  Pharmacy has been consulted for vanc/levaquin/aztreonam dosing. Patient received levaquin 750 mg, aztreonam 2g in ED, then vanc 1g IV x 1 in unit  Plan: Will continue vanc 1g IV q12h w/ 6 hour stack  Will draw vanc trough 06/07 @ 2100 prior to 4th dose. Will continue w/ levaquin 750 mg IV daily (CrCl > 49 ml/min); QTc 420 WNL Will continue w/ aztreonam 1g IV q12h  Ke 0.0531 T1/2 12 hrs Goal trough 15 - 20 mcg/mL  Height: 5\' 10"  (177.8 cm) Weight: 162 lb 14.7 oz (73.9 kg) IBW/kg (Calculated) : 73  Temp (24hrs), Avg:99.3 F (37.4 C), Min:97.4 F (36.3 C), Max:100.7 F (38.2 C)  Recent Labs  Lab 10/13/17 1729 10/13/17 1841 10/13/17 2013 10/13/17 2341  WBC 11.2*  --  13.0*  --   CREATININE 1.58* 1.46* 1.35*  --   LATICACIDVEN 9.2* 6.8* 7.1* 4.2*    Estimated Creatinine Clearance: 58.6 mL/min (A) (by C-G formula based on SCr of 1.35 mg/dL (H)).    Allergies  Allergen Reactions  . Penicillins Rash    Has patient had a PCN reaction causing immediate rash, facial/tongue/throat swelling, SOB or lightheadedness with hypotension: Yes Has patient had a PCN reaction causing severe rash involving mucus membranes or skin necrosis: No Has patient had a PCN reaction that required hospitalization: No Has patient had a PCN reaction occurring within the last 10 years: No If all of the above answers are "NO", then may proceed with Cephalosporin use.    Thank you for allowing pharmacy to be a part of this patient's care.  Tobie Lords, PharmD, BCPS Clinical Pharmacist 10/14/2017

## 2017-10-14 NOTE — Progress Notes (Signed)
Pharmacy Electrolyte Monitoring Consult:  Pharmacy consulted to assist in monitoring and replacing electrolytes in this 62 y.o. male admitted on 10/13/2017 with DKA.   Patient ordered D5/0.45% NS @ 134mL/hr and potassium 26mEq IV Q1hr x 6 doses.   Labs:  Sodium (mmol/L)  Date Value  10/14/2017 140   Potassium (mmol/L)  Date Value  10/14/2017 2.4 (LL)   Magnesium (mg/dL)  Date Value  10/14/2017 0.9 (LL)   Phosphorus (mg/dL)  Date Value  10/14/2017 1.3 (L)   Calcium (mg/dL)  Date Value  10/14/2017 6.4 (LL)   Albumin (g/dL)  Date Value  10/13/2017 2.6 (L)    Assessment/Plan:  Will transition MIVF to include 37mEq of Potassium/L.   Potassium phosphate 65mmol IV x 1.   Magnesium 4g IV Q4hr.   Will follow along with scheduled BMPs. Will recheck phosphorus/magensium at 1800.   Pharmacy will continue to monitor and adjust per consult.   Sruthi Maurer L 10/14/2017 1:48 PM

## 2017-10-15 DIAGNOSIS — R7881 Bacteremia: Secondary | ICD-10-CM

## 2017-10-15 LAB — BLOOD CULTURE ID PANEL (REFLEXED)
Acinetobacter baumannii: NOT DETECTED
CANDIDA ALBICANS: NOT DETECTED
CANDIDA TROPICALIS: NOT DETECTED
CARBAPENEM RESISTANCE: NOT DETECTED
Candida glabrata: NOT DETECTED
Candida krusei: NOT DETECTED
Candida parapsilosis: NOT DETECTED
ENTEROBACTER CLOACAE COMPLEX: NOT DETECTED
Enterobacteriaceae species: DETECTED — AB
Enterococcus species: NOT DETECTED
Escherichia coli: NOT DETECTED
HAEMOPHILUS INFLUENZAE: NOT DETECTED
Klebsiella oxytoca: NOT DETECTED
Klebsiella pneumoniae: DETECTED — AB
Listeria monocytogenes: NOT DETECTED
NEISSERIA MENINGITIDIS: NOT DETECTED
Proteus species: NOT DETECTED
Pseudomonas aeruginosa: NOT DETECTED
SERRATIA MARCESCENS: NOT DETECTED
STAPHYLOCOCCUS AUREUS BCID: NOT DETECTED
STAPHYLOCOCCUS SPECIES: NOT DETECTED
STREPTOCOCCUS AGALACTIAE: NOT DETECTED
STREPTOCOCCUS SPECIES: NOT DETECTED
Streptococcus pneumoniae: NOT DETECTED
Streptococcus pyogenes: NOT DETECTED

## 2017-10-15 LAB — CBC
HEMATOCRIT: 21.7 % — AB (ref 40.0–52.0)
Hemoglobin: 7.4 g/dL — ABNORMAL LOW (ref 13.0–18.0)
MCH: 35 pg — ABNORMAL HIGH (ref 26.0–34.0)
MCHC: 33.9 g/dL (ref 32.0–36.0)
MCV: 103.2 fL — ABNORMAL HIGH (ref 80.0–100.0)
PLATELETS: 389 10*3/uL (ref 150–440)
RBC: 2.11 MIL/uL — ABNORMAL LOW (ref 4.40–5.90)
RDW: 14.9 % — AB (ref 11.5–14.5)
WBC: 6.5 10*3/uL (ref 3.8–10.6)

## 2017-10-15 LAB — BASIC METABOLIC PANEL
Anion gap: 6 (ref 5–15)
Anion gap: 9 (ref 5–15)
BUN: 6 mg/dL (ref 6–20)
BUN: 6 mg/dL (ref 6–20)
CHLORIDE: 108 mmol/L (ref 101–111)
CO2: 18 mmol/L — ABNORMAL LOW (ref 22–32)
CO2: 19 mmol/L — AB (ref 22–32)
CREATININE: 0.64 mg/dL (ref 0.61–1.24)
Calcium: 6.8 mg/dL — ABNORMAL LOW (ref 8.9–10.3)
Calcium: 7.1 mg/dL — ABNORMAL LOW (ref 8.9–10.3)
Chloride: 112 mmol/L — ABNORMAL HIGH (ref 101–111)
Creatinine, Ser: 0.59 mg/dL — ABNORMAL LOW (ref 0.61–1.24)
GFR calc Af Amer: >60 mL/min (ref >60)
GFR calc non Af Amer: >60 mL/min (ref >60)
GFR calc non Af Amer: >60 mL/min (ref >60)
Glucose, Bld: 194 mg/dL — ABNORMAL HIGH (ref 65–99)
Glucose, Bld: 230 mg/dL — ABNORMAL HIGH (ref 65–99)
POTASSIUM: 4 mmol/L (ref 3.5–5.1)
Potassium: >7.5 mmol/L (ref 3.5–5.1)
Sodium: 136 mmol/L (ref 135–145)
Sodium: 136 mmol/L (ref 135–145)

## 2017-10-15 LAB — GLUCOSE, CAPILLARY
GLUCOSE-CAPILLARY: 156 mg/dL — AB (ref 65–99)
GLUCOSE-CAPILLARY: 126 mg/dL — AB (ref 65–99)
GLUCOSE-CAPILLARY: 89 mg/dL (ref 65–99)
Glucose-Capillary: 225 mg/dL — ABNORMAL HIGH (ref 65–99)

## 2017-10-15 LAB — IRON AND TIBC: Iron: 25 ug/dL — ABNORMAL LOW (ref 45–182)

## 2017-10-15 LAB — ALBUMIN: ALBUMIN: 1.6 g/dL — AB (ref 3.5–5.0)

## 2017-10-15 LAB — MAGNESIUM: Magnesium: 1.5 mg/dL — ABNORMAL LOW (ref 1.7–2.4)

## 2017-10-15 LAB — RPR: RPR Ser Ql: NONREACTIVE

## 2017-10-15 LAB — PHOSPHORUS: Phosphorus: 3.2 mg/dL (ref 2.5–4.6)

## 2017-10-15 LAB — PROCALCITONIN: Procalcitonin: 1.04 ng/mL

## 2017-10-15 MED ORDER — SODIUM CHLORIDE 0.9% FLUSH
3.0000 mL | Freq: Two times a day (BID) | INTRAVENOUS | Status: DC
  Administered 2017-10-15 – 2017-10-16 (×4): 3 mL via INTRAVENOUS

## 2017-10-15 MED ORDER — CHLORHEXIDINE GLUCONATE CLOTH 2 % EX PADS
6.0000 | MEDICATED_PAD | Freq: Every day | CUTANEOUS | Status: DC
  Administered 2017-10-16 – 2017-10-20 (×3): 6 via TOPICAL

## 2017-10-15 MED ORDER — SODIUM CHLORIDE 0.9% FLUSH
3.0000 mL | Freq: Two times a day (BID) | INTRAVENOUS | Status: DC
  Administered 2017-10-15: 3 mL via INTRAVENOUS

## 2017-10-15 MED ORDER — MUPIROCIN 2 % EX OINT
1.0000 "application " | TOPICAL_OINTMENT | Freq: Two times a day (BID) | CUTANEOUS | Status: DC
  Administered 2017-10-15 (×2): 1 via NASAL
  Filled 2017-10-15: qty 22

## 2017-10-15 MED ORDER — MELATONIN 5 MG PO TABS
2.5000 mg | ORAL_TABLET | Freq: Every day | ORAL | Status: DC
  Administered 2017-10-15 – 2017-10-19 (×5): 2.5 mg via ORAL
  Filled 2017-10-15 (×6): qty 0.5

## 2017-10-15 MED ORDER — SODIUM CHLORIDE 0.9 % IV SOLN
2.0000 g | INTRAVENOUS | Status: DC
  Administered 2017-10-15: 2 g via INTRAVENOUS
  Filled 2017-10-15: qty 2
  Filled 2017-10-15: qty 20

## 2017-10-15 MED ORDER — INSULIN ASPART 100 UNIT/ML ~~LOC~~ SOLN
1000.0000 [IU] | Freq: Once | SUBCUTANEOUS | Status: DC
  Filled 2017-10-15: qty 10

## 2017-10-15 MED ORDER — INSULIN PUMP
Freq: Three times a day (TID) | SUBCUTANEOUS | Status: DC
  Administered 2017-10-17 – 2017-10-18 (×4): via SUBCUTANEOUS
  Filled 2017-10-15: qty 1

## 2017-10-15 MED ORDER — MAGNESIUM SULFATE 2 GM/50ML IV SOLN
2.0000 g | Freq: Once | INTRAVENOUS | Status: AC
  Administered 2017-10-15: 2 g via INTRAVENOUS
  Filled 2017-10-15: qty 50

## 2017-10-15 MED ORDER — SODIUM CHLORIDE 0.9 % IV SOLN
2.0000 g | Freq: Three times a day (TID) | INTRAVENOUS | Status: DC
  Administered 2017-10-15: 2 g via INTRAVENOUS
  Filled 2017-10-15 (×4): qty 2

## 2017-10-15 MED ORDER — SODIUM CHLORIDE 0.9% FLUSH
3.0000 mL | Freq: Two times a day (BID) | INTRAVENOUS | Status: DC
  Administered 2017-10-15 – 2017-10-19 (×8): 3 mL via INTRAVENOUS

## 2017-10-15 MED ORDER — SODIUM CHLORIDE 0.9 % IV SOLN
300.0000 mg | Freq: Once | INTRAVENOUS | Status: AC
  Administered 2017-10-15: 300 mg via INTRAVENOUS
  Filled 2017-10-15: qty 15

## 2017-10-15 NOTE — Progress Notes (Signed)
PHARMACY - PHYSICIAN COMMUNICATION CRITICAL VALUE ALERT - BLOOD CULTURE IDENTIFICATION (BCID)  Results for orders placed or performed during the hospital encounter of 10/13/17  Blood Culture ID Panel (Reflexed) (Collected: 10/13/2017  5:29 PM)  Result Value Ref Range   Enterococcus species NOT DETECTED NOT DETECTED   Listeria monocytogenes NOT DETECTED NOT DETECTED   Staphylococcus species NOT DETECTED NOT DETECTED   Staphylococcus aureus NOT DETECTED NOT DETECTED   Streptococcus species NOT DETECTED NOT DETECTED   Streptococcus agalactiae NOT DETECTED NOT DETECTED   Streptococcus pneumoniae NOT DETECTED NOT DETECTED   Streptococcus pyogenes NOT DETECTED NOT DETECTED   Acinetobacter baumannii NOT DETECTED NOT DETECTED   Enterobacteriaceae species DETECTED (A) NOT DETECTED   Enterobacter cloacae complex NOT DETECTED NOT DETECTED   Escherichia coli NOT DETECTED NOT DETECTED   Klebsiella oxytoca NOT DETECTED NOT DETECTED   Klebsiella pneumoniae DETECTED (A) NOT DETECTED   Proteus species NOT DETECTED NOT DETECTED   Serratia marcescens NOT DETECTED NOT DETECTED   Carbapenem resistance NOT DETECTED NOT DETECTED   Haemophilus influenzae NOT DETECTED NOT DETECTED   Neisseria meningitidis NOT DETECTED NOT DETECTED   Pseudomonas aeruginosa NOT DETECTED NOT DETECTED   Candida albicans NOT DETECTED NOT DETECTED   Candida glabrata NOT DETECTED NOT DETECTED   Candida krusei NOT DETECTED NOT DETECTED   Candida parapsilosis NOT DETECTED NOT DETECTED   Candida tropicalis NOT DETECTED NOT DETECTED    Name of physician (or Provider) Contacted: Shreyang Patel  Changes to prescribed antibiotics required: start aztreonam 2 gm IV Q8H  Laural Benes, Pharm.D., BCPS Clinical Pharmacist 10/15/2017  2:55 PM

## 2017-10-15 NOTE — Progress Notes (Signed)
[place] at [facility]                                                                                                                                                                                  Patient Demographics   John Reilly, is a 62 y.o. male, DOB - 01/12/1956, XAJ:[id number]  Admit date - 10/13/2017   Admitting Physician Gorden Harms, MD  Outpatient Primary MD for the patient is Glendon Axe, MD   LOS - 2  Subjective: Feeling better better still very weak    Review of Systems:   CONSTITUTIONAL: No documented fever. No fatigue, positive weakness. No weight gain, no weight loss.  EYES: No blurry or double vision.  ENT: No tinnitus. No postnasal drip. No redness of the oropharynx.  RESPIRATORY: Positive cough, no wheeze, no hemoptysis. No dyspnea.  CARDIOVASCULAR: No chest pain. No orthopnea. No palpitations. No syncope.  GASTROINTESTINAL: No nausea, no vomiting or diarrhea. No abdominal pain. No melena or hematochezia.  GENITOURINARY: No dysuria or hematuria.  ENDOCRINE: No polyuria or nocturia. No heat or cold intolerance.  HEMATOLOGY: No anemia. No bruising. No bleeding.  INTEGUMENTARY: No rashes. No lesions.  MUSCULOSKELETAL: No arthritis. No swelling. No gout.  NEUROLOGIC: No numbness, tingling, or ataxia. No seizure-type activity.  PSYCHIATRIC: No anxiety. No insomnia. No ADD.    Vitals:   Vitals:   10/14/17 1733 10/14/17 1933 10/15/17 0335 10/15/17 0815  BP:  111/72 129/69 131/66  Pulse:  79 62 67  Resp: 16 19 19    Temp:  97.7 F (36.5 C) 98.4 F (36.9 C) 98 F (36.7 C)  TempSrc:  Oral Oral Oral  SpO2:  100% 100% 98%  Weight:      Height:        Wt Readings from Last 3 Encounters:  10/14/17 78.7 kg (173 lb 8 oz)  10/04/17 73 kg (161 lb)  10/12/16 85.7 kg (189 lb)     Intake/Output Summary (Last 24 hours) at 10/15/2017 1253 Last data filed at 10/15/2017 0745 Gross per 24 hour  Intake 2114.91 ml  Output 1229 ml   Net 885.91 ml    Physical Exam:   GENERAL: Pleasant-appearing in no apparent distress.  HEAD, EYES, EARS, NOSE AND THROAT: Atraumatic, normocephalic. Extraocular muscles are intact. Pupils equal and reactive to light. Sclerae anicteric. No conjunctival injection. No oro-pharyngeal erythema.  NECK: Supple. There is no jugular venous distention. No bruits, no lymphadenopathy, no thyromegaly.  HEART: Regular rate and rhythm,. No murmurs, no rubs, no clicks.  LUNGS: Rhonchus breath sounds bilaterally  aBDOMEN: Soft, flat, nontender, nondistended. Has good bowel sounds. No hepatosplenomegaly appreciated.  EXTREMITIES: No evidence of  any cyanosis, clubbing, or peripheral edema.  +2 pedal and radial pulses bilaterally.  NEUROLOGIC: The patient is alert, awake, and oriented x3 with no focal motor or sensory deficits appreciated bilaterally.  SKIN: Moist and warm with no rashes appreciated.  Psych: Not anxious, depressed LN: No inguinal LN enlargement    Antibiotics   Anti-infectives (From admission, onward)   Start     Dose/Rate Route Frequency Ordered Stop   10/14/17 1800  levofloxacin (LEVAQUIN) IVPB 750 mg  Status:  Discontinued     750 mg 100 mL/hr over 90 Minutes Intravenous Every 24 hours 10/14/17 0339 10/14/17 1004   10/14/17 1000  vancomycin (VANCOCIN) IVPB 1000 mg/200 mL premix  Status:  Discontinued     1,000 mg 200 mL/hr over 60 Minutes Intravenous Every 12 hours 10/14/17 0348 10/14/17 1004   10/14/17 0600  aztreonam (AZACTAM) 1 g in sodium chloride 0.9 % 100 mL IVPB  Status:  Discontinued     1 g 200 mL/hr over 30 Minutes Intravenous Every 12 hours 10/14/17 0339 10/14/17 1004   10/13/17 1800  levofloxacin (LEVAQUIN) IVPB 750 mg     750 mg 100 mL/hr over 90 Minutes Intravenous  Once 10/13/17 1752 10/13/17 1935   10/13/17 1800  aztreonam (AZACTAM) 2 g in sodium chloride 0.9 % 100 mL IVPB     2 g 200 mL/hr over 30 Minutes Intravenous  Once 10/13/17 1752 10/13/17 1837    10/13/17 1800  vancomycin (VANCOCIN) IVPB 1000 mg/200 mL premix     1,000 mg 200 mL/hr over 60 Minutes Intravenous  Once 10/13/17 1752 10/14/17 0439      Medications   Scheduled Meds: . carbidopa-levodopa  1 tablet Oral QHS  . carbidopa-levodopa  2 tablet Oral TID PC  . folic acid  1 mg Oral Daily  . heparin  5,000 Units Subcutaneous Q8H  . insulin aspart  0-5 Units Subcutaneous QHS  . insulin aspart  0-9 Units Subcutaneous TID WC  . insulin aspart  1,000 Units Intravenous Once  . insulin aspart  3 Units Subcutaneous TID WC  . insulin pump   Subcutaneous TID AC, HS, 0200  . multivitamin with minerals  1 tablet Oral Daily  . pantoprazole  40 mg Oral Daily  . thiamine  100 mg Oral Daily   Continuous Infusions: . lactated ringers with kcl 100 mL/hr at 10/15/17 0030  . magnesium sulfate 1 - 4 g bolus IVPB 2 g (10/15/17 1223)   PRN Meds:.acetaminophen, amLODipine, LORazepam **OR** LORazepam   Data Review:   Micro Results Recent Results (from the past 240 hour(s))  Urine Culture     Status: Abnormal (Preliminary result)   Collection Time: 10/13/17  5:24 PM  Result Value Ref Range Status   Specimen Description   Final    URINE, RANDOM Performed at [facility], [address] [place], [place] [address]    Special Requests   Final    NONE Performed at [facility], [place], [place] [address]    Culture 80,000 COLONIES/mL KLEBSIELLA PNEUMONIAE (A)  Final   Report Status PENDING  Incomplete  Blood Culture (routine x 2)     Status: None (Preliminary result)   Collection Time: 10/13/17  5:29 PM  Result Value Ref Range Status   Specimen Description BLOOD RIGHT ANTECUBITAL  Final   Special Requests   Final    BOTTLES DRAWN AEROBIC AND ANAEROBIC Blood Culture adequate volume   Culture  Setup Time   Final  Organism ID to follow Performed at [facility], [address] [place], [place] [address]    Culture PENDING  Incomplete    Report Status PENDING  Incomplete  Blood Culture (routine x 2)     Status: None (Preliminary result)   Collection Time: 10/13/17  5:34 PM  Result Value Ref Range Status   Specimen Description BLOOD BLOOD RIGHT FOREARM  Final   Special Requests   Final    BOTTLES DRAWN AEROBIC AND ANAEROBIC Blood Culture adequate volume   Culture   Final    NO GROWTH 2 DAYS Performed at [facility], [address], [place], [place] [address]    Report Status PENDING  Incomplete  MRSA PCR Screening     Status: Abnormal   Collection Time: 10/13/17  8:00 PM  Result Value Ref Range Status   MRSA by PCR POSITIVE (A) NEGATIVE Final    Comment:        The GeneXpert MRSA Assay (FDA approved for NASAL specimens only), is one component of a comprehensive MRSA colonization surveillance program. It is not intended to diagnose MRSA infection nor to guide or monitor treatment for MRSA infections. RESULT CALLED TO, READ BACK BY AND VERIFIED WITH: DELL HOPKINS ON 10/13/17 AT 2228 JAG Performed at [facility], [address] [place], [place] [address]     Radiology Reports Dg Chest 1 View  Result Date: 10/02/2017 CLINICAL DATA:  Generalized weakness, recent history of fall EXAM: CHEST  1 VIEW COMPARISON:  03/19/2015 FINDINGS: No focal consolidation or effusion. Scarring at the left lung base. Stable cardiomediastinal silhouette. No pneumothorax. Old left-sided rib fracture. Suspected acute displaced right second rib fracture. Mildly displaced fracture involving the proximal shaft of the left humerus. IMPRESSION: 1. Mild scarring at the left lung base.  No acute opacity 2. Acute mildly displaced fracture proximal shaft of the left humerus. Acute appearing displaced right second rib fracture. Electronically Signed   By: Donavan Foil M.D.   On: 10/02/2017 23:50   Ct Head Wo Contrast  Result Date: 10/03/2017 CLINICAL DATA:  Fall with bruising to the left eye EXAM: CT HEAD WITHOUT CONTRAST  TECHNIQUE: Contiguous axial images were obtained from the base of the skull through the vertex without intravenous contrast. COMPARISON:  MRI 12/16/2016 FINDINGS: Brain: No acute territorial infarction, hemorrhage or intracranial mass is visualized. Moderate atrophy. Slight asymmetric enlargement of left anterior convexity extra-axial CSF density. Mild small vessel ischemic changes of the white matter. Nonenlarged ventricles. Vascular: No hyperdense vessels. Scattered calcifications at the carotid siphon Skull: Normal. Negative for fracture or focal lesion. Sinuses/Orbits: No acute finding. Other: Mild left periorbital soft tissue swelling IMPRESSION: 1. No CT evidence for acute intracranial abnormality. 2. Atrophy and mild small vessel ischemic changes of the white matter 3. 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 Electronically Signed   By: Donavan Foil M.D.   On: 10/03/2017 00:04   Dg Chest Port 1 View  Result Date: 10/13/2017 CLINICAL DATA:  Altered mental status. EXAM: PORTABLE CHEST 1 VIEW COMPARISON:  10/02/2017 FINDINGS: Minimal scarring at the left base. No acute airspace opacities or effusions. Heart is normal size. No acute bony abnormality. IMPRESSION: No active disease. Electronically Signed   By: Rolm Baptise M.D.   On: 10/13/2017 18:12   Dg Shoulder Left  Result Date: 10/02/2017 CLINICAL DATA:  Generalized weakness since this morning. Patient fell yesterday and hit the left shoulder. Limited movement. EXAM:  LEFT SHOULDER - 2+ VIEW COMPARISON:  None. FINDINGS: Comminuted fractures of the left humeral head and neck with transverse fracture line across the surgical neck and vertical fracture lines extending to the greater tuberosity. There is impaction of fracture fragments with lateral angulation of the distal humeral fracture fragment. No dislocation at the shoulder joint. Multiple tiny displaced fragments are  visualized. Visualized clavicle, scapula, and acromion appear intact. IMPRESSION: Comminuted fractures of the left humeral head and neck with impaction of fracture fragments and lateral angulation of the distal fracture fragment. Electronically Signed   By: Lucienne Capers M.D.   On: 10/02/2017 23:47     CBC Recent Labs  Lab 10/13/17 1729 10/13/17 2013 10/14/17 0804 10/15/17 0527  WBC 11.2* 13.0* 11.0* 6.5  HGB 10.0* 8.6* 7.3* 7.4*  HCT 33.2* 27.5* 21.4* 21.7*  PLT 668* 480* 434 389  MCV 116.4* 113.8* 101.3* 103.2*  MCH 35.0* 35.7* 34.5* 35.0*  MCHC 30.1* 31.4* 34.0 33.9  RDW 16.2* 15.6* 14.5 14.9*  LYMPHSABS 2.5  --  1.3  --   MONOABS 1.0  --  0.8  --   EOSABS 0.0  --  0.0  --   BASOSABS 0.1  --  0.0  --     Chemistries  Recent Labs  Lab 10/13/17 1729  10/14/17 0804 10/14/17 1622 10/14/17 1936 10/15/17 0527 10/15/17 0829  NA 133*   < > 140 140 140 136 136  K 4.1   < > 2.4* 2.9* 2.8* >7.5* 4.0  CL 96*   < > 113* 112* 112* 112* 108  CO2 <7*   < > 21* 21* 21* 18* 19*  GLUCOSE 765*   < > 102* 98 63* 194* 230*  BUN 12   < > 10 9 8 6 6   CREATININE 1.58*   < > 0.90 0.88 0.80 0.59* 0.64  CALCIUM 7.8*   < > 6.4* 6.9* 6.9* 6.8* 7.1*  MG  --   --  0.9*  --  1.6* 1.5*  --   AST 32  --   --   --   --   --   --   ALT <5*  --   --   --   --   --   --   ALKPHOS 149*  --   --   --   --   --   --   BILITOT 1.9*  --   --   --   --   --   --    < > = values in this interval not displayed.   ------------------------------------------------------------------------------------------------------------------ estimated creatinine clearance is 98.9 mL/min (by C-G formula based on SCr of 0.64 mg/dL). ------------------------------------------------------------------------------------------------------------------ Recent Labs    10/13/17 2013  HGBA1C 7.0*   ------------------------------------------------------------------------------------------------------------------ No results for  input(s): CHOL, HDL, LDLCALC, TRIG, CHOLHDL, LDLDIRECT in the last 72 hours. ------------------------------------------------------------------------------------------------------------------ No results for input(s): TSH, T4TOTAL, T3FREE, THYROIDAB in the last 72 hours.  Invalid input(s): FREET3 ------------------------------------------------------------------------------------------------------------------ Recent Labs    10/14/17 1622  VITAMINB12 734  FOLATE 30.0  FERRITIN 344*  TIBC NOT CALCULATED  IRON 25*  RETICCTPCT 1.1    Coagulation profile No results for input(s): INR, PROTIME in the last 168 hours.  No results for input(s): DDIMER in the last 72 hours.  Cardiac Enzymes Recent Labs  Lab 10/13/17 1729 10/13/17 2013 10/14/17 0244  TROPONINI 0.03* 0.08* 0.20*   ------------------------------------------------------------------------------------------------------------------ Invalid input(s): POCBNP    Assessment & Plan  Patient 62 year old admitted with DKA  #1  acute DKA Patient received Lantus this morning Diabetic coordinator would like to turn on his insulin pump prior to discharge Switch to insulin once anion gap normalized  #2 anemia Panel consistent with anemia of chronic disease Recheck CBC today  #3Acute encephalopathy most likely secondary to above, compounded by Parkinson's disease with dementia and heavy alcohol abuse improved PT evaluation  #4 cough with elevated procalcitonin  Chest x-ray is negative no indication for antibiotic  #5 acute kidney injury due to dehydration renal function now normal  #6 chronic heavy alcohol abuse with chronic hyponatremia monitor for withdrawal  #7 hypokalemia  Patient had a falsely elevated potassium earlier today recheck is normal   #8 miscellaneous Lovenox for DVT prophylaxis     Code Status Orders  (From admission, onward)        Start     Ordered   10/13/17 1956  Full code  Continuous      10/13/17 1955    Code Status History    Date Active Date Inactive Code Status Order ID Comments User Context   10/03/2017 0209 10/04/2017 1452 Full Code [id number]  Amelia Jo, MD Inpatient   03/19/2015 1939 03/21/2015 1928 Full Code [id number]  Bettey Costa, MD Inpatient   03/15/2015 2114 03/16/2015 1837 Full Code [id number]  Hower, Aaron Mose, MD ED           Consults tensive  DVT Prophylaxis  Lovenox  Lab Results  Component Value Date   PLT 389 10/15/2017     Time Spent in minutes 35 minutes spent  Greater than 50% of time spent in care coordination and counseling patient regarding the condition and plan of care.   Dustin Flock M.D on 10/15/2017 at 12:53 PM  Between 7am to 6pm - Pager - [phone]  After 6pm go to [email or web address] - Proofreader  [employer]   Office  [phone]

## 2017-10-15 NOTE — Plan of Care (Signed)
  Problem: Pain Managment: Goal: General experience of comfort will improve Outcome: Progressing   Problem: Safety: Goal: Ability to remain free from injury will improve Outcome: Progressing   

## 2017-10-15 NOTE — Progress Notes (Signed)
CRITICAL VALUE ALERT  Critical Value:  Potassium >7.5  Date & Time Notied:  10/15/17  0755  Provider Notified: Dr. Dustin Flock Orders Received/Actions taken: Stat Potassium.  If drawn while IV running it may be an error.   EKG obtained.  Placed back on telemetry.

## 2017-10-15 NOTE — Progress Notes (Signed)
Lab tech reports that IV was turned off during draw.

## 2017-10-15 NOTE — Progress Notes (Signed)
Potassium of 7.5 was an error

## 2017-10-15 NOTE — Progress Notes (Addendum)
Pharmacy Electrolyte Monitoring Consult:  Pharmacy consulted to assist in monitoring and replacing electrolytes in this 62 y.o. male admitted on 10/13/2017 with DKA.   Labs:  Sodium (mmol/L)  Date Value  10/15/2017 136   Potassium (mmol/L)  Date Value  10/15/2017 >7.5 [facility])   Magnesium (mg/dL)  Date Value  10/15/2017 1.5 (L)   Phosphorus (mg/dL)  Date Value  10/15/2017 3.2   Calcium (mg/dL)  Date Value  10/15/2017 6.8 (L)   Albumin (g/dL)  Date Value  10/15/2017 1.6 (L)    Assessment/Plan:  Patient did not receive potassium phosphate this am - discontinued to error.  Potassium phosphate 39mmol IV x 1.   Magnesium 2g IV Q4hr.   Will recheck electrolytes at 0700.   10/15/17 05:27 K >7.5, Mg 1.5, phos 3.2, albumin 1.6, calcium 6.8, adjusted calcium 8.72. Ordered repeat potassium and called lab for recollect (to rule out collection error). Spoke with RN - infusion of LR with 40 mEq/L KCl has been stopped. Will give magnesium sulfate 2 gm IV x 1 and follow up repeat potassium. Recheck all electrolytes tomorrow with AM labs.  10/15/17 08:29 repeat K 4. No further supplement warranted. Will give magnesium as above and recheck all electrolytes tomorrow with AM labs.  Pharmacy will continue to monitor and adjust per consult.   Laural Benes 10/15/2017 8:07 AM

## 2017-10-15 NOTE — Progress Notes (Signed)
Met with pt and his wife this AM.  Patient more Alert and Oriented than he was yesterday, however, patient continues to exhibit weakness.  Wife of patient told me that patient has not been able to walk by himself for over a week after he fell and hurt his shoulder.  Wife works Tuesday through Saturday and no one is home with patient during the hours that wife is working.  Note that patient received 15 units Lantus this morning at 9am.  Patient will not be able to resume his insulin pump until tomorrow AM (7-8am on 06/08) since he has Lantus on board equivalent to the amount he gets in a basal rate on his pump.  Patient and wife stated understanding.  Pt went ahead and charged the transmitter for his Guardian glucose sensor.  I also assisted patient and wife to reprogram the insulin pump with the latest settings from pt's visit with Dr. Eddie Dibbles (Dr. Sherren Mocha office (CMA) gave [place] permission to use the last known settings from the visit with Dr. Eddie Dibbles back in April).  Pump was reprogrammed (see previous notes for settings) with pt and wife looking at the pump while I pressed the buttons and entered the data.  RN, Lennart Pall also reviewed and verified the pump settings with me as well.  Discussed all of the above information with Dr. Posey Pronto.  Dr. Posey Pronto told me he plans to order a PT consult today.  Dr. Posey Pronto gave me permission to go ahead and have pt restart the insulin pump tomorrow AM and also stated it was OK with him to use the pump settings that Dr. Honor Junes recommended.  Patient and wife instructed that patient is to resume his insulin pump tomorrow AM (7-8am on 06/08).  RN aware as well.  Pharmacy has sent a vial of Novolog for pt to fill his pump tomorrow.  Pt and wife assured me that patient was able to independently operate the insulin pump prior to admission and that pt can physically put the pump on tomorrow.     --Will follow patient during hospitalization--  Wyn Quaker RN,  MSN, CDE Diabetes Coordinator Inpatient Glycemic Control Team Team Pager: [phone] (8a-5p)

## 2017-10-15 NOTE — Evaluation (Signed)
Physical Therapy Evaluation Patient Details Name: John Reilly MRN: [id number] DOB: 08-18-1955 Today's Date: 10/15/2017   History of Present Illness  62yo male who here last week after a fall, Left proximal humeral fracture (now in sling, NWBing), Left 2nd rib fracutre.  He is now here with DKA (blood glucose was 732 on arrival), altered mental status. PMH: Parkinson's disease (diagnosed 6-9 MA), dementia, COPD, PVD, HTN, DM. In ED BG: 35, Na: 125 (pt reports as chronic), K:2.3. Pt reports estimated 4 falls in past 6 months which he attributes to parkinsonism. PMH: also includes severe Rt ankle fracture 5ya which has since limited mobility. Pt uses a QC at baseline, mostly AMB in the home.   Clinical Impression  Pt is able to do some limited walking in the room but is impulsive and showed poor awareness with ambulation.  He was inconsistent with cane use and more than a Parkinsonian shuffling gait he had a more sporatic/ataxic gait.  Despite L UE being in a sling he was able to get to sitting at EOB w/o assist (used rail) and with slightly raised bed was able to get to standing. Pt had been having HHPT since recent hospitalization and will need continued HHPT on d/c.    Follow Up Recommendations Home health PT;Supervision for mobility/OOB    Equipment Recommendations  None recommended by PT    Recommendations for Other Services       Precautions / Restrictions Precautions Shoulder Interventions: Shoulder sling/immobilizer;At all times Restrictions LUE Weight Bearing: Non weight bearing      Mobility  Bed Mobility Overal bed mobility: Modified Independent             General bed mobility comments: heavy use of rail with R UE, but able to rise to sitting w/o assist  Transfers Overall transfer level: Needs assistance Equipment used: Straight cane Transfers: Sit to/from Stand Sit to Stand: Min assist         General transfer comment: Pt reports he has a high bed at home and  as he was unable to rise from standard height PT did raise if a few inches.  He was able to rise from this height w/o direct assist using cane  Ambulation/Gait Ambulation/Gait assistance: Min assist Ambulation Distance (Feet): 25 Feet Assistive device: Straight cane       General Gait Details: Pt with impulsive somewhat ataxic gait with inconsistent steps and overall poor safety awareness.  Inconsistent use of the cane, needed close CGA with ambulation  Stairs            Wheelchair Mobility    Modified Rankin (Stroke Patients Only)       Balance Overall balance assessment: Needs assistance;History of Falls Sitting-balance support: Single extremity supported Sitting balance-[name]: Good       Standing balance-[name]: Fair Standing balance comment: Pt with poor awareness and generally showed unsteadiness t/o his time in standing.  No overt LOBs but ataxic/staggering movements                             Pertinent Vitals/Pain Pain Assessment: No/denies pain Pain Score: 9  Pain Location: Left shoulder - pt does not have pain reactions reflecting the degree of pain he reports    Home Living Family/patient expects to be discharged to:: Private residence Living Arrangements: Spouse/significant other Available Help at Discharge: Family;Other (Comment)(grand daughter checks on him while wife is at work )   Home  Access: Stairs to enter   [employer] of Steps: 3 to enter front with rails, 1 in garage with no rails Home Layout: One level Home Equipment: Cane - quad;Bedside commode      Prior Function Level of Independence: Independent with assistive device(s)         Comments: increased time to complete self care     Hand Dominance        Extremity/Trunk Assessment   Upper Extremity Assessment RUE Deficits / Details: tremor from baseline PD, RUE at baseline strength, WFL for ADLs LUE Deficits / Details: NWB, immobilization     Lower Extremity Assessment Lower Extremity Assessment: Generalized weakness;Overall WFL for tasks assessed       Communication   Communication: No difficulties  Cognition Arousal/Alertness: Awake/alert Behavior During Therapy: WFL for tasks assessed/performed Overall Cognitive Status: History of cognitive impairments - at baseline                                        General Comments      Exercises     Assessment/Plan    PT Assessment Patient needs continued PT services  PT Problem List Decreased activity tolerance;Decreased mobility;Decreased balance       PT Treatment Interventions Gait training;Stair training;Functional mobility training;Therapeutic activities;Therapeutic exercise;Patient/family education;Balance training    PT Goals (Current goals can be found in the Care Plan section)  Acute Rehab PT Goals Patient Stated Goal: go back home PT Goal Formulation: With patient Time For Goal Achievement: 10/29/17 Potential to Achieve Goals: Fair    Frequency Min 2X/week   Barriers to discharge Decreased caregiver support      Co-evaluation               AM-PAC PT "6 Clicks" Daily Activity  Outcome Measure Difficulty turning over in bed (including adjusting bedclothes, sheets and blankets)?: A Little Difficulty moving from lying on back to sitting on the side of the bed? : A Lot Difficulty sitting down on and standing up from a chair with arms (e.g., wheelchair, bedside commode, etc,.)?: A Little Help needed moving to and from a bed to chair (including a wheelchair)?: A Little Help needed walking in hospital room?: A Lot Help needed climbing 3-5 steps with a railing? : A Lot 6 Click Score: 15    End of Session Equipment Utilized During Treatment: Gait belt Activity Tolerance: Patient tolerated treatment well Patient left: in chair;with chair alarm set;with call bell/phone within reach   PT Visit Diagnosis: Unsteadiness on feet  (R26.81);Repeated falls (R29.6);Difficulty in walking, not elsewhere classified (R26.2);Pain Pain - Right/Left: Left Pain - part of body: Shoulder    Time: 1405-1430 PT Time Calculation (min) (ACUTE ONLY): 25 min   Charges:   PT Evaluation $PT Eval Low Complexity: 1 Low     PT G Codes:        Kreg Shropshire, DPT 10/15/2017, 4:21 PM

## 2017-10-15 NOTE — Progress Notes (Addendum)
Inpatient Diabetes Program Recommendations  AACE/ADA: New Consensus Statement on Inpatient Glycemic Control (2015)  Target Ranges:  Prepandial:   less than 140 mg/dL      Peak postprandial:   less than 180 mg/dL (1-2 hours)      Critically ill patients:  140 - 180 mg/dL   Results for John Reilly, John Reilly (MRN [id number]) as of 10/15/2017 07:33  Ref. Range 10/14/2017 07:52 10/14/2017 09:14 10/14/2017 10:30 10/14/2017 11:59 10/14/2017 17:32 10/14/2017 20:54 10/14/2017 22:20  Glucose-Capillary Latest Ref Range: 65 - 99 mg/dL 98  IV Insulin Drip 102 (H)  IV Insulin Drip 98  15 units LANTUS given 115 (H)  IV Insulin Drip OFF 72  3 units NOVOLOG 40 (LL) 88    History: Type 1 DM (diagnosed 1988), Parkinson's, Dementia, ETOH   Home DM Meds: Insulin Pump  Current Insulin Orders: Lantus 15 units daily      Novolog Sensitive Correction Scale/ SSI (0-9 units) TID AC + HS      Novolog 3 units TID with meals     MD- Spoke with Medical Assistant from Dr. Sherren Mocha office yesterday afternoon around 4:30pm [facility]).  Per CMA, Dr. Honor Junes recommends that we assist pt with re-programming his insulin pump with the following pump settings (see below) and allow pt to use pump prior to discharge.  CMA working on getting follow- up appt with Dr. Honor Junes soon after discharge as well.  DM Coordinator can assist patient to re-program insulin pump, but patient will need to be able to fill up pump and put pump back on his body.  Basal Rates: 12am- 0.625 units/hr 3am- 0.550 units/hr 4pm- 0.725 units/hr 7pm- 0.725 units/hr  Total Basal Insulin per 24 hour period= 14.825 units  Bolus Settings:  Insulin to Carbohydrate ratio= 1 unit for every 17 grams of carbs and 1 unit for every 18 grams carbs Correction Factor= 1 unit for every 60 mg/dl > target CBG of 100-130 mg/dl (and at 2pm correction factor changed to 1 unit for every 50 mg/dl > target CBG)  Target CBG= 100-130 mg/dl     --Will follow  patient during hospitalization--  Wyn Quaker RN, MSN, CDE Diabetes Coordinator Inpatient Glycemic Control Team Team Pager: [phone] (8a-5p)

## 2017-10-15 NOTE — Consult Note (Signed)
[facility] Infectious Disease     Reason for Consult: Bacteremia   Referring Physician: Serita Grit Date of Admission:  10/13/2017   Active Problems:   DKA (diabetic ketoacidoses) ([place])   HPI: John Reilly is a 62 y.o. male admitted with AMS and DKA.  On admit he was severly acidotic, sugar > 600, elevated LA, wbc 13, temp 101, cxr neg, UA + > 50 wbc. Since admit bcx and ucx + Klebsiella.  Clinically improving and out of unit. He remains somewhat confused and has a hard time telling me what has been happening. He denies dysuria prior but had lots of urinary frequency. Denies hx of UTIs or bladder/prostate issues. Denies seeing urology in past. Denies hx kidney stones.   Past Medical History:  Diagnosis Date  . Cancer ([place])    Pt reports on 10/02/17 that he has never been dx with cancer  . COPD (chronic obstructive pulmonary disease) ([place])   . Diabetes mellitus without complication ([place])   . GERD (gastroesophageal reflux disease)   . Hypertension   . Multiple duodenal ulcers   . Peripheral vascular disease ([facility])    Peripheral Neuropathy  . Retinopathy    Past Surgical History:  Procedure Laterality Date  . ANKLE ARTHROPLASTY     2013  . COLONOSCOPY WITH PROPOFOL N/A 10/12/2016   Procedure: COLONOSCOPY WITH PROPOFOL;  Surgeon: Manya Silvas, MD;  Location: [facility] ENDOSCOPY;  Service: Endoscopy;  Laterality: N/A;   Social History   Tobacco Use  . Smoking status: Former Smoker    Last attempt to quit: 10/21/2005    Years since quitting: 11.9  . Smokeless tobacco: Never Used  Substance Use Topics  . Alcohol use: Yes    Alcohol/week: 25.2 oz    Types: 42 Cans of beer per week    Comment: patient states he drinks 1 quart of beer per day  . Drug use: No   Family History  Problem Relation Age of Onset  . Stroke Mother   . Alzheimer's disease Father   . Diabetes Brother     Allergies:  Allergies  Allergen Reactions  . Penicillins Rash    Has patient had a PCN reaction  causing immediate rash, facial/tongue/throat swelling, SOB or lightheadedness with hypotension: Yes Has patient had a PCN reaction causing severe rash involving mucus membranes or skin necrosis: No Has patient had a PCN reaction that required hospitalization: No Has patient had a PCN reaction occurring within the last 10 years: No If all of the above answers are "NO", then may proceed with Cephalosporin use.    Current antibiotics: Antibiotics Given (last 72 hours)    Date/Time Action Medication Dose Rate   10/13/17 1803 New Bag/Given   levofloxacin (LEVAQUIN) IVPB 750 mg 750 mg 100 mL/hr   10/13/17 1803 New Bag/Given   aztreonam (AZACTAM) 2 g in sodium chloride 0.9 % 100 mL IVPB 2 g 200 mL/hr   10/14/17 0339 New Bag/Given   vancomycin (VANCOCIN) IVPB 1000 mg/200 mL premix 1,000 mg 200 mL/hr   10/14/17 0603 New Bag/Given   aztreonam (AZACTAM) 1 g in sodium chloride 0.9 % 100 mL IVPB 1 g 200 mL/hr      MEDICATIONS: . carbidopa-levodopa  1 tablet Oral QHS  . carbidopa-levodopa  2 tablet Oral TID PC  . [START ON 10/16/2017] Chlorhexidine Gluconate Cloth  6 each Topical Q0600  . folic acid  1 mg Oral Daily  . heparin  5,000 Units Subcutaneous Q8H  . insulin aspart  0-5 Units Subcutaneous QHS  . insulin aspart  0-9 Units Subcutaneous TID WC  . insulin aspart  1,000 Units Intravenous Once  . insulin aspart  3 Units Subcutaneous TID WC  . insulin pump   Subcutaneous TID AC, HS, 0200  . Melatonin  2.5 mg Oral QHS  . multivitamin with minerals  1 tablet Oral Daily  . mupirocin ointment  1 application Nasal BID  . pantoprazole  40 mg Oral Daily  . sodium chloride flush  3 mL Intravenous Q12H  . sodium chloride flush  3 mL Intravenous Q12H  . sodium chloride flush  3 mL Intravenous Q12H  . thiamine  100 mg Oral Daily    Review of Systems - 11 systems reviewed and negative per HPI   OBJECTIVE: Temp:  [97.7 F (36.5 C)-98.4 F (36.9 C)] 98 F (36.7 C) (06/07 0815) Pulse Rate:   [62-82] 67 (06/07 0815) Resp:  [16-19] 19 (06/07 0335) BP: (111-131)/(66-72) 131/66 (06/07 0815) SpO2:  [97 %-100 %] 98 % (06/07 0815) Weight:  [78.7 kg (173 lb 8 oz)] 78.7 kg (173 lb 8 oz) (06/06 1725) Physical Exam  Constitutional: He is awake and interactive but slowed mentation. R arm in sling HENT: anicteric  Mouth/Throat: Oropharynx is clear and moist. No oropharyngeal exudate.  Cardiovascular: Normal rate, regular rhythm and normal heart sounds. Exam reveals no gallop and no friction rub.  No murmur heard.  Pulmonary/Chest: Effort normal and breath sounds normal. No respiratory distress. He has no wheezes.  Abdominal: Soft. Bowel sounds are normal. He exhibits no distension. There is no tenderness.  Lymphadenopathy:  He has no cervical adenopathy.  Neurological: He is alert and oriented to person, place, and time.  GU - no penile swelling or drainage  Skin: Skin is warm and dry. No rash noted. No erythema.  Psychiatric: somewhat confused     LABS: Results for orders placed or performed during the hospital encounter of 10/13/17 (from the past 48 hour(s))  Urinalysis, Routine w reflex microscopic     Status: Abnormal   Collection Time: 10/13/17  5:24 PM  Result Value Ref Range   Color, Urine YELLOW (A) YELLOW   APPearance CLOUDY (A) CLEAR   Specific Gravity, Urine 1.015 1.005 - 1.030   pH 5.0 5.0 - 8.0   Glucose, UA >=500 (A) NEGATIVE mg/dL   Hgb urine dipstick SMALL (A) NEGATIVE   Bilirubin Urine NEGATIVE NEGATIVE   Ketones, ur 20 (A) NEGATIVE mg/dL   Protein, ur 100 (A) NEGATIVE mg/dL   Nitrite NEGATIVE NEGATIVE   Leukocytes, UA SMALL (A) NEGATIVE   RBC / HPF 0-5 0 - 5 RBC/hpf   WBC, UA >50 (H) 0 - 5 WBC/hpf   Bacteria, UA RARE (A) NONE SEEN   Squamous Epithelial / LPF 0-5 0 - 5   Mucus PRESENT    Hyaline Casts, UA PRESENT     Comment: Performed at [facility], [address], [place], [place] [address]  Urine Culture     Status: Abnormal (Preliminary  result)   Collection Time: 10/13/17  5:24 PM  Result Value Ref Range   Specimen Description      URINE, RANDOM Performed at [facility], [address], [place], [place] [address]    Special Requests      NONE Performed at [facility], [address], [place], [place] [address]    Culture 80,000 COLONIES/mL KLEBSIELLA PNEUMONIAE (A)    Report Status PENDING   Glucose, capillary  Status: Abnormal   Collection Time: 10/13/17  5:26 PM  Result Value Ref Range   Glucose-Capillary >600 (HH) 65 - 99 mg/dL  Lactic acid, plasma     Status: Abnormal   Collection Time: 10/13/17  5:29 PM  Result Value Ref Range   Lactic Acid, Venous 9.2 (HH) 0.5 - 1.9 mmol/L    Comment: CRITICAL RESULT CALLED TO, READ BACK BY AND VERIFIED WITH AMY COHEN 10/13/17 1810 KLW Performed at [facility], [address] [place], [place] [address]   Comprehensive metabolic panel     Status: Abnormal   Collection Time: 10/13/17  5:29 PM  Result Value Ref Range   Sodium 133 (L) 135 - 145 mmol/L   Potassium 4.1 3.5 - 5.1 mmol/L   Chloride 96 (L) 101 - 111 mmol/L   CO2 <7 (L) 22 - 32 mmol/L    Comment: CRITICAL RESULT CALLED TO, READ BACK BY AND VERIFIED WITH AMY COHEN 10/13/17 1800 KLW    Glucose, Bld 765 (HH) 65 - 99 mg/dL    Comment: CRITICAL RESULT CALLED TO, READ BACK BY AND VERIFIED WITH AMY COHEN 10/13/17 1800 KLW    BUN 12 6 - 20 mg/dL   Creatinine, Ser 1.58 (H) 0.61 - 1.24 mg/dL   Calcium 7.8 (L) 8.9 - 10.3 mg/dL   Total Protein 5.8 (L) 6.5 - 8.1 g/dL   Albumin 2.6 (L) 3.5 - 5.0 g/dL   AST 32 15 - 41 U/L   ALT <5 (L) 17 - 63 U/L   Alkaline Phosphatase 149 (H) 38 - 126 U/L   Total Bilirubin 1.9 (H) 0.3 - 1.2 mg/dL   GFR calc non Af Amer 45 (L) >60 mL/min   GFR calc Af Amer 52 (L) >60 mL/min    Comment: (NOTE) The eGFR has been calculated using the CKD EPI equation. This calculation has not been validated in all clinical situations. eGFR's persistently <60 mL/min  signify possible Chronic Kidney Disease.    Anion gap NOT CALCULATED 5 - 15    Comment: Performed at [facility], [address] [place], [place] [address]  Lipase, blood     Status: None   Collection Time: 10/13/17  5:29 PM  Result Value Ref Range   Lipase 20 11 - 51 U/L    Comment: Performed at [facility], [address] [place], [place] [address]  Troponin I     Status: Abnormal   Collection Time: 10/13/17  5:29 PM  Result Value Ref Range   Troponin I 0.03 (HH) <0.03 ng/mL    Comment: CRITICAL RESULT CALLED TO, READ BACK BY AND VERIFIED WITH AMY COHEN 10/13/17 1800 KLW Performed at [facility], [address] [place], [place] [address]   CBC WITH DIFFERENTIAL     Status: Abnormal   Collection Time: 10/13/17  5:29 PM  Result Value Ref Range   WBC 11.2 (H) 3.8 - 10.6 K/uL   RBC 2.85 (L) 4.40 - 5.90 MIL/uL   Hemoglobin 10.0 (L) 13.0 - 18.0 g/dL    Comment: RESULT REPEATED AND VERIFIED   HCT 33.2 (L) 40.0 - 52.0 %   MCV 116.4 (H) 80.0 - 100.0 fL   MCH 35.0 (H) 26.0 - 34.0 pg   MCHC 30.1 (L) 32.0 - 36.0 g/dL   RDW 16.2 (H) 11.5 - 14.5 %   Platelets 668 (H) 150 - 440 K/uL   Neutrophils Relative % 68 %   Neutro Abs 7.6 (H) 1.4 - 6.5 K/uL  Lymphocytes Relative 22 %   Lymphs Abs 2.5 1.0 - 3.6 K/uL   Monocytes Relative 9 %   Monocytes Absolute 1.0 0.2 - 1.0 K/uL   Eosinophils Relative 0 %   Eosinophils Absolute 0.0 0 - 0.7 K/uL   Basophils Relative 1 %   Basophils Absolute 0.1 0 - 0.1 K/uL    Comment: Performed at [facility], [address], [place], [place] [address]  Blood Culture (routine x 2)     Status: None (Preliminary result)   Collection Time: 10/13/17  5:29 PM  Result Value Ref Range   Specimen Description BLOOD RIGHT ANTECUBITAL    Special Requests      BOTTLES DRAWN AEROBIC AND ANAEROBIC Blood Culture adequate volume   Culture  Setup Time      Organism ID to follow GRAM NEGATIVE RODS ANAEROBIC BOTTLE  ONLY CRITICAL RESULT CALLED TO, READ BACK BY AND VERIFIED WITH: HANK ZOMPA ON 10/15/17 AT 28 QSD Performed at [facility], [address] [place], [place] [address]    Culture GRAM NEGATIVE RODS    Report Status PENDING   Blood Culture ID Panel (Reflexed)     Status: Abnormal   Collection Time: 10/13/17  5:29 PM  Result Value Ref Range   Enterococcus species NOT DETECTED NOT DETECTED   Listeria monocytogenes NOT DETECTED NOT DETECTED   Staphylococcus species NOT DETECTED NOT DETECTED   Staphylococcus aureus NOT DETECTED NOT DETECTED   Streptococcus species NOT DETECTED NOT DETECTED   Streptococcus agalactiae NOT DETECTED NOT DETECTED   Streptococcus pneumoniae NOT DETECTED NOT DETECTED   Streptococcus pyogenes NOT DETECTED NOT DETECTED   Acinetobacter baumannii NOT DETECTED NOT DETECTED   Enterobacteriaceae species DETECTED (A) NOT DETECTED    Comment: Enterobacteriaceae represent a large family of gram-negative bacteria, not a single organism. CRITICAL RESULT CALLED TO, READ BACK BY AND VERIFIED WITH: HANK ZOMPA ON 10/15/17 AT 1358 QSD    Enterobacter cloacae complex NOT DETECTED NOT DETECTED   Escherichia coli NOT DETECTED NOT DETECTED   Klebsiella oxytoca NOT DETECTED NOT DETECTED   Klebsiella pneumoniae DETECTED (A) NOT DETECTED    Comment: CRITICAL RESULT CALLED TO, READ BACK BY AND VERIFIED WITH: HANK ZOMPA ON 10/15/17 AT 1358 QSD    Proteus species NOT DETECTED NOT DETECTED   Serratia marcescens NOT DETECTED NOT DETECTED   Carbapenem resistance NOT DETECTED NOT DETECTED   Haemophilus influenzae NOT DETECTED NOT DETECTED   Neisseria meningitidis NOT DETECTED NOT DETECTED   Pseudomonas aeruginosa NOT DETECTED NOT DETECTED   Candida albicans NOT DETECTED NOT DETECTED   Candida glabrata NOT DETECTED NOT DETECTED   Candida krusei NOT DETECTED NOT DETECTED   Candida parapsilosis NOT DETECTED NOT DETECTED   Candida tropicalis NOT DETECTED NOT DETECTED    Comment:  Performed at [facility], [address] [place], [place] [address]  Blood gas, venous     Status: Abnormal (Preliminary result)   Collection Time: 10/13/17  5:30 PM  Result Value Ref Range   pH, Ven 6.91 (LL) 7.250 - 7.430    Comment: CRITICAL RESULT CALLED TO, READ BACK BY AND VERIFIED WITH: DR Jacqualine Code 226333 1745 FD    pCO2, Ven 23 (L) 44.0 - 60.0 mmHg   pO2, Ven PENDING 32.0 - 45.0 mmHg   Bicarbonate 4.6 (L) 20.0 - 28.0 mmol/L   Acid-base deficit 26.7 (H) 0.0 - 2.0 mmol/L   O2 Saturation UNABLE TO CALCULATE. %   Patient temperature 37.0    Collection  site VEIN    Sample type VENOUS     Comment: Performed at [facility], [address] [place], [place] [address]  Blood Culture (routine x 2)     Status: None (Preliminary result)   Collection Time: 10/13/17  5:34 PM  Result Value Ref Range   Specimen Description BLOOD BLOOD RIGHT FOREARM    Special Requests      BOTTLES DRAWN AEROBIC AND ANAEROBIC Blood Culture adequate volume   Culture      NO GROWTH 2 DAYS Performed at [facility], [address] [place], [place] [address]    Report Status PENDING   Ammonia     Status: Abnormal   Collection Time: 10/13/17  6:25 PM  Result Value Ref Range   Ammonia 107 (H) 9 - 35 umol/L    Comment: Performed at [facility], [address] [place], [place] [address]  RPR     Status: None   Collection Time: 10/13/17  6:25 PM  Result Value Ref Range   RPR Ser Ql Non Reactive Non Reactive    Comment: (NOTE) Performed At: [facility] [address] [place], [place] [id number] Rush Farmer MD [phone] Performed at [facility], [address] [place], [place] [address]   Basic metabolic panel     Status: Abnormal   Collection Time: 10/13/17  6:41 PM  Result Value Ref Range   Sodium 136 135 - 145 mmol/L   Potassium 4.0 3.5 - 5.1 mmol/L   Chloride 103 101 - 111 mmol/L   CO2 <7 (L) 22 - 32 mmol/L    Comment: CRITICAL  RESULT CALLED TO, READ BACK BY AND VERIFIED WITH NICOLE COOK 10/13/17 1910 KLW    Glucose, Bld 732 (HH) 65 - 99 mg/dL    Comment: CRITICAL RESULT CALLED TO, READ BACK BY AND VERIFIED WITH NICOLE COOK 10/13/17 1910 KLW    BUN 13 6 - 20 mg/dL   Creatinine, Ser 1.46 (H) 0.61 - 1.24 mg/dL   Calcium 6.7 (L) 8.9 - 10.3 mg/dL   GFR calc non Af Amer 50 (L) >60 mL/min   GFR calc Af Amer 58 (L) >60 mL/min    Comment: (NOTE) The eGFR has been calculated using the CKD EPI equation. This calculation has not been validated in all clinical situations. eGFR's persistently <60 mL/min signify possible Chronic Kidney Disease.    Anion gap NOT CALCULATED 5 - 15    Comment: Performed at [facility], [place],  [address]  Lactic acid, plasma     Status: Abnormal   Collection Time: 10/13/17  6:41 PM  Result Value Ref Range   Lactic Acid, Venous 6.8 (HH) 0.5 - 1.9 mmol/L    Comment: CRITICAL RESULT CALLED TO, READ BACK BY AND VERIFIED WITH AMY COHEN 10/13/17 1922 KLW Performed at [facility], [address] [place],  [address]   Glucose, capillary     Status: Abnormal   Collection Time: 10/13/17  6:48 PM  Result Value Ref Range   Glucose-Capillary 543 (HH) 65 - 99 mg/dL  MRSA PCR Screening     Status: Abnormal   Collection Time: 10/13/17  8:00 PM  Result Value Ref Range   MRSA by PCR POSITIVE (A) NEGATIVE    Comment:        The GeneXpert MRSA Assay (FDA approved for NASAL specimens only), is one component of a comprehensive MRSA colonization surveillance program. It is not intended to diagnose MRSA  infection nor to guide or monitor treatment for MRSA infections. RESULT CALLED TO, READ BACK BY AND VERIFIED WITH: DELL HOPKINS ON 10/13/17 AT 2228 JAG Performed at [facility], [address] [place], [place] [address]   Glucose, capillary     Status: Abnormal   Collection Time: 10/13/17  8:03 PM  Result Value Ref Range    Glucose-Capillary 552 (HH) 65 - 99 mg/dL   Comment 1 Notify RN   Lactic acid, plasma     Status: Abnormal   Collection Time: 10/13/17  8:13 PM  Result Value Ref Range   Lactic Acid, Venous 7.1 (HH) 0.5 - 1.9 mmol/L    Comment: CRITICAL RESULT CALLED TO, READ BACK BY AND VERIFIED WITH DALE HOPKINS AT 2127 10/13/2017.  TFK Performed at [facility], [address] [place], [place] [address]   Ethanol     Status: None   Collection Time: 10/13/17  8:13 PM  Result Value Ref Range   Alcohol, Ethyl (B) <10 <10 mg/dL    Comment: (NOTE) Lowest detectable limit for serum alcohol is 10 mg/dL. For medical purposes only. Performed at [facility], [address] [place], [place] [address]   Basic metabolic panel     Status: Abnormal   Collection Time: 10/13/17  8:13 PM  Result Value Ref Range   Sodium 137 135 - 145 mmol/L   Potassium 3.9 3.5 - 5.1 mmol/L   Chloride 104 101 - 111 mmol/L   CO2 <7 (L) 22 - 32 mmol/L   Glucose, Bld 681 (HH) 65 - 99 mg/dL    Comment: CRITICAL RESULT CALLED TO, READ BACK BY AND VERIFIED WITH DALE HOPKINS AT 2127 10/13/2017.  TFK    BUN 12 6 - 20 mg/dL   Creatinine, Ser 1.35 (H) 0.61 - 1.24 mg/dL   Calcium 6.8 (L) 8.9 - 10.3 mg/dL   GFR calc non Af Amer 55 (L) >60 mL/min   GFR calc Af Amer >60 >60 mL/min    Comment: (NOTE) The eGFR has been calculated using the CKD EPI equation. This calculation has not been validated in all clinical situations. eGFR's persistently <60 mL/min signify possible Chronic Kidney Disease.    Anion gap NOT CALCULATED 5 - 15    Comment: Performed at [facility], [address] [place], [place] [address]  CBC     Status: Abnormal   Collection Time: 10/13/17  8:13 PM  Result Value Ref Range   WBC 13.0 (H) 3.8 - 10.6 K/uL   RBC 2.42 (L) 4.40 - 5.90 MIL/uL   Hemoglobin 8.6 (L) 13.0 - 18.0 g/dL   HCT 27.5 (L) 40.0 - 52.0 %   MCV 113.8 (H) 80.0 - 100.0 fL   MCH 35.7 (H) 26.0 - 34.0 pg   MCHC 31.4  (L) 32.0 - 36.0 g/dL   RDW 15.6 (H) 11.5 - 14.5 %   Platelets 480 (H) 150 - 440 K/uL    Comment: Performed at [facility], [address] [place], [place] [address]  Troponin I     Status: Abnormal   Collection Time: 10/13/17  8:13 PM  Result Value Ref Range   Troponin I 0.08 (HH) <0.03 ng/mL    Comment: CRITICAL RESULT CALLED TO, READ BACK BY AND VERIFIED WITH DALE HOPKINS AT 2127 10/13/2017.  TFK Performed at [facility], [address] [place], [place] [address]   Hemoglobin A1c     Status: Abnormal   Collection Time: 10/13/17  8:13  PM  Result Value Ref Range   Hgb A1c MFr Bld 7.0 (H) 4.8 - 5.6 %    Comment: (NOTE) Pre diabetes:          5.7%-6.4% Diabetes:              >6.4% Glycemic control for   <7.0% adults with diabetes    Mean Plasma Glucose 154.2 mg/dL    Comment: Performed at [facility], [place] [address], [place], [place] [address]  Procalcitonin - Baseline     Status: None   Collection Time: 10/13/17  8:13 PM  Result Value Ref Range   Procalcitonin 0.45 ng/mL    Comment:        Interpretation: PCT (Procalcitonin) <= 0.5 ng/mL: Systemic infection (sepsis) is not likely. Local bacterial infection is possible. (NOTE)       Sepsis PCT Algorithm           Lower Respiratory Tract                                      Infection PCT Algorithm    ----------------------------     ----------------------------         PCT < 0.25 ng/mL                PCT < 0.10 ng/mL         Strongly encourage             Strongly discourage   discontinuation of antibiotics    initiation of antibiotics    ----------------------------     -----------------------------       PCT 0.25 - 0.50 ng/mL            PCT 0.10 - 0.25 ng/mL               OR       >80% decrease in PCT            Discourage initiation of                                            antibiotics      Encourage discontinuation           of antibiotics    ----------------------------      -----------------------------         PCT >= 0.50 ng/mL              PCT 0.26 - 0.50 ng/mL               AND        <80% decrease in PCT             Encourage initiation of                                             antibiotics       Encourage continuation           of antibiotics    ----------------------------     -----------------------------        PCT >= 0.50 ng/mL                  PCT >  0.50 ng/mL               AND         increase in PCT                  Strongly encourage                                      initiation of antibiotics    Strongly encourage escalation           of antibiotics                                     -----------------------------                                           PCT <= 0.25 ng/mL                                                 OR                                        > 80% decrease in PCT                                     Discontinue / Do not initiate                                             antibiotics Performed at [facility], [address] [place], [place] [address]   Glucose, capillary     Status: Abnormal   Collection Time: 10/13/17  9:09 PM  Result Value Ref Range   Glucose-Capillary 582 (HH) 65 - 99 mg/dL   Comment 1 Notify RN   Glucose, capillary     Status: Abnormal   Collection Time: 10/13/17 10:17 PM  Result Value Ref Range   Glucose-Capillary 491 (H) 65 - 99 mg/dL  Glucose, capillary     Status: Abnormal   Collection Time: 10/13/17 11:22 PM  Result Value Ref Range   Glucose-Capillary 441 (H) 65 - 99 mg/dL  Lactic acid, plasma     Status: Abnormal   Collection Time: 10/13/17 11:41 PM  Result Value Ref Range   Lactic Acid, Venous 4.2 (HH) 0.5 - 1.9 mmol/L    Comment: CRITICAL RESULT CALLED TO, READ BACK BY AND VERIFIED WITH MICHELLE WILLIAMS '@0021'  10/14/17 [facility] Performed at [facility], [address] [place], [place] [address]   Glucose, capillary     Status: Abnormal   Collection Time:  10/14/17 12:09 AM  Result Value Ref Range   Glucose-Capillary 415 (H) 65 - 99 mg/dL   Comment 1 Notify RN   Glucose, capillary     Status: Abnormal   Collection Time: 10/14/17  1:00 AM  Result  Value Ref Range   Glucose-Capillary 299 (H) 65 - 99 mg/dL   Comment 1 Notify RN   Glucose, capillary     Status: Abnormal   Collection Time: 10/14/17  2:09 AM  Result Value Ref Range   Glucose-Capillary 263 (H) 65 - 99 mg/dL   Comment 1 Notify RN   Troponin I     Status: Abnormal   Collection Time: 10/14/17  2:44 AM  Result Value Ref Range   Troponin I 0.20 (HH) <0.03 ng/mL    Comment: CRITICAL VALUE NOTED. VALUE IS CONSISTENT WITH PREVIOUSLY REPORTED/CALLED VALUE / FLC Performed at [facility], [address] [place], [place] [address]   Procalcitonin     Status: None   Collection Time: 10/14/17  2:44 AM  Result Value Ref Range   Procalcitonin 1.46 ng/mL    Comment:        Interpretation: PCT > 0.5 ng/mL and <= 2 ng/mL: Systemic infection (sepsis) is possible, but other conditions are known to elevate PCT as well. (NOTE)       Sepsis PCT Algorithm           Lower Respiratory Tract                                      Infection PCT Algorithm    ----------------------------     ----------------------------         PCT < 0.25 ng/mL                PCT < 0.10 ng/mL         Strongly encourage             Strongly discourage   discontinuation of antibiotics    initiation of antibiotics    ----------------------------     -----------------------------       PCT 0.25 - 0.50 ng/mL            PCT 0.10 - 0.25 ng/mL               OR       >80% decrease in PCT            Discourage initiation of                                            antibiotics      Encourage discontinuation           of antibiotics    ----------------------------     -----------------------------         PCT >= 0.50 ng/mL              PCT 0.26 - 0.50 ng/mL                AND       <80% decrease in PCT              Encourage initiation of                                             antibiotics       Encourage continuation           of antibiotics    ----------------------------     -----------------------------  PCT >= 0.50 ng/mL                  PCT > 0.50 ng/mL               AND         increase in PCT                  Strongly encourage                                      initiation of antibiotics    Strongly encourage escalation           of antibiotics                                     -----------------------------                                           PCT <= 0.25 ng/mL                                                 OR                                        > 80% decrease in PCT                                     Discontinue / Do not initiate                                             antibiotics Performed at [facility], [address] [place], [place] [address]   Basic metabolic panel     Status: Abnormal   Collection Time: 10/14/17  2:44 AM  Result Value Ref Range   Sodium 140 135 - 145 mmol/L   Potassium 2.6 (LL) 3.5 - 5.1 mmol/L    Comment: CRITICAL RESULT CALLED TO, READ BACK BY AND VERIFIED WITH DALE HOPKINS '@0521'  10/14/17 FLC    Chloride 112 (H) 101 - 111 mmol/L   CO2 12 (L) 22 - 32 mmol/L   Glucose, Bld 243 (H) 65 - 99 mg/dL   BUN 11 6 - 20 mg/dL   Creatinine, Ser 1.20 0.61 - 1.24 mg/dL   Calcium 6.5 (L) 8.9 - 10.3 mg/dL   GFR calc non Af Amer >60 >60 mL/min   GFR calc Af Amer >60 >60 mL/min    Comment: (NOTE) The eGFR has been calculated using the CKD EPI equation. This calculation has not been validated in all clinical situations. eGFR's persistently <60 mL/min signify possible Chronic Kidney Disease.    Anion gap 16 (H) 5 - 15    Comment: Performed at [facility], [address] [place], [place] [address]  Glucose, capillary  Status: Abnormal   Collection Time: 10/14/17  3:00 AM  Result Value Ref Range    Glucose-Capillary 213 (H) 65 - 99 mg/dL   Comment 1 Notify RN   Glucose, capillary     Status: Abnormal   Collection Time: 10/14/17  4:05 AM  Result Value Ref Range   Glucose-Capillary 181 (H) 65 - 99 mg/dL   Comment 1 Notify RN   Glucose, capillary     Status: Abnormal   Collection Time: 10/14/17  4:58 AM  Result Value Ref Range   Glucose-Capillary 161 (H) 65 - 99 mg/dL   Comment 1 Notify RN   Urine Drug Screen, Qualitative ([facility] only)     Status: Abnormal   Collection Time: 10/14/17  5:17 AM  Result Value Ref Range   Tricyclic, Ur Screen NONE DETECTED NONE DETECTED   Amphetamines, Ur Screen NONE DETECTED NONE DETECTED   MDMA (Ecstasy)Ur Screen NONE DETECTED NONE DETECTED   Cocaine Metabolite,Ur [place] NONE DETECTED NONE DETECTED   Opiate, Ur Screen POSITIVE (A) NONE DETECTED   Phencyclidine (PCP) Ur S NONE DETECTED NONE DETECTED   Cannabinoid 50 Ng, Ur [place] NONE DETECTED NONE DETECTED   Barbiturates, Ur Screen NONE DETECTED NONE DETECTED   Benzodiazepine, Ur Scrn NONE DETECTED NONE DETECTED   Methadone Scn, Ur NONE DETECTED NONE DETECTED    Comment: (NOTE) Tricyclics + metabolites, urine    Cutoff 1000 ng/mL Amphetamines + metabolites, urine  Cutoff 1000 ng/mL MDMA (Ecstasy), urine              Cutoff 500 ng/mL Cocaine Metabolite, urine          Cutoff 300 ng/mL Opiate + metabolites, urine        Cutoff 300 ng/mL Phencyclidine (PCP), urine         Cutoff 25 ng/mL Cannabinoid, urine                 Cutoff 50 ng/mL Barbiturates + metabolites, urine  Cutoff 200 ng/mL Benzodiazepine, urine              Cutoff 200 ng/mL Methadone, urine                   Cutoff 300 ng/mL The urine drug screen provides only a preliminary, unconfirmed analytical test result and should not be used for non-medical purposes. Clinical consideration and professional judgment should be applied to any positive drug screen result due to possible interfering substances. A more specific alternate chemical  method must be used in order to obtain a confirmed analytical result. Gas chromatography / mass spectrometry (GC/MS) is the preferred confirmat ory method. Performed at [facility], [address] [place], [place] [address]   Glucose, capillary     Status: Abnormal   Collection Time: 10/14/17  5:55 AM  Result Value Ref Range   Glucose-Capillary 116 (H) 65 - 99 mg/dL   Comment 1 Notify RN   Glucose, capillary     Status: Abnormal   Collection Time: 10/14/17  6:57 AM  Result Value Ref Range   Glucose-Capillary 117 (H) 65 - 99 mg/dL   Comment 1 Notify RN   Blood gas, arterial     Status: Abnormal   Collection Time: 10/14/17  7:39 AM  Result Value Ref Range   FIO2 0.21    pH, Arterial 7.56 (H) 7.350 - 7.450   pCO2 arterial 25 (L) 32.0 - 48.0 mmHg   pO2, Arterial 111 (H) 83.0 - 108.0 mmHg   Bicarbonate  22.4 20.0 - 28.0 mmol/L   Acid-Base Excess 0.5 0.0 - 2.0 mmol/L   O2 Saturation 98.9 %   Patient temperature 37.0    Collection site RIGHT RADIAL    Sample type ARTERIAL DRAW    Allens test (pass/fail) PASS PASS    Comment: Performed at [facility], [address] [place], [place] [address]  Glucose, capillary     Status: None   Collection Time: 10/14/17  7:52 AM  Result Value Ref Range   Glucose-Capillary 98 65 - 99 mg/dL  CBC with Differential/Platelet     Status: Abnormal   Collection Time: 10/14/17  8:04 AM  Result Value Ref Range   WBC 11.0 (H) 3.8 - 10.6 K/uL   RBC 2.12 (L) 4.40 - 5.90 MIL/uL    Comment: CORRECTED ON 06/06 AT 0827: PREVIOUSLY REPORTED AS 2.10   Hemoglobin 7.3 (L) 13.0 - 18.0 g/dL   HCT 21.4 (L) 40.0 - 52.0 %   MCV 101.3 (H) 80.0 - 100.0 fL    Comment: CORRECTED ON 06/06 AT 0827: PREVIOUSLY REPORTED AS 101.9 RESULT REPEATED AND VERIFIED   MCH 34.5 (H) 26.0 - 34.0 pg    Comment: CORRECTED ON 06/06 AT 0827: PREVIOUSLY REPORTED AS 34.7   MCHC 34.0 32.0 - 36.0 g/dL   RDW 14.5 11.5 - 14.5 %   Platelets 434 150 - 440 K/uL     Comment: CORRECTED ON 06/06 AT 0827: PREVIOUSLY REPORTED AS 436   Neutrophils Relative % 80 %   Neutro Abs 8.9 (H) 1.4 - 6.5 K/uL   Lymphocytes Relative 12 %   Lymphs Abs 1.3 1.0 - 3.6 K/uL   Monocytes Relative 8 %   Monocytes Absolute 0.8 0.2 - 1.0 K/uL   Eosinophils Relative 0 %   Eosinophils Absolute 0.0 0 - 0.7 K/uL   Basophils Relative 0 %   Basophils Absolute 0.0 0 - 0.1 K/uL    Comment: Performed at [facility], [address] [place], [place] [address]  Basic metabolic panel     Status: Abnormal   Collection Time: 10/14/17  8:04 AM  Result Value Ref Range   Sodium 140 135 - 145 mmol/L   Potassium 2.4 (LL) 3.5 - 5.1 mmol/L    Comment: CRITICAL RESULT CALLED TO, READ BACK BY AND VERIFIED WITH TANNIALLY GIRALT AT 3500 ON 10/14/2017 JJB    Chloride 113 (H) 101 - 111 mmol/L   CO2 21 (L) 22 - 32 mmol/L   Glucose, Bld 102 (H) 65 - 99 mg/dL   BUN 10 6 - 20 mg/dL   Creatinine, Ser 0.90 0.61 - 1.24 mg/dL   Calcium 6.4 (LL) 8.9 - 10.3 mg/dL    Comment: CRITICAL RESULT CALLED TO, READ BACK BY AND VERIFIED WITH TANNIALLY GIRALT AT 9381 ON 10/14/2017 JJB    GFR calc non Af Amer >60 >60 mL/min   GFR calc Af Amer >60 >60 mL/min    Comment: (NOTE) The eGFR has been calculated using the CKD EPI equation. This calculation has not been validated in all clinical situations. eGFR's persistently <60 mL/min signify possible Chronic Kidney Disease.    Anion gap 6 5 - 15    Comment: Performed at [facility], [address] [place],  [address]  Magnesium     Status: Abnormal   Collection Time: 10/14/17  8:04 AM  Result Value Ref Range   Magnesium 0.9 (LL) 1.7 - 2.4 mg/dL    Comment: CRITICAL RESULT CALLED TO, READ BACK  BY AND VERIFIED WITH TANNIALLY GIRALT ON 10/14/17 AT 0910 QSD Performed at [facility], [address] [place], [place] [address]   Phosphorus     Status: Abnormal   Collection Time: 10/14/17  8:04 AM  Result Value Ref Range    Phosphorus 1.3 (L) 2.5 - 4.6 mg/dL    Comment: Performed at [facility], [address] [place], [place] [address]  Glucose, capillary     Status: Abnormal   Collection Time: 10/14/17  9:14 AM  Result Value Ref Range   Glucose-Capillary 102 (H) 65 - 99 mg/dL  Glucose, capillary     Status: None   Collection Time: 10/14/17 10:30 AM  Result Value Ref Range   Glucose-Capillary 98 65 - 99 mg/dL  Glucose, capillary     Status: Abnormal   Collection Time: 10/14/17 11:59 AM  Result Value Ref Range   Glucose-Capillary 115 (H) 65 - 99 mg/dL  Basic metabolic panel     Status: Abnormal   Collection Time: 10/14/17  4:22 PM  Result Value Ref Range   Sodium 140 135 - 145 mmol/L   Potassium 2.9 (L) 3.5 - 5.1 mmol/L   Chloride 112 (H) 101 - 111 mmol/L   CO2 21 (L) 22 - 32 mmol/L   Glucose, Bld 98 65 - 99 mg/dL   BUN 9 6 - 20 mg/dL   Creatinine, Ser 0.88 0.61 - 1.24 mg/dL   Calcium 6.9 (L) 8.9 - 10.3 mg/dL   GFR calc non Af Amer >60 >60 mL/min   GFR calc Af Amer >60 >60 mL/min    Comment: (NOTE) The eGFR has been calculated using the CKD EPI equation. This calculation has not been validated in all clinical situations. eGFR's persistently <60 mL/min signify possible Chronic Kidney Disease.    Anion gap 7 5 - 15    Comment: Performed at [facility], [address] [place], [place] [address]  Vitamin B12     Status: None   Collection Time: 10/14/17  4:22 PM  Result Value Ref Range   Vitamin B-12 734 180 - 914 pg/mL    Comment: (NOTE) This assay is not validated for testing neonatal or myeloproliferative syndrome specimens for Vitamin B12 levels. Performed at [facility], [place] [address] [place], [place] [address]   Folate     Status: None   Collection Time: 10/14/17  4:22 PM  Result Value Ref Range   Folate 30.0 >5.9 ng/mL    Comment: Performed at [facility], [address] [place], [place] [address]  Iron and TIBC     Status: Abnormal    Collection Time: 10/14/17  4:22 PM  Result Value Ref Range   Iron 25 (L) 45 - 182 ug/dL   TIBC NOT CALCULATED 250 - 450 ug/dL   Saturation Ratios NOT CALCULATED 17.9 - 39.5 %   UIBC NOT CALCULATED ug/dL    Comment: Performed at [facility], [address], [place], [place] [address]  Ferritin     Status: Abnormal   Collection Time: 10/14/17  4:22 PM  Result Value Ref Range   Ferritin 344 (H) 24 - 336 ng/mL    Comment: Performed at [facility], [address] [place], [place] [address]  Reticulocytes     Status: Abnormal   Collection Time: 10/14/17  4:22 PM  Result Value Ref Range   Retic Ct Pct 1.1 0.4 - 3.1 %   RBC. 2.17 (L) 4.40 - 5.90  MIL/uL   Retic Count, Absolute 23.9 19.0 - 183.0 K/uL    Comment: Performed at [facility], [address] [place], [place] [address]  Glucose, capillary     Status: None   Collection Time: 10/14/17  5:32 PM  Result Value Ref Range   Glucose-Capillary 72 65 - 99 mg/dL  Basic metabolic panel     Status: Abnormal   Collection Time: 10/14/17  7:36 PM  Result Value Ref Range   Sodium 140 135 - 145 mmol/L   Potassium 2.8 (L) 3.5 - 5.1 mmol/L   Chloride 112 (H) 101 - 111 mmol/L   CO2 21 (L) 22 - 32 mmol/L   Glucose, Bld 63 (L) 65 - 99 mg/dL   BUN 8 6 - 20 mg/dL   Creatinine, Ser 0.80 0.61 - 1.24 mg/dL   Calcium 6.9 (L) 8.9 - 10.3 mg/dL   GFR calc non Af Amer >60 >60 mL/min   GFR calc Af Amer >60 >60 mL/min    Comment: (NOTE) The eGFR has been calculated using the CKD EPI equation. This calculation has not been validated in all clinical situations. eGFR's persistently <60 mL/min signify possible Chronic Kidney Disease.    Anion gap 7 5 - 15    Comment: Performed at [facility], [address] [place], [place] [address]  Magnesium     Status: Abnormal   Collection Time: 10/14/17  7:36 PM  Result Value Ref Range   Magnesium 1.6 (L) 1.7 - 2.4 mg/dL    Comment: Performed at [facility],  [address] [place] [address]  Phosphorus     Status: Abnormal   Collection Time: 10/14/17  7:36 PM  Result Value Ref Range   Phosphorus 1.4 (L) 2.5 - 4.6 mg/dL    Comment: Performed at [facility], [address] [place], [place] [address]  Glucose, capillary     Status: Abnormal   Collection Time: 10/14/17  8:54 PM  Result Value Ref Range   Glucose-Capillary 40 (LL) 65 - 99 mg/dL  Glucose, capillary     Status: None   Collection Time: 10/14/17 10:20 PM  Result Value Ref Range   Glucose-Capillary 88 65 - 99 mg/dL  Procalcitonin     Status: None   Collection Time: 10/15/17  5:27 AM  Result Value Ref Range   Procalcitonin 1.04 ng/mL    Comment:        Interpretation: PCT > 0.5 ng/mL and <= 2 ng/mL: Systemic infection (sepsis) is possible, but other conditions are known to elevate PCT as well. (NOTE)       Sepsis PCT Algorithm           Lower Respiratory Tract                                      Infection PCT Algorithm    ----------------------------     ----------------------------         PCT < 0.25 ng/mL                PCT < 0.10 ng/mL         Strongly encourage             Strongly discourage   discontinuation of antibiotics    initiation of antibiotics    ----------------------------     -----------------------------       PCT 0.25 - 0.50  ng/mL            PCT 0.10 - 0.25 ng/mL               OR       >80% decrease in PCT            Discourage initiation of                                            antibiotics      Encourage discontinuation           of antibiotics    ----------------------------     -----------------------------         PCT >= 0.50 ng/mL              PCT 0.26 - 0.50 ng/mL                AND       <80% decrease in PCT             Encourage initiation of                                             antibiotics       Encourage continuation           of antibiotics    ----------------------------      -----------------------------        PCT >= 0.50 ng/mL                  PCT > 0.50 ng/mL               AND         increase in PCT                  Strongly encourage                                      initiation of antibiotics    Strongly encourage escalation           of antibiotics                                     -----------------------------                                           PCT <= 0.25 ng/mL                                                 OR                                        > 80% decrease in PCT  Discontinue / Do not initiate                                             antibiotics Performed at [facility], [address] [place], [place] [address]   CBC     Status: Abnormal   Collection Time: 10/15/17  5:27 AM  Result Value Ref Range   WBC 6.5 3.8 - 10.6 K/uL   RBC 2.11 (L) 4.40 - 5.90 MIL/uL   Hemoglobin 7.4 (L) 13.0 - 18.0 g/dL   HCT 21.7 (L) 40.0 - 52.0 %   MCV 103.2 (H) 80.0 - 100.0 fL   MCH 35.0 (H) 26.0 - 34.0 pg   MCHC 33.9 32.0 - 36.0 g/dL   RDW 14.9 (H) 11.5 - 14.5 %   Platelets 389 150 - 440 K/uL    Comment: Performed at [facility], [address] [place], [place] [address]  Basic metabolic panel     Status: Abnormal   Collection Time: 10/15/17  5:27 AM  Result Value Ref Range   Sodium 136 135 - 145 mmol/L   Potassium >7.5 (HH) 3.5 - 5.1 mmol/L    Comment: CRITICAL RESULT CALLED TO, READ BACK BY AND VERIFIED WITH MICHELLE ROGERS 10/15/17 AT 0735 [facility] RESULT CONFIRMED BY MANUAL DILUTION [facility]    Chloride 112 (H) 101 - 111 mmol/L   CO2 18 (L) 22 - 32 mmol/L   Glucose, Bld 194 (H) 65 - 99 mg/dL   BUN 6 6 - 20 mg/dL   Creatinine, Ser 0.59 (L) 0.61 - 1.24 mg/dL   Calcium 6.8 (L) 8.9 - 10.3 mg/dL   GFR calc non Af Amer >60 >60 mL/min   GFR calc Af Amer >60 >60 mL/min    Comment: (NOTE) The eGFR has been calculated using the CKD EPI equation. This calculation has not been validated in  all clinical situations. eGFR's persistently <60 mL/min signify possible Chronic Kidney Disease.    Anion gap 6 5 - 15    Comment: Performed at [facility], [address] [place], [place] [address]  Magnesium     Status: Abnormal   Collection Time: 10/15/17  5:27 AM  Result Value Ref Range   Magnesium 1.5 (L) 1.7 - 2.4 mg/dL    Comment: Performed at [facility], [address] [place], [place] [address]  Phosphorus     Status: None   Collection Time: 10/15/17  5:27 AM  Result Value Ref Range   Phosphorus 3.2 2.5 - 4.6 mg/dL    Comment: Performed at [facility], [address] [place], [place] [address]  Albumin     Status: Abnormal   Collection Time: 10/15/17  5:27 AM  Result Value Ref Range   Albumin 1.6 (L) 3.5 - 5.0 g/dL    Comment: Performed at [facility], [address] [place], [place] [address]  Glucose, capillary     Status: Abnormal   Collection Time: 10/15/17  8:17 AM  Result Value Ref Range   Glucose-Capillary 225 (H) 65 - 99 mg/dL   Comment 1 Notify RN    Comment 2 Document in Chart   Basic metabolic panel     Status: Abnormal   Collection Time: 10/15/17  8:29 AM  Result Value Ref Range   Sodium 136 135 - 145 mmol/L   Potassium 4.0 3.5 - 5.1  mmol/L   Chloride 108 101 - 111 mmol/L   CO2 19 (L) 22 - 32 mmol/L   Glucose, Bld 230 (H) 65 - 99 mg/dL   BUN 6 6 - 20 mg/dL   Creatinine, Ser 0.64 0.61 - 1.24 mg/dL   Calcium 7.1 (L) 8.9 - 10.3 mg/dL   GFR calc non Af Amer >60 >60 mL/min   GFR calc Af Amer >60 >60 mL/min    Comment: (NOTE) The eGFR has been calculated using the CKD EPI equation. This calculation has not been validated in all clinical situations. eGFR's persistently <60 mL/min signify possible Chronic Kidney Disease.    Anion gap 9 5 - 15    Comment: Performed at [facility], [address] [place], [place] [address]  Glucose, capillary     Status: Abnormal   Collection Time: 10/15/17 12:04  PM  Result Value Ref Range   Glucose-Capillary 156 (H) 65 - 99 mg/dL   Comment 1 Notify RN    Comment 2 Document in Chart    No components found for: ESR, C REACTIVE PROTEIN MICRO: Recent Results (from the past 720 hour(s))  Urine Culture     Status: Abnormal (Preliminary result)   Collection Time: 10/13/17  5:24 PM  Result Value Ref Range Status   Specimen Description   Final    URINE, RANDOM Performed at [facility], [address] [place], [place] [address]    Special Requests   Final    NONE Performed at [facility], [address], [place], [place] [address]    Culture 80,000 COLONIES/mL KLEBSIELLA PNEUMONIAE (A)  Final   Report Status PENDING  Incomplete  Blood Culture (routine x 2)     Status: None (Preliminary result)   Collection Time: 10/13/17  5:29 PM  Result Value Ref Range Status   Specimen Description BLOOD RIGHT ANTECUBITAL  Final   Special Requests   Final    BOTTLES DRAWN AEROBIC AND ANAEROBIC Blood Culture adequate volume   Culture  Setup Time   Final    Organism ID to follow GRAM NEGATIVE RODS ANAEROBIC BOTTLE ONLY CRITICAL RESULT CALLED TO, READ BACK BY AND VERIFIED WITH: HANK ZOMPA ON 10/15/17 AT 1 QSD Performed at [facility], [address] [place], [place] [address]    Culture GRAM NEGATIVE RODS  Final   Report Status PENDING  Incomplete  Blood Culture ID Panel (Reflexed)     Status: Abnormal   Collection Time: 10/13/17  5:29 PM  Result Value Ref Range Status   Enterococcus species NOT DETECTED NOT DETECTED Final   Listeria monocytogenes NOT DETECTED NOT DETECTED Final   Staphylococcus species NOT DETECTED NOT DETECTED Final   Staphylococcus aureus NOT DETECTED NOT DETECTED Final   Streptococcus species NOT DETECTED NOT DETECTED Final   Streptococcus agalactiae NOT DETECTED NOT DETECTED Final   Streptococcus pneumoniae NOT DETECTED NOT DETECTED Final   Streptococcus pyogenes NOT DETECTED NOT DETECTED Final    Acinetobacter baumannii NOT DETECTED NOT DETECTED Final   Enterobacteriaceae species DETECTED (A) NOT DETECTED Final    Comment: Enterobacteriaceae represent a large family of gram-negative bacteria, not a single organism. CRITICAL RESULT CALLED TO, READ BACK BY AND VERIFIED WITH: HANK ZOMPA ON 10/15/17 AT 1358 QSD    Enterobacter cloacae complex NOT DETECTED NOT DETECTED Final   Escherichia coli NOT DETECTED NOT DETECTED Final   Klebsiella oxytoca NOT DETECTED NOT DETECTED Final   Klebsiella pneumoniae DETECTED (A) NOT DETECTED Final  Comment: CRITICAL RESULT CALLED TO, READ BACK BY AND VERIFIED WITH: HANK ZOMPA ON 10/15/17 AT 1358 QSD    Proteus species NOT DETECTED NOT DETECTED Final   Serratia marcescens NOT DETECTED NOT DETECTED Final   Carbapenem resistance NOT DETECTED NOT DETECTED Final   Haemophilus influenzae NOT DETECTED NOT DETECTED Final   Neisseria meningitidis NOT DETECTED NOT DETECTED Final   Pseudomonas aeruginosa NOT DETECTED NOT DETECTED Final   Candida albicans NOT DETECTED NOT DETECTED Final   Candida glabrata NOT DETECTED NOT DETECTED Final   Candida krusei NOT DETECTED NOT DETECTED Final   Candida parapsilosis NOT DETECTED NOT DETECTED Final   Candida tropicalis NOT DETECTED NOT DETECTED Final    Comment: Performed at [facility], [address] [place], [place] [address]  Blood Culture (routine x 2)     Status: None (Preliminary result)   Collection Time: 10/13/17  5:34 PM  Result Value Ref Range Status   Specimen Description BLOOD BLOOD RIGHT FOREARM  Final   Special Requests   Final    BOTTLES DRAWN AEROBIC AND ANAEROBIC Blood Culture adequate volume   Culture   Final    NO GROWTH 2 DAYS Performed at [facility], [address], [place], [place] [address]    Report Status PENDING  Incomplete  MRSA PCR Screening     Status: Abnormal   Collection Time: 10/13/17  8:00 PM  Result Value Ref Range Status   MRSA by PCR POSITIVE (A)  NEGATIVE Final    Comment:        The GeneXpert MRSA Assay (FDA approved for NASAL specimens only), is one component of a comprehensive MRSA colonization surveillance program. It is not intended to diagnose MRSA infection nor to guide or monitor treatment for MRSA infections. RESULT CALLED TO, READ BACK BY AND VERIFIED WITH: DELL HOPKINS ON 10/13/17 AT 2228 JAG Performed at [facility], [address] [place], [place] [address]     IMAGING: Dg Chest 1 View  Result Date: 10/02/2017 CLINICAL DATA:  Generalized weakness, recent history of fall EXAM: CHEST  1 VIEW COMPARISON:  03/19/2015 FINDINGS: No focal consolidation or effusion. Scarring at the left lung base. Stable cardiomediastinal silhouette. No pneumothorax. Old left-sided rib fracture. Suspected acute displaced right second rib fracture. Mildly displaced fracture involving the proximal shaft of the left humerus. IMPRESSION: 1. Mild scarring at the left lung base.  No acute opacity 2. Acute mildly displaced fracture proximal shaft of the left humerus. Acute appearing displaced right second rib fracture. Electronically Signed   By: Donavan Foil M.D.   On: 10/02/2017 23:50   Ct Head Wo Contrast  Result Date: 10/03/2017 CLINICAL DATA:  Fall with bruising to the left eye EXAM: CT HEAD WITHOUT CONTRAST TECHNIQUE: Contiguous axial images were obtained from the base of the skull through the vertex without intravenous contrast. COMPARISON:  MRI 12/16/2016 FINDINGS: Brain: No acute territorial infarction, hemorrhage or intracranial mass is visualized. Moderate atrophy. Slight asymmetric enlargement of left anterior convexity extra-axial CSF density. Mild small vessel ischemic changes of the white matter. Nonenlarged ventricles. Vascular: No hyperdense vessels. Scattered calcifications at the carotid siphon Skull: Normal. Negative for fracture or focal lesion. Sinuses/Orbits: No acute finding. Other: Mild left periorbital soft tissue  swelling IMPRESSION: 1. No CT evidence for acute intracranial abnormality. 2. Atrophy and mild small vessel ischemic changes of the white matter 3. 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 Electronically Signed  By: Donavan Foil M.D.   On: 10/03/2017 00:04   Dg Chest Port 1 View  Result Date: 10/13/2017 CLINICAL DATA:  Altered mental status. EXAM: PORTABLE CHEST 1 VIEW COMPARISON:  10/02/2017 FINDINGS: Minimal scarring at the left base. No acute airspace opacities or effusions. Heart is normal size. No acute bony abnormality. IMPRESSION: No active disease. Electronically Signed   By: Rolm Baptise M.D.   On: 10/13/2017 18:12   Dg Shoulder Left  Result Date: 10/02/2017 CLINICAL DATA:  Generalized weakness since this morning. Patient fell yesterday and hit the left shoulder. Limited movement. EXAM: LEFT SHOULDER - 2+ VIEW COMPARISON:  None. FINDINGS: Comminuted fractures of the left humeral head and neck with transverse fracture line across the surgical neck and vertical fracture lines extending to the greater tuberosity. There is impaction of fracture fragments with lateral angulation of the distal humeral fracture fragment. No dislocation at the shoulder joint. Multiple tiny displaced fragments are visualized. Visualized clavicle, scapula, and acromion appear intact. IMPRESSION: Comminuted fractures of the left humeral head and neck with impaction of fracture fragments and lateral angulation of the distal fracture fragment. Electronically Signed   By: Lucienne Capers M.D.   On: 10/02/2017 23:47    Assessment:   John Reilly is a 62 y.o. male admitted with AMS, DKA, sepsis and found to have Klebsiella bacteremia from UTI. He denies urinary issues in the past, and states no prior UTI, bladder or prostate issues but his history is not completely reliable. He has a hx of multiple issues including PVD, Parkinson's, Dementia,  ETOH Abuse, Multiple Duodenal Ulcers, HTN, GERD, Diabetes Mellitus, and COPD  Recommendations Can change to ceftriaxone pending cx results. He reports a pcn allergy as a child. He has tolerated cephalosporins in past. Consider imaging of renal system with CT stone protocol to eval of pyelo or stones.  If organism is sensitive to oral regimen would rec a 10 day course  Thank you very much for allowing me to participate in the care of this patient. Please call with questions.   Cheral Marker. Ola Spurr, MD

## 2017-10-16 ENCOUNTER — Inpatient Hospital Stay: Payer: BLUE CROSS/BLUE SHIELD

## 2017-10-16 LAB — BASIC METABOLIC PANEL
ANION GAP: 6 (ref 5–15)
BUN: <5 mg/dL — ABNORMAL LOW (ref 6–20)
CALCIUM: 7.4 mg/dL — AB (ref 8.9–10.3)
CO2: 23 mmol/L (ref 22–32)
Chloride: 108 mmol/L (ref 101–111)
Creatinine, Ser: 0.58 mg/dL — ABNORMAL LOW (ref 0.61–1.24)
GFR calc non Af Amer: >60 mL/min (ref >60)
Glucose, Bld: 57 mg/dL — ABNORMAL LOW (ref 65–99)
Potassium: 4 mmol/L (ref 3.5–5.1)
SODIUM: 137 mmol/L (ref 135–145)

## 2017-10-16 LAB — GLUCOSE, CAPILLARY
GLUCOSE-CAPILLARY: 30 mg/dL — AB (ref 65–99)
GLUCOSE-CAPILLARY: 132 mg/dL — AB (ref 65–99)
GLUCOSE-CAPILLARY: 173 mg/dL — AB (ref 65–99)
GLUCOSE-CAPILLARY: 192 mg/dL — AB (ref 65–99)
Glucose-Capillary: 33 mg/dL — CL (ref 65–99)
Glucose-Capillary: 34 mg/dL — CL (ref 65–99)
Glucose-Capillary: 218 mg/dL — ABNORMAL HIGH (ref 65–99)

## 2017-10-16 LAB — URINE CULTURE: Culture: 80000 — AB

## 2017-10-16 LAB — CBC
HEMATOCRIT: 28.1 % — AB (ref 40.0–52.0)
Hemoglobin: 9.5 g/dL — ABNORMAL LOW (ref 13.0–18.0)
MCH: 34.6 pg — ABNORMAL HIGH (ref 26.0–34.0)
MCHC: 33.8 g/dL (ref 32.0–36.0)
MCV: 102.3 fL — ABNORMAL HIGH (ref 80.0–100.0)
PLATELETS: 427 10*3/uL (ref 150–440)
RBC: 2.75 MIL/uL — ABNORMAL LOW (ref 4.40–5.90)
RDW: 14.4 % (ref 11.5–14.5)
WBC: 7 10*3/uL (ref 3.8–10.6)

## 2017-10-16 LAB — MAGNESIUM: MAGNESIUM: 2 mg/dL (ref 1.7–2.4)

## 2017-10-16 LAB — PHOSPHORUS: PHOSPHORUS: 2.5 mg/dL (ref 2.5–4.6)

## 2017-10-16 MED ORDER — SODIUM CHLORIDE 0.9 % IV SOLN
2.0000 g | INTRAVENOUS | Status: DC
  Administered 2017-10-16 – 2017-10-17 (×2): 2 g via INTRAVENOUS
  Filled 2017-10-16 (×2): qty 20
  Filled 2017-10-16: qty 2

## 2017-10-16 MED ORDER — DEXTROSE 50 % IV SOLN
1.0000 | Freq: Once | INTRAVENOUS | Status: AC
  Administered 2017-10-16: 50 mL via INTRAVENOUS

## 2017-10-16 MED ORDER — MUPIROCIN 2 % EX OINT
1.0000 "application " | TOPICAL_OINTMENT | Freq: Two times a day (BID) | CUTANEOUS | Status: DC
  Filled 2017-10-16: qty 22

## 2017-10-16 MED ORDER — MUPIROCIN 2 % EX OINT
1.0000 "application " | TOPICAL_OINTMENT | Freq: Two times a day (BID) | CUTANEOUS | Status: AC
  Administered 2017-10-16 – 2017-10-19 (×7): 1 via NASAL
  Filled 2017-10-16: qty 22

## 2017-10-16 NOTE — Progress Notes (Signed)
Patient is not wearing the insulin pump yet.  He is waiting for a meeting with the diabetes coordinator.  The pump is on the counter in his room.  Phillis Knack, RN

## 2017-10-16 NOTE — Progress Notes (Signed)
Diabetes coordinator called RN and stated that if the patient went above 200 CBG to please start back on his pump.  Hospital meter registered [id number], patient's personal meter 30 minutes later registered [id number].  Patient is installing and setting up his pump while RN observes.  RN asked him to inform her of all insulin given with the pump, so it can be charted.  Patient understands.  Phillis Knack, RN

## 2017-10-16 NOTE — Progress Notes (Signed)
CBG was 30.  Patient drank orange juice with 5 sugar packets.  CBG 33.  Patient ate peanut butter with crackers and ate his eggs from breakfast tray.  CBG 34.    Added hypoglycemia protocol order set which RN was told in diabetes training includes D50.  It did not auto select the dose.  Dr. Anselm Jungling was paged twice.  He did not answer, so RN asked an MD on the floor.  Dr. Vianne Bulls said 1 ampule d50 was the correct dose and to administer the whole ampule.    RN and CNA worked with patient giving him soda and rechecking CBG.  RN began D50 IV and when ~ 59mL was complete, CBG went up to 132.  RN stopped administering D50.  Vial was saved for the patient in case there are future issues.  Patient denied that his tremors were from low blood sugar.  Patient reminded RN he has Parkinson's.  Patient was alert and oriented during the whole hypoglycemic event.  Phillis Knack, RN

## 2017-10-16 NOTE — Progress Notes (Signed)
[place] at [facility]                                                                                                                                                                                  Patient Demographics   John Reilly, is a 62 y.o. male, DOB - 28-Nov-1955, TIR:[id number]  Admit date - 10/13/2017   Admitting Physician Gorden Harms, MD  Outpatient Primary MD for the patient is Glendon Axe, MD   LOS - 3  Subjective: Feeling better still very weak Had hypoglycemia today.   Review of Systems:   CONSTITUTIONAL: No documented fever. No fatigue, positive weakness. No weight gain, no weight loss.  EYES: No blurry or double vision.  ENT: No tinnitus. No postnasal drip. No redness of the oropharynx.  RESPIRATORY: Positive cough, no wheeze, no hemoptysis. No dyspnea.  CARDIOVASCULAR: No chest pain. No orthopnea. No palpitations. No syncope.  GASTROINTESTINAL: No nausea, no vomiting or diarrhea. No abdominal pain. No melena or hematochezia.  GENITOURINARY: No dysuria or hematuria.  ENDOCRINE: No polyuria or nocturia. No heat or cold intolerance.  HEMATOLOGY: No anemia. No bruising. No bleeding.  INTEGUMENTARY: No rashes. No lesions.  MUSCULOSKELETAL: No arthritis. No swelling. No gout.  NEUROLOGIC: No numbness, tingling, or ataxia. No seizure-type activity.  PSYCHIATRIC: No anxiety. No insomnia. No ADD.    Vitals:   Vitals:   10/15/17 2346 10/16/17 0000 10/16/17 0604 10/16/17 0830  BP: 129/84 129/84 (!) 141/72 127/84  Pulse: 68 68 64 (!) 105  Resp: 17  18   Temp:   98 F (36.7 C)   TempSrc:      SpO2:   (!) 85% (!) 89%  Weight:      Height:        Wt Readings from Last 3 Encounters:  10/14/17 78.7 kg (173 lb 8 oz)  10/04/17 73 kg (161 lb)  10/12/16 85.7 kg (189 lb)     Intake/Output Summary (Last 24 hours) at 10/16/2017 1305 Last data filed at 10/16/2017 0840 Gross per 24 hour  Intake 2765 ml  Output 402 ml  Net 2363 ml     Physical Exam:   GENERAL: Pleasant-appearing in no apparent distress.  HEAD, EYES, EARS, NOSE AND THROAT: Atraumatic, normocephalic. Extraocular muscles are intact. Pupils equal and reactive to light. Sclerae anicteric. No conjunctival injection. No oro-pharyngeal erythema.  NECK: Supple. There is no jugular venous distention. No bruits, no lymphadenopathy, no thyromegaly.  HEART: Regular rate and rhythm,. No murmurs, no rubs, no clicks.  LUNGS: Rhonchus breath sounds bilaterally  aBDOMEN: Soft, flat, nontender, nondistended. Has good bowel sounds. No hepatosplenomegaly appreciated.  EXTREMITIES: No evidence of any  cyanosis, clubbing, or peripheral edema.  +2 pedal and radial pulses bilaterally.  NEUROLOGIC: The patient is alert, awake, and oriented x3 with no focal motor or sensory deficits appreciated bilaterally.  SKIN: Moist and warm with no rashes appreciated.  Psych: Not anxious, depressed LN: No inguinal LN enlargement    Antibiotics   Anti-infectives (From admission, onward)   Start     Dose/Rate Route Frequency Ordered Stop   10/16/17 1800  cefTRIAXone (ROCEPHIN) 2 g in sodium chloride 0.9 % 100 mL IVPB     2 g 200 mL/hr over 30 Minutes Intravenous Every 24 hours 10/16/17 0808     10/15/17 2200  cefTRIAXone (ROCEPHIN) 2 g in sodium chloride 0.9 % 100 mL IVPB  Status:  Discontinued     2 g 200 mL/hr over 30 Minutes Intravenous Every 24 hours 10/15/17 2156 10/16/17 0808   10/15/17 1500  aztreonam (AZACTAM) 2 g in sodium chloride 0.9 % 100 mL IVPB  Status:  Discontinued     2 g 200 mL/hr over 30 Minutes Intravenous Every 8 hours 10/15/17 1455 10/15/17 2156   10/14/17 1800  levofloxacin (LEVAQUIN) IVPB 750 mg  Status:  Discontinued     750 mg 100 mL/hr over 90 Minutes Intravenous Every 24 hours 10/14/17 0339 10/14/17 1004   10/14/17 1000  vancomycin (VANCOCIN) IVPB 1000 mg/200 mL premix  Status:  Discontinued     1,000 mg 200 mL/hr over 60 Minutes Intravenous Every 12  hours 10/14/17 0348 10/14/17 1004   10/14/17 0600  aztreonam (AZACTAM) 1 g in sodium chloride 0.9 % 100 mL IVPB  Status:  Discontinued     1 g 200 mL/hr over 30 Minutes Intravenous Every 12 hours 10/14/17 0339 10/14/17 1004   10/13/17 1800  levofloxacin (LEVAQUIN) IVPB 750 mg     750 mg 100 mL/hr over 90 Minutes Intravenous  Once 10/13/17 1752 10/13/17 1935   10/13/17 1800  aztreonam (AZACTAM) 2 g in sodium chloride 0.9 % 100 mL IVPB     2 g 200 mL/hr over 30 Minutes Intravenous  Once 10/13/17 1752 10/13/17 1837   10/13/17 1800  vancomycin (VANCOCIN) IVPB 1000 mg/200 mL premix     1,000 mg 200 mL/hr over 60 Minutes Intravenous  Once 10/13/17 1752 10/14/17 0439      Medications   Scheduled Meds: . carbidopa-levodopa  1 tablet Oral QHS  . carbidopa-levodopa  2 tablet Oral TID PC  . Chlorhexidine Gluconate Cloth  6 each Topical Q0600  . folic acid  1 mg Oral Daily  . heparin  5,000 Units Subcutaneous Q8H  . insulin aspart  0-5 Units Subcutaneous QHS  . insulin aspart  0-9 Units Subcutaneous TID WC  . insulin aspart  1,000 Units Intravenous Once  . insulin pump   Subcutaneous TID AC, HS, 0200  . Melatonin  2.5 mg Oral QHS  . multivitamin with minerals  1 tablet Oral Daily  . mupirocin ointment  1 application Nasal BID  . pantoprazole  40 mg Oral Daily  . sodium chloride flush  3 mL Intravenous Q12H  . sodium chloride flush  3 mL Intravenous Q12H  . sodium chloride flush  3 mL Intravenous Q12H  . thiamine  100 mg Oral Daily   Continuous Infusions: . cefTRIAXone (ROCEPHIN)  IV    . lactated ringers with kcl 100 mL/hr at 10/16/17 1157   PRN Meds:.acetaminophen, amLODipine, LORazepam **OR** LORazepam   Data Review:   Micro Results Recent Results (from the past 240 hour(s))  Urine Culture     Status: Abnormal   Collection Time: 10/13/17  5:24 PM  Result Value Ref Range Status   Specimen Description   Final    URINE, RANDOM Performed at [facility], [address] [place], [place] [address]    Special Requests   Final    NONE Performed at [facility], [address] [place], [place] [address]    Culture 80,000 COLONIES/mL KLEBSIELLA PNEUMONIAE (A)  Final   Report Status 10/16/2017 FINAL  Final   Organism ID, Bacteria KLEBSIELLA PNEUMONIAE (A)  Final      Susceptibility   Klebsiella pneumoniae - MIC*    AMPICILLIN >=32 RESISTANT Resistant     CEFAZOLIN <=4 SENSITIVE Sensitive     CEFTRIAXONE <=1 SENSITIVE Sensitive     CIPROFLOXACIN <=0.25 SENSITIVE Sensitive     GENTAMICIN <=1 SENSITIVE Sensitive     IMIPENEM <=0.25 SENSITIVE Sensitive     NITROFURANTOIN 32 SENSITIVE Sensitive     TRIMETH/SULFA <=20 SENSITIVE Sensitive     AMPICILLIN/SULBACTAM 8 SENSITIVE Sensitive     PIP/TAZO <=4 SENSITIVE Sensitive     Extended ESBL NEGATIVE Sensitive     * 80,000 COLONIES/mL KLEBSIELLA PNEUMONIAE  Blood Culture (routine x 2)     Status: Abnormal (Preliminary result)   Collection Time: 10/13/17  5:29 PM  Result Value Ref Range Status   Specimen Description   Final    BLOOD RIGHT ANTECUBITAL Performed at [facility], [address], [place], [place] [address]    Special Requests   Final    BOTTLES DRAWN AEROBIC AND ANAEROBIC Blood Culture adequate volume Performed at [facility], [address] [place], [place] [address]    Culture  Setup Time   Final    Organism ID to follow GRAM NEGATIVE RODS ANAEROBIC BOTTLE ONLY CRITICAL RESULT CALLED TO, READ BACK BY AND VERIFIED WITH: HANK ZOMPA ON 10/15/17 AT 78 QSD Performed at [facility], [address], [place], [place] [address]    Culture KLEBSIELLA PNEUMONIAE (A)  Final   Report Status PENDING  Incomplete  Blood Culture ID Panel (Reflexed)     Status: Abnormal   Collection Time: 10/13/17  5:29 PM  Result Value Ref Range Status   Enterococcus species NOT DETECTED NOT DETECTED Final   Listeria monocytogenes NOT DETECTED NOT DETECTED Final    Staphylococcus species NOT DETECTED NOT DETECTED Final   Staphylococcus aureus NOT DETECTED NOT DETECTED Final   Streptococcus species NOT DETECTED NOT DETECTED Final   Streptococcus agalactiae NOT DETECTED NOT DETECTED Final   Streptococcus pneumoniae NOT DETECTED NOT DETECTED Final   Streptococcus pyogenes NOT DETECTED NOT DETECTED Final   Acinetobacter baumannii NOT DETECTED NOT DETECTED Final   Enterobacteriaceae species DETECTED (A) NOT DETECTED Final    Comment: Enterobacteriaceae represent a large family of gram-negative bacteria, not a single organism. CRITICAL RESULT CALLED TO, READ BACK BY AND VERIFIED WITH: HANK ZOMPA ON 10/15/17 AT 1358 QSD    Enterobacter cloacae complex NOT DETECTED NOT DETECTED Final   Escherichia coli NOT DETECTED NOT DETECTED Final   Klebsiella oxytoca NOT DETECTED NOT DETECTED Final   Klebsiella pneumoniae DETECTED (A) NOT DETECTED Final    Comment: CRITICAL RESULT CALLED TO, READ BACK BY AND VERIFIED WITH: HANK ZOMPA ON 10/15/17 AT 1358 QSD    Proteus species NOT DETECTED NOT DETECTED Final   Serratia marcescens NOT DETECTED NOT DETECTED Final   Carbapenem resistance NOT DETECTED NOT DETECTED Final  Haemophilus influenzae NOT DETECTED NOT DETECTED Final   Neisseria meningitidis NOT DETECTED NOT DETECTED Final   Pseudomonas aeruginosa NOT DETECTED NOT DETECTED Final   Candida albicans NOT DETECTED NOT DETECTED Final   Candida glabrata NOT DETECTED NOT DETECTED Final   Candida krusei NOT DETECTED NOT DETECTED Final   Candida parapsilosis NOT DETECTED NOT DETECTED Final   Candida tropicalis NOT DETECTED NOT DETECTED Final    Comment: Performed at [facility], [address] [place], [place] [address]  Blood Culture (routine x 2)     Status: None (Preliminary result)   Collection Time: 10/13/17  5:34 PM  Result Value Ref Range Status   Specimen Description BLOOD BLOOD RIGHT FOREARM  Final   Special Requests   Final    BOTTLES DRAWN AEROBIC  AND ANAEROBIC Blood Culture adequate volume   Culture   Final    NO GROWTH 3 DAYS Performed at [facility], [address], [place], [place] [address]    Report Status PENDING  Incomplete  MRSA PCR Screening     Status: Abnormal   Collection Time: 10/13/17  8:00 PM  Result Value Ref Range Status   MRSA by PCR POSITIVE (A) NEGATIVE Final    Comment:        The GeneXpert MRSA Assay (FDA approved for NASAL specimens only), is one component of a comprehensive MRSA colonization surveillance program. It is not intended to diagnose MRSA infection nor to guide or monitor treatment for MRSA infections. RESULT CALLED TO, READ BACK BY AND VERIFIED WITH: DELL HOPKINS ON 10/13/17 AT 2228 JAG Performed at [facility], [address] [place], [place] [address]   CULTURE, BLOOD (ROUTINE X 2) w Reflex to ID Panel     Status: None (Preliminary result)   Collection Time: 10/15/17  3:39 PM  Result Value Ref Range Status   Specimen Description BLOOD RIGHT ANTECUBITAL  Final   Special Requests   Final    BOTTLES DRAWN AEROBIC AND ANAEROBIC Blood Culture adequate volume   Culture   Final    NO GROWTH < 24 HOURS Performed at [facility], [address], [place], [place] [address]    Report Status PENDING  Incomplete  CULTURE, BLOOD (ROUTINE X 2) w Reflex to ID Panel     Status: None (Preliminary result)   Collection Time: 10/15/17  3:39 PM  Result Value Ref Range Status   Specimen Description BLOOD BLOOD RIGHT HAND  Final   Special Requests   Final    BOTTLES DRAWN AEROBIC AND ANAEROBIC Blood Culture adequate volume   Culture   Final    NO GROWTH < 24 HOURS Performed at [facility], [address], [place], [place] [address]    Report Status PENDING  Incomplete    Radiology Reports Dg Chest 1 View  Result Date: 10/02/2017 CLINICAL DATA:  Generalized weakness, recent history of fall EXAM: CHEST  1 VIEW COMPARISON:  03/19/2015 FINDINGS: No focal  consolidation or effusion. Scarring at the left lung base. Stable cardiomediastinal silhouette. No pneumothorax. Old left-sided rib fracture. Suspected acute displaced right second rib fracture. Mildly displaced fracture involving the proximal shaft of the left humerus. IMPRESSION: 1. Mild scarring at the left lung base.  No acute opacity 2. Acute mildly displaced fracture proximal shaft of the left humerus. Acute appearing displaced right second rib fracture. Electronically Signed   By: Donavan Foil M.D.   On: 10/02/2017 23:50   Ct Head Wo Contrast  Result Date: 10/03/2017 CLINICAL DATA:  Fall with bruising to the left eye EXAM: CT HEAD WITHOUT CONTRAST TECHNIQUE: Contiguous axial images were obtained from the base of the skull through the vertex without intravenous contrast. COMPARISON:  MRI 12/16/2016 FINDINGS: Brain: No acute territorial infarction, hemorrhage or intracranial mass is visualized. Moderate atrophy. Slight asymmetric enlargement of left anterior convexity extra-axial CSF density. Mild small vessel ischemic changes of the white matter. Nonenlarged ventricles. Vascular: No hyperdense vessels. Scattered calcifications at the carotid siphon Skull: Normal. Negative for fracture or focal lesion. Sinuses/Orbits: No acute finding. Other: Mild left periorbital soft tissue swelling IMPRESSION: 1. No CT evidence for acute intracranial abnormality. 2. Atrophy and mild small vessel ischemic changes of the white matter 3. 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 Electronically Signed   By: Donavan Foil M.D.   On: 10/03/2017 00:04   Dg Chest Port 1 View  Result Date: 10/13/2017 CLINICAL DATA:  Altered mental status. EXAM: PORTABLE CHEST 1 VIEW COMPARISON:  10/02/2017 FINDINGS: Minimal scarring at the left base. No acute airspace opacities or effusions. Heart is normal size. No acute bony abnormality. IMPRESSION: No active  disease. Electronically Signed   By: Rolm Baptise M.D.   On: 10/13/2017 18:12   Dg Shoulder Left  Result Date: 10/02/2017 CLINICAL DATA:  Generalized weakness since this morning. Patient fell yesterday and hit the left shoulder. Limited movement. EXAM: LEFT SHOULDER - 2+ VIEW COMPARISON:  None. FINDINGS: Comminuted fractures of the left humeral head and neck with transverse fracture line across the surgical neck and vertical fracture lines extending to the greater tuberosity. There is impaction of fracture fragments with lateral angulation of the distal humeral fracture fragment. No dislocation at the shoulder joint. Multiple tiny displaced fragments are visualized. Visualized clavicle, scapula, and acromion appear intact. IMPRESSION: Comminuted fractures of the left humeral head and neck with impaction of fracture fragments and lateral angulation of the distal fracture fragment. Electronically Signed   By: Lucienne Capers M.D.   On: 10/02/2017 23:47     CBC Recent Labs  Lab 10/13/17 1729 10/13/17 2013 10/14/17 0804 10/15/17 0527 10/16/17 0636  WBC 11.2* 13.0* 11.0* 6.5 7.0  HGB 10.0* 8.6* 7.3* 7.4* 9.5*  HCT 33.2* 27.5* 21.4* 21.7* 28.1*  PLT 668* 480* 434 389 427  MCV 116.4* 113.8* 101.3* 103.2* 102.3*  MCH 35.0* 35.7* 34.5* 35.0* 34.6*  MCHC 30.1* 31.4* 34.0 33.9 33.8  RDW 16.2* 15.6* 14.5 14.9* 14.4  LYMPHSABS 2.5  --  1.3  --   --   MONOABS 1.0  --  0.8  --   --   EOSABS 0.0  --  0.0  --   --   BASOSABS 0.1  --  0.0  --   --     Chemistries  Recent Labs  Lab 10/13/17 1729  10/14/17 0804 10/14/17 1622 10/14/17 1936 10/15/17 0527 10/15/17 0829 10/16/17 0636  NA 133*   < > 140 140 140 136 136 137  K 4.1   < > 2.4* 2.9* 2.8* >7.5* 4.0 4.0  CL 96*   < > 113* 112* 112* 112* 108 108  CO2 <7*   < > 21* 21* 21* 18* 19* 23  GLUCOSE 765*   < > 102* 98 63* 194* 230* 57*  BUN 12   < > 10 9 8 6 6  <5*  CREATININE 1.58*   < > 0.90 0.88 0.80 0.59* 0.64 0.58*  CALCIUM 7.8*   < >  6.4* 6.9* 6.9* 6.8* 7.1* 7.4*  MG  --   --  0.9*  --  1.6* 1.5*  --  2.0  AST 32  --   --   --   --   --   --   --   ALT <5*  --   --   --   --   --   --   --   ALKPHOS 149*  --   --   --   --   --   --   --   BILITOT 1.9*  --   --   --   --   --   --   --    < > = values in this interval not displayed.   ------------------------------------------------------------------------------------------------------------------ estimated creatinine clearance is 98.9 mL/min (A) (by C-G formula based on SCr of 0.58 mg/dL (L)). ------------------------------------------------------------------------------------------------------------------ Recent Labs    10/13/17 2013  HGBA1C 7.0*   ------------------------------------------------------------------------------------------------------------------ No results for input(s): CHOL, HDL, LDLCALC, TRIG, CHOLHDL, LDLDIRECT in the last 72 hours. ------------------------------------------------------------------------------------------------------------------ No results for input(s): TSH, T4TOTAL, T3FREE, THYROIDAB in the last 72 hours.  Invalid input(s): FREET3 ------------------------------------------------------------------------------------------------------------------ Recent Labs    10/14/17 1622  VITAMINB12 734  FOLATE 30.0  FERRITIN 344*  TIBC NOT CALCULATED  IRON 25*  RETICCTPCT 1.1    Coagulation profile No results for input(s): INR, PROTIME in the last 168 hours.  No results for input(s): DDIMER in the last 72 hours.  Cardiac Enzymes Recent Labs  Lab 10/13/17 1729 10/13/17 2013 10/14/17 0244  TROPONINI 0.03* 0.08* 0.20*   ------------------------------------------------------------------------------------------------------------------ Invalid input(s): POCBNP    Assessment & Plan  Patient 62 year old admitted with DKA  #1 acute DKA- now hypoglycemia Patient received Lantus yesterday morning. Diabetic coordinator would  like to turn on his insulin pump prior to discharge Stopped scheduled meal time insuline.  D50 given one time. Monitor.  #2 anemia Panel consistent with anemia of chronic disease Recheck CBC - Hb stable now.  #3Acute encephalopathy most likely secondary to above, compounded by Parkinson's disease with dementia and heavy alcohol abuse improved PT evaluation  #4 cough with elevated procalcitonin  Chest x-ray is negative no indication for antibiotic  #5 acute kidney injury due to dehydration renal function now normal  #6 chronic heavy alcohol abuse with chronic hyponatremia monitor for withdrawal  #7 hypokalemia  Patient had a falsely elevated potassium earlier today recheck is normal  #8 bacteremia   Appreciated ID help- Rocephin.   Follow cx - repeated.   ID suggest to have CT abd / renal to r/o renal stone or pyelonephritis.  #9 miscellaneous Lovenox for DVT prophylaxis     Code Status Orders  (From admission, onward)        Start     Ordered   10/13/17 1956  Full code  Continuous     10/13/17 1955    Code Status History    Date Active Date Inactive Code Status Order ID Comments User Context   10/03/2017 0209 10/04/2017 1452 Full Code [id number]  Amelia Jo, MD Inpatient   03/19/2015 1939 03/21/2015 1928 Full Code [id number]  Bettey Costa, MD Inpatient   03/15/2015 2114 03/16/2015 1837 Full Code [id number]  Hower, Aaron Mose, MD ED      Consults tensive  DVT Prophylaxis  Lovenox  Lab Results  Component Value Date   PLT 427 10/16/2017     Time Spent in minutes 35 minutes spent  Greater than 50% of time spent in care coordination  and counseling patient regarding the condition and plan of care.   Vaughan Basta M.D on 10/16/2017 at 1:05 PM  Between 7am to 6pm - Pager - [phone]  After 6pm go to [email or web address] - Proofreader  [employer]   Office  [phone]

## 2017-10-16 NOTE — Plan of Care (Signed)
  Problem: Education: Goal: Knowledge of General Education information will improve Outcome: Progressing   Problem: Health Behavior/Discharge Planning: Goal: Ability to manage health-related needs will improve Outcome: Progressing   Problem: Pain Managment: Goal: General experience of comfort will improve Outcome: Progressing   Problem: Safety: Goal: Ability to remain free from injury will improve Outcome: Progressing   

## 2017-10-16 NOTE — Progress Notes (Signed)
Pharmacy Electrolyte Monitoring Consult:  Pharmacy consulted to assist in monitoring and replacing electrolytes in this 62 y.o. male admitted on 10/13/2017 with DKA.   Labs:  Sodium (mmol/L)  Date Value  10/16/2017 137   Potassium (mmol/L)  Date Value  10/16/2017 4.0   Magnesium (mg/dL)  Date Value  10/16/2017 2.0   Phosphorus (mg/dL)  Date Value  10/16/2017 2.5   Calcium (mg/dL)  Date Value  10/16/2017 7.4 (L)   Albumin (g/dL)  Date Value  10/15/2017 1.6 (L)    Assessment/Plan:  6/8 AM  Electrolytes are WNL. No additional supplementation is needed. Will recheck electrolytes in 48 hours.   Pharmacy will continue to monitor and adjust per consult.   Pernell Dupre, PharmD, BCPS Clinical Pharmacist 10/16/2017 8:11 AM

## 2017-10-16 NOTE — Progress Notes (Signed)
Patient self administered 3 units. Phillis Knack, RN

## 2017-10-16 NOTE — Progress Notes (Signed)
Inpatient Diabetes Program Recommendations  AACE/ADA: New Consensus Statement on Inpatient Glycemic Control (2015)  Target Ranges:  Prepandial:   less than 140 mg/dL      Peak postprandial:   less than 180 mg/dL (1-2 hours)      Critically ill patients:  140 - 180 mg/dL   Results for John Reilly, John Reilly (MRN [id number]) as of 10/16/2017 21:58  Ref. Range 10/16/2017 08:17 10/16/2017 08:46 10/16/2017 09:04 10/16/2017 09:31 10/16/2017 11:40 10/16/2017 16:32 10/16/2017 20:25  Glucose-Capillary Latest Ref Range: 65 - 99 mg/dL 30 (LL) 33 (LL) 34 (LL) 132 (H) 173 (H) 218 (H)  3 units NOVOLOG Given by pt per Insulin Pump 192 (H)    History:Type 1DM(diagnosed 1988), Parkinson's, Dementia, ETOH  Home DM Meds:Insulin Pump  Current Insulin Orders: Insulin Pump   I assisted patient and wife to reprogram the insulin pump yesterday (06/07) with the latest settings from pt's visit with Dr. Eddie Dibbles (Dr. Sherren Mocha office (CMA) gave [place] permission to use the last known settings from the visit with Dr. Eddie Dibbles back in April).  Pump was reprogrammed (see previous notes for settings) with pt and wife looking at the pump while I pressed the buttons and entered the data.  RN, Lennart Pall also reviewed and verified the pump settings with me as well.    Case was discussed with Dr. Posey Pronto yesterday (06/07) and permission given to me by Dr. Posey Pronto yesterday to have pt resume his insulin pump this AM (06/08) with the recommended insulin pump rates that were given to me by Dr. Sherren Mocha office.  Pt and wife assured me yesterday that patient was able to independently operate the insulin pump prior to admission and that pt can physically put the pump on today.    MD- Unsure what led to the severe Hypoglycemia event patient experienced this AM.  Perhaps the 4 units of Novolog that patient received the evening prior at dinner time led to the HYPO event?  Insulin Pump resumed by pt at ~5pm today.  Checked patient's chart at 3:30 pm  today.  Note that RN had placed a progress note at 1:30pm today stating that patient was waiting to meet the DM Coordinator before restarting his insulin pump.  DM Coordinator not physically present on campus over the weekend.  Called RN this afternoon (approximately 4:10pm) and discussed with RN that I was not physically on campus but was calling b/c I was concerned that pt had not yet resumed his insulin pump.  Relayed my concern to RN that pt could be at risk for DKA since he has Type 1 DM and does not make any endogenous insulin.  Pt has not had any basal insulin since yesterday AM (06/07).  Suggested to RN that pt resume his insulin pump this afternoon, but also asked RN to run this by the current Hospital MD seeing pt today to make sure this was still OK (since Dr. Posey Pronto saw pt yesterday and gave me orders to have pt resume his pump today and Dr. Anselm Jungling is seeing pt today).  Note that CBG was 218 mg/dl this afternoon at 5pm and pt resumed pump per nursing notes at 5pm.  CBG 192 mg/dl at 8:30pm tonight.     --Will follow patient during hospitalization--  Wyn Quaker RN, MSN, CDE Diabetes Coordinator Inpatient Glycemic Control Team Team Pager: [phone] (8a-5p)

## 2017-10-16 NOTE — Progress Notes (Signed)
Pharmacy Electrolyte Monitoring Consult:  Pharmacy consulted to assist in monitoring and replacing electrolytes in this 62 y.o. male admitted on 10/13/2017 with DKA and being treated for Klebsiella bacteremia.   Labs:  Sodium (mmol/L)  Date Value  10/16/2017 137   Potassium (mmol/L)  Date Value  10/16/2017 4.0   Magnesium (mg/dL)  Date Value  10/16/2017 2.0   Phosphorus (mg/dL)  Date Value  10/16/2017 2.5   Calcium (mg/dL)  Date Value  10/16/2017 7.4 (L)   Albumin (g/dL)  Date Value  10/15/2017 1.6 (L)    Assessment/Plan:  No further replacement warranted. 9  Unless otherwise ordered, will plan to recheck electrolytes on 6/10.   Pharmacy will continue to monitor and adjust per consult.   Simpson,Michael L 10/16/2017 8:11 AM

## 2017-10-17 ENCOUNTER — Inpatient Hospital Stay: Payer: BLUE CROSS/BLUE SHIELD

## 2017-10-17 LAB — CULTURE, BLOOD (ROUTINE X 2): Special Requests: ADEQUATE

## 2017-10-17 LAB — GLUCOSE, CAPILLARY
GLUCOSE-CAPILLARY: 212 mg/dL — AB (ref 65–99)
Glucose-Capillary: 54 mg/dL — ABNORMAL LOW (ref 65–99)
Glucose-Capillary: 87 mg/dL (ref 65–99)
Glucose-Capillary: 149 mg/dL — ABNORMAL HIGH (ref 65–99)
Glucose-Capillary: 175 mg/dL — ABNORMAL HIGH (ref 65–99)
Glucose-Capillary: 267 mg/dL — ABNORMAL HIGH (ref 65–99)
Glucose-Capillary: 252 mg/dL — ABNORMAL HIGH (ref 65–99)

## 2017-10-17 LAB — OCCULT BLOOD X 1 CARD TO LAB, STOOL: Fecal Occult Bld: NEGATIVE

## 2017-10-17 MED ORDER — OXYCODONE-ACETAMINOPHEN 5-325 MG PO TABS
1.0000 | ORAL_TABLET | ORAL | Status: DC | PRN
  Administered 2017-10-17 – 2017-10-20 (×18): 1 via ORAL
  Filled 2017-10-17 (×18): qty 1

## 2017-10-17 MED ORDER — MORPHINE SULFATE (PF) 2 MG/ML IV SOLN: 2.0000 mg | INTRAVENOUS | Status: DC | PRN

## 2017-10-17 NOTE — Progress Notes (Addendum)
Pt Blood sugar was at 54 at 0223 but no sign of distress. Pt was given 2 crackers with peanut butter, orange juice, and a soda. Pt blood sugar was re-checked and was at 87 at 0309. Will continue to monitor.

## 2017-10-17 NOTE — Progress Notes (Signed)
[place] at [facility]                                                                                                                                                                                  Patient Demographics   John Reilly, is a 62 y.o. male, DOB - 10-Feb-1956, DDU:[id number]  Admit date - 10/13/2017   Admitting Physician Gorden Harms, MD  Outpatient Primary MD for the patient is Glendon Axe, MD   LOS - 4  Subjective: Feeling better still very weak Had hypoglycemia today early morning.   Review of Systems:   CONSTITUTIONAL: No documented fever. No fatigue, positive weakness. No weight gain, no weight loss.  EYES: No blurry or double vision.  ENT: No tinnitus. No postnasal drip. No redness of the oropharynx.  RESPIRATORY: Positive cough, no wheeze, no hemoptysis. No dyspnea.  CARDIOVASCULAR: No chest pain. No orthopnea. No palpitations. No syncope.  GASTROINTESTINAL: No nausea, no vomiting or diarrhea. No abdominal pain. No melena or hematochezia.  GENITOURINARY: No dysuria or hematuria.  ENDOCRINE: No polyuria or nocturia. No heat or cold intolerance.  HEMATOLOGY: No anemia. No bruising. No bleeding.  INTEGUMENTARY: No rashes. No lesions.  MUSCULOSKELETAL: No arthritis. No swelling. No gout.  NEUROLOGIC: No numbness, tingling, or ataxia. No seizure-type activity.  PSYCHIATRIC: No anxiety. No insomnia. No ADD.    Vitals:   Vitals:   10/17/17 0416 10/17/17 0603 10/17/17 0734 10/17/17 1548  BP: 126/71 125/72 129/75 120/70  Pulse: 89 79 65 73  Resp: 18  18 18   Temp: 98.3 F (36.8 C)  98 F (36.7 C) 98.1 F (36.7 C)  TempSrc: Oral  Oral Oral  SpO2: 99%  94% 100%  Weight:      Height:        Wt Readings from Last 3 Encounters:  10/14/17 78.7 kg (173 lb 8 oz)  10/04/17 73 kg (161 lb)  10/12/16 85.7 kg (189 lb)     Intake/Output Summary (Last 24 hours) at 10/17/2017 1647 Last data filed at 10/17/2017 1603 Gross per 24 hour   Intake 1717.33 ml  Output 1525 ml  Net 192.33 ml    Physical Exam:   GENERAL: Pleasant-appearing in no apparent distress.  HEAD, EYES, EARS, NOSE AND THROAT: Atraumatic, normocephalic. Extraocular muscles are intact. Pupils equal and reactive to light. Sclerae anicteric. No conjunctival injection. No oro-pharyngeal erythema.  NECK: Supple. There is no jugular venous distention. No bruits, no lymphadenopathy, no thyromegaly.  HEART: Regular rate and rhythm,. No murmurs, no rubs, no clicks.  LUNGS: Rhonchus breath sounds bilaterally  aBDOMEN: Soft, flat, nontender, nondistended. Has good bowel sounds. No hepatosplenomegaly appreciated.  EXTREMITIES:  No evidence of any cyanosis, clubbing, or peripheral edema.  +2 pedal and radial pulses bilaterally. Left arm in a arm rest. NEUROLOGIC: The patient is alert, awake, and oriented x3 with no focal motor or sensory deficits appreciated bilaterally.  SKIN: Moist and warm with no rashes appreciated.  Psych: Not anxious, depressed LN: No inguinal LN enlargement    Antibiotics   Anti-infectives (From admission, onward)   Start     Dose/Rate Route Frequency Ordered Stop   10/16/17 1800  cefTRIAXone (ROCEPHIN) 2 g in sodium chloride 0.9 % 100 mL IVPB     2 g 200 mL/hr over 30 Minutes Intravenous Every 24 hours 10/16/17 0808     10/15/17 2200  cefTRIAXone (ROCEPHIN) 2 g in sodium chloride 0.9 % 100 mL IVPB  Status:  Discontinued     2 g 200 mL/hr over 30 Minutes Intravenous Every 24 hours 10/15/17 2156 10/16/17 0808   10/15/17 1500  aztreonam (AZACTAM) 2 g in sodium chloride 0.9 % 100 mL IVPB  Status:  Discontinued     2 g 200 mL/hr over 30 Minutes Intravenous Every 8 hours 10/15/17 1455 10/15/17 2156   10/14/17 1800  levofloxacin (LEVAQUIN) IVPB 750 mg  Status:  Discontinued     750 mg 100 mL/hr over 90 Minutes Intravenous Every 24 hours 10/14/17 0339 10/14/17 1004   10/14/17 1000  vancomycin (VANCOCIN) IVPB 1000 mg/200 mL premix  Status:   Discontinued     1,000 mg 200 mL/hr over 60 Minutes Intravenous Every 12 hours 10/14/17 0348 10/14/17 1004   10/14/17 0600  aztreonam (AZACTAM) 1 g in sodium chloride 0.9 % 100 mL IVPB  Status:  Discontinued     1 g 200 mL/hr over 30 Minutes Intravenous Every 12 hours 10/14/17 0339 10/14/17 1004   10/13/17 1800  levofloxacin (LEVAQUIN) IVPB 750 mg     750 mg 100 mL/hr over 90 Minutes Intravenous  Once 10/13/17 1752 10/13/17 1935   10/13/17 1800  aztreonam (AZACTAM) 2 g in sodium chloride 0.9 % 100 mL IVPB     2 g 200 mL/hr over 30 Minutes Intravenous  Once 10/13/17 1752 10/13/17 1837   10/13/17 1800  vancomycin (VANCOCIN) IVPB 1000 mg/200 mL premix     1,000 mg 200 mL/hr over 60 Minutes Intravenous  Once 10/13/17 1752 10/14/17 0439      Medications   Scheduled Meds: . carbidopa-levodopa  1 tablet Oral QHS  . carbidopa-levodopa  2 tablet Oral TID PC  . Chlorhexidine Gluconate Cloth  6 each Topical Q0600  . folic acid  1 mg Oral Daily  . heparin  5,000 Units Subcutaneous Q8H  . insulin pump   Subcutaneous TID AC, HS, 0200  . Melatonin  2.5 mg Oral QHS  . multivitamin with minerals  1 tablet Oral Daily  . mupirocin ointment  1 application Nasal BID  . pantoprazole  40 mg Oral Daily  . sodium chloride flush  3 mL Intravenous Q12H  . thiamine  100 mg Oral Daily   Continuous Infusions: . cefTRIAXone (ROCEPHIN)  IV 2 g (10/16/17 1751)   PRN Meds:.acetaminophen, amLODipine, morphine injection, oxyCODONE-acetaminophen   Data Review:   Micro Results Recent Results (from the past 240 hour(s))  Urine Culture     Status: Abnormal   Collection Time: 10/13/17  5:24 PM  Result Value Ref Range Status   Specimen Description   Final    URINE, RANDOM Performed at [facility], [address], [place], [place] [address]  Special Requests   Final    NONE Performed at [facility], [address] [place], [place] [address]    Culture 80,000 COLONIES/mL  KLEBSIELLA PNEUMONIAE (A)  Final   Report Status 10/16/2017 FINAL  Final   Organism ID, Bacteria KLEBSIELLA PNEUMONIAE (A)  Final      Susceptibility   Klebsiella pneumoniae - MIC*    AMPICILLIN >=32 RESISTANT Resistant     CEFAZOLIN <=4 SENSITIVE Sensitive     CEFTRIAXONE <=1 SENSITIVE Sensitive     CIPROFLOXACIN <=0.25 SENSITIVE Sensitive     GENTAMICIN <=1 SENSITIVE Sensitive     IMIPENEM <=0.25 SENSITIVE Sensitive     NITROFURANTOIN 32 SENSITIVE Sensitive     TRIMETH/SULFA <=20 SENSITIVE Sensitive     AMPICILLIN/SULBACTAM 8 SENSITIVE Sensitive     PIP/TAZO <=4 SENSITIVE Sensitive     Extended ESBL NEGATIVE Sensitive     * 80,000 COLONIES/mL KLEBSIELLA PNEUMONIAE  Blood Culture (routine x 2)     Status: Abnormal   Collection Time: 10/13/17  5:29 PM  Result Value Ref Range Status   Specimen Description   Final    BLOOD RIGHT ANTECUBITAL Performed at [facility], [address], [place], [place] [address]    Special Requests   Final    BOTTLES DRAWN AEROBIC AND ANAEROBIC Blood Culture adequate volume Performed at [facility], [address] [place], [place] [address]    Culture  Setup Time   Final    GRAM NEGATIVE RODS ANAEROBIC BOTTLE ONLY CRITICAL RESULT CALLED TO, READ BACK BY AND VERIFIED WITH: HANK ZOMPA ON 10/15/17 AT 1358 QSD Performed at [facility], [place] [address] [place], [place] [address]    Culture KLEBSIELLA PNEUMONIAE (A)  Final   Report Status 10/17/2017 FINAL  Final   Organism ID, Bacteria KLEBSIELLA PNEUMONIAE  Final      Susceptibility   Klebsiella pneumoniae - MIC*    AMPICILLIN >=32 RESISTANT Resistant     CEFAZOLIN <=4 SENSITIVE Sensitive     CEFEPIME <=1 SENSITIVE Sensitive     CEFTAZIDIME <=1 SENSITIVE Sensitive     CEFTRIAXONE <=1 SENSITIVE Sensitive     CIPROFLOXACIN <=0.25 SENSITIVE Sensitive     GENTAMICIN <=1 SENSITIVE Sensitive     IMIPENEM <=0.25 SENSITIVE Sensitive     TRIMETH/SULFA <=20 SENSITIVE Sensitive      AMPICILLIN/SULBACTAM 4 SENSITIVE Sensitive     PIP/TAZO <=4 SENSITIVE Sensitive     Extended ESBL NEGATIVE Sensitive     * KLEBSIELLA PNEUMONIAE  Blood Culture ID Panel (Reflexed)     Status: Abnormal   Collection Time: 10/13/17  5:29 PM  Result Value Ref Range Status   Enterococcus species NOT DETECTED NOT DETECTED Final   Listeria monocytogenes NOT DETECTED NOT DETECTED Final   Staphylococcus species NOT DETECTED NOT DETECTED Final   Staphylococcus aureus NOT DETECTED NOT DETECTED Final   Streptococcus species NOT DETECTED NOT DETECTED Final   Streptococcus agalactiae NOT DETECTED NOT DETECTED Final   Streptococcus pneumoniae NOT DETECTED NOT DETECTED Final   Streptococcus pyogenes NOT DETECTED NOT DETECTED Final   Acinetobacter baumannii NOT DETECTED NOT DETECTED Final   Enterobacteriaceae species DETECTED (A) NOT DETECTED Final    Comment: Enterobacteriaceae represent a large family of gram-negative bacteria, not a single organism. CRITICAL RESULT CALLED TO, READ BACK BY AND VERIFIED WITH: HANK ZOMPA ON 10/15/17 AT 1358 QSD    Enterobacter cloacae complex NOT DETECTED NOT DETECTED Final   Escherichia coli NOT DETECTED NOT DETECTED  Final   Klebsiella oxytoca NOT DETECTED NOT DETECTED Final   Klebsiella pneumoniae DETECTED (A) NOT DETECTED Final    Comment: CRITICAL RESULT CALLED TO, READ BACK BY AND VERIFIED WITH: HANK ZOMPA ON 10/15/17 AT 1358 QSD    Proteus species NOT DETECTED NOT DETECTED Final   Serratia marcescens NOT DETECTED NOT DETECTED Final   Carbapenem resistance NOT DETECTED NOT DETECTED Final   Haemophilus influenzae NOT DETECTED NOT DETECTED Final   Neisseria meningitidis NOT DETECTED NOT DETECTED Final   Pseudomonas aeruginosa NOT DETECTED NOT DETECTED Final   Candida albicans NOT DETECTED NOT DETECTED Final   Candida glabrata NOT DETECTED NOT DETECTED Final   Candida krusei NOT DETECTED NOT DETECTED Final   Candida parapsilosis NOT DETECTED NOT DETECTED Final    Candida tropicalis NOT DETECTED NOT DETECTED Final    Comment: Performed at [facility], [address] [place], [place] [address]  Blood Culture (routine x 2)     Status: None (Preliminary result)   Collection Time: 10/13/17  5:34 PM  Result Value Ref Range Status   Specimen Description BLOOD BLOOD RIGHT FOREARM  Final   Special Requests   Final    BOTTLES DRAWN AEROBIC AND ANAEROBIC Blood Culture adequate volume   Culture   Final    NO GROWTH 4 DAYS Performed at [facility], [address], [place], [place] [address]    Report Status PENDING  Incomplete  MRSA PCR Screening     Status: Abnormal   Collection Time: 10/13/17  8:00 PM  Result Value Ref Range Status   MRSA by PCR POSITIVE (A) NEGATIVE Final    Comment:        The GeneXpert MRSA Assay (FDA approved for NASAL specimens only), is one component of a comprehensive MRSA colonization surveillance program. It is not intended to diagnose MRSA infection nor to guide or monitor treatment for MRSA infections. RESULT CALLED TO, READ BACK BY AND VERIFIED WITH: DELL HOPKINS ON 10/13/17 AT 2228 JAG Performed at [facility], [address] [place], [place] [address]   CULTURE, BLOOD (ROUTINE X 2) w Reflex to ID Panel     Status: None (Preliminary result)   Collection Time: 10/15/17  3:39 PM  Result Value Ref Range Status   Specimen Description BLOOD RIGHT ANTECUBITAL  Final   Special Requests   Final    BOTTLES DRAWN AEROBIC AND ANAEROBIC Blood Culture adequate volume   Culture   Final    NO GROWTH 2 DAYS Performed at [facility], [address] [place], [place] [address]    Report Status PENDING  Incomplete  CULTURE, BLOOD (ROUTINE X 2) w Reflex to ID Panel     Status: None (Preliminary result)   Collection Time: 10/15/17  3:39 PM  Result Value Ref Range Status   Specimen Description BLOOD BLOOD RIGHT HAND  Final   Special Requests   Final    BOTTLES DRAWN AEROBIC AND  ANAEROBIC Blood Culture adequate volume   Culture   Final    NO GROWTH 2 DAYS Performed at [facility], [address], [place], [place] [address]    Report Status PENDING  Incomplete    Radiology Reports Dg Chest 1 View  Result Date: 10/16/2017 CLINICAL DATA:  Hypoxia EXAM: CHEST  1 VIEW COMPARISON:  October 13, 2017 FINDINGS: There is patchy opacity in the right base concerning for early pneumonia. There is focal consolidation medial left base. Lungs elsewhere clear. Heart size and  pulmonary vascularity are normal. No adenopathy. There is evidence of a prior fracture of the proximal left humeral metaphysis with displacement of fracture fragments. There old rib fractures on the left. IMPRESSION: Consolidation medial left base. Hazy opacity right base, also felt to represent pneumonia. Heart size is within normal limits.  No adenopathy evident. Displaced fracture proximal left humeral metaphysis. Electronically Signed   By: Lowella Grip III M.D.   On: 10/16/2017 13:10   Dg Chest 1 View  Result Date: 10/02/2017 CLINICAL DATA:  Generalized weakness, recent history of fall EXAM: CHEST  1 VIEW COMPARISON:  03/19/2015 FINDINGS: No focal consolidation or effusion. Scarring at the left lung base. Stable cardiomediastinal silhouette. No pneumothorax. Old left-sided rib fracture. Suspected acute displaced right second rib fracture. Mildly displaced fracture involving the proximal shaft of the left humerus. IMPRESSION: 1. Mild scarring at the left lung base.  No acute opacity 2. Acute mildly displaced fracture proximal shaft of the left humerus. Acute appearing displaced right second rib fracture. Electronically Signed   By: Donavan Foil M.D.   On: 10/02/2017 23:50   Ct Head Wo Contrast  Result Date: 10/03/2017 CLINICAL DATA:  Fall with bruising to the left eye EXAM: CT HEAD WITHOUT CONTRAST TECHNIQUE: Contiguous axial images were obtained from the base of the skull through the vertex without  intravenous contrast. COMPARISON:  MRI 12/16/2016 FINDINGS: Brain: No acute territorial infarction, hemorrhage or intracranial mass is visualized. Moderate atrophy. Slight asymmetric enlargement of left anterior convexity extra-axial CSF density. Mild small vessel ischemic changes of the white matter. Nonenlarged ventricles. Vascular: No hyperdense vessels. Scattered calcifications at the carotid siphon Skull: Normal. Negative for fracture or focal lesion. Sinuses/Orbits: No acute finding. Other: Mild left periorbital soft tissue swelling IMPRESSION: 1. No CT evidence for acute intracranial abnormality. 2. Atrophy and mild small vessel ischemic changes of the white matter 3. 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 Electronically Signed   By: Donavan Foil M.D.   On: 10/03/2017 00:04   Dg Chest Port 1 View  Result Date: 10/13/2017 CLINICAL DATA:  Altered mental status. EXAM: PORTABLE CHEST 1 VIEW COMPARISON:  10/02/2017 FINDINGS: Minimal scarring at the left base. No acute airspace opacities or effusions. Heart is normal size. No acute bony abnormality. IMPRESSION: No active disease. Electronically Signed   By: Rolm Baptise M.D.   On: 10/13/2017 18:12   Dg Shoulder Left  Result Date: 10/02/2017 CLINICAL DATA:  Generalized weakness since this morning. Patient fell yesterday and hit the left shoulder. Limited movement. EXAM: LEFT SHOULDER - 2+ VIEW COMPARISON:  None. FINDINGS: Comminuted fractures of the left humeral head and neck with transverse fracture line across the surgical neck and vertical fracture lines extending to the greater tuberosity. There is impaction of fracture fragments with lateral angulation of the distal humeral fracture fragment. No dislocation at the shoulder joint. Multiple tiny displaced fragments are visualized. Visualized clavicle, scapula, and acromion appear intact. IMPRESSION: Comminuted fractures  of the left humeral head and neck with impaction of fracture fragments and lateral angulation of the distal fracture fragment. Electronically Signed   By: Lucienne Capers M.D.   On: 10/02/2017 23:47   Ct Renal Stone Study  Result Date: 10/17/2017 CLINICAL DATA:  Bacteremia.  Inpatient. EXAM: CT ABDOMEN AND PELVIS WITHOUT CONTRAST TECHNIQUE: Multidetector CT imaging of the abdomen and pelvis was performed following the standard protocol without IV contrast. COMPARISON:  None. FINDINGS: Lower chest: Small dependent bilateral pleural  effusions with moderate dependent bibasilar atelectasis. Low-density cardiac blood pool compatible with anemia. Hepatobiliary: Normal liver size. Two scattered subcentimeter hypodense right liver lobe lesions, too small to characterize. No additional liver lesions. Possible tiny layering gallstone in the nondistended gallbladder with no gallbladder wall thickening or pericholecystic fluid. No biliary ductal dilatation. Pancreas: Cystic 2.4 x 1.9 cm pancreatic tail lesion (series 2/image 24). No additional pancreatic lesions. No pancreatic duct dilation. Spleen: Normal size. No mass. Adrenals/Urinary Tract: Normal adrenals. No renal stones. No hydronephrosis. No contour deforming renal masses. Normal caliber ureters, with no ureteral stones. Tiny focus of gas in the nondependent bladder, which is otherwise normal and nondistended. Stomach/Bowel: Normal non-distended stomach. Normal caliber small bowel with no small bowel wall thickening. Normal appendix. Normal large bowel with no diverticulosis, large bowel wall thickening or pericolonic fat stranding. Vascular/Lymphatic: Normal caliber abdominal aorta. No pathologically enlarged lymph nodes in the abdomen or pelvis. Reproductive: Top-normal size prostate. Other: No pneumoperitoneum, ascites or focal fluid collection. Mild anasarca. Symmetric mild to moderate gynecomastia. Small fat containing umbilical hernia. Musculoskeletal: No  aggressive appearing focal osseous lesions. Mild L2, severe L3 and moderate L4 vertebral compression fractures of indeterminate chronicity. Moderate lumbar spondylosis. IMPRESSION: 1. Mild L2, severe L3 and moderate L4 vertebral compression fractures of indeterminate chronicity. 2. No urolithiasis.  No hydronephrosis. 3. Indeterminate 2.4 cm pancreatic tail cystic lesion. Further characterization with MRI abdomen without and with IV contrast is recommended, preferably on a short term outpatient basis. 4. Small dependent bilateral pleural effusions with moderate dependent bibasilar atelectasis. Mild anasarca. 5. Possible cholelithiasis, with no CT findings of acute cholecystitis. 6. Nonspecific minimal gas in the nondependent bladder, presumably due to recent bladder instrumentation. Correlate with urinalysis if there is no history of recent bladder instrumentation to explain this gas. Electronically Signed   By: Ilona Sorrel M.D.   On: 10/17/2017 09:14     CBC Recent Labs  Lab 10/13/17 1729 10/13/17 2013 10/14/17 0804 10/15/17 0527 10/16/17 0636  WBC 11.2* 13.0* 11.0* 6.5 7.0  HGB 10.0* 8.6* 7.3* 7.4* 9.5*  HCT 33.2* 27.5* 21.4* 21.7* 28.1*  PLT 668* 480* 434 389 427  MCV 116.4* 113.8* 101.3* 103.2* 102.3*  MCH 35.0* 35.7* 34.5* 35.0* 34.6*  MCHC 30.1* 31.4* 34.0 33.9 33.8  RDW 16.2* 15.6* 14.5 14.9* 14.4  LYMPHSABS 2.5  --  1.3  --   --   MONOABS 1.0  --  0.8  --   --   EOSABS 0.0  --  0.0  --   --   BASOSABS 0.1  --  0.0  --   --     Chemistries  Recent Labs  Lab 10/13/17 1729  10/14/17 0804 10/14/17 1622 10/14/17 1936 10/15/17 0527 10/15/17 0829 10/16/17 0636  NA 133*   < > 140 140 140 136 136 137  K 4.1   < > 2.4* 2.9* 2.8* >7.5* 4.0 4.0  CL 96*   < > 113* 112* 112* 112* 108 108  CO2 <7*   < > 21* 21* 21* 18* 19* 23  GLUCOSE 765*   < > 102* 98 63* 194* 230* 57*  BUN 12   < > 10 9 8 6 6  <5*  CREATININE 1.58*   < > 0.90 0.88 0.80 0.59* 0.64 0.58*  CALCIUM 7.8*   < > 6.4*  6.9* 6.9* 6.8* 7.1* 7.4*  MG  --   --  0.9*  --  1.6* 1.5*  --  2.0  AST 32  --   --   --   --   --   --   --  ALT <5*  --   --   --   --   --   --   --   ALKPHOS 149*  --   --   --   --   --   --   --   BILITOT 1.9*  --   --   --   --   --   --   --    < > = values in this interval not displayed.   ------------------------------------------------------------------------------------------------------------------ estimated creatinine clearance is 98.9 mL/min (A) (by C-G formula based on SCr of 0.58 mg/dL (L)). ------------------------------------------------------------------------------------------------------------------ No results for input(s): HGBA1C in the last 72 hours. ------------------------------------------------------------------------------------------------------------------ No results for input(s): CHOL, HDL, LDLCALC, TRIG, CHOLHDL, LDLDIRECT in the last 72 hours. ------------------------------------------------------------------------------------------------------------------ No results for input(s): TSH, T4TOTAL, T3FREE, THYROIDAB in the last 72 hours.  Invalid input(s): FREET3 ------------------------------------------------------------------------------------------------------------------ No results for input(s): VITAMINB12, FOLATE, FERRITIN, TIBC, IRON, RETICCTPCT in the last 72 hours.  Coagulation profile No results for input(s): INR, PROTIME in the last 168 hours.  No results for input(s): DDIMER in the last 72 hours.  Cardiac Enzymes Recent Labs  Lab 10/13/17 1729 10/13/17 2013 10/14/17 0244  TROPONINI 0.03* 0.08* 0.20*   ------------------------------------------------------------------------------------------------------------------ Invalid input(s): POCBNP    Assessment & Plan  Patient 62 year old admitted with DKA  #1 acute DKA- now hypoglycemia Patient had turned on is insulin pump, we stopped again. Diabetic coordinator would like to adjust his  insulin pump prior to discharge Stopped scheduled meal time insuline. Monitor.  #2 anemia Panel consistent with anemia of chronic disease Recheck CBC - Hb stable now.  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  #8 bacteremia   Appreciated ID help- Rocephin.   Follow cx - repeated.   ID suggest to have CT abd / renal to r/o renal stone or pyelonephritis- done , negative .  #9 miscellaneous Lovenox for DVT prophylaxis     Code Status Orders  (From admission, onward)        Start     Ordered   10/13/17 1956  Full code  Continuous     10/13/17 1955    Code Status History    Date Active Date Inactive Code Status Order ID Comments User Context   10/03/2017 0209 10/04/2017 1452 Full Code [id number]  Amelia Jo, MD Inpatient   03/19/2015 1939 03/21/2015 1928 Full Code [id number]  Bettey Costa, MD Inpatient   03/15/2015 2114 03/16/2015 1837 Full Code [id number]  Hower, Aaron Mose, MD ED      Consults tensive  DVT Prophylaxis  Lovenox  Lab Results  Component Value Date   PLT 427 10/16/2017     Time Spent in minutes 35 minutes spent  Greater than 50% of time spent in care coordination and counseling patient regarding the condition and plan of care.   Vaughan Basta M.D on 10/17/2017 at 4:47 PM  Between 7am to 6pm - Pager - [phone]  After 6pm go to [email or web address] - Proofreader  [employer]   Office  [phone]

## 2017-10-17 NOTE — Plan of Care (Signed)
  Problem: Education: Goal: Knowledge of General Education information will improve Outcome: Progressing   Problem: Health Behavior/Discharge Planning: Goal: Ability to manage health-related needs will improve Outcome: Progressing   Problem: Pain Managment: Goal: General experience of comfort will improve Outcome: Progressing   Problem: Safety: Goal: Ability to remain free from injury will improve Outcome: Progressing   

## 2017-10-17 NOTE — Progress Notes (Signed)
Patient is in pain and Tylenol is not getting it.  Patient requests morphine for his broken arm and rib(s).  MD was paged.  Patient requested stronger pain medicine yesterday also.  Phillis Knack, RN

## 2017-10-17 NOTE — Progress Notes (Signed)
Patient's wife feels he needs home health.  She is requesting case management.  She feels he cannot stay home alone while she is at work.  Patient needs to see the diabetes coordinator tomorrow too.   Phillis Knack, RN

## 2017-10-17 NOTE — Clinical Social Work Note (Signed)
CSW received consult about home health needs. Please consult case management for this situation as PT is not recommending SNF. CSW is signing off. Please consult should additional needs arise.  Santiago Bumpers, MSW, Latanya Presser [phone]

## 2017-10-18 LAB — BASIC METABOLIC PANEL
ANION GAP: 7 (ref 5–15)
BUN: <5 mg/dL — ABNORMAL LOW (ref 6–20)
CALCIUM: 7.6 mg/dL — AB (ref 8.9–10.3)
CO2: 22 mmol/L (ref 22–32)
CREATININE: 0.56 mg/dL — AB (ref 0.61–1.24)
Chloride: 102 mmol/L (ref 101–111)
GLUCOSE: 153 mg/dL — AB (ref 65–99)
Potassium: 4.4 mmol/L (ref 3.5–5.1)
Sodium: 131 mmol/L — ABNORMAL LOW (ref 135–145)

## 2017-10-18 LAB — GLUCOSE, CAPILLARY
GLUCOSE-CAPILLARY: 184 mg/dL — AB (ref 65–99)
GLUCOSE-CAPILLARY: 132 mg/dL — AB (ref 65–99)
GLUCOSE-CAPILLARY: 202 mg/dL — AB (ref 65–99)
GLUCOSE-CAPILLARY: 36 mg/dL — AB (ref 65–99)
GLUCOSE-CAPILLARY: 186 mg/dL — AB (ref 65–99)
Glucose-Capillary: 31 mg/dL — CL (ref 65–99)
Glucose-Capillary: 81 mg/dL (ref 65–99)

## 2017-10-18 LAB — MAGNESIUM: Magnesium: 1.3 mg/dL — ABNORMAL LOW (ref 1.7–2.4)

## 2017-10-18 LAB — CULTURE, BLOOD (ROUTINE X 2)
Culture: NO GROWTH
SPECIAL REQUESTS: ADEQUATE

## 2017-10-18 MED ORDER — MAGNESIUM SULFATE 4 GM/100ML IV SOLN
4.0000 g | Freq: Once | INTRAVENOUS | Status: AC
  Administered 2017-10-18: 4 g via INTRAVENOUS
  Filled 2017-10-18: qty 100

## 2017-10-18 MED ORDER — INSULIN ASPART 100 UNIT/ML ~~LOC~~ SOLN
0.0000 [IU] | Freq: Three times a day (TID) | SUBCUTANEOUS | Status: DC
  Administered 2017-10-18: 3 [IU] via SUBCUTANEOUS
  Administered 2017-10-19: 5 [IU] via SUBCUTANEOUS
  Filled 2017-10-18 (×2): qty 1

## 2017-10-18 MED ORDER — INSULIN GLARGINE 100 UNIT/ML ~~LOC~~ SOLN
10.0000 [IU] | Freq: Every day | SUBCUTANEOUS | Status: DC
  Administered 2017-10-18: 10 [IU] via SUBCUTANEOUS
  Filled 2017-10-18 (×2): qty 0.1

## 2017-10-18 MED ORDER — GLUCOSE 40 % PO GEL
1.0000 | Freq: Once | ORAL | Status: AC
  Administered 2017-10-18: 37.5 g via ORAL

## 2017-10-18 MED ORDER — INSULIN ASPART 100 UNIT/ML ~~LOC~~ SOLN: 0.0000 [IU] | Freq: Every day | SUBCUTANEOUS | Status: DC

## 2017-10-18 MED ORDER — CEPHALEXIN 500 MG PO CAPS
500.0000 mg | ORAL_CAPSULE | Freq: Three times a day (TID) | ORAL | Status: DC
  Administered 2017-10-18 – 2017-10-20 (×7): 500 mg via ORAL
  Filled 2017-10-18 (×7): qty 1

## 2017-10-18 MED ORDER — DEXTROSE 50 % IV SOLN
INTRAVENOUS | Status: AC
  Administered 2017-10-18: 50 mL
  Filled 2017-10-18: qty 50

## 2017-10-18 MED ORDER — ACETAMINOPHEN 325 MG PO TABS: 650.0000 mg | ORAL_TABLET | ORAL | Status: DC | PRN

## 2017-10-18 MED ORDER — GLUCOSE 40 % PO GEL
ORAL | Status: AC
  Filled 2017-10-18: qty 1

## 2017-10-18 NOTE — Progress Notes (Signed)
[place] [facility]                                                                                                                                                                                  Patient Demographics   Taevin Mcferran, is a 62 y.o. male, DOB - 1955-05-26, RKY:[id number]  Admit date - 10/13/2017   Admitting Physician Gorden Harms, MD  Outpatient Primary MD for the patient is Glendon Axe, MD   LOS - 5  Subjective: Feeling better still very weak Blood sugar under control, as per wife- very weak.  Review of Systems:   CONSTITUTIONAL: No documented fever. No fatigue, positive weakness. No weight gain, no weight loss.  EYES: No blurry or double vision.  ENT: No tinnitus. No postnasal drip. No redness of the oropharynx.  RESPIRATORY: Positive cough, no wheeze, no hemoptysis. No dyspnea.  CARDIOVASCULAR: No chest pain. No orthopnea. No palpitations. No syncope.  GASTROINTESTINAL: No nausea, no vomiting or diarrhea. No abdominal pain. No melena or hematochezia.  GENITOURINARY: No dysuria or hematuria.  ENDOCRINE: No polyuria or nocturia. No heat or cold intolerance.  HEMATOLOGY: No anemia. No bruising. No bleeding.  INTEGUMENTARY: No rashes. No lesions.  MUSCULOSKELETAL: No arthritis. No swelling. No gout.  NEUROLOGIC: No numbness, tingling, or ataxia. No seizure-type activity.  PSYCHIATRIC: No anxiety. No insomnia. No ADD.    Vitals:   Vitals:   10/17/17 0734 10/17/17 1548 10/17/17 1932 10/18/17 0445  BP: 129/75 120/70 102/61 138/70  Pulse: 65 73 (!) 114 98  Resp: 18 18 16 18   Temp: 98 F (36.7 C) 98.1 F (36.7 C) 98.1 F (36.7 C) 97.7 F (36.5 C)  TempSrc: Oral Oral Oral Oral  SpO2: 94% 100% 100% 99%  Weight:      Height:        Wt Readings from Last 3 Encounters:  10/14/17 78.7 kg (173 lb 8 oz)  10/04/17 73 kg (161 lb)  10/12/16 85.7 kg (189 lb)     Intake/Output Summary (Last 24 hours) at 10/18/2017 1510 Last data  filed at 10/18/2017 1300 Gross per 24 hour  Intake -  Output 1525 ml  Net -1525 ml    Physical Exam:   GENERAL: Pleasant-appearing in no apparent distress.  HEAD, EYES, EARS, NOSE AND THROAT: Atraumatic, normocephalic. Extraocular muscles are intact. Pupils equal and reactive to light. Sclerae anicteric. No conjunctival injection. No oro-pharyngeal erythema.  NECK: Supple. There is no jugular venous distention. No bruits, no lymphadenopathy, no thyromegaly.  HEART: Regular rate and rhythm,. No murmurs, no rubs, no clicks.  LUNGS: Rhonchus breath sounds bilaterally  aBDOMEN: Soft, flat, nontender, nondistended. Has good bowel  sounds. No hepatosplenomegaly appreciated.  EXTREMITIES: No evidence of any cyanosis, clubbing, or peripheral edema.  +2 pedal and radial pulses bilaterally. Left arm in a arm rest. NEUROLOGIC: The patient is alert, awake, and oriented x3 with no focal motor or sensory deficits appreciated bilaterally.  SKIN: Moist and warm with no rashes appreciated.  Psych: Not anxious, depressed LN: No inguinal LN enlargement    Antibiotics   Anti-infectives (From admission, onward)   Start     Dose/Rate Route Frequency Ordered Stop   10/18/17 1500  cephALEXin (KEFLEX) capsule 500 mg     500 mg Oral Every 8 hours 10/18/17 1445 10/23/17 1359   10/16/17 1800  cefTRIAXone (ROCEPHIN) 2 g in sodium chloride 0.9 % 100 mL IVPB  Status:  Discontinued     2 g 200 mL/hr over 30 Minutes Intravenous Every 24 hours 10/16/17 0808 10/18/17 1445   10/15/17 2200  cefTRIAXone (ROCEPHIN) 2 g in sodium chloride 0.9 % 100 mL IVPB  Status:  Discontinued     2 g 200 mL/hr over 30 Minutes Intravenous Every 24 hours 10/15/17 2156 10/16/17 0808   10/15/17 1500  aztreonam (AZACTAM) 2 g in sodium chloride 0.9 % 100 mL IVPB  Status:  Discontinued     2 g 200 mL/hr over 30 Minutes Intravenous Every 8 hours 10/15/17 1455 10/15/17 2156   10/14/17 1800  levofloxacin (LEVAQUIN) IVPB 750 mg  Status:   Discontinued     750 mg 100 mL/hr over 90 Minutes Intravenous Every 24 hours 10/14/17 0339 10/14/17 1004   10/14/17 1000  vancomycin (VANCOCIN) IVPB 1000 mg/200 mL premix  Status:  Discontinued     1,000 mg 200 mL/hr over 60 Minutes Intravenous Every 12 hours 10/14/17 0348 10/14/17 1004   10/14/17 0600  aztreonam (AZACTAM) 1 g in sodium chloride 0.9 % 100 mL IVPB  Status:  Discontinued     1 g 200 mL/hr over 30 Minutes Intravenous Every 12 hours 10/14/17 0339 10/14/17 1004   10/13/17 1800  levofloxacin (LEVAQUIN) IVPB 750 mg     750 mg 100 mL/hr over 90 Minutes Intravenous  Once 10/13/17 1752 10/13/17 1935   10/13/17 1800  aztreonam (AZACTAM) 2 g in sodium chloride 0.9 % 100 mL IVPB     2 g 200 mL/hr over 30 Minutes Intravenous  Once 10/13/17 1752 10/13/17 1837   10/13/17 1800  vancomycin (VANCOCIN) IVPB 1000 mg/200 mL premix     1,000 mg 200 mL/hr over 60 Minutes Intravenous  Once 10/13/17 1752 10/14/17 0439      Medications   Scheduled Meds: . carbidopa-levodopa  1 tablet Oral QHS  . carbidopa-levodopa  2 tablet Oral TID PC  . cephALEXin  500 mg Oral Q8H  . Chlorhexidine Gluconate Cloth  6 each Topical Q0600  . folic acid  1 mg Oral Daily  . heparin  5,000 Units Subcutaneous Q8H  . insulin aspart  0-5 Units Subcutaneous QHS  . insulin aspart  0-9 Units Subcutaneous TID WC  . insulin glargine  10 Units Subcutaneous Daily  . Melatonin  2.5 mg Oral QHS  . multivitamin with minerals  1 tablet Oral Daily  . mupirocin ointment  1 application Nasal BID  . pantoprazole  40 mg Oral Daily  . sodium chloride flush  3 mL Intravenous Q12H  . thiamine  100 mg Oral Daily   Continuous Infusions:  PRN Meds:.acetaminophen, amLODipine, morphine injection, oxyCODONE-acetaminophen   Data Review:   Micro Results Recent Results (from the past 240  hour(s))  Urine Culture     Status: Abnormal   Collection Time: 10/13/17  5:24 PM  Result Value Ref Range Status   Specimen Description    Final    URINE, RANDOM Performed at [facility], [address] [place], [place] [address]    Special Requests   Final    NONE Performed at [facility], [place], [place] [address]    Culture 80,000 COLONIES/mL KLEBSIELLA PNEUMONIAE (A)  Final   Report Status 10/16/2017 FINAL  Final   Organism ID, Bacteria KLEBSIELLA PNEUMONIAE (A)  Final      Susceptibility   Klebsiella pneumoniae - MIC*    AMPICILLIN >=32 RESISTANT Resistant     CEFAZOLIN <=4 SENSITIVE Sensitive     CEFTRIAXONE <=1 SENSITIVE Sensitive     CIPROFLOXACIN <=0.25 SENSITIVE Sensitive     GENTAMICIN <=1 SENSITIVE Sensitive     IMIPENEM <=0.25 SENSITIVE Sensitive     NITROFURANTOIN 32 SENSITIVE Sensitive     TRIMETH/SULFA <=20 SENSITIVE Sensitive     AMPICILLIN/SULBACTAM 8 SENSITIVE Sensitive     PIP/TAZO <=4 SENSITIVE Sensitive     Extended ESBL NEGATIVE Sensitive     * 80,000 COLONIES/mL KLEBSIELLA PNEUMONIAE  Blood Culture (routine x 2)     Status: Abnormal   Collection Time: 10/13/17  5:29 PM  Result Value Ref Range Status   Specimen Description   Final    BLOOD RIGHT ANTECUBITAL Performed at [facility], [address], [place], [place] [address]    Special Requests   Final    BOTTLES DRAWN AEROBIC AND ANAEROBIC Blood Culture adequate volume Performed at [facility], [address] [place], [place] [address]    Culture  Setup Time   Final    GRAM NEGATIVE RODS ANAEROBIC BOTTLE ONLY CRITICAL RESULT CALLED TO, READ BACK BY AND VERIFIED WITH: HANK ZOMPA ON 10/15/17 AT 1358 QSD Performed at [facility], [place] [address] [place], [place] [address]    Culture KLEBSIELLA PNEUMONIAE (A)  Final   Report Status 10/17/2017 FINAL  Final   Organism ID, Bacteria KLEBSIELLA PNEUMONIAE  Final      Susceptibility   Klebsiella pneumoniae - MIC*    AMPICILLIN >=32 RESISTANT Resistant     CEFAZOLIN <=4 SENSITIVE Sensitive     CEFEPIME <=1 SENSITIVE  Sensitive     CEFTAZIDIME <=1 SENSITIVE Sensitive     CEFTRIAXONE <=1 SENSITIVE Sensitive     CIPROFLOXACIN <=0.25 SENSITIVE Sensitive     GENTAMICIN <=1 SENSITIVE Sensitive     IMIPENEM <=0.25 SENSITIVE Sensitive     TRIMETH/SULFA <=20 SENSITIVE Sensitive     AMPICILLIN/SULBACTAM 4 SENSITIVE Sensitive     PIP/TAZO <=4 SENSITIVE Sensitive     Extended ESBL NEGATIVE Sensitive     * KLEBSIELLA PNEUMONIAE  Blood Culture ID Panel (Reflexed)     Status: Abnormal   Collection Time: 10/13/17  5:29 PM  Result Value Ref Range Status   Enterococcus species NOT DETECTED NOT DETECTED Final   Listeria monocytogenes NOT DETECTED NOT DETECTED Final   Staphylococcus species NOT DETECTED NOT DETECTED Final   Staphylococcus aureus NOT DETECTED NOT DETECTED Final   Streptococcus species NOT DETECTED NOT DETECTED Final   Streptococcus agalactiae NOT DETECTED NOT DETECTED Final   Streptococcus pneumoniae NOT DETECTED NOT DETECTED Final   Streptococcus pyogenes NOT DETECTED NOT DETECTED Final   Acinetobacter baumannii NOT DETECTED NOT DETECTED Final   Enterobacteriaceae species DETECTED (A) NOT DETECTED Final  Comment: Enterobacteriaceae represent a large family of gram-negative bacteria, not a single organism. CRITICAL RESULT CALLED TO, READ BACK BY AND VERIFIED WITH: HANK ZOMPA ON 10/15/17 AT 1358 QSD    Enterobacter cloacae complex NOT DETECTED NOT DETECTED Final   Escherichia coli NOT DETECTED NOT DETECTED Final   Klebsiella oxytoca NOT DETECTED NOT DETECTED Final   Klebsiella pneumoniae DETECTED (A) NOT DETECTED Final    Comment: CRITICAL RESULT CALLED TO, READ BACK BY AND VERIFIED WITH: HANK ZOMPA ON 10/15/17 AT 1358 QSD    Proteus species NOT DETECTED NOT DETECTED Final   Serratia marcescens NOT DETECTED NOT DETECTED Final   Carbapenem resistance NOT DETECTED NOT DETECTED Final   Haemophilus influenzae NOT DETECTED NOT DETECTED Final   Neisseria meningitidis NOT DETECTED NOT DETECTED Final    Pseudomonas aeruginosa NOT DETECTED NOT DETECTED Final   Candida albicans NOT DETECTED NOT DETECTED Final   Candida glabrata NOT DETECTED NOT DETECTED Final   Candida krusei NOT DETECTED NOT DETECTED Final   Candida parapsilosis NOT DETECTED NOT DETECTED Final   Candida tropicalis NOT DETECTED NOT DETECTED Final    Comment: Performed at [facility], [address] [place], [place] [address]  Blood Culture (routine x 2)     Status: None   Collection Time: 10/13/17  5:34 PM  Result Value Ref Range Status   Specimen Description BLOOD BLOOD RIGHT FOREARM  Final   Special Requests   Final    BOTTLES DRAWN AEROBIC AND ANAEROBIC Blood Culture adequate volume   Culture   Final    NO GROWTH 5 DAYS Performed at [facility], [address] [place], [place] [address]    Report Status 10/18/2017 FINAL  Final  MRSA PCR Screening     Status: Abnormal   Collection Time: 10/13/17  8:00 PM  Result Value Ref Range Status   MRSA by PCR POSITIVE (A) NEGATIVE Final    Comment:        The GeneXpert MRSA Assay (FDA approved for NASAL specimens only), is one component of a comprehensive MRSA colonization surveillance program. It is not intended to diagnose MRSA infection nor to guide or monitor treatment for MRSA infections. RESULT CALLED TO, READ BACK BY AND VERIFIED WITH: DELL HOPKINS ON 10/13/17 AT 2228 JAG Performed at [facility], [address] [place], [place] [address]   CULTURE, BLOOD (ROUTINE X 2) w Reflex to ID Panel     Status: None (Preliminary result)   Collection Time: 10/15/17  3:39 PM  Result Value Ref Range Status   Specimen Description BLOOD RIGHT ANTECUBITAL  Final   Special Requests   Final    BOTTLES DRAWN AEROBIC AND ANAEROBIC Blood Culture adequate volume   Culture   Final    NO GROWTH 3 DAYS Performed at [facility], [address] [place], [place] [address]    Report Status PENDING  Incomplete  CULTURE, BLOOD (ROUTINE X  2) w Reflex to ID Panel     Status: None (Preliminary result)   Collection Time: 10/15/17  3:39 PM  Result Value Ref Range Status   Specimen Description BLOOD BLOOD RIGHT HAND  Final   Special Requests   Final    BOTTLES DRAWN AEROBIC AND ANAEROBIC Blood Culture adequate volume   Culture   Final    NO GROWTH 3 DAYS Performed at [facility], [address], [place], [place] [address]    Report Status PENDING  Incomplete    Radiology Reports  Dg Chest 1 View  Result Date: 10/16/2017 CLINICAL DATA:  Hypoxia EXAM: CHEST  1 VIEW COMPARISON:  October 13, 2017 FINDINGS: There is patchy opacity in the right base concerning for early pneumonia. There is focal consolidation medial left base. Lungs elsewhere clear. Heart size and pulmonary vascularity are normal. No adenopathy. There is evidence of a prior fracture of the proximal left humeral metaphysis with displacement of fracture fragments. There old rib fractures on the left. IMPRESSION: Consolidation medial left base. Hazy opacity right base, also felt to represent pneumonia. Heart size is within normal limits.  No adenopathy evident. Displaced fracture proximal left humeral metaphysis. Electronically Signed   By: Lowella Grip III M.D.   On: 10/16/2017 13:10   Dg Chest 1 View  Result Date: 10/02/2017 CLINICAL DATA:  Generalized weakness, recent history of fall EXAM: CHEST  1 VIEW COMPARISON:  03/19/2015 FINDINGS: No focal consolidation or effusion. Scarring at the left lung base. Stable cardiomediastinal silhouette. No pneumothorax. Old left-sided rib fracture. Suspected acute displaced right second rib fracture. Mildly displaced fracture involving the proximal shaft of the left humerus. IMPRESSION: 1. Mild scarring at the left lung base.  No acute opacity 2. Acute mildly displaced fracture proximal shaft of the left humerus. Acute appearing displaced right second rib fracture. Electronically Signed   By: Donavan Foil M.D.   On: 10/02/2017  23:50   Ct Head Wo Contrast  Result Date: 10/03/2017 CLINICAL DATA:  Fall with bruising to the left eye EXAM: CT HEAD WITHOUT CONTRAST TECHNIQUE: Contiguous axial images were obtained from the base of the skull through the vertex without intravenous contrast. COMPARISON:  MRI 12/16/2016 FINDINGS: Brain: No acute territorial infarction, hemorrhage or intracranial mass is visualized. Moderate atrophy. Slight asymmetric enlargement of left anterior convexity extra-axial CSF density. Mild small vessel ischemic changes of the white matter. Nonenlarged ventricles. Vascular: No hyperdense vessels. Scattered calcifications at the carotid siphon Skull: Normal. Negative for fracture or focal lesion. Sinuses/Orbits: No acute finding. Other: Mild left periorbital soft tissue swelling IMPRESSION: 1. No CT evidence for acute intracranial abnormality. 2. Atrophy and mild small vessel ischemic changes of the white matter 3. 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 Electronically Signed   By: Donavan Foil M.D.   On: 10/03/2017 00:04   Dg Chest Port 1 View  Result Date: 10/13/2017 CLINICAL DATA:  Altered mental status. EXAM: PORTABLE CHEST 1 VIEW COMPARISON:  10/02/2017 FINDINGS: Minimal scarring at the left base. No acute airspace opacities or effusions. Heart is normal size. No acute bony abnormality. IMPRESSION: No active disease. Electronically Signed   By: Rolm Baptise M.D.   On: 10/13/2017 18:12   Dg Shoulder Left  Result Date: 10/02/2017 CLINICAL DATA:  Generalized weakness since this morning. Patient fell yesterday and hit the left shoulder. Limited movement. EXAM: LEFT SHOULDER - 2+ VIEW COMPARISON:  None. FINDINGS: Comminuted fractures of the left humeral head and neck with transverse fracture line across the surgical neck and vertical fracture lines extending to the greater tuberosity. There is impaction of fracture fragments  with lateral angulation of the distal humeral fracture fragment. No dislocation at the shoulder joint. Multiple tiny displaced fragments are visualized. Visualized clavicle, scapula, and acromion appear intact. IMPRESSION: Comminuted fractures of the left humeral head and neck with impaction of fracture fragments and lateral angulation of the distal fracture fragment. Electronically Signed   By: Lucienne Capers M.D.   On: 10/02/2017 23:47   Ct  Renal Stone Study  Result Date: 10/17/2017 CLINICAL DATA:  Bacteremia.  Inpatient. EXAM: CT ABDOMEN AND PELVIS WITHOUT CONTRAST TECHNIQUE: Multidetector CT imaging of the abdomen and pelvis was performed following the standard protocol without IV contrast. COMPARISON:  None. FINDINGS: Lower chest: Small dependent bilateral pleural effusions with moderate dependent bibasilar atelectasis. Low-density cardiac blood pool compatible with anemia. Hepatobiliary: Normal liver size. Two scattered subcentimeter hypodense right liver lobe lesions, too small to characterize. No additional liver lesions. Possible tiny layering gallstone in the nondistended gallbladder with no gallbladder wall thickening or pericholecystic fluid. No biliary ductal dilatation. Pancreas: Cystic 2.4 x 1.9 cm pancreatic tail lesion (series 2/image 24). No additional pancreatic lesions. No pancreatic duct dilation. Spleen: Normal size. No mass. Adrenals/Urinary Tract: Normal adrenals. No renal stones. No hydronephrosis. No contour deforming renal masses. Normal caliber ureters, with no ureteral stones. Tiny focus of gas in the nondependent bladder, which is otherwise normal and nondistended. Stomach/Bowel: Normal non-distended stomach. Normal caliber small bowel with no small bowel wall thickening. Normal appendix. Normal large bowel with no diverticulosis, large bowel wall thickening or pericolonic fat stranding. Vascular/Lymphatic: Normal caliber abdominal aorta. No pathologically enlarged lymph nodes in  the abdomen or pelvis. Reproductive: Top-normal size prostate. Other: No pneumoperitoneum, ascites or focal fluid collection. Mild anasarca. Symmetric mild to moderate gynecomastia. Small fat containing umbilical hernia. Musculoskeletal: No aggressive appearing focal osseous lesions. Mild L2, severe L3 and moderate L4 vertebral compression fractures of indeterminate chronicity. Moderate lumbar spondylosis. IMPRESSION: 1. Mild L2, severe L3 and moderate L4 vertebral compression fractures of indeterminate chronicity. 2. No urolithiasis.  No hydronephrosis. 3. Indeterminate 2.4 cm pancreatic tail cystic lesion. Further characterization with MRI abdomen without and with IV contrast is recommended, preferably on a short term outpatient basis. 4. Small dependent bilateral pleural effusions with moderate dependent bibasilar atelectasis. Mild anasarca. 5. Possible cholelithiasis, with no CT findings of acute cholecystitis. 6. Nonspecific minimal gas in the nondependent bladder, presumably due to recent bladder instrumentation. Correlate with urinalysis if there is no history of recent bladder instrumentation to explain this gas. Electronically Signed   By: Ilona Sorrel M.D.   On: 10/17/2017 09:14     CBC Recent Labs  Lab 10/13/17 1729 10/13/17 2013 10/14/17 0804 10/15/17 0527 10/16/17 0636  WBC 11.2* 13.0* 11.0* 6.5 7.0  HGB 10.0* 8.6* 7.3* 7.4* 9.5*  HCT 33.2* 27.5* 21.4* 21.7* 28.1*  PLT 668* 480* 434 389 427  MCV 116.4* 113.8* 101.3* 103.2* 102.3*  MCH 35.0* 35.7* 34.5* 35.0* 34.6*  MCHC 30.1* 31.4* 34.0 33.9 33.8  RDW 16.2* 15.6* 14.5 14.9* 14.4  LYMPHSABS 2.5  --  1.3  --   --   MONOABS 1.0  --  0.8  --   --   EOSABS 0.0  --  0.0  --   --   BASOSABS 0.1  --  0.0  --   --     Chemistries  Recent Labs  Lab 10/13/17 1729  10/14/17 0804  10/14/17 1936 10/15/17 0527 10/15/17 0829 10/16/17 0636 10/18/17 0558  NA 133*   < > 140   < > 140 136 136 137 131*  K 4.1   < > 2.4*   < > 2.8* >7.5*  4.0 4.0 4.4  CL 96*   < > 113*   < > 112* 112* 108 108 102  CO2 <7*   < > 21*   < > 21* 18* 19* 23 22  GLUCOSE 765*   < > 102*   < >  63* 194* 230* 57* 153*  BUN 12   < > 10   < > 8 6 6  <5* <5*  CREATININE 1.58*   < > 0.90   < > 0.80 0.59* 0.64 0.58* 0.56*  CALCIUM 7.8*   < > 6.4*   < > 6.9* 6.8* 7.1* 7.4* 7.6*  MG  --   --  0.9*  --  1.6* 1.5*  --  2.0 1.3*  AST 32  --   --   --   --   --   --   --   --   ALT <5*  --   --   --   --   --   --   --   --   ALKPHOS 149*  --   --   --   --   --   --   --   --   BILITOT 1.9*  --   --   --   --   --   --   --   --    < > = values in this interval not displayed.   ------------------------------------------------------------------------------------------------------------------ estimated creatinine clearance is 98.9 mL/min (A) (by C-G formula based on SCr of 0.56 mg/dL (L)). ------------------------------------------------------------------------------------------------------------------ No results for input(s): HGBA1C in the last 72 hours. ------------------------------------------------------------------------------------------------------------------ No results for input(s): CHOL, HDL, LDLCALC, TRIG, CHOLHDL, LDLDIRECT in the last 72 hours. ------------------------------------------------------------------------------------------------------------------ No results for input(s): TSH, T4TOTAL, T3FREE, THYROIDAB in the last 72 hours.  Invalid input(s): FREET3 ------------------------------------------------------------------------------------------------------------------ No results for input(s): VITAMINB12, FOLATE, FERRITIN, TIBC, IRON, RETICCTPCT in the last 72 hours.  Coagulation profile No results for input(s): INR, PROTIME in the last 168 hours.  No results for input(s): DDIMER in the last 72 hours.  Cardiac Enzymes Recent Labs  Lab 10/13/17 1729 10/13/17 2013 10/14/17 0244  TROPONINI 0.03* 0.08* 0.20*    ------------------------------------------------------------------------------------------------------------------ Invalid input(s): POCBNP    Assessment & Plan  Patient 62 year old admitted with DKA  #1 acute DKA- now hypoglycemia Patient had turned on is insulin pump, we stopped again. Diabetic coordinator tried to set up his insulin pump, but not successful so patient's wife need to call the company for the pump get it restarted. Stopped scheduled meal time insuline. Monitor.   He had received a total of10 units insulin in last 3 days.   I will resume him on 10 units of Lantus as now he is increasing his oral intake, and keep him on sliding scale coverage with sensitive glucose monitoring.   As his pump is not functioning well, I have advised him to be discharged with insulin injections and he feels comfortable with that.  #2 anemia Panel consistent with anemia of chronic disease Recheck CBC - Hb stable now.  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  #4 cough with elevated procalcitonin  Chest x-ray is negative no indication for antibiotic  #5 acute kidney injury due to dehydration renal function now normal  #6 chronic heavy alcohol abuse with chronic hyponatremia monitor for withdrawal    No withdrawal signs.  #7 hypokalemia  Normal.  #8 bacteremia   Appreciated ID help- Rocephin.   Follow cx - repeated.   ID suggest to have CT abd / renal to r/o renal stone or pyelonephritis- done , negative .   Change to oral keflex- total 10 days Abx. Stop  date- 10/22/17.  #9 miscellaneous Lovenox for DVT prophylaxis     Code Status Orders  (From admission, onward)        Start     Ordered   10/13/17 1956  Full code  Continuous     10/13/17 1955    Code Status History    Date Active  Date Inactive Code Status Order ID Comments User Context   10/03/2017 0209 10/04/2017 1452 Full Code [id number]  Amelia Jo, MD Inpatient   03/19/2015 1939 03/21/2015 1928 Full Code [id number]  Bettey Costa, MD Inpatient   03/15/2015 2114 03/16/2015 1837 Full Code [id number]  Hower, Aaron Mose, MD ED      Consults tensive  DVT Prophylaxis  Lovenox  Lab Results  Component Value Date   PLT 427 10/16/2017     Time Spent in minutes 35 minutes spent  Greater than 50% of time spent in care coordination and counseling patient regarding the condition and plan of care.   Vaughan Basta M.D on 10/18/2017 at 3:10 PM  Between 7am to 6pm - Pager - [phone]  After 6pm go to [email or web address] - Proofreader  [employer]   Office  [phone]

## 2017-10-18 NOTE — Progress Notes (Signed)
[place] INFECTIOUS DISEASE PROGRESS NOTE Date of Admission:  10/13/2017     ID: JAHI ROZA is a 62 y.o. male with Kleb PNA bacteremia, UTI Active Problems:   DKA (diabetic ketoacidoses) ([place])   Subjective: No fevers, feels better.  ROS  Eleven systems are reviewed and negative except per hpi  Medications:  Antibiotics Given (last 72 hours)    Date/Time Action Medication Dose Rate   10/15/17 1628 New Bag/Given   aztreonam (AZACTAM) 2 g in sodium chloride 0.9 % 100 mL IVPB 2 g 200 mL/hr   10/15/17 2231 New Bag/Given   cefTRIAXone (ROCEPHIN) 2 g in sodium chloride 0.9 % 100 mL IVPB 2 g 200 mL/hr   10/16/17 1751 New Bag/Given   cefTRIAXone (ROCEPHIN) 2 g in sodium chloride 0.9 % 100 mL IVPB 2 g 200 mL/hr   10/17/17 1746 New Bag/Given   cefTRIAXone (ROCEPHIN) 2 g in sodium chloride 0.9 % 100 mL IVPB 2 g 200 mL/hr     . carbidopa-levodopa  1 tablet Oral QHS  . carbidopa-levodopa  2 tablet Oral TID PC  . Chlorhexidine Gluconate Cloth  6 each Topical Q0600  . folic acid  1 mg Oral Daily  . heparin  5,000 Units Subcutaneous Q8H  . insulin aspart  0-5 Units Subcutaneous QHS  . insulin aspart  0-9 Units Subcutaneous TID WC  . insulin glargine  10 Units Subcutaneous Daily  . Melatonin  2.5 mg Oral QHS  . multivitamin with minerals  1 tablet Oral Daily  . mupirocin ointment  1 application Nasal BID  . pantoprazole  40 mg Oral Daily  . sodium chloride flush  3 mL Intravenous Q12H  . thiamine  100 mg Oral Daily    Objective: Vital signs in last 24 hours: Temp:  [97.7 F (36.5 C)-98.1 F (36.7 C)] 97.7 F (36.5 C) (06/10 0445) Pulse Rate:  [73-114] 98 (06/10 0445) Resp:  [16-18] 18 (06/10 0445) BP: (102-138)/(61-70) 138/70 (06/10 0445) SpO2:  [99 %-100 %] 99 % (06/10 0445) Physical Exam  Constitutional: He is oriented to person, place, and time. He appears well-developed and well-nourished. No distress.  HENT:  Mouth/Throat: Oropharynx is clear and moist. No  oropharyngeal exudate.  Cardiovascular: Normal rate, regular rhythm and normal heart sounds. Exam reveals no gallop and no friction rub.  No murmur heard.  Pulmonary/Chest: Effort normal and breath sounds normal. No respiratory distress. He has no wheezes.  Abdominal: Soft. Bowel sounds are normal. He exhibits no distension. There is no tenderness.  Lymphadenopathy:  He has no cervical adenopathy.  Neurological: He is alert and oriented to person, place, and time.  Skin: Skin is warm and dry. No rash noted. No erythema.  Psychiatric: He has a normal mood and affect. His behavior is normal.    Lab Results Recent Labs    10/16/17 0636 10/18/17 0558  WBC 7.0  --   HGB 9.5*  --   HCT 28.1*  --   NA 137 131*  K 4.0 4.4  CL 108 102  CO2 23 22  BUN <5* <5*  CREATININE 0.58* 0.56*    Microbiology: Results for orders placed or performed during the hospital encounter of 10/13/17  Urine Culture     Status: Abnormal   Collection Time: 10/13/17  5:24 PM  Result Value Ref Range Status   Specimen Description   Final    URINE, RANDOM Performed at [facility], [address] [place], [place] [address]    Special  Requests   Final    NONE Performed at [facility], [address] [place], [place] [address]    Culture 80,000 COLONIES/mL KLEBSIELLA PNEUMONIAE (A)  Final   Report Status 10/16/2017 FINAL  Final   Organism ID, Bacteria KLEBSIELLA PNEUMONIAE (A)  Final      Susceptibility   Klebsiella pneumoniae - MIC*    AMPICILLIN >=32 RESISTANT Resistant     CEFAZOLIN <=4 SENSITIVE Sensitive     CEFTRIAXONE <=1 SENSITIVE Sensitive     CIPROFLOXACIN <=0.25 SENSITIVE Sensitive     GENTAMICIN <=1 SENSITIVE Sensitive     IMIPENEM <=0.25 SENSITIVE Sensitive     NITROFURANTOIN 32 SENSITIVE Sensitive     TRIMETH/SULFA <=20 SENSITIVE Sensitive     AMPICILLIN/SULBACTAM 8 SENSITIVE Sensitive     PIP/TAZO <=4 SENSITIVE Sensitive     Extended ESBL NEGATIVE Sensitive      * 80,000 COLONIES/mL KLEBSIELLA PNEUMONIAE  Blood Culture (routine x 2)     Status: Abnormal   Collection Time: 10/13/17  5:29 PM  Result Value Ref Range Status   Specimen Description   Final    BLOOD RIGHT ANTECUBITAL Performed at [facility], [address] [place], [place] [address]    Special Requests   Final    BOTTLES DRAWN AEROBIC AND ANAEROBIC Blood Culture adequate volume Performed at [facility], [address] [place], [place] [address]    Culture  Setup Time   Final    GRAM NEGATIVE RODS ANAEROBIC BOTTLE ONLY CRITICAL RESULT CALLED TO, READ BACK BY AND VERIFIED WITH: HANK ZOMPA ON 10/15/17 AT 1358 QSD Performed at [facility], [place] [address], [place], [place] [address]    Culture KLEBSIELLA PNEUMONIAE (A)  Final   Report Status 10/17/2017 FINAL  Final   Organism ID, Bacteria KLEBSIELLA PNEUMONIAE  Final      Susceptibility   Klebsiella pneumoniae - MIC*    AMPICILLIN >=32 RESISTANT Resistant     CEFAZOLIN <=4 SENSITIVE Sensitive     CEFEPIME <=1 SENSITIVE Sensitive     CEFTAZIDIME <=1 SENSITIVE Sensitive     CEFTRIAXONE <=1 SENSITIVE Sensitive     CIPROFLOXACIN <=0.25 SENSITIVE Sensitive     GENTAMICIN <=1 SENSITIVE Sensitive     IMIPENEM <=0.25 SENSITIVE Sensitive     TRIMETH/SULFA <=20 SENSITIVE Sensitive     AMPICILLIN/SULBACTAM 4 SENSITIVE Sensitive     PIP/TAZO <=4 SENSITIVE Sensitive     Extended ESBL NEGATIVE Sensitive     * KLEBSIELLA PNEUMONIAE  Blood Culture ID Panel (Reflexed)     Status: Abnormal   Collection Time: 10/13/17  5:29 PM  Result Value Ref Range Status   Enterococcus species NOT DETECTED NOT DETECTED Final   Listeria monocytogenes NOT DETECTED NOT DETECTED Final   Staphylococcus species NOT DETECTED NOT DETECTED Final   Staphylococcus aureus NOT DETECTED NOT DETECTED Final   Streptococcus species NOT DETECTED NOT DETECTED Final   Streptococcus agalactiae NOT DETECTED NOT DETECTED Final   Streptococcus  pneumoniae NOT DETECTED NOT DETECTED Final   Streptococcus pyogenes NOT DETECTED NOT DETECTED Final   Acinetobacter baumannii NOT DETECTED NOT DETECTED Final   Enterobacteriaceae species DETECTED (A) NOT DETECTED Final    Comment: Enterobacteriaceae represent a large family of gram-negative bacteria, not a single organism. CRITICAL RESULT CALLED TO, READ BACK BY AND VERIFIED WITH: HANK ZOMPA ON 10/15/17 AT 1358 QSD    Enterobacter cloacae complex NOT DETECTED NOT DETECTED Final   Escherichia coli NOT DETECTED NOT DETECTED Final  Klebsiella oxytoca NOT DETECTED NOT DETECTED Final   Klebsiella pneumoniae DETECTED (A) NOT DETECTED Final    Comment: CRITICAL RESULT CALLED TO, READ BACK BY AND VERIFIED WITH: HANK ZOMPA ON 10/15/17 AT 1358 QSD    Proteus species NOT DETECTED NOT DETECTED Final   Serratia marcescens NOT DETECTED NOT DETECTED Final   Carbapenem resistance NOT DETECTED NOT DETECTED Final   Haemophilus influenzae NOT DETECTED NOT DETECTED Final   Neisseria meningitidis NOT DETECTED NOT DETECTED Final   Pseudomonas aeruginosa NOT DETECTED NOT DETECTED Final   Candida albicans NOT DETECTED NOT DETECTED Final   Candida glabrata NOT DETECTED NOT DETECTED Final   Candida krusei NOT DETECTED NOT DETECTED Final   Candida parapsilosis NOT DETECTED NOT DETECTED Final   Candida tropicalis NOT DETECTED NOT DETECTED Final    Comment: Performed at [facility], [address] [place], [place] [address]  Blood Culture (routine x 2)     Status: None   Collection Time: 10/13/17  5:34 PM  Result Value Ref Range Status   Specimen Description BLOOD BLOOD RIGHT FOREARM  Final   Special Requests   Final    BOTTLES DRAWN AEROBIC AND ANAEROBIC Blood Culture adequate volume   Culture   Final    NO GROWTH 5 DAYS Performed at [facility], [address] [place], [place] [address]    Report Status 10/18/2017 FINAL  Final  MRSA PCR Screening     Status: Abnormal   Collection  Time: 10/13/17  8:00 PM  Result Value Ref Range Status   MRSA by PCR POSITIVE (A) NEGATIVE Final    Comment:        The GeneXpert MRSA Assay (FDA approved for NASAL specimens only), is one component of a comprehensive MRSA colonization surveillance program. It is not intended to diagnose MRSA infection nor to guide or monitor treatment for MRSA infections. RESULT CALLED TO, READ BACK BY AND VERIFIED WITH: DELL HOPKINS ON 10/13/17 AT 2228 JAG Performed at [facility], [address] [place], [place] [address]   CULTURE, BLOOD (ROUTINE X 2) w Reflex to ID Panel     Status: None (Preliminary result)   Collection Time: 10/15/17  3:39 PM  Result Value Ref Range Status   Specimen Description BLOOD RIGHT ANTECUBITAL  Final   Special Requests   Final    BOTTLES DRAWN AEROBIC AND ANAEROBIC Blood Culture adequate volume   Culture   Final    NO GROWTH 3 DAYS Performed at [facility], [address], [place], [place] [address]    Report Status PENDING  Incomplete  CULTURE, BLOOD (ROUTINE X 2) w Reflex to ID Panel     Status: None (Preliminary result)   Collection Time: 10/15/17  3:39 PM  Result Value Ref Range Status   Specimen Description BLOOD BLOOD RIGHT HAND  Final   Special Requests   Final    BOTTLES DRAWN AEROBIC AND ANAEROBIC Blood Culture adequate volume   Culture   Final    NO GROWTH 3 DAYS Performed at [facility], [address] [place], [place] [address]    Report Status PENDING  Incomplete    Studies/Results: Ct Renal Stone Study  Result Date: 10/17/2017 CLINICAL DATA:  Bacteremia.  Inpatient. EXAM: CT ABDOMEN AND PELVIS WITHOUT CONTRAST TECHNIQUE: Multidetector CT imaging of the abdomen and pelvis was performed following the standard protocol without IV contrast. COMPARISON:  None. FINDINGS: Lower chest: Small dependent bilateral pleural effusions with moderate dependent bibasilar  atelectasis. Low-density cardiac blood pool compatible  with anemia. Hepatobiliary: Normal liver size. Two scattered subcentimeter hypodense right liver lobe lesions, too small to characterize. No additional liver lesions. Possible tiny layering gallstone in the nondistended gallbladder with no gallbladder wall thickening or pericholecystic fluid. No biliary ductal dilatation. Pancreas: Cystic 2.4 x 1.9 cm pancreatic tail lesion (series 2/image 24). No additional pancreatic lesions. No pancreatic duct dilation. Spleen: Normal size. No mass. Adrenals/Urinary Tract: Normal adrenals. No renal stones. No hydronephrosis. No contour deforming renal masses. Normal caliber ureters, with no ureteral stones. Tiny focus of gas in the nondependent bladder, which is otherwise normal and nondistended. Stomach/Bowel: Normal non-distended stomach. Normal caliber small bowel with no small bowel wall thickening. Normal appendix. Normal large bowel with no diverticulosis, large bowel wall thickening or pericolonic fat stranding. Vascular/Lymphatic: Normal caliber abdominal aorta. No pathologically enlarged lymph nodes in the abdomen or pelvis. Reproductive: Top-normal size prostate. Other: No pneumoperitoneum, ascites or focal fluid collection. Mild anasarca. Symmetric mild to moderate gynecomastia. Small fat containing umbilical hernia. Musculoskeletal: No aggressive appearing focal osseous lesions. Mild L2, severe L3 and moderate L4 vertebral compression fractures of indeterminate chronicity. Moderate lumbar spondylosis. IMPRESSION: 1. Mild L2, severe L3 and moderate L4 vertebral compression fractures of indeterminate chronicity. 2. No urolithiasis.  No hydronephrosis. 3. Indeterminate 2.4 cm pancreatic tail cystic lesion. Further characterization with MRI abdomen without and with IV contrast is recommended, preferably on a short term outpatient basis. 4. Small dependent bilateral pleural effusions with moderate dependent bibasilar atelectasis. Mild anasarca. 5. Possible cholelithiasis,  with no CT findings of acute cholecystitis. 6. Nonspecific minimal gas in the nondependent bladder, presumably due to recent bladder instrumentation. Correlate with urinalysis if there is no history of recent bladder instrumentation to explain this gas. Electronically Signed   By: Ilona Sorrel M.D.   On: 10/17/2017 09:14    Assessment/Plan: CAYETANO MIKITA is a 62 y.o. male admitted with AMS, DKA, sepsis and found to have Klebsiella bacteremia from UTI. He denies urinary issues in the past, and states no prior UTI, bladder or prostate issues but his history is not completely reliable. He has a hx of multiple issues including PVD, Parkinson's, Dementia, ETOH Abuse, Multiple Duodenal Ulcers, HTN, GERD, Diabetes Mellitus, and COPD 6/10 - CT scan neg for any stones or pyelo. No fevers.   Recommendations Change to oral keflex for a 10 day total abx course (today is day 6)  Thank you very much for the consult. Will follow with you.  Leonel Ramsay   10/18/2017, 2:31 PM

## 2017-10-18 NOTE — Progress Notes (Signed)
Inpatient Diabetes Program Recommendations  AACE/ADA: New Consensus Statement on Inpatient Glycemic Control (2015)  Target Ranges:  Prepandial:   less than 140 mg/dL      Peak postprandial:   less than 180 mg/dL (1-2 hours)      Critically ill patients:  140 - 180 mg/dL   Results for John Reilly, John Reilly (MRN [id number]) as of 10/18/2017 08:40  Ref. Range 10/16/2017 08:17 10/16/2017 08:46 10/16/2017 09:04 10/16/2017 09:31 10/16/2017 11:40 10/16/2017 16:32 10/16/2017 20:25  Glucose-Capillary Latest Ref Range: 65 - 99 mg/dL 30 (LL) 33 (LL) 34 (LL) 132 (H) 173 (H) 218 (H)  Insulin Pump restarted by Patient 192 (H)   Results for John Reilly, John Reilly (MRN [id number]) as of 10/18/2017 08:40  Ref. Range 10/17/2017 02:23 10/17/2017 03:07 10/17/2017 05:58 10/17/2017 08:26 10/17/2017 12:02 10/17/2017 16:20 10/17/2017 20:38  Glucose-Capillary Latest Ref Range: 65 - 99 mg/dL 54 (L) 87 149 (H) 175 (H) 267 (H)  3 units NOVOLOG given by Pt per Insulin Pump at 2pm 252 (H)  3 units NOVOLOG given by Pt per Insulin Pump 212 (H)  2 units NOVOLOG given by Pt per Insulin Pump   Results for John Reilly, John Reilly (MRN [id number]) as of 10/18/2017 08:40  Ref. Range 10/18/2017 01:47 10/18/2017 07:40  Glucose-Capillary Latest Ref Range: 65 - 99 mg/dL 184 (H)  1 unit NOVOLOG given by Pt per Insulin Pump 132 (H)     History:Type 1DM(diagnosed 1988), Parkinson's, Dementia, ETOH  Home DM Meds:Insulin Pump  Current Insulin Orders: Insulin Pump     Patient has had 1 mild Hypoglycemic event since restarting Home Insulin Pump.  CBG this AM: 132 mg/dl.  Plan to see pt today.     --Will follow patient during hospitalization--  Wyn Quaker RN, MSN, CDE Diabetes Coordinator Inpatient Glycemic Control Team Team Pager: [phone] (8a-5p)

## 2017-10-18 NOTE — Clinical Social Work Note (Signed)
CSW was updated that PT is now recommending SNF for short term rehab.  CSW will meet with patient and family to discuss SNF placement and process at a later time.

## 2017-10-18 NOTE — Progress Notes (Signed)
Pharmacy Electrolyte Monitoring Consult:  Pharmacy consulted to assist in monitoring and replacing electrolytes in this 62 y.o. male admitted on 10/13/2017 with DKA.   Labs:  Sodium (mmol/L)  Date Value  10/18/2017 131 (L)   Potassium (mmol/L)  Date Value  10/18/2017 4.4   Magnesium (mg/dL)  Date Value  10/18/2017 1.3 (L)   Phosphorus (mg/dL)  Date Value  10/16/2017 2.5   Calcium (mg/dL)  Date Value  10/18/2017 7.6 (L)   Albumin (g/dL)  Date Value  10/15/2017 1.6 (L)   SCr 0.56, CrCl 98.9 ml/min  Assessment/Plan:  Mag 1.3 - will order mag 4 g IV x1 Recheck all labs in AM  Pharmacy will continue to monitor and adjust per consult.   Rocky Morel, PharmD, BCPS Clinical Pharmacist 10/18/2017 8:23 AM

## 2017-10-18 NOTE — Progress Notes (Signed)
Physical Therapy Treatment Patient Details Name: John Reilly MRN: [id number] DOB: [birth year] Today's Date: 10/18/2017    History of Present Illness Pt is a 62 yo male who here last week after a fall, Left proximal humeral fracture (now in sling, NWBing), Left 2nd rib fracutre.  He is now here with DKA (blood glucose was 732 on arrival), altered mental status. PMH: Parkinson's disease (diagnosed 6-9 MA), dementia, COPD, PVD, HTN, DM. In ED BG: 35, Na: 125 (pt reports as chronic), K:2.3. Pt reports estimated 4 falls in past 6 months which he attributes to parkinsonism. PMH: also includes severe Rt ankle fracture 5ya which has since limited mobility. Pt uses a QC at baseline, mostly AMB in the home.     PT Comments    Pt presents with deficits in strength, transfers, mobility, gait, balance, and activity tolerance.  Pt required extra time and effort with bed mobility tasks but no physical assistance.  Pt was unable to stand from EOB at standard height and then with EOB elevated pt required min A to stand and then min-mod assistance to prevent LOB.  Pt continues to present with short, staggering steps with min-mod instability requiring close CGA to min A for stability during amb with a SPC.  Overall pt is functionally weak and at a very high risk for falls.  Pt reports feeling significantly weaker and more unsteady than at baseline.  Pt will benefit from PT services in a SNF setting upon discharge to safely address above deficits for decreased caregiver assistance and eventual return to PLOF.       Follow Up Recommendations  SNF;Supervision for mobility/OOB     Equipment Recommendations  None recommended by PT    Recommendations for Other Services       Precautions / Restrictions Precautions Shoulder Interventions: Shoulder sling/immobilizer;At all times(LUE) Required Braces or Orthoses: Sling Restrictions Weight Bearing Restrictions: Yes LUE Weight Bearing: Non weight bearing     Mobility  Bed Mobility Overal bed mobility: Modified Independent Bed Mobility: Supine to Sit;Sit to Supine     Supine to sit: Modified independent (Device/Increase time) Sit to supine: Modified independent (Device/Increase time)   General bed mobility comments: Min increased time and effort but no physical assistance required  Transfers Overall transfer level: Needs assistance Equipment used: Straight cane Transfers: Sit to/from Stand Sit to Stand: Min assist;From elevated surface         General transfer comment: Min A to stand from EOB with mod instability upon initial stand requiring min A to steady pt and prevent LOB, poor eccentric control during stand to sit  Ambulation/Gait Ambulation/Gait assistance: Min assist Ambulation Distance (Feet): 15 Feet Assistive device: Straight cane Gait Pattern/deviations: Step-through pattern;Staggering right;Staggering left Gait velocity: Decreased   General Gait Details: Pt continues to present with short, staggering steps with min-mod instability requiring close CGA to min A for stability.   Stairs             Wheelchair Mobility    Modified Rankin (Stroke Patients Only)       Balance Overall balance assessment: Needs assistance;History of Falls Sitting-balance support: Single extremity supported Sitting balance-[name]: Good     Standing balance support: Single extremity supported Standing balance-[name]: Poor Standing balance comment: Pt required min-mod A for stability upon initial stand from EOB and during turn to sit back to EOB  Cognition Arousal/Alertness: Awake/alert Behavior During Therapy: WFL for tasks assessed/performed Overall Cognitive Status: Within Functional Limits for tasks assessed                                        Exercises Total Joint Exercises Hip ABduction/ADduction: AROM;Both;5 reps Straight Leg Raises: AROM;Both;5  reps Long Arc Quad: AROM;Both;10 reps;15 reps Knee Flexion: AROM;Both;10 reps;15 reps Marching in Standing: AROM;Both;10 reps;5 reps;Seated;Standing    General Comments        Pertinent Vitals/Pain Pain Assessment: 0-10 Pain Score: 5  Pain Location: Left shoulder Pain Descriptors / Indicators: Sore Pain Intervention(s): Monitored during session    Home Living                      Prior Function            PT Goals (current goals can now be found in the care plan section)      Frequency    Min 2X/week      PT Plan Discharge plan needs to be updated    Co-evaluation              AM-PAC PT "6 Clicks" Daily Activity  Outcome Measure                   End of Session Equipment Utilized During Treatment: Gait belt Activity Tolerance: Patient tolerated treatment well Patient left: in bed;with bed alarm set;with call bell/phone within reach Nurse Communication: Mobility status PT Visit Diagnosis: Unsteadiness on feet (R26.81);Repeated falls (R29.6);Difficulty in walking, not elsewhere classified (R26.2);Pain Pain - Right/Left: Left Pain - part of body: Shoulder     Time: 1536-1600 PT Time Calculation (min) (ACUTE ONLY): 24 min  Charges:  $Gait Training: 8-22 mins $Therapeutic Exercise: 8-22 mins                    G Codes:       DRoyetta Asal PT, DPT 10/18/17, 4:18 PM

## 2017-10-18 NOTE — Progress Notes (Signed)
Insulin pump has been disconnected and is stored in black bag that belongs to patient.

## 2017-10-18 NOTE — Plan of Care (Signed)
  Problem: Clinical Measurements: Goal: Will remain free from infection Outcome: Progressing   Problem: Clinical Measurements: Goal: Ability to maintain clinical measurements within normal limits will improve Outcome: Progressing

## 2017-10-18 NOTE — Progress Notes (Addendum)
Met with pt and his wife this AM (approxmately 10:30am).  Pt told me the nurses have been giving him insulin all weekend and that he had his pump turned off even though he was instructed to turn his pump back on on 06/08.  I told pt the nurses have not been giving him insulin b/c we have no orders to give him insulin.  Patient then told me he has given himself some injections with his needles and Humalog that he brought from home.  Patient was working on getting serial numbers placed into insulin pump this AM so that he could get connectivity between all three pump pieces (pump, transmitter/sensor, and CG meter).  Asked me to assist him, however, I am unable to assist him to place serial numbers into the pump b/c I have not been trained to do this task and this is outside my scope of expertise.  I strongly encouraged pt and wife to call the pump manufacturer to get assistance in entering serial numbers for the pump and sensor and CBG meter so that pt can have connectivity between all 3 devices.  I instructed pt to NOT reattach and to NOT restart his insulin pump at this time as Dr. Anselm Jungling would like for [place] to give pt Lantus and Novolog for now since there are still issues with the insulin pump.  I also instructed pt to NOT give any more injections of Humalog to himself with his home supply of insulin and syringes.  Have placed a call to pt's ENDO (Dr. Honor Junes) to see about getting pt an appt for this Thursday.  After conferring with Dr. Anselm Jungling, I believe the plan will be to discharge pt home on Lantus and Novolog until pt can be seen by Outpatient ENDO team for assistance with pump.     --Will follow patient during hospitalization--  Wyn Quaker RN, MSN, CDE Diabetes Coordinator Inpatient Glycemic Control Team Team Pager: [phone] (8a-5p)

## 2017-10-18 NOTE — Progress Notes (Signed)
Critical blood glucose 26, MD notified, hypoglycemic protocol  implemented,  patient asymptomatic will continue to monitor

## 2017-10-19 ENCOUNTER — Other Ambulatory Visit: Payer: Self-pay

## 2017-10-19 LAB — GLUCOSE, CAPILLARY
GLUCOSE-CAPILLARY: 151 mg/dL — AB (ref 65–99)
GLUCOSE-CAPILLARY: 99 mg/dL (ref 65–99)
GLUCOSE-CAPILLARY: 147 mg/dL — AB (ref 65–99)
Glucose-Capillary: 26 mg/dL — CL (ref 65–99)
Glucose-Capillary: 268 mg/dL — ABNORMAL HIGH (ref 65–99)

## 2017-10-19 LAB — BASIC METABOLIC PANEL
ANION GAP: 8 (ref 5–15)
BUN: 5 mg/dL — ABNORMAL LOW (ref 6–20)
CALCIUM: 7.2 mg/dL — AB (ref 8.9–10.3)
CHLORIDE: 99 mmol/L — AB (ref 101–111)
CO2: 23 mmol/L (ref 22–32)
Creatinine, Ser: 0.54 mg/dL — ABNORMAL LOW (ref 0.61–1.24)
GFR calc Af Amer: >60 mL/min (ref >60)
GFR calc non Af Amer: >60 mL/min (ref >60)
GLUCOSE: 268 mg/dL — AB (ref 65–99)
POTASSIUM: 4.5 mmol/L (ref 3.5–5.1)
Sodium: 130 mmol/L — ABNORMAL LOW (ref 135–145)

## 2017-10-19 LAB — CBC
HEMATOCRIT: 28.1 % — AB (ref 40.0–52.0)
HEMOGLOBIN: 9.1 g/dL — AB (ref 13.0–18.0)
MCH: 34.3 pg — ABNORMAL HIGH (ref 26.0–34.0)
MCHC: 32.5 g/dL (ref 32.0–36.0)
MCV: 105.4 fL — ABNORMAL HIGH (ref 80.0–100.0)
Platelets: 374 10*3/uL (ref 150–440)
RBC: 2.67 MIL/uL — AB (ref 4.40–5.90)
RDW: 14.9 % — ABNORMAL HIGH (ref 11.5–14.5)
WBC: 4.5 10*3/uL (ref 3.8–10.6)

## 2017-10-19 LAB — MAGNESIUM: Magnesium: 1.8 mg/dL (ref 1.7–2.4)

## 2017-10-19 MED ORDER — METOPROLOL SUCCINATE ER 50 MG PO TB24
50.0000 mg | ORAL_TABLET | Freq: Every day | ORAL | Status: DC
  Administered 2017-10-20: 50 mg via ORAL
  Filled 2017-10-19: qty 1

## 2017-10-19 MED ORDER — SENNOSIDES-DOCUSATE SODIUM 8.6-50 MG PO TABS
1.0000 | ORAL_TABLET | Freq: Two times a day (BID) | ORAL | Status: DC
  Administered 2017-10-19 – 2017-10-20 (×2): 1 via ORAL
  Filled 2017-10-19 (×2): qty 1

## 2017-10-19 MED ORDER — INSULIN ASPART 100 UNIT/ML ~~LOC~~ SOLN
0.0000 [IU] | Freq: Three times a day (TID) | SUBCUTANEOUS | Status: DC
  Administered 2017-10-20 (×2): 2 [IU] via SUBCUTANEOUS
  Filled 2017-10-19 (×2): qty 1

## 2017-10-19 MED ORDER — INSULIN GLARGINE 100 UNIT/ML ~~LOC~~ SOLN
5.0000 [IU] | Freq: Every day | SUBCUTANEOUS | Status: DC
  Administered 2017-10-19 – 2017-10-20 (×2): 5 [IU] via SUBCUTANEOUS
  Filled 2017-10-19 (×2): qty 0.05

## 2017-10-19 NOTE — Care Management (Signed)
Per [facility] handoff- [facility] has [facility] for [facility] and opted out of SNF with plans to return to home.

## 2017-10-19 NOTE — Care Management Note (Signed)
Case Management Note  Patient Details  Name: John Reilly MRN: [id number] Date of Birth: 10/26/1955  Subjective/Objective: Spoke with wife regarding discharge planning. She is open to taking patient home with PT/OT. He will be alone during the day. He has a bedside commode.He is unable to use the walker at this time due to the shoulder injury. Wife feels more comfortable now that OT has been added. Awaiting discharge by attending                   Action/Plan:   Expected Discharge Date:                  Expected Discharge Plan:  [place]  In-House Referral:     Discharge planning Services  CM Consult  Post Acute Care Choice:  Resumption of Svcs/PTA Provider, Home Health Choice offered to:  Spouse  DME Arranged:    DME Agency:     HH Arranged:  PT, OT HH Agency:  [facility]  Status of Service:  In process, will continue to follow  If discussed at Long Length of Stay Meetings, dates discussed:    Additional Comments:  Jolly Mango, RN 10/19/2017, 11:23 AM

## 2017-10-19 NOTE — Progress Notes (Signed)
[place] at [facility]                                                                                                                                                                                  Patient Demographics   John Reilly, is a 62 y.o. male, DOB - November 03, 1955, YOV:[id number]  Admit date - 10/13/2017   Admitting Physician Gorden Harms, MD  Outpatient Primary MD for the patient is Glendon Axe, MD   LOS - 6  Subjective: Feeling better still very weak Blood sugar under control, as per wife- very weak. Had hypoglycemia again yesterday.  Review of Systems:   CONSTITUTIONAL: No documented fever. No fatigue, positive weakness. No weight gain, no weight loss.  EYES: No blurry or double vision.  ENT: No tinnitus. No postnasal drip. No redness of the oropharynx.  RESPIRATORY: Positive cough, no wheeze, no hemoptysis. No dyspnea.  CARDIOVASCULAR: No chest pain. No orthopnea. No palpitations. No syncope.  GASTROINTESTINAL: No nausea, no vomiting or diarrhea. No abdominal pain. No melena or hematochezia.  GENITOURINARY: No dysuria or hematuria.  ENDOCRINE: No polyuria or nocturia. No heat or cold intolerance.  HEMATOLOGY: No anemia. No bruising. No bleeding.  INTEGUMENTARY: No rashes. No lesions.  MUSCULOSKELETAL: No arthritis. No swelling. No gout.  NEUROLOGIC: No numbness, tingling, or ataxia. No seizure-type activity.  PSYCHIATRIC: No anxiety. No insomnia. No ADD.    Vitals:   Vitals:   10/18/17 1722 10/18/17 1917 10/19/17 0359 10/19/17 0754  BP: 117/65 117/73 135/67 132/77  Pulse: (!) 34 (!) 102 82 87  Resp: 17 17 17    Temp: 98.3 F (36.8 C) 98 F (36.7 C) 97.8 F (36.6 C) 98 F (36.7 C)  TempSrc:  Oral Oral Oral  SpO2: (!) 81% 96% 100% 96%  Weight:      Height:        Wt Readings from Last 3 Encounters:  10/14/17 78.7 kg (173 lb 8 oz)  10/04/17 73 kg (161 lb)  10/12/16 85.7 kg (189 lb)     Intake/Output Summary (Last 24  hours) at 10/19/2017 1323 Last data filed at 10/19/2017 1130 Gross per 24 hour  Intake -  Output 3075 ml  Net -3075 ml    Physical Exam:   GENERAL: Pleasant-appearing in no apparent distress.  HEAD, EYES, EARS, NOSE AND THROAT: Atraumatic, normocephalic. Extraocular muscles are intact. Pupils equal and reactive to light. Sclerae anicteric. No conjunctival injection. No oro-pharyngeal erythema.  NECK: Supple. There is no jugular venous distention. No bruits, no lymphadenopathy, no thyromegaly.  HEART: Regular rate and rhythm,. No murmurs, no rubs, no clicks.  LUNGS: Rhonchus breath sounds bilaterally  aBDOMEN: Soft,  flat, nontender, nondistended. Has good bowel sounds. No hepatosplenomegaly appreciated.  EXTREMITIES: No evidence of any cyanosis, clubbing, or peripheral edema.  +2 pedal and radial pulses bilaterally. Left arm in a arm rest. NEUROLOGIC: The patient is alert, awake, and oriented x3 with no focal motor or sensory deficits appreciated bilaterally. Generalized weakness. SKIN: Moist and warm with no rashes appreciated.  Psych: Not anxious, depressed LN: No inguinal LN enlargement    Antibiotics   Anti-infectives (From admission, onward)   Start     Dose/Rate Route Frequency Ordered Stop   10/18/17 1500  cephALEXin (KEFLEX) capsule 500 mg     500 mg Oral Every 8 hours 10/18/17 1445 10/23/17 1359   10/16/17 1800  cefTRIAXone (ROCEPHIN) 2 g in sodium chloride 0.9 % 100 mL IVPB  Status:  Discontinued     2 g 200 mL/hr over 30 Minutes Intravenous Every 24 hours 10/16/17 0808 10/18/17 1445   10/15/17 2200  cefTRIAXone (ROCEPHIN) 2 g in sodium chloride 0.9 % 100 mL IVPB  Status:  Discontinued     2 g 200 mL/hr over 30 Minutes Intravenous Every 24 hours 10/15/17 2156 10/16/17 0808   10/15/17 1500  aztreonam (AZACTAM) 2 g in sodium chloride 0.9 % 100 mL IVPB  Status:  Discontinued     2 g 200 mL/hr over 30 Minutes Intravenous Every 8 hours 10/15/17 1455 10/15/17 2156   10/14/17  1800  levofloxacin (LEVAQUIN) IVPB 750 mg  Status:  Discontinued     750 mg 100 mL/hr over 90 Minutes Intravenous Every 24 hours 10/14/17 0339 10/14/17 1004   10/14/17 1000  vancomycin (VANCOCIN) IVPB 1000 mg/200 mL premix  Status:  Discontinued     1,000 mg 200 mL/hr over 60 Minutes Intravenous Every 12 hours 10/14/17 0348 10/14/17 1004   10/14/17 0600  aztreonam (AZACTAM) 1 g in sodium chloride 0.9 % 100 mL IVPB  Status:  Discontinued     1 g 200 mL/hr over 30 Minutes Intravenous Every 12 hours 10/14/17 0339 10/14/17 1004   10/13/17 1800  levofloxacin (LEVAQUIN) IVPB 750 mg     750 mg 100 mL/hr over 90 Minutes Intravenous  Once 10/13/17 1752 10/13/17 1935   10/13/17 1800  aztreonam (AZACTAM) 2 g in sodium chloride 0.9 % 100 mL IVPB     2 g 200 mL/hr over 30 Minutes Intravenous  Once 10/13/17 1752 10/13/17 1837   10/13/17 1800  vancomycin (VANCOCIN) IVPB 1000 mg/200 mL premix     1,000 mg 200 mL/hr over 60 Minutes Intravenous  Once 10/13/17 1752 10/14/17 0439      Medications   Scheduled Meds: . carbidopa-levodopa  1 tablet Oral QHS  . carbidopa-levodopa  2 tablet Oral TID PC  . cephALEXin  500 mg Oral Q8H  . Chlorhexidine Gluconate Cloth  6 each Topical Q0600  . folic acid  1 mg Oral Daily  . heparin  5,000 Units Subcutaneous Q8H  . insulin aspart  0-4 Units Subcutaneous TID WC  . insulin glargine  5 Units Subcutaneous Daily  . Melatonin  2.5 mg Oral QHS  . multivitamin with minerals  1 tablet Oral Daily  . mupirocin ointment  1 application Nasal BID  . pantoprazole  40 mg Oral Daily  . sodium chloride flush  3 mL Intravenous Q12H  . thiamine  100 mg Oral Daily   Continuous Infusions:  PRN Meds:.acetaminophen, amLODipine, morphine injection, oxyCODONE-acetaminophen   Data Review:   Micro Results Recent Results (from the past 240 hour(s))  Urine Culture     Status: Abnormal   Collection Time: 10/13/17  5:24 PM  Result Value Ref Range Status   Specimen Description    Final    URINE, RANDOM Performed at [facility], [address] [place], [place] [address]    Special Requests   Final    NONE Performed at [facility], [place], [place] [address]    Culture 80,000 COLONIES/mL KLEBSIELLA PNEUMONIAE (A)  Final   Report Status 10/16/2017 FINAL  Final   Organism ID, Bacteria KLEBSIELLA PNEUMONIAE (A)  Final      Susceptibility   Klebsiella pneumoniae - MIC*    AMPICILLIN >=32 RESISTANT Resistant     CEFAZOLIN <=4 SENSITIVE Sensitive     CEFTRIAXONE <=1 SENSITIVE Sensitive     CIPROFLOXACIN <=0.25 SENSITIVE Sensitive     GENTAMICIN <=1 SENSITIVE Sensitive     IMIPENEM <=0.25 SENSITIVE Sensitive     NITROFURANTOIN 32 SENSITIVE Sensitive     TRIMETH/SULFA <=20 SENSITIVE Sensitive     AMPICILLIN/SULBACTAM 8 SENSITIVE Sensitive     PIP/TAZO <=4 SENSITIVE Sensitive     Extended ESBL NEGATIVE Sensitive     * 80,000 COLONIES/mL KLEBSIELLA PNEUMONIAE  Blood Culture (routine x 2)     Status: Abnormal   Collection Time: 10/13/17  5:29 PM  Result Value Ref Range Status   Specimen Description   Final    BLOOD RIGHT ANTECUBITAL Performed at [facility], [address], [place], [place] [address]    Special Requests   Final    BOTTLES DRAWN AEROBIC AND ANAEROBIC Blood Culture adequate volume Performed at [facility], [address] [place],  [address]    Culture  Setup Time   Final    GRAM NEGATIVE RODS ANAEROBIC BOTTLE ONLY CRITICAL RESULT CALLED TO, READ BACK BY AND VERIFIED WITH: HANK ZOMPA ON 10/15/17 AT 1358 QSD Performed at [facility], [place] [address], [place], [place] [address]    Culture KLEBSIELLA PNEUMONIAE (A)  Final   Report Status 10/17/2017 FINAL  Final   Organism ID, Bacteria KLEBSIELLA PNEUMONIAE  Final      Susceptibility   Klebsiella pneumoniae - MIC*    AMPICILLIN >=32 RESISTANT Resistant     CEFAZOLIN <=4 SENSITIVE Sensitive     CEFEPIME <=1 SENSITIVE  Sensitive     CEFTAZIDIME <=1 SENSITIVE Sensitive     CEFTRIAXONE <=1 SENSITIVE Sensitive     CIPROFLOXACIN <=0.25 SENSITIVE Sensitive     GENTAMICIN <=1 SENSITIVE Sensitive     IMIPENEM <=0.25 SENSITIVE Sensitive     TRIMETH/SULFA <=20 SENSITIVE Sensitive     AMPICILLIN/SULBACTAM 4 SENSITIVE Sensitive     PIP/TAZO <=4 SENSITIVE Sensitive     Extended ESBL NEGATIVE Sensitive     * KLEBSIELLA PNEUMONIAE  Blood Culture ID Panel (Reflexed)     Status: Abnormal   Collection Time: 10/13/17  5:29 PM  Result Value Ref Range Status   Enterococcus species NOT DETECTED NOT DETECTED Final   Listeria monocytogenes NOT DETECTED NOT DETECTED Final   Staphylococcus species NOT DETECTED NOT DETECTED Final   Staphylococcus aureus NOT DETECTED NOT DETECTED Final   Streptococcus species NOT DETECTED NOT DETECTED Final   Streptococcus agalactiae NOT DETECTED NOT DETECTED Final   Streptococcus pneumoniae NOT DETECTED NOT DETECTED Final   Streptococcus pyogenes NOT DETECTED NOT DETECTED Final   Acinetobacter baumannii NOT DETECTED NOT DETECTED Final   Enterobacteriaceae species DETECTED (A) NOT DETECTED Final  Comment: Enterobacteriaceae represent a large family of gram-negative bacteria, not a single organism. CRITICAL RESULT CALLED TO, READ BACK BY AND VERIFIED WITH: HANK ZOMPA ON 10/15/17 AT 1358 QSD    Enterobacter cloacae complex NOT DETECTED NOT DETECTED Final   Escherichia coli NOT DETECTED NOT DETECTED Final   Klebsiella oxytoca NOT DETECTED NOT DETECTED Final   Klebsiella pneumoniae DETECTED (A) NOT DETECTED Final    Comment: CRITICAL RESULT CALLED TO, READ BACK BY AND VERIFIED WITH: HANK ZOMPA ON 10/15/17 AT 1358 QSD    Proteus species NOT DETECTED NOT DETECTED Final   Serratia marcescens NOT DETECTED NOT DETECTED Final   Carbapenem resistance NOT DETECTED NOT DETECTED Final   Haemophilus influenzae NOT DETECTED NOT DETECTED Final   Neisseria meningitidis NOT DETECTED NOT DETECTED Final    Pseudomonas aeruginosa NOT DETECTED NOT DETECTED Final   Candida albicans NOT DETECTED NOT DETECTED Final   Candida glabrata NOT DETECTED NOT DETECTED Final   Candida krusei NOT DETECTED NOT DETECTED Final   Candida parapsilosis NOT DETECTED NOT DETECTED Final   Candida tropicalis NOT DETECTED NOT DETECTED Final    Comment: Performed at [facility], [address] [place], [place] [address]  Blood Culture (routine x 2)     Status: None   Collection Time: 10/13/17  5:34 PM  Result Value Ref Range Status   Specimen Description BLOOD BLOOD RIGHT FOREARM  Final   Special Requests   Final    BOTTLES DRAWN AEROBIC AND ANAEROBIC Blood Culture adequate volume   Culture   Final    NO GROWTH 5 DAYS Performed at [facility], [address] [place], [place] [address]    Report Status 10/18/2017 FINAL  Final  MRSA PCR Screening     Status: Abnormal   Collection Time: 10/13/17  8:00 PM  Result Value Ref Range Status   MRSA by PCR POSITIVE (A) NEGATIVE Final    Comment:        The GeneXpert MRSA Assay (FDA approved for NASAL specimens only), is one component of a comprehensive MRSA colonization surveillance program. It is not intended to diagnose MRSA infection nor to guide or monitor treatment for MRSA infections. RESULT CALLED TO, READ BACK BY AND VERIFIED WITH: DELL HOPKINS ON 10/13/17 AT 2228 JAG Performed at [facility], [address] [place], [place] [address]   CULTURE, BLOOD (ROUTINE X 2) w Reflex to ID Panel     Status: None (Preliminary result)   Collection Time: 10/15/17  3:39 PM  Result Value Ref Range Status   Specimen Description BLOOD RIGHT ANTECUBITAL  Final   Special Requests   Final    BOTTLES DRAWN AEROBIC AND ANAEROBIC Blood Culture adequate volume   Culture   Final    NO GROWTH 4 DAYS Performed at [facility], [address] [place], [place] [address]    Report Status PENDING  Incomplete  CULTURE, BLOOD (ROUTINE X  2) w Reflex to ID Panel     Status: None (Preliminary result)   Collection Time: 10/15/17  3:39 PM  Result Value Ref Range Status   Specimen Description BLOOD BLOOD RIGHT HAND  Final   Special Requests   Final    BOTTLES DRAWN AEROBIC AND ANAEROBIC Blood Culture adequate volume   Culture   Final    NO GROWTH 4 DAYS Performed at [facility], [address], [place], [place] [address]    Report Status PENDING  Incomplete    Radiology Reports  Dg Chest 1 View  Result Date: 10/16/2017 CLINICAL DATA:  Hypoxia EXAM: CHEST  1 VIEW COMPARISON:  October 13, 2017 FINDINGS: There is patchy opacity in the right base concerning for early pneumonia. There is focal consolidation medial left base. Lungs elsewhere clear. Heart size and pulmonary vascularity are normal. No adenopathy. There is evidence of a prior fracture of the proximal left humeral metaphysis with displacement of fracture fragments. There old rib fractures on the left. IMPRESSION: Consolidation medial left base. Hazy opacity right base, also felt to represent pneumonia. Heart size is within normal limits.  No adenopathy evident. Displaced fracture proximal left humeral metaphysis. Electronically Signed   By: Lowella Grip III M.D.   On: 10/16/2017 13:10   Dg Chest 1 View  Result Date: 10/02/2017 CLINICAL DATA:  Generalized weakness, recent history of fall EXAM: CHEST  1 VIEW COMPARISON:  03/19/2015 FINDINGS: No focal consolidation or effusion. Scarring at the left lung base. Stable cardiomediastinal silhouette. No pneumothorax. Old left-sided rib fracture. Suspected acute displaced right second rib fracture. Mildly displaced fracture involving the proximal shaft of the left humerus. IMPRESSION: 1. Mild scarring at the left lung base.  No acute opacity 2. Acute mildly displaced fracture proximal shaft of the left humerus. Acute appearing displaced right second rib fracture. Electronically Signed   By: Donavan Foil M.D.   On: 10/02/2017  23:50   Ct Head Wo Contrast  Result Date: 10/03/2017 CLINICAL DATA:  Fall with bruising to the left eye EXAM: CT HEAD WITHOUT CONTRAST TECHNIQUE: Contiguous axial images were obtained from the base of the skull through the vertex without intravenous contrast. COMPARISON:  MRI 12/16/2016 FINDINGS: Brain: No acute territorial infarction, hemorrhage or intracranial mass is visualized. Moderate atrophy. Slight asymmetric enlargement of left anterior convexity extra-axial CSF density. Mild small vessel ischemic changes of the white matter. Nonenlarged ventricles. Vascular: No hyperdense vessels. Scattered calcifications at the carotid siphon Skull: Normal. Negative for fracture or focal lesion. Sinuses/Orbits: No acute finding. Other: Mild left periorbital soft tissue swelling IMPRESSION: 1. No CT evidence for acute intracranial abnormality. 2. Atrophy and mild small vessel ischemic changes of the white matter 3. 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 Electronically Signed   By: Donavan Foil M.D.   On: 10/03/2017 00:04   Dg Chest Port 1 View  Result Date: 10/13/2017 CLINICAL DATA:  Altered mental status. EXAM: PORTABLE CHEST 1 VIEW COMPARISON:  10/02/2017 FINDINGS: Minimal scarring at the left base. No acute airspace opacities or effusions. Heart is normal size. No acute bony abnormality. IMPRESSION: No active disease. Electronically Signed   By: Rolm Baptise M.D.   On: 10/13/2017 18:12   Dg Shoulder Left  Result Date: 10/02/2017 CLINICAL DATA:  Generalized weakness since this morning. Patient fell yesterday and hit the left shoulder. Limited movement. EXAM: LEFT SHOULDER - 2+ VIEW COMPARISON:  None. FINDINGS: Comminuted fractures of the left humeral head and neck with transverse fracture line across the surgical neck and vertical fracture lines extending to the greater tuberosity. There is impaction of fracture fragments  with lateral angulation of the distal humeral fracture fragment. No dislocation at the shoulder joint. Multiple tiny displaced fragments are visualized. Visualized clavicle, scapula, and acromion appear intact. IMPRESSION: Comminuted fractures of the left humeral head and neck with impaction of fracture fragments and lateral angulation of the distal fracture fragment. Electronically Signed   By: Lucienne Capers M.D.   On: 10/02/2017 23:47   Ct  Renal Stone Study  Result Date: 10/17/2017 CLINICAL DATA:  Bacteremia.  Inpatient. EXAM: CT ABDOMEN AND PELVIS WITHOUT CONTRAST TECHNIQUE: Multidetector CT imaging of the abdomen and pelvis was performed following the standard protocol without IV contrast. COMPARISON:  None. FINDINGS: Lower chest: Small dependent bilateral pleural effusions with moderate dependent bibasilar atelectasis. Low-density cardiac blood pool compatible with anemia. Hepatobiliary: Normal liver size. Two scattered subcentimeter hypodense right liver lobe lesions, too small to characterize. No additional liver lesions. Possible tiny layering gallstone in the nondistended gallbladder with no gallbladder wall thickening or pericholecystic fluid. No biliary ductal dilatation. Pancreas: Cystic 2.4 x 1.9 cm pancreatic tail lesion (series 2/image 24). No additional pancreatic lesions. No pancreatic duct dilation. Spleen: Normal size. No mass. Adrenals/Urinary Tract: Normal adrenals. No renal stones. No hydronephrosis. No contour deforming renal masses. Normal caliber ureters, with no ureteral stones. Tiny focus of gas in the nondependent bladder, which is otherwise normal and nondistended. Stomach/Bowel: Normal non-distended stomach. Normal caliber small bowel with no small bowel wall thickening. Normal appendix. Normal large bowel with no diverticulosis, large bowel wall thickening or pericolonic fat stranding. Vascular/Lymphatic: Normal caliber abdominal aorta. No pathologically enlarged lymph nodes in  the abdomen or pelvis. Reproductive: Top-normal size prostate. Other: No pneumoperitoneum, ascites or focal fluid collection. Mild anasarca. Symmetric mild to moderate gynecomastia. Small fat containing umbilical hernia. Musculoskeletal: No aggressive appearing focal osseous lesions. Mild L2, severe L3 and moderate L4 vertebral compression fractures of indeterminate chronicity. Moderate lumbar spondylosis. IMPRESSION: 1. Mild L2, severe L3 and moderate L4 vertebral compression fractures of indeterminate chronicity. 2. No urolithiasis.  No hydronephrosis. 3. Indeterminate 2.4 cm pancreatic tail cystic lesion. Further characterization with MRI abdomen without and with IV contrast is recommended, preferably on a short term outpatient basis. 4. Small dependent bilateral pleural effusions with moderate dependent bibasilar atelectasis. Mild anasarca. 5. Possible cholelithiasis, with no CT findings of acute cholecystitis. 6. Nonspecific minimal gas in the nondependent bladder, presumably due to recent bladder instrumentation. Correlate with urinalysis if there is no history of recent bladder instrumentation to explain this gas. Electronically Signed   By: Ilona Sorrel M.D.   On: 10/17/2017 09:14     CBC Recent Labs  Lab 10/13/17 1729 10/13/17 2013 10/14/17 0804 10/15/17 0527 10/16/17 0636 10/19/17 0504  WBC 11.2* 13.0* 11.0* 6.5 7.0 4.5  HGB 10.0* 8.6* 7.3* 7.4* 9.5* 9.1*  HCT 33.2* 27.5* 21.4* 21.7* 28.1* 28.1*  PLT 668* 480* 434 389 427 374  MCV 116.4* 113.8* 101.3* 103.2* 102.3* 105.4*  MCH 35.0* 35.7* 34.5* 35.0* 34.6* 34.3*  MCHC 30.1* 31.4* 34.0 33.9 33.8 32.5  RDW 16.2* 15.6* 14.5 14.9* 14.4 14.9*  LYMPHSABS 2.5  --  1.3  --   --   --   MONOABS 1.0  --  0.8  --   --   --   EOSABS 0.0  --  0.0  --   --   --   BASOSABS 0.1  --  0.0  --   --   --     Chemistries  Recent Labs  Lab 10/13/17 1729  10/14/17 1936 10/15/17 0527 10/15/17 0829 10/16/17 0636 10/18/17 0558 10/19/17 0713  NA  133*   < > 140 136 136 137 131* 130*  K 4.1   < > 2.8* >7.5* 4.0 4.0 4.4 4.5  CL 96*   < > 112* 112* 108 108 102 99*  CO2 <7*   < > 21* 18* 19* 23 22 23   GLUCOSE 765*   < >  63* 194* 230* 57* 153* 268*  BUN 12   < > 8 6 6  <5* <5* 5*  CREATININE 1.58*   < > 0.80 0.59* 0.64 0.58* 0.56* 0.54*  CALCIUM 7.8*   < > 6.9* 6.8* 7.1* 7.4* 7.6* 7.2*  MG  --    < > 1.6* 1.5*  --  2.0 1.3* 1.8  AST 32  --   --   --   --   --   --   --   ALT <5*  --   --   --   --   --   --   --   ALKPHOS 149*  --   --   --   --   --   --   --   BILITOT 1.9*  --   --   --   --   --   --   --    < > = values in this interval not displayed.   ------------------------------------------------------------------------------------------------------------------ estimated creatinine clearance is 98.9 mL/min (A) (by C-G formula based on SCr of 0.54 mg/dL (L)). ------------------------------------------------------------------------------------------------------------------ No results for input(s): HGBA1C in the last 72 hours. ------------------------------------------------------------------------------------------------------------------ No results for input(s): CHOL, HDL, LDLCALC, TRIG, CHOLHDL, LDLDIRECT in the last 72 hours. ------------------------------------------------------------------------------------------------------------------ No results for input(s): TSH, T4TOTAL, T3FREE, THYROIDAB in the last 72 hours.  Invalid input(s): FREET3 ------------------------------------------------------------------------------------------------------------------ No results for input(s): VITAMINB12, FOLATE, FERRITIN, TIBC, IRON, RETICCTPCT in the last 72 hours.  Coagulation profile No results for input(s): INR, PROTIME in the last 168 hours.  No results for input(s): DDIMER in the last 72 hours.  Cardiac Enzymes Recent Labs  Lab 10/13/17 1729 10/13/17 2013 10/14/17 0244  TROPONINI 0.03* 0.08* 0.20*    ------------------------------------------------------------------------------------------------------------------ Invalid input(s): POCBNP    Assessment & Plan  Patient 62 year old admitted with DKA  #1 acute DKA- now hypoglycemia Patient had turned on is insulin pump, we stopped again. Diabetic coordinator tried to set up his insulin pump, but not successful so patient's wife need to call the company for the pump get it restarted. Stopped scheduled meal time insuline. Monitor.  had resume him on 10 units of Lantus as now he is increasing his oral intake, and keep him on sliding scale coverage with sensitive glucose monitoring on 10/18/17. Again had hypoglycemia on 10/18/17 afternoon, decrease lantus to 5 mg and keep sliding scale on ultra sensitive. Monitor with that for 1 days. If blood sugar drops now, will check Insuline and C peptide.   As his pump is not functioning well, I have advised him to be discharged with insulin injections and he feels comfortable with that.  #2 anemia Panel consistent with anemia of chronic disease Recheck CBC - Hb stable now.  #3 Acute encephalopathy most likely secondary to above, compounded by Parkinson's disease with dementia and heavy alcohol abuse improved PT evaluation- suggest home health, wife still have concerns- spoke to case manager and PT.  he was weaker last time with PT, so suggest rehab, I requested re-eval.  #4 cough with elevated procalcitonin  Chest x-ray is negative no indication for antibiotic.  #5 acute kidney injury due to dehydration renal function now normal  #6 chronic heavy alcohol abuse with chronic hyponatremia monitor for withdrawal    No withdrawal signs.  #7 hypokalemia  Normal.  #8 bacteremia   Appreciated ID help- Rocephin.   Follow cx - repeated.   ID suggest to have CT abd / renal to r/o renal stone or pyelonephritis- done , negative .  Change to oral keflex- total 10 days Abx. Stop date- 10/22/17.  #9  miscellaneous Lovenox for DVT prophylaxis     Code Status Orders  (From admission, onward)        Start     Ordered   10/13/17 1956  Full code  Continuous     10/13/17 1955    Code Status History    Date Active Date Inactive Code Status Order ID Comments User Context   10/03/2017 0209 10/04/2017 1452 Full Code [id number]  Amelia Jo, MD Inpatient   03/19/2015 1939 03/21/2015 1928 Full Code [id number]  Bettey Costa, MD Inpatient   03/15/2015 2114 03/16/2015 1837 Full Code [id number]  Hower, Aaron Mose, MD ED      Consults tensive  DVT Prophylaxis  Lovenox  Lab Results  Component Value Date   PLT 374 10/19/2017     Time Spent in minutes 35 minutes spent  Greater than 50% of time spent in care coordination and counseling patient regarding the condition and plan of care.   Vaughan Basta M.D on 10/19/2017 at 1:23 PM  Between 7am to 6pm - Pager - [phone]  After 6pm go to [email or web address] - Proofreader  [employer]   Office  [phone]

## 2017-10-19 NOTE — Progress Notes (Addendum)
Inpatient Diabetes Program Recommendations  AACE/ADA: New Consensus Statement on Inpatient Glycemic Control (2015)  Target Ranges:  Prepandial:   less than 140 mg/dL      Peak postprandial:   less than 180 mg/dL (1-2 hours)      Critically ill patients:  140 - 180 mg/dL   Results for John Reilly, John Reilly (MRN [id number]) as of 10/19/2017 09:28  Ref. Range 10/18/2017 07:40 10/18/2017 11:07 10/18/2017 16:16 10/18/2017 16:17 10/18/2017 17:05 10/18/2017 17:55 10/18/2017 21:06  Glucose-Capillary Latest Ref Range: 65 - 99 mg/dL 132 (H) 202 (H)  3 units NOVOLOG +  10 units LANTUS 36 (LL) 26 (LL) 31 (LL) 81 186 (H)   Results for John Reilly, John Reilly (MRN [id number]) as of 10/19/2017 09:28  Ref. Range 10/19/2017 07:56  Glucose-Capillary Latest Ref Range: 65 - 99 mg/dL 268 (H)  5 units NOVOLOG    History:Type 1DM(diagnosed 1988), Parkinson's, Dementia, ETOH  Home DM Meds:Insulin Pump  Current Insulin Orders: Lantus 5 units daily      Novolog 0-4 units TID AC per Custom SSI       Note patient with Severe Hypoglycemia yesterday at 4pm.  Question if the Novolog patient received at 12pm caused the Hypoglycemic event?  Spoke with Dr. Anselm Jungling this AM and Dr. Anselm Jungling gave me orders to decrease the Lantus to 5 units daily this AM and to reduce the Novolog SSi to a very Sensitive scale with coverage starting at 201 mg/dl.  Orders placed and reviewed with RN.  Note that PT now recommending SNF for Rehab.  Unsure what discharge plans are at this point?  PT to evaluate pt again today.  Note that Insulin levels and C-peptide levels pending as well.  DO NOT recommend that patient be discharged home on insulin pump.  Recommend Lantus and Novolog injections until patient can be seen by his Endocrinologist Dr. Honor Junes with [facility].     --Will follow patient during hospitalization--  Wyn Quaker RN, MSN, CDE Diabetes Coordinator Inpatient Glycemic Control Team Team Pager: [phone]  (8a-5p)

## 2017-10-19 NOTE — Progress Notes (Addendum)
DO NOT recommend that patient be discharged home on insulin pump.  Recommend Lantus and Novolog injections until patient can be seen by his Endocrinologist Dr. Honor Junes with [facility].  Follow-up Appointment with Dr. Honor Junes has been scheduled for 11/01/2017 at 11:30 am [facility].  Called wife [name] and left voicemail about appointment date and time.   MD- Please give patient the following Insulin Rxs at time of discharge:  1. Lantus Vial- Order # [id number]  6. Humalog Faith Rogue- Order # [id number]  0. Insulin Syringes (0.3 ml)- Order # [id number]      --Will follow patient during hospitalization--  Wyn Quaker RN, MSN, CDE Diabetes Coordinator Inpatient Glycemic Control Team Team Pager: [phone] (8a-5p)

## 2017-10-19 NOTE — Progress Notes (Signed)
Physical Therapy Treatment Patient Details Name: John Reilly MRN: [id number] DOB: August 06, 1955 Today's Date: 10/19/2017    History of Present Illness Pt is a 62 yo male who here last week after a fall, Left proximal humeral fracture (now in sling, NWBing), Left 2nd rib fracutre.  He is now here with DKA (blood glucose was 732 on arrival), altered mental status. PMH: Parkinson's disease (diagnosed 6-9 MA), dementia, COPD, PVD, HTN, DM. In ED BG: 35, Na: 125 (pt reports as chronic), K:2.3. Pt reports estimated 4 falls in past 6 months which he attributes to parkinsonism. PMH: also includes severe Rt ankle fracture 5ya which has since limited mobility. Pt uses a QC at baseline, mostly AMB in the home.     PT Comments    Pt is able to ambulate much better with cane and showed consistent/safe gait with no stagger stepping or choppy cadence like that last few PT sessions.  Pt did well with mobility getting up to EOB and though he still needed a little assist with getting up to standing he overall showed ability to safely go home once medically stable.  Follow Up Recommendations  Home health PT;Supervision for mobility/OOB     Equipment Recommendations       Recommendations for Other Services       Precautions / Restrictions Precautions Precautions: Shoulder Shoulder Interventions: Shoulder sling/immobilizer;At all times Required Braces or Orthoses: Sling Restrictions LUE Weight Bearing: Non weight bearing    Mobility  Bed Mobility Overal bed mobility: Modified Independent Bed Mobility: Supine to Sit;Sit to Supine     Supine to sit: Modified independent (Device/Increase time) Sit to supine: Modified independent (Device/Increase time)   General bed mobility comments: Pt able to easily get to.from supine with only very light use of the rail  Transfers Overall transfer level: Needs assistance Equipment used: Straight cane Transfers: Sit to/from Stand Sit to Stand: Min assist          General transfer comment: Pt struggled to initiate getting hips off bed, PT gave very light assist to initiate movement and then was easily able to rise and maintain balance with cane  Ambulation/Gait Ambulation/Gait assistance: Supervision Ambulation Distance (Feet): 125 Feet Assistive device: Straight cane       General Gait Details: Pt with much improved quality of gait today and was able to safely do multiple in-room loops with confident and consistent cadence, vastly different from previous ambulation attempts   Stairs             Wheelchair Mobility    Modified Rankin (Stroke Patients Only)       Balance Overall balance assessment: Modified Independent Sitting-balance support: Single extremity supported Sitting balance-[name]: Good     Standing balance support: Single extremity supported Standing balance-[name]: Good Standing balance comment: Pt was able to maintain balance confidently and appropraitely with SPC during in-room ambulation with multiple turns/direction changes                            Cognition Arousal/Alertness: Awake/alert Behavior During Therapy: WFL for tasks assessed/performed Overall Cognitive Status: Within Functional Limits for tasks assessed                                        Exercises Total Joint Exercises Hip ABduction/ADduction: Strengthening;10 reps Long Arc Quad: Strengthening;10 reps Knee Flexion: Strengthening;10  reps Marching in Standing: Strengthening;10 reps;Seated    General Comments        Pertinent Vitals/Pain Pain Assessment: (c/o general pain in shoulder, but nothing overly acute)    Home Living                      Prior Function            PT Goals (current goals can now be found in the care plan section) Progress towards PT goals: Progressing toward goals    Frequency    Min 2X/week      PT Plan Discharge plan needs to be updated     Co-evaluation              AM-PAC PT "6 Clicks" Daily Activity  Outcome Measure  Difficulty turning over in bed (including adjusting bedclothes, sheets and blankets)?: A Little Difficulty moving from lying on back to sitting on the side of the bed? : A Little Difficulty sitting down on and standing up from a chair with arms (e.g., wheelchair, bedside commode, etc,.)?: A Little Help needed moving to and from a bed to chair (including a wheelchair)?: None Help needed walking in hospital room?: A Little Help needed climbing 3-5 steps with a railing? : A Little 6 Click Score: 19    End of Session Equipment Utilized During Treatment: Gait belt Activity Tolerance: Patient tolerated treatment well Patient left: with bed alarm set;with call bell/phone within reach Nurse Communication: Mobility status PT Visit Diagnosis: Unsteadiness on feet (R26.81);Repeated falls (R29.6);Difficulty in walking, not elsewhere classified (R26.2);Pain Pain - Right/Left: Left Pain - part of body: Shoulder     Time: 3254-9826 PT Time Calculation (min) (ACUTE ONLY): 28 min  Charges:  $Gait Training: 8-22 mins $Therapeutic Exercise: 8-22 mins                    G Codes:       Kreg Shropshire, DPT 10/19/2017, 3:30 PM

## 2017-10-19 NOTE — Plan of Care (Signed)
  Problem: Clinical Measurements: Goal: Ability to maintain clinical measurements within normal limits will improve Outcome: Progressing   Problem: Activity: Goal: Risk for activity intolerance will decrease Outcome: Progressing   Problem: Pain Managment: Goal: General experience of comfort will improve Outcome: Progressing   

## 2017-10-19 NOTE — Clinical Social Work Note (Signed)
CSW received referral for SNF.  Case discussed with case manager and plan is to discharge home with home health.  CSW to sign off please re-consult if social work needs arise.  Latasha Buczkowski R. Makaiyah Schweiger, MSW, LCSWA [phone]  

## 2017-10-19 NOTE — Progress Notes (Signed)
Pharmacy Electrolyte Monitoring Consult:  Pharmacy consulted to assist in monitoring and replacing electrolytes in this 62 y.o. male admitted on 10/13/2017 with DKA.   Labs:  Sodium (mmol/L)  Date Value  10/19/2017 130 (L)   Potassium (mmol/L)  Date Value  10/19/2017 4.5   Magnesium (mg/dL)  Date Value  10/19/2017 1.8   Phosphorus (mg/dL)  Date Value  10/16/2017 2.5   Calcium (mg/dL)  Date Value  10/19/2017 7.2 (L)   Albumin (g/dL)  Date Value  10/15/2017 1.6 (L)   Corrected calcium= 9.1 mg/dL  Assessment/Plan:  Electrolytes are WNL.  Recheck all labs in 48 hours.  Pharmacy will continue to monitor and adjust per consult.   Napoleon Form, PharmD, BCPS Clinical Pharmacist 10/19/2017 11:28 AM

## 2017-10-20 ENCOUNTER — Encounter: Payer: Self-pay | Admitting: *Deleted

## 2017-10-20 LAB — BASIC METABOLIC PANEL
Anion gap: 7 (ref 5–15)
BUN: 5 mg/dL — AB (ref 6–20)
CALCIUM: 7.4 mg/dL — AB (ref 8.9–10.3)
CO2: 25 mmol/L (ref 22–32)
CREATININE: 0.5 mg/dL — AB (ref 0.61–1.24)
Chloride: 100 mmol/L — ABNORMAL LOW (ref 101–111)
GFR calc non Af Amer: >60 mL/min (ref >60)
Glucose, Bld: 253 mg/dL — ABNORMAL HIGH (ref 65–99)
Potassium: 4.2 mmol/L (ref 3.5–5.1)
SODIUM: 132 mmol/L — AB (ref 135–145)

## 2017-10-20 LAB — CULTURE, BLOOD (ROUTINE X 2)
CULTURE: NO GROWTH
CULTURE: NO GROWTH
SPECIAL REQUESTS: ADEQUATE
Special Requests: ADEQUATE

## 2017-10-20 LAB — INSULIN, RANDOM

## 2017-10-20 LAB — GLUCOSE, CAPILLARY
Glucose-Capillary: 278 mg/dL — ABNORMAL HIGH (ref 65–99)
Glucose-Capillary: 217 mg/dL — ABNORMAL HIGH (ref 65–99)

## 2017-10-20 LAB — C-PEPTIDE

## 2017-10-20 MED ORDER — METOPROLOL SUCCINATE ER 50 MG PO TB24: 50.0000 mg | ORAL_TABLET | Freq: Every day | ORAL | 0 refills | Status: DC

## 2017-10-20 MED ORDER — OXYCODONE-ACETAMINOPHEN 5-325 MG PO TABS: 1.0000 | ORAL_TABLET | ORAL | 0 refills | Status: DC | PRN

## 2017-10-20 MED ORDER — THIAMINE HCL 100 MG PO TABS: 100.0000 mg | ORAL_TABLET | Freq: Every day | ORAL | 0 refills | Status: DC

## 2017-10-20 MED ORDER — INSULIN GLARGINE 100 UNIT/ML SOLOSTAR PEN: 8.0000 [IU] | PEN_INJECTOR | Freq: Every morning | SUBCUTANEOUS | 0 refills | Status: DC

## 2017-10-20 MED ORDER — INSULIN GLARGINE 100 UNIT/ML ~~LOC~~ SOLN: 8.0000 [IU] | Freq: Every day | SUBCUTANEOUS | Status: DC

## 2017-10-20 MED ORDER — CEPHALEXIN 500 MG PO CAPS: 500.0000 mg | ORAL_CAPSULE | Freq: Three times a day (TID) | ORAL | 0 refills | Status: DC

## 2017-10-20 MED ORDER — INSULIN LISPRO 100 UNIT/ML CARTRIDGE: 2.0000 [IU] | Freq: Three times a day (TID) | SUBCUTANEOUS | 11 refills | Status: DC

## 2017-10-20 NOTE — Discharge Summary (Signed)
[facility] [place] [place] [facility] NAME: John Reilly    MR#:  [id number]  DATE OF BIRTH:  Jul 07, 1955  DATE OF ADMISSION:  10/13/2017 ADMITTING PHYSICIAN: Gorden Harms, MD  DATE OF DISCHARGE: 10/20/2017  2:21 PM  PRIMARY CARE PHYSICIAN: Glendon Axe, MD    ADMISSION DIAGNOSIS:  Hypovolemic shock ([place]) [R57.1] Severe sepsis ([place]) [A41.9, R65.20] Diabetic ketoacidosis without coma associated with other specified diabetes mellitus ([place]) [E13.10]  DISCHARGE DIAGNOSIS:  Active Problems:   DKA (diabetic ketoacidoses) ([employer])   SECONDARY DIAGNOSIS:   Past Medical History:  Diagnosis Date  . Cancer ([place])    Pt reports on 10/02/17 that he has never been dx with cancer  . COPD (chronic obstructive pulmonary disease) ([place])   . Diabetes mellitus without complication ([place])   . GERD (gastroesophageal reflux disease)   . Hypertension   . Multiple duodenal ulcers   . Peripheral vascular disease ([facility])    Peripheral Neuropathy  . Retinopathy     HOSPITAL COURSE:   1.  Diabetic ketoacidosis.  The patient was initially brought into the ICU for insulin drip protocol.  The patient states that his insulin pump has been the issue.  He will be be discharged home on Lantus insulin 8 units and small dose short acting insulin prior to meals.  He will follow-up with his endocrinologist. 2.  Klebsiella sepsis.  Patient was treated with IV antibiotics during the hospital course and will be discharged home on a few more days of Keflex As per infectious disease. 3.  Acute encephalopathy this has improved. 4.  Anemia of chronic disease.  Last hemoglobin 9.1.  Continue to monitor. 5.  Acute kidney injury.  This has improved.  Creatinine of 0.5 upon discharge. 6.  History of alcohol abuse. 7.  Hyponatremia secondary to alcohol abuse.  Sodium upon discharge 132. 8.  Hypokalemia on presentation.  This was replaced during the hospital course. 9.  Hypomagnesemia during the  hospital course this has also been replaced. 10.  Recent left shoulder fracture and left rib fracture. 11.  Home health set up.  DISCHARGE CONDITIONS:   [place]  CONSULTS OBTAINED:  Treatment Team:  Leonel Ramsay, MD  DRUG ALLERGIES:   Allergies  Allergen Reactions  . Penicillins Rash    Has patient had a PCN reaction causing immediate rash, facial/tongue/throat swelling, SOB or lightheadedness with hypotension: Yes Has patient had a PCN reaction causing severe rash involving mucus membranes or skin necrosis: No Has patient had a PCN reaction that required hospitalization: No Has patient had a PCN reaction occurring within the last 10 years: No If all of the above answers are "NO", then may proceed with Cephalosporin use.    DISCHARGE MEDICATIONS:   Allergies as of 10/20/2017      Reactions   Penicillins Rash   Has patient had a PCN reaction causing immediate rash, facial/tongue/throat swelling, SOB or lightheadedness with hypotension: Yes Has patient had a PCN reaction causing severe rash involving mucus membranes or skin necrosis: No Has patient had a PCN reaction that required hospitalization: No Has patient had a PCN reaction occurring within the last 10 years: No If all of the above answers are "NO", then may proceed with Cephalosporin use.      Medication List    STOP taking these medications   insulin pump Soln     TAKE these medications   acetaminophen 325 MG tablet Commonly known as:  TYLENOL Take 2 tablets (650  mg total) by mouth every 4 (four) hours as needed for moderate pain (or Fever >/= 101).   amLODipine 5 MG tablet Commonly known as:  NORVASC Take 1 tablet by mouth daily as needed (BP OVER 160/90).   carbidopa-levodopa 25-100 MG tablet Commonly known as:  SINEMET IR Take 2 tablets by mouth 3 (three) times daily.   carbidopa-levodopa 50-200 MG tablet Commonly known as:  SINEMET CR Take 1 tablet by mouth at bedtime.   cephALEXin 500 MG  capsule Commonly known as:  KEFLEX Take 1 capsule (500 mg total) by mouth every 8 (eight) hours.   folic acid 1 MG tablet Commonly known as:  FOLVITE Take 1 mg by mouth daily.   glucagon 1 MG injection 1 mg by Other route once as needed (for severe hypoglycemia).   Insulin Glargine 100 UNIT/ML Solostar Pen Commonly known as:  LANTUS Inject 8 Units into the skin every morning.   insulin lispro 100 UNIT/ML cartridge Commonly known as:  HUMALOG Inject 0.02 mLs (2 Units total) into the skin 3 (three) times daily with meals.   metoprolol succinate 50 MG 24 hr tablet Commonly known as:  TOPROL-XL Take 1 tablet (50 mg total) by mouth daily. Take with or immediately following a meal. What changed:    medication strength  how much to take  additional instructions   multivitamin with minerals Tabs tablet Take 1 tablet by mouth daily.   omeprazole 40 MG capsule Commonly known as:  PRILOSEC Take 40 mg by mouth daily.   oxyCODONE-acetaminophen 5-325 MG tablet Commonly known as:  PERCOCET/ROXICET Take 1 tablet by mouth every 4 (four) hours as needed for moderate pain or severe pain.   SUPER B-COMPLEX Tabs Take 1 tablet by mouth daily.   thiamine 100 MG tablet Take 1 tablet (100 mg total) by mouth daily.        DISCHARGE INSTRUCTIONS:   Follow-up PMD 6 days Patient states that he he will call the insulin pump people and figure out what is going on with the insulin pump. He will follow-up with his endocrinologist as scheduled.  If you experience worsening of your admission symptoms, develop shortness of breath, life threatening emergency, suicidal or homicidal thoughts you must seek medical attention immediately by calling 911 or calling your MD immediately  if symptoms less severe.  You Must read complete instructions/literature along with all the possible adverse reactions/side effects for all the Medicines you take and that have been prescribed to you. Take any new  Medicines after you have completely understood and accept all the possible adverse reactions/side effects.   Please note  You were cared for by a hospitalist during your hospital stay. If you have any questions about your discharge medications or the care you received while you were in the hospital after you are discharged, you can call the unit and asked to speak with the hospitalist on call if the hospitalist that took care of you is not available. Once you are discharged, your primary care physician will handle any further medical issues. Please note that NO REFILLS for any discharge medications will be authorized once you are discharged, as it is imperative that you return to your primary care physician (or establish a relationship with a primary care physician if you do not have one) for your aftercare needs so that they can reassess your need for medications and monitor your lab values.    Today   CHIEF COMPLAINT:   Chief Complaint  Patient presents with  .  Altered Mental Status    HISTORY OF PRESENT ILLNESS:  Rashed Edler  is a 62 y.o. male  came in with altered mental status   VITAL SIGNS:  Blood pressure 129/69, pulse 86, temperature 98.1 F (36.7 C), temperature source Oral, resp. rate 18, height 5\' 10"  (1.778 m), weight 78.7 kg (173 lb 8 oz), SpO2 100 %.   PHYSICAL EXAMINATION:  GENERAL:  62 y.o.-year-old patient lying in the bed with no acute distress.  EYES: Pupils equal, round, reactive to light and accommodation. No scleral icterus. Extraocular muscles intact.  HEENT: Head atraumatic, normocephalic. Oropharynx and nasopharynx clear.  NECK:  Supple, no jugular venous distention. No thyroid enlargement, no tenderness.  LUNGS: Normal breath sounds bilaterally, no wheezing, rales,rhonchi or crepitation. No use of accessory muscles of respiration.  CARDIOVASCULAR: S1, S2 normal.  2 out of 6 systolic murmur.  No rubs, or gallops.  ABDOMEN: Soft, non-tender, non-distended.  Bowel sounds present. No organomegaly or mass.  EXTREMITIES: Trace pedal edema, cyanosis, or clubbing.  NEUROLOGIC: Cranial nerves II through XII are intact. Muscle strength 5/5 in all extremities. Sensation intact. Gait not checked.  PSYCHIATRIC: The patient is alert and oriented x 3.  SKIN: No obvious rash, lesion, or ulcer.   DATA REVIEW:   CBC Recent Labs  Lab 10/19/17 0504  WBC 4.5  HGB 9.1*  HCT 28.1*  PLT 374    Chemistries  Recent Labs  Lab 10/13/17 1729  10/19/17 0713 10/20/17 0446  NA 133*   < > 130* 132*  K 4.1   < > 4.5 4.2  CL 96*   < > 99* 100*  CO2 <7*   < > 23 25  GLUCOSE 765*   < > 268* 253*  BUN 12   < > 5* 5*  CREATININE 1.58*   < > 0.54* 0.50*  CALCIUM 7.8*   < > 7.2* 7.4*  MG  --    < > 1.8  --   AST 32  --   --   --   ALT <5*  --   --   --   ALKPHOS 149*  --   --   --   BILITOT 1.9*  --   --   --    < > = values in this interval not displayed.    Cardiac Enzymes Recent Labs  Lab 10/14/17 0244  TROPONINI 0.20*    Microbiology Results  Results for orders placed or performed during the hospital encounter of 10/13/17  Urine Culture     Status: Abnormal   Collection Time: 10/13/17  5:24 PM  Result Value Ref Range Status   Specimen Description   Final    URINE, RANDOM Performed at [facility], [address], [place], [place] [address]    Special Requests   Final    NONE Performed at [facility], [place], [place] [address]    Culture 80,000 COLONIES/mL KLEBSIELLA PNEUMONIAE (A)  Final   Report Status 10/16/2017 FINAL  Final   Organism ID, Bacteria KLEBSIELLA PNEUMONIAE (A)  Final      Susceptibility   Klebsiella pneumoniae - MIC*    AMPICILLIN >=32 RESISTANT Resistant     CEFAZOLIN <=4 SENSITIVE Sensitive     CEFTRIAXONE <=1 SENSITIVE Sensitive     CIPROFLOXACIN <=0.25 SENSITIVE Sensitive     GENTAMICIN <=1 SENSITIVE Sensitive     IMIPENEM <=0.25 SENSITIVE Sensitive     NITROFURANTOIN 32  SENSITIVE Sensitive  TRIMETH/SULFA <=20 SENSITIVE Sensitive     AMPICILLIN/SULBACTAM 8 SENSITIVE Sensitive     PIP/TAZO <=4 SENSITIVE Sensitive     Extended ESBL NEGATIVE Sensitive     * 80,000 COLONIES/mL KLEBSIELLA PNEUMONIAE  Blood Culture (routine x 2)     Status: Abnormal   Collection Time: 10/13/17  5:29 PM  Result Value Ref Range Status   Specimen Description   Final    BLOOD RIGHT ANTECUBITAL Performed at [facility], [address] [place], [place] [address]    Special Requests   Final    BOTTLES DRAWN AEROBIC AND ANAEROBIC Blood Culture adequate volume Performed at [facility], [address] [place], [place] [address]    Culture  Setup Time   Final    GRAM NEGATIVE RODS ANAEROBIC BOTTLE ONLY CRITICAL RESULT CALLED TO, READ BACK BY AND VERIFIED WITH: HANK ZOMPA ON 10/15/17 AT 21 QSD Performed at [facility], [place] [address] [place], [place] [address]    Culture KLEBSIELLA PNEUMONIAE (A)  Final   Report Status 10/17/2017 FINAL  Final   Organism ID, Bacteria KLEBSIELLA PNEUMONIAE  Final      Susceptibility   Klebsiella pneumoniae - MIC*    AMPICILLIN >=32 RESISTANT Resistant     CEFAZOLIN <=4 SENSITIVE Sensitive     CEFEPIME <=1 SENSITIVE Sensitive     CEFTAZIDIME <=1 SENSITIVE Sensitive     CEFTRIAXONE <=1 SENSITIVE Sensitive     CIPROFLOXACIN <=0.25 SENSITIVE Sensitive     GENTAMICIN <=1 SENSITIVE Sensitive     IMIPENEM <=0.25 SENSITIVE Sensitive     TRIMETH/SULFA <=20 SENSITIVE Sensitive     AMPICILLIN/SULBACTAM 4 SENSITIVE Sensitive     PIP/TAZO <=4 SENSITIVE Sensitive     Extended ESBL NEGATIVE Sensitive     * KLEBSIELLA PNEUMONIAE  Blood Culture ID Panel (Reflexed)     Status: Abnormal   Collection Time: 10/13/17  5:29 PM  Result Value Ref Range Status   Enterococcus species NOT DETECTED NOT DETECTED Final   Listeria monocytogenes NOT DETECTED NOT DETECTED Final   Staphylococcus species NOT DETECTED NOT DETECTED Final    Staphylococcus aureus NOT DETECTED NOT DETECTED Final   Streptococcus species NOT DETECTED NOT DETECTED Final   Streptococcus agalactiae NOT DETECTED NOT DETECTED Final   Streptococcus pneumoniae NOT DETECTED NOT DETECTED Final   Streptococcus pyogenes NOT DETECTED NOT DETECTED Final   Acinetobacter baumannii NOT DETECTED NOT DETECTED Final   Enterobacteriaceae species DETECTED (A) NOT DETECTED Final    Comment: Enterobacteriaceae represent a large family of gram-negative bacteria, not a single organism. CRITICAL RESULT CALLED TO, READ BACK BY AND VERIFIED WITH: HANK ZOMPA ON 10/15/17 AT 1358 QSD    Enterobacter cloacae complex NOT DETECTED NOT DETECTED Final   Escherichia coli NOT DETECTED NOT DETECTED Final   Klebsiella oxytoca NOT DETECTED NOT DETECTED Final   Klebsiella pneumoniae DETECTED (A) NOT DETECTED Final    Comment: CRITICAL RESULT CALLED TO, READ BACK BY AND VERIFIED WITH: HANK ZOMPA ON 10/15/17 AT 1358 QSD    Proteus species NOT DETECTED NOT DETECTED Final   Serratia marcescens NOT DETECTED NOT DETECTED Final   Carbapenem resistance NOT DETECTED NOT DETECTED Final   Haemophilus influenzae NOT DETECTED NOT DETECTED Final   Neisseria meningitidis NOT DETECTED NOT DETECTED Final   Pseudomonas aeruginosa NOT DETECTED NOT DETECTED Final   Candida albicans NOT DETECTED NOT DETECTED Final   Candida glabrata NOT DETECTED NOT DETECTED Final   Candida krusei NOT DETECTED NOT DETECTED Final  Candida parapsilosis NOT DETECTED NOT DETECTED Final   Candida tropicalis NOT DETECTED NOT DETECTED Final    Comment: Performed at [facility], [address] [place], [place] [address]  Blood Culture (routine x 2)     Status: None   Collection Time: 10/13/17  5:34 PM  Result Value Ref Range Status   Specimen Description BLOOD BLOOD RIGHT FOREARM  Final   Special Requests   Final    BOTTLES DRAWN AEROBIC AND ANAEROBIC Blood Culture adequate volume   Culture   Final    NO GROWTH 5  DAYS Performed at [facility], [address] [place], [place] [address]    Report Status 10/18/2017 FINAL  Final  MRSA PCR Screening     Status: Abnormal   Collection Time: 10/13/17  8:00 PM  Result Value Ref Range Status   MRSA by PCR POSITIVE (A) NEGATIVE Final    Comment:        The GeneXpert MRSA Assay (FDA approved for NASAL specimens only), is one component of a comprehensive MRSA colonization surveillance program. It is not intended to diagnose MRSA infection nor to guide or monitor treatment for MRSA infections. RESULT CALLED TO, READ BACK BY AND VERIFIED WITH: DELL HOPKINS ON 10/13/17 AT 2228 JAG Performed at [facility], [address] [place], [place] [address]   CULTURE, BLOOD (ROUTINE X 2) w Reflex to ID Panel     Status: None   Collection Time: 10/15/17  3:39 PM  Result Value Ref Range Status   Specimen Description BLOOD RIGHT ANTECUBITAL  Final   Special Requests   Final    BOTTLES DRAWN AEROBIC AND ANAEROBIC Blood Culture adequate volume   Culture   Final    NO GROWTH 5 DAYS Performed at [facility], [address] [place], [place] [address]    Report Status 10/20/2017 FINAL  Final  CULTURE, BLOOD (ROUTINE X 2) w Reflex to ID Panel     Status: None   Collection Time: 10/15/17  3:39 PM  Result Value Ref Range Status   Specimen Description BLOOD BLOOD RIGHT HAND  Final   Special Requests   Final    BOTTLES DRAWN AEROBIC AND ANAEROBIC Blood Culture adequate volume   Culture   Final    NO GROWTH 5 DAYS Performed at [facility], [address], [place], [place] [address]    Report Status 10/20/2017 FINAL  Final     Management plans discussed with the patient, family and they are in agreement.  CODE STATUS:     Code Status Orders  (From admission, onward)        Start     Ordered   10/13/17 1956  Full code  Continuous     10/13/17 1955    Code Status History    Date Active Date Inactive Code Status  Order ID Comments User Context   10/03/2017 0209 10/04/2017 1452 Full Code [id number]  Amelia Jo, MD Inpatient   03/19/2015 1939 03/21/2015 1928 Full Code [id number]  Bettey Costa, MD Inpatient   03/15/2015 2114 03/16/2015 1837 Full Code [id number]  Hower, Aaron Mose, MD ED      TOTAL TIME TAKING CARE OF THIS PATIENT: 35 minutes.    Loletha Grayer M.D on 10/20/2017 at 4:05 PM  Between 7am to 6pm - Pager - [phone]  After 6pm go to [email or web address] - password [email or web address] [facility]  [phone]  CC: Primary care physician; Glendon Axe, MD

## 2017-10-20 NOTE — Progress Notes (Signed)
Pt to be discharged this afternoon. Iv  Removed. disch instructions given to pt and wife to their understanding. Discharged via w.c. Wife to transport to home

## 2017-10-20 NOTE — Progress Notes (Signed)
Inpatient Diabetes Program Recommendations  AACE/ADA: New Consensus Statement on Inpatient Glycemic Control (2019)  Target Ranges:  Prepandial:   less than 140 mg/dL      Peak postprandial:   less than 180 mg/dL (1-2 hours)      Critically ill patients:  140 - 180 mg/dL   Results for John Reilly, John Reilly (MRN [id number]) as of 10/20/2017 08:39  Ref. Range 10/19/2017 07:56 10/19/2017 11:45 10/19/2017 17:01 10/19/2017 20:44 10/20/2017 08:06  Glucose-Capillary Latest Ref Range: 65 - 99 mg/dL 268 (H) 151 (H) 99 147 (H) 278 (H)   Review of Glycemic Control  Diabetes history: DM1 (dx 1988) Outpatient Diabetes medications: Insulin Pump Current orders for Inpatient glycemic control: Lantus 5 units daily, Novolog 0-4 units TID with meals (custom correction scale)  Inpatient Diabetes Program Recommendations: Insulin - Basal: Please consider increasing Lantus to 8 units daily. Correction (SSI): Continue custom correction scale as ordered. Insulin - Meal Coverage: Please consider ordering Novolog 2 unit TID with meals for meal coverage if patient eats at least 50% of meals.  DO NOT recommend that patient be discharged home on insulin pump. Recommend Lantus and Humalog injections until patient can be seen by his Endocrinologist Dr. Honor Junes with [facility].  Follow-up Appointment with Dr. Honor Junes has been scheduled for 11/01/2017 at 11:30 am [facility].   MD- Please give patient the following Insulin Rxs at time of discharge:  1. Lantus Vial- Order # [id number]  3. Humalog Faith Rogue- Order # [id number]  6. Insulin Syringes (0.3 ml)- Order # [id number]  Thanks, Barnie Alderman, RN, MSN, CDE Diabetes Coordinator Inpatient Diabetes Program [phone] (Team Pager from 8am to 5pm)

## 2017-10-23 LAB — BLOOD GAS, VENOUS
ACID-BASE DEFICIT: 26.7 mmol/L — AB (ref 0.0–2.0)
BICARBONATE: 4.6 mmol/L — AB (ref 20.0–28.0)
O2 SAT: UNDETERMINED %
PATIENT TEMPERATURE: 37
pCO2, Ven: 23 mmHg — ABNORMAL LOW (ref 44.0–60.0)
pH, Ven: 6.91 — CL (ref 7.250–7.430)

## 2018-08-07 ENCOUNTER — Other Ambulatory Visit: Payer: Self-pay

## 2018-08-07 ENCOUNTER — Inpatient Hospital Stay
Admission: EM | Admit: 2018-08-07 | Discharge: 2018-08-10 | DRG: 853 | Disposition: A | Discharge status: Other Acute Inpatient Hospital | Payer: Medicare Other | Attending: Internal Medicine | Admitting: Internal Medicine

## 2018-08-07 ENCOUNTER — Emergency Department: Payer: Medicare Other

## 2018-08-07 ENCOUNTER — Encounter: Payer: Self-pay | Admitting: Emergency Medicine

## 2018-08-07 DIAGNOSIS — A419 Sepsis, unspecified organism: Secondary | ICD-10-CM

## 2018-08-07 DIAGNOSIS — R05 Cough: Secondary | ICD-10-CM | POA: Diagnosis not present

## 2018-08-07 DIAGNOSIS — Z794 Long term (current) use of insulin: Secondary | ICD-10-CM

## 2018-08-07 DIAGNOSIS — E1142 Type 2 diabetes mellitus with diabetic polyneuropathy: Secondary | ICD-10-CM | POA: Diagnosis present

## 2018-08-07 DIAGNOSIS — D649 Anemia, unspecified: Secondary | ICD-10-CM | POA: Diagnosis present

## 2018-08-07 DIAGNOSIS — A4189 Other specified sepsis: Secondary | ICD-10-CM | POA: Diagnosis present

## 2018-08-07 DIAGNOSIS — B961 Klebsiella pneumoniae [K. pneumoniae] as the cause of diseases classified elsewhere: Secondary | ICD-10-CM | POA: Diagnosis present

## 2018-08-07 DIAGNOSIS — J449 Chronic obstructive pulmonary disease, unspecified: Secondary | ICD-10-CM | POA: Diagnosis present

## 2018-08-07 DIAGNOSIS — Z7289 Other problems related to lifestyle: Secondary | ICD-10-CM | POA: Diagnosis not present

## 2018-08-07 DIAGNOSIS — N390 Urinary tract infection, site not specified: Secondary | ICD-10-CM | POA: Diagnosis present

## 2018-08-07 DIAGNOSIS — N170 Acute kidney failure with tubular necrosis: Secondary | ICD-10-CM | POA: Diagnosis present

## 2018-08-07 DIAGNOSIS — F1020 Alcohol dependence, uncomplicated: Secondary | ICD-10-CM | POA: Diagnosis present

## 2018-08-07 DIAGNOSIS — Z8711 Personal history of peptic ulcer disease: Secondary | ICD-10-CM

## 2018-08-07 DIAGNOSIS — G9341 Metabolic encephalopathy: Secondary | ICD-10-CM | POA: Diagnosis present

## 2018-08-07 DIAGNOSIS — T85694A Other mechanical complication of insulin pump, initial encounter: Secondary | ICD-10-CM | POA: Diagnosis present

## 2018-08-07 DIAGNOSIS — E1310 Other specified diabetes mellitus with ketoacidosis without coma: Secondary | ICD-10-CM | POA: Diagnosis present

## 2018-08-07 DIAGNOSIS — Z823 Family history of stroke: Secondary | ICD-10-CM

## 2018-08-07 DIAGNOSIS — J80 Acute respiratory distress syndrome: Secondary | ICD-10-CM | POA: Diagnosis not present

## 2018-08-07 DIAGNOSIS — E871 Hypo-osmolality and hyponatremia: Secondary | ICD-10-CM | POA: Diagnosis not present

## 2018-08-07 DIAGNOSIS — S72002A Fracture of unspecified part of neck of left femur, initial encounter for closed fracture: Secondary | ICD-10-CM

## 2018-08-07 DIAGNOSIS — E11319 Type 2 diabetes mellitus with unspecified diabetic retinopathy without macular edema: Secondary | ICD-10-CM | POA: Diagnosis present

## 2018-08-07 DIAGNOSIS — Z820 Family history of epilepsy and other diseases of the nervous system: Secondary | ICD-10-CM

## 2018-08-07 DIAGNOSIS — Z880 Allergy status to penicillin: Secondary | ICD-10-CM

## 2018-08-07 DIAGNOSIS — W19XXXA Unspecified fall, initial encounter: Secondary | ICD-10-CM | POA: Diagnosis present

## 2018-08-07 DIAGNOSIS — K219 Gastro-esophageal reflux disease without esophagitis: Secondary | ICD-10-CM | POA: Diagnosis present

## 2018-08-07 DIAGNOSIS — I10 Essential (primary) hypertension: Secondary | ICD-10-CM | POA: Diagnosis present

## 2018-08-07 DIAGNOSIS — T796XXA Traumatic ischemia of muscle, initial encounter: Secondary | ICD-10-CM | POA: Diagnosis present

## 2018-08-07 DIAGNOSIS — S72142A Displaced intertrochanteric fracture of left femur, initial encounter for closed fracture: Secondary | ICD-10-CM | POA: Diagnosis present

## 2018-08-07 DIAGNOSIS — R739 Hyperglycemia, unspecified: Secondary | ICD-10-CM | POA: Diagnosis not present

## 2018-08-07 DIAGNOSIS — R4182 Altered mental status, unspecified: Secondary | ICD-10-CM

## 2018-08-07 DIAGNOSIS — G20 Parkinson's disease: Secondary | ICD-10-CM | POA: Diagnosis not present

## 2018-08-07 DIAGNOSIS — Y92009 Unspecified place in unspecified non-institutional (private) residence as the place of occurrence of the external cause: Secondary | ICD-10-CM | POA: Diagnosis not present

## 2018-08-07 DIAGNOSIS — E11649 Type 2 diabetes mellitus with hypoglycemia without coma: Secondary | ICD-10-CM | POA: Diagnosis present

## 2018-08-07 DIAGNOSIS — R509 Fever, unspecified: Secondary | ICD-10-CM | POA: Diagnosis not present

## 2018-08-07 DIAGNOSIS — J1289 Other viral pneumonia: Secondary | ICD-10-CM | POA: Diagnosis present

## 2018-08-07 DIAGNOSIS — T148XXA Other injury of unspecified body region, initial encounter: Secondary | ICD-10-CM

## 2018-08-07 DIAGNOSIS — Z833 Family history of diabetes mellitus: Secondary | ICD-10-CM

## 2018-08-07 DIAGNOSIS — Z79899 Other long term (current) drug therapy: Secondary | ICD-10-CM

## 2018-08-07 DIAGNOSIS — Z960 Presence of urogenital implants: Secondary | ICD-10-CM | POA: Diagnosis not present

## 2018-08-07 DIAGNOSIS — E1110 Type 2 diabetes mellitus with ketoacidosis without coma: Secondary | ICD-10-CM | POA: Diagnosis present

## 2018-08-07 DIAGNOSIS — Z87891 Personal history of nicotine dependence: Secondary | ICD-10-CM

## 2018-08-07 LAB — COMPREHENSIVE METABOLIC PANEL
ALT: 16 U/L (ref 0–44)
AST: 56 U/L — ABNORMAL HIGH (ref 15–41)
Albumin: 3.6 g/dL (ref 3.5–5.0)
Alkaline Phosphatase: 112 U/L (ref 38–126)
Anion gap: 25 — ABNORMAL HIGH (ref 5–15)
BUN: 22 mg/dL (ref 8–23)
CO2: 14 mmol/L — ABNORMAL LOW (ref 22–32)
Calcium: 8.6 mg/dL — ABNORMAL LOW (ref 8.9–10.3)
Chloride: 83 mmol/L — ABNORMAL LOW (ref 98–111)
Creatinine, Ser: 1.58 mg/dL — ABNORMAL HIGH (ref 0.61–1.24)
GFR calc Af Amer: 53 mL/min — ABNORMAL LOW (ref >60)
GFR calc non Af Amer: 46 mL/min — ABNORMAL LOW (ref >60)
Glucose, Bld: 507 mg/dL (ref 70–99)
POTASSIUM: 5.2 mmol/L — AB (ref 3.5–5.1)
Sodium: 122 mmol/L — ABNORMAL LOW (ref 135–145)
TOTAL PROTEIN: 7.1 g/dL (ref 6.5–8.1)
Total Bilirubin: 1.7 mg/dL — ABNORMAL HIGH (ref 0.3–1.2)

## 2018-08-07 LAB — CBC WITH DIFFERENTIAL/PLATELET
Abs Immature Granulocytes: 0.13 10*3/uL — ABNORMAL HIGH (ref 0.00–0.07)
Basophils Absolute: 0 10*3/uL (ref 0.0–0.1)
Basophils Relative: 0 %
Eosinophils Absolute: 0 10*3/uL (ref 0.0–0.5)
Eosinophils Relative: 0 %
HCT: 32.9 % — ABNORMAL LOW (ref 39.0–52.0)
Hemoglobin: 10.9 g/dL — ABNORMAL LOW (ref 13.0–17.0)
Immature Granulocytes: 1 %
Lymphocytes Relative: 5 %
Lymphs Abs: 0.7 10*3/uL (ref 0.7–4.0)
MCH: 31.6 pg (ref 26.0–34.0)
MCHC: 33.1 g/dL (ref 30.0–36.0)
MCV: 95.4 fL (ref 80.0–100.0)
Monocytes Absolute: 0.5 10*3/uL (ref 0.1–1.0)
Monocytes Relative: 4 %
NEUTROS ABS: 13.9 10*3/uL — AB (ref 1.7–7.7)
Neutrophils Relative %: 90 %
Platelets: 423 10*3/uL — ABNORMAL HIGH (ref 150–400)
RBC: 3.45 MIL/uL — ABNORMAL LOW (ref 4.22–5.81)
RDW: 12.3 % (ref 11.5–15.5)
WBC: 15.3 10*3/uL — ABNORMAL HIGH (ref 4.0–10.5)
nRBC: 0 % (ref 0.0–0.2)

## 2018-08-07 LAB — MRSA PCR SCREENING: MRSA by PCR: POSITIVE — AB

## 2018-08-07 LAB — URINALYSIS, ROUTINE W REFLEX MICROSCOPIC
Bilirubin Urine: NEGATIVE
Glucose, UA: >=500 mg/dL — AB
Ketones, ur: 80 mg/dL — AB
Leukocytes,Ua: NEGATIVE
Nitrite: NEGATIVE
Protein, ur: 100 mg/dL — AB
Specific Gravity, Urine: 1.017 (ref 1.005–1.030)
Squamous Epithelial / HPF: NONE SEEN (ref 0–5)
pH: 5 (ref 5.0–8.0)

## 2018-08-07 LAB — BASIC METABOLIC PANEL
Anion gap: 17 — ABNORMAL HIGH (ref 5–15)
Anion gap: 12 (ref 5–15)
BUN: 25 mg/dL — ABNORMAL HIGH (ref 8–23)
BUN: 26 mg/dL — ABNORMAL HIGH (ref 8–23)
CHLORIDE: 91 mmol/L — AB (ref 98–111)
CHLORIDE: 94 mmol/L — AB (ref 98–111)
CO2: 16 mmol/L — ABNORMAL LOW (ref 22–32)
CO2: 20 mmol/L — AB (ref 22–32)
Calcium: 7.9 mg/dL — ABNORMAL LOW (ref 8.9–10.3)
Calcium: 7.7 mg/dL — ABNORMAL LOW (ref 8.9–10.3)
Creatinine, Ser: 1.77 mg/dL — ABNORMAL HIGH (ref 0.61–1.24)
Creatinine, Ser: 1.52 mg/dL — ABNORMAL HIGH (ref 0.61–1.24)
GFR calc Af Amer: 46 mL/min — ABNORMAL LOW (ref >60)
GFR calc Af Amer: 56 mL/min — ABNORMAL LOW (ref >60)
GFR calc non Af Amer: 40 mL/min — ABNORMAL LOW (ref >60)
GFR calc non Af Amer: 48 mL/min — ABNORMAL LOW (ref >60)
Glucose, Bld: 340 mg/dL — ABNORMAL HIGH (ref 70–99)
Glucose, Bld: 179 mg/dL — ABNORMAL HIGH (ref 70–99)
POTASSIUM: 4.2 mmol/L (ref 3.5–5.1)
Potassium: 3.9 mmol/L (ref 3.5–5.1)
Sodium: 124 mmol/L — ABNORMAL LOW (ref 135–145)
Sodium: 126 mmol/L — ABNORMAL LOW (ref 135–145)

## 2018-08-07 LAB — GLUCOSE, CAPILLARY
GLUCOSE-CAPILLARY: 452 mg/dL — AB (ref 70–99)
GLUCOSE-CAPILLARY: 435 mg/dL — AB (ref 70–99)
GLUCOSE-CAPILLARY: 282 mg/dL — AB (ref 70–99)
Glucose-Capillary: 148 mg/dL — ABNORMAL HIGH (ref 70–99)
Glucose-Capillary: 150 mg/dL — ABNORMAL HIGH (ref 70–99)
Glucose-Capillary: 227 mg/dL — ABNORMAL HIGH (ref 70–99)
Glucose-Capillary: 497 mg/dL — ABNORMAL HIGH (ref 70–99)

## 2018-08-07 LAB — ETHANOL

## 2018-08-07 LAB — LACTIC ACID, PLASMA
Lactic Acid, Venous: 3.1 mmol/L (ref 0.5–1.9)
Lactic Acid, Venous: 1.8 mmol/L (ref 0.5–1.9)
Lactic Acid, Venous: 1.9 mmol/L (ref 0.5–1.9)

## 2018-08-07 LAB — CK: Total CK: 1647 U/L — ABNORMAL HIGH (ref 49–397)

## 2018-08-07 MED ORDER — CARBIDOPA-LEVODOPA ER 50-200 MG PO TBCR
1.0000 | EXTENDED_RELEASE_TABLET | Freq: Every day | ORAL | Status: DC
  Administered 2018-08-07 – 2018-08-09 (×3): 1 via ORAL
  Filled 2018-08-07 (×4): qty 1

## 2018-08-07 MED ORDER — CLINDAMYCIN PHOSPHATE 600 MG/50ML IV SOLN
600.0000 mg | Freq: Once | INTRAVENOUS | Status: AC
  Administered 2018-08-08: 600 mg via INTRAVENOUS
  Filled 2018-08-07: qty 50

## 2018-08-07 MED ORDER — VANCOMYCIN HCL 10 G IV SOLR
1250.0000 mg | INTRAVENOUS | Status: DC
  Filled 2018-08-07: qty 1250

## 2018-08-07 MED ORDER — VITAMIN B-1 100 MG PO TABS
100.0000 mg | ORAL_TABLET | Freq: Every day | ORAL | Status: DC
  Administered 2018-08-07 – 2018-08-10 (×4): 100 mg via ORAL
  Filled 2018-08-07 (×4): qty 1

## 2018-08-07 MED ORDER — METOPROLOL SUCCINATE ER 50 MG PO TB24
50.0000 mg | ORAL_TABLET | Freq: Every day | ORAL | Status: DC
  Administered 2018-08-07 – 2018-08-10 (×4): 50 mg via ORAL
  Filled 2018-08-07 (×4): qty 1

## 2018-08-07 MED ORDER — SODIUM CHLORIDE 0.9 % IV SOLN
2.0000 g | Freq: Two times a day (BID) | INTRAVENOUS | Status: DC
  Administered 2018-08-07 – 2018-08-08 (×2): 2 g via INTRAVENOUS
  Filled 2018-08-07 (×4): qty 2

## 2018-08-07 MED ORDER — ADULT MULTIVITAMIN W/MINERALS CH
1.0000 | ORAL_TABLET | Freq: Every day | ORAL | Status: DC
  Administered 2018-08-07 – 2018-08-10 (×4): 1 via ORAL
  Filled 2018-08-07 (×4): qty 1

## 2018-08-07 MED ORDER — DEXTROSE-NACL 5-0.45 % IV SOLN
INTRAVENOUS | Status: DC
  Administered 2018-08-07: 22:00:00 via INTRAVENOUS

## 2018-08-07 MED ORDER — HYDRALAZINE HCL 20 MG/ML IJ SOLN: 5.0000 mg | INTRAMUSCULAR | Status: DC | PRN

## 2018-08-07 MED ORDER — INSULIN REGULAR(HUMAN) IN NACL 100-0.9 UT/100ML-% IV SOLN: INTRAVENOUS | Status: DC

## 2018-08-07 MED ORDER — SODIUM CHLORIDE 0.9 % IV SOLN
2.0000 g | Freq: Once | INTRAVENOUS | Status: AC
  Administered 2018-08-07: 2 g via INTRAVENOUS
  Filled 2018-08-07: qty 2

## 2018-08-07 MED ORDER — SODIUM CHLORIDE 0.9 % IV SOLN: INTRAVENOUS | Status: DC

## 2018-08-07 MED ORDER — CARBIDOPA-LEVODOPA 25-250 MG PO TABS
1.0000 | ORAL_TABLET | Freq: Three times a day (TID) | ORAL | Status: DC
  Administered 2018-08-07 – 2018-08-10 (×7): 1 via ORAL
  Filled 2018-08-07 (×11): qty 1

## 2018-08-07 MED ORDER — METRONIDAZOLE IN NACL 5-0.79 MG/ML-% IV SOLN
500.0000 mg | Freq: Once | INTRAVENOUS | Status: AC
  Administered 2018-08-07: 500 mg via INTRAVENOUS
  Filled 2018-08-07: qty 100

## 2018-08-07 MED ORDER — ACETAMINOPHEN 325 MG PO TABS
650.0000 mg | ORAL_TABLET | Freq: Once | ORAL | Status: AC
  Administered 2018-08-07: 650 mg via ORAL
  Filled 2018-08-07: qty 2

## 2018-08-07 MED ORDER — AMLODIPINE BESYLATE 5 MG PO TABS
5.0000 mg | ORAL_TABLET | Freq: Every day | ORAL | Status: DC | PRN
  Administered 2018-08-10: 5 mg via ORAL
  Filled 2018-08-07: qty 1

## 2018-08-07 MED ORDER — B COMPLEX-C PO TABS
1.0000 | ORAL_TABLET | Freq: Every day | ORAL | Status: DC
  Administered 2018-08-08 – 2018-08-10 (×3): 1 via ORAL
  Filled 2018-08-07 (×3): qty 1

## 2018-08-07 MED ORDER — MORPHINE SULFATE (PF) 2 MG/ML IV SOLN
2.0000 mg | Freq: Once | INTRAVENOUS | Status: AC
  Administered 2018-08-07: 2 mg via INTRAVENOUS
  Filled 2018-08-07: qty 1

## 2018-08-07 MED ORDER — SODIUM CHLORIDE 0.9 % IV SOLN
INTRAVENOUS | Status: AC
  Administered 2018-08-07: 20:00:00 via INTRAVENOUS

## 2018-08-07 MED ORDER — INSULIN ASPART 100 UNIT/ML ~~LOC~~ SOLN
0.0000 [IU] | SUBCUTANEOUS | Status: DC
  Administered 2018-08-08: 4 [IU] via SUBCUTANEOUS
  Administered 2018-08-08: 2 [IU] via SUBCUTANEOUS
  Administered 2018-08-08: 4 [IU] via SUBCUTANEOUS
  Administered 2018-08-08: 2 [IU] via SUBCUTANEOUS
  Administered 2018-08-08: 8 [IU] via SUBCUTANEOUS
  Administered 2018-08-09: 4 [IU] via SUBCUTANEOUS
  Filled 2018-08-07 (×7): qty 1

## 2018-08-07 MED ORDER — VANCOMYCIN HCL IN DEXTROSE 1-5 GM/200ML-% IV SOLN
1000.0000 mg | Freq: Once | INTRAVENOUS | Status: DC
  Filled 2018-08-07: qty 200

## 2018-08-07 MED ORDER — INSULIN REGULAR(HUMAN) IN NACL 100-0.9 UT/100ML-% IV SOLN
INTRAVENOUS | Status: DC
  Administered 2018-08-07: 4.4 [IU]/h via INTRAVENOUS
  Filled 2018-08-07: qty 100

## 2018-08-07 MED ORDER — VANCOMYCIN HCL 10 G IV SOLR
1500.0000 mg | Freq: Once | INTRAVENOUS | Status: AC
  Administered 2018-08-07: 1500 mg via INTRAVENOUS
  Filled 2018-08-07: qty 1500

## 2018-08-07 MED ORDER — INSULIN DETEMIR 100 UNIT/ML ~~LOC~~ SOLN
10.0000 [IU] | Freq: Every day | SUBCUTANEOUS | Status: DC
  Administered 2018-08-08 – 2018-08-09 (×3): 10 [IU] via SUBCUTANEOUS
  Filled 2018-08-07 (×5): qty 0.1

## 2018-08-07 MED ORDER — PANTOPRAZOLE SODIUM 40 MG PO TBEC
80.0000 mg | DELAYED_RELEASE_TABLET | Freq: Every day | ORAL | Status: DC
  Administered 2018-08-07 – 2018-08-10 (×4): 80 mg via ORAL
  Filled 2018-08-07 (×4): qty 2

## 2018-08-07 MED ORDER — SODIUM CHLORIDE 0.9 % IV BOLUS
1000.0000 mL | Freq: Once | INTRAVENOUS | Status: AC
  Administered 2018-08-07: 1000 mL via INTRAVENOUS

## 2018-08-07 MED ORDER — OXYCODONE-ACETAMINOPHEN 5-325 MG PO TABS: 1.0000 | ORAL_TABLET | ORAL | Status: DC | PRN

## 2018-08-07 NOTE — Progress Notes (Signed)
CODE SEPSIS - PHARMACY COMMUNICATION  **Broad Spectrum Antibiotics should be administered within 1 hour of Sepsis diagnosis**  Time Code Sepsis Called/Page Received: 1449  Antibiotics Ordered: Aztreonam + Flagyl + Vancomycin  Time of 1st antibiotic administration: 1547  Additional action taken by pharmacy: Called - patient is getting CT  If necessary, Name of Provider/Nurse Contacted: [name], RN    John Reilly, PharmD Pharmacy Resident  08/07/2018 2:50 PM

## 2018-08-07 NOTE — Consult Note (Signed)
PULMONARY / CRITICAL CARE MEDICINE  Name: John Reilly MRN: [id number] DOB: [birth year]    LOS: 0  Referring Provider: Dr. Brett Albino Reason for Referral: Severe hypoglycemia and traumatic fall  HPI: 63 year old male with a medical history as indicated below, severe Parkinson's disease with difficulty ambulating who presented to the ED following a mechanical fall at home.  His ED work-up revealed a blood glucose of 507 mg/dL, potassium of 5.2, creatinine of 1.5, anion gap of 25, lactic acid of 3.1, WBC of 15.3 and a CK level of 1647.  CT of his pelvis showed a left hip fracture.  The rest of his imaging studies were unremarkable.  He was also noted to be febrile with a temperature of 101.1.  He is being admitted to the ICU for IV insulin therapy.  Past Medical History:  Diagnosis Date  . Cancer ([place])    Pt reports on 10/02/17 that he has never been dx with cancer  . COPD (chronic obstructive pulmonary disease) ([place])   . Diabetes mellitus without complication ([place])   . GERD (gastroesophageal reflux disease)   . Hypertension   . Multiple duodenal ulcers   . Peripheral vascular disease ([facility])    Peripheral Neuropathy  . Retinopathy    Past Surgical History:  Procedure Laterality Date  . ANKLE ARTHROPLASTY     2013  . COLONOSCOPY WITH PROPOFOL N/A 10/12/2016   Procedure: COLONOSCOPY WITH PROPOFOL;  Surgeon: Manya Silvas, MD;  Location: [facility] ENDOSCOPY;  Service: Endoscopy;  Laterality: N/A;   Prior to Admission medications   Medication Sig Start Date End Date Taking? Authorizing Provider  amLODipine (NORVASC) 5 MG tablet Take 5 mg by mouth daily.   Yes [provider]  clopidogrel (PLAVIX) 75 MG tablet Take 75 mg by mouth daily.   Yes [provider]  donepezil (ARICEPT) 5 MG tablet Take 1 tablet (5 mg total) by mouth at bedtime. 01/03/18 02/12/18 Yes Sowles, Drue Stager, MD  empagliflozin (JARDIANCE) 25 MG TABS tablet Take 25 mg by mouth daily.   Yes [provider]   glycopyrrolate (ROBINUL) 1 MG tablet Take 1 mg by mouth 2 (two) times daily.   Yes [provider]  insulin aspart (NOVOLOG FLEXPEN) 100 UNIT/ML FlexPen Inject 12 Units into the skin 2 (two) times daily.   Yes [provider]  insulin aspart (NOVOLOG) 100 UNIT/ML FlexPen Inject 18 Units into the skin daily. At 1700   Yes [provider]  Insulin Degludec-Liraglutide (XULTOPHY) 100-3.6 UNIT-MG/ML SOPN Inject 50 Units into the skin daily.   Yes [provider]  levETIRAcetam (KEPPRA) 500 MG tablet Take 500 mg by mouth 2 (two) times daily.   Yes [provider]  lipase/protease/amylase (CREON) 12000 units CPEP capsule Take 6,000 Units by mouth 3 (three) times daily before meals.   Yes [provider]  lipase/protease/amylase (CREON) 12000 units CPEP capsule Take 3,000 Units by mouth at bedtime. With snack   Yes [provider]  lisinopril (PRINIVIL,ZESTRIL) 5 MG tablet Take 5 mg by mouth daily.   Yes [provider]  metoprolol succinate (TOPROL-XL) 25 MG 24 hr tablet Take 1 tablet (25 mg total) by mouth daily. 01/03/18  Yes Sowles, Drue Stager, MD  rosuvastatin (CRESTOR) 40 MG tablet Take 1 tablet (40 mg total) by mouth daily. 01/03/18 02/12/18 Yes Steele Sizer, MD  aspirin EC 81 MG tablet Take 81 mg by mouth daily.    [provider]  famotidine (PEPCID) 20 MG tablet Take 1 tablet (20  mg total) by mouth 2 (two) times daily. 01/03/18 02/02/18  Steele Sizer, MD  gabapentin (NEURONTIN) 300 MG capsule Take 1 capsule (300 mg total) by mouth 2 (two) times daily. 01/03/18 02/02/18  Steele Sizer, MD  insulin glargine (LANTUS) 100 UNIT/ML injection Inject 0.1 mLs (10 Units total) into the skin daily. 01/03/18 02/02/18  Steele Sizer, MD  lacosamide 100 MG TABS Take 1 tablet (100 mg total) by mouth 2 (two) times daily. Patient not taking: Reported on 02/12/2018 05/21/17   Fritzi Mandes, MD  promethazine (PHENERGAN) 12.5 MG tablet Take  1 tablet (12.5 mg total) by mouth every 6 (six) hours as needed for nausea or vomiting. Patient not taking: Reported on 02/12/2018 03/09/17   Stark Klein, MD  sertraline (ZOLOFT) 25 MG tablet Take 1 tablet (25 mg total) by mouth daily. Patient not taking: Reported on 02/12/2018 01/03/18   Steele Sizer, MD   Allergies Allergies  Allergen Reactions  . Penicillins Rash    Has patient had a PCN reaction causing immediate rash, facial/tongue/throat swelling, SOB or lightheadedness with hypotension: Yes Has patient had a PCN reaction causing severe rash involving mucus membranes or skin necrosis: No Has patient had a PCN reaction that required hospitalization: No Has patient had a PCN reaction occurring within the last 10 years: No If all of the above answers are "NO", then may proceed with Cephalosporin use.    Family History Family History  Problem Relation Age of Onset  . Stroke Mother   . Alzheimer's disease Father   . Diabetes Brother    Social History  reports that he quit smoking about 12 years ago. He has never used smokeless tobacco. He reports current alcohol use of about 42.0 standard drinks of alcohol per week. He reports that he does not use drugs.  Review Of Systems: Unable to obtain as patient is somnolent  VITAL SIGNS: BP 105/69   Pulse (!) 113   Temp 99.7 F (37.6 C) (Axillary)   Resp (!) 26   Ht 6' (1.829 m)   Wt 74.4 kg   SpO2 99%   BMI 22.25 kg/m   HEMODYNAMICS:    VENTILATOR SETTINGS:    INTAKE / OUTPUT: I/O last 3 completed shifts: In: 1100 [IV Piggyback:1100] Out: -   PHYSICAL EXAMINATION: General: Lying in bed, in no acute distress HEENT: PERRLA, trachea midline, no JVD Neuro: somnolent, awakens to voice and noxious stimulus Cardiovascular:  RRR, S1/S2, no MRG, +2 pulses, no edema Lungs:  Bilateral breath sounds, diminished in the bases Abdomen: Nondistended, normal bowel sounds in all 4 quadrants, palpation reveals no  organomegaly Musculoskeletal: Left leg externally rotated, no other joint deformities  skin: Mild bruising on the outer aspect of left thigh LABS:  BMET Recent Labs  Lab 08/07/18 1506 08/07/18 1928  NA 122* 124*  K 5.2* 4.2  CL 83* 91*  CO2 14* 16*  BUN 22 25*  CREATININE 1.58* 1.77*  GLUCOSE 507* 340*    Electrolytes Recent Labs  Lab 08/07/18 1506 08/07/18 1928  CALCIUM 8.6* 7.9*    CBC Recent Labs  Lab 08/07/18 1506  WBC 15.3*  HGB 10.9*  HCT 32.9*  PLT 423*    Coag's No results for input(s): APTT, INR in the last 168 hours.  Sepsis Markers Recent Labs  Lab 08/07/18 1506 08/07/18 1758 08/07/18 1928  LATICACIDVEN 3.1* 1.8 1.9    ABG No results for input(s): PHART, PCO2ART, PO2ART in the last 168 hours.  Liver Enzymes Recent Labs  Lab  08/07/18 1506  AST 56*  ALT 16  ALKPHOS 112  BILITOT 1.7*  ALBUMIN 3.6    Cardiac Enzymes No results for input(s): TROPONINI, PROBNP in the last 168 hours.  Glucose Recent Labs  Lab 08/07/18 1442 08/07/18 1637 08/07/18 1753 08/07/18 2009 08/07/18 2105  GLUCAP 452* 497* 435* 282* 227*    Imaging Dg Chest 1 View  Result Date: 08/07/2018 CLINICAL DATA:  Fall. EXAM: CHEST  1 VIEW COMPARISON:  Radiograph October 16, 2017. FINDINGS: The heart size and mediastinal contours are within normal limits. Both lungs are clear. No pneumothorax or pleural effusion is noted. Old left rib fractures are noted. IMPRESSION: No active disease. Electronically Signed   By: Marijo Conception, M.D.   On: 08/07/2018 15:51   Ct Head Wo Contrast  Result Date: 08/07/2018 CLINICAL DATA:  Fall at home.  Hyperglycemia.  Left facial bruising. EXAM: CT HEAD WITHOUT CONTRAST CT MAXILLOFACIAL WITHOUT CONTRAST CT CERVICAL SPINE WITHOUT CONTRAST TECHNIQUE: Multidetector CT imaging of the head, cervical spine, and maxillofacial structures were performed using the standard protocol without intravenous contrast. Multiplanar CT image reconstructions  of the cervical spine and maxillofacial structures were also generated. COMPARISON:  Multiple exams, including 10/02/2017 FINDINGS: CT HEAD FINDINGS Brain: The brainstem, cerebellum, cerebral peduncles, thalami, basal ganglia, basilar cisterns, and ventricular system appear within normal limits. Periventricular white matter and corona radiata hypodensities favor chronic ischemic microvascular white matter disease. The previous low-density left frontal extra-axial fluid prominence is no longer present. Vascular: Unremarkable Skull: Unremarkable Other: Left lateral periorbital soft tissue swelling. CT MAXILLOFACIAL FINDINGS Osseous: No facial fractures identified. The patient has several likely mandibular dental cavities. Orbits: No intraorbital/postseptal abnormality. The globes appear intact. Sinuses: Unremarkable Soft tissues: Left periorbital soft tissue swelling/subcutaneous hematoma, especially lateral. There is potentially some soft tissue swelling along the left chin. CT CERVICAL SPINE FINDINGS Alignment: No vertebral subluxation is observed. Skull base and vertebrae: No cervical spine fracture identified. No acute bony findings. Soft tissues and spinal canal: Right common carotid atherosclerotic calcification. Disc levels:  No bony impingement identified. Upper chest: Unremarkable Other: No supplemental non-categorized findings. IMPRESSION: 1. Left periorbital soft tissue swelling/subcutaneous hematoma, without intraorbital extension and without facial fracture. 2. No acute intracranial findings. No acute cervical spine findings. 3. Periventricular white matter and corona radiata hypodensities favor chronic ischemic microvascular white matter disease. 4. Mild right common carotid artery atherosclerotic calcification. 5. Several mandibular dental cavities. Electronically Signed   By: Van Clines M.D.   On: 08/07/2018 15:55   Ct Cervical Spine Wo Contrast  Result Date: 08/07/2018 CLINICAL DATA:  Fall  at home.  Hyperglycemia.  Left facial bruising. EXAM: CT HEAD WITHOUT CONTRAST CT MAXILLOFACIAL WITHOUT CONTRAST CT CERVICAL SPINE WITHOUT CONTRAST TECHNIQUE: Multidetector CT imaging of the head, cervical spine, and maxillofacial structures were performed using the standard protocol without intravenous contrast. Multiplanar CT image reconstructions of the cervical spine and maxillofacial structures were also generated. COMPARISON:  Multiple exams, including 10/02/2017 FINDINGS: CT HEAD FINDINGS Brain: The brainstem, cerebellum, cerebral peduncles, thalami, basal ganglia, basilar cisterns, and ventricular system appear within normal limits. Periventricular white matter and corona radiata hypodensities favor chronic ischemic microvascular white matter disease. The previous low-density left frontal extra-axial fluid prominence is no longer present. Vascular: Unremarkable Skull: Unremarkable Other: Left lateral periorbital soft tissue swelling. CT MAXILLOFACIAL FINDINGS Osseous: No facial fractures identified. The patient has several likely mandibular dental cavities. Orbits: No intraorbital/postseptal abnormality. The globes appear intact. Sinuses: Unremarkable Soft tissues: Left periorbital soft tissue swelling/subcutaneous  hematoma, especially lateral. There is potentially some soft tissue swelling along the left chin. CT CERVICAL SPINE FINDINGS Alignment: No vertebral subluxation is observed. Skull base and vertebrae: No cervical spine fracture identified. No acute bony findings. Soft tissues and spinal canal: Right common carotid atherosclerotic calcification. Disc levels:  No bony impingement identified. Upper chest: Unremarkable Other: No supplemental non-categorized findings. IMPRESSION: 1. Left periorbital soft tissue swelling/subcutaneous hematoma, without intraorbital extension and without facial fracture. 2. No acute intracranial findings. No acute cervical spine findings. 3. Periventricular white matter  and corona radiata hypodensities favor chronic ischemic microvascular white matter disease. 4. Mild right common carotid artery atherosclerotic calcification. 5. Several mandibular dental cavities. Electronically Signed   By: Van Clines M.D.   On: 08/07/2018 15:55   Ct Pelvis Wo Contrast  Result Date: 08/07/2018 CLINICAL DATA:  Fall.  Deformity of the left hip. EXAM: CT PELVIS WITHOUT CONTRAST TECHNIQUE: Multidetector CT imaging of the pelvis was performed following the standard protocol without intravenous contrast. COMPARISON:  None. FINDINGS: Urinary Tract:  Unremarkable Bowel:  Unremarkable Vascular/Lymphatic: Unremarkable Reproductive:  Unremarkable Other:  No supplemental non-categorized findings. Musculoskeletal: Comminuted intertrochanteric fracture of the left hip. Multiple lesser trochanter fragments and several greater trochanter fragments. Mild varus angulation. Remote L4 compression fracture. The patient's right hand was included in imaging incidentally. There is evidence of plate and screw fixator along the proximal phalanx of the thumb, what appears to be some callus formation at likely chronic deformity of the index finger metacarpal, correlate with patient history. IMPRESSION: 1. Acute comminuted intratrochanteric fracture of the left proximal femur. Mild Verus angulation. 2. Remote L4 compression fracture. 3. Old deformities in the right hand which was incidentally included in the imaging volume. Electronically Signed   By: Van Clines M.D.   On: 08/07/2018 16:12   Dg Shoulder Left  Result Date: 08/07/2018 CLINICAL DATA:  Fall. EXAM: LEFT SHOULDER - 2+ VIEW COMPARISON:  Radiographs of Oct 02, 2017. FINDINGS: Old displaced proximal left humeral head and neck fracture is noted. Heterotopic bone formation and callus is noted consistent with old fracture. Glenohumeral joint is not well visualized due to overlying fracture. Acromioclavicular joint appears normal. Old left rib  fractures are noted. IMPRESSION: Old proximal left humeral fracture is noted. No definite acute abnormality is noted. Electronically Signed   By: Marijo Conception, M.D.   On: 08/07/2018 15:50   Ct Maxillofacial Wo Contrast  Result Date: 08/07/2018 CLINICAL DATA:  Fall at home.  Hyperglycemia.  Left facial bruising. EXAM: CT HEAD WITHOUT CONTRAST CT MAXILLOFACIAL WITHOUT CONTRAST CT CERVICAL SPINE WITHOUT CONTRAST TECHNIQUE: Multidetector CT imaging of the head, cervical spine, and maxillofacial structures were performed using the standard protocol without intravenous contrast. Multiplanar CT image reconstructions of the cervical spine and maxillofacial structures were also generated. COMPARISON:  Multiple exams, including 10/02/2017 FINDINGS: CT HEAD FINDINGS Brain: The brainstem, cerebellum, cerebral peduncles, thalami, basal ganglia, basilar cisterns, and ventricular system appear within normal limits. Periventricular white matter and corona radiata hypodensities favor chronic ischemic microvascular white matter disease. The previous low-density left frontal extra-axial fluid prominence is no longer present. Vascular: Unremarkable Skull: Unremarkable Other: Left lateral periorbital soft tissue swelling. CT MAXILLOFACIAL FINDINGS Osseous: No facial fractures identified. The patient has several likely mandibular dental cavities. Orbits: No intraorbital/postseptal abnormality. The globes appear intact. Sinuses: Unremarkable Soft tissues: Left periorbital soft tissue swelling/subcutaneous hematoma, especially lateral. There is potentially some soft tissue swelling along the left chin. CT CERVICAL SPINE FINDINGS Alignment: No vertebral subluxation is  observed. Skull base and vertebrae: No cervical spine fracture identified. No acute bony findings. Soft tissues and spinal canal: Right common carotid atherosclerotic calcification. Disc levels:  No bony impingement identified. Upper chest: Unremarkable Other: No  supplemental non-categorized findings. IMPRESSION: 1. Left periorbital soft tissue swelling/subcutaneous hematoma, without intraorbital extension and without facial fracture. 2. No acute intracranial findings. No acute cervical spine findings. 3. Periventricular white matter and corona radiata hypodensities favor chronic ischemic microvascular white matter disease. 4. Mild right common carotid artery atherosclerotic calcification. 5. Several mandibular dental cavities. Electronically Signed   By: Van Clines M.D.   On: 08/07/2018 15:55    STUDIES:  None CULTURES: Blood cultures x2 Rule out COVID-19  ANTIBIOTICS: Aztreonam 3/29>  SIGNIFICANT EVENTS: 08/07/2018: Admitted  LINES/TUBES: Peripheral IVs Foley catheter  DISCUSSION: 63 year old male admitted with a mechanical fall resulting in a left hip fracture, uncontrolled type 2 diabetes and DKA  ASSESSMENT DKA likely due to insulin pump malfunction Mechanical fall with left hip fracture Rhabdomyolysis Acute renal failure Acute metabolic encephalopathy due to hypoglycemia Sepsis of unknown source-rule out COVID-19 Parkinson's disease  PLAN Hemodynamic monitoring per ICU protocol DKA protocol Awaiting Ortho consult IV hydration Trend renal indices Monitor and correct electrolytes Monitor mental status Trend procalcitonin and adjust antibiotics Follow-up COVID-19 swab and maintain on low risk precautions Resume all other home medications  Best Practice: Code Status: Full code Diet: N.p.o. until mental status improves and then resume heart healthy diet GI prophylaxis: Protonix VTE prophylaxis: SCDs and subcu heparin  COVID-19 DISASTER DECLARATION:   FULL CONTACT PHYSICAL EXAMINATION WAS NOT POSSIBLE DUE TO TREATMENT OF COVID-19  AND CONSERVATION OF PERSONAL PROTECTIVE EQUIPMENT, LIMITED EXAM FINDINGS INCLUDE-   Patient assessed or the symptoms described in the history of present illness.  In the context of the  Global COVID-19 pandemic, which necessitated consideration that the patient might be at risk for infection with the SARS-CoV-2 virus that causes COVID-19, Institutional protocols and algorithms that pertain to the evaluation of patients at risk for COVID-19 are in a state of rapid change based on information released by regulatory bodies including the CDC and federal and state organizations. These policies and algorithms were followed during the patient's care while in hospital.   FAMILY  - Updates: No family at bedside.  Will update when available  Cristine Daw S. Genoveva Ill ANP-BC Pulmonary and [place] Pager [phone] or [phone]  NB: This document was prepared using Dragon voice recognition software and may include unintentional dictation errors.   08/07/2018, 9:14 PM

## 2018-08-07 NOTE — ED Notes (Signed)
This RN transporting to the floor

## 2018-08-07 NOTE — ED Notes (Signed)
ED TO INPATIENT HANDOFF REPORT  ED Nurse Name and Phone #: bill [phone]  S Name/Age/Gender John Reilly 63 y.o. male Room/Bed: ED04A/ED04A  Code Status   Code Status: Prior  Home/SNF/Other Home Patient oriented to: self Is this baseline? unknown  Triage Complete: Triage complete  Chief Complaint fall  Triage Note Pt ems from home s/p fall. Time down uncertain. Noted to be hyperglycemic at scene. 452 here. Pt with odd shaped left hip, bruising to left face, left shoulder. Pt with insulin pump with the cath pulled out of abd on arrival. Pump removed and turned off.   Allergies Allergies  Allergen Reactions  . Penicillins Rash    Has patient had a PCN reaction causing immediate rash, facial/tongue/throat swelling, SOB or lightheadedness with hypotension: Yes Has patient had a PCN reaction causing severe rash involving mucus membranes or skin necrosis: No Has patient had a PCN reaction that required hospitalization: No Has patient had a PCN reaction occurring within the last 10 years: No If all of the above answers are "NO", then may proceed with Cephalosporin use.    Level of Care/Admitting Diagnosis ED Disposition    ED Disposition Condition [facility] Area: [place] [[id number]]  Level of Care: Stepdown [14]  Diagnosis: DKA (diabetic ketoacidoses) [facility]) [[id number]]  Admitting Physician: Hyman Bible DODD [[id number]]  Attending Physician: Hyman Bible DODD [[id number]]  Estimated length of stay: past midnight tomorrow  Certification:: I certify this patient will need inpatient services for at least 2 midnights  PT Class (Do Not Modify): Inpatient [101]  PT Acc Code (Do Not Modify): Private [1]       B Medical/Surgery History Past Medical History:  Diagnosis Date  . Cancer ([place])    Pt reports on 10/02/17 that he has never been dx with cancer  . COPD (chronic obstructive pulmonary disease) ([place])   . Diabetes mellitus without complication  ([place])   . GERD (gastroesophageal reflux disease)   . Hypertension   . Multiple duodenal ulcers   . Peripheral vascular disease ([facility])    Peripheral Neuropathy  . Retinopathy    Past Surgical History:  Procedure Laterality Date  . ANKLE ARTHROPLASTY     2013  . COLONOSCOPY WITH PROPOFOL N/A 10/12/2016   Procedure: COLONOSCOPY WITH PROPOFOL;  Surgeon: Manya Silvas, MD;  Location: [facility] ENDOSCOPY;  Service: Endoscopy;  Laterality: N/A;     A IV Location/Drains/Wounds Patient Lines/Drains/Airways Status   Active Line/Drains/Airways    Name:   Placement date:   Placement time:   Site:   Days:   Peripheral IV 08/07/18 Left Antecubital   08/07/18    1456    Antecubital   less than 1   Peripheral IV 08/07/18 Left Forearm   08/07/18    1728    Forearm   less than 1          Intake/Output Last 24 hours  Intake/Output Summary (Last 24 hours) at 08/07/2018 1809 Last data filed at 08/07/2018 1701 Gross per 24 hour  Intake 1100 ml  Output -  Net 1100 ml    Labs/Imaging Results for orders placed or performed during the hospital encounter of 08/07/18 (from the past 48 hour(s))  Glucose, capillary     Status: Abnormal   Collection Time: 08/07/18  2:42 PM  Result Value Ref Range   Glucose-Capillary 452 (H) 70 - 99 mg/dL  Lactic acid, plasma     Status: Abnormal   Collection Time: 08/07/18  3:06 PM  Result Value Ref Range   Lactic Acid, Venous 3.1 (HH) 0.5 - 1.9 mmol/L    Comment: CRITICAL RESULT CALLED TO, READ BACK BY AND VERIFIED WITH [name] Vernal Hritz @1549  08/07/18 AKT Performed at [facility], [address] [place], [place] [address]   Comprehensive metabolic panel     Status: Abnormal   Collection Time: 08/07/18  3:06 PM  Result Value Ref Range   Sodium 122 (L) 135 - 145 mmol/L   Potassium 5.2 (H) 3.5 - 5.1 mmol/L   Chloride 83 (L) 98 - 111 mmol/L   CO2 14 (L) 22 - 32 mmol/L   Glucose, Bld 507 (HH) 70 - 99 mg/dL    Comment: CRITICAL RESULT CALLED TO, READ BACK BY  AND VERIFIED WITH [name] Amarri Michaelson @1606  08/07/18 AKT   BUN 22 8 - 23 mg/dL   Creatinine, Ser 1.58 (H) 0.61 - 1.24 mg/dL   Calcium 8.6 (L) 8.9 - 10.3 mg/dL   Total Protein 7.1 6.5 - 8.1 g/dL   Albumin 3.6 3.5 - 5.0 g/dL   AST 56 (H) 15 - 41 U/L   ALT 16 0 - 44 U/L   Alkaline Phosphatase 112 38 - 126 U/L   Total Bilirubin 1.7 (H) 0.3 - 1.2 mg/dL   GFR calc non Af Amer 46 (L) >60 mL/min   GFR calc Af Amer 53 (L) >60 mL/min   Anion gap 25 (H) 5 - 15    Comment: Performed at [facility], [address] [place], [place] [address]  CBC WITH DIFFERENTIAL     Status: Abnormal   Collection Time: 08/07/18  3:06 PM  Result Value Ref Range   WBC 15.3 (H) 4.0 - 10.5 K/uL   RBC 3.45 (L) 4.22 - 5.81 MIL/uL   Hemoglobin 10.9 (L) 13.0 - 17.0 g/dL   HCT 32.9 (L) 39.0 - 52.0 %   MCV 95.4 80.0 - 100.0 fL   MCH 31.6 26.0 - 34.0 pg   MCHC 33.1 30.0 - 36.0 g/dL   RDW 12.3 11.5 - 15.5 %   Platelets 423 (H) 150 - 400 K/uL   nRBC 0.0 0.0 - 0.2 %   Neutrophils Relative % 90 %   Neutro Abs 13.9 (H) 1.7 - 7.7 K/uL   Lymphocytes Relative 5 %   Lymphs Abs 0.7 0.7 - 4.0 K/uL   Monocytes Relative 4 %   Monocytes Absolute 0.5 0.1 - 1.0 K/uL   Eosinophils Relative 0 %   Eosinophils Absolute 0.0 0.0 - 0.5 K/uL   Basophils Relative 0 %   Basophils Absolute 0.0 0.0 - 0.1 K/uL   Immature Granulocytes 1 %   Abs Immature Granulocytes 0.13 (H) 0.00 - 0.07 K/uL    Comment: Performed at [facility], [address] [place], [place] [address]  CK     Status: Abnormal   Collection Time: 08/07/18  3:06 PM  Result Value Ref Range   Total CK 1,647 (H) 49 - 397 U/L    Comment: Performed at [facility], [address] [place], [place] [address]  Urinalysis, Routine w reflex microscopic     Status: Abnormal   Collection Time: 08/07/18  3:07 PM  Result Value Ref Range   Color, Urine YELLOW (A) YELLOW   APPearance HAZY (A) CLEAR   Specific Gravity, Urine 1.017 1.005 - 1.030   pH 5.0 5.0 -  8.0   Glucose, UA >=500 (A) NEGATIVE mg/dL   Hgb urine dipstick LARGE (A)  NEGATIVE   Bilirubin Urine NEGATIVE NEGATIVE   Ketones, ur 80 (A) NEGATIVE mg/dL   Protein, ur 100 (A) NEGATIVE mg/dL   Nitrite NEGATIVE NEGATIVE   Leukocytes,Ua NEGATIVE NEGATIVE   RBC / HPF 0-5 0 - 5 RBC/hpf   WBC, UA 21-50 0 - 5 WBC/hpf   Bacteria, UA RARE (A) NONE SEEN   Squamous Epithelial / LPF NONE SEEN 0 - 5   Mucus PRESENT    Hyaline Casts, UA PRESENT     Comment: Performed at [facility], [address] [place], [place] [address]  Blood gas, venous     Status: Abnormal (Preliminary result)   Collection Time: 08/07/18  3:07 PM  Result Value Ref Range   pH, Ven 7.28 7.250 - 7.430   pCO2, Ven 31 (L) 44.0 - 60.0 mmHg   pO2, Ven PENDING 32.0 - 45.0 mmHg   Patient temperature 37.0    Collection site VENOUS    Sample type VENOUS     Comment: Performed at [facility], [address] [place], [place] [address]  Ethanol     Status: None   Collection Time: 08/07/18  3:08 PM  Result Value Ref Range   Alcohol, Ethyl (B) <10 <10 mg/dL    Comment: (NOTE) Lowest detectable limit for serum alcohol is 10 mg/dL. For medical purposes only. Performed at [facility], [address] [place], [place] [address]   Glucose, capillary     Status: Abnormal   Collection Time: 08/07/18  4:37 PM  Result Value Ref Range   Glucose-Capillary 497 (H) 70 - 99 mg/dL   Comment 1 Notify RN    Comment 2 Document in Chart   Glucose, capillary     Status: Abnormal   Collection Time: 08/07/18  5:53 PM  Result Value Ref Range   Glucose-Capillary 435 (H) 70 - 99 mg/dL   Dg Chest 1 View  Result Date: 08/07/2018 CLINICAL DATA:  Fall. EXAM: CHEST  1 VIEW COMPARISON:  Radiograph October 16, 2017. FINDINGS: The heart size and mediastinal contours are within normal limits. Both lungs are clear. No pneumothorax or pleural effusion is noted. Old left rib fractures are noted. IMPRESSION: No active disease.  Electronically Signed   By: Marijo Conception, M.D.   On: 08/07/2018 15:51   Ct Head Wo Contrast  Result Date: 08/07/2018 CLINICAL DATA:  Fall at home.  Hyperglycemia.  Left facial bruising. EXAM: CT HEAD WITHOUT CONTRAST CT MAXILLOFACIAL WITHOUT CONTRAST CT CERVICAL SPINE WITHOUT CONTRAST TECHNIQUE: Multidetector CT imaging of the head, cervical spine, and maxillofacial structures were performed using the standard protocol without intravenous contrast. Multiplanar CT image reconstructions of the cervical spine and maxillofacial structures were also generated. COMPARISON:  Multiple exams, including 10/02/2017 FINDINGS: CT HEAD FINDINGS Brain: The brainstem, cerebellum, cerebral peduncles, thalami, basal ganglia, basilar cisterns, and ventricular system appear within normal limits. Periventricular white matter and corona radiata hypodensities favor chronic ischemic microvascular white matter disease. The previous low-density left frontal extra-axial fluid prominence is no longer present. Vascular: Unremarkable Skull: Unremarkable Other: Left lateral periorbital soft tissue swelling. CT MAXILLOFACIAL FINDINGS Osseous: No facial fractures identified. The patient has several likely mandibular dental cavities. Orbits: No intraorbital/postseptal abnormality. The globes appear intact. Sinuses: Unremarkable Soft tissues: Left periorbital soft tissue swelling/subcutaneous hematoma, especially lateral. There is potentially some soft tissue swelling along the left chin. CT CERVICAL SPINE FINDINGS Alignment: No vertebral subluxation is observed. Skull base and vertebrae: No cervical spine fracture identified. No acute bony  findings. Soft tissues and spinal canal: Right common carotid atherosclerotic calcification. Disc levels:  No bony impingement identified. Upper chest: Unremarkable Other: No supplemental non-categorized findings. IMPRESSION: 1. Left periorbital soft tissue swelling/subcutaneous hematoma, without  intraorbital extension and without facial fracture. 2. No acute intracranial findings. No acute cervical spine findings. 3. Periventricular white matter and corona radiata hypodensities favor chronic ischemic microvascular white matter disease. 4. Mild right common carotid artery atherosclerotic calcification. 5. Several mandibular dental cavities. Electronically Signed   By: Van Clines M.D.   On: 08/07/2018 15:55   Ct Cervical Spine Wo Contrast  Result Date: 08/07/2018 CLINICAL DATA:  Fall at home.  Hyperglycemia.  Left facial bruising. EXAM: CT HEAD WITHOUT CONTRAST CT MAXILLOFACIAL WITHOUT CONTRAST CT CERVICAL SPINE WITHOUT CONTRAST TECHNIQUE: Multidetector CT imaging of the head, cervical spine, and maxillofacial structures were performed using the standard protocol without intravenous contrast. Multiplanar CT image reconstructions of the cervical spine and maxillofacial structures were also generated. COMPARISON:  Multiple exams, including 10/02/2017 FINDINGS: CT HEAD FINDINGS Brain: The brainstem, cerebellum, cerebral peduncles, thalami, basal ganglia, basilar cisterns, and ventricular system appear within normal limits. Periventricular white matter and corona radiata hypodensities favor chronic ischemic microvascular white matter disease. The previous low-density left frontal extra-axial fluid prominence is no longer present. Vascular: Unremarkable Skull: Unremarkable Other: Left lateral periorbital soft tissue swelling. CT MAXILLOFACIAL FINDINGS Osseous: No facial fractures identified. The patient has several likely mandibular dental cavities. Orbits: No intraorbital/postseptal abnormality. The globes appear intact. Sinuses: Unremarkable Soft tissues: Left periorbital soft tissue swelling/subcutaneous hematoma, especially lateral. There is potentially some soft tissue swelling along the left chin. CT CERVICAL SPINE FINDINGS Alignment: No vertebral subluxation is observed. Skull base and  vertebrae: No cervical spine fracture identified. No acute bony findings. Soft tissues and spinal canal: Right common carotid atherosclerotic calcification. Disc levels:  No bony impingement identified. Upper chest: Unremarkable Other: No supplemental non-categorized findings. IMPRESSION: 1. Left periorbital soft tissue swelling/subcutaneous hematoma, without intraorbital extension and without facial fracture. 2. No acute intracranial findings. No acute cervical spine findings. 3. Periventricular white matter and corona radiata hypodensities favor chronic ischemic microvascular white matter disease. 4. Mild right common carotid artery atherosclerotic calcification. 5. Several mandibular dental cavities. Electronically Signed   By: Van Clines M.D.   On: 08/07/2018 15:55   Ct Pelvis Wo Contrast  Result Date: 08/07/2018 CLINICAL DATA:  Fall.  Deformity of the left hip. EXAM: CT PELVIS WITHOUT CONTRAST TECHNIQUE: Multidetector CT imaging of the pelvis was performed following the standard protocol without intravenous contrast. COMPARISON:  None. FINDINGS: Urinary Tract:  Unremarkable Bowel:  Unremarkable Vascular/Lymphatic: Unremarkable Reproductive:  Unremarkable Other:  No supplemental non-categorized findings. Musculoskeletal: Comminuted intertrochanteric fracture of the left hip. Multiple lesser trochanter fragments and several greater trochanter fragments. Mild varus angulation. Remote L4 compression fracture. The patient's right hand was included in imaging incidentally. There is evidence of plate and screw fixator along the proximal phalanx of the thumb, what appears to be some callus formation at likely chronic deformity of the index finger metacarpal, correlate with patient history. IMPRESSION: 1. Acute comminuted intratrochanteric fracture of the left proximal femur. Mild Verus angulation. 2. Remote L4 compression fracture. 3. Old deformities in the right hand which was incidentally included in the  imaging volume. Electronically Signed   By: Van Clines M.D.   On: 08/07/2018 16:12   Dg Shoulder Left  Result Date: 08/07/2018 CLINICAL DATA:  Fall. EXAM: LEFT SHOULDER - 2+ VIEW COMPARISON:  Radiographs of Oct 02, 2017.  FINDINGS: Old displaced proximal left humeral head and neck fracture is noted. Heterotopic bone formation and callus is noted consistent with old fracture. Glenohumeral joint is not well visualized due to overlying fracture. Acromioclavicular joint appears normal. Old left rib fractures are noted. IMPRESSION: Old proximal left humeral fracture is noted. No definite acute abnormality is noted. Electronically Signed   By: Marijo Conception, M.D.   On: 08/07/2018 15:50   Ct Maxillofacial Wo Contrast  Result Date: 08/07/2018 CLINICAL DATA:  Fall at home.  Hyperglycemia.  Left facial bruising. EXAM: CT HEAD WITHOUT CONTRAST CT MAXILLOFACIAL WITHOUT CONTRAST CT CERVICAL SPINE WITHOUT CONTRAST TECHNIQUE: Multidetector CT imaging of the head, cervical spine, and maxillofacial structures were performed using the standard protocol without intravenous contrast. Multiplanar CT image reconstructions of the cervical spine and maxillofacial structures were also generated. COMPARISON:  Multiple exams, including 10/02/2017 FINDINGS: CT HEAD FINDINGS Brain: The brainstem, cerebellum, cerebral peduncles, thalami, basal ganglia, basilar cisterns, and ventricular system appear within normal limits. Periventricular white matter and corona radiata hypodensities favor chronic ischemic microvascular white matter disease. The previous low-density left frontal extra-axial fluid prominence is no longer present. Vascular: Unremarkable Skull: Unremarkable Other: Left lateral periorbital soft tissue swelling. CT MAXILLOFACIAL FINDINGS Osseous: No facial fractures identified. The patient has several likely mandibular dental cavities. Orbits: No intraorbital/postseptal abnormality. The globes appear intact. Sinuses:  Unremarkable Soft tissues: Left periorbital soft tissue swelling/subcutaneous hematoma, especially lateral. There is potentially some soft tissue swelling along the left chin. CT CERVICAL SPINE FINDINGS Alignment: No vertebral subluxation is observed. Skull base and vertebrae: No cervical spine fracture identified. No acute bony findings. Soft tissues and spinal canal: Right common carotid atherosclerotic calcification. Disc levels:  No bony impingement identified. Upper chest: Unremarkable Other: No supplemental non-categorized findings. IMPRESSION: 1. Left periorbital soft tissue swelling/subcutaneous hematoma, without intraorbital extension and without facial fracture. 2. No acute intracranial findings. No acute cervical spine findings. 3. Periventricular white matter and corona radiata hypodensities favor chronic ischemic microvascular white matter disease. 4. Mild right common carotid artery atherosclerotic calcification. 5. Several mandibular dental cavities. Electronically Signed   By: Van Clines M.D.   On: 08/07/2018 15:55    Pending Labs Unresulted Labs (From admission, onward)    Start     Ordered   08/07/18 1757  Lactic acid, plasma  Now then every 2 hours,   STAT     08/07/18 1756   08/07/18 1448  Blood Culture (routine x 2)  BLOOD CULTURE X 2,   STAT     08/07/18 1448   08/07/18 1448  Urine culture  ONCE - STAT,   STAT     08/07/18 1448   Signed and Held  Basic metabolic panel  STAT Now then every 4 hours ,   STAT     Signed and Held   Signed and Held  CK  Tomorrow morning,   R     Signed and Held   Signed and Held  Novel Coronavirus, NAA (hospital order; send-out to ref lab)  (Novel Coronavirus, NAA [facility] Order; send-out to ref lab) with precautions panel)  Once,   R    Question Answer Comment  Current symptoms Fever and Cough   Excluded other viral illnesses No (testing not indicated)   Patient immune status Immunocompromised      Signed and Held           Vitals/Pain Today's Vitals   08/07/18 1700 08/07/18 1715 08/07/18 1728 08/07/18 1808  BP:  Pulse: (!) 122 (!) 127    Resp: (!) 27 (!) 26    Temp:   (!) 101.1 F (38.4 C)   TempSrc:   Axillary   SpO2: 100% 100%    Weight:      Height:      PainSc:    5     Isolation Precautions No active isolations  Medications Medications  clindamycin (CLEOCIN) IVPB 600 mg (has no administration in time range)  insulin regular, human (MYXREDLIN) 100 units/ 100 mL infusion (7.5 Units/hr Intravenous Rate/Dose Change 08/07/18 1755)  vancomycin (VANCOCIN) 1,500 mg in sodium chloride 0.9 % 500 mL IVPB (1,500 mg Intravenous New Bag/Given 08/07/18 1704)  ceFEPIme (MAXIPIME) 2 g in sodium chloride 0.9 % 100 mL IVPB (has no administration in time range)  vancomycin (VANCOCIN) 1,250 mg in sodium chloride 0.9 % 250 mL IVPB (has no administration in time range)  aztreonam (AZACTAM) 2 g in sodium chloride 0.9 % 100 mL IVPB (0 g Intravenous Stopped 08/07/18 1634)  metroNIDAZOLE (FLAGYL) IVPB 500 mg (0 mg Intravenous Stopped 08/07/18 1701)  sodium chloride 0.9 % bolus 1,000 mL (0 mLs Intravenous Stopped 08/07/18 1636)  acetaminophen (TYLENOL) tablet 650 mg (650 mg Oral Given 08/07/18 1806)  morphine 2 MG/ML injection 2 mg (2 mg Intravenous Given 08/07/18 1803)    Mobility walks High fall risk   Focused Assessments sepsis   R Recommendations: See Admitting Provider Note  Report given to:   Additional Notes:

## 2018-08-07 NOTE — ED Triage Notes (Signed)
Pt ems from home s/p fall. Time down uncertain. Noted to be hyperglycemic at scene. 452 here. Pt with odd shaped left hip, bruising to left face, left shoulder. Pt with insulin pump with the cath pulled out of abd on arrival. Pump removed and turned off.

## 2018-08-07 NOTE — Progress Notes (Signed)
eLink Physician-Brief Progress Note Patient Name: John Reilly DOB: 1955-06-05 MRN: [id number]   Date of Service  08/07/2018  HPI/Events of Note  Request for DKA transition order. CO2 20, AG 12. Patient tolerating PO.  eICU Interventions  Start Levemir 10 tonight, may need additional dose tomorrow. Also on SSI     Intervention Category Major Interventions: Hyperglycemia - active titration of insulin therapy  [name] 08/07/2018, 11:42 PM

## 2018-08-07 NOTE — H&P (Addendum)
[facility] [place] NAME: John Reilly    MR#:  [id number]  DATE OF BIRTH:  [birth year]  DATE OF ADMISSION:  08/07/2018  PRIMARY CARE PHYSICIAN: Glendon Axe, MD   REQUESTING/REFERRING PHYSICIAN: Harvest Dark, MD  CHIEF COMPLAINT:   Chief Complaint  Patient presents with   Fall   Hyperglycemia    HISTORY OF PRESENT ILLNESS:  John Reilly  is a 63 y.o. male with a known history of hypertension, COPD, type 2 diabetes, Parkinson's who presented to the ED after a fall at home.  Per patient's wife, yesterday evening he started having high blood sugars.  He stated he was having trouble walking because his legs felt weak.  On his way back from the bathroom, he fell on the floor.  His wife was able to get him up and moved him to the couch in the den, where he typically sleeps at night.  She made sure he took his insulin, and he seemed to be fine the rest of the night.  This morning, she checked on him at 7 AM and he was doing fine.  She checked on him again at 12:30 PM, and found him laying on the floor.  He had no idea how he got there.  She called EMS.  In the ED, he was meeting sepsis criteria with fever, tachycardia, tachypnea, and leukocytosis.  Labs were significant for potassium 5.2, glucose 507, creatinine 1.58, anion gap 25, lactic acid 3.1, WBC 15.3.  CK was 1647.  Chest x-ray and UA did not show signs of an acute infection.  CT head, maxillofacial, and C-spine were unremarkable.  CT pelvis showed a left hip fracture.  Hospitalists were called for admission.  PAST MEDICAL HISTORY:   Past Medical History:  Diagnosis Date   Cancer ([place])    Pt reports on 10/02/17 that he has never been dx with cancer   COPD (chronic obstructive pulmonary disease) ([place])    Diabetes mellitus without complication (H[facility])    GERD (gastroesophageal reflux disease)    Hypertension    Multiple duodenal ulcers    Peripheral vascular disease ([place])    Peripheral Neuropathy   Retinopathy     PAST SURGICAL HISTORY:   Past Surgical History:  Procedure Laterality Date   ANKLE ARTHROPLASTY     2013   COLONOSCOPY WITH PROPOFOL N/A 10/12/2016   Procedure: COLONOSCOPY WITH PROPOFOL;  Surgeon: Manya Silvas, MD;  Location[facility] END[facility]COPY;  Service: Endoscopy;  Laterality: N/A;    SOCIAL HISTORY:   Social History   Tobacco Use   Smoking status: Former Smoker    Last attempt to quit: 10/21/2005    Years since quitting: 12.8   Smokeless tobacco: Never Used  Substance Use Topics   Alcohol use: Yes    Alcohol/week: 42.0 standard drinks    Types: 42 Cans of beer per week    Comment: patient states he drinks 1 quart of beer per day    FAMILY HISTORY:   Family History  Problem Relation Age of Onset   Stroke Mother    Alzheimer's disease Father    Diabetes Brother     DRUG ALLERGIES:   Allergies  Allergen Reactions   Penicillins Rash    Has patient had a PCN reaction causing immediate rash, facial/tongue/throat swelling, SOB or lightheadedness with hypotension: Yes Has patient had a PCN reaction causing severe rash involving mucus membranes or skin necrosis: No Has patient had a PCN reaction  that required hospitalization: No Has patient had a PCN reaction occurring within the last 10 years: No If all of the above answers are "NO", then may proceed with Cephalosporin use.    REVIEW OF SYSTEMS:   Review of Systems  Constitutional: Positive for fever and malaise/fatigue. Negative for chills.  HENT: Negative for congestion and sore throat.   Eyes: Negative for blurred vision and double vision.  Respiratory: Negative for cough and shortness of breath.   Cardiovascular: Negative for chest pain and palpitations.  Gastrointestinal: Negative for abdominal pain, nausea and vomiting.  Genitourinary: Positive for frequency. Negative for dysuria and urgency.  Musculoskeletal: Positive for falls and joint pain. Negative for  back pain and neck pain.  Neurological: Positive for weakness. Negative for dizziness, focal weakness and headaches.  Psychiatric/Behavioral: Negative for depression. The patient is not nervous/anxious.     MEDICATIONS AT HOME:   Prior to Admission medications   Medication Sig Start Date End Date Taking? Authorizing Provider  acetaminophen (TYLENOL) 325 MG tablet Take 2 tablets (650 mg total) by mouth every 4 (four) hours as needed for moderate pain (or Fever >/= 101). 10/04/17   Max Sane, MD  amLODipine (NORVASC) 5 MG tablet Take 1 tablet by mouth daily as needed (BP OVER 160/90).  07/22/17   [provider]  B Complex-Biotin-FA (SUPER B-COMPLEX) TABS Take 1 tablet by mouth daily.    [provider]  carbidopa-levodopa (SINEMET CR) 50-200 MG tablet Take 1 tablet by mouth at bedtime. 07/22/17   [provider]  carbidopa-levodopa (SINEMET IR) 25-100 MG tablet Take 2 tablets by mouth 3 (three) times daily.  04/22/17 10/19/17  [provider]  cephALEXin (KEFLEX) 500 MG capsule Take 1 capsule (500 mg total) by mouth every 8 (eight) hours. 10/20/17   Loletha Grayer, MD  folic acid (FOLVITE) 1 MG tablet Take 1 mg by mouth daily.    [provider]  glucagon 1 MG injection 1 mg by Other route once as needed (for severe hypoglycemia).     [provider]  Insulin Glargine (LANTUS) 100 UNIT/ML Solostar Pen Inject 8 Units into the skin every morning. 10/20/17   Loletha Grayer, MD  insulin lispro (HUMALOG) 100 UNIT/ML cartridge Inject 0.02 mLs (2 Units total) into the skin 3 (three) times daily with meals. 10/20/17   Loletha Grayer, MD  metoprolol succinate (TOPROL-XL) 50 MG 24 hr tablet Take 1 tablet (50 mg total) by mouth daily. Take with or immediately following a meal. 10/20/17   Loletha Grayer, MD  Multiple Vitamin (MULTIVITAMIN WITH MINERALS) TABS tablet Take 1 tablet by mouth daily.    [provider]  omeprazole (PRILOSEC) 40 MG  capsule Take 40 mg by mouth daily.    [provider]  oxyCODONE-acetaminophen (PERCOCET/ROXICET) 5-325 MG tablet Take 1 tablet by mouth every 4 (four) hours as needed for moderate pain or severe pain. 10/20/17   Loletha Grayer, MD  thiamine 100 MG tablet Take 1 tablet (100 mg total) by mouth daily. 10/20/17   Loletha Grayer, MD      VITAL SIGNS:  Blood pressure (!) 171/76, pulse (!) 113, resp. rate (!) 21, height 6' (1.829 m), weight 78 kg, SpO2 100 %.  PHYSICAL EXAMINATION:  Physical Exam  GENERAL:  63 y.o.-year-old patient lying in the bed with no acute distress.  EYES: Pupils equal, round, reactive to light and accommodation. No scleral icterus. Extraocular muscles intact.  HEENT: Normocephalic. Oropharynx and nasopharynx clear. + Swelling present over the left  periorbital area. + Dried blood present over the lips NECK:  Supple, no jugular venous distention. No thyroid enlargement, no tenderness.  LUNGS: Normal breath sounds bilaterally, no wheezing, rales,rhonchi or crepitation. No use of accessory muscles of respiration.  CARDIOVASCULAR: Tachycardic, regular rhythm, S1, S2 normal. No murmurs, rubs, or gallops.  ABDOMEN: Soft, nontender, nondistended. Bowel sounds present. No organomegaly or mass.  EXTREMITIES: No pedal edema, cyanosis, or clubbing. + Left lower extremity is externally rotated and shortened. NEUROLOGIC: Cranial nerves II through XII are intact. + Global weakness. Sensation intact. Gait not checked. + Resting tremor of right upper extremity. PSYCHIATRIC: The patient is alert and oriented x 3.  SKIN: No obvious rash, lesion, or ulcer.   LABORATORY PANEL:   CBC Recent Labs  Lab 08/07/18 1506  WBC 15.3*  HGB 10.9*  HCT 32.9*  PLT 423*   ------------------------------------------------------------------------------------------------------------------  Chemistries  Recent Labs  Lab 08/07/18 1506  NA 122*  K 5.2*  CL 83*  CO2 14*  GLUCOSE 507*    BUN 22  CREATININE 1.58*  CALCIUM 8.6*  AST 56*  ALT 16  ALKPHOS 112  BILITOT 1.7*   ------------------------------------------------------------------------------------------------------------------  Cardiac Enzymes No results for input(s): TROPONINI in the last 168 hours. ------------------------------------------------------------------------------------------------------------------  RADIOLOGY:  Dg Chest 1 View  Result Date: 08/07/2018 CLINICAL DATA:  Fall. EXAM: CHEST  1 VIEW COMPARISON:  Radiograph October 16, 2017. FINDINGS: The heart size and mediastinal contours are within normal limits. Both lungs are clear. No pneumothorax or pleural effusion is noted. Old left rib fractures are noted. IMPRESSION: No active disease. Electronically Signed   By: Marijo Conception, M.D.   On: 08/07/2018 15:51   Ct Head Wo Contrast  Result Date: 08/07/2018 CLINICAL DATA:  Fall at home.  Hyperglycemia.  Left facial bruising. EXAM: CT HEAD WITHOUT CONTRAST CT MAXILLOFACIAL WITHOUT CONTRAST CT CERVICAL SPINE WITHOUT CONTRAST TECHNIQUE: Multidetector CT imaging of the head, cervical spine, and maxillofacial structures were performed using the standard protocol without intravenous contrast. Multiplanar CT image reconstructions of the cervical spine and maxillofacial structures were also generated. COMPARISON:  Multiple exams, including 10/02/2017 FINDINGS: CT HEAD FINDINGS Brain: The brainstem, cerebellum, cerebral peduncles, thalami, basal ganglia, basilar cisterns, and ventricular system appear within normal limits. Periventricular white matter and corona radiata hypodensities favor chronic ischemic microvascular white matter disease. The previous low-density left frontal extra-axial fluid prominence is no longer present. Vascular: Unremarkable Skull: Unremarkable Other: Left lateral periorbital soft tissue swelling. CT MAXILLOFACIAL FINDINGS Osseous: No facial fractures identified. The patient has several  likely mandibular dental cavities. Orbits: No intraorbital/postseptal abnormality. The globes appear intact. Sinuses: Unremarkable Soft tissues: Left periorbital soft tissue swelling/subcutaneous hematoma, especially lateral. There is potentially some soft tissue swelling along the left chin. CT CERVICAL SPINE FINDINGS Alignment: No vertebral subluxation is observed. Skull base and vertebrae: No cervical spine fracture identified. No acute bony findings. Soft tissues and spinal canal: Right common carotid atherosclerotic calcification. Disc levels:  No bony impingement identified. Upper chest: Unremarkable Other: No supplemental non-categorized findings. IMPRESSION: 1. Left periorbital soft tissue swelling/subcutaneous hematoma, without intraorbital extension and without facial fracture. 2. No acute intracranial findings. No acute cervical spine findings. 3. Periventricular white matter and corona radiata hypodensities favor chronic ischemic microvascular white matter disease. 4. Mild right common carotid artery atherosclerotic calcification. 5. Several mandibular dental cavities. Electronically Signed   By: Van Clines M.D.   On: 08/07/2018 15:55   Ct Cervical Spine Wo Contrast  Result Date: 08/07/2018 CLINICAL DATA:  Fall  at home.  Hyperglycemia.  Left facial bruising. EXAM: CT HEAD WITHOUT CONTRAST CT MAXILLOFACIAL WITHOUT CONTRAST CT CERVICAL SPINE WITHOUT CONTRAST TECHNIQUE: Multidetector CT imaging of the head, cervical spine, and maxillofacial structures were performed using the standard protocol without intravenous contrast. Multiplanar CT image reconstructions of the cervical spine and maxillofacial structures were also generated. COMPARISON:  Multiple exams, including 10/02/2017 FINDINGS: CT HEAD FINDINGS Brain: The brainstem, cerebellum, cerebral peduncles, thalami, basal ganglia, basilar cisterns, and ventricular system appear within normal limits. Periventricular white matter and corona  radiata hypodensities favor chronic ischemic microvascular white matter disease. The previous low-density left frontal extra-axial fluid prominence is no longer present. Vascular: Unremarkable Skull: Unremarkable Other: Left lateral periorbital soft tissue swelling. CT MAXILLOFACIAL FINDINGS Osseous: No facial fractures identified. The patient has several likely mandibular dental cavities. Orbits: No intraorbital/postseptal abnormality. The globes appear intact. Sinuses: Unremarkable Soft tissues: Left periorbital soft tissue swelling/subcutaneous hematoma, especially lateral. There is potentially some soft tissue swelling along the left chin. CT CERVICAL SPINE FINDINGS Alignment: No vertebral subluxation is observed. Skull base and vertebrae: No cervical spine fracture identified. No acute bony findings. Soft tissues and spinal canal: Right common carotid atherosclerotic calcification. Disc levels:  No bony impingement identified. Upper chest: Unremarkable Other: No supplemental non-categorized findings. IMPRESSION: 1. Left periorbital soft tissue swelling/subcutaneous hematoma, without intraorbital extension and without facial fracture. 2. No acute intracranial findings. No acute cervical spine findings. 3. Periventricular white matter and corona radiata hypodensities favor chronic ischemic microvascular white matter disease. 4. Mild right common carotid artery atherosclerotic calcification. 5. Several mandibular dental cavities. Electronically Signed   By: Van Clines M.D.   On: 08/07/2018 15:55   Ct Pelvis Wo Contrast  Result Date: 08/07/2018 CLINICAL DATA:  Fall.  Deformity of the left hip. EXAM: CT PELVIS WITHOUT CONTRAST TECHNIQUE: Multidetector CT imaging of the pelvis was performed following the standard protocol without intravenous contrast. COMPARISON:  None. FINDINGS: Urinary Tract:  Unremarkable Bowel:  Unremarkable Vascular/Lymphatic: Unremarkable Reproductive:  Unremarkable Other:  No  supplemental non-categorized findings. Musculoskeletal: Comminuted intertrochanteric fracture of the left hip. Multiple lesser trochanter fragments and several greater trochanter fragments. Mild varus angulation. Remote L4 compression fracture. The patient's right hand was included in imaging incidentally. There is evidence of plate and screw fixator along the proximal phalanx of the thumb, what appears to be some callus formation at likely chronic deformity of the index finger metacarpal, correlate with patient history. IMPRESSION: 1. Acute comminuted intratrochanteric fracture of the left proximal femur. Mild Verus angulation. 2. Remote L4 compression fracture. 3. Old deformities in the right hand which was incidentally included in the imaging volume. Electronically Signed   By: Van Clines M.D.   On: 08/07/2018 16:12   Dg Shoulder Left  Result Date: 08/07/2018 CLINICAL DATA:  Fall. EXAM: LEFT SHOULDER - 2+ VIEW COMPARISON:  Radiographs of Oct 02, 2017. FINDINGS: Old displaced proximal left humeral head and neck fracture is noted. Heterotopic bone formation and callus is noted consistent with old fracture. Glenohumeral joint is not well visualized due to overlying fracture. Acromioclavicular joint appears normal. Old left rib fractures are noted. IMPRESSION: Old proximal left humeral fracture is noted. No definite acute abnormality is noted. Electronically Signed   By: Marijo Conception, M.D.   On: 08/07/2018 15:50   Ct Maxillofacial Wo Contrast  Result Date: 08/07/2018 CLINICAL DATA:  Fall at home.  Hyperglycemia.  Left facial bruising. EXAM: CT HEAD WITHOUT CONTRAST CT MAXILLOFACIAL WITHOUT CONTRAST CT CERVICAL SPINE WITHOUT CONTRAST TECHNIQUE:  Multidetector CT imaging of the head, cervical spine, and maxillofacial structures were performed using the standard protocol without intravenous contrast. Multiplanar CT image reconstructions of the cervical spine and maxillofacial structures were also  generated. COMPARISON:  Multiple exams, including 10/02/2017 FINDINGS: CT HEAD FINDINGS Brain: The brainstem, cerebellum, cerebral peduncles, thalami, basal ganglia, basilar cisterns, and ventricular system appear within normal limits. Periventricular white matter and corona radiata hypodensities favor chronic ischemic microvascular white matter disease. The previous low-density left frontal extra-axial fluid prominence is no longer present. Vascular: Unremarkable Skull: Unremarkable Other: Left lateral periorbital soft tissue swelling. CT MAXILLOFACIAL FINDINGS Osseous: No facial fractures identified. The patient has several likely mandibular dental cavities. Orbits: No intraorbital/postseptal abnormality. The globes appear intact. Sinuses: Unremarkable Soft tissues: Left periorbital soft tissue swelling/subcutaneous hematoma, especially lateral. There is potentially some soft tissue swelling along the left chin. CT CERVICAL SPINE FINDINGS Alignment: No vertebral subluxation is observed. Skull base and vertebrae: No cervical spine fracture identified. No acute bony findings. Soft tissues and spinal canal: Right common carotid atherosclerotic calcification. Disc levels:  No bony impingement identified. Upper chest: Unremarkable Other: No supplemental non-categorized findings. IMPRESSION: 1. Left periorbital soft tissue swelling/subcutaneous hematoma, without intraorbital extension and without facial fracture. 2. No acute intracranial findings. No acute cervical spine findings. 3. Periventricular white matter and corona radiata hypodensities favor chronic ischemic microvascular white matter disease. 4. Mild right common carotid artery atherosclerotic calcification. 5. Several mandibular dental cavities. Electronically Signed   By: Van Clines M.D.   On: 08/07/2018 15:55    IMPRESSION AND PLAN:   DKA in type 2 diabetes- uses an insulin pump at home, but catheter was found to be outside his body when he  arrived to the ED. -Will place patient on insulin drip per DKA protocol -BMPs every 4 hours -N.p.o. for now -IVFs -Check a1c  Sepsis due to unknown etiology- meeting sepsis criteria on admission with fever, tachycardia, tachypnea, leukocytosis, and lactic acidosis.  Chest x-ray negative.  UA without signs of infection. Neutrophils elevated on CBC. -Will place on broad-spectrum antibiotics for now  -Blood and urine cultures pending -Trend lactic acid -Will check for COVID, given sepsis of unknown etiology, fevers, elevated AST.  Acute left hip fracture- seen on CT pelvis. -Ortho consulted -Hold off on pharmacologic DVT prophylaxis for now pending possible surgery -Tylenol prn pain  Mild rhabdomyolysis- likely due to laying on the floor for a couple of hours. -IV fluids -Check CK in the morning  AKI- likely due to all of the above.  Creatinine 1.58.  Baseline 0.5. -IV fluids -Recheck creatinine in the morning  Altered mental status- likely related to sepsis and DKA.  AMS resolved on my exam.  CT head negative. -Monitor  Hyperglycemic hyponatremia- sodium corrects to 132. -Recheck sodium in the morning  Hypertension- blood pressures elevated in the ED -Continue home Norvasc and metoprolol -Hydralazine PRN  Parkinson's disease- follows with Dr. Manuella Ghazi as an outpatient. -Continue home Sinemet  DVT prophylaxis- SCDs  All the records are reviewed and case discussed with ED provider. Management plans discussed with the patient, family and they are in agreement.  CODE STATUS: Full  TOTAL TIME TAKING CARE OF THIS PATIENT: 45 minutes.    Berna Spare Seferino Oscar M.D on 08/07/2018 at 4:58 PM  Between 7am to 6pm - Pager - [phone]  After 6pm go to [email or web address] - Proofreader  [employer] [place] [facility]  Office  [phone]  CC: Primary care physician; Glendon Axe, MD   Note:  This dictation was prepared with Dragon dictation along with smaller phrase  technology. Any transcriptional errors that result from this process are unintentional.

## 2018-08-07 NOTE — Progress Notes (Signed)
Family Meeting Note  Advance Directive:no  Today a meeting took place with the Patient and spouse (discussed with spouse over the phone).  Patient is able to participate.   The following clinical team members were present during this meeting:MD  The following were discussed:Patient's diagnosis: DKA, sepsis, Patient's progosis: Unable to determine and Goals for treatment: Full Code  Additional follow-up to be provided: prn  Time spent during discussion:20 minutes  Evette Doffing, MD

## 2018-08-07 NOTE — ED Notes (Signed)
Condom catheter placed on pt by this tech, Lonn [place] EDT and English as a second language teacher. All of pts linens changed.

## 2018-08-07 NOTE — ED Provider Notes (Signed)
[facility] Emergency Department Provider Note  Time seen: 2:56 PM  I have reviewed the triage vital signs and the nursing notes.   HISTORY  Chief Complaint Fall and Hyperglycemia   HPI John Reilly is a 63 y.o. male with a past medical history of COPD, diabetes, Parkinson's, presents to the emergency department after a fall.  Per EMS the patient has Parkinson's at baseline, wife states she went to lay down and several hours later went to check on the patient and found him on the ground.  Believes that he must have fallen.  On arrival to the emergency department the patient is awake, but he is extremely confused unable to answer any questions or follow any commands.  We are unsure of the patient's baseline.  Patient is not speaking at this time, he is awake and will look at you when you talk but is not following commands or answering questions.  Patient has an insulin pump however it does not appear to be connected properly and patient's fingerstick blood glucose was 400.  Patient has apparent bruising and swelling to the left side of his face, left shoulder and somewhat to the left hip which appears somewhat deformed.  During patient's initial evaluation he was found to be febrile as well.   Past Medical History:  Diagnosis Date  . Cancer ([place])    Pt reports on 10/02/17 that he has never been dx with cancer  . COPD (chronic obstructive pulmonary disease) ([place])   . Diabetes mellitus without complication ([place])   . GERD (gastroesophageal reflux disease)   . Hypertension   . Multiple duodenal ulcers   . Peripheral vascular disease ([facility])    Peripheral Neuropathy  . Retinopathy     Patient Active Problem List   Diagnosis Date Noted  . DKA (diabetic ketoacidoses) ([place]) 10/13/2017  . Malnutrition of moderate degree 10/04/2017  . Humeral fracture 10/03/2017  . Ulcer of toe ([place]) 04/27/2015  . Hyponatremia 03/15/2015    Past Surgical History:  Procedure  Laterality Date  . ANKLE ARTHROPLASTY     2013  . COLONOSCOPY WITH PROPOFOL N/A 10/12/2016   Procedure: COLONOSCOPY WITH PROPOFOL;  Surgeon: Manya Silvas, MD;  Location: [facility] ENDOSCOPY;  Service: Endoscopy;  Laterality: N/A;    Prior to Admission medications   Medication Sig Start Date End Date Taking? Authorizing Provider  acetaminophen (TYLENOL) 325 MG tablet Take 2 tablets (650 mg total) by mouth every 4 (four) hours as needed for moderate pain (or Fever >/= 101). 10/04/17   Max Sane, MD  amLODipine (NORVASC) 5 MG tablet Take 1 tablet by mouth daily as needed (BP OVER 160/90).  07/22/17   [provider]  B Complex-Biotin-FA (SUPER B-COMPLEX) TABS Take 1 tablet by mouth daily.    [provider]  carbidopa-levodopa (SINEMET CR) 50-200 MG tablet Take 1 tablet by mouth at bedtime. 07/22/17   [provider]  carbidopa-levodopa (SINEMET IR) 25-100 MG tablet Take 2 tablets by mouth 3 (three) times daily.  04/22/17 10/19/17  [provider]  cephALEXin (KEFLEX) 500 MG capsule Take 1 capsule (500 mg total) by mouth every 8 (eight) hours. 10/20/17   Loletha Grayer, MD  folic acid (FOLVITE) 1 MG tablet Take 1 mg by mouth daily.    [provider]  glucagon 1 MG injection 1 mg by Other route once as needed (for severe hypoglycemia).     [provider]  Insulin Glargine (LANTUS) 100 UNIT/ML Solostar Pen Inject  8 Units into the skin every morning. 10/20/17   Loletha Grayer, MD  insulin lispro (HUMALOG) 100 UNIT/ML cartridge Inject 0.02 mLs (2 Units total) into the skin 3 (three) times daily with meals. 10/20/17   Loletha Grayer, MD  metoprolol succinate (TOPROL-XL) 50 MG 24 hr tablet Take 1 tablet (50 mg total) by mouth daily. Take with or immediately following a meal. 10/20/17   Loletha Grayer, MD  Multiple Vitamin (MULTIVITAMIN WITH MINERALS) TABS tablet Take 1 tablet by mouth daily.    [provider]  omeprazole (PRILOSEC) 40 MG  capsule Take 40 mg by mouth daily.    [provider]  oxyCODONE-acetaminophen (PERCOCET/ROXICET) 5-325 MG tablet Take 1 tablet by mouth every 4 (four) hours as needed for moderate pain or severe pain. 10/20/17   Loletha Grayer, MD  thiamine 100 MG tablet Take 1 tablet (100 mg total) by mouth daily. 10/20/17   Loletha Grayer, MD    Allergies  Allergen Reactions  . Penicillins Rash    Has patient had a PCN reaction causing immediate rash, facial/tongue/throat swelling, SOB or lightheadedness with hypotension: Yes Has patient had a PCN reaction causing severe rash involving mucus membranes or skin necrosis: No Has patient had a PCN reaction that required hospitalization: No Has patient had a PCN reaction occurring within the last 10 years: No If all of the above answers are "NO", then may proceed with Cephalosporin use.    Family History  Problem Relation Age of Onset  . Stroke Mother   . Alzheimer's disease Father   . Diabetes Brother     Social History Social History   Tobacco Use  . Smoking status: Former Smoker    Last attempt to quit: 10/21/2005    Years since quitting: 12.8  . Smokeless tobacco: Never Used  Substance Use Topics  . Alcohol use: Yes    Alcohol/week: 42.0 standard drinks    Types: 42 Cans of beer per week    Comment: patient states he drinks 1 quart of beer per day  . Drug use: No    Review of Systems Unable to obtain adequate/accurate review of systems secondary to altered mental status  ____________________________________________   PHYSICAL EXAM:  Constitutional: Patient is awake, we will look at you when you are talking but appears extremely confused unable to answer any questions or follow any commands or give any history whatsoever. Eyes: Normal exam ENT   Head: Swelling to the left side of his face consistent with possible fall or prolonged downtime.   Mouth/Throat: Mucous membranes are moist.  Dried blood in his mouth.  There  is a missing left upper central incisor (no current bleeding unsure if this is acute versus chronic) Cardiovascular: Normal rate, regular rhythm Respiratory: Normal respiratory effort without tachypnea nor retractions. Breath sounds are clear.  No obvious wheeze rales or rhonchi. Gastrointestinal: Soft and nontender. No distention.  No reaction to abdominal palpation. Musculoskeletal: Patient has bruising to the left shoulder however is able to move both upper extremities with no apparent discomfort.  Good range of motion of the right lower extremity.  Patient does appear to have a deformity around the left hip, when I range the left hip he does not necessarily respond but will look at me when I do so.  Unclear if he is experiencing pain with range of motion.  Appears to be neurovascular intact distally. Neurologic: Patient is currently not speaking, will look at you make eye contact.  No obvious gross deficit but  unable to complete an adequate exam secondary to altered mental status. Skin:  Skin is warm Psychiatric: Appears quite confused.  ____________________________________________    EKG  EKG viewed and interpreted by myself appears to show sinus tachycardia at 124 bpm with a narrow QRS, normal axis, largely normal intervals as best I can tell, nonspecific but no obvious concerning ST abnormalities.  Electrical interference due to parkinsons tremor.  ____________________________________________    RADIOLOGY  Old left humerus fracture no acute abnormality. No acute findings on CT head, face, C-spine. Chest x-ray negative ____________________________________________   INITIAL IMPRESSION / ASSESSMENT AND PLAN / ED COURSE  Pertinent labs & imaging results that were available during my care of the patient were reviewed by me and considered in my medical decision making (see chart for details).  Patient presents to the emergency department after a reported fall.  However upon  evaluation patient has findings consistent with a fall onto his left side with a likely prolonged downtime.  Patient found to be hyperglycemic, insulin pump is not properly connected at this time.  Patient found to be febrile consistent with likely sepsis.  Given initial findings I am very concerned for possible DKA, sepsis, infectious etiology possible hip fracture.  We will check labs, including CK, VBG, blood cultures.  We will start on broad-spectrum antibiotics.  We will obtain x-ray imaging of the left shoulder chest and left hip.  We will obtain CT imaging of the head face and neck.  We will continue to closely monitor while awaiting these results.  I deafly anticipate admission to the hospital given his current presentation.  Patient's labs have begun to result.  Lactate is elevated at 3.1 which again consistent with likely sepsis, currently of unknown origin (urinalysis pending).  Patient has an anion gap of 25 and an elevated blood glucose with a pH of 7.28 consistent with DKA.  He will be started on an insulin infusion.  Patient also has an elevated CK of 1600 consistent with rhabdomyolysis patient will be started/continued on IV fluids.  CT scan is consistent with a left hip fracture.  I spoke to Dr. Roland Rack regarding the patient.   CRITICAL CARE Performed by: Harvest Dark   Total critical care time: 60 minutes  Critical care time was exclusive of separately billable procedures and treating other patients.  Critical care was necessary to treat or prevent imminent or life-threatening deterioration.  Critical care was time spent personally by me on the following activities: development of treatment plan with patient and/or surrogate as well as nursing, discussions with consultants, evaluation of patient's response to treatment, examination of patient, obtaining history from patient or surrogate, ordering and performing treatments and interventions, ordering and review of laboratory  studies, ordering and review of radiographic studies, pulse oximetry and re-evaluation of patient's condition.   ____________________________________________   FINAL CLINICAL IMPRESSION(S) / ED DIAGNOSES  Fall Left hip fracture DKA Rhabdomyolysis Sepsis   Harvest Dark, MD 08/07/18 1625

## 2018-08-07 NOTE — Consult Note (Signed)
Pharmacy Antibiotic Note  John Reilly is a 63 y.o. male admitted on 08/07/2018 with sepsis.  Pharmacy has been consulted for Vancomycin/Cefepime dosing.  Plan: Will dose Cefepime 2g q12h  Patient received 1500mg  loading dose IV Vancomycin,  Will dose Vancomycin 1250mg  mg IV Q 24 hrs. Goal AUC 400-550. Expected AUC: 463  SCr used: 1.58   Height: 6' (182.9 cm) Weight: 171 lb 15.3 oz (78 kg) IBW/kg (Calculated) : 77.6  Temp (24hrs), Avg:101.1 F (38.4 C), Min:101.1 F (38.4 C), Max:101.1 F (38.4 C)  Recent Labs  Lab 08/07/18 1506  WBC 15.3*  CREATININE 1.58*  LATICACIDVEN 3.1*    Estimated Creatinine Clearance: 52.5 mL/min (A) (by C-G formula based on SCr of 1.58 mg/dL (H)).    Allergies  Allergen Reactions  . Penicillins Rash    Has patient had a PCN reaction causing immediate rash, facial/tongue/throat swelling, SOB or lightheadedness with hypotension: Yes Has patient had a PCN reaction causing severe rash involving mucus membranes or skin necrosis: No Has patient had a PCN reaction that required hospitalization: No Has patient had a PCN reaction occurring within the last 10 years: No If all of the above answers are "NO", then may proceed with Cephalosporin use.    Antimicrobials this admission: 3/29 Aztreonam x 1  3/29 Cefepime >>  3/29 Vancomycin >>   Dose adjustments this admission: None  Microbiology results: 3/29 BCx: pending 3/29 UCx: pending   Thank you for allowing pharmacy to be a part of this patient's care.  Lu Duffel, PharmD, BCPS Clinical Pharmacist 08/07/2018 6:06 PM

## 2018-08-08 ENCOUNTER — Inpatient Hospital Stay: Payer: Medicare Other | Admitting: Anesthesiology

## 2018-08-08 ENCOUNTER — Inpatient Hospital Stay: Payer: Medicare Other

## 2018-08-08 ENCOUNTER — Encounter
Admission: EM | Disposition: A | Discharge status: Other Acute Inpatient Hospital | Payer: Self-pay | Source: Home / Self Care | Attending: Internal Medicine

## 2018-08-08 ENCOUNTER — Other Ambulatory Visit: Payer: Self-pay

## 2018-08-08 ENCOUNTER — Encounter: Payer: Self-pay | Admitting: Anesthesiology

## 2018-08-08 DIAGNOSIS — W19XXXA Unspecified fall, initial encounter: Secondary | ICD-10-CM

## 2018-08-08 DIAGNOSIS — S72002A Fracture of unspecified part of neck of left femur, initial encounter for closed fracture: Secondary | ICD-10-CM

## 2018-08-08 DIAGNOSIS — R4182 Altered mental status, unspecified: Secondary | ICD-10-CM

## 2018-08-08 HISTORY — PX: INTRAMEDULLARY (IM) NAIL INTERTROCHANTERIC: SHX5875

## 2018-08-08 LAB — GLUCOSE, CAPILLARY
GLUCOSE-CAPILLARY: 135 mg/dL — AB (ref 70–99)
Glucose-Capillary: 408 mg/dL — ABNORMAL HIGH (ref 70–99)
Glucose-Capillary: 165 mg/dL — ABNORMAL HIGH (ref 70–99)
Glucose-Capillary: 161 mg/dL — ABNORMAL HIGH (ref 70–99)
Glucose-Capillary: 191 mg/dL — ABNORMAL HIGH (ref 70–99)
Glucose-Capillary: 147 mg/dL — ABNORMAL HIGH (ref 70–99)
Glucose-Capillary: 153 mg/dL — ABNORMAL HIGH (ref 70–99)
Glucose-Capillary: 221 mg/dL — ABNORMAL HIGH (ref 70–99)

## 2018-08-08 LAB — CBC
HCT: 25.6 % — ABNORMAL LOW (ref 39.0–52.0)
Hemoglobin: 8.7 g/dL — ABNORMAL LOW (ref 13.0–17.0)
MCH: 31.4 pg (ref 26.0–34.0)
MCHC: 34 g/dL (ref 30.0–36.0)
MCV: 92.4 fL (ref 80.0–100.0)
NRBC: 0 % (ref 0.0–0.2)
Platelets: 339 10*3/uL (ref 150–400)
RBC: 2.77 MIL/uL — ABNORMAL LOW (ref 4.22–5.81)
RDW: 12.3 % (ref 11.5–15.5)
WBC: 14.8 10*3/uL — ABNORMAL HIGH (ref 4.0–10.5)

## 2018-08-08 LAB — BLOOD CULTURE ID PANEL (REFLEXED)
Acinetobacter baumannii: NOT DETECTED
Candida albicans: NOT DETECTED
Candida glabrata: NOT DETECTED
Candida krusei: NOT DETECTED
Candida parapsilosis: NOT DETECTED
Candida tropicalis: NOT DETECTED
ENTEROCOCCUS SPECIES: NOT DETECTED
Enterobacter cloacae complex: NOT DETECTED
Enterobacteriaceae species: NOT DETECTED
Escherichia coli: NOT DETECTED
Haemophilus influenzae: NOT DETECTED
Klebsiella oxytoca: NOT DETECTED
Klebsiella pneumoniae: NOT DETECTED
LISTERIA MONOCYTOGENES: NOT DETECTED
Methicillin resistance: DETECTED — AB
Neisseria meningitidis: NOT DETECTED
PROTEUS SPECIES: NOT DETECTED
PSEUDOMONAS AERUGINOSA: NOT DETECTED
SERRATIA MARCESCENS: NOT DETECTED
STAPHYLOCOCCUS AUREUS BCID: NOT DETECTED
STAPHYLOCOCCUS SPECIES: DETECTED — AB
STREPTOCOCCUS PNEUMONIAE: NOT DETECTED
Streptococcus agalactiae: NOT DETECTED
Streptococcus pyogenes: NOT DETECTED
Streptococcus species: NOT DETECTED

## 2018-08-08 LAB — BASIC METABOLIC PANEL
Anion gap: 9 (ref 5–15)
BUN: 25 mg/dL — ABNORMAL HIGH (ref 8–23)
CO2: 22 mmol/L (ref 22–32)
Calcium: 7.7 mg/dL — ABNORMAL LOW (ref 8.9–10.3)
Chloride: 95 mmol/L — ABNORMAL LOW (ref 98–111)
Creatinine, Ser: 1.39 mg/dL — ABNORMAL HIGH (ref 0.61–1.24)
GFR calc Af Amer: >60 mL/min (ref >60)
GFR calc non Af Amer: 54 mL/min — ABNORMAL LOW (ref >60)
Glucose, Bld: 123 mg/dL — ABNORMAL HIGH (ref 70–99)
Potassium: 4 mmol/L (ref 3.5–5.1)
Sodium: 126 mmol/L — ABNORMAL LOW (ref 135–145)

## 2018-08-08 LAB — RESPIRATORY PANEL BY PCR
Adenovirus: NOT DETECTED
Bordetella pertussis: NOT DETECTED
Chlamydophila pneumoniae: NOT DETECTED
Coronavirus 229E: NOT DETECTED
Coronavirus HKU1: NOT DETECTED
Coronavirus NL63: NOT DETECTED
Coronavirus OC43: NOT DETECTED
Influenza A: NOT DETECTED
Influenza B: NOT DETECTED
Metapneumovirus: NOT DETECTED
Mycoplasma pneumoniae: NOT DETECTED
PARAINFLUENZA VIRUS 4-RVPPCR: NOT DETECTED
Parainfluenza Virus 1: NOT DETECTED
Parainfluenza Virus 2: NOT DETECTED
Parainfluenza Virus 3: NOT DETECTED
RHINOVIRUS / ENTEROVIRUS - RVPPCR: NOT DETECTED
Respiratory Syncytial Virus: NOT DETECTED

## 2018-08-08 LAB — INFLUENZA PANEL BY PCR (TYPE A & B)
Influenza A By PCR: NEGATIVE
Influenza B By PCR: NEGATIVE

## 2018-08-08 LAB — CK: Total CK: 904 U/L — ABNORMAL HIGH (ref 49–397)

## 2018-08-08 LAB — PROCALCITONIN: Procalcitonin: 2.34 ng/mL

## 2018-08-08 SURGERY — FIXATION, FRACTURE, INTERTROCHANTERIC, WITH INTRAMEDULLARY ROD
Anesthesia: General | Site: Hip | Laterality: Left

## 2018-08-08 MED ORDER — MIDAZOLAM HCL 5 MG/5ML IJ SOLN
INTRAMUSCULAR | Status: DC | PRN
  Administered 2018-08-08: 1 mg via INTRAVENOUS

## 2018-08-08 MED ORDER — FENTANYL CITRATE (PF) 100 MCG/2ML IJ SOLN: 25.0000 ug | INTRAMUSCULAR | Status: DC | PRN

## 2018-08-08 MED ORDER — BUPIVACAINE-EPINEPHRINE (PF) 0.5% -1:200000 IJ SOLN
INTRAMUSCULAR | Status: DC | PRN
  Administered 2018-08-08: 30 mL via PERINEURAL

## 2018-08-08 MED ORDER — FENTANYL CITRATE (PF) 100 MCG/2ML IJ SOLN
INTRAMUSCULAR | Status: DC | PRN
  Administered 2018-08-08 (×2): 50 ug via INTRAVENOUS

## 2018-08-08 MED ORDER — HYDROMORPHONE HCL 1 MG/ML IJ SOLN: 0.2500 mg | INTRAMUSCULAR | Status: DC | PRN

## 2018-08-08 MED ORDER — MIDAZOLAM HCL 2 MG/2ML IJ SOLN
INTRAMUSCULAR | Status: AC
  Filled 2018-08-08: qty 2

## 2018-08-08 MED ORDER — ACETAMINOPHEN 325 MG PO TABS: 325.0000 mg | ORAL_TABLET | Freq: Four times a day (QID) | ORAL | Status: DC | PRN

## 2018-08-08 MED ORDER — BUPIVACAINE HCL (PF) 0.5 % IJ SOLN
INTRAMUSCULAR | Status: AC
  Filled 2018-08-08: qty 30

## 2018-08-08 MED ORDER — PROPOFOL 10 MG/ML IV BOLUS
INTRAVENOUS | Status: AC
  Filled 2018-08-08: qty 20

## 2018-08-08 MED ORDER — BISACODYL 10 MG RE SUPP: 10.0000 mg | Freq: Every day | RECTAL | Status: DC | PRN

## 2018-08-08 MED ORDER — SODIUM CHLORIDE 0.9 % IV SOLN
1.0000 g | INTRAVENOUS | Status: DC
  Administered 2018-08-08: 1 g via INTRAVENOUS
  Filled 2018-08-08: qty 1
  Filled 2018-08-08: qty 10

## 2018-08-08 MED ORDER — METOCLOPRAMIDE HCL 10 MG PO TABS
5.0000 mg | ORAL_TABLET | Freq: Three times a day (TID) | ORAL | Status: DC | PRN
  Filled 2018-08-08: qty 1

## 2018-08-08 MED ORDER — ONDANSETRON HCL 4 MG/2ML IJ SOLN
INTRAMUSCULAR | Status: AC
  Filled 2018-08-08: qty 2

## 2018-08-08 MED ORDER — ROCURONIUM BROMIDE 100 MG/10ML IV SOLN
INTRAVENOUS | Status: DC | PRN
  Administered 2018-08-08 (×2): 10 mg via INTRAVENOUS
  Administered 2018-08-08: 20 mg via INTRAVENOUS

## 2018-08-08 MED ORDER — SODIUM CHLORIDE 0.9 % IV SOLN
INTRAVENOUS | Status: DC | PRN
  Administered 2018-08-08: 16:00:00 via INTRAVENOUS

## 2018-08-08 MED ORDER — METOCLOPRAMIDE HCL 5 MG/ML IJ SOLN: 5.0000 mg | Freq: Three times a day (TID) | INTRAMUSCULAR | Status: DC | PRN

## 2018-08-08 MED ORDER — DOCUSATE SODIUM 100 MG PO CAPS
100.0000 mg | ORAL_CAPSULE | Freq: Two times a day (BID) | ORAL | Status: DC
  Administered 2018-08-08 – 2018-08-10 (×4): 100 mg via ORAL
  Filled 2018-08-08 (×4): qty 1

## 2018-08-08 MED ORDER — SUCCINYLCHOLINE CHLORIDE 20 MG/ML IJ SOLN
INTRAMUSCULAR | Status: AC
  Filled 2018-08-08: qty 1

## 2018-08-08 MED ORDER — SUCCINYLCHOLINE CHLORIDE 20 MG/ML IJ SOLN
INTRAMUSCULAR | Status: DC | PRN
  Administered 2018-08-08: 160 mg via INTRAVENOUS

## 2018-08-08 MED ORDER — LIDOCAINE HCL (CARDIAC) PF 100 MG/5ML IV SOSY
PREFILLED_SYRINGE | INTRAVENOUS | Status: DC | PRN
  Administered 2018-08-08: 60 mg via INTRAVENOUS

## 2018-08-08 MED ORDER — ONDANSETRON HCL 4 MG/2ML IJ SOLN
INTRAMUSCULAR | Status: DC | PRN
  Administered 2018-08-08: 4 mg via INTRAVENOUS

## 2018-08-08 MED ORDER — FENTANYL CITRATE (PF) 100 MCG/2ML IJ SOLN
INTRAMUSCULAR | Status: AC
  Filled 2018-08-08: qty 2

## 2018-08-08 MED ORDER — ONDANSETRON HCL 4 MG/2ML IJ SOLN: 4.0000 mg | Freq: Four times a day (QID) | INTRAMUSCULAR | Status: DC | PRN

## 2018-08-08 MED ORDER — FLEET ENEMA 7-19 GM/118ML RE ENEM: 1.0000 | ENEMA | Freq: Once | RECTAL | Status: DC | PRN

## 2018-08-08 MED ORDER — TRAMADOL HCL 50 MG PO TABS
50.0000 mg | ORAL_TABLET | Freq: Four times a day (QID) | ORAL | Status: DC | PRN
  Administered 2018-08-08: 50 mg via ORAL
  Filled 2018-08-08 (×2): qty 1

## 2018-08-08 MED ORDER — PROPOFOL 10 MG/ML IV BOLUS
INTRAVENOUS | Status: DC | PRN
  Administered 2018-08-08: 150 mg via INTRAVENOUS
  Administered 2018-08-08: 40 mg via INTRAVENOUS

## 2018-08-08 MED ORDER — ONDANSETRON HCL 4 MG PO TABS
4.0000 mg | ORAL_TABLET | Freq: Four times a day (QID) | ORAL | Status: DC | PRN
  Administered 2018-08-10: 4 mg via ORAL
  Filled 2018-08-08: qty 1

## 2018-08-08 MED ORDER — ROCURONIUM BROMIDE 50 MG/5ML IV SOLN
INTRAVENOUS | Status: AC
  Filled 2018-08-08: qty 1

## 2018-08-08 MED ORDER — SUGAMMADEX SODIUM 200 MG/2ML IV SOLN
INTRAVENOUS | Status: DC | PRN
  Administered 2018-08-08: 180 mg via INTRAVENOUS

## 2018-08-08 MED ORDER — DIPHENHYDRAMINE HCL 12.5 MG/5ML PO ELIX
12.5000 mg | ORAL_SOLUTION | ORAL | Status: DC | PRN
  Filled 2018-08-08: qty 10

## 2018-08-08 MED ORDER — PHENYLEPHRINE HCL 10 MG/ML IJ SOLN
INTRAMUSCULAR | Status: DC | PRN
  Administered 2018-08-08: 100 ug via INTRAVENOUS
  Administered 2018-08-08: 150 ug via INTRAVENOUS
  Administered 2018-08-08 (×2): 100 ug via INTRAVENOUS
  Administered 2018-08-08: 150 ug via INTRAVENOUS

## 2018-08-08 MED ORDER — DEXAMETHASONE SODIUM PHOSPHATE 10 MG/ML IJ SOLN
INTRAMUSCULAR | Status: AC
  Filled 2018-08-08: qty 1

## 2018-08-08 MED ORDER — ACETAMINOPHEN 500 MG PO TABS
1000.0000 mg | ORAL_TABLET | Freq: Four times a day (QID) | ORAL | Status: AC
  Administered 2018-08-09 (×3): 1000 mg via ORAL
  Filled 2018-08-08 (×3): qty 2

## 2018-08-08 MED ORDER — SODIUM CHLORIDE 0.9 % IV SOLN
INTRAVENOUS | Status: DC
  Administered 2018-08-08: 21:00:00 via INTRAVENOUS

## 2018-08-08 MED ORDER — ONDANSETRON HCL 4 MG/2ML IJ SOLN: 4.0000 mg | Freq: Once | INTRAMUSCULAR | Status: DC | PRN

## 2018-08-08 MED ORDER — OXYCODONE HCL 5 MG PO TABS
5.0000 mg | ORAL_TABLET | ORAL | Status: DC | PRN
  Administered 2018-08-08: 5 mg via ORAL
  Administered 2018-08-09 – 2018-08-10 (×6): 10 mg via ORAL
  Filled 2018-08-08 (×7): qty 2
  Filled 2018-08-08: qty 1
  Filled 2018-08-08: qty 2

## 2018-08-08 MED ORDER — MAGNESIUM HYDROXIDE 400 MG/5ML PO SUSP: 30.0000 mL | Freq: Every day | ORAL | Status: DC | PRN

## 2018-08-08 MED ORDER — ACETAMINOPHEN 10 MG/ML IV SOLN
INTRAVENOUS | Status: DC | PRN
  Administered 2018-08-08: 1000 mg via INTRAVENOUS

## 2018-08-08 MED ORDER — HEPARIN SODIUM (PORCINE) 5000 UNIT/ML IJ SOLN
5000.0000 [IU] | Freq: Three times a day (TID) | INTRAMUSCULAR | Status: DC
  Administered 2018-08-08 – 2018-08-10 (×5): 5000 [IU] via SUBCUTANEOUS
  Filled 2018-08-08 (×5): qty 1

## 2018-08-08 SURGICAL SUPPLY — 42 items
BIT DRILL 4.3MMS DISTAL GRDTED (BIT) ×1 IMPLANT
BNDG COHESIVE 4X5 TAN STRL (GAUZE/BANDAGES/DRESSINGS) ×3 IMPLANT
BNDG COHESIVE 6X5 TAN STRL LF (GAUZE/BANDAGES/DRESSINGS) ×3 IMPLANT
CANISTER SUCT 1200ML W/VALVE (MISCELLANEOUS) ×3 IMPLANT
CHLORAPREP W/TINT 26 (MISCELLANEOUS) ×6 IMPLANT
CORTICAL BONE SCR 5.0MM X 46MM (Screw) ×3 IMPLANT
COVER WAND RF STERILE (DRAPES) ×3 IMPLANT
DRAPE C-ARMOR (DRAPES) ×3 IMPLANT
DRAPE SHEET LG 3/4 BI-LAMINATE (DRAPES) ×3 IMPLANT
DRILL 4.3MMS DISTAL GRADUATED (BIT) ×3
DRSG OPSITE POSTOP 3X4 (GAUZE/BANDAGES/DRESSINGS) ×3 IMPLANT
DRSG OPSITE POSTOP 4X6 (GAUZE/BANDAGES/DRESSINGS) ×3 IMPLANT
ELECT CAUTERY BLADE 6.4 (BLADE) ×3 IMPLANT
ELECT REM PT RETURN 9FT ADLT (ELECTROSURGICAL) ×3
ELECTRODE REM PT RTRN 9FT ADLT (ELECTROSURGICAL) ×1 IMPLANT
GAUZE SPONGE 4X4 12PLY STRL (GAUZE/BANDAGES/DRESSINGS) ×3 IMPLANT
GLOVE BIO SURGEON STRL SZ8 (GLOVE) ×6 IMPLANT
GLOVE INDICATOR 8.0 STRL GRN (GLOVE) ×3 IMPLANT
GOWN STRL REUS W/ TWL LRG LVL3 (GOWN DISPOSABLE) ×1 IMPLANT
GOWN STRL REUS W/ TWL XL LVL3 (GOWN DISPOSABLE) ×1 IMPLANT
GOWN STRL REUS W/TWL LRG LVL3 (GOWN DISPOSABLE) ×2
GOWN STRL REUS W/TWL XL LVL3 (GOWN DISPOSABLE) ×2
GUIDEPIN VERSANAIL DSP 3.2X444 (ORTHOPEDIC DISPOSABLE SUPPLIES) ×3 IMPLANT
GUIDEWIRE BALL NOSE 80CM (WIRE) ×3 IMPLANT
MAT ABSORB  FLUID 56X50 GRAY (MISCELLANEOUS) ×2
MAT ABSORB FLUID 56X50 GRAY (MISCELLANEOUS) ×1 IMPLANT
NAIL HIP FRACT LT 130D 11X400 (Nail) ×3 IMPLANT
NEEDLE FILTER BLUNT 18X 1/2SAF (NEEDLE) ×2
NEEDLE FILTER BLUNT 18X1 1/2 (NEEDLE) ×1 IMPLANT
NEEDLE HYPO 22GX1.5 SAFETY (NEEDLE) ×3 IMPLANT
NS IRRIG 500ML POUR BTL (IV SOLUTION) ×3 IMPLANT
PACK HIP COMPR (MISCELLANEOUS) ×3 IMPLANT
SCREW CORTICL BON 5.0MM X 46MM (Screw) ×1 IMPLANT
SCREW LAG 10.5MMX105MM HFN (Screw) ×3 IMPLANT
STAPLER SKIN PROX 35W (STAPLE) ×3 IMPLANT
STRAP SAFETY 5IN WIDE (MISCELLANEOUS) ×3 IMPLANT
SUT VIC AB 0 CT1 36 (SUTURE) ×3 IMPLANT
SUT VIC AB 1 CT1 36 (SUTURE) ×3 IMPLANT
SUT VIC AB 2-0 CT1 (SUTURE) ×6 IMPLANT
SYR 10ML LL (SYRINGE) ×3 IMPLANT
SYR 30ML LL (SYRINGE) ×3 IMPLANT
TAPE MICROFOAM 4IN (TAPE) ×3 IMPLANT

## 2018-08-08 NOTE — Progress Notes (Signed)
Pt back from OR

## 2018-08-08 NOTE — Anesthesia Procedure Notes (Signed)
Procedure Name: Intubation Date/Time: 08/08/2018 4:01 PM Performed by: Dionne Bucy, CRNA Pre-anesthesia Checklist: Patient identified, Patient being monitored, Timeout performed, Emergency Drugs available and Suction available Patient Re-evaluated:Patient Re-evaluated prior to induction Oxygen Delivery Method: Circle system utilized Preoxygenation: Pre-oxygenation with 100% oxygen Induction Type: IV induction Ventilation: Mask ventilation without difficulty Laryngoscope Size: Mac and 4 Grade View: Grade I Tube type: Oral Tube size: 7.0 mm Number of attempts: 1 Airway Equipment and Method: Stylet Placement Confirmation: ETT inserted through vocal cords under direct vision,  positive ETCO2 and breath sounds checked- equal and bilateral Secured at: 23 cm Tube secured with: Tape Dental Injury: Teeth and Oropharynx as per pre-operative assessment

## 2018-08-08 NOTE — Anesthesia Preprocedure Evaluation (Addendum)
Anesthesia Evaluation  Patient identified by MRN, date of birth, ID band Patient awake    Reviewed: Allergy & Precautions, NPO status , Patient's Chart, lab work & pertinent test results, reviewed documented beta blocker date and time   History of Anesthesia Complications Negative for: history of anesthetic complications  Airway Mallampati: II       Dental  (+) Partial Upper, Caps   Pulmonary COPD (pt denies), former smoker,           Cardiovascular hypertension, Pt. on medications and Pt. on home beta blockers + Peripheral Vascular Disease       Neuro/Psych  Neuromuscular disease    GI/Hepatic GERD  Medicated and Controlled,(+)     substance abuse  alcohol use,   Endo/Other  diabetes, Type 1, Insulin Dependent  Renal/GU      Musculoskeletal   Abdominal   Peds  Hematology   Anesthesia Other Findings Past Medical History: No date: Cancer [facility])     Comment:  Pt reports on 10/02/17 that he has never been dx with               cancer No date: COPD (chronic obstructive pulmonary disease) (HCC) No date: Diabetes mellitus without complication ([facility]CC) No date: GERD (gastroesophageal reflux disease) No date: Hypertension No date: Multiple duodenal ulcers No date: Peripheral vascular disease (HCC)     Comment:  Peripheral Neuropathy No date: Retinopathy   Reproductive/Obstetrics                            Anesthesia Physical  Anesthesia Plan  ASA: III  Anesthesia Plan: General   Post-op Pain Management:    Induction: Intravenous  PONV Risk Score and Plan:   Airway Management Planned: Oral ETT  Additional Equipment:   Intra-op Plan:   Post-operative Plan: Extubation in OR  Informed Consent: I have reviewed the patients History and Physical, chart, labs and discussed the procedure including the risks, benefits and alternatives for the proposed anesthesia with the patient or  authorized representative who has indicated his/her understanding and acceptance.       Plan Discussed with:   Anesthesia Plan Comments:         Anesthesia Quick Evaluation

## 2018-08-08 NOTE — Progress Notes (Signed)
npatient Diabetes Program Recommendations  AACE/ADA: New Consensus Statement on Inpatient Glycemic Control  Target Ranges:  Prepandial:   less than 140 mg/dL      Peak postprandial:   less than 180 mg/dL (1-2 hours)      Critically ill patients:  140 - 180 mg/dL  Results for CHRISTOPHR, CALIX (MRN [id number]) as of 08/08/2018 10:51  Ref. Range 08/08/2018 00:50 08/08/2018 01:57 08/08/2018 04:44 08/08/2018 08:21  Glucose-Capillary Latest Ref Range: 70 - 99 mg/dL 135 (H) 165 (H) 161 (H) 191 (H)   Results for SKY, BORBOA (MRN [id number]) as of 08/08/2018 10:51  Ref. Range 08/07/2018 14:42 08/07/2018 16:37 08/07/2018 17:53 08/07/2018 18:44 08/07/2018 20:09 08/07/2018 21:05 08/07/2018 22:38 08/07/2018 23:40  Glucose-Capillary Latest Ref Range: 70 - 99 mg/dL 452 (H) 497 (H) 435 (H) 408 (H) 282 (H) 227 (H) 150 (H) 148 (H)   Review of Glycemic Control  Diabetes history: DM1 (makes NO insulin; requires basal, correction, and mal coverage insulin) Outpatient Diabetes medications: Medtronic 670G insulin pump with Humalog Current orders for Inpatient glycemic control: Levemir 10 units QHS, Novolog 0-24 units Q24H  Inpatient Diabetes Program Recommendations:   Insulin - Basal: Noted patient received Levemir 10 units at 00:03 on 08/08/18 at time of transition off IV insulin to SQ insulin.  Correction (SSI): Please decrease Novolog correction scale to Novolog 0-9 units Q4H as patient has Type 1 DM and is very sensitive to insulin.  Insulin - Meal Coverage: Once diet is ordered, please consider ordering Novlog 2 units TID with meals for meal coverage if patient eats at least 50% of meals.  NOTE: In reviewing chart, noted patient has Type 1 DM and is followed by Dr. Honor Junes (Endocrinologist) and was last seen on 02/28/2018.  Patient uses an insulin pump for outpatient DM control. Noted patient presented to the hospital in DKA and per ED triage note on 08/07/18 at 2:44 pm, "Pt with insulin pump with the cath pulled out of  abd on arrival. Pump removed and turned off."   Patient was started on IV insulin drip and has been transition to SQ insulin regimen. Insulin pump settings should be as follows based on last office note by Dr. Honor Junes on 02/28/18:  Basal insulin  12A 0.625 units/hour 3A 0.55 units/hour 4P 0.725 units/hour 7P 0.725 units/hour Total daily basal insulin: 14.825 units/24 hours  Carb Coverage 12A  1:18 1 unit for every 18 grams of carbohydrates 6A  1:17 1 unit for every 17 grams of carbohydrates 630P  1:18 1 unit for every 18 grams of carbohydrates  Insulin Sensitivity 12A 1:60 1 unit drops blood glucose 60 mg/dl 2P 1:50 1 unit drops blood glucose 50 mg/dl  Target Glucose Goals 100-130 mg/dl  Active Insulin: 4 hours  Thanks, John Alderman, RN, MSN, CDE Diabetes Coordinator Inpatient Diabetes Program [phone] (Team Pager from 8am to 5pm)

## 2018-08-08 NOTE — Progress Notes (Signed)
CRITICAL CARE NOTE       SUBJECTIVE FINDINGS & SIGNIFICANT EVENTS    L hip fx - for repair today. Clinically improved.  Narrowing abx to rocephin only from cefepime/vanco.   PAST MEDICAL HISTORY   Past Medical History:  Diagnosis Date   Cancer ([place])    Pt reports on 10/02/17 that he has never been dx with cancer   COPD (chronic obstructive pulmonary disease) ([place])    Diabetes mellitus without complication ([facility])    GERD (gastroesophageal reflux disease)    Hypertension    Multiple duodenal ulcers    Peripheral vascular disease ([place])    Peripheral Neuropathy   Retinopathy      SURGICAL HISTORY   Past Surgical History:  Procedure Laterality Date   ANKLE ARTHROPLASTY     2013   COLONOSCOPY WITH PROPOFOL N/A 10/12/2016   Procedure: COLONOSCOPY WITH PROPOFOL;  Surgeon: Manya Silvas, MD;  Location: [facility] ENDOSCOPY;  Service: Endoscopy;  Laterality: N/A;     FAMILY HISTORY   Family History  Problem Relation Age of Onset   Stroke Mother    Alzheimer's disease Father    Diabetes Brother      SOCIAL HISTORY   Social History   Tobacco Use   Smoking status: Former Smoker    Last attempt to quit: 10/21/2005    Years since quitting: 12.8   Smokeless tobacco: Never Used  Substance Use Topics   Alcohol use: Yes    Alcohol/week: 42.0 standard drinks    Types: 42 Cans of beer per week    Comment: patient states he drinks 1 quart of beer per day   Drug use: No     MEDICATIONS   Current Medication:  Current Facility-Administered Medications:    amLODipine (NORVASC) tablet 5 mg, 5 mg, Oral, Daily PRN, Mayo, Pete Pelt, MD   B-complex with vitamin C tablet 1 tablet, 1 tablet, Oral, Daily, Mayo, Pete Pelt, MD, 1 tablet at 08/08/18 7939   carbidopa-levodopa (SINEMET CR)  50-200 MG per tablet controlled release 1 tablet, 1 tablet, Oral, QHS, Mayo, Pete Pelt, MD, 1 tablet at 08/07/18 2136   carbidopa-levodopa (SINEMET IR) 25-250 MG per tablet immediate release 1 tablet, 1 tablet, Oral, TID, Mayo, Pete Pelt, MD, 1 tablet at 08/08/18 0300   ceFEPIme (MAXIPIME) 2 g in sodium chloride 0.9 % 100 mL IVPB, 2 g, Intravenous, Q12H, Shanlever, Pierce Crane, RPH, Last Rate: 200 mL/hr at 08/08/18 0825, 2 g at 08/08/18 0825   clindamycin (CLEOCIN) IVPB 600 mg, 600 mg, Intravenous, Once, Mayo, Pete Pelt, MD   heparin injection 5,000 Units, 5,000 Units, Subcutaneous, Q8H, Tukov-Yual, Magdalene S, NP   hydrALAZINE (APRESOLINE) injection 5 mg, 5 mg, Intravenous, Q4H PRN, Mayo, Pete Pelt, MD   CBG monitoring, , , Q4H **AND** insulin aspart (novoLOG) injection 0-24 Units, 0-24 Units, Subcutaneous, Q4H, Aventura, Emily T, MD, 4 Units at 08/08/18 0823   insulin detemir (LEVEMIR) injection 10 Units, 10 Units, Subcutaneous, QHS, Aventura, Emily T, MD, 10 Units at 08/08/18 0003   metoprolol succinate (TOPROL-XL) 24 hr tablet 50 mg, 50 mg, Oral, Daily, Mayo, Pete Pelt, MD, 50 mg at 08/08/18 9233   multivitamin with minerals tablet 1 tablet, 1 tablet, Oral, Daily, Mayo, Pete Pelt, MD, 1 tablet at 08/08/18 0076   oxyCODONE-acetaminophen (PERCOCET/ROXICET) 5-325 MG per tablet 1 tablet, 1 tablet, Oral, Q4H PRN, Mayo, Pete Pelt, MD   pantoprazole (PROTONIX) EC tablet 80 mg, 80 mg, Oral, Daily, Mayo, Pete Pelt, MD, 80 mg  at 08/08/18 9528   thiamine (VITAMIN B-1) tablet 100 mg, 100 mg, Oral, Daily, Mayo, Pete Pelt, MD, 100 mg at 08/08/18 4132   vancomycin (VANCOCIN) 1,250 mg in sodium chloride 0.9 % 250 mL IVPB, 1,250 mg, Intravenous, Q24H, Shanlever, Pierce Crane, RPH    ALLERGIES   Penicillins    REVIEW OF SYSTEMS    10 system ROS neg except as per subj findings.  PHYSICAL EXAMINATION   Vitals:   08/08/18 0800 08/08/18 0900  BP: (!) 157/74 137/71  Pulse: 76 84  Resp: 18 17    Temp: (!) 97.2 F (36.2 C)   SpO2: 100% 99%    GENERAL:NAD, depressed appearing HEAD: Normocephalic, atraumatic.  EYES: Pupils equal, round, reactive to light.  No scleral icterus.  MOUTH: Moist mucosal membrane. NECK: Supple. No thyromegaly. No nodules. No JVD.  PULMONARY: unable to auscultate CARDIOVASCULAR: normal SR   GASTROINTESTINAL:, non-distended. MUSCULOSKELETAL: No swelling, clubbing, or edema.  NEUROLOGIC: Mild distress due to acute illness SKIN:intact,warm,dry   LABS AND IMAGING     -I personally reviewed most recent blood work, imaging and microbiology - significant findings today are pseudohyponatremia, metabolic acidosis improved, AKI stage 2 , hyperglycemia, dilutional anemia, leukocytosis  LAB RESULTS: Recent Labs  Lab 08/07/18 1928 08/07/18 2202 08/08/18 0206  NA 124* 126* 126*  K 4.2 3.9 4.0  CL 91* 94* 95*  CO2 16* 20* 22  BUN 25* 26* 25*  CREATININE 1.77* 1.52* 1.39*  GLUCOSE 340* 179* 123*   Recent Labs  Lab 08/07/18 1506 08/08/18 0555  HGB 10.9* 8.7*  HCT 32.9* 25.6*  WBC 15.3* 14.8*  PLT 423* 339     IMAGING RESULTS: Dg Chest 1 View  Result Date: 08/07/2018 CLINICAL DATA:  Fall. EXAM: CHEST  1 VIEW COMPARISON:  Radiograph October 16, 2017. FINDINGS: The heart size and mediastinal contours are within normal limits. Both lungs are clear. No pneumothorax or pleural effusion is noted. Old left rib fractures are noted. IMPRESSION: No active disease. Electronically Signed   By: Marijo Conception, M.D.   On: 08/07/2018 15:51   Ct Head Wo Contrast  Result Date: 08/07/2018 CLINICAL DATA:  Fall at home.  Hyperglycemia.  Left facial bruising. EXAM: CT HEAD WITHOUT CONTRAST CT MAXILLOFACIAL WITHOUT CONTRAST CT CERVICAL SPINE WITHOUT CONTRAST TECHNIQUE: Multidetector CT imaging of the head, cervical spine, and maxillofacial structures were performed using the standard protocol without intravenous contrast. Multiplanar CT image reconstructions of the  cervical spine and maxillofacial structures were also generated. COMPARISON:  Multiple exams, including 10/02/2017 FINDINGS: CT HEAD FINDINGS Brain: The brainstem, cerebellum, cerebral peduncles, thalami, basal ganglia, basilar cisterns, and ventricular system appear within normal limits. Periventricular white matter and corona radiata hypodensities favor chronic ischemic microvascular white matter disease. The previous low-density left frontal extra-axial fluid prominence is no longer present. Vascular: Unremarkable Skull: Unremarkable Other: Left lateral periorbital soft tissue swelling. CT MAXILLOFACIAL FINDINGS Osseous: No facial fractures identified. The patient has several likely mandibular dental cavities. Orbits: No intraorbital/postseptal abnormality. The globes appear intact. Sinuses: Unremarkable Soft tissues: Left periorbital soft tissue swelling/subcutaneous hematoma, especially lateral. There is potentially some soft tissue swelling along the left chin. CT CERVICAL SPINE FINDINGS Alignment: No vertebral subluxation is observed. Skull base and vertebrae: No cervical spine fracture identified. No acute bony findings. Soft tissues and spinal canal: Right common carotid atherosclerotic calcification. Disc levels:  No bony impingement identified. Upper chest: Unremarkable Other: No supplemental non-categorized findings. IMPRESSION: 1. Left periorbital soft tissue swelling/subcutaneous hematoma, without intraorbital extension  and without facial fracture. 2. No acute intracranial findings. No acute cervical spine findings. 3. Periventricular white matter and corona radiata hypodensities favor chronic ischemic microvascular white matter disease. 4. Mild right common carotid artery atherosclerotic calcification. 5. Several mandibular dental cavities. Electronically Signed   By: Van Clines M.D.   On: 08/07/2018 15:55   Ct Cervical Spine Wo Contrast  Result Date: 08/07/2018 CLINICAL DATA:  Fall at  home.  Hyperglycemia.  Left facial bruising. EXAM: CT HEAD WITHOUT CONTRAST CT MAXILLOFACIAL WITHOUT CONTRAST CT CERVICAL SPINE WITHOUT CONTRAST TECHNIQUE: Multidetector CT imaging of the head, cervical spine, and maxillofacial structures were performed using the standard protocol without intravenous contrast. Multiplanar CT image reconstructions of the cervical spine and maxillofacial structures were also generated. COMPARISON:  Multiple exams, including 10/02/2017 FINDINGS: CT HEAD FINDINGS Brain: The brainstem, cerebellum, cerebral peduncles, thalami, basal ganglia, basilar cisterns, and ventricular system appear within normal limits. Periventricular white matter and corona radiata hypodensities favor chronic ischemic microvascular white matter disease. The previous low-density left frontal extra-axial fluid prominence is no longer present. Vascular: Unremarkable Skull: Unremarkable Other: Left lateral periorbital soft tissue swelling. CT MAXILLOFACIAL FINDINGS Osseous: No facial fractures identified. The patient has several likely mandibular dental cavities. Orbits: No intraorbital/postseptal abnormality. The globes appear intact. Sinuses: Unremarkable Soft tissues: Left periorbital soft tissue swelling/subcutaneous hematoma, especially lateral. There is potentially some soft tissue swelling along the left chin. CT CERVICAL SPINE FINDINGS Alignment: No vertebral subluxation is observed. Skull base and vertebrae: No cervical spine fracture identified. No acute bony findings. Soft tissues and spinal canal: Right common carotid atherosclerotic calcification. Disc levels:  No bony impingement identified. Upper chest: Unremarkable Other: No supplemental non-categorized findings. IMPRESSION: 1. Left periorbital soft tissue swelling/subcutaneous hematoma, without intraorbital extension and without facial fracture. 2. No acute intracranial findings. No acute cervical spine findings. 3. Periventricular white matter and  corona radiata hypodensities favor chronic ischemic microvascular white matter disease. 4. Mild right common carotid artery atherosclerotic calcification. 5. Several mandibular dental cavities. Electronically Signed   By: Van Clines M.D.   On: 08/07/2018 15:55   Ct Pelvis Wo Contrast  Result Date: 08/07/2018 CLINICAL DATA:  Fall.  Deformity of the left hip. EXAM: CT PELVIS WITHOUT CONTRAST TECHNIQUE: Multidetector CT imaging of the pelvis was performed following the standard protocol without intravenous contrast. COMPARISON:  None. FINDINGS: Urinary Tract:  Unremarkable Bowel:  Unremarkable Vascular/Lymphatic: Unremarkable Reproductive:  Unremarkable Other:  No supplemental non-categorized findings. Musculoskeletal: Comminuted intertrochanteric fracture of the left hip. Multiple lesser trochanter fragments and several greater trochanter fragments. Mild varus angulation. Remote L4 compression fracture. The patient's right hand was included in imaging incidentally. There is evidence of plate and screw fixator along the proximal phalanx of the thumb, what appears to be some callus formation at likely chronic deformity of the index finger metacarpal, correlate with patient history. IMPRESSION: 1. Acute comminuted intratrochanteric fracture of the left proximal femur. Mild Verus angulation. 2. Remote L4 compression fracture. 3. Old deformities in the right hand which was incidentally included in the imaging volume. Electronically Signed   By: Van Clines M.D.   On: 08/07/2018 16:12   Dg Shoulder Left  Result Date: 08/07/2018 CLINICAL DATA:  Fall. EXAM: LEFT SHOULDER - 2+ VIEW COMPARISON:  Radiographs of Oct 02, 2017. FINDINGS: Old displaced proximal left humeral head and neck fracture is noted. Heterotopic bone formation and callus is noted consistent with old fracture. Glenohumeral joint is not well visualized due to overlying fracture. Acromioclavicular joint appears normal. Old  left rib  fractures are noted. IMPRESSION: Old proximal left humeral fracture is noted. No definite acute abnormality is noted. Electronically Signed   By: Marijo Conception, M.D.   On: 08/07/2018 15:50   Ct Maxillofacial Wo Contrast  Result Date: 08/07/2018 CLINICAL DATA:  Fall at home.  Hyperglycemia.  Left facial bruising. EXAM: CT HEAD WITHOUT CONTRAST CT MAXILLOFACIAL WITHOUT CONTRAST CT CERVICAL SPINE WITHOUT CONTRAST TECHNIQUE: Multidetector CT imaging of the head, cervical spine, and maxillofacial structures were performed using the standard protocol without intravenous contrast. Multiplanar CT image reconstructions of the cervical spine and maxillofacial structures were also generated. COMPARISON:  Multiple exams, including 10/02/2017 FINDINGS: CT HEAD FINDINGS Brain: The brainstem, cerebellum, cerebral peduncles, thalami, basal ganglia, basilar cisterns, and ventricular system appear within normal limits. Periventricular white matter and corona radiata hypodensities favor chronic ischemic microvascular white matter disease. The previous low-density left frontal extra-axial fluid prominence is no longer present. Vascular: Unremarkable Skull: Unremarkable Other: Left lateral periorbital soft tissue swelling. CT MAXILLOFACIAL FINDINGS Osseous: No facial fractures identified. The patient has several likely mandibular dental cavities. Orbits: No intraorbital/postseptal abnormality. The globes appear intact. Sinuses: Unremarkable Soft tissues: Left periorbital soft tissue swelling/subcutaneous hematoma, especially lateral. There is potentially some soft tissue swelling along the left chin. CT CERVICAL SPINE FINDINGS Alignment: No vertebral subluxation is observed. Skull base and vertebrae: No cervical spine fracture identified. No acute bony findings. Soft tissues and spinal canal: Right common carotid atherosclerotic calcification. Disc levels:  No bony impingement identified. Upper chest: Unremarkable Other: No  supplemental non-categorized findings. IMPRESSION: 1. Left periorbital soft tissue swelling/subcutaneous hematoma, without intraorbital extension and without facial fracture. 2. No acute intracranial findings. No acute cervical spine findings. 3. Periventricular white matter and corona radiata hypodensities favor chronic ischemic microvascular white matter disease. 4. Mild right common carotid artery atherosclerotic calcification. 5. Several mandibular dental cavities. Electronically Signed   By: Van Clines M.D.   On: 08/07/2018 15:55      ASSESSMENT AND PLAN    -Multidisciplinary rounds held today  Acute left hip fracture    S/p orthopedic evaluation - for OR today  Diabetic ketoacidosis    - Follow phase I and II DKA protocol     -Resolved now   Renal Failure-most likely due to ATN -follow chem 7 -follow UO -continue Foley Catheter-assess need daily   Sepsis likely due to Klebsiella UTI     - on empiric vanc/cefepime- narrow to rocephin per pharmD - appreciate input -nasal MRSA + - blood cx + MRSE -likely contaminant -Procal elevated 2.34, lactate now normal initially >7 -follow ABG and LA -follow up cultures   ID -continue IV abx as prescibed -follow up cultures  GI/Nutrition GI PROPHYLAXIS as indicated DIET-->TF's as tolerated Constipation protocol as indicated  ENDO - ICU hypoglycemic\Hyperglycemia protocol -check FSBS per protocol   ELECTROLYTES -follow labs as needed -replace as needed -pharmacy consultation   DVT/GI PRX ordered -SCDs  TRANSFUSIONS AS NEEDED MONITOR FSBS ASSESS the need for LABS as needed   Critical care provider statement:    Critical care time (minutes):  38   Critical care time was exclusive of:  Separately billable procedures and treating other patients   Critical care was necessary to treat or prevent imminent or life-threatening deterioration of the following conditions:  Acute hip fracture, DKA, AKI , sepsis due to  klebsiella UTI, multiple comorbid conditions.    Critical care was time spent personally by me on the following activities:  Development of treatment  plan with patient or surrogate, discussions with consultants, evaluation of patient's response to treatment, examination of patient, obtaining history from patient or surrogate, ordering and performing treatments and interventions, ordering and review of laboratory studies and re-evaluation of patient's condition.  I assumed direction of critical care for this patient from another provider in my specialty: no    This document was prepared using Dragon voice recognition software and may include unintentional dictation errors.    Ottie Glazier, M.D.  Division of [facility]

## 2018-08-08 NOTE — Progress Notes (Signed)
Pt to OR.

## 2018-08-08 NOTE — Transfer of Care (Signed)
Immediate Anesthesia Transfer of Care Note  Patient: John Reilly  Procedure(s) Performed: INTRAMEDULLARY (IM) NAIL INTERTROCHANTRIC (Left Hip)  Patient Location: PACU and ICU  Anesthesia Type:General  Level of Consciousness: awake, alert  and oriented  Airway & Oxygen Therapy: Patient Spontanous Breathing and Patient connected to face mask oxygen  Post-op Assessment: Report given to RN and Post -op Vital signs reviewed and stable  Post vital signs: Reviewed and stable  Last Vitals:  Vitals Value Taken Time  BP 161/68 08/08/2018  Temp    Pulse 89   Resp 16   SpO2 100%     Last Pain:  Vitals:   08/08/18 1400  TempSrc:   PainSc: 0-No pain         Complications: No apparent anesthesia complications

## 2018-08-08 NOTE — Consult Note (Signed)
ORTHOPAEDIC CONSULTATION  REQUESTING PHYSICIAN: Vaughan Basta, *  Chief Complaint:   Left hip pain.  History of Present Illness: John Reilly is a 63 y.o. male with a history of peripheral vascular disease, hypertension, diabetes, COPD, and Parkinson's disease who lives at home with his wife apparently began having difficulties with elevated blood sugars 2 days ago.  He complained of weakness and actually fell once while trying to get to the couch where he usually sleeps at night.  He took his insulin and went to bed.  The next morning he seemed to be doing better, but by early afternoon, he was found on the floor and was confused.  He was brought to the emergency room where he was noted to have a fever with elevated white count, tachycardia, and tachypnea, consistent with sepsis.  A CT scan of his pelvis also demonstrated an intertrochanteric fracture of his left hip.  The patient has been admitted for medical stabilization, as well as for stabilization of his left hip fracture.  The patient does not recall falling, and therefore cannot comment as to whether he had any lightheadedness, dizziness, chest pain, shortness of breath, or other symptoms which may have precipitated his fall.  Past Medical History:  Diagnosis Date  . Cancer ([place])    Pt reports on 10/02/17 that he has never been dx with cancer  . COPD (chronic obstructive pulmonary disease) ([place])   . Diabetes mellitus without complication ([place])   . GERD (gastroesophageal reflux disease)   . Hypertension   . Multiple duodenal ulcers   . Peripheral vascular disease ([facility])    Peripheral Neuropathy  . Retinopathy    Past Surgical History:  Procedure Laterality Date  . ANKLE ARTHROPLASTY     2013  . COLONOSCOPY WITH PROPOFOL N/A 10/12/2016   Procedure: COLONOSCOPY WITH PROPOFOL;  Surgeon: Manya Silvas, MD;  Location: [facility] ENDOSCOPY;  Service: Endoscopy;   Laterality: N/A;   Social History   Socioeconomic History  . Marital status: Married    Spouse name: Not on file  . Number of children: Not on file  . Years of education: Not on file  . Highest education level: Not on file  Occupational History  . Not on file  Social Needs  . Financial resource strain: Not on file  . Food insecurity:    Worry: Not on file    Inability: Not on file  . Transportation needs:    Medical: Not on file    Non-medical: Not on file  Tobacco Use  . Smoking status: Former Smoker    Last attempt to quit: 10/21/2005    Years since quitting: 12.8  . Smokeless tobacco: Never Used  Substance and Sexual Activity  . Alcohol use: Yes    Alcohol/week: 42.0 standard drinks    Types: 42 Cans of beer per week    Comment: patient states he drinks 1 quart of beer per day  . Drug use: No  . Sexual activity: Not on file  Lifestyle  . Physical activity:    Days per week: Not on file    Minutes per session: Not on file  . Stress: Not on file  Relationships  . Social connections:    Talks on phone: Not on file    Gets together: Not on file    Attends religious service: Not on file    Active member of club or organization: Not on file    Attends meetings of clubs or organizations: Not on file  Relationship status: Not on file  Other Topics Concern  . Not on file  Social History Narrative  . Not on file   Family History  Problem Relation Age of Onset  . Stroke Mother   . Alzheimer's disease Father   . Diabetes Brother    Allergies  Allergen Reactions  . Penicillins Rash    Has patient had a PCN reaction causing immediate rash, facial/tongue/throat swelling, SOB or lightheadedness with hypotension: Yes Has patient had a PCN reaction causing severe rash involving mucus membranes or skin necrosis: No Has patient had a PCN reaction that required hospitalization: No Has patient had a PCN reaction occurring within the last 10 years: No If all of the above  answers are "NO", then may proceed with Cephalosporin use.   Prior to Admission medications   Medication Sig Start Date End Date Taking? Authorizing Provider  amLODipine (NORVASC) 5 MG tablet Take 1 tablet by mouth daily as needed (BP OVER 160/90).  07/22/17  Yes [provider]  B Complex-Biotin-FA (SUPER B-COMPLEX) TABS Take 1 tablet by mouth daily.   Yes [provider]  carbidopa-levodopa (SINEMET CR) 50-200 MG tablet Take 1 tablet by mouth Nightly. 07/28/18 10/26/18 Yes [provider]  carbidopa-levodopa (SINEMET IR) 25-250 MG tablet Take 1 tablet by mouth 3 (three) times daily. 07/28/18 08/27/18 Yes [provider]  enalapril (VASOTEC) 5 MG tablet Take 5 mg by mouth daily. 08/02/18  Yes [provider]  folic acid (FOLVITE) 1 MG tablet Take 1 mg by mouth daily.   Yes [provider]  metoprolol succinate (TOPROL-XL) 50 MG 24 hr tablet Take 1 tablet (50 mg total) by mouth daily. Take with or immediately following a meal. 10/20/17  Yes Wieting, Richard, MD  Multiple Vitamin (MULTIVITAMIN WITH MINERALS) TABS tablet Take 1 tablet by mouth daily.   Yes [provider]  omeprazole (PRILOSEC) 40 MG capsule Take 40 mg by mouth daily.   Yes [provider]  thiamine 100 MG tablet Take 1 tablet (100 mg total) by mouth daily. 10/20/17  Yes Loletha Grayer, MD  acetaminophen (TYLENOL) 325 MG tablet Take 2 tablets (650 mg total) by mouth every 4 (four) hours as needed for moderate pain (or Fever >/= 101). 10/04/17   Max Sane, MD  cephALEXin (KEFLEX) 500 MG capsule Take 1 capsule (500 mg total) by mouth every 8 (eight) hours. Patient not taking: Reported on 08/07/2018 10/20/17   Loletha Grayer, MD  glucagon 1 MG injection 1 mg by Other route once as needed (for severe hypoglycemia).     [provider]  Insulin Glargine (LANTUS) 100 UNIT/ML Solostar Pen Inject 8 Units into the skin every morning. Patient taking differently:  Inject 8 Units into the skin every morning. continuous insulin pump 10/20/17   Loletha Grayer, MD  insulin lispro (HUMALOG) 100 UNIT/ML cartridge Inject 0.02 mLs (2 Units total) into the skin 3 (three) times daily with meals. Patient taking differently: Inject 2 Units into the skin 3 (three) times daily with meals. Has continuous insulin pump 10/20/17   Loletha Grayer, MD  oxyCODONE-acetaminophen (PERCOCET/ROXICET) 5-325 MG tablet Take 1 tablet by mouth every 4 (four) hours as needed for moderate pain or severe pain. Patient not taking: Reported on 08/07/2018 10/20/17   Loletha Grayer, MD   Dg Chest 1 View  Result Date: 08/07/2018 CLINICAL DATA:  Fall. EXAM: CHEST  1 VIEW COMPARISON:  Radiograph October 16, 2017. FINDINGS: The heart size and mediastinal contours are within normal limits.  Both lungs are clear. No pneumothorax or pleural effusion is noted. Old left rib fractures are noted. IMPRESSION: No active disease. Electronically Signed   By: Marijo Conception, M.D.   On: 08/07/2018 15:51   Ct Head Wo Contrast  Result Date: 08/07/2018 CLINICAL DATA:  Fall at home.  Hyperglycemia.  Left facial bruising. EXAM: CT HEAD WITHOUT CONTRAST CT MAXILLOFACIAL WITHOUT CONTRAST CT CERVICAL SPINE WITHOUT CONTRAST TECHNIQUE: Multidetector CT imaging of the head, cervical spine, and maxillofacial structures were performed using the standard protocol without intravenous contrast. Multiplanar CT image reconstructions of the cervical spine and maxillofacial structures were also generated. COMPARISON:  Multiple exams, including 10/02/2017 FINDINGS: CT HEAD FINDINGS Brain: The brainstem, cerebellum, cerebral peduncles, thalami, basal ganglia, basilar cisterns, and ventricular system appear within normal limits. Periventricular white matter and corona radiata hypodensities favor chronic ischemic microvascular white matter disease. The previous low-density left frontal extra-axial fluid prominence is no longer present.  Vascular: Unremarkable Skull: Unremarkable Other: Left lateral periorbital soft tissue swelling. CT MAXILLOFACIAL FINDINGS Osseous: No facial fractures identified. The patient has several likely mandibular dental cavities. Orbits: No intraorbital/postseptal abnormality. The globes appear intact. Sinuses: Unremarkable Soft tissues: Left periorbital soft tissue swelling/subcutaneous hematoma, especially lateral. There is potentially some soft tissue swelling along the left chin. CT CERVICAL SPINE FINDINGS Alignment: No vertebral subluxation is observed. Skull base and vertebrae: No cervical spine fracture identified. No acute bony findings. Soft tissues and spinal canal: Right common carotid atherosclerotic calcification. Disc levels:  No bony impingement identified. Upper chest: Unremarkable Other: No supplemental non-categorized findings. IMPRESSION: 1. Left periorbital soft tissue swelling/subcutaneous hematoma, without intraorbital extension and without facial fracture. 2. No acute intracranial findings. No acute cervical spine findings. 3. Periventricular white matter and corona radiata hypodensities favor chronic ischemic microvascular white matter disease. 4. Mild right common carotid artery atherosclerotic calcification. 5. Several mandibular dental cavities. Electronically Signed   By: Van Clines M.D.   On: 08/07/2018 15:55   Ct Cervical Spine Wo Contrast  Result Date: 08/07/2018 CLINICAL DATA:  Fall at home.  Hyperglycemia.  Left facial bruising. EXAM: CT HEAD WITHOUT CONTRAST CT MAXILLOFACIAL WITHOUT CONTRAST CT CERVICAL SPINE WITHOUT CONTRAST TECHNIQUE: Multidetector CT imaging of the head, cervical spine, and maxillofacial structures were performed using the standard protocol without intravenous contrast. Multiplanar CT image reconstructions of the cervical spine and maxillofacial structures were also generated. COMPARISON:  Multiple exams, including 10/02/2017 FINDINGS: CT HEAD FINDINGS  Brain: The brainstem, cerebellum, cerebral peduncles, thalami, basal ganglia, basilar cisterns, and ventricular system appear within normal limits. Periventricular white matter and corona radiata hypodensities favor chronic ischemic microvascular white matter disease. The previous low-density left frontal extra-axial fluid prominence is no longer present. Vascular: Unremarkable Skull: Unremarkable Other: Left lateral periorbital soft tissue swelling. CT MAXILLOFACIAL FINDINGS Osseous: No facial fractures identified. The patient has several likely mandibular dental cavities. Orbits: No intraorbital/postseptal abnormality. The globes appear intact. Sinuses: Unremarkable Soft tissues: Left periorbital soft tissue swelling/subcutaneous hematoma, especially lateral. There is potentially some soft tissue swelling along the left chin. CT CERVICAL SPINE FINDINGS Alignment: No vertebral subluxation is observed. Skull base and vertebrae: No cervical spine fracture identified. No acute bony findings. Soft tissues and spinal canal: Right common carotid atherosclerotic calcification. Disc levels:  No bony impingement identified. Upper chest: Unremarkable Other: No supplemental non-categorized findings. IMPRESSION: 1. Left periorbital soft tissue swelling/subcutaneous hematoma, without intraorbital extension and without facial fracture. 2. No acute intracranial findings. No acute cervical spine findings. 3. Periventricular white matter and corona radiata  hypodensities favor chronic ischemic microvascular white matter disease. 4. Mild right common carotid artery atherosclerotic calcification. 5. Several mandibular dental cavities. Electronically Signed   By: Van Clines M.D.   On: 08/07/2018 15:55   Ct Pelvis Wo Contrast  Result Date: 08/07/2018 CLINICAL DATA:  Fall.  Deformity of the left hip. EXAM: CT PELVIS WITHOUT CONTRAST TECHNIQUE: Multidetector CT imaging of the pelvis was performed following the standard  protocol without intravenous contrast. COMPARISON:  None. FINDINGS: Urinary Tract:  Unremarkable Bowel:  Unremarkable Vascular/Lymphatic: Unremarkable Reproductive:  Unremarkable Other:  No supplemental non-categorized findings. Musculoskeletal: Comminuted intertrochanteric fracture of the left hip. Multiple lesser trochanter fragments and several greater trochanter fragments. Mild varus angulation. Remote L4 compression fracture. The patient's right hand was included in imaging incidentally. There is evidence of plate and screw fixator along the proximal phalanx of the thumb, what appears to be some callus formation at likely chronic deformity of the index finger metacarpal, correlate with patient history. IMPRESSION: 1. Acute comminuted intratrochanteric fracture of the left proximal femur. Mild Verus angulation. 2. Remote L4 compression fracture. 3. Old deformities in the right hand which was incidentally included in the imaging volume. Electronically Signed   By: Van Clines M.D.   On: 08/07/2018 16:12   Dg Shoulder Left  Result Date: 08/07/2018 CLINICAL DATA:  Fall. EXAM: LEFT SHOULDER - 2+ VIEW COMPARISON:  Radiographs of Oct 02, 2017. FINDINGS: Old displaced proximal left humeral head and neck fracture is noted. Heterotopic bone formation and callus is noted consistent with old fracture. Glenohumeral joint is not well visualized due to overlying fracture. Acromioclavicular joint appears normal. Old left rib fractures are noted. IMPRESSION: Old proximal left humeral fracture is noted. No definite acute abnormality is noted. Electronically Signed   By: Marijo Conception, M.D.   On: 08/07/2018 15:50   Ct Maxillofacial Wo Contrast  Result Date: 08/07/2018 CLINICAL DATA:  Fall at home.  Hyperglycemia.  Left facial bruising. EXAM: CT HEAD WITHOUT CONTRAST CT MAXILLOFACIAL WITHOUT CONTRAST CT CERVICAL SPINE WITHOUT CONTRAST TECHNIQUE: Multidetector CT imaging of the head, cervical spine, and  maxillofacial structures were performed using the standard protocol without intravenous contrast. Multiplanar CT image reconstructions of the cervical spine and maxillofacial structures were also generated. COMPARISON:  Multiple exams, including 10/02/2017 FINDINGS: CT HEAD FINDINGS Brain: The brainstem, cerebellum, cerebral peduncles, thalami, basal ganglia, basilar cisterns, and ventricular system appear within normal limits. Periventricular white matter and corona radiata hypodensities favor chronic ischemic microvascular white matter disease. The previous low-density left frontal extra-axial fluid prominence is no longer present. Vascular: Unremarkable Skull: Unremarkable Other: Left lateral periorbital soft tissue swelling. CT MAXILLOFACIAL FINDINGS Osseous: No facial fractures identified. The patient has several likely mandibular dental cavities. Orbits: No intraorbital/postseptal abnormality. The globes appear intact. Sinuses: Unremarkable Soft tissues: Left periorbital soft tissue swelling/subcutaneous hematoma, especially lateral. There is potentially some soft tissue swelling along the left chin. CT CERVICAL SPINE FINDINGS Alignment: No vertebral subluxation is observed. Skull base and vertebrae: No cervical spine fracture identified. No acute bony findings. Soft tissues and spinal canal: Right common carotid atherosclerotic calcification. Disc levels:  No bony impingement identified. Upper chest: Unremarkable Other: No supplemental non-categorized findings. IMPRESSION: 1. Left periorbital soft tissue swelling/subcutaneous hematoma, without intraorbital extension and without facial fracture. 2. No acute intracranial findings. No acute cervical spine findings. 3. Periventricular white matter and corona radiata hypodensities favor chronic ischemic microvascular white matter disease. 4. Mild right common carotid artery atherosclerotic calcification. 5. Several mandibular dental cavities. Electronically Signed  By: Van Clines M.D.   On: 08/07/2018 15:55    Positive ROS: All other systems have been reviewed and were otherwise negative with the exception of those mentioned in the HPI and as above.  Physical Exam: General:  Alert, no acute distress Psychiatric:  Patient is competent for consent with normal mood and affect   Cardiovascular:  No pedal edema Respiratory:  No wheezing, non-labored breathing GI:  Abdomen is soft and non-tender Skin:  No lesions in the area of chief complaint Neurologic:  Sensation intact distally Lymphatic:  No axillary or cervical lymphadenopathy  Orthopedic Exam:  Orthopedic examination is limited to the left lower extremity and foot.  The left lower extremity is somewhat shortened and externally rotated as compared to the right.  Skin inspection around the left hip is unremarkable.  There is no swelling, erythema, ecchymosis, abrasions, or other skin abnormalities identified.  He has mild to moderate tenderness to palpation over the lateral aspect of the left hip.  He has more severe pain with any attempted active or passive motion of the hip.  He is able to dorsiflex and plantarflex his toes and ankle.  Sensations intact light touch to all distributions.  He has good capillary refill to his left foot.  X-rays:  A CT scan of the pelvis is available for review and has been reviewed by myself.  This study demonstrates a displaced comminuted intertrochanteric fracture of the left hip with subtrochanteric extension.  No significant degenerative changes of the hip joint itself are identified.  No lytic lesions are noted either.  Assessment: Closed displaced intertrochanteric fracture left hip.  Plan: The treatment options have been discussed with the patient, as have the potential risks (including bleeding, infection, nerve and blood vessel injury, persistent or recurrent pain, malunion and/or nonunion, need for further surgery, blood clots, strokes, heart attacks  and/or arrhythmias, etc.) and benefits.  The patient states his understanding wishes to proceed.  A formal written consent will be obtained by the nursing staff.  Thank you for asking me to participate in the care of this most pleasant yet unfortunate man.  I will be happy to follow him with you.   Pascal Lux, MD  Beeper #:  [phone]  08/08/2018 8:14 AM

## 2018-08-08 NOTE — Progress Notes (Signed)
Due to current hospital policies regarding LFYBO-17 suspected patient, I have not seen the patient physically in ICU as he is seen and managed by intensivist.  Brief assessment and plan.  *DKA-resolving.  *Hip fracture-plan for surgery by orthopedic.  *Sepsis-suspected COVID-19-broad-spectrum antibiotics, cultures are sent, influenza negative.  *Acute kidney injury-monitor with IV fluids.  *Rhabdomyolysis-monitor with IV fluids.  *Anemia-we may need to give him iron tablets on discharge.  Monitor.

## 2018-08-08 NOTE — Anesthesia Post-op Follow-up Note (Signed)
Anesthesia QCDR form completed.        

## 2018-08-08 NOTE — Op Note (Signed)
08/08/2018  5:56 PM  Patient:   John Reilly  Pre-Op Diagnosis:   Closed displaced 4-part intertrochanteric fracture, left hip.  Post-Op Diagnosis:   Same  Procedure:   Reduction and internal fixation of closed displaced 4-part intertrochanteric left hip fracture with Biomet Affixis TFN nail.  Surgeon:   Pascal Lux, MD  Assistant:   None  Anesthesia:   GET  Findings:   As above  Complications:   None  EBL:   25 cc  Fluids:   800 cc crystalloid  UOP:   50 cc  TT:   None  Drains:   None  Closure:   Staples  Implants:   Biomet Affixis 11 x 400 mm TFN with a 105 mm lag screw and a 46 mm distal interlocking screw  Brief Clinical Note:   The patient is a 63 year old male who sustained the above-noted injury yesterday following an apparent fall at home. He was brought to the emergency room where a CT scan demonstrated the above-noted fracture. The patient had multiple other concurrent medical issues, requiring him to be admitted directly to the ICU. The patient has been cleared medically and presents at this time for reduction and internal fixation of the displaced 4-part intertrochanteric left hip fracture.  Procedure:   The patient was brought into the operating room. After adequate general endotracheal intubation and anesthesia was obtained, the patient was lain in the supine position on the fracture table. The uninjured leg was placed in a flexed and abducted position while the injured lower extremity was placed in longitudinal traction. The fracture was reduced using longitudinal traction and internal rotation. The adequacy of reduction was verified fluoroscopically in AP and lateral projections and found to be near anatomic. The lateral aspects of the left hip and thigh were prepped with ChloraPrep solution before being draped sterilely. Preoperative antibiotics were administered. A timeout was performed to verify the appropriate surgical site. The greater trochanter was  identified fluoroscopically and an approximately 3 cm incision made about 2-3 fingerbreadths above the tip of the greater trochanter. The incision was carried down through the subcutaneous tissues to expose the gluteal fascia. This was split the length of the incision, providing access to the tip of the trochanter. Under fluoroscopic guidance, a guidewire was drilled through the tip of the trochanter into the proximal metaphysis to the level of the lesser trochanter. After verifying its position fluoroscopically in AP and lateral projections, it was overreamed with the initial reamer to the depth of the lesser trochanter. A guidewire was passed down through the femoral canal to the supracondylar region. The adequacy of guidewire position was verified fluoroscopically in AP and lateral projections before the length of the guidewire within the canal was measured and found to be 420 mm. Therefore, a 400 mm length nail was selected. The guidewire was overreamed sequentially using the flexible reamers, beginning with a 10.5 mm reamer and progressing to a 12.5 mm reamer. This provided good cortical chatter. The 11 x 400 mm Biomet Affixis TFN rod was selected and advanced to the appropriate depth, as verified fluoroscopically.   The guide system for the lag screw was positioned and advanced through an approximately 2 cm stab incision over the lateral aspect of the proximal femur. The guidewire was drilled up through the trochanteric femoral nail and into the femoral neck to rest within 5 mm of subchondral bone. After verifying its position in the femoral neck and head in both AP and lateral projections, the guidewire  was measured and found to be optimally replicated by a 762 mm lag screw. The guidewire was overreamed to the appropriate depth before the lag screw was inserted and advanced to the appropriate depth as verified fluoroscopically in AP and lateral projections. The locking screw was advanced, then backed off a  quarter turn to set the lag screw. Again the adequacy of hardware position and fracture reduction was verified fluoroscopically in AP and lateral projections and found to be excellent.  Attention was directed distally. Using the "perfect circle" technique, the leg and fluoroscopy machine were positioned appropriately. An approximately 1.5 cm stab incision was made over the skin at the appropriate point before the drill bit was advanced through the cortex and across the static hole of the nail. The appropriate length of the screw was determined before the 46 mm distal interlocking screw was positioned, then advanced and tightened securely. Again the adequacy of screw position was verified fluoroscopically in AP and lateral projections and found to be excellent.  The wounds were irrigated thoroughly with sterile saline solution before the abductor fascia was reapproximated using #1 Vicryl interrupted sutures. The subcutaneous tissues were closed using 2-0 Vicryl interrupted sutures. The skin was closed using staples. A total of 30 cc of 0.5% Sensorcaine with epinephrine was injected in and around all incisions. Sterile occlusive dressings were applied to all wounds before the patient was transferred back to his/her hospital bed. The patient was then transferred to the recovery room in satisfactory condition after tolerating the procedure well.

## 2018-08-09 ENCOUNTER — Encounter: Payer: Self-pay | Admitting: Surgery

## 2018-08-09 LAB — CBC WITH DIFFERENTIAL/PLATELET
Abs Immature Granulocytes: 0.05 10*3/uL (ref 0.00–0.07)
Basophils Absolute: 0 10*3/uL (ref 0.0–0.1)
Basophils Relative: 0 %
EOS PCT: 0 %
Eosinophils Absolute: 0 10*3/uL (ref 0.0–0.5)
HCT: 24.9 % — ABNORMAL LOW (ref 39.0–52.0)
Hemoglobin: 8.2 g/dL — ABNORMAL LOW (ref 13.0–17.0)
Immature Granulocytes: 1 %
Lymphocytes Relative: 13 %
Lymphs Abs: 1.4 10*3/uL (ref 0.7–4.0)
MCH: 31.4 pg (ref 26.0–34.0)
MCHC: 32.9 g/dL (ref 30.0–36.0)
MCV: 95.4 fL (ref 80.0–100.0)
Monocytes Absolute: 0.9 10*3/uL (ref 0.1–1.0)
Monocytes Relative: 8 %
NEUTROS ABS: 8.6 10*3/uL — AB (ref 1.7–7.7)
NEUTROS PCT: 78 %
PLATELETS: 283 10*3/uL (ref 150–400)
RBC: 2.61 MIL/uL — ABNORMAL LOW (ref 4.22–5.81)
RDW: 12.8 % (ref 11.5–15.5)
WBC: 10.9 10*3/uL — ABNORMAL HIGH (ref 4.0–10.5)
nRBC: 0 % (ref 0.0–0.2)

## 2018-08-09 LAB — BLOOD GAS, VENOUS
Patient temperature: 37
pCO2, Ven: 31 mmHg — ABNORMAL LOW (ref 44.0–60.0)
pH, Ven: 7.28 (ref 7.250–7.430)

## 2018-08-09 LAB — GLUCOSE, CAPILLARY
Glucose-Capillary: 106 mg/dL — ABNORMAL HIGH (ref 70–99)
Glucose-Capillary: 84 mg/dL (ref 70–99)
Glucose-Capillary: 182 mg/dL — ABNORMAL HIGH (ref 70–99)
Glucose-Capillary: 125 mg/dL — ABNORMAL HIGH (ref 70–99)
Glucose-Capillary: 138 mg/dL — ABNORMAL HIGH (ref 70–99)

## 2018-08-09 LAB — URINE CULTURE: Culture: 80000 — AB

## 2018-08-09 LAB — BASIC METABOLIC PANEL
Anion gap: 9 (ref 5–15)
BUN: 17 mg/dL (ref 8–23)
CO2: 24 mmol/L (ref 22–32)
Calcium: 7.9 mg/dL — ABNORMAL LOW (ref 8.9–10.3)
Chloride: 99 mmol/L (ref 98–111)
Creatinine, Ser: 0.94 mg/dL (ref 0.61–1.24)
GFR calc Af Amer: >60 mL/min (ref >60)
GFR calc non Af Amer: >60 mL/min (ref >60)
Glucose, Bld: 87 mg/dL (ref 70–99)
Potassium: 3.9 mmol/L (ref 3.5–5.1)
Sodium: 132 mmol/L — ABNORMAL LOW (ref 135–145)

## 2018-08-09 LAB — HEPATIC FUNCTION PANEL
ALT: 6 U/L (ref 0–44)
AST: 44 U/L — ABNORMAL HIGH (ref 15–41)
Albumin: 2.6 g/dL — ABNORMAL LOW (ref 3.5–5.0)
Alkaline Phosphatase: 82 U/L (ref 38–126)
Bilirubin, Direct: <0.1 mg/dL (ref 0.0–0.2)
TOTAL PROTEIN: 5.7 g/dL — AB (ref 6.5–8.1)
Total Bilirubin: 0.5 mg/dL (ref 0.3–1.2)

## 2018-08-09 LAB — PROCALCITONIN: PROCALCITONIN: 1.18 ng/mL

## 2018-08-09 LAB — NOVEL CORONAVIRUS, NAA (HOSPITAL ORDER, SEND-OUT TO REF LAB): SARS-COV-2, NAA: DETECTED — AB

## 2018-08-09 LAB — NOVEL CORONAVIRUS, NAA (HOSP ORDER, SEND-OUT TO REF LAB; TAT 18-24 HRS)

## 2018-08-09 MED ORDER — INSULIN ASPART 100 UNIT/ML ~~LOC~~ SOLN: 0.0000 [IU] | SUBCUTANEOUS | Status: DC

## 2018-08-09 MED ORDER — BENZOCAINE 10 % MT GEL
Freq: Three times a day (TID) | OROMUCOSAL | Status: DC | PRN
  Administered 2018-08-09: 1 via OROMUCOSAL
  Administered 2018-08-09: 21:00:00 via OROMUCOSAL
  Filled 2018-08-09: qty 9

## 2018-08-09 MED ORDER — CHLORHEXIDINE GLUCONATE CLOTH 2 % EX PADS
6.0000 | MEDICATED_PAD | Freq: Every day | CUTANEOUS | Status: DC
  Administered 2018-08-10: 6 via TOPICAL

## 2018-08-09 MED ORDER — MUPIROCIN 2 % EX OINT
1.0000 "application " | TOPICAL_OINTMENT | Freq: Two times a day (BID) | CUTANEOUS | Status: DC
  Administered 2018-08-09 – 2018-08-10 (×2): 1 via NASAL
  Filled 2018-08-09: qty 22

## 2018-08-09 MED ORDER — INSULIN ASPART 100 UNIT/ML ~~LOC~~ SOLN
0.0000 [IU] | Freq: Three times a day (TID) | SUBCUTANEOUS | Status: DC
  Administered 2018-08-09: 2 [IU] via SUBCUTANEOUS
  Filled 2018-08-09: qty 1

## 2018-08-09 MED ORDER — CEFAZOLIN SODIUM-DEXTROSE 1-4 GM/50ML-% IV SOLN
1.0000 g | Freq: Three times a day (TID) | INTRAVENOUS | Status: DC
  Administered 2018-08-09 – 2018-08-10 (×2): 1 g via INTRAVENOUS
  Filled 2018-08-09 (×6): qty 50

## 2018-08-09 NOTE — Anesthesia Postprocedure Evaluation (Signed)
Anesthesia Post Note  Patient: John Reilly  Procedure(s) Performed: INTRAMEDULLARY (IM) NAIL INTERTROCHANTRIC (Left Hip)  Patient location during evaluation: PACU Anesthesia Type: General Level of consciousness: awake and alert Pain management: pain level controlled Vital Signs Assessment: post-procedure vital signs reviewed and stable Respiratory status: spontaneous breathing, nonlabored ventilation, respiratory function stable and patient connected to nasal cannula oxygen Cardiovascular status: blood pressure returned to baseline and stable Postop Assessment: no apparent nausea or vomiting Anesthetic complications: no     Last Vitals:  Vitals:   08/09/18 0300 08/09/18 0400  BP: 126/63   Pulse: 82 78  Resp: 16 16  Temp:    SpO2: 96% 98%    Last Pain:  Vitals:   08/09/18 0035  TempSrc:   PainSc: Asleep                 Molli Barrows

## 2018-08-09 NOTE — Progress Notes (Signed)
Patient dropped two tablets of oxycodone down the front of his gown and they were unable to be located.  Two additional oxycodone pulled from Pyxis and given to patient.  Will continue to search for pills.

## 2018-08-09 NOTE — Progress Notes (Signed)
Subjective: 1 Day Post-Op Procedure(s) (LRB): INTRAMEDULLARY (IM) NAIL INTERTROCHANTRIC (Left) Patient reports pain as mild.   Patient is well but is currently in the ICU due to other medical issues, also currently being checked for possible Novel Coronavirus PT and care management to assist with discharge planning when appropriate. Negative for chest pain and shortness of breath Fever: no Gastrointestinal:Negative for nausea and vomiting  Objective: Vital signs in last 24 hours: Temp:  [97.4 F (36.3 C)-97.5 F (36.4 C)] 97.5 F (36.4 C) (03/31 0800) Pulse Rate:  [72-97] 88 (03/31 1000) Resp:  [13-27] 13 (03/31 1000) BP: (113-170)/(55-94) 129/64 (03/31 0500) SpO2:  [93 %-100 %] 99 % (03/31 1000) Weight:  [78.4 kg] 78.4 kg (03/31 0600)  Intake/Output from previous day:  Intake/Output Summary (Last 24 hours) at 08/09/2018 1037 Last data filed at 08/09/2018 0800 Gross per 24 hour  Intake 1699.31 ml  Output 1075 ml  Net 624.31 ml    Intake/Output this shift: No intake/output data recorded.  Labs: Recent Labs    08/07/18 1506 08/08/18 0555 08/09/18 0519  HGB 10.9* 8.7* 8.2*   Recent Labs    08/08/18 0555 08/09/18 0519  WBC 14.8* 10.9*  RBC 2.77* 2.61*  HCT 25.6* 24.9*  PLT 339 283   Recent Labs    08/08/18 0206 08/09/18 0519  NA 126* 132*  K 4.0 3.9  CL 95* 99  CO2 22 24  BUN 25* 17  CREATININE 1.39* 0.94  GLUCOSE 123* 87  CALCIUM 7.7* 7.9*   No results for input(s): LABPT, INR in the last 72 hours.   EXAM General - Patient is Alert, Appropriate and Oriented Extremity - ABD soft Sensation intact distally Intact pulses distally Dorsiflexion/Plantar flexion intact Incision: dressing C/D/I No cellulitis present Dressing/Incision - clean, dry, no drainage Motor Function - intact, moving foot and toes well on exam.  Abdomen is soft with normal BS this AM.  Past Medical History:  Diagnosis Date  . Cancer ([place])    Pt reports on 10/02/17 that he has  never been dx with cancer  . COPD (chronic obstructive pulmonary disease) ([place])   . Diabetes mellitus without complication ([place])   . GERD (gastroesophageal reflux disease)   . Hypertension   . Multiple duodenal ulcers   . Peripheral vascular disease ([facility])    Peripheral Neuropathy  . Retinopathy     Assessment/Plan: 1 Day Post-Op Procedure(s) (LRB): INTRAMEDULLARY (IM) NAIL INTERTROCHANTRIC (Left) Active Problems:   DKA (diabetic ketoacidoses) ([facility])   Altered mental status   Closed fracture of left hip ([place])   Fall  Estimated body mass index is 24.8 kg/m as calculated from the following:   Height as of this encounter: 5\' 10"  (1.778 m).   Weight as of this encounter: 78.4 kg. Advance diet   Labs reviewed this AM.   Hg 8.2, continue to monitor. On Rocephin/Cleocin for sepsis likely due to UTI, pharmacy monitoring. Currently on contact precautions will not be able to work with PT at this time. Novel Coronavirus labs pending at this time.  DVT Prophylaxis - Heparin and SCD's Weight-Bearing as tolerated to left leg  J. Cameron Proud, PA-C [facility] Orthopaedic Surgery 08/09/2018, 10:37 AM

## 2018-08-09 NOTE — Evaluation (Signed)
Physical Therapy Evaluation Patient Details Name: John Reilly MRN: [id number] DOB: 1955-08-09 Today's Date: 08/09/2018   History of Present Illness  presented to ER secondary to ER secondary to fall in home environment, noted with hyperglycemia; admitted with sepsis of unknown etiology, L hip fracture s/p IM nailing/ORIF (3/30) and mild rhabdo.  Also of note, patient pending COVID-19 testing (due to sepsis of unknown origin) with test collected/sent 08/07/18.  Clinical Impression  Upon evaluation, patient alert and oriented; follows commands and demonstrates good effort with functional activities.  Rates L LE pain 6/10, but demonstrates fair/good post-op strength (at least 3-/5) and ROM throughout session.  Currently requiring mod assist for bed mobility; min/mod assist for sit/stand, basic transfers and short-distance gait (12') with RW.  3-point, step to gait pattern with decreased stance time/weight acceptance L LE; slow and guarded but no overt buckling or LOB.  Anticipate continued progression towards all goals as pain controlled and confidence improves. Of note, vitals stable and WFL on RA throughout session; no SOB, respiratory difficulty noted or reported. Would benefit from skilled PT to address above deficits and promote optimal return to PLOF; recommend transition to STR upon discharge from acute hospitalization.  Will assess/update as appropriate.     Follow Up Recommendations SNF(will continue to monitor and progress/update as appropriate)    Equipment Recommendations  Rolling walker with 5" wheels    Recommendations for Other Services       Precautions / Restrictions Precautions Precautions: Fall Restrictions Weight Bearing Restrictions: Yes LLE Weight Bearing: Weight bearing as tolerated      Mobility  Bed Mobility Overal bed mobility: Needs Assistance Bed Mobility: Supine to Sit     Supine to sit: Min assist;Mod assist     General bed mobility comments: assist  for LE management, UE placement and truncal elevation  Transfers Overall transfer level: Needs assistance Equipment used: Rolling walker (2 wheeled) Transfers: Sit to/from Stand Sit to Stand: Min assist;Mod assist         General transfer comment: cuing for hand placement, lift off and standing balance  Ambulation/Gait Ambulation/Gait assistance: Min assist;Mod assist Gait Distance (Feet): 12 Feet Assistive device: Rolling walker (2 wheeled)       General Gait Details: 3-point, step to gait pattern with decreased stance time L LE; increased pain with L LE advancement.  Slow and deliberate, but no overt buckling or LOB  Stairs            Wheelchair Mobility    Modified Rankin (Stroke Patients Only)       Balance Overall balance assessment: Needs assistance Sitting-balance support: No upper extremity supported;Feet supported Sitting balance-[name]: Fair     Standing balance support: Bilateral upper extremity supported Standing balance-[name]: Poor                               Pertinent Vitals/Pain Pain Assessment: Faces Faces Pain Scale: Hurts even more Pain Location: L LE Pain Descriptors / Indicators: Aching;Grimacing;Guarding Pain Intervention(s): Limited activity within patient's tolerance;Monitored during session;Repositioned    Home Living Family/patient expects to be discharged to:: Private residence Living Arrangements: Spouse/significant other Available Help at Discharge: Family Type of Home: House Home Access: Stairs to enter Entrance Stairs-Rails: Right Entrance Stairs-Number of Steps: 3 to enter front with rails, 1 in garage with no rails (prefers garage entry)   Home Equipment: Cane - quad;Bedside commode      Prior Function Level  of Independence: Independent with assistive device(s)         Comments: Mod indep for ADLs, household and limited community distances, intermittent use of quad cane; does endorse  history of falls (at least 2 in previous 1-2 weeks)     Hand Dominance        Extremity/Trunk Assessment   Upper Extremity Assessment Upper Extremity Assessment: Overall WFL for tasks assessed    Lower Extremity Assessment Lower Extremity Assessment: (L LE grossly at least 3-/5, limited by pain; R LE at least 4-/5 throughout)       Communication   Communication: No difficulties  Cognition Arousal/Alertness: Awake/alert Behavior During Therapy: WFL for tasks assessed/performed Overall Cognitive Status: Within Functional Limits for tasks assessed                                        General Comments      Exercises Other Exercises Other Exercises: Supine LE therex, 1x10, act assist ROM: ankle pumps, quad sets, SAQs, heel slides, hip abduct/adduct.  Good isolated muscle activation and movement Other Exercises: Sit/stand x2 with RW, min/mod assist for lift off, balance   Assessment/Plan    PT Assessment Patient needs continued PT services  PT Problem List Decreased strength;Decreased range of motion;Decreased activity tolerance;Decreased balance;Decreased mobility;Decreased coordination;Decreased cognition;Decreased knowledge of use of DME;Decreased safety awareness;Cardiopulmonary status limiting activity;Decreased skin integrity;Pain       PT Treatment Interventions DME instruction;Gait training;Stair training;Functional mobility training;Therapeutic activities;Therapeutic exercise;Balance training;Patient/family education    PT Goals (Current goals can be found in the Care Plan section)  Acute Rehab PT Goals Patient Stated Goal: to get moving PT Goal Formulation: With patient Time For Goal Achievement: 08/23/18 Potential to Achieve Goals: Good    Frequency 7X/week   Barriers to discharge        Co-evaluation               AM-PAC PT "6 Clicks" Mobility  Outcome Measure Help needed turning from your back to your side while in a flat bed  without using bedrails?: A Little Help needed moving from lying on your back to sitting on the side of a flat bed without using bedrails?: A Lot Help needed moving to and from a bed to a chair (including a wheelchair)?: A Lot Help needed standing up from a chair using your arms (e.g., wheelchair or bedside chair)?: A Lot Help needed to walk in hospital room?: A Lot Help needed climbing 3-5 steps with a railing? : A Lot 6 Click Score: 13    End of Session   Activity Tolerance: Patient tolerated treatment well Patient left: in chair;with call bell/phone within reach;with chair alarm set Nurse Communication: Mobility status PT Visit Diagnosis: Muscle weakness (generalized) (M62.81);Difficulty in walking, not elsewhere classified (R26.2);Pain Pain - Right/Left: Right Pain - part of body: Hip    Time: 1445-1521 PT Time Calculation (min) (ACUTE ONLY): 36 min   Charges:   PT Evaluation $PT Eval Moderate Complexity: 1 Mod PT Treatments $Therapeutic Activity: 23-37 mins        Shenia Alan H. Owens Shark, PT, DPT, NCS 08/09/18, 3:55 PM [phone]

## 2018-08-09 NOTE — Progress Notes (Signed)
[facility] [place] NAME: John Reilly    MR#:  [id number]  DATE OF BIRTH:  Apr 29, 1956  SUBJECTIVE:  CHIEF COMPLAINT:   Chief Complaint  Patient presents with  . Fall  . Hyperglycemia   Came with DKA, Fall, hip fracture, fever. Sx done on hip, DKA resolved, much improved. Alert , oriented.  REVIEW OF SYSTEMS:  CONSTITUTIONAL: No fever, have fatigue or weakness.  EYES: No blurred or double vision.  EARS, NOSE, AND THROAT: No tinnitus or ear pain.  RESPIRATORY: No cough, shortness of breath, wheezing or hemoptysis.  CARDIOVASCULAR: No chest pain, orthopnea, edema.  GASTROINTESTINAL: No nausea, vomiting, diarrhea or abdominal pain.  GENITOURINARY: No dysuria, hematuria.  ENDOCRINE: No polyuria, nocturia,  HEMATOLOGY: No anemia, easy bruising or bleeding SKIN: No rash or lesion. MUSCULOSKELETAL: No joint pain or arthritis.   NEUROLOGIC: No tingling, numbness, weakness.  PSYCHIATRY: No anxiety or depression.   ROS  DRUG ALLERGIES:   Allergies  Allergen Reactions  . Penicillins Rash    Has patient had a PCN reaction causing immediate rash, facial/tongue/throat swelling, SOB or lightheadedness with hypotension: Yes Has patient had a PCN reaction causing severe rash involving mucus membranes or skin necrosis: No Has patient had a PCN reaction that required hospitalization: No Has patient had a PCN reaction occurring within the last 10 years: No If all of the above answers are "NO", then may proceed with Cephalosporin use.    VITALS:  Blood pressure 137/66, pulse 80, temperature (!) 97.5 F (36.4 C), temperature source Axillary, resp. rate 20, height 5\' 10"  (1.778 m), weight 78.4 kg, SpO2 100 %.  PHYSICAL EXAMINATION:  GENERAL:  63 y.o.-year-old patient lying in the bed with no acute distress.  EYES: Pupils equal, round, reactive to light and accommodation. No scleral icterus. Extraocular muscles intact.  HEENT: Head atraumatic,  normocephalic. Oropharynx and nasopharynx clear.  NECK:  Supple, no jugular venous distention. No thyroid enlargement, no tenderness.  LUNGS: Normal breath sounds bilaterally, no wheezing, rales,rhonchi or crepitation. No use of accessory muscles of respiration.  CARDIOVASCULAR: S1, S2 normal. No murmurs, rubs, or gallops.  ABDOMEN: Soft, nontender, nondistended. Bowel sounds present. No organomegaly or mass.  EXTREMITIES: No pedal edema, cyanosis, or clubbing.  NEUROLOGIC: Cranial nerves II through XII are intact. Muscle strength 5/5 in all extremities- except limited move on LLE due to recent surgery. Sensation intact. Gait not checked.  PSYCHIATRIC: The patient is alert and oriented x 3.  SKIN: No obvious rash, lesion, or ulcer.   Physical Exam LABORATORY PANEL:   CBC Recent Labs  Lab 08/09/18 0519  WBC 10.9*  HGB 8.2*  HCT 24.9*  PLT 283   ------------------------------------------------------------------------------------------------------------------  Chemistries  Recent Labs  Lab 08/09/18 0519  NA 132*  K 3.9  CL 99  CO2 24  GLUCOSE 87  BUN 17  CREATININE 0.94  CALCIUM 7.9*  AST 44*  ALT 6  ALKPHOS 82  BILITOT 0.5   ------------------------------------------------------------------------------------------------------------------  Cardiac Enzymes No results for input(s): TROPONINI in the last 168 hours. ------------------------------------------------------------------------------------------------------------------  RADIOLOGY:  Dg Chest 1 View  Result Date: 08/07/2018 CLINICAL DATA:  Fall. EXAM: CHEST  1 VIEW COMPARISON:  Radiograph October 16, 2017. FINDINGS: The heart size and mediastinal contours are within normal limits. Both lungs are clear. No pneumothorax or pleural effusion is noted. Old left rib fractures are noted. IMPRESSION: No active disease. Electronically Signed   By: Marijo Conception, M.D.   On: 08/07/2018 15:51  Dg Shoulder Left  Result Date:  08/07/2018 CLINICAL DATA:  Fall. EXAM: LEFT SHOULDER - 2+ VIEW COMPARISON:  Radiographs of Oct 02, 2017. FINDINGS: Old displaced proximal left humeral head and neck fracture is noted. Heterotopic bone formation and callus is noted consistent with old fracture. Glenohumeral joint is not well visualized due to overlying fracture. Acromioclavicular joint appears normal. Old left rib fractures are noted. IMPRESSION: Old proximal left humeral fracture is noted. No definite acute abnormality is noted. Electronically Signed   By: Marijo Conception, M.D.   On: 08/07/2018 15:50   Dg Hip Operative Unilat W Or W/o Pelvis Left  Result Date: 08/08/2018 CLINICAL DATA:  Left hip intramedullary nail. EXAM: OPERATIVE left HIP (WITH PELVIS IF PERFORMED) 4 VIEWS TECHNIQUE: Fluoroscopic spot image(s) were submitted for interpretation post-operatively. COMPARISON:  CT 08/07/2018 FINDINGS: Exam demonstrates placement of a left femoral intramedullary nail with associated screw bridging patient's intertrochanteric fracture into the femoral head as hardware is intact. There is anatomic alignment over the fracture site. IMPRESSION: Left intramedullary nail with associated screw bridging patient's intertrochanteric fracture with hardware intact and anatomic alignment over the fracture site. Electronically Signed   By: Marin Olp M.D.   On: 08/08/2018 19:26    ASSESSMENT AND PLAN:   Active Problems:   DKA (diabetic ketoacidoses) ([place])   Altered mental status   Closed fracture of left hip ([place])   Fall  *DKA-resolved. He was using Insuline pump before admission, will advise to hold it for now.  *Hip fracture- s/p surgery by orthopedic.  Once COVID 19 reports comes, will initiate PT and plan for rehab.  *Sepsis-UTI  suspected COVID-19-broad-spectrum antibiotics, cultures are sent, influenza negative. Urine culture reporting Klebsiella pneumoniae, currently on antibiotics. Staphylococci in 1 blood sample seems to be  contamination. COVID-19 result is still pending.  *Acute kidney injury-monitor with IV fluids. Improved.  *Rhabdomyolysis-monitor with IV fluids.  Improved.  *Anemia-we may need to give him iron tablets on discharge.  Monitor.  *Hyponatremia Improved with IV fluids and correction of DKA.     All the records are reviewed and case discussed with Care Management/Social Workerr. Management plans discussed with the patient, family and they are in agreement.  CODE STATUS: Full.  TOTAL TIME TAKING CARE OF THIS PATIENT: 35 minutes.     POSSIBLE D/C IN 1-2 DAYS, DEPENDING ON CLINICAL CONDITION.   Vaughan Basta M.D on 08/09/2018   Between 7am to 6pm - Pager - [phone]  After 6pm go to [email or web address] - password [email or web address] [facility]  Office  [phone]  CC: Primary care physician; Glendon Axe, MD  Note: This dictation was prepared with Dragon dictation along with smaller phrase technology. Any transcriptional errors that result from this process are unintentional.

## 2018-08-09 NOTE — Progress Notes (Signed)
MD made aware that pt tested positive for covid-19.

## 2018-08-09 NOTE — Progress Notes (Signed)
npatient Diabetes Program Recommendations  AACE/ADA: New Consensus Statement on Inpatient Glycemic Control  Target Ranges:  Prepandial:   less than 140 mg/dL      Peak postprandial:   less than 180 mg/dL (1-2 hours)      Critically ill patients:  140 - 180 mg/dL  Results for John Reilly, John Reilly (MRN [id number]) as of 08/09/2018 11:17  Ref. Range 08/08/2018 23:29 08/09/2018 03:41 08/09/2018 08:31  Glucose-Capillary Latest Ref Range: 70 - 99 mg/dL 221 (H)  Novolog 8 units 106 (H) 84   Results for John Reilly, John Reilly (MRN [id number]) as of 08/09/2018 11:17  Ref. Range 08/08/2018 00:50 08/08/2018 01:57 08/08/2018 04:44 08/08/2018 08:21 08/08/2018 13:18 08/08/2018 19:35  Glucose-Capillary Latest Ref Range: 70 - 99 mg/dL 135 (H)  Levemir 10 units 165 (H) 161 (H)  Novolog 4 units 191 (H)  Novolog 4 units 147 (H)  Novolog 2 units 153 (H)  Novolog 2 units  Levemir 10 units   Results for John Reilly, John Reilly (MRN [id number]) as of 08/08/2018 10:51  Ref. Range 08/07/2018 14:42 08/07/2018 16:37 08/07/2018 17:53 08/07/2018 18:44 08/07/2018 20:09 08/07/2018 21:05 08/07/2018 22:38 08/07/2018 23:40  Glucose-Capillary Latest Ref Range: 70 - 99 mg/dL 452 (H) 497 (H) 435 (H) 408 (H) 282 (H) 227 (H) 150 (H) 148 (H)   Review of Glycemic Control  Diabetes history: DM1 (makes NO insulin; requires basal, correction, and mal coverage insulin) Outpatient Diabetes medications: Medtronic 670G insulin pump with Humalog Current orders for Inpatient glycemic control: Levemir 10 units QHS, Novolog 0-24 units Q24H  Inpatient Diabetes Program Recommendations:   Correction (SSI): Please decrease Novolog correction scale to Novolog 0-9 units TID with meals and Novolog 0-5 units QHS.   Insulin - Meal Coverage: Please consider ordering Novlog 3 units TID with meals for meal coverage if patient eats at least 50% of meals.  NOTE: In reviewing chart, noted patient has Type 1 DM and is followed by Dr. Honor Junes (Endocrinologist) and was last seen on  02/28/2018.  Patient uses an insulin pump for outpatient DM control. Noted patient presented to the hospital in DKA and per ED triage note on 08/07/18 at 2:44 pm, "Pt with insulin pump with the cath pulled out of abd on arrival. Pump removed and turned off."   Patient was started on IV insulin drip and has been transition to SQ insulin regimen. Insulin pump settings should be as follows based on last office note by Dr. Honor Junes on 02/28/18:  Basal insulin  12A 0.625 units/hour 3A 0.55 units/hour 4P 0.725 units/hour 7P 0.725 units/hour Total daily basal insulin: 14.825 units/24 hours  Carb Coverage 12A  1:18 1 unit for every 18 grams of carbohydrates 6A  1:17 1 unit for every 17 grams of carbohydrates 630P  1:18 1 unit for every 18 grams of carbohydrates  Insulin Sensitivity 12A 1:60 1 unit drops blood glucose 60 mg/dl 2P 1:50 1 unit drops blood glucose 50 mg/dl  Target Glucose Goals 100-130 mg/dl  Active Insulin: 4 hours  Thanks, Barnie Alderman, RN, MSN, CDE Diabetes Coordinator Inpatient Diabetes Program [phone] (Team Pager from 8am to 5pm)

## 2018-08-09 NOTE — Progress Notes (Signed)
CRITICAL CARE NOTE      SUBJECTIVE FINDINGS & SIGNIFICANT EVENTS    Resting in bed, periods of forgetfulness and confusion.  Coronavirus results pending Postoperative day 1-mild pain  PAST MEDICAL HISTORY   Past Medical History:  Diagnosis Date  . Cancer ([place])    Pt reports on 10/02/17 that he has never been dx with cancer  . COPD (chronic obstructive pulmonary disease) ([place])   . Diabetes mellitus without complication ([place])   . GERD (gastroesophageal reflux disease)   . Hypertension   . Multiple duodenal ulcers   . Peripheral vascular disease (HCC)    Peripheral Neuropathy  . Retinopathy      SURGICAL HISTORY   Past Surgical History:  Procedure Laterality Date  . ANKLE ARTHROPLASTY     2013  . COLONOSCOPY WITH PROPOFOL N/A 10/12/2016   Procedure: COLONOSCOPY WITH PROPOFOL;  Surgeon: Manya Silvas, MD;  Location: [facility] ENDOSCOPY;  Service: Endoscopy;  Laterality: N/A;     FAMILY HISTORY   Family History  Problem Relation Age of Onset  . Stroke Mother   . Alzheimer's disease Father   . Diabetes Brother      SOCIAL HISTORY   Social History   Tobacco Use  . Smoking status: Former Smoker    Last attempt to quit: 10/21/2005    Years since quitting: 12.8  . Smokeless tobacco: Never Used  Substance Use Topics  . Alcohol use: Yes    Alcohol/week: 42.0 standard drinks    Types: 42 Cans of beer per week    Comment: patient states he drinks 1 quart of beer per day  . Drug use: No     MEDICATIONS   Current Medication:  Current Facility-Administered Medications:  .  acetaminophen (TYLENOL) tablet 1,000 mg, 1,000 mg, Oral, Q6H, Poggi, Marshall Cork, MD, 1,000 mg at 08/09/18 0527 .  acetaminophen (TYLENOL) tablet 325-650 mg, 325-650 mg, Oral, Q6H PRN, Poggi, Marshall Cork, MD .  amLODipine (NORVASC)  tablet 5 mg, 5 mg, Oral, Daily PRN, Poggi, Marshall Cork, MD .  B-complex with vitamin C tablet 1 tablet, 1 tablet, Oral, Daily, Poggi, Marshall Cork, MD, 1 tablet at 08/08/18 [phone] .  benzocaine (ORAJEL) 10 % mucosal gel, , Mouth/Throat, TID PRN, Awilda Bill, NP, 1 application at 02/24/50 0600 .  bisacodyl (DULCOLAX) suppository 10 mg, 10 mg, Rectal, Daily PRN, Poggi, Marshall Cork, MD .  carbidopa-levodopa (SINEMET CR) 50-200 MG per tablet controlled release 1 tablet, 1 tablet, Oral, QHS, Poggi, Marshall Cork, MD, 1 tablet at 08/08/18 2027 .  carbidopa-levodopa (SINEMET IR) 25-250 MG per tablet immediate release 1 tablet, 1 tablet, Oral, TID, Poggi, Marshall Cork, MD, 1 tablet at 08/08/18 2027 .  cefTRIAXone (ROCEPHIN) 1 g in sodium chloride 0.9 % 100 mL IVPB, 1 g, Intravenous, Q24H, Poggi, Marshall Cork, MD, Stopped at 08/08/18 2112 .  diphenhydrAMINE (BENADRYL) 12.5 MG/5ML elixir 12.5-25 mg, 12.5-25 mg, Oral, Q4H PRN, Poggi, Marshall Cork, MD .  docusate sodium (COLACE) capsule 100 mg, 100 mg, Oral, BID, Poggi, Marshall Cork, MD, 100 mg at 08/08/18 2027 .  heparin injection 5,000 Units, 5,000 Units, Subcutaneous, Q8H, Poggi, Marshall Cork, MD, 5,000 Units at 08/09/18 0258 .  hydrALAZINE (APRESOLINE) injection 5 mg, 5 mg, Intravenous, Q4H PRN, Poggi, Marshall Cork, MD .  HYDROmorphone (DILAUDID) injection 0.25-0.5 mg, 0.25-0.5 mg, Intravenous, Q2H PRN, Poggi, Marshall Cork, MD .  CBG monitoring, , , Q4H **AND** insulin aspart (novoLOG) injection 0-24 Units, 0-24 Units, Subcutaneous, Q4H, Poggi, Marshall Cork, MD,  8 Units at 08/08/18 2331 .  insulin detemir (LEVEMIR) injection 10 Units, 10 Units, Subcutaneous, QHS, Poggi, Marshall Cork, MD, 10 Units at 08/08/18 2027 .  magnesium hydroxide (MILK OF MAGNESIA) suspension 30 mL, 30 mL, Oral, Daily PRN, Poggi, Marshall Cork, MD .  metoCLOPramide (REGLAN) tablet 5-10 mg, 5-10 mg, Oral, Q8H PRN **OR** metoCLOPramide (REGLAN) injection 5-10 mg, 5-10 mg, Intravenous, Q8H PRN, Poggi, Marshall Cork, MD .  metoprolol succinate (TOPROL-XL) 24 hr tablet 50 mg,  50 mg, Oral, Daily, Poggi, Marshall Cork, MD, 50 mg at 08/08/18 3532 .  multivitamin with minerals tablet 1 tablet, 1 tablet, Oral, Daily, Poggi, Marshall Cork, MD, 1 tablet at 08/08/18 [phone] .  ondansetron (ZOFRAN) tablet 4 mg, 4 mg, Oral, Q6H PRN **OR** ondansetron (ZOFRAN) injection 4 mg, 4 mg, Intravenous, Q6H PRN, Poggi, Marshall Cork, MD .  oxyCODONE (Oxy IR/ROXICODONE) immediate release tablet 5-10 mg, 5-10 mg, Oral, Q4H PRN, Poggi, Marshall Cork, MD, 10 mg at 08/09/18 0904 .  pantoprazole (PROTONIX) EC tablet 80 mg, 80 mg, Oral, Daily, Poggi, Marshall Cork, MD, 80 mg at 08/08/18 2683 .  sodium phosphate (FLEET) 7-19 GM/118ML enema 1 enema, 1 enema, Rectal, Once PRN, Poggi, Marshall Cork, MD .  thiamine (VITAMIN B-1) tablet 100 mg, 100 mg, Oral, Daily, Poggi, Marshall Cork, MD, 100 mg at 08/08/18 4196 .  traMADol (ULTRAM) tablet 50 mg, 50 mg, Oral, Q6H PRN, Poggi, Marshall Cork, MD, 50 mg at 08/08/18 2054    ALLERGIES   Penicillins    REVIEW OF SYSTEMS    Unable to obtain due to coronavirus precautions  PHYSICAL EXAMINATION   Vitals:   08/09/18 0400 08/09/18 0500  BP:  129/64  Pulse: 78 78  Resp: 16 14  Temp:    SpO2: 98% 93%    GENERAL: Mild distress due to postop pain HEAD: Normocephalic, atraumatic.  EYES: Equal round symmetric opening spontaneous MOUTH: Moist mucosal membrane. NECK: No grossly visible lesion PULMONARY: Nontachypneic CARDIOVASCULAR: Normal sinus rhythm per telemetry GASTROINTESTINAL: Nondistended MUSCULOSKELETAL: Nonedematous and ankles NEUROLOGIC: Mild distress due to pain and discomfort SKIN:intact   LABS AND IMAGING     LAB RESULTS: Recent Labs  Lab 08/07/18 2202 08/08/18 0206 08/09/18 0519  NA 126* 126* 132*  K 3.9 4.0 3.9  CL 94* 95* 99  CO2 20* 22 24  BUN 26* 25* 17  CREATININE 1.52* 1.39* 0.94  GLUCOSE 179* 123* 87   Recent Labs  Lab 08/07/18 1506 08/08/18 0555 08/09/18 0519  HGB 10.9* 8.7* 8.2*  HCT 32.9* 25.6* 24.9*  WBC 15.3* 14.8* 10.9*  PLT 423* 339 283      IMAGING RESULTS: Dg Hip Operative Unilat W Or W/o Pelvis Left  Result Date: 08/08/2018 CLINICAL DATA:  Left hip intramedullary nail. EXAM: OPERATIVE left HIP (WITH PELVIS IF PERFORMED) 4 VIEWS TECHNIQUE: Fluoroscopic spot image(s) were submitted for interpretation post-operatively. COMPARISON:  CT 08/07/2018 FINDINGS: Exam demonstrates placement of a left femoral intramedullary nail with associated screw bridging patient's intertrochanteric fracture into the femoral head as hardware is intact. There is anatomic alignment over the fracture site. IMPRESSION: Left intramedullary nail with associated screw bridging patient's intertrochanteric fracture with hardware intact and anatomic alignment over the fracture site. Electronically Signed   By: Marin Olp M.D.   On: 08/08/2018 19:26      ASSESSMENT AND PLAN   -Multidisciplinary rounds held today  Acute left hip fracture    S/p orthopedic evaluation -reduction internal fixation of closed and displaced four-part inter-trochanteric left hip fracture.  Diabetic ketoacidosis    - Follow phase I and II DKA protocol     -Resolved now   Renal Failure-most likely due to ATN -follow chem 7 -follow UO -continue Foley Catheter-assess need daily   Hypocalcemia            Likely related to hypoalbuminemia - will evaluate for protein calorie malnutrition   Sepsis likely due to Klebsiella UTI     - on empiric vanc/cefepime- narrow to rocephin per pharmD - appreciate input -nasal MRSA + - blood cx + MRSE -likely contaminant -Procal elevated 2.34, lactate now normal initially >7 -follow ABG and LA -follow up cultures   ID -continue IV abx as prescibed -follow up cultures  GI/Nutrition GI PROPHYLAXIS as indicated DIET-->TF's as tolerated Constipation protocol as indicated  ENDO - ICU hypoglycemic\Hyperglycemia protocol -check FSBS per protocol   ELECTROLYTES -follow labs as needed -replace as needed -pharmacy  consultation   DVT/GI PRX ordered -SCDs  TRANSFUSIONS AS NEEDED MONITOR FSBS ASSESS the need for LABS as needed   Critical care provider statement:   Critical care time (minutes): 32  Critical care time was exclusive of: Separately billable procedures and treating other patients  Critical care was necessary to treat or prevent imminent or life-threatening deterioration of the following conditions: Acute hip fracture, DKA, AKI , sepsis due to klebsiella UTI, multiple comorbid conditions.   Critical care was time spent personally by me on the following activities: Development of treatment plan with patient or surrogate, discussions with consultants, evaluation of patient's response to treatment, examination of patient, obtaining history from patient or surrogate, ordering and performing treatments and interventions, ordering and review of laboratory studies and re-evaluation of patient's condition.  I assumed direction of critical care for this patient from another provider in my specialty: no   This document was prepared using Dragon voice recognition software and may include unintentional dictation errors.    Ottie Glazier, M.D.  Division of [place]

## 2018-08-09 NOTE — Progress Notes (Signed)
Patient called out for pain medicine. RN asked where the pain was located and patient pointed to left breast, stating the skin was tender to touch, and his left cheek where he has a open sore from accidentally biting it a few days back. When assess the sites, the cheek and left breast/arm look slightly swollen without any edema. Maintenance fluids stopped, lung sounds auscultated and lung sounds clear bilaterally. NP Blakeney notified and new orders place.

## 2018-08-10 ENCOUNTER — Other Ambulatory Visit: Payer: Self-pay

## 2018-08-10 ENCOUNTER — Encounter (HOSPITAL_COMMUNITY): Payer: Self-pay

## 2018-08-10 ENCOUNTER — Inpatient Hospital Stay (HOSPITAL_COMMUNITY)
Admission: AD | Admit: 2018-08-10 | Discharge: 2018-08-16 | DRG: 178 | Disposition: A | Discharge status: Home Health | Payer: BLUE CROSS/BLUE SHIELD | Source: Other Acute Inpatient Hospital | Attending: Internal Medicine | Admitting: Internal Medicine

## 2018-08-10 DIAGNOSIS — Z79899 Other long term (current) drug therapy: Secondary | ICD-10-CM | POA: Diagnosis not present

## 2018-08-10 DIAGNOSIS — Z823 Family history of stroke: Secondary | ICD-10-CM

## 2018-08-10 DIAGNOSIS — Z87891 Personal history of nicotine dependence: Secondary | ICD-10-CM | POA: Diagnosis not present

## 2018-08-10 DIAGNOSIS — Z8711 Personal history of peptic ulcer disease: Secondary | ICD-10-CM | POA: Diagnosis not present

## 2018-08-10 DIAGNOSIS — Z820 Family history of epilepsy and other diseases of the nervous system: Secondary | ICD-10-CM

## 2018-08-10 DIAGNOSIS — Z794 Long term (current) use of insulin: Secondary | ICD-10-CM | POA: Diagnosis not present

## 2018-08-10 DIAGNOSIS — S72142D Displaced intertrochanteric fracture of left femur, subsequent encounter for closed fracture with routine healing: Secondary | ICD-10-CM

## 2018-08-10 DIAGNOSIS — Z96669 Presence of unspecified artificial ankle joint: Secondary | ICD-10-CM | POA: Diagnosis present

## 2018-08-10 DIAGNOSIS — I10 Essential (primary) hypertension: Secondary | ICD-10-CM | POA: Diagnosis not present

## 2018-08-10 DIAGNOSIS — Z960 Presence of urogenital implants: Secondary | ICD-10-CM

## 2018-08-10 DIAGNOSIS — S72002A Fracture of unspecified part of neck of left femur, initial encounter for closed fracture: Secondary | ICD-10-CM

## 2018-08-10 DIAGNOSIS — Z8781 Personal history of (healed) traumatic fracture: Secondary | ICD-10-CM

## 2018-08-10 DIAGNOSIS — E1051 Type 1 diabetes mellitus with diabetic peripheral angiopathy without gangrene: Secondary | ICD-10-CM | POA: Diagnosis not present

## 2018-08-10 DIAGNOSIS — R05 Cough: Secondary | ICD-10-CM

## 2018-08-10 DIAGNOSIS — W19XXXA Unspecified fall, initial encounter: Secondary | ICD-10-CM

## 2018-08-10 DIAGNOSIS — E871 Hypo-osmolality and hyponatremia: Secondary | ICD-10-CM | POA: Diagnosis not present

## 2018-08-10 DIAGNOSIS — G20 Parkinson's disease: Secondary | ICD-10-CM | POA: Diagnosis not present

## 2018-08-10 DIAGNOSIS — L97521 Non-pressure chronic ulcer of other part of left foot limited to breakdown of skin: Secondary | ICD-10-CM | POA: Diagnosis not present

## 2018-08-10 DIAGNOSIS — D62 Acute posthemorrhagic anemia: Secondary | ICD-10-CM | POA: Diagnosis present

## 2018-08-10 DIAGNOSIS — J449 Chronic obstructive pulmonary disease, unspecified: Secondary | ICD-10-CM | POA: Diagnosis not present

## 2018-08-10 DIAGNOSIS — Z833 Family history of diabetes mellitus: Secondary | ICD-10-CM | POA: Diagnosis not present

## 2018-08-10 DIAGNOSIS — Z880 Allergy status to penicillin: Secondary | ICD-10-CM | POA: Diagnosis not present

## 2018-08-10 DIAGNOSIS — K219 Gastro-esophageal reflux disease without esophagitis: Secondary | ICD-10-CM | POA: Diagnosis present

## 2018-08-10 DIAGNOSIS — E10319 Type 1 diabetes mellitus with unspecified diabetic retinopathy without macular edema: Secondary | ICD-10-CM | POA: Diagnosis not present

## 2018-08-10 DIAGNOSIS — R739 Hyperglycemia, unspecified: Secondary | ICD-10-CM

## 2018-08-10 DIAGNOSIS — Z7289 Other problems related to lifestyle: Secondary | ICD-10-CM

## 2018-08-10 DIAGNOSIS — R509 Fever, unspecified: Secondary | ICD-10-CM

## 2018-08-10 LAB — BASIC METABOLIC PANEL
Anion gap: 6 (ref 5–15)
BUN: 12 mg/dL (ref 8–23)
CO2: 26 mmol/L (ref 22–32)
Calcium: 8 mg/dL — ABNORMAL LOW (ref 8.9–10.3)
Chloride: 100 mmol/L (ref 98–111)
Creatinine, Ser: 0.91 mg/dL (ref 0.61–1.24)
GFR calc Af Amer: >60 mL/min (ref >60)
GFR calc non Af Amer: >60 mL/min (ref >60)
Glucose, Bld: 131 mg/dL — ABNORMAL HIGH (ref 70–99)
Potassium: 4.2 mmol/L (ref 3.5–5.1)
Sodium: 132 mmol/L — ABNORMAL LOW (ref 135–145)

## 2018-08-10 LAB — CULTURE, BLOOD (ROUTINE X 2)

## 2018-08-10 LAB — GLUCOSE, CAPILLARY
Glucose-Capillary: 110 mg/dL — ABNORMAL HIGH (ref 70–99)
Glucose-Capillary: 180 mg/dL — ABNORMAL HIGH (ref 70–99)
Glucose-Capillary: 269 mg/dL — ABNORMAL HIGH (ref 70–99)
Glucose-Capillary: 214 mg/dL — ABNORMAL HIGH (ref 70–99)

## 2018-08-10 LAB — PROCALCITONIN: Procalcitonin: 0.4 ng/mL

## 2018-08-10 MED ORDER — MAGNESIUM HYDROXIDE 400 MG/5ML PO SUSP: 30.0000 mL | Freq: Every day | ORAL | 0 refills | Status: DC | PRN

## 2018-08-10 MED ORDER — DOCUSATE SODIUM 100 MG PO CAPS: 100.0000 mg | ORAL_CAPSULE | Freq: Two times a day (BID) | ORAL | 0 refills | Status: DC

## 2018-08-10 MED ORDER — ENOXAPARIN SODIUM 40 MG/0.4ML ~~LOC~~ SOLN: 40.0000 mg | SUBCUTANEOUS | 0 refills | Status: DC

## 2018-08-10 MED ORDER — CEFAZOLIN SODIUM-DEXTROSE 1-4 GM/50ML-% IV SOLN: 1.0000 g | Freq: Three times a day (TID) | INTRAVENOUS | 0 refills | Status: DC

## 2018-08-10 MED ORDER — TRAMADOL HCL 50 MG PO TABS: 50.0000 mg | ORAL_TABLET | Freq: Four times a day (QID) | ORAL | 0 refills | Status: DC | PRN

## 2018-08-10 MED ORDER — VITAMIN B-1 100 MG PO TABS
100.0000 mg | ORAL_TABLET | Freq: Every day | ORAL | Status: DC
  Administered 2018-08-11 – 2018-08-16 (×6): 100 mg via ORAL
  Filled 2018-08-10 (×6): qty 1

## 2018-08-10 MED ORDER — TRAMADOL HCL 50 MG PO TABS: 50.0000 mg | ORAL_TABLET | Freq: Four times a day (QID) | ORAL | Status: DC | PRN

## 2018-08-10 MED ORDER — INSULIN DETEMIR 100 UNIT/ML ~~LOC~~ SOLN
10.0000 [IU] | Freq: Every day | SUBCUTANEOUS | Status: DC
  Administered 2018-08-10: 10 [IU] via SUBCUTANEOUS
  Filled 2018-08-10 (×2): qty 0.1

## 2018-08-10 MED ORDER — ADULT MULTIVITAMIN W/MINERALS CH
1.0000 | ORAL_TABLET | Freq: Every day | ORAL | Status: DC
  Administered 2018-08-11 – 2018-08-16 (×6): 1 via ORAL
  Filled 2018-08-10 (×6): qty 1

## 2018-08-10 MED ORDER — PANTOPRAZOLE SODIUM 40 MG PO TBEC
40.0000 mg | DELAYED_RELEASE_TABLET | Freq: Every day | ORAL | Status: DC
  Administered 2018-08-11 – 2018-08-16 (×6): 40 mg via ORAL
  Filled 2018-08-10 (×6): qty 1

## 2018-08-10 MED ORDER — INSULIN DETEMIR 100 UNIT/ML ~~LOC~~ SOLN: 10.0000 [IU] | Freq: Every day | SUBCUTANEOUS | 11 refills | Status: DC

## 2018-08-10 MED ORDER — METOPROLOL SUCCINATE ER 50 MG PO TB24
50.0000 mg | ORAL_TABLET | Freq: Every day | ORAL | Status: DC
  Administered 2018-08-11 – 2018-08-16 (×6): 50 mg via ORAL
  Filled 2018-08-10 (×6): qty 1

## 2018-08-10 MED ORDER — INSULIN ASPART 100 UNIT/ML ~~LOC~~ SOLN: 0.0000 [IU] | Freq: Three times a day (TID) | SUBCUTANEOUS | 11 refills | Status: DC

## 2018-08-10 MED ORDER — OXYCODONE HCL 5 MG PO TABS
5.0000 mg | ORAL_TABLET | ORAL | Status: DC | PRN
  Administered 2018-08-10: 10 mg via ORAL
  Administered 2018-08-10: 5 mg via ORAL
  Administered 2018-08-11 – 2018-08-15 (×17): 10 mg via ORAL
  Administered 2018-08-15: 5 mg via ORAL
  Administered 2018-08-15 – 2018-08-16 (×5): 10 mg via ORAL
  Filled 2018-08-10 (×3): qty 2
  Filled 2018-08-10: qty 1
  Filled 2018-08-10 (×22): qty 2

## 2018-08-10 MED ORDER — INSULIN ASPART 100 UNIT/ML ~~LOC~~ SOLN
0.0000 [IU] | Freq: Three times a day (TID) | SUBCUTANEOUS | Status: DC
  Administered 2018-08-10: 5 [IU] via SUBCUTANEOUS
  Administered 2018-08-11: 1 [IU] via SUBCUTANEOUS
  Administered 2018-08-11: 12:00:00 2 [IU] via SUBCUTANEOUS
  Administered 2018-08-11: 3 [IU] via SUBCUTANEOUS
  Administered 2018-08-12 (×2): 5 [IU] via SUBCUTANEOUS
  Administered 2018-08-12 – 2018-08-14 (×4): 3 [IU] via SUBCUTANEOUS
  Administered 2018-08-14: 10:00:00 2 [IU] via SUBCUTANEOUS
  Administered 2018-08-14: 18:00:00 7 [IU] via SUBCUTANEOUS
  Administered 2018-08-15: 17:00:00 3 [IU] via SUBCUTANEOUS
  Administered 2018-08-15: 7 [IU] via SUBCUTANEOUS
  Administered 2018-08-16: 09:00:00 1 [IU] via SUBCUTANEOUS

## 2018-08-10 MED ORDER — SENNOSIDES-DOCUSATE SODIUM 8.6-50 MG PO TABS
2.0000 | ORAL_TABLET | Freq: Two times a day (BID) | ORAL | Status: DC
  Administered 2018-08-10 – 2018-08-12 (×4): 2 via ORAL
  Filled 2018-08-10 (×5): qty 2

## 2018-08-10 MED ORDER — ENOXAPARIN SODIUM 40 MG/0.4ML ~~LOC~~ SOLN: 40.0000 mg | SUBCUTANEOUS | Status: DC

## 2018-08-10 MED ORDER — CHLORHEXIDINE GLUCONATE CLOTH 2 % EX PADS: 6.0000 | MEDICATED_PAD | Freq: Every day | CUTANEOUS | 0 refills | Status: DC

## 2018-08-10 MED ORDER — ACETAMINOPHEN 325 MG PO TABS: 650.0000 mg | ORAL_TABLET | Freq: Four times a day (QID) | ORAL | Status: DC | PRN

## 2018-08-10 MED ORDER — ONDANSETRON HCL 4 MG/2ML IJ SOLN: 4.0000 mg | Freq: Four times a day (QID) | INTRAMUSCULAR | Status: DC | PRN

## 2018-08-10 MED ORDER — CARBIDOPA-LEVODOPA ER 50-200 MG PO TBCR
1.0000 | EXTENDED_RELEASE_TABLET | Freq: Every day | ORAL | Status: DC
  Administered 2018-08-10 – 2018-08-15 (×6): 1 via ORAL
  Filled 2018-08-10 (×6): qty 1

## 2018-08-10 MED ORDER — MUPIROCIN 2 % EX OINT: 1.0000 "application " | TOPICAL_OINTMENT | Freq: Two times a day (BID) | CUTANEOUS | 0 refills | Status: DC

## 2018-08-10 MED ORDER — OXYCODONE HCL 5 MG PO TABS: 5.0000 mg | ORAL_TABLET | ORAL | 0 refills | Status: DC | PRN

## 2018-08-10 MED ORDER — CEFAZOLIN SODIUM-DEXTROSE 1-4 GM/50ML-% IV SOLN
1.0000 g | Freq: Three times a day (TID) | INTRAVENOUS | Status: DC
  Administered 2018-08-10 – 2018-08-12 (×6): 1 g via INTRAVENOUS
  Filled 2018-08-10 (×7): qty 50

## 2018-08-10 MED ORDER — ENOXAPARIN SODIUM 40 MG/0.4ML ~~LOC~~ SOLN
40.0000 mg | SUBCUTANEOUS | Status: DC
  Administered 2018-08-10 – 2018-08-15 (×6): 40 mg via SUBCUTANEOUS
  Filled 2018-08-10 (×6): qty 0.4

## 2018-08-10 MED ORDER — FOLIC ACID 1 MG PO TABS
1.0000 mg | ORAL_TABLET | Freq: Every day | ORAL | Status: DC
  Administered 2018-08-11 – 2018-08-16 (×6): 1 mg via ORAL
  Filled 2018-08-10 (×6): qty 1

## 2018-08-10 MED ORDER — SUPER B-COMPLEX PO TABS: 1.0000 | ORAL_TABLET | Freq: Every day | ORAL | Status: DC

## 2018-08-10 MED ORDER — DOCUSATE SODIUM 100 MG PO CAPS
100.0000 mg | ORAL_CAPSULE | Freq: Two times a day (BID) | ORAL | Status: DC
  Administered 2018-08-10 – 2018-08-12 (×4): 100 mg via ORAL
  Filled 2018-08-10 (×4): qty 1

## 2018-08-10 MED ORDER — AMLODIPINE BESYLATE 5 MG PO TABS: 5.0000 mg | ORAL_TABLET | Freq: Every day | ORAL | Status: DC | PRN

## 2018-08-10 MED ORDER — INSULIN ASPART 100 UNIT/ML ~~LOC~~ SOLN: 0.0000 [IU] | Freq: Three times a day (TID) | SUBCUTANEOUS | Status: DC

## 2018-08-10 MED ORDER — ENALAPRIL MALEATE 5 MG PO TABS
5.0000 mg | ORAL_TABLET | Freq: Every day | ORAL | Status: DC
  Filled 2018-08-10: qty 1

## 2018-08-10 MED ORDER — CARBIDOPA-LEVODOPA 25-250 MG PO TABS
1.0000 | ORAL_TABLET | Freq: Three times a day (TID) | ORAL | Status: DC
  Administered 2018-08-10 – 2018-08-16 (×17): 1 via ORAL
  Filled 2018-08-10 (×19): qty 1

## 2018-08-10 NOTE — Discharge Instructions (Signed)
Weight bearing as tolerated on left side per ortho. Need to remove staples on left at surgical site around 10th April.

## 2018-08-10 NOTE — Progress Notes (Signed)
ID SARS cov 2 positive .Chart reviewed and spoke to the patient on the phone.   Pt was admitted on 3/29 after a fall and diagnosed with fracture of left hip and underwent ORIF on 3/30 He had a fever on admission and hence Sars COV test was ordered by hospitalist. As per patient he says he has parkinsons and is shaky at times and when he walked to the bathroom that night he fell . He does not remember what happened afterwards. He also drank 80 ounce of beer that night. He had been checking his temperature every day as his wife had cough and cold and his temp was okay 97.1. (visitors from [place] a few days ago  one of whom is sick now)  In the ED his temp was 101.1, He also had Blood glucose of 507 and Na of 122 which could have explained his confusion Now he feels fine He has no fever No cough No sob Is on RA and pulse ox is 100% Vitals:   08/10/18 0402 08/10/18 0850  BP: (!) 157/73 (!) 166/78  Pulse: 82 85  Resp:  20  Temp: 97.8 F (36.6 C) 98.2 F (36.8 C)  SpO2: 100% 100%   CMP Latest Ref Rng & Units 08/10/2018 08/09/2018 08/08/2018  Glucose 70 - 99 mg/dL 131(H) 87 123(H)  BUN 8 - 23 mg/dL 12 17 25(H)  Creatinine 0.61 - 1.24 mg/dL 0.91 0.94 1.39(H)  Sodium 135 - 145 mmol/L 132(L) 132(L) 126(L)  Potassium 3.5 - 5.1 mmol/L 4.2 3.9 4.0  Chloride 98 - 111 mmol/L 100 99 95(L)  CO2 22 - 32 mmol/L 26 24 22   Calcium 8.9 - 10.3 mg/dL 8.0(L) 7.9(L) 7.7(L)  Total Protein 6.5 - 8.1 g/dL - 5.7(L) -  Total Bilirubin 0.3 - 1.2 mg/dL - 0.5 -  Alkaline Phos 38 - 126 U/L - 82 -  AST 15 - 41 U/L - 44(H) -  ALT 0 - 44 U/L - 6 -   CBC Latest Ref Rng & Units 08/09/2018 08/08/2018 08/07/2018  WBC 4.0 - 10.5 K/uL 10.9(H) 14.8(H) 15.3(H)  Hemoglobin 13.0 - 17.0 g/dL 8.2(L) 8.7(L) 10.9(L)  Hematocrit 39.0 - 52.0 % 24.9(L) 25.6(L) 32.9(L)  Platelets 150 - 400 K/uL 283 339 423(H)     Impression Fever on presentation- none since- ? Due to the fracture ? NCV  Hyponatremia _due to hyperglycemia ,   Alcohol- could be the cuase for his confusion Improved  Positive novel corona virus - currently asymptomatic- no fever or cough and 100% pulse ox on RA  Fall - was it due to parkisons or was he getting ill from the above Left hip fracture- ORIF on 3/30  Most important is that he  will need Physical therapy for the left hip /ORIF  Klebsiella in the urine- pt does not have any urinary symptoms Has condom catheter Currently on cefazolin for it - day 3 of antibiotic- would be able to DC on 4/3  Recommend post void bladder scan to look for any incomplete emptying.  Plan to transfer patient to [facility] as per [facility] system policy

## 2018-08-10 NOTE — H&P (Signed)
History and Physical  John Reilly YHC:[id number] DOB: [birth year] DOA: 08/10/2018  Referring physician: Dr Anselm Jungling PCP: Glendon Axe, MD  Outpatient Specialists: None Patient coming from: [facility]  Chief Complaint: Positive COVID-19 with recent left hip fracture post repair  HPI: John Reilly is a 63 y.o. male with medical history significant for Parkinson disease, type 1 diabetes who presented to [facility] as a transfer from [facility] due to a positive COVID-19 test.  Patient was recently admitted at [facility] on 08/07/2018 after a mechanical fall resulting in left hip fracture.  He had a left hip repair done on 08/08/2018.  Hospital course complicated by DKA for which he was treated.  Admits to intermittent dry cough.  Positive COVID-19.  No improving or worsening factors.  States his wife had a cold in the last week but has not been tested.  No other known exposures.  TRH asked to admit at [facility], in dedicated COVID-19 unit.   Review of Systems: Review of systems as noted in the HPI. All other systems reviewed and are negative.   Past Medical History:  Diagnosis Date  . Cancer ([place])    Pt reports on 10/02/17 that he has never been dx with cancer  . COPD (chronic obstructive pulmonary disease) ([place])   . Diabetes mellitus without complication ([place])   . GERD (gastroesophageal reflux disease)   . Hypertension   . Multiple duodenal ulcers   . Peripheral vascular disease (HCC)    Peripheral Neuropathy  . Retinopathy    Past Surgical History:  Procedure Laterality Date  . ANKLE ARTHROPLASTY     2013  . COLONOSCOPY WITH PROPOFOL N/A 10/12/2016   Procedure: COLONOSCOPY WITH PROPOFOL;  Surgeon: Manya Silvas, MD;  Location: [facility] ENDOSCOPY;  Service: Endoscopy;  Laterality: N/A;  . INTRAMEDULLARY (IM) NAIL INTERTROCHANTERIC Left 08/08/2018   Procedure: INTRAMEDULLARY (IM) NAIL INTERTROCHANTRIC;  Surgeon: Corky Mull, MD;  Location: [facility] ORS;   Service: Orthopedics;  Laterality: Left;    Social History:  reports that he quit smoking about 12 years ago. He has never used smokeless tobacco. He reports current alcohol use of about 42.0 standard drinks of alcohol per week. He reports that he does not use drugs.   Allergies  Allergen Reactions  . Penicillins Rash    Has patient had a PCN reaction causing immediate rash, facial/tongue/throat swelling, SOB or lightheadedness with hypotension: Yes Has patient had a PCN reaction causing severe rash involving mucus membranes or skin necrosis: No Has patient had a PCN reaction that required hospitalization: No Has patient had a PCN reaction occurring within the last 10 years: No If all of the above answers are "NO", then may proceed with Cephalosporin use.    Family History  Problem Relation Age of Onset  . Stroke Mother   . Alzheimer's disease Father   . Diabetes Brother       Prior to Admission medications   Medication Sig Start Date End Date Taking? Authorizing Provider  acetaminophen (TYLENOL) 325 MG tablet Take 2 tablets (650 mg total) by mouth every 4 (four) hours as needed for moderate pain (or Fever >/= 101). 10/04/17   Max Sane, MD  amLODipine (NORVASC) 5 MG tablet Take 1 tablet by mouth daily as needed (BP OVER 160/90).  07/22/17   [provider]  B Complex-Biotin-FA (SUPER B-COMPLEX) TABS Take 1 tablet by mouth daily.    [provider]  carbidopa-levodopa (SINEMET CR) 50-200 MG tablet Take 1  tablet by mouth Nightly. 07/28/18 10/26/18  [provider]  carbidopa-levodopa (SINEMET IR) 25-250 MG tablet Take 1 tablet by mouth 3 (three) times daily. 07/28/18 08/27/18  [provider]  ceFAZolin (ANCEF) 1-4 GM/50ML-% SOLN Inject 50 mLs (1 g total) into the vein every 8 (eight) hours for 3 days. For UTI 08/10/18 08/13/18  Vaughan Basta, MD  Chlorhexidine Gluconate Cloth 2 % PADS Apply 6 each topically daily at 6 (six) AM. 08/11/18   Vaughan Basta, MD  docusate sodium (COLACE) 100 MG capsule Take 1 capsule (100 mg total) by mouth 2 (two) times daily. 08/10/18   Vaughan Basta, MD  enalapril (VASOTEC) 5 MG tablet Take 5 mg by mouth daily. 08/02/18   [provider]  enoxaparin (LOVENOX) 40 MG/0.4ML injection Inject 0.4 mLs (40 mg total) into the skin daily for 14 days. 08/10/18 08/24/18  Vaughan Basta, MD  folic acid (FOLVITE) 1 MG tablet Take 1 mg by mouth daily.    [provider]  insulin aspart (NOVOLOG) 100 UNIT/ML injection Inject 0-24 Units into the skin 3 (three) times daily with meals. 08/10/18   Vaughan Basta, MD  insulin detemir (LEVEMIR) 100 UNIT/ML injection Inject 0.1 mLs (10 Units total) into the skin at bedtime. 08/10/18   Vaughan Basta, MD  magnesium hydroxide (MILK OF MAGNESIA) 400 MG/5ML suspension Take 30 mLs by mouth daily as needed for mild constipation. 08/10/18   Vaughan Basta, MD  metoprolol succinate (TOPROL-XL) 50 MG 24 hr tablet Take 1 tablet (50 mg total) by mouth daily. Take with or immediately following a meal. 10/20/17   Loletha Grayer, MD  Multiple Vitamin (MULTIVITAMIN WITH MINERALS) TABS tablet Take 1 tablet by mouth daily.    [provider]  mupirocin ointment (BACTROBAN) 2 % Place 1 application into the nose 2 (two) times daily. 08/10/18   Vaughan Basta, MD  omeprazole (PRILOSEC) 40 MG capsule Take 40 mg by mouth daily.    [provider]  oxyCODONE (OXY IR/ROXICODONE) 5 MG immediate release tablet Take 1-2 tablets (5-10 mg total) by mouth every 4 (four) hours as needed for severe pain (pain score 4-6). 08/10/18   Vaughan Basta, MD  thiamine 100 MG tablet Take 1 tablet (100 mg total) by mouth daily. 10/20/17   Loletha Grayer, MD  traMADol (ULTRAM) 50 MG tablet Take 1 tablet (50 mg total) by mouth every 6 (six) hours as needed for moderate pain. 08/10/18   Vaughan Basta, MD    Physical Exam: BP (!)  133/95 (BP Location: Right Arm)   Pulse (!) 102   Temp 99 F (37.2 C) (Axillary)   Resp 18   SpO2 100%   . General: 63 y.o. year-old male well developed well nourished in no acute distress.  Alert and oriented x3. . Cardiovascular: Regular rate and rhythm with no rubs or gallops.  No thyromegaly or JVD noted.  Trace lower extremity edema. 2/4 pulses in all 4 extremities. Marland Kitchen Respiratory: Clear to auscultation with no wheezes or rales. Good inspiratory effort. . Abdomen: Soft nontender nondistended with normal bowel sounds x4 quadrants. . Muskuloskeletal: No cyanosis or clubbing.  Trace edema noted bilaterally; left hip with surgical dressing appears clean with no drainage. . Neuro: CN II-XII intact, strength, sensation, reflexes . Skin: Hyperpigmentation noted in lower extremities bilaterally. Marland Kitchen Psychiatry: Judgement and insight appear normal. Mood is appropriate for condition and setting          Labs on Admission:  Basic Metabolic Panel: Recent Labs  Lab  08/07/18 1928 08/07/18 2202 08/08/18 0206 08/09/18 0519 08/10/18 0548  NA 124* 126* 126* 132* 132*  K 4.2 3.9 4.0 3.9 4.2  CL 91* 94* 95* 99 100  CO2 16* 20* 22 24 26   GLUCOSE 340* 179* 123* 87 131*  BUN 25* 26* 25* 17 12  CREATININE 1.77* 1.52* 1.39* 0.94 0.91  CALCIUM 7.9* 7.7* 7.7* 7.9* 8.0*   Liver Function Tests: Recent Labs  Lab 08/07/18 1506 08/09/18 0519  AST 56* 44*  ALT 16 6  ALKPHOS 112 82  BILITOT 1.7* 0.5  PROT 7.1 5.7*  ALBUMIN 3.6 2.6*   No results for input(s): LIPASE, AMYLASE in the last 168 hours. No results for input(s): AMMONIA in the last 168 hours. CBC: Recent Labs  Lab 08/07/18 1506 08/08/18 0555 08/09/18 0519  WBC 15.3* 14.8* 10.9*  NEUTROABS 13.9*  --  8.6*  HGB 10.9* 8.7* 8.2*  HCT 32.9* 25.6* 24.9*  MCV 95.4 92.4 95.4  PLT 423* 339 283   Cardiac Enzymes: Recent Labs  Lab 08/07/18 1506 08/08/18 0555  CKTOTAL 1,647* 904*    BNP (last 3 results) No results for input(s):  BNP in the last 8760 hours.  ProBNP (last 3 results) No results for input(s): PROBNP in the last 8760 hours.  CBG: Recent Labs  Lab 08/09/18 1215 08/09/18 1653 08/09/18 2110 08/10/18 0848 08/10/18 1229  GLUCAP 182* 125* 138* 110* 180*    Radiological Exams on Admission: Dg Hip Operative Unilat W Or W/o Pelvis Left  Result Date: 08/08/2018 CLINICAL DATA:  Left hip intramedullary nail. EXAM: OPERATIVE left HIP (WITH PELVIS IF PERFORMED) 4 VIEWS TECHNIQUE: Fluoroscopic spot image(s) were submitted for interpretation post-operatively. COMPARISON:  CT 08/07/2018 FINDINGS: Exam demonstrates placement of a left femoral intramedullary nail with associated screw bridging patient's intertrochanteric fracture into the femoral head as hardware is intact. There is anatomic alignment over the fracture site. IMPRESSION: Left intramedullary nail with associated screw bridging patient's intertrochanteric fracture with hardware intact and anatomic alignment over the fracture site. Electronically Signed   By: Marin Olp M.D.   On: 08/08/2018 19:26    EKG: I independently viewed the EKG done and my findings are as followed: None available at the time of this visit.  Assessment/Plan Present on Admission: **None**  Active Problems:   S/p left hip fracture  Left hip fracture post mechanical fall status post left hip repair, POA Presented at [facility] post mechanical fall with left hip fracture Left hip repair on 08/08/2018 Pain management in place Bowel regimen in place to avoid opiate-induced constipation PT recommended SNF at [facility] PT consult Fall precautions Continue DVT prophylaxis  Covid-19 infection, low risk Supportive care Droplet precautions Maintain O2 saturation greater than 92% Currently saturating 100% on room air  Recently treated DKA Type 1 diabetes Obtain A1c Resume insulin home regimen  Type 1 diabetes Management as stated above  Parkinson  disease Resume home medications  Recently treated UTI Resume antibiotics  Essential hypertension Signs are stable Resume home medications  Risks: High risk for decompensation due to recent surgery with left hip repair, newly diagnosed COVID-19 infection, recently treated DKA, multiple comorbidities and advanced age.  Patient will require at least 2 midnights for further evaluation and treatment of present condition.   DVT prophylaxis: Subcu Lovenox daily  Code Status: Full code  Family Communication: None at bedside  Disposition Plan: Admit to MedSurg  Consults called: None  Admission status: Inpatient status    Kayleen Memos MD [facility] Pager [phone]  If 7PM-7AM, please contact night-coverage [email or web address] Password [email or web address]  08/10/2018, 4:09 PM

## 2018-08-10 NOTE — Discharge Summary (Signed)
[facility] NAME: John Reilly    MR#:  [id number]  DATE OF BIRTH:  1955-08-22  DATE OF ADMISSION:  08/07/2018 ADMITTING PHYSICIAN: John Hua, MD  DATE OF DISCHARGE: 08/10/2018  PRIMARY CARE PHYSICIAN: John Axe, MD    ADMISSION DIAGNOSIS:  Fall [W19.XXXA] Closed fracture of left hip, initial encounter ([place]) [S72.002A] Traumatic rhabdomyolysis, initial encounter ([place]) [T79.6XXA] Altered mental status, unspecified altered mental status type [R41.82] Diabetic ketoacidosis without coma associated with other specified diabetes mellitus ([place]) [E13.10] Sepsis, due to unspecified organism, unspecified whether acute organ dysfunction present ([place]) [A41.9]  DISCHARGE DIAGNOSIS:  Active Problems:   DKA (diabetic ketoacidoses) ([place])   Altered mental status   Closed fracture of left hip ([place])   Fall   SECONDARY DIAGNOSIS:   Past Medical History:  Diagnosis Date  . Cancer ([place])    Pt reports on 10/02/17 that he has never been dx with cancer  . COPD (chronic obstructive pulmonary disease) ([place])   . Diabetes mellitus without complication ([place])   . GERD (gastroesophageal reflux disease)   . Hypertension   . Multiple duodenal ulcers   . Peripheral vascular disease ([facility])    Peripheral Neuropathy  . Retinopathy     HOSPITAL COURSE:   *DKA-resolved. He was using Insuline pump before admission, will advise to hold it for now.  Cont Glargine and sliding scale insuline.  *Hip fracture- s/p surgery by orthopedic.  Reduction and internal fixation of closed displaced 4-part intertrochanteric left hip fracture with Biomet Affixis TFN nail. On 08/08/18  initiate PT and plan for rehab.  ortho cleared for weight bearing as tolerated on left side now.  *Sepsis-UTI , COVID 19. suspected COVID-19-broad-spectrum antibiotics, cultures are sent,influenza negative. Urine culture reporting Klebsiella pneumoniae, currently on  antibiotics. Staphylococci in 1 blood sample seems to be contamination. COVID-19 result is positive. ID saw the pt, suggest to cont Abx for UTI until 08/13/18 Pt is on room air and clear chest xray. I informed his wife on phone, she had " flu like symptoms" last week, and was not tested or flu but told by PMD to stay home. She is exposed to her daughter and grand son while staying home. I advised her to talk to her PMD about husband's positive result and let her daughter and grand son know- they need to self isolate and if symptoms- may need to get tested.  *Acute kidney injury-monitor with IV fluids. Improved.  *Rhabdomyolysis-monitor with IV fluids.  Improved.  *Anemia-we may need to give him iron tablets on discharge. Monitor.  *Hyponatremia Improved with IV fluids and correction of DKA.  * Alcoholism- Give vitamin supplements, no withdrawal.  * parkinsons dz   Cont sinemet  DISCHARGE CONDITIONS:   Stable.  CONSULTS OBTAINED:  Treatment Team:  Corky Mull, MD  DRUG ALLERGIES:   Allergies  Allergen Reactions  . Penicillins Rash    Has patient had a PCN reaction causing immediate rash, facial/tongue/throat swelling, SOB or lightheadedness with hypotension: Yes Has patient had a PCN reaction causing severe rash involving mucus membranes or skin necrosis: No Has patient had a PCN reaction that required hospitalization: No Has patient had a PCN reaction occurring within the last 10 years: No If all of the above answers are "NO", then may proceed with Cephalosporin use.    DISCHARGE MEDICATIONS:   Allergies as of 08/10/2018      Reactions   Penicillins Rash   Has patient had  a PCN reaction causing immediate rash, facial/tongue/throat swelling, SOB or lightheadedness with hypotension: Yes Has patient had a PCN reaction causing severe rash involving mucus membranes or skin necrosis: No Has patient had a PCN reaction that required hospitalization: No Has patient had a  PCN reaction occurring within the last 10 years: No If all of the above answers are "NO", then may proceed with Cephalosporin use.      Medication List    STOP taking these medications   cephALEXin 500 MG capsule Commonly known as:  KEFLEX   glucagon 1 MG injection   Insulin Glargine 100 UNIT/ML Solostar Pen Commonly known as:  LANTUS   insulin lispro 100 UNIT/ML cartridge Commonly known as:  HumaLOG   oxyCODONE-acetaminophen 5-325 MG tablet Commonly known as:  PERCOCET/ROXICET     TAKE these medications   acetaminophen 325 MG tablet Commonly known as:  TYLENOL Take 2 tablets (650 mg total) by mouth every 4 (four) hours as needed for moderate pain (or Fever >/= 101).   amLODipine 5 MG tablet Commonly known as:  NORVASC Take 1 tablet by mouth daily as needed (BP OVER 160/90).   carbidopa-levodopa 50-200 MG tablet Commonly known as:  SINEMET CR Take 1 tablet by mouth Nightly.   carbidopa-levodopa 25-250 MG tablet Commonly known as:  SINEMET IR Take 1 tablet by mouth 3 (three) times daily.   ceFAZolin 1-4 GM/50ML-% Soln Commonly known as:  ANCEF Inject 50 mLs (1 g total) into the vein every 8 (eight) hours for 3 days. For UTI   Chlorhexidine Gluconate Cloth 2 % Pads Apply 6 each topically daily at 6 (six) AM. Start taking on:  August 11, 2018   docusate sodium 100 MG capsule Commonly known as:  COLACE Take 1 capsule (100 mg total) by mouth 2 (two) times daily.   enalapril 5 MG tablet Commonly known as:  VASOTEC Take 5 mg by mouth daily.   enoxaparin 40 MG/0.4ML injection Commonly known as:  LOVENOX Inject 0.4 mLs (40 mg total) into the skin daily for 14 days.   folic acid 1 MG tablet Commonly known as:  FOLVITE Take 1 mg by mouth daily.   insulin aspart 100 UNIT/ML injection Commonly known as:  novoLOG Inject 0-24 Units into the skin 3 (three) times daily with meals.   insulin detemir 100 UNIT/ML injection Commonly known as:  LEVEMIR Inject 0.1 mLs (10  Units total) into the skin at bedtime.   magnesium hydroxide 400 MG/5ML suspension Commonly known as:  MILK OF MAGNESIA Take 30 mLs by mouth daily as needed for mild constipation.   metoprolol succinate 50 MG 24 hr tablet Commonly known as:  TOPROL-XL Take 1 tablet (50 mg total) by mouth daily. Take with or immediately following a meal.   multivitamin with minerals Tabs tablet Take 1 tablet by mouth daily.   mupirocin ointment 2 % Commonly known as:  BACTROBAN Place 1 application into the nose 2 (two) times daily.   omeprazole 40 MG capsule Commonly known as:  PRILOSEC Take 40 mg by mouth daily.   oxyCODONE 5 MG immediate release tablet Commonly known as:  Oxy IR/ROXICODONE Take 1-2 tablets (5-10 mg total) by mouth every 4 (four) hours as needed for severe pain (pain score 4-6).   Super B-Complex Tabs Take 1 tablet by mouth daily.   thiamine 100 MG tablet Take 1 tablet (100 mg total) by mouth daily.   traMADol 50 MG tablet Commonly known as:  ULTRAM Take 1 tablet (50  mg total) by mouth every 6 (six) hours as needed for moderate pain.        DISCHARGE INSTRUCTIONS:    Follow with ortho clinic in 2 weeks, need to take staples out around 10th April.  If you experience worsening of your admission symptoms, develop shortness of breath, life threatening emergency, suicidal or homicidal thoughts you must seek medical attention immediately by calling 911 or calling your MD immediately  if symptoms less severe.  You Must read complete instructions/literature along with all the possible adverse reactions/side effects for all the Medicines you take and that have been prescribed to you. Take any new Medicines after you have completely understood and accept all the possible adverse reactions/side effects.   Please note  You were cared for by a hospitalist during your hospital stay. If you have any questions about your discharge medications or the care you received while you were  in the hospital after you are discharged, you can call the unit and asked to speak with the hospitalist on call if the hospitalist that took care of you is not available. Once you are discharged, your primary care physician will handle any further medical issues. Please note that NO REFILLS for any discharge medications will be authorized once you are discharged, as it is imperative that you return to your primary care physician (or establish a relationship with a primary care physician if you do not have one) for your aftercare needs so that they can reassess your need for medications and monitor your lab values.    Today   CHIEF COMPLAINT:   Chief Complaint  Patient presents with  . Fall  . Hyperglycemia    HISTORY OF PRESENT ILLNESS:  John Reilly  is a 63 y.o. male with a known history of hypertension, COPD, type 2 diabetes, Parkinson's who presented to the ED after a fall at home.  Per patient's wife, yesterday evening he started having high blood sugars.  He stated he was having trouble walking because his legs felt weak.  On his way back from the bathroom, he fell on the floor.  His wife was able to get him up and moved him to the couch in the den, where he typically sleeps at night.  She made sure he took his insulin, and he seemed to be fine the rest of the night.  This morning, she checked on him at 7 AM and he was doing fine.  She checked on him again at 12:30 PM, and found him laying on the floor.  He had no idea how he got there.  She called EMS.  In the ED, he was meeting sepsis criteria with fever, tachycardia, tachypnea, and leukocytosis.  Labs were significant for potassium 5.2, glucose 507, creatinine 1.58, anion gap 25, lactic acid 3.1, WBC 15.3.  CK was 1647.  Chest x-ray and UA did not show signs of an acute infection.  CT head, maxillofacial, and C-spine were unremarkable.  CT pelvis showed a left hip fracture.  Hospitalists were called for admission.   VITAL SIGNS:  Blood  pressure (!) 166/78, pulse 85, temperature 98.2 F (36.8 C), temperature source Oral, resp. rate 20, height 5\' 10"  (1.778 m), weight 77.2 kg, SpO2 100 %.  I/O:    Intake/Output Summary (Last 24 hours) at 08/10/2018 1234 Last data filed at 08/10/2018 0629 Gross per 24 hour  Intake 99.84 ml  Output 1170 ml  Net -1070.16 ml    PHYSICAL EXAMINATION:   GENERAL:  62  y.o.-year-old patient lying in the bed with no acute distress.  EYES: Pupils equal, round, reactive to light and accommodation. No scleral icterus. Extraocular muscles intact.  HEENT: Head atraumatic, normocephalic. Oropharynx and nasopharynx clear.  NECK:  Supple, no jugular venous distention. No thyroid enlargement, no tenderness.  LUNGS: Normal breath sounds bilaterally, no wheezing, rales,rhonchi or crepitation. No use of accessory muscles of respiration.  CARDIOVASCULAR: S1, S2 normal. No murmurs, rubs, or gallops.  ABDOMEN: Soft, nontender, nondistended. Bowel sounds present. No organomegaly or mass.  EXTREMITIES: No pedal edema, cyanosis, or clubbing.  NEUROLOGIC: Cranial nerves II through XII are intact. Muscle strength 5/5 in all extremities- except limited move on LLE due to recent surgery. Sensation intact. Gait not checked.  PSYCHIATRIC: The patient is alert and oriented x 3.  SKIN: No obvious rash, lesion, or ulcer.   DATA REVIEW:   CBC Recent Labs  Lab 08/09/18 0519  WBC 10.9*  HGB 8.2*  HCT 24.9*  PLT 283    Chemistries  Recent Labs  Lab 08/09/18 0519 08/10/18 0548  NA 132* 132*  K 3.9 4.2  CL 99 100  CO2 24 26  GLUCOSE 87 131*  BUN 17 12  CREATININE 0.94 0.91  CALCIUM 7.9* 8.0*  AST 44*  --   ALT 6  --   ALKPHOS 82  --   BILITOT 0.5  --     Cardiac Enzymes No results for input(s): TROPONINI in the last 168 hours.  Microbiology Results  Results for orders placed or performed during the hospital encounter of 08/07/18  Blood Culture (routine x 2)     Status: None (Preliminary result)    Collection Time: 08/07/18  3:07 PM  Result Value Ref Range Status   Specimen Description BLOOD LEFT ARM  Final   Special Requests   Final    BOTTLES DRAWN AEROBIC AND ANAEROBIC Blood Culture results may not be optimal due to an excessive volume of blood received in culture bottles   Culture   Final    NO GROWTH 3 DAYS Performed at [facility], [address] [place], [place] [address]    Report Status PENDING  Incomplete  Blood Culture (routine x 2)     Status: Abnormal   Collection Time: 08/07/18  3:07 PM  Result Value Ref Range Status   Specimen Description   Final    BLOOD RIGHT ARM Performed at [facility], [address], [place], [place] [address]    Special Requests   Final    BOTTLES DRAWN AEROBIC AND ANAEROBIC Blood Culture results may not be optimal due to an excessive volume of blood received in culture bottles Performed at [facility], [address], [place], [place] [address]    Culture  Setup Time   Final    [place] CRITICAL RESULT CALLED TO, READ BACK BY AND VERIFIED WITH: KAREN HAYES AT 1108 08/08/18 SDR    Culture (A)  Final    STAPHYLOCOCCUS SPECIES (COAGULASE NEGATIVE) THE SIGNIFICANCE OF ISOLATING THIS ORGANISM FROM A SINGLE SET OF BLOOD CULTURES WHEN MULTIPLE SETS ARE DRAWN IS UNCERTAIN. PLEASE NOTIFY THE MICROBIOLOGY DEPARTMENT WITHIN ONE WEEK IF SPECIATION AND SENSITIVITIES ARE REQUIRED. Performed at [facility], [place] [address] [place], [place] [address]    Report Status 08/10/2018 FINAL  Final  Urine culture     Status: Abnormal   Collection Time: 08/07/18  3:07 PM  Result Value Ref Range Status   Specimen  Description   Final    URINE, RANDOM Performed at [facility], [address] [place], [place] [address]    Special Requests   Final    NONE Performed at [facility], [place], [place] [address]    Culture 80,000 COLONIES/mL KLEBSIELLA  PNEUMONIAE (A)  Final   Report Status 08/09/2018 FINAL  Final   Organism ID, Bacteria KLEBSIELLA PNEUMONIAE (A)  Final      Susceptibility   Klebsiella pneumoniae - MIC*    AMPICILLIN >=32 RESISTANT Resistant     CEFAZOLIN <=4 SENSITIVE Sensitive     CEFTRIAXONE <=1 SENSITIVE Sensitive     CIPROFLOXACIN <=0.25 SENSITIVE Sensitive     GENTAMICIN <=1 SENSITIVE Sensitive     IMIPENEM <=0.25 SENSITIVE Sensitive     NITROFURANTOIN 32 SENSITIVE Sensitive     TRIMETH/SULFA <=20 SENSITIVE Sensitive     AMPICILLIN/SULBACTAM 8 SENSITIVE Sensitive     PIP/TAZO <=4 SENSITIVE Sensitive     Extended ESBL NEGATIVE Sensitive     * 80,000 COLONIES/mL KLEBSIELLA PNEUMONIAE  Blood Culture ID Panel (Reflexed)     Status: Abnormal   Collection Time: 08/07/18  3:07 PM  Result Value Ref Range Status   Enterococcus species NOT DETECTED NOT DETECTED Final   Listeria monocytogenes NOT DETECTED NOT DETECTED Final   Staphylococcus species DETECTED (A) NOT DETECTED Final    Comment: Methicillin (oxacillin) resistant coagulase negative staphylococcus. Possible blood culture contaminant (unless isolated from more than one blood culture draw or clinical case suggests pathogenicity). No antibiotic treatment is indicated for blood  culture contaminants. CRITICAL RESULT CALLED TO, READ BACK BY AND VERIFIED WITH:  KAREN HAYES AT 1108 08/08/18 SDR    Staphylococcus aureus (BCID) NOT DETECTED NOT DETECTED Final   Methicillin resistance DETECTED (A) NOT DETECTED Final    Comment: CRITICAL RESULT CALLED TO, READ BACK BY AND VERIFIED WITH:  KAREN HAYES AT 1108 08/08/18 SDR    Streptococcus species NOT DETECTED NOT DETECTED Final   Streptococcus agalactiae NOT DETECTED NOT DETECTED Final   Streptococcus pneumoniae NOT DETECTED NOT DETECTED Final   Streptococcus pyogenes NOT DETECTED NOT DETECTED Final   Acinetobacter baumannii NOT DETECTED NOT DETECTED Final   Enterobacteriaceae species NOT DETECTED NOT DETECTED Final    Enterobacter cloacae complex NOT DETECTED NOT DETECTED Final   Escherichia coli NOT DETECTED NOT DETECTED Final   Klebsiella oxytoca NOT DETECTED NOT DETECTED Final   Klebsiella pneumoniae NOT DETECTED NOT DETECTED Final   Proteus species NOT DETECTED NOT DETECTED Final   Serratia marcescens NOT DETECTED NOT DETECTED Final   Haemophilus influenzae NOT DETECTED NOT DETECTED Final   Neisseria meningitidis NOT DETECTED NOT DETECTED Final   Pseudomonas aeruginosa NOT DETECTED NOT DETECTED Final   Candida albicans NOT DETECTED NOT DETECTED Final   Candida glabrata NOT DETECTED NOT DETECTED Final   Candida krusei NOT DETECTED NOT DETECTED Final   Candida parapsilosis NOT DETECTED NOT DETECTED Final   Candida tropicalis NOT DETECTED NOT DETECTED Final    Comment: Performed at [facility], [address] [place], [place] [address]  Novel Coronavirus, NAA (hospital order; send-out to ref lab)     Status: Abnormal   Collection Time: 08/07/18  7:05 PM  Result Value Ref Range Status   SARS-CoV-2, NAA DETECTED (A) NOT DETECTED Final    Comment: Positive (Detected) results are indicative of active infection with SARS CoV 2. A positive result does not rule out bacterial infection or coinfection with other viruses.  Detection of SARS CoV 2 viral RNA may not indicate that SARS CoV 2 is the causative agent for clinical symptoms. Positive and negative predictive values of testing are highly dependent on prevalence. False positive test results are more likely when prevalence is moderate to low. CRITICAL RESULT CALLED TO, READ BACK BY AND VERIFIED WITH: Mariann Laster RN 1740 08/09/18 A [place] (NOTE) The expected result is Negative (Not Detected). The SARS CoV 2 test is intended for the presumptive qualitative  detection of nucleic acid from SARS CoV 2 in upper and lower  respiratory specimens. Testing methodology is real time RT PCR. Test results must be correlated with clinical presentation and   evaluated in the context of other laboratory and epidemiologic data.  Test performance can be affected because the epidemiology and   clinical spectrum of infection caused by SARS CoV 2 is not fully  known. For example, the optimum types of specimens to collect and  when during the course of infection these specimens are most likely  to contain detectable viral RNA may not be known. This test has not been Food and Drug Administration (FDA) cleared or  approved and has been authorized by FDA under an Emergency Use  Authorization (EUA). The test is only authorized for the duration of  the declaration that circumstances exist justifying the authorization  of emergency use of in vitro diagnostic tests for detection and or  diagnosis of SARS CoV 2 under Section 564(b)(1) of the Act, 21 U.S.C.  section [phone] 3(b)(1), unless the authorization is terminated or  revoked sooner. [facility] is certified under the  Clinical Laboratory Improvement Amendments of 1988 (CLIA), 42 U.S.C.  section [phone], to perform high complexity tests. Performed at [id number] [address], [place], [place] [address] Laboratory Director: Loleta Books, MD Performed at [facility], [place] [address] [place], [place] [address]    Coronavirus Source NASOPHARYNGEAL  Final    Comment: Performed at [facility], [address] [place], [place] [address]  Respiratory Panel by PCR     Status: None   Collection Time: 08/07/18  7:05 PM  Result Value Ref Range Status   Adenovirus NOT DETECTED NOT DETECTED Final   Coronavirus 229E NOT DETECTED NOT DETECTED Final    Comment: (NOTE) The Coronavirus on the Respiratory Panel, DOES NOT test for the novel  Coronavirus (2019 nCoV)    Coronavirus HKU1 NOT DETECTED NOT DETECTED Final   Coronavirus NL63 NOT DETECTED NOT DETECTED Final   Coronavirus OC43 NOT DETECTED NOT DETECTED Final    Metapneumovirus NOT DETECTED NOT DETECTED Final   Rhinovirus / Enterovirus NOT DETECTED NOT DETECTED Final   Influenza A NOT DETECTED NOT DETECTED Final   Influenza B NOT DETECTED NOT DETECTED Final   Parainfluenza Virus 1 NOT DETECTED NOT DETECTED Final   Parainfluenza Virus 2 NOT DETECTED NOT DETECTED Final   Parainfluenza Virus 3 NOT DETECTED NOT DETECTED Final   Parainfluenza Virus 4 NOT DETECTED NOT DETECTED Final   Respiratory Syncytial Virus NOT DETECTED NOT DETECTED Final   Bordetella pertussis NOT DETECTED NOT DETECTED Final   Chlamydophila pneumoniae NOT DETECTED NOT DETECTED Final   Mycoplasma pneumoniae NOT DETECTED NOT DETECTED Final    Comment: Performed at [facility], [address] [place], [place] [address]  MRSA PCR Screening     Status: Abnormal   Collection Time: 08/07/18  7:05 PM  Result Value  Ref Range Status   MRSA by PCR POSITIVE (A) NEGATIVE Final    Comment:        The GeneXpert MRSA Assay (FDA approved for NASAL specimens only), is one component of a comprehensive MRSA colonization surveillance program. It is not intended to diagnose MRSA infection nor to guide or monitor treatment for MRSA infections. CRITICAL RESULT CALLED TO, READ BACK BY AND VERIFIED WITH: CALLED TO FELICIA PREUDHOMME @2052  08/07/2018 [facility] Performed at [facility], [address] [place], [place] [address]     RADIOLOGY:  Dg Hip Operative Unilat W Or W/o Pelvis Left  Result Date: 08/08/2018 CLINICAL DATA:  Left hip intramedullary nail. EXAM: OPERATIVE left HIP (WITH PELVIS IF PERFORMED) 4 VIEWS TECHNIQUE: Fluoroscopic spot image(s) were submitted for interpretation post-operatively. COMPARISON:  CT 08/07/2018 FINDINGS: Exam demonstrates placement of a left femoral intramedullary nail with associated screw bridging patient's intertrochanteric fracture into the femoral head as hardware is intact. There is anatomic alignment over the fracture site. IMPRESSION: Left  intramedullary nail with associated screw bridging patient's intertrochanteric fracture with hardware intact and anatomic alignment over the fracture site. Electronically Signed   By: Marin Olp M.D.   On: 08/08/2018 19:26    EKG:   Orders placed or performed during the hospital encounter of 08/07/18  . EKG 12-Lead  . EKG 12-Lead  . ED EKG 12-Lead  . ED EKG 12-Lead      Management plans discussed with the patient, family and they are in agreement.  CODE STATUS:     Code Status Orders  (From admission, onward)         Start     Ordered   08/07/18 1820  Full code  Continuous     08/07/18 1819        Code Status History    Date Active Date Inactive Code Status Order ID Comments User Context   10/13/2017 1955 10/20/2017 1721 Full Code [id number]  Gorden Harms, MD ED   10/03/2017 0209 10/04/2017 1452 Full Code [id number]  Amelia Jo, MD Inpatient   03/19/2015 1939 03/21/2015 1928 Full Code [id number]  Bettey Costa, MD Inpatient   03/15/2015 2114 03/16/2015 1837 Full Code [id number]  Hower, Aaron Mose, MD ED      TOTAL TIME TAKING CARE OF THIS PATIENT: 35 minutes.    Vaughan Basta M.D on 08/10/2018 at 12:34 PM  Between 7am to 6pm - Pager - [phone]  After 6pm go to [email or web address] - password [email or web address] [facility]  Office  [phone]  CC: Primary care physician; John Axe, MD   Note: This dictation was prepared with Dragon dictation along with smaller phrase technology. Any transcriptional errors that result from this process are unintentional.

## 2018-08-10 NOTE — Progress Notes (Signed)
Report given to [facility], report given to accepting RN Caryl Pina at [facility]. Packet prepared for patient transport. Currently awaiting arrival of [facility].

## 2018-08-11 DIAGNOSIS — Z8781 Personal history of (healed) traumatic fracture: Secondary | ICD-10-CM

## 2018-08-11 LAB — BASIC METABOLIC PANEL
Anion gap: 7 (ref 5–15)
BUN: 14 mg/dL (ref 8–23)
CO2: 26 mmol/L (ref 22–32)
Calcium: 8.1 mg/dL — ABNORMAL LOW (ref 8.9–10.3)
Chloride: 98 mmol/L (ref 98–111)
Creatinine, Ser: 0.95 mg/dL (ref 0.61–1.24)
GFR calc Af Amer: >60 mL/min (ref >60)
GFR calc non Af Amer: >60 mL/min (ref >60)
Glucose, Bld: 161 mg/dL — ABNORMAL HIGH (ref 70–99)
Potassium: 5.3 mmol/L — ABNORMAL HIGH (ref 3.5–5.1)
Sodium: 131 mmol/L — ABNORMAL LOW (ref 135–145)

## 2018-08-11 LAB — CBC WITH DIFFERENTIAL/PLATELET
Abs Immature Granulocytes: 0.02 10*3/uL (ref 0.00–0.07)
Basophils Absolute: 0 10*3/uL (ref 0.0–0.1)
Basophils Relative: 0 %
Eosinophils Absolute: 0.1 10*3/uL (ref 0.0–0.5)
Eosinophils Relative: 2 %
HCT: 25.3 % — ABNORMAL LOW (ref 39.0–52.0)
Hemoglobin: 7.9 g/dL — ABNORMAL LOW (ref 13.0–17.0)
Immature Granulocytes: 0 %
Lymphocytes Relative: 18 %
Lymphs Abs: 1 10*3/uL (ref 0.7–4.0)
MCH: 30.9 pg (ref 26.0–34.0)
MCHC: 31.2 g/dL (ref 30.0–36.0)
MCV: 98.8 fL (ref 80.0–100.0)
Monocytes Absolute: 0.4 10*3/uL (ref 0.1–1.0)
Monocytes Relative: 7 %
Neutro Abs: 4.1 10*3/uL (ref 1.7–7.7)
Neutrophils Relative %: 73 %
Platelets: 340 10*3/uL (ref 150–400)
RBC: 2.56 MIL/uL — ABNORMAL LOW (ref 4.22–5.81)
RDW: 12.6 % (ref 11.5–15.5)
WBC: 5.7 10*3/uL (ref 4.0–10.5)
nRBC: 0 % (ref 0.0–0.2)

## 2018-08-11 LAB — HEMOGLOBIN A1C
Hgb A1c MFr Bld: 7.1 % — ABNORMAL HIGH (ref 4.8–5.6)
Mean Plasma Glucose: 157.07 mg/dL

## 2018-08-11 LAB — GLUCOSE, CAPILLARY
Glucose-Capillary: 229 mg/dL — ABNORMAL HIGH (ref 70–99)
Glucose-Capillary: 148 mg/dL — ABNORMAL HIGH (ref 70–99)
Glucose-Capillary: 199 mg/dL — ABNORMAL HIGH (ref 70–99)
Glucose-Capillary: 221 mg/dL — ABNORMAL HIGH (ref 70–99)
Glucose-Capillary: 205 mg/dL — ABNORMAL HIGH (ref 70–99)

## 2018-08-11 MED ORDER — INSULIN DETEMIR 100 UNIT/ML ~~LOC~~ SOLN
14.0000 [IU] | Freq: Every day | SUBCUTANEOUS | Status: DC
  Administered 2018-08-12: 14 [IU] via SUBCUTANEOUS
  Filled 2018-08-11: qty 0.14

## 2018-08-11 MED ORDER — ACETAMINOPHEN 325 MG PO TABS
650.0000 mg | ORAL_TABLET | Freq: Four times a day (QID) | ORAL | Status: DC | PRN
  Administered 2018-08-14 – 2018-08-15 (×2): 650 mg via ORAL
  Filled 2018-08-11 (×2): qty 2

## 2018-08-11 NOTE — Evaluation (Signed)
Occupational Therapy Evaluation Patient Details Name: John Reilly MRN: [id number] DOB: Sep 08, 1955 Today's Date: 08/11/2018    History of Present Illness transferred from [place] s/p L IM nail after hip fx. PT IS COVID 19 POSITIVE, low risk.  PMH:  Parkinson's, DM   Clinical Impression   This 63 year old man was admitted for the above. At baseline, he is mod I.  Pt was limited by pain.  He needs min A for mobility and up to max A for LB adls. He does have some AE at home. Will follow in acute setting with min guard level goals.    Follow Up Recommendations  Supervision/Assistance - 24 hour(likely)    Equipment Recommendations  None recommended by OT(has 3:1)    Recommendations for Other Services       Precautions / Restrictions Precautions Precautions: Fall Restrictions LLE Weight Bearing: Weight bearing as tolerated(per ortho notes)      Mobility Bed Mobility         Supine to sit: Min assist Sit to supine: Min assist   General bed mobility comments: assist for LLE  Transfers   Equipment used: Rolling walker (2 wheeled)   Sit to Stand: Min assist         General transfer comment: light assist to stand and stabilize from high bed    Balance                                           ADL either performed or assessed with clinical judgement   ADL Overall ADL's : Needs assistance/impaired Eating/Feeding: Independent   Grooming: Set up;Sitting   Upper Body Bathing: Set up   Lower Body Bathing: Moderate assistance;Sit to/from stand   Upper Body Dressing : Set up;Sitting   Lower Body Dressing: Maximal assistance;Sit to/from stand   Toilet Transfer: Minimal assistance;Ambulation;RW(simulated, bed)   Toileting- Clothing Manipulation and Hygiene: Minimal assistance;Sit to/from stand         General ADL Comments: pt has a reacher and long sponge at home, which he did not use for adls  He states wife can assist him, if he needs help      Vision         Perception     Praxis      Pertinent Vitals/Pain Pain Assessment: 0-10 Pain Score: 8  Pain Location: LLE Pain Descriptors / Indicators: Aching;Grimacing;Guarding Pain Intervention(s): Limited activity within patient's tolerance;Monitored during session;Premedicated before session;Repositioned;Ice applied     Hand Dominance     Extremity/Trunk Assessment Upper Extremity Assessment Upper Extremity Assessment: Overall WFL for tasks assessed(grossly 4/5)           Communication Communication Communication: No difficulties   Cognition Arousal/Alertness: Awake/alert Behavior During Therapy: WFL for tasks assessed/performed Overall Cognitive Status: No family/caregiver present to determine baseline cognitive functioning                                 General Comments: mostly wfls, but had difficulty following sequence cues with RW despite multimodal cues   General Comments  Per dept protocol, wore gown, gloves, goggles and surgical mask for droplet precautions.  Pt had one cough near end of session when he was back in bed    Exercises     Shoulder Instructions      Home Living Family/patient  expects to be discharged to:: Unsure Living Arrangements: Spouse/significant other Available Help at Discharge: Family                         Home Equipment: Kasandra Knudsen - quad;Bedside commode   Additional Comments: has a walk in shower with hand held shower      Prior Functioning/Environment Level of Independence: Independent with assistive device(s)        Comments: Mod indep for ADLs, household and limited community distances, intermittent use of quad cane; does endorse history of falls (at least 2 in previous 1-2 weeks)        OT Problem List: Decreased strength;Decreased activity tolerance;Impaired balance (sitting and/or standing);Decreased knowledge of use of DME or AE;Pain;Decreased cognition      OT  Treatment/Interventions: Self-care/ADL training;DME and/or AE instruction;Balance training;Patient/family education;Therapeutic activities;Cognitive remediation/compensation    OT Goals(Current goals can be found in the care plan section) Acute Rehab OT Goals Patient Stated Goal: to get moving OT Goal Formulation: With patient Time For Goal Achievement: 08/25/18 Potential to Achieve Goals: Good ADL Goals Pt Will Perform Grooming: with supervision;standing Pt Will Perform Lower Body Bathing: with adaptive equipment;sit to/from stand;with min guard assist Pt Will Perform Lower Body Dressing: sit to/from stand;with adaptive equipment;with min guard assist Pt Will Transfer to Toilet: with min guard assist;ambulating;bedside commode Pt Will Perform Toileting - Clothing Manipulation and hygiene: with min guard assist;sit to/from stand Pt Will Perform Tub/Shower Transfer: Shower transfer;ambulating;3 in 1  OT Frequency: Min 2X/week   Barriers to D/C:            Co-evaluation PT/OT/SLP Co-Evaluation/Treatment: Yes Reason for Co-Treatment: For patient/therapist safety PT goals addressed during session: Mobility/safety with mobility OT goals addressed during session: ADL's and self-care      AM-PAC OT "6 Clicks" Daily Activity     Outcome Measure Help from another person eating meals?: None Help from another person taking care of personal grooming?: A Little Help from another person toileting, which includes using toliet, bedpan, or urinal?: A Little Help from another person bathing (including washing, rinsing, drying)?: A Lot Help from another person to put on and taking off regular upper body clothing?: A Little Help from another person to put on and taking off regular lower body clothing?: A Lot 6 Click Score: 17   End of Session    Activity Tolerance: Patient limited by pain Patient left: in bed;with call bell/phone within reach;with bed alarm set  OT Visit Diagnosis: Pain Pain  - Right/Left: Left Pain - part of body: Hip                Time: 7672-0947 OT Time Calculation (min): 21 min Charges:  OT General Charges $OT Visit: 1 Visit OT Evaluation $OT Eval Moderate Complexity: 1 Mod  Lesle Chris, OTR/L Acute Rehabilitation Services [phone] [facility] pager [phone] office 08/11/2018  [place] 08/11/2018, 2:03 PM

## 2018-08-11 NOTE — Progress Notes (Signed)
PROGRESS NOTE    DAVARIOUS Reilly  NLZ:[id number] DOB: Mar 28, 1956 DOA: 08/10/2018 PCP: Glendon Axe, MD  Brief Narrative: John Reilly is a 63 y.o. male with medical history significant for Parkinson disease, type 1 diabetes who presented to [facility] as a transfer from [facility] due to a positive COVID-19 test.  Patient was recently admitted at [facility] on 08/07/2018 after a mechanical fall resulting in left hip fracture.  He had a left hip repair done on 08/08/2018.  Hospital course complicated by DKA for which he was treated.  h/o intermittent dry cough.  Positive COVID-19.  Assessment & Plan:   Left hip fracture post mechanical fall status post left hip repair, POA Presented at [facility] post mechanical fall with left hip fracture Left hip repair on 08/08/2018 -Lovenox for Dvt prophylaxis -pain control -physical therapy -Needs SNF for rehabilitation which will be challenging given positive Covid  Positive Covid-19 infection -without hypoxia, only mild cough at this time -Supportive care -currently on droplet/contact isolation per infection prevention/[place] guidelines  Type 2DM -Recently treated for DKA at [facility] -continue Levemir, sliding scale insulin CBGs in 140-200  History of Parkinson's disease  -Continue home regimen of Sinemet  Recently treated UTI Resume antibiotics  Essential hypertension Signs are stable Resume home medications  Anemia  -Recent surgery/perioperative blood loss and hemodilution -No overt bleeding, monitor  DVT prophylaxis: Subcu Lovenox daily  Code Status: Full code  Family Communication: None at bedside  Disposition Plan: Admit to [place] called: None   Procedures:   Antimicrobials:    Subjective: -K, mild discomfort at the left hip with activity ambulating little bit in the room with assistance, denies worsening dyspnea  Objective: Vitals:   08/10/18 1551 08/10/18  1827 08/11/18 0519  BP: (!) 133/95 (!) 133/95 (!) 150/80  Pulse: (!) 102 (!) 102 93  Resp: 18 18 14   Temp: 99 F (37.2 C) 99 F (37.2 C) 98.4 F (36.9 C)  TempSrc: Axillary Oral Oral  SpO2: 100%  99%  Weight:  77.6 kg   Height:  5\' 10"  (1.778 m)     Intake/Output Summary (Last 24 hours) at 08/11/2018 1230 Last data filed at 08/11/2018 0533 Gross per 24 hour  Intake 770 ml  Output 700 ml  Net 70 ml   Filed Weights   08/10/18 1827  Weight: 77.6 kg    Examination:  General exam: Appears calm and comfortable, AAO 3, no distress Respiratory system: Clear to auscultation. Respiratory effort normal. Cardiovascular system: S1 & S2 heard, RRR  Gastrointestinal system: Abdomen is nondistended, soft and nontender.Normal bowel sounds heard. Central nervous system: Alert and oriented. No focal neurological deficits. Extremities: left hip with dressing Condom catheter noted Skin: No rashes, lesions or ulcers Psychiatry: flat affect     Data Reviewed:   CBC: Recent Labs  Lab 08/07/18 1506 08/08/18 0555 08/09/18 0519 08/11/18 0345  WBC 15.3* 14.8* 10.9* 5.7  NEUTROABS 13.9*  --  8.6* 4.1  HGB 10.9* 8.7* 8.2* 7.9*  HCT 32.9* 25.6* 24.9* 25.3*  MCV 95.4 92.4 95.4 98.8  PLT 423* 339 283 902   Basic Metabolic Panel: Recent Labs  Lab 08/07/18 2202 08/08/18 0206 08/09/18 0519 08/10/18 0548 08/11/18 0345  NA 126* 126* 132* 132* 131*  K 3.9 4.0 3.9 4.2 5.3*  CL 94* 95* 99 100 98  CO2 20* 22 24 26 26   GLUCOSE 179* 123* 87 131* 161*  BUN 26* 25* 17 12 14  CREATININE 1.52* 1.39* 0.94 0.91 0.95  CALCIUM 7.7* 7.7* 7.9* 8.0* 8.1*   GFR: Estimated Creatinine Clearance: 82.2 mL/min (by C-G formula based on SCr of 0.95 mg/dL). Liver Function Tests: Recent Labs  Lab 08/07/18 1506 08/09/18 0519  AST 56* 44*  ALT 16 6  ALKPHOS 112 82  BILITOT 1.7* 0.5  PROT 7.1 5.7*  ALBUMIN 3.6 2.6*   No results for input(s): LIPASE, AMYLASE in the last 168 hours. No results for  input(s): AMMONIA in the last 168 hours. Coagulation Profile: No results for input(s): INR, PROTIME in the last 168 hours. Cardiac Enzymes: Recent Labs  Lab 08/07/18 1506 08/08/18 0555  CKTOTAL 1,647* 904*   BNP (last 3 results) No results for input(s): PROBNP in the last 8760 hours. HbA1C: Recent Labs    08/11/18 0345  HGBA1C 7.1*   CBG: Recent Labs  Lab 08/10/18 1626 08/10/18 2126 08/10/18 2322 08/11/18 0809 08/11/18 1221  GLUCAP 269* 214* 229* 148* 199*   Lipid Profile: No results for input(s): CHOL, HDL, LDLCALC, TRIG, CHOLHDL, LDLDIRECT in the last 72 hours. Thyroid Function Tests: No results for input(s): TSH, T4TOTAL, FREET4, T3FREE, THYROIDAB in the last 72 hours. Anemia Panel: No results for input(s): VITAMINB12, FOLATE, FERRITIN, TIBC, IRON, RETICCTPCT in the last 72 hours. Urine analysis:    Component Value Date/Time   COLORURINE YELLOW (A) 08/07/2018 1507   APPEARANCEUR HAZY (A) 08/07/2018 1507   LABSPEC 1.017 08/07/2018 1507   PHURINE 5.0 08/07/2018 1507   GLUCOSEU >=500 (A) 08/07/2018 1507   HGBUR LARGE (A) 08/07/2018 1507   BILIRUBINUR NEGATIVE 08/07/2018 1507   KETONESUR 80 (A) 08/07/2018 1507   PROTEINUR 100 (A) 08/07/2018 1507   NITRITE NEGATIVE 08/07/2018 1507   LEUKOCYTESUR NEGATIVE 08/07/2018 1507   Sepsis Labs: @LABRCNTIP (procalcitonin:4,lacticidven:4)  ) Recent Results (from the past 240 hour(s))  Blood Culture (routine x 2)     Status: None (Preliminary result)   Collection Time: 08/07/18  3:07 PM  Result Value Ref Range Status   Specimen Description BLOOD LEFT ARM  Final   Special Requests   Final    BOTTLES DRAWN AEROBIC AND ANAEROBIC Blood Culture results may not be optimal due to an excessive volume of blood received in culture bottles   Culture   Final    NO GROWTH 4 DAYS Performed at [facility], [address] [place], [place] [address]    Report Status PENDING  Incomplete  Blood Culture (routine x 2)      Status: Abnormal   Collection Time: 08/07/18  3:07 PM  Result Value Ref Range Status   Specimen Description   Final    BLOOD RIGHT ARM Performed at [facility], [address] [place], [place] [address]    Special Requests   Final    BOTTLES DRAWN AEROBIC AND ANAEROBIC Blood Culture results may not be optimal due to an excessive volume of blood received in culture bottles Performed at [facility], [address] [place], [place] [address]    Culture  Setup Time   Final    [place], READ BACK BY AND VERIFIED WITH: KAREN HAYES AT 1108 08/08/18 SDR    Culture (A)  Final    STAPHYLOCOCCUS SPECIES (COAGULASE NEGATIVE) THE SIGNIFICANCE OF ISOLATING THIS ORGANISM FROM A SINGLE SET OF BLOOD CULTURES WHEN MULTIPLE SETS ARE DRAWN IS UNCERTAIN. PLEASE NOTIFY THE MICROBIOLOGY DEPARTMENT WITHIN ONE WEEK IF SPECIATION AND SENSITIVITIES ARE REQUIRED. Performed at  [facility], [place] [address] [place], [place] [address]    Report Status 08/10/2018 FINAL  Final  Urine culture     Status: Abnormal   Collection Time: 08/07/18  3:07 PM  Result Value Ref Range Status   Specimen Description   Final    URINE, RANDOM Performed at [facility], [address] [place], [place] [address]    Special Requests   Final    NONE Performed at [facility], [place], [place] [address]    Culture 80,000 COLONIES/mL KLEBSIELLA PNEUMONIAE (A)  Final   Report Status 08/09/2018 FINAL  Final   Organism ID, Bacteria KLEBSIELLA PNEUMONIAE (A)  Final      Susceptibility   Klebsiella pneumoniae - MIC*    AMPICILLIN >=32 RESISTANT Resistant     CEFAZOLIN <=4 SENSITIVE Sensitive     CEFTRIAXONE <=1 SENSITIVE Sensitive     CIPROFLOXACIN <=0.25 SENSITIVE Sensitive     GENTAMICIN <=1 SENSITIVE Sensitive     IMIPENEM <=0.25 SENSITIVE Sensitive     NITROFURANTOIN 32 SENSITIVE Sensitive      TRIMETH/SULFA <=20 SENSITIVE Sensitive     AMPICILLIN/SULBACTAM 8 SENSITIVE Sensitive     PIP/TAZO <=4 SENSITIVE Sensitive     Extended ESBL NEGATIVE Sensitive     * 80,000 COLONIES/mL KLEBSIELLA PNEUMONIAE  Blood Culture ID Panel (Reflexed)     Status: Abnormal   Collection Time: 08/07/18  3:07 PM  Result Value Ref Range Status   Enterococcus species NOT DETECTED NOT DETECTED Final   Listeria monocytogenes NOT DETECTED NOT DETECTED Final   Staphylococcus species DETECTED (A) NOT DETECTED Final    Comment: Methicillin (oxacillin) resistant coagulase negative staphylococcus. Possible blood culture contaminant (unless isolated from more than one blood culture draw or clinical case suggests pathogenicity). No antibiotic treatment is indicated for blood  culture contaminants. CRITICAL RESULT CALLED TO, READ BACK BY AND VERIFIED WITH:  KAREN HAYES AT 1108 08/08/18 SDR    Staphylococcus aureus (BCID) NOT DETECTED NOT DETECTED Final   Methicillin resistance DETECTED (A) NOT DETECTED Final    Comment: CRITICAL RESULT CALLED TO, READ BACK BY AND VERIFIED WITH:  KAREN HAYES AT 1108 08/08/18 SDR    Streptococcus species NOT DETECTED NOT DETECTED Final   Streptococcus agalactiae NOT DETECTED NOT DETECTED Final   Streptococcus pneumoniae NOT DETECTED NOT DETECTED Final   Streptococcus pyogenes NOT DETECTED NOT DETECTED Final   Acinetobacter baumannii NOT DETECTED NOT DETECTED Final   Enterobacteriaceae species NOT DETECTED NOT DETECTED Final   Enterobacter cloacae complex NOT DETECTED NOT DETECTED Final   Escherichia coli NOT DETECTED NOT DETECTED Final   Klebsiella oxytoca NOT DETECTED NOT DETECTED Final   Klebsiella pneumoniae NOT DETECTED NOT DETECTED Final   Proteus species NOT DETECTED NOT DETECTED Final   Serratia marcescens NOT DETECTED NOT DETECTED Final   Haemophilus influenzae NOT DETECTED NOT DETECTED Final   Neisseria meningitidis NOT DETECTED NOT DETECTED Final   Pseudomonas  aeruginosa NOT DETECTED NOT DETECTED Final   Candida albicans NOT DETECTED NOT DETECTED Final   Candida glabrata NOT DETECTED NOT DETECTED Final   Candida krusei NOT DETECTED NOT DETECTED Final   Candida parapsilosis NOT DETECTED NOT DETECTED Final   Candida tropicalis NOT DETECTED NOT DETECTED Final    Comment: Performed at [facility], [address] [place], [place] [address]  Novel Coronavirus, NAA (hospital order; send-out to ref lab)     Status: Abnormal   Collection Time: 08/07/18  7:05  PM  Result Value Ref Range Status   SARS-CoV-2, NAA DETECTED (A) NOT DETECTED Final    Comment: Positive (Detected) results are indicative of active infection with SARS CoV 2. A positive result does not rule out bacterial infection or coinfection with other viruses. Detection of SARS CoV 2 viral RNA may not indicate that SARS CoV 2 is the causative agent for clinical symptoms. Positive and negative predictive values of testing are highly dependent on prevalence. False positive test results are more likely when prevalence is moderate to low. CRITICAL RESULT CALLED TO, READ BACK BY AND VERIFIED WITH: Mariann Laster RN 0998 08/09/18 A BROWN[place] (NOTE) The expected result is Negative (Not Detected). The SARS CoV 2 test is intended for the presumptive qualitative  detection of nucleic acid from SARS CoV 2 in upper and lower  respiratory specimens. Testing methodology is real time RT PCR. Test results must be correlated with clinical presentation and  evaluated in the context of other laboratory and epidemiologic data.  Test performance can be affected because the epidemiology and   clinical spectrum of infection caused by SARS CoV 2 is not fully  known. For example, the optimum types of specimens to collect and  when during the course of infection these specimens are most likely  to contain detectable viral RNA may not be known. This test has not been Food and Drug Administration (FDA) cleared or   approved and has been authorized by FDA under an Emergency Use  Authorization (EUA). The test is only authorized for the duration of  the declaration that circumstances exist justifying the authorization  of emergency use of in vitro diagnostic tests for detection and or  diagnosis of SARS CoV 2 under Section 564(b)(1) of the Act, 21 U.S.C.  section [phone] 3(b)(1), unless the authorization is terminated or  revoked sooner. [facility] is certified under the  Clinical Laboratory Improvement Amendments of 1988 (CLIA), 42 U.S.C.  section [phone], to perform high complexity tests. Performed at [place] [id number], [place], [place] [address] Laboratory Director: Loleta Books, MD Performed at [facility], [place] [address], [place], [place] [address]    Coronavirus Source NASOPHARYNGEAL  Final    Comment: Performed at [facility], [address] [place], [place] [address]  Respiratory Panel by PCR     Status: None   Collection Time: 08/07/18  7:05 PM  Result Value Ref Range Status   Adenovirus NOT DETECTED NOT DETECTED Final   Coronavirus 229E NOT DETECTED NOT DETECTED Final    Comment: (NOTE) The Coronavirus on the Respiratory Panel, DOES NOT test for the novel  Coronavirus (2019 nCoV)    Coronavirus HKU1 NOT DETECTED NOT DETECTED Final   Coronavirus NL63 NOT DETECTED NOT DETECTED Final   Coronavirus OC43 NOT DETECTED NOT DETECTED Final   Metapneumovirus NOT DETECTED NOT DETECTED Final   Rhinovirus / Enterovirus NOT DETECTED NOT DETECTED Final   Influenza A NOT DETECTED NOT DETECTED Final   Influenza B NOT DETECTED NOT DETECTED Final   Parainfluenza Virus 1 NOT DETECTED NOT DETECTED Final   Parainfluenza Virus 2 NOT DETECTED NOT DETECTED Final   Parainfluenza Virus 3 NOT DETECTED NOT DETECTED Final   Parainfluenza Virus 4 NOT DETECTED NOT DETECTED Final   Respiratory Syncytial Virus NOT  DETECTED NOT DETECTED Final   Bordetella pertussis NOT DETECTED NOT DETECTED Final   Chlamydophila pneumoniae NOT DETECTED NOT DETECTED Final  Mycoplasma pneumoniae NOT DETECTED NOT DETECTED Final    Comment: Performed at [facility], [place] [address], [place], [place] [address]  MRSA PCR Screening     Status: Abnormal   Collection Time: 08/07/18  7:05 PM  Result Value Ref Range Status   MRSA by PCR POSITIVE (A) NEGATIVE Final    Comment:        The GeneXpert MRSA Assay (FDA approved for NASAL specimens only), is one component of a comprehensive MRSA colonization surveillance program. It is not intended to diagnose MRSA infection nor to guide or monitor treatment for MRSA infections. CRITICAL RESULT CALLED TO, READ BACK BY AND VERIFIED WITH: CALLED TO [place] @2052  08/07/2018 [facility] Performed at [facility], [address], [place], [place] [address]          Radiology Studies: No results found.      Scheduled Meds: . carbidopa-levodopa  1 tablet Oral QHS  . carbidopa-levodopa  1 tablet Oral TID  . docusate sodium  100 mg Oral BID  . enoxaparin  40 mg Subcutaneous Q24H  . folic acid  1 mg Oral Daily  . insulin aspart  0-9 Units Subcutaneous TID WC  . insulin detemir  10 Units Subcutaneous QHS  . metoprolol succinate  50 mg Oral Daily  . multivitamin with minerals  1 tablet Oral Daily  . pantoprazole  40 mg Oral Daily  . senna-docusate  2 tablet Oral BID  . thiamine  100 mg Oral Daily   Continuous Infusions: . ceFAZolin 1 g (08/11/18 0827)     LOS: 1 day    Time spent: 60min  Domenic Polite, MD [facility]  08/11/2018, 12:30 PM

## 2018-08-11 NOTE — Progress Notes (Signed)
Physical Therapy Treatment Patient Details Name: John Reilly MRN: [id number] DOB: December 25, 1955 Today's Date: 08/11/2018    History of Present Illness 63 yo male transferred from [place] s/p L IM nail after hip fx. Pt IS COVID 19 POSITIVE- low risk.  PMH:  Parkinson's, DM    PT Comments    Progressing slowly with mobility. Pain rated 9/10 with activity. Distance limited by pain. Assisted pt back to bed at end of session. Will continue to follow. Continuing to assess d/c needs.   Follow Up Recommendations  SNF(depending on progress)     Equipment Recommendations  Rolling walker with 5" wheels    Recommendations for Other Services       Precautions / Restrictions Precautions Precautions: Fall Restrictions Weight Bearing Restrictions: No LLE Weight Bearing: Weight bearing as tolerated    Mobility  Bed Mobility Overal bed mobility: Needs Assistance Bed Mobility: Supine to Sit;Sit to Supine     Supine to sit: Min assist;HOB elevated Sit to supine: Min assist;HOB elevated   General bed mobility comments: Assist for LLE. Increased time. Cues for safety, technique  Transfers Overall transfer level: Needs assistance Equipment used: Rolling walker (2 wheeled) Transfers: Sit to/from Stand Sit to Stand: Min assist;From elevated surface         General transfer comment: VCs safety, technique, hand placement. Assist to rise, stabilize, control descent.   Ambulation/Gait Ambulation/Gait assistance: Min assist Gait Distance (Feet): 15 Feet Assistive device: Rolling walker (2 wheeled) Gait Pattern/deviations: Step-to pattern;Antalgic     General Gait Details: Mod VCs safety, technique, sequence. Assist to stabilize pt throughout short distance. Distance limited by pain.    Stairs             Wheelchair Mobility    Modified Rankin (Stroke Patients Only)       Balance Overall balance assessment: Needs assistance;History of Falls         Standing balance  support: Bilateral upper extremity supported Standing balance-[name]: Poor                              Cognition Arousal/Alertness: Awake/alert Behavior During Therapy: WFL for tasks assessed/performed Overall Cognitive Status: Within Functional Limits for tasks assessed                                 General Comments: mostly wfls, but had difficulty following sequence cues with RW despite multimodal cues      Exercises      General Comments General comments (skin integrity, edema, etc.): Per dept protocol, wore gown, gloves, goggles and surgical mask for droplet precautions.  Pt had one cough near end of session when he was back in bed      Pertinent Vitals/Pain Pain Assessment: 0-10 Pain Score: 9  Pain Location: L LE Pain Descriptors / Indicators: Aching;Grimacing;Guarding Pain Intervention(s): Repositioned;Monitored during session;Limited activity within patient's tolerance    Home Living Family/patient expects to be discharged to:: Unsure Living Arrangements: Spouse/significant other Available Help at Discharge: Family         Home Equipment: Kasandra Knudsen - quad;Bedside commode Additional Comments: has a walk in shower with hand held shower    Prior Function Level of Independence: Independent with assistive device(s)      Comments: Mod indep for ADLs, household and limited community distances, intermittent use of quad cane; does endorse history of falls (at least 2  in previous 1-2 weeks)   PT Goals (current goals can now be found in the care plan section) Acute Rehab PT Goals Patient Stated Goal: to get moving Progress towards PT goals: Progressing toward goals    Frequency    Min 5X/week      PT Plan Current plan remains appropriate    Co-evaluation   Reason for Co-Treatment: For patient/therapist safety PT goals addressed during session: Mobility/safety with mobility OT goals addressed during session: ADL's and self-care       AM-PAC PT "6 Clicks" Mobility   Outcome Measure  Help needed turning from your back to your side while in a flat bed without using bedrails?: A Little Help needed moving from lying on your back to sitting on the side of a flat bed without using bedrails?: A Little Help needed moving to and from a bed to a chair (including a wheelchair)?: A Little Help needed standing up from a chair using your arms (e.g., wheelchair or bedside chair)?: A Little Help needed to walk in hospital room?: A Little Help needed climbing 3-5 steps with a railing? : A Lot 6 Click Score: 17    End of Session Equipment Utilized During Treatment: Gait belt (pt's personal woven belt), gown, gloves, droplet mask, safety glasses Activity Tolerance: Patient limited by pain Patient left: in bed;with call bell/phone within reach;with bed alarm set   PT Visit Diagnosis: Muscle weakness (generalized) (M62.81);Difficulty in walking, not elsewhere classified (R26.2);Pain Pain - Right/Left: Left Pain - part of body: Hip     Time: 6213-0865 PT Time Calculation (min) (ACUTE ONLY): 21 min  Charges:  $Gait Training: 8-22 mins                       Weston Anna, PT Acute Rehabilitation Services Pager: [phone] Office: [phone]

## 2018-08-12 LAB — BASIC METABOLIC PANEL
Anion gap: 7 (ref 5–15)
BUN: 17 mg/dL (ref 8–23)
CO2: 26 mmol/L (ref 22–32)
Calcium: 8 mg/dL — ABNORMAL LOW (ref 8.9–10.3)
Chloride: 98 mmol/L (ref 98–111)
Creatinine, Ser: 1.01 mg/dL (ref 0.61–1.24)
GFR calc Af Amer: >60 mL/min (ref >60)
GFR calc non Af Amer: >60 mL/min (ref >60)
Glucose, Bld: 353 mg/dL — ABNORMAL HIGH (ref 70–99)
Potassium: 5.2 mmol/L — ABNORMAL HIGH (ref 3.5–5.1)
Sodium: 131 mmol/L — ABNORMAL LOW (ref 135–145)

## 2018-08-12 LAB — GLUCOSE, CAPILLARY
Glucose-Capillary: 224 mg/dL — ABNORMAL HIGH (ref 70–99)
Glucose-Capillary: 273 mg/dL — ABNORMAL HIGH (ref 70–99)
Glucose-Capillary: 276 mg/dL — ABNORMAL HIGH (ref 70–99)
Glucose-Capillary: 235 mg/dL — ABNORMAL HIGH (ref 70–99)

## 2018-08-12 LAB — CULTURE, BLOOD (ROUTINE X 2): CULTURE: NO GROWTH

## 2018-08-12 LAB — CBC
HCT: 25.3 % — ABNORMAL LOW (ref 39.0–52.0)
Hemoglobin: 7.9 g/dL — ABNORMAL LOW (ref 13.0–17.0)
MCH: 30.9 pg (ref 26.0–34.0)
MCHC: 31.2 g/dL (ref 30.0–36.0)
MCV: 98.8 fL (ref 80.0–100.0)
Platelets: 335 10*3/uL (ref 150–400)
RBC: 2.56 MIL/uL — ABNORMAL LOW (ref 4.22–5.81)
RDW: 12.5 % (ref 11.5–15.5)
WBC: 5.9 10*3/uL (ref 4.0–10.5)
nRBC: 0 % (ref 0.0–0.2)

## 2018-08-12 MED ORDER — INSULIN DETEMIR 100 UNIT/ML ~~LOC~~ SOLN
24.0000 [IU] | Freq: Every day | SUBCUTANEOUS | Status: DC
  Administered 2018-08-12: 24 [IU] via SUBCUTANEOUS
  Filled 2018-08-12: qty 0.24

## 2018-08-12 MED ORDER — AMLODIPINE BESYLATE 5 MG PO TABS
5.0000 mg | ORAL_TABLET | Freq: Every day | ORAL | Status: DC
  Administered 2018-08-12 – 2018-08-16 (×5): 5 mg via ORAL
  Filled 2018-08-12 (×5): qty 1

## 2018-08-12 NOTE — Progress Notes (Addendum)
PROGRESS NOTE    John Reilly  KGU:[id number] DOB: [birth year] DOA: 08/10/2018 PCP: Glendon Axe, MD  Brief Narrative: John Reilly is a 63 y.o. male with medical history significant for Parkinson disease, type 1 diabetes who presented to [facility] as a transfer from [facility] due to a positive COVID-19 test.  Patient was recently admitted at [facility] on 08/07/2018 after a mechanical fall resulting in left hip fracture.  He had a left hip repair done on 08/08/2018.  Hospital course complicated by DKA for which he was treated.  h/o intermittent dry cough.  Positive COVID-19.  Assessment & Plan:   Left hip fracture post mechanical fall status post left hip repair, POA Presented at [facility] post mechanical fall with left hip fracture Left hip repair on 08/08/2018 -Lovenox for Dvt prophylaxis -pain control -physical therapy -Needs SNF for rehabilitation which will be challenging given positive Covid -remains stable at this time  Positive Covid-19 infection -without hypoxia, only mild cough at this time -Supportive care -currently on droplet/contact isolation per infection prevention/[place] guidelines  Type 2DM -Recently treated for DKA at [facility] -continue Levemir increase dose, sliding scale insulin CBGs in 140-200  History of Parkinson's disease  -Continue home regimen of Sinemet  Recently treated UTI -completed 5days of Abx, DC all Abx  Essential hypertension -BP stable continue amlodipine  Anemia  -Recent surgery/perioperative blood loss and hemodilution -No overt bleeding, monitor  DVT prophylaxis: Subcu Lovenox daily  Code Status: Full code  Family Communication: None at bedside, called and updated wife  Disposition Plan: Needs SNF which is challenging with him being Covid Positive  Consults called: None   Procedures:   Antimicrobials:    Subjective: -feels ok, mild L hip discomfort  Objective: Vitals:   08/11/18 0519 08/11/18 1432 08/11/18 2009 08/12/18 0525  BP: (!) 150/80 (!) 152/78 (!) 153/71 (!) 154/72  Pulse: 93 93 95 90  Resp: 14  14 14   Temp: 98.4 F (36.9 C) 98.5 F (36.9 C) 98.5 F (36.9 C) 98.5 F (36.9 C)  TempSrc: Oral Oral Oral Oral  SpO2: 99%  100% 100%  Weight:      Height:        Intake/Output Summary (Last 24 hours) at 08/12/2018 1013 Last data filed at 08/12/2018 0936 Gross per 24 hour  Intake 920 ml  Output 1075 ml  Net -155 ml   Filed Weights   08/10/18 1827  Weight: 77.6 kg    Examination:  General exam: Appears calm and comfortable, AAO 3, no distress Rest of exam deferred due to no obvious symptoms/distress and Covid Positivity Psychiatry: flat affect     Data Reviewed:   CBC: Recent Labs  Lab 08/07/18 1506 08/08/18 0555 08/09/18 0519 08/11/18 0345 08/12/18 0333  WBC 15.3* 14.8* 10.9* 5.7 5.9  NEUTROABS 13.9*  --  8.6* 4.1  --   HGB 10.9* 8.7* 8.2* 7.9* 7.9*  HCT 32.9* 25.6* 24.9* 25.3* 25.3*  MCV 95.4 92.4 95.4 98.8 98.8  PLT 423* 339 283 340 628   Basic Metabolic Panel: Recent Labs  Lab 08/08/18 0206 08/09/18 0519 08/10/18 0548 08/11/18 0345 08/12/18 0333  NA 126* 132* 132* 131* 131*  K 4.0 3.9 4.2 5.3* 5.2*  CL 95* 99 100 98 98  CO2 22 24 26 26 26   GLUCOSE 123* 87 131* 161* 353*  BUN 25* 17 12 14 17   CREATININE 1.39* 0.94 0.91 0.95 1.01  CALCIUM 7.7* 7.9* 8.0* 8.1* 8.0*  GFR: Estimated Creatinine Clearance: 77.3 mL/min (by C-G formula based on SCr of 1.01 mg/dL). Liver Function Tests: Recent Labs  Lab 08/07/18 1506 08/09/18 0519  AST 56* 44*  ALT 16 6  ALKPHOS 112 82  BILITOT 1.7* 0.5  PROT 7.1 5.7*  ALBUMIN 3.6 2.6*   No results for input(s): LIPASE, AMYLASE in the last 168 hours. No results for input(s): AMMONIA in the last 168 hours. Coagulation Profile: No results for input(s): INR, PROTIME in the last 168 hours. Cardiac Enzymes: Recent Labs  Lab 08/07/18 1506 08/08/18 0555  CKTOTAL 1,647* 904*    BNP (last 3 results) No results for input(s): PROBNP in the last 8760 hours. HbA1C: Recent Labs    08/11/18 0345  HGBA1C 7.1*   CBG: Recent Labs  Lab 08/11/18 0809 08/11/18 1221 08/11/18 1620 08/11/18 2009 08/12/18 0707  GLUCAP 148* 199* 221* 205* 224*   Lipid Profile: No results for input(s): CHOL, HDL, LDLCALC, TRIG, CHOLHDL, LDLDIRECT in the last 72 hours. Thyroid Function Tests: No results for input(s): TSH, T4TOTAL, FREET4, T3FREE, THYROIDAB in the last 72 hours. Anemia Panel: No results for input(s): VITAMINB12, FOLATE, FERRITIN, TIBC, IRON, RETICCTPCT in the last 72 hours. Urine analysis:    Component Value Date/Time   COLORURINE YELLOW (A) 08/07/2018 1507   APPEARANCEUR HAZY (A) 08/07/2018 1507   LABSPEC 1.017 08/07/2018 1507   PHURINE 5.0 08/07/2018 1507   GLUCOSEU >=500 (A) 08/07/2018 1507   HGBUR LARGE (A) 08/07/2018 1507   BILIRUBINUR NEGATIVE 08/07/2018 1507   KETONESUR 80 (A) 08/07/2018 1507   PROTEINUR 100 (A) 08/07/2018 1507   NITRITE NEGATIVE 08/07/2018 1507   LEUKOCYTESUR NEGATIVE 08/07/2018 1507   Sepsis Labs: @LABRCNTIP (procalcitonin:4,lacticidven:4)  ) Recent Results (from the past 240 hour(s))  Blood Culture (routine x 2)     Status: None   Collection Time: 08/07/18  3:07 PM  Result Value Ref Range Status   Specimen Description BLOOD LEFT ARM  Final   Special Requests   Final    BOTTLES DRAWN AEROBIC AND ANAEROBIC Blood Culture results may not be optimal due to an excessive volume of blood received in culture bottles   Culture   Final    NO GROWTH 5 DAYS Performed at [facility], [address] [place], [place] [address]    Report Status 08/12/2018 FINAL  Final  Blood Culture (routine x 2)     Status: Abnormal   Collection Time: 08/07/18  3:07 PM  Result Value Ref Range Status   Specimen Description   Final    BLOOD RIGHT ARM Performed at [facility], [address], [place], [place] [address]    Special  Requests   Final    BOTTLES DRAWN AEROBIC AND ANAEROBIC Blood Culture results may not be optimal due to an excessive volume of blood received in culture bottles Performed at [facility], [address] [place], [place] [address]    Culture  Setup Time   Final    [place], READ BACK BY AND VERIFIED WITH: KAREN HAYES AT 1108 08/08/18 SDR    Culture (A)  Final    STAPHYLOCOCCUS SPECIES (COAGULASE NEGATIVE) THE SIGNIFICANCE OF ISOLATING THIS ORGANISM FROM A SINGLE SET OF BLOOD CULTURES WHEN MULTIPLE SETS ARE DRAWN IS UNCERTAIN. PLEASE NOTIFY THE MICROBIOLOGY DEPARTMENT WITHIN ONE WEEK IF SPECIATION AND SENSITIVITIES ARE REQUIRED. Performed at [facility], [place] [address], [place], [place] [address]    Report Status  08/10/2018 FINAL  Final  Urine culture     Status: Abnormal   Collection Time: 08/07/18  3:07 PM  Result Value Ref Range Status   Specimen Description   Final    URINE, RANDOM Performed at [facility], [address] [place], [place] [address]    Special Requests   Final    NONE Performed at [facility], [place], [place] [address]    Culture 80,000 COLONIES/mL KLEBSIELLA PNEUMONIAE (A)  Final   Report Status 08/09/2018 FINAL  Final   Organism ID, Bacteria KLEBSIELLA PNEUMONIAE (A)  Final      Susceptibility   Klebsiella pneumoniae - MIC*    AMPICILLIN >=32 RESISTANT Resistant     CEFAZOLIN <=4 SENSITIVE Sensitive     CEFTRIAXONE <=1 SENSITIVE Sensitive     CIPROFLOXACIN <=0.25 SENSITIVE Sensitive     GENTAMICIN <=1 SENSITIVE Sensitive     IMIPENEM <=0.25 SENSITIVE Sensitive     NITROFURANTOIN 32 SENSITIVE Sensitive     TRIMETH/SULFA <=20 SENSITIVE Sensitive     AMPICILLIN/SULBACTAM 8 SENSITIVE Sensitive     PIP/TAZO <=4 SENSITIVE Sensitive     Extended ESBL NEGATIVE Sensitive     * 80,000 COLONIES/mL KLEBSIELLA PNEUMONIAE  Blood Culture ID Panel  (Reflexed)     Status: Abnormal   Collection Time: 08/07/18  3:07 PM  Result Value Ref Range Status   Enterococcus species NOT DETECTED NOT DETECTED Final   Listeria monocytogenes NOT DETECTED NOT DETECTED Final   Staphylococcus species DETECTED (A) NOT DETECTED Final    Comment: Methicillin (oxacillin) resistant coagulase negative staphylococcus. Possible blood culture contaminant (unless isolated from more than one blood culture draw or clinical case suggests pathogenicity). No antibiotic treatment is indicated for blood  culture contaminants. CRITICAL RESULT CALLED TO, READ BACK BY AND VERIFIED WITH:  KAREN HAYES AT 1108 08/08/18 SDR    Staphylococcus aureus (BCID) NOT DETECTED NOT DETECTED Final   Methicillin resistance DETECTED (A) NOT DETECTED Final    Comment: CRITICAL RESULT CALLED TO, READ BACK BY AND VERIFIED WITH:  KAREN HAYES AT 1108 08/08/18 SDR    Streptococcus species NOT DETECTED NOT DETECTED Final   Streptococcus agalactiae NOT DETECTED NOT DETECTED Final   Streptococcus pneumoniae NOT DETECTED NOT DETECTED Final   Streptococcus pyogenes NOT DETECTED NOT DETECTED Final   Acinetobacter baumannii NOT DETECTED NOT DETECTED Final   Enterobacteriaceae species NOT DETECTED NOT DETECTED Final   Enterobacter cloacae complex NOT DETECTED NOT DETECTED Final   Escherichia coli NOT DETECTED NOT DETECTED Final   Klebsiella oxytoca NOT DETECTED NOT DETECTED Final   Klebsiella pneumoniae NOT DETECTED NOT DETECTED Final   Proteus species NOT DETECTED NOT DETECTED Final   Serratia marcescens NOT DETECTED NOT DETECTED Final   Haemophilus influenzae NOT DETECTED NOT DETECTED Final   Neisseria meningitidis NOT DETECTED NOT DETECTED Final   Pseudomonas aeruginosa NOT DETECTED NOT DETECTED Final   Candida albicans NOT DETECTED NOT DETECTED Final   Candida glabrata NOT DETECTED NOT DETECTED Final   Candida krusei NOT DETECTED NOT DETECTED Final   Candida parapsilosis NOT DETECTED NOT  DETECTED Final   Candida tropicalis NOT DETECTED NOT DETECTED Final    Comment: Performed at [facility], [address] [place], [place] [address]  Novel Coronavirus, NAA (hospital order; send-out to ref lab)     Status: Abnormal   Collection Time: 08/07/18  7:05 PM  Result Value Ref Range Status   SARS-CoV-2, NAA DETECTED (A) NOT DETECTED Final  Comment: Positive (Detected) results are indicative of active infection with SARS CoV 2. A positive result does not rule out bacterial infection or coinfection with other viruses. Detection of SARS CoV 2 viral RNA may not indicate that SARS CoV 2 is the causative agent for clinical symptoms. Positive and negative predictive values of testing are highly dependent on prevalence. False positive test results are more likely when prevalence is moderate to low. CRITICAL RESULT CALLED TO, READ BACK BY AND VERIFIED WITH: Mariann Laster RN [id number] 08/09/18 A BROWNING (NOTE) The expected result is Negative (Not Detected). The SARS CoV 2 test is intended for the presumptive qualitative  detection of nucleic acid from SARS CoV 2 in upper and lower  respiratory specimens. Testing methodology is real time RT PCR. Test results must be correlated with clinical presentation and  evaluated in the context of other laboratory and epidemiologic data.  Test performance can be affected because the epidemiology and   clinical spectrum of infection caused by SARS CoV 2 is not fully  known. For example, the optimum types of specimens to collect and  when during the course of infection these specimens are most likely  to contain detectable viral RNA may not be known. This test has not been Food and Drug Administration (FDA) cleared or  approved and has been authorized by FDA under an Emergency Use  Authorization (EUA). The test is only authorized for the duration of  the declaration that circumstances exist justifying the authorization  of emergency use of in vitro  diagnostic tests for detection and or  diagnosis of SARS CoV 2 under Section 564(b)(1) of the Act, 21 U.S.C.  section [phone] 3(b)(1), unless the authorization is terminated or  revoked sooner. [facility] is certified under the  Clinical Laboratory Improvement Amendments of 1988 (CLIA), 42 U.S.C.  section [phone], to perform high complexity tests. Performed at [place] [id number], [place], [place] [address] Laboratory Director: Loleta Books, MD Performed at [facility], [place] [address], [place], [place] [address]    Coronavirus Source NASOPHARYNGEAL  Final    Comment: Performed at [facility], [address] [place], [place] [address]  Respiratory Panel by PCR     Status: None   Collection Time: 08/07/18  7:05 PM  Result Value Ref Range Status   Adenovirus NOT DETECTED NOT DETECTED Final   Coronavirus 229E NOT DETECTED NOT DETECTED Final    Comment: (NOTE) The Coronavirus on the Respiratory Panel, DOES NOT test for the novel  Coronavirus (2019 nCoV)    Coronavirus HKU1 NOT DETECTED NOT DETECTED Final   Coronavirus NL63 NOT DETECTED NOT DETECTED Final   Coronavirus OC43 NOT DETECTED NOT DETECTED Final   Metapneumovirus NOT DETECTED NOT DETECTED Final   Rhinovirus / Enterovirus NOT DETECTED NOT DETECTED Final   Influenza A NOT DETECTED NOT DETECTED Final   Influenza B NOT DETECTED NOT DETECTED Final   Parainfluenza Virus 1 NOT DETECTED NOT DETECTED Final   Parainfluenza Virus 2 NOT DETECTED NOT DETECTED Final   Parainfluenza Virus 3 NOT DETECTED NOT DETECTED Final   Parainfluenza Virus 4 NOT DETECTED NOT DETECTED Final   Respiratory Syncytial Virus NOT DETECTED NOT DETECTED Final   Bordetella pertussis NOT DETECTED NOT DETECTED Final   Chlamydophila pneumoniae NOT DETECTED NOT DETECTED Final   Mycoplasma pneumoniae NOT DETECTED NOT DETECTED Final    Comment: Performed at [facility], [address], [place], [place] [address]  MRSA PCR Screening     Status: Abnormal   Collection Time: 08/07/18  7:05 PM  Result Value Ref Range Status   MRSA by PCR POSITIVE (A) NEGATIVE Final    Comment:        The GeneXpert MRSA Assay (FDA approved for NASAL specimens only), is one component of a comprehensive MRSA colonization surveillance program. It is not intended to diagnose MRSA infection nor to guide or monitor treatment for MRSA infections. CRITICAL RESULT CALLED TO, READ BACK BY AND VERIFIED WITH: CALLED TO [place] @2052  08/07/2018 [facility] Performed at [facility], [address] [place], [place] [address]          Radiology Studies: No results found.      Scheduled Meds: . amLODipine  5 mg Oral Daily  . carbidopa-levodopa  1 tablet Oral QHS  . carbidopa-levodopa  1 tablet Oral TID  . enoxaparin  40 mg Subcutaneous Q24H  . folic acid  1 mg Oral Daily  . insulin aspart  0-9 Units Subcutaneous TID WC  . insulin detemir  24 Units Subcutaneous QHS  . metoprolol succinate  50 mg Oral Daily  . multivitamin with minerals  1 tablet Oral Daily  . pantoprazole  40 mg Oral Daily  . senna-docusate  2 tablet Oral BID  . thiamine  100 mg Oral Daily   Continuous Infusions:    LOS: 2 days    Time spent: 69min  Domenic Polite, MD [facility]  08/12/2018, 10:13 AM

## 2018-08-12 NOTE — Care Management Important Message (Signed)
Important Message  Patient Details  Name: John Reilly MRN: [id number] Date of Birth: [birth year]   Medicare Important Message Given:  Yes    Kerin Salen 08/12/2018, 11:34 AMImportant Message  Patient Details  Name: John Reilly MRN: [id number] Date of Birth: 02/06/1956   Medicare Important Message Given:  Yes    Kerin Salen 08/12/2018, 11:34 AM

## 2018-08-12 NOTE — Progress Notes (Signed)
Occupational Therapy Treatment Patient Details Name: LEONA PRESSLY MRN: [id number] DOB: 03/20/1956 Today's Date: 08/12/2018    History of present illness 63 yo male transferred from [place] s/p L IM nail after hip fx. Pt IS COVID 19 POSITIVE- low risk.  PMH:  Parkinson's, DM   OT comments  Pt a impulsive with standing today.  Agreeable to getting up to chair.  Pt has AE and verbalizes understanding of use. He will need to practice shower transfer prior to home  Follow Up Recommendations  Supervision/Assistance - 24 hour    Equipment Recommendations  None recommended by OT    Recommendations for Other Services      Precautions / Restrictions Precautions Precautions: Fall Restrictions Weight Bearing Restrictions: No LLE Weight Bearing: Weight bearing as tolerated       Mobility Bed Mobility         Supine to sit: Min assist;HOB elevated     General bed mobility comments: Assist for LLE. Increased time. Cues for safety, technique  Transfers   Equipment used: Rolling walker (2 wheeled)   Sit to Stand: Min assist         General transfer comment: VCs safety, technique, hand placement. Assist to rise, stabilize, control descent. Pt initiated standing prior to OT being ready    Balance                                           ADL either performed or assessed with clinical judgement   ADL                           Toilet Transfer: Minimal assistance;Stand-pivot;RW(chair)             General ADL Comments: brought handout of AE.  Pt has this from previous injury about 5 years ago (ankle fx).  He didn't feel he needed to review it.  Based on clinical judgment, LB bathing min A with AE; LB dressing mod with AE due to pain and assistance needed to stand     Vision       Perception     Praxis      Cognition Arousal/Alertness: Awake/alert Behavior During Therapy: Impulsive Overall Cognitive Status: Within Functional Limits  for tasks assessed                                          Exercises     Shoulder Instructions       General Comments      Pertinent Vitals/ Pain       Pain Assessment: Faces Faces Pain Scale: Hurts even more Pain Location: L LE Pain Descriptors / Indicators: Aching;Grimacing;Guarding Pain Intervention(s): Limited activity within patient's tolerance;Monitored during session;Premedicated before session;Repositioned  Home Living                                          Prior Functioning/Environment              Frequency  Min 2X/week        Progress Toward Goals  OT Goals(current goals can now be found in the care plan section)  Progress towards  OT goals: Progressing toward goals     Plan      Co-evaluation                 AM-PAC OT "6 Clicks" Daily Activity     Outcome Measure   Help from another person eating meals?: None Help from another person taking care of personal grooming?: A Little Help from another person toileting, which includes using toliet, bedpan, or urinal?: A Little Help from another person bathing (including washing, rinsing, drying)?: A Little Help from another person to put on and taking off regular upper body clothing?: A Little Help from another person to put on and taking off regular lower body clothing?: A Lot 6 Click Score: 18    End of Session    OT Visit Diagnosis: Pain Pain - Right/Left: Left Pain - part of body: Hip   Activity Tolerance Patient tolerated treatment well   Patient Left in chair;with call bell/phone within reach;with chair alarm set   Nurse Communication          Time: 3664-4034 OT Time Calculation (min): 31 min  Charges: OT General Charges $OT Visit: 1 Visit OT Treatments $Self Care/Home Management : 23-37 mins  Lesle Chris, OTR/L Acute Rehabilitation Services [phone] [facility] pager [phone] office 08/12/2018   [place] 08/12/2018, 12:41  PM

## 2018-08-12 NOTE — Progress Notes (Addendum)
Physical Therapy Treatment Patient Details Name: John Reilly MRN: [id number] DOB: [birth year] Today's Date: 08/12/2018    History of Present Illness 63 yo male transferred from [place] s/p L IM nail after hip fx. Pt IS COVID 19 POSITIVE- low risk.  PMH:  Parkinson's, DM    PT Comments    Progressing slowly with mobility. Plan is currently for SNF however this may have to change due to (+) COVID test result. If pt continues to progress well, he could potentially d/c home.   Follow Up Recommendations  SNF(depending on progress)     Equipment Recommendations  Rolling walker with 5" wheels    Recommendations for Other Services       Precautions / Restrictions Precautions Precautions: Fall Restrictions Weight Bearing Restrictions: No LLE Weight Bearing: Weight bearing as tolerated    Mobility  Bed Mobility Overal bed mobility: Needs Assistance Bed Mobility: Supine to Sit;Sit to Supine     Supine to sit: Min assist;HOB elevated Sit to supine: Min assist;HOB elevated   General bed mobility comments: Assist for LLE. Increased time. Cues for safety, technique  Transfers Overall transfer level: Needs assistance Equipment used: Rolling walker (2 wheeled) Transfers: Sit to/from [facility] Sit to Stand: Min assist;From elevated surface Stand pivot transfers: Min assist       General transfer comment: VCs safety, technique, hand placement. Assist to rise, stabilize, control descent. Stand pivot, bed to bsc, with RW  Ambulation/Gait Ambulation/Gait assistance: Min assist Gait Distance (Feet): 17 Feet Assistive device: Rolling walker (2 wheeled) Gait Pattern/deviations: Step-to pattern;Antalgic     General Gait Details: VCs safety, technique, sequence. Assist to stabilize pt throughout short distance-limited by restrictions, IV line, and pain   Stairs             Wheelchair Mobility    Modified Rankin (Stroke Patients Only)       Balance  Overall balance assessment: Needs assistance;History of Falls         Standing balance support: Bilateral upper extremity supported Standing balance-[name]: Poor                              Cognition Arousal/Alertness: Awake/alert Behavior During Therapy: WFL for tasks assessed/performed Overall Cognitive Status: Within Functional Limits for tasks assessed                                        Exercises General Exercises - Lower Extremity Ankle Circles/Pumps: AROM;Both;10 reps;Supine Quad Sets: AROM;Both;10 reps;Supine Heel Slides: AAROM;Left;10 reps;Supine Hip ABduction/ADduction: AAROM;Left;10 reps;Supine    General Comments        Pertinent Vitals/Pain Pain Assessment: 0-10 Pain Score: 7  Faces Pain Scale: Hurts even more Pain Location: L LE Pain Descriptors / Indicators: Aching;Grimacing;Guarding Pain Intervention(s): Limited activity within patient's tolerance;Repositioned    Home Living                      Prior Function            PT Goals (current goals can now be found in the care plan section) Progress towards PT goals: Progressing toward goals    Frequency    Min 5X/week      PT Plan Current plan remains appropriate    Co-evaluation  AM-PAC PT "6 Clicks" Mobility   Outcome Measure  Help needed turning from your back to your side while in a flat bed without using bedrails?: A Little Help needed moving from lying on your back to sitting on the side of a flat bed without using bedrails?: A Little Help needed moving to and from a bed to a chair (including a wheelchair)?: A Little Help needed standing up from a chair using your arms (e.g., wheelchair or bedside chair)?: A Little Help needed to walk in hospital room?: A Little Help needed climbing 3-5 steps with a railing? : A Lot 6 Click Score: 17    End of Session Equipment Utilized During Treatment: (gown, gloves, droplet mask,  glasses) Activity Tolerance: Patient limited by pain Patient left: in bed;with call bell/phone within reach;with bed alarm set   PT Visit Diagnosis: Muscle weakness (generalized) (M62.81);Difficulty in walking, not elsewhere classified (R26.2);Pain Pain - Right/Left: Left Pain - part of body: Hip     Time: 0930-1000 PT Time Calculation (min) (ACUTE ONLY): 30 min  Charges:  $Gait Training: 8-22 mins $Therapeutic Exercise: 8-22 mins                        Weston Anna, PT Acute Rehabilitation Services Pager: [phone] Office: [phone]

## 2018-08-13 LAB — GLUCOSE, CAPILLARY
Glucose-Capillary: 35 mg/dL — CL (ref 70–99)
Glucose-Capillary: 40 mg/dL — CL (ref 70–99)
Glucose-Capillary: 45 mg/dL — ABNORMAL LOW (ref 70–99)
Glucose-Capillary: 208 mg/dL — ABNORMAL HIGH (ref 70–99)
Glucose-Capillary: 220 mg/dL — ABNORMAL HIGH (ref 70–99)
Glucose-Capillary: 251 mg/dL — ABNORMAL HIGH (ref 70–99)

## 2018-08-13 LAB — GLUCOSE, RANDOM: Glucose, Bld: 103 mg/dL — ABNORMAL HIGH (ref 70–99)

## 2018-08-13 MED ORDER — INSULIN DETEMIR 100 UNIT/ML ~~LOC~~ SOLN
12.0000 [IU] | Freq: Every day | SUBCUTANEOUS | Status: DC
  Administered 2018-08-13: 12 [IU] via SUBCUTANEOUS
  Filled 2018-08-13 (×2): qty 0.12

## 2018-08-13 MED ORDER — SENNOSIDES-DOCUSATE SODIUM 8.6-50 MG PO TABS: 1.0000 | ORAL_TABLET | Freq: Every evening | ORAL | Status: DC | PRN

## 2018-08-13 NOTE — Progress Notes (Signed)
Physical Therapy Treatment Patient Details Name: John Reilly MRN: [id number] DOB: 1956-01-17 Today's Date: 08/13/2018    History of Present Illness 63 yo male transferred from [place] s/p L IM nail after hip fx. Pt IS COVID 19 POSITIVE- low risk.  PMH:  Parkinson's, DM    PT Comments    Pt is progressing well, incr gait distance today although limited to room amb; pt only requiring light min assist to bring LLE in and out of bed, can teach use gait belt as leg lifter tomorrow; feel pt should be able to d/c home with HHPT.  Pt has also been getting up with nursing staff and doing well per bedside RN. Will continue to follow    Follow Up Recommendations  Home health PT;Supervision for mobility/OOB     Equipment Recommendations  Rolling walker with 5" wheels    Recommendations for Other Services       Precautions / Restrictions Precautions Precautions: Fall Restrictions LLE Weight Bearing: Weight bearing as tolerated    Mobility  Bed Mobility Overal bed mobility: Needs Assistance Bed Mobility: Supine to Sit;Sit to Supine     Supine to sit: Min guard;Min assist Sit to supine: Min assist   General bed mobility comments: light assist to bring LLE into bed, cues for technique, incr time, bed flat  Transfers Overall transfer level: Needs assistance Equipment used: Rolling walker (2 wheeled) Transfers: Sit to/from Stand Sit to Stand: Min guard         General transfer comment: VCs safety, technique, hand placement. Assist to rise, stabilize, control descent  Ambulation/Gait Ambulation/Gait assistance: Min guard Gait Distance (Feet): 40 Feet Assistive device: Rolling walker (2 wheeled) Gait Pattern/deviations: Step-to pattern;Antalgic     General Gait Details: cues for sequence, RW position, amb distance performed in room   Stairs             Wheelchair Mobility    Modified Rankin (Stroke Patients Only)       Balance   Sitting-balance support: No  upper extremity supported;Feet supported Sitting balance-[name]: Good     Standing balance support: No upper extremity supported;During functional activity Standing balance-[name]: Fair(using urinal, hand washing)                              Cognition Arousal/Alertness: Awake/alert Behavior During Therapy: WFL for tasks assessed/performed Overall Cognitive Status: Within Functional Limits for tasks assessed                                        Exercises      General Comments        Pertinent Vitals/Pain Pain Assessment: 0-10 Pain Score: 3  Pain Location: L LE Pain Descriptors / Indicators: Aching;Grimacing;Guarding Pain Intervention(s): Limited activity within patient's tolerance;Monitored during session;Repositioned;Patient requesting pain meds-RN notified    Home Living                      Prior Function            PT Goals (current goals can now be found in the care plan section) Acute Rehab PT Goals Patient Stated Goal: to get moving PT Goal Formulation: With patient Time For Goal Achievement: 08/23/18 Potential to Achieve Goals: Good Progress towards PT goals: Progressing toward goals    Frequency    Min  5X/week      PT Plan Discharge plan needs to be updated    Co-evaluation              AM-PAC PT "6 Clicks" Mobility   Outcome Measure  Help needed turning from your back to your side while in a flat bed without using bedrails?: A Little Help needed moving from lying on your back to sitting on the side of a flat bed without using bedrails?: A Little Help needed moving to and from a bed to a chair (including a wheelchair)?: A Little Help needed standing up from a chair using your arms (e.g., wheelchair or bedside chair)?: A Little Help needed to walk in hospital room?: A Little Help needed climbing 3-5 steps with a railing? : A Little 6 Click Score: 18    End of Session Equipment Utilized  During Treatment: Gait belt Activity Tolerance: Patient tolerated treatment well Patient left: in bed;with call bell/phone within reach;with bed alarm set Nurse Communication: Mobility status PT Visit Diagnosis: Muscle weakness (generalized) (M62.81);Difficulty in walking, not elsewhere classified (R26.2);Pain Pain - Right/Left: Left Pain - part of body: Hip     Time: 9702-6378 PT Time Calculation (min) (ACUTE ONLY): 0 min  Charges:  $Gait Training: 8-22 mins $Therapeutic Activity: 8-22 mins                     Kenyon Ana, PT  Pager: [phone] Acute Rehab Dept [facility]): [phone]   08/13/2018    [facility] 08/13/2018, 4:45 PM

## 2018-08-13 NOTE — Progress Notes (Signed)
PROGRESS NOTE    John Reilly  WUJ:[id number] DOB: [birth year] DOA: 08/10/2018 PCP: Glendon Axe, MD  Brief Narrative: John Reilly is a 63 y.o. male with medical history significant for Parkinson disease, type 1 diabetes who presented to [facility] as a transfer from [facility] due to a positive COVID-19 test.  Patient was recently admitted at [facility] on 08/07/2018 after a mechanical fall resulting in left hip fracture.  He had a left hip repair done on 08/08/2018.  Hospital course complicated by DKA for which he was treated.  h/o intermittent dry cough.  Positive COVID-19.  Assessment & Plan:   Left hip fracture post mechanical fall status post left hip repair, POA Presented at [facility] post mechanical fall with left hip fracture -Left hip repair on 08/08/2018 -Lovenox for Dvt prophylaxis -Pain is controlled, evaluated by physical therapy couple of days ago now ambulating with a walker in the room -Needs SNF for rehab unfortunately this continues to be challenging given positive Covid 19 -remains stable at this time  Positive Covid-19 infection -without hypoxia, only mild cough at this time -Supportive care -currently on droplet/contact isolation per infection prevention/[place] guidelines  Type 2DM -Recently treated for DKA at [facility] hyperglycemic, this morning but asymptomatic we will decrease Levemir dose reportedly had decreased p.o. intake last night -remains on sensitive NovoLog scale  History of Parkinson's disease  -Continue home regimen of Sinemet  Recently treated UTI -completed 5days of Abx, DC all Abx  Essential hypertension -BP stable continue amlodipine  Anemia  -Recent surgery/perioperative blood loss and hemodilution -No overt bleeding, monitor  DVT prophylaxis: Subcu Lovenox daily  Code Status: Full code  Family Communication: None at bedside, called and updated wife  Disposition Plan: Needs SNF  which is challenging with him being Covid Positive  Consults called: None   Procedures:   Antimicrobials:    Subjective: -Okay, no complaints, asked me why he is not on hydroxychloroquine -Denies any dyspnea, ambulating with physical therapy -Had 2 BMs yesterday and hence skipped this morning Senokot dose  Objective: Vitals:   08/12/18 0525 08/12/18 1320 08/12/18 2049 08/13/18 0452  BP: (!) 154/72 (!) 149/72 (!) 155/73 (!) 149/73  Pulse: 90 95 95 87  Resp: 14 16 17 17   Temp: 98.5 F (36.9 C) 97.8 F (36.6 C) 98.2 F (36.8 C) 98.1 F (36.7 C)  TempSrc: Oral Oral Oral Oral  SpO2: 100% 100% 99% 100%  Weight:      Height:        Intake/Output Summary (Last 24 hours) at 08/13/2018 1107 Last data filed at 08/13/2018 1031 Gross per 24 hour  Intake 1320 ml  Output 1975 ml  Net -655 ml   Filed Weights   08/10/18 1827  Weight: 77.6 kg    Examination: Gen: Alert, oriented to self and place, mild cognitive deficits noted HEENT: PERRLA, Neck supple, no JVD Lungs: Good air movement, clear bilaterally CVS: RRR,No Gallops,Rubs or new Murmurs Abd: soft, Non tender, non distended, BS present Extremities: Left hip with dressing, clean/dry/intact  skin: no new rashes Psychiatry: flat affect     Data Reviewed:   CBC: Recent Labs  Lab 08/07/18 1506 08/08/18 0555 08/09/18 0519 08/11/18 0345 08/12/18 0333  WBC 15.3* 14.8* 10.9* 5.7 5.9  NEUTROABS 13.9*  --  8.6* 4.1  --   HGB 10.9* 8.7* 8.2* 7.9* 7.9*  HCT 32.9* 25.6* 24.9* 25.3* 25.3*  MCV 95.4 92.4 95.4 98.8 98.8  PLT 423* 339  283 340 786   Basic Metabolic Panel: Recent Labs  Lab 08/08/18 0206 08/09/18 0519 08/10/18 0548 08/11/18 0345 08/12/18 0333 08/13/18 0817  NA 126* 132* 132* 131* 131*  --   K 4.0 3.9 4.2 5.3* 5.2*  --   CL 95* 99 100 98 98  --   CO2 22 24 26 26 26   --   GLUCOSE 123* 87 131* 161* 353* 103*  BUN 25* 17 12 14 17   --   CREATININE 1.39* 0.94 0.91 0.95 1.01  --   CALCIUM 7.7* 7.9* 8.0*  8.1* 8.0*  --    GFR: Estimated Creatinine Clearance: 77.3 mL/min (by C-G formula based on SCr of 1.01 mg/dL). Liver Function Tests: Recent Labs  Lab 08/07/18 1506 08/09/18 0519  AST 56* 44*  ALT 16 6  ALKPHOS 112 82  BILITOT 1.7* 0.5  PROT 7.1 5.7*  ALBUMIN 3.6 2.6*   No results for input(s): LIPASE, AMYLASE in the last 168 hours. No results for input(s): AMMONIA in the last 168 hours. Coagulation Profile: No results for input(s): INR, PROTIME in the last 168 hours. Cardiac Enzymes: Recent Labs  Lab 08/07/18 1506 08/08/18 0555  CKTOTAL 1,647* 904*   BNP (last 3 results) No results for input(s): PROBNP in the last 8760 hours. HbA1C: Recent Labs    08/11/18 0345  HGBA1C 7.1*   CBG: Recent Labs  Lab 08/12/18 2046 08/13/18 0735 08/13/18 0737 08/13/18 0756 08/13/18 0801  GLUCAP 235* 43* 40* 35* 45*   Lipid Profile: No results for input(s): CHOL, HDL, LDLCALC, TRIG, CHOLHDL, LDLDIRECT in the last 72 hours. Thyroid Function Tests: No results for input(s): TSH, T4TOTAL, FREET4, T3FREE, THYROIDAB in the last 72 hours. Anemia Panel: No results for input(s): VITAMINB12, FOLATE, FERRITIN, TIBC, IRON, RETICCTPCT in the last 72 hours. Urine analysis:    Component Value Date/Time   COLORURINE YELLOW (A) 08/07/2018 1507   APPEARANCEUR HAZY (A) 08/07/2018 1507   LABSPEC 1.017 08/07/2018 1507   PHURINE 5.0 08/07/2018 1507   GLUCOSEU >=500 (A) 08/07/2018 1507   HGBUR LARGE (A) 08/07/2018 1507   BILIRUBINUR NEGATIVE 08/07/2018 1507   KETONESUR 80 (A) 08/07/2018 1507   PROTEINUR 100 (A) 08/07/2018 1507   NITRITE NEGATIVE 08/07/2018 1507   LEUKOCYTESUR NEGATIVE 08/07/2018 1507   Sepsis Labs: @LABRCNTIP (procalcitonin:4,lacticidven:4)  ) Recent Results (from the past 240 hour(s))  Blood Culture (routine x 2)     Status: None   Collection Time: 08/07/18  3:07 PM  Result Value Ref Range Status   Specimen Description BLOOD LEFT ARM  Final   Special Requests   Final     BOTTLES DRAWN AEROBIC AND ANAEROBIC Blood Culture results may not be optimal due to an excessive volume of blood received in culture bottles   Culture   Final    NO GROWTH 5 DAYS Performed at [facility], [address] [place], [place] [address]    Report Status 08/12/2018 FINAL  Final  Blood Culture (routine x 2)     Status: Abnormal   Collection Time: 08/07/18  3:07 PM  Result Value Ref Range Status   Specimen Description   Final    BLOOD RIGHT ARM Performed at [facility], [address], [place], [place] [address]    Special Requests   Final    BOTTLES DRAWN AEROBIC AND ANAEROBIC Blood Culture results may not be optimal due to an excessive volume of blood received in culture bottles Performed at [facility], [address]  [place], [place] [address]    Culture  Setup Time   Final    GRAM POSITIVE COCCI AEROBIC BOTTLE ONLY CRITICAL RESULT CALLED TO, READ BACK BY AND VERIFIED WITH: KAREN HAYES AT 1108 08/08/18 SDR    Culture (A)  Final    STAPHYLOCOCCUS SPECIES (COAGULASE NEGATIVE) THE SIGNIFICANCE OF ISOLATING THIS ORGANISM FROM A SINGLE SET OF BLOOD CULTURES WHEN MULTIPLE SETS ARE DRAWN IS UNCERTAIN. PLEASE NOTIFY THE MICROBIOLOGY DEPARTMENT WITHIN ONE WEEK IF SPECIATION AND SENSITIVITIES ARE REQUIRED. Performed at [facility], [place] [address], [place], [place] [address]    Report Status 08/10/2018 FINAL  Final  Urine culture     Status: Abnormal   Collection Time: 08/07/18  3:07 PM  Result Value Ref Range Status   Specimen Description   Final    URINE, RANDOM Performed at [facility], [address] [place], [place] [address]    Special Requests   Final    NONE Performed at [facility], [place],  [address]    Culture 80,000 COLONIES/mL KLEBSIELLA PNEUMONIAE (A)  Final   Report Status 08/09/2018 FINAL  Final   Organism ID, Bacteria KLEBSIELLA PNEUMONIAE (A)  Final      Susceptibility    Klebsiella pneumoniae - MIC*    AMPICILLIN >=32 RESISTANT Resistant     CEFAZOLIN <=4 SENSITIVE Sensitive     CEFTRIAXONE <=1 SENSITIVE Sensitive     CIPROFLOXACIN <=0.25 SENSITIVE Sensitive     GENTAMICIN <=1 SENSITIVE Sensitive     IMIPENEM <=0.25 SENSITIVE Sensitive     NITROFURANTOIN 32 SENSITIVE Sensitive     TRIMETH/SULFA <=20 SENSITIVE Sensitive     AMPICILLIN/SULBACTAM 8 SENSITIVE Sensitive     PIP/TAZO <=4 SENSITIVE Sensitive     Extended ESBL NEGATIVE Sensitive     * 80,000 COLONIES/mL KLEBSIELLA PNEUMONIAE  Blood Culture ID Panel (Reflexed)     Status: Abnormal   Collection Time: 08/07/18  3:07 PM  Result Value Ref Range Status   Enterococcus species NOT DETECTED NOT DETECTED Final   Listeria monocytogenes NOT DETECTED NOT DETECTED Final   Staphylococcus species DETECTED (A) NOT DETECTED Final    Comment: Methicillin (oxacillin) resistant coagulase negative staphylococcus. Possible blood culture contaminant (unless isolated from more than one blood culture draw or clinical case suggests pathogenicity). No antibiotic treatment is indicated for blood  culture contaminants. CRITICAL RESULT CALLED TO, READ BACK BY AND VERIFIED WITH:  KAREN HAYES AT 1108 08/08/18 SDR    Staphylococcus aureus (BCID) NOT DETECTED NOT DETECTED Final   Methicillin resistance DETECTED (A) NOT DETECTED Final    Comment: CRITICAL RESULT CALLED TO, READ BACK BY AND VERIFIED WITH:  KAREN HAYES AT 1108 08/08/18 SDR    Streptococcus species NOT DETECTED NOT DETECTED Final   Streptococcus agalactiae NOT DETECTED NOT DETECTED Final   Streptococcus pneumoniae NOT DETECTED NOT DETECTED Final   Streptococcus pyogenes NOT DETECTED NOT DETECTED Final   Acinetobacter baumannii NOT DETECTED NOT DETECTED Final   Enterobacteriaceae species NOT DETECTED NOT DETECTED Final   Enterobacter cloacae complex NOT DETECTED NOT DETECTED Final   Escherichia coli NOT DETECTED NOT DETECTED Final   Klebsiella oxytoca NOT  DETECTED NOT DETECTED Final   Klebsiella pneumoniae NOT DETECTED NOT DETECTED Final   Proteus species NOT DETECTED NOT DETECTED Final   Serratia marcescens NOT DETECTED NOT DETECTED Final   Haemophilus influenzae NOT DETECTED NOT DETECTED Final   Neisseria meningitidis NOT DETECTED NOT DETECTED Final   Pseudomonas aeruginosa NOT  DETECTED NOT DETECTED Final   Candida albicans NOT DETECTED NOT DETECTED Final   Candida glabrata NOT DETECTED NOT DETECTED Final   Candida krusei NOT DETECTED NOT DETECTED Final   Candida parapsilosis NOT DETECTED NOT DETECTED Final   Candida tropicalis NOT DETECTED NOT DETECTED Final    Comment: Performed at [facility], [address] [place], [place] [address]  Novel Coronavirus, NAA (hospital order; send-out to ref lab)     Status: Abnormal   Collection Time: 08/07/18  7:05 PM  Result Value Ref Range Status   SARS-CoV-2, NAA DETECTED (A) NOT DETECTED Final    Comment: Positive (Detected) results are indicative of active infection with SARS CoV 2. A positive result does not rule out bacterial infection or coinfection with other viruses. Detection of SARS CoV 2 viral RNA may not indicate that SARS CoV 2 is the causative agent for clinical symptoms. Positive and negative predictive values of testing are highly dependent on prevalence. False positive test results are more likely when prevalence is moderate to low. CRITICAL RESULT CALLED TO, READ BACK BY AND VERIFIED WITH: Mariann Laster RN [id number] 08/09/18 A BROWNING (NOTE) The expected result is Negative (Not Detected). The SARS CoV 2 test is intended for the presumptive qualitative  detection of nucleic acid from SARS CoV 2 in upper and lower  respiratory specimens. Testing methodology is real time RT PCR. Test results must be correlated with clinical presentation and  evaluated in the context of other laboratory and epidemiologic data.  Test performance can be affected because the epidemiology and     clinical spectrum of infection caused by SARS CoV 2 is not fully  known. For example, the optimum types of specimens to collect and  when during the course of infection these specimens are most likely  to contain detectable viral RNA may not be known. This test has not been Food and Drug Administration (FDA) cleared or  approved and has been authorized by FDA under an Emergency Use  Authorization (EUA). The test is only authorized for the duration of  the declaration that circumstances exist justifying the authorization  of emergency use of in vitro diagnostic tests for detection and or  diagnosis of SARS CoV 2 under Section 564(b)(1) of the Act, 21 U.S.C.  section [phone] 3(b)(1), unless the authorization is terminated or  revoked sooner. [facility] is certified under the  Clinical Laboratory Improvement Amendments of 1988 (CLIA), 42 U.S.C.  section [phone], to perform high complexity tests. Performed at [facility] [id number], [place], [place] [address] Laboratory Director: Loleta Books, MD Performed at [facility], [place] [address], [place], [place] [address]    Coronavirus Source NASOPHARYNGEAL  Final    Comment: Performed at [facility], [address] [place], [place] [address]  Respiratory Panel by PCR     Status: None   Collection Time: 08/07/18  7:05 PM  Result Value Ref Range Status   Adenovirus NOT DETECTED NOT DETECTED Final   Coronavirus 229E NOT DETECTED NOT DETECTED Final    Comment: (NOTE) The Coronavirus on the Respiratory Panel, DOES NOT test for the novel  Coronavirus (2019 nCoV)    Coronavirus HKU1 NOT DETECTED NOT DETECTED Final   Coronavirus NL63 NOT DETECTED NOT DETECTED Final   Coronavirus OC43 NOT DETECTED NOT DETECTED Final   Metapneumovirus NOT DETECTED NOT DETECTED Final   Rhinovirus / Enterovirus NOT DETECTED NOT DETECTED  Final   Influenza A NOT DETECTED NOT  DETECTED Final   Influenza B NOT DETECTED NOT DETECTED Final   Parainfluenza Virus 1 NOT DETECTED NOT DETECTED Final   Parainfluenza Virus 2 NOT DETECTED NOT DETECTED Final   Parainfluenza Virus 3 NOT DETECTED NOT DETECTED Final   Parainfluenza Virus 4 NOT DETECTED NOT DETECTED Final   Respiratory Syncytial Virus NOT DETECTED NOT DETECTED Final   Bordetella pertussis NOT DETECTED NOT DETECTED Final   Chlamydophila pneumoniae NOT DETECTED NOT DETECTED Final   Mycoplasma pneumoniae NOT DETECTED NOT DETECTED Final    Comment: Performed at [facility], [place] [address], [place], [place] [address]  MRSA PCR Screening     Status: Abnormal   Collection Time: 08/07/18  7:05 PM  Result Value Ref Range Status   MRSA by PCR POSITIVE (A) NEGATIVE Final    Comment:        The GeneXpert MRSA Assay (FDA approved for NASAL specimens only), is one component of a comprehensive MRSA colonization surveillance program. It is not intended to diagnose MRSA infection nor to guide or monitor treatment for MRSA infections. CRITICAL RESULT CALLED TO, READ BACK BY AND VERIFIED WITH: CALLED TO [place] @2052  08/07/2018 [facility] Performed at [facility], [address] [place], [place] [address]          Radiology Studies: No results found.      Scheduled Meds:  amLODipine  5 mg Oral Daily   carbidopa-levodopa  1 tablet Oral QHS   carbidopa-levodopa  1 tablet Oral TID   enoxaparin  40 mg Subcutaneous S56C   folic acid  1 mg Oral Daily   insulin aspart  0-9 Units Subcutaneous TID WC   insulin detemir  12 Units Subcutaneous QHS   metoprolol succinate  50 mg Oral Daily   multivitamin with minerals  1 tablet Oral Daily   pantoprazole  40 mg Oral Daily   thiamine  100 mg Oral Daily   Continuous Infusions:    LOS: 3 days    Time spent: 43min  Domenic Polite, MD [facility]  08/13/2018, 11:07 AM

## 2018-08-13 NOTE — Plan of Care (Signed)
Patient up to chair with one assist and walker, tolerating carb mod diet, medicated for left hip pain x 3 this shift with improvement.

## 2018-08-13 NOTE — Progress Notes (Signed)
Patients CBG continues to read hypoglycemic, patient completely asymptomatic and is currently eating his breakfast.  Random glucose ordered stat at this time.

## 2018-08-13 NOTE — Progress Notes (Signed)
MD notified on rounds of patients hypoglycemic episode this a.m.

## 2018-08-13 NOTE — Progress Notes (Signed)
Patients CBG noted to be 45 this morning, patient is completely asymptomatic and does not feel this is correct.  Rechecked with another machine with similar reading.  Had patient drink orange juice and breakfast is on the way.  Will recheck in 15 minutes as per policy.

## 2018-08-14 LAB — GLUCOSE, CAPILLARY
Glucose-Capillary: 171 mg/dL — ABNORMAL HIGH (ref 70–99)
Glucose-Capillary: 242 mg/dL — ABNORMAL HIGH (ref 70–99)
Glucose-Capillary: 309 mg/dL — ABNORMAL HIGH (ref 70–99)
Glucose-Capillary: 340 mg/dL — ABNORMAL HIGH (ref 70–99)

## 2018-08-14 MED ORDER — INSULIN DETEMIR 100 UNIT/ML ~~LOC~~ SOLN
15.0000 [IU] | Freq: Every day | SUBCUTANEOUS | Status: DC
  Administered 2018-08-14 – 2018-08-15 (×2): 15 [IU] via SUBCUTANEOUS
  Filled 2018-08-14 (×3): qty 0.15

## 2018-08-14 NOTE — Progress Notes (Addendum)
Physical Therapy Treatment Patient Details Name: John Reilly MRN: [id number] DOB: 1955-11-20 Today's Date: 08/14/2018    History of Present Illness 63 yo male transferred from [place] s/p L IM nail after hip fx. Pt IS COVID 19 POSITIVE- low risk.  PMH:  Parkinson's, DM    PT Comments    Pt progressing well; reviewed HEP for pt to continue at home (assuming pt is unable to have HHPT d/t being Covid+); reviewed handout for going up/down step(s). Pt verbalizes technique and states grandson can assist; will continue to follow, feel pt is near ready for D/C from our standpoint  Follow Up Recommendations  Home health PT;Supervision for mobility/OOB     Equipment Recommendations  None recommended by PT;Other (comment)(pt has RW)    Recommendations for Other Services       Precautions / Restrictions Precautions Precautions: Fall Restrictions Weight Bearing Restrictions: No LLE Weight Bearing: Weight bearing as tolerated    Mobility  Bed Mobility Overal bed mobility: Needs Assistance Bed Mobility: Supine to Sit;Sit to Supine     Supine to sit: Supervision Sit to supine: Supervision   General bed mobility comments: no physical assist using gait belt as leg lifter, cues for technique, bed flat  Transfers Overall transfer level: Needs assistance Equipment used: Rolling walker (2 wheeled) Transfers: Sit to/from Stand Sit to Stand: Supervision         General transfer comment: for safety  Ambulation/Gait Ambulation/Gait assistance: Supervision Gait Distance (Feet): 50 Feet(in room) Assistive device: Rolling walker (2 wheeled) Gait Pattern/deviations: Step-through pattern;Decreased stance time - left     General Gait Details: cues for sequence, RW position, amb distance performed in room   Stairs             Wheelchair Mobility    Modified Rankin (Stroke Patients Only)       Balance   Sitting-balance support: No upper extremity supported;Feet  supported Sitting balance-[name]: Good     Standing balance support: No upper extremity supported;During functional activity Standing balance-[name]: Fair(using urinal, hand washing)                              Cognition Arousal/Alertness: Awake/alert Behavior During Therapy: WFL for tasks assessed/performed Overall Cognitive Status: Within Functional Limits for tasks assessed                                        Exercises General Exercises - Lower Extremity Ankle Circles/Pumps: AROM;Both;10 reps;Supine Quad Sets: AROM;Both;10 reps;Supine Heel Slides: AAROM;Left;10 reps;Supine(using gait belt to self assist ) Hip ABduction/ADduction: AAROM;Left;10 reps;Supine    General Comments        Pertinent Vitals/Pain Pain Assessment: 0-10 Pain Score: 9  Pain Location: L LE Pain Descriptors / Indicators: Aching;Grimacing;Guarding Pain Intervention(s): Limited activity within patient's tolerance;Monitored during session;Premedicated before session;Repositioned    Home Living                      Prior Function            PT Goals (current goals can now be found in the care plan section) Acute Rehab PT Goals Patient Stated Goal: to get moving PT Goal Formulation: With patient Time For Goal Achievement: 08/23/18 Potential to Achieve Goals: Good Progress towards PT goals: Progressing toward goals    Frequency  Min 5X/week      PT Plan Discharge plan needs to be updated    Co-evaluation              AM-PAC PT "6 Clicks" Mobility   Outcome Measure  Help needed turning from your back to your side while in a flat bed without using bedrails?: A Little Help needed moving from lying on your back to sitting on the side of a flat bed without using bedrails?: A Little Help needed moving to and from a bed to a chair (including a wheelchair)?: A Little Help needed standing up from a chair using your arms (e.g., wheelchair  or bedside chair)?: A Little Help needed to walk in hospital room?: A Little Help needed climbing 3-5 steps with a railing? : A Little 6 Click Score: 18    End of Session Equipment Utilized During Treatment: Gait belt Activity Tolerance: Patient tolerated treatment well Patient left: in bed;with call bell/phone within reach;with bed alarm set Nurse Communication: Mobility status PT Visit Diagnosis: Muscle weakness (generalized) (M62.81);Difficulty in walking, not elsewhere classified (R26.2);Pain Pain - Right/Left: Left Pain - part of body: Hip     Time: 6834-1962 PT Time Calculation (min) (ACUTE ONLY): 25 min  Charges:  $Gait Training: 8-22 mins $Therapeutic Exercise: 8-22 mins                     Kenyon Ana, PT  Pager: [phone] Acute Rehab Dept [facility]): [phone]   08/14/2018    [facility] 08/14/2018, 4:20 PM

## 2018-08-14 NOTE — Progress Notes (Signed)
PROGRESS NOTE    John Reilly  BPZ:[id number] DOB: November 30, 1955 DOA: 08/10/2018 PCP: Glendon Axe, MD  Brief Narrative: John Reilly is a 63 y.o. male with medical history significant for Parkinson disease, type 1 diabetes who presented to [facility] as a transfer from [facility] due to a positive COVID-19 test.  Patient was recently admitted at [facility] on 08/07/2018 after a mechanical fall resulting in left hip fracture.  He had a left hip repair done on 08/08/2018.  Hospital course complicated by DKA for which he was treated.  h/o intermittent dry cough.  Positive COVID-19.  Assessment & Plan:    Left hip fracture post mechanical fall status post left hip repair, POA -Presented at [facility] post mechanical fall with left hip fracture -Left hip repair on 08/08/2018 -Lovenox for Dvt prophylaxis -Pain is controlled, evaluated by physical therapy couple of days ago now ambulating with a walker in the room -Per physical therapy reevaluation today may be appropriate for discharge home with home health services, however this becomes problematic given Positive Covid test  Positive Covid-19 infection -without hypoxia, only mild cough at this time -Supportive care -currently on droplet/contact isolation per infection prevention/[place] guidelines  Type 2DM -Recently treated for DKA at [facility] hyperglycemic, this morning but asymptomatic we will decrease Levemir dose reportedly had decreased p.o. intake last night -remains on sensitive NovoLog scale  History of Parkinson's disease  -Continue home regimen of Sinemet  Recently treated UTI -completed 5days of Abx, DC all Abx  Essential hypertension -BP stable continue amlodipine  Anemia  -Recent surgery/perioperative blood loss and hemodilution -No overt bleeding, monitor  DVT prophylaxis: Subcu Lovenox daily  Code Status: Full code  Family Communication: None at bedside, called and  updated wife  Disposition Plan:  Originally SNF was recommended, this afternoon PT recommended home health physical therapy, doubt this would be possible given being positive for Covid   Consults called: None   Procedures:   Antimicrobials:    Subjective: -remains stable, no events overnight  Objective: Vitals:   08/13/18 0452 08/13/18 1424 08/13/18 2038 08/14/18 0605  BP: (!) 149/73 131/66 (!) 149/67 (!) 144/82  Pulse: 87 81 88 97  Resp: 17 18 17 18   Temp: 98.1 F (36.7 C) 98.4 F (36.9 C) 97.8 F (36.6 C) 97.7 F (36.5 C)  TempSrc: Oral Oral Oral Oral  SpO2: 100% 99% 100% 100%  Weight:      Height:        Intake/Output Summary (Last 24 hours) at 08/14/2018 1257 Last data filed at 08/14/2018 1205 Gross per 24 hour  Intake 360 ml  Output 800 ml  Net -440 ml   Filed Weights   08/10/18 1827  Weight: 77.6 kg    Examination:  Gen: Awake, Alert, Oriented X 3,  HEENT: PERRLA, Neck supple, no JVD Lungs: Good air movement bilaterally, CTAB CVS: RRR,No Gallops,Rubs or new Murmurs Abd: soft, Non tender, non distended, BS present Extremities: No Cyanosis, Clubbing or edema Skin: no new rashes Psychiatry: flat affect     Data Reviewed:   CBC: Recent Labs  Lab 08/07/18 1506 08/08/18 0555 08/09/18 0519 08/11/18 0345 08/12/18 0333  WBC 15.3* 14.8* 10.9* 5.7 5.9  NEUTROABS 13.9*  --  8.6* 4.1  --   HGB 10.9* 8.7* 8.2* 7.9* 7.9*  HCT 32.9* 25.6* 24.9* 25.3* 25.3*  MCV 95.4 92.4 95.4 98.8 98.8  PLT 423* 339 283 340 242   Basic Metabolic Panel: Recent Labs  Lab 08/08/18 0206 08/09/18 0519 08/10/18 0548 08/11/18 0345 08/12/18 0333 08/13/18 0817  NA 126* 132* 132* 131* 131*  --   K 4.0 3.9 4.2 5.3* 5.2*  --   CL 95* 99 100 98 98  --   CO2 22 24 26 26 26   --   GLUCOSE 123* 87 131* 161* 353* 103*  BUN 25* 17 12 14 17   --   CREATININE 1.39* 0.94 0.91 0.95 1.01  --   CALCIUM 7.7* 7.9* 8.0* 8.1* 8.0*  --    GFR: Estimated Creatinine Clearance: 77.3  mL/min (by C-G formula based on SCr of 1.01 mg/dL). Liver Function Tests: Recent Labs  Lab 08/07/18 1506 08/09/18 0519  AST 56* 44*  ALT 16 6  ALKPHOS 112 82  BILITOT 1.7* 0.5  PROT 7.1 5.7*  ALBUMIN 3.6 2.6*   No results for input(s): LIPASE, AMYLASE in the last 168 hours. No results for input(s): AMMONIA in the last 168 hours. Coagulation Profile: No results for input(s): INR, PROTIME in the last 168 hours. Cardiac Enzymes: Recent Labs  Lab 08/07/18 1506 08/08/18 0555  CKTOTAL 1,647* 904*   BNP (last 3 results) No results for input(s): PROBNP in the last 8760 hours. HbA1C: No results for input(s): HGBA1C in the last 72 hours. CBG: Recent Labs  Lab 08/13/18 1128 08/13/18 1658 08/13/18 2050 08/14/18 0816 08/14/18 1152  GLUCAP 208* 220* 251* 171* 242*   Lipid Profile: No results for input(s): CHOL, HDL, LDLCALC, TRIG, CHOLHDL, LDLDIRECT in the last 72 hours. Thyroid Function Tests: No results for input(s): TSH, T4TOTAL, FREET4, T3FREE, THYROIDAB in the last 72 hours. Anemia Panel: No results for input(s): VITAMINB12, FOLATE, FERRITIN, TIBC, IRON, RETICCTPCT in the last 72 hours. Urine analysis:    Component Value Date/Time   COLORURINE YELLOW (A) 08/07/2018 1507   APPEARANCEUR HAZY (A) 08/07/2018 1507   LABSPEC 1.017 08/07/2018 1507   PHURINE 5.0 08/07/2018 1507   GLUCOSEU >=500 (A) 08/07/2018 1507   HGBUR LARGE (A) 08/07/2018 1507   BILIRUBINUR NEGATIVE 08/07/2018 1507   KETONESUR 80 (A) 08/07/2018 1507   PROTEINUR 100 (A) 08/07/2018 1507   NITRITE NEGATIVE 08/07/2018 1507   LEUKOCYTESUR NEGATIVE 08/07/2018 1507   Sepsis Labs: @LABRCNTIP (procalcitonin:4,lacticidven:4)  ) Recent Results (from the past 240 hour(s))  Blood Culture (routine x 2)     Status: None   Collection Time: 08/07/18  3:07 PM  Result Value Ref Range Status   Specimen Description BLOOD LEFT ARM  Final   Special Requests   Final    BOTTLES DRAWN AEROBIC AND ANAEROBIC Blood Culture  results may not be optimal due to an excessive volume of blood received in culture bottles   Culture   Final    NO GROWTH 5 DAYS Performed at [facility], [address] [place], [place] [address]    Report Status 08/12/2018 FINAL  Final  Blood Culture (routine x 2)     Status: Abnormal   Collection Time: 08/07/18  3:07 PM  Result Value Ref Range Status   Specimen Description   Final    BLOOD RIGHT ARM Performed at [facility], [address] [place], [place] [address]    Special Requests   Final    BOTTLES DRAWN AEROBIC AND ANAEROBIC Blood Culture results may not be optimal due to an excessive volume of blood received in culture bottles Performed at [facility], [address], [place], [place] [address]    Culture  Setup Time   Final  GRAM POSITIVE COCCI AEROBIC BOTTLE ONLY CRITICAL RESULT CALLED TO, READ BACK BY AND VERIFIED WITH: [place] AT 1108 08/08/18 SDR    Culture (A)  Final    STAPHYLOCOCCUS SPECIES (COAGULASE NEGATIVE) THE SIGNIFICANCE OF ISOLATING THIS ORGANISM FROM A SINGLE SET OF BLOOD CULTURES WHEN MULTIPLE SETS ARE DRAWN IS UNCERTAIN. PLEASE NOTIFY THE MICROBIOLOGY DEPARTMENT WITHIN ONE WEEK IF SPECIATION AND SENSITIVITIES ARE REQUIRED. Performed at [facility], [place] [address], [place], [place] [address]    Report Status 08/10/2018 FINAL  Final  Urine culture     Status: Abnormal   Collection Time: 08/07/18  3:07 PM  Result Value Ref Range Status   Specimen Description   Final    URINE, RANDOM Performed at [facility], [address] [place], [place] [address]    Special Requests   Final    NONE Performed at [facility], [place], [place] [address]    Culture 80,000 COLONIES/mL KLEBSIELLA PNEUMONIAE (A)  Final   Report Status 08/09/2018 FINAL  Final   Organism ID, Bacteria KLEBSIELLA PNEUMONIAE (A)  Final      Susceptibility   Klebsiella pneumoniae - MIC*    AMPICILLIN  >=32 RESISTANT Resistant     CEFAZOLIN <=4 SENSITIVE Sensitive     CEFTRIAXONE <=1 SENSITIVE Sensitive     CIPROFLOXACIN <=0.25 SENSITIVE Sensitive     GENTAMICIN <=1 SENSITIVE Sensitive     IMIPENEM <=0.25 SENSITIVE Sensitive     NITROFURANTOIN 32 SENSITIVE Sensitive     TRIMETH/SULFA <=20 SENSITIVE Sensitive     AMPICILLIN/SULBACTAM 8 SENSITIVE Sensitive     PIP/TAZO <=4 SENSITIVE Sensitive     Extended ESBL NEGATIVE Sensitive     * 80,000 COLONIES/mL KLEBSIELLA PNEUMONIAE  Blood Culture ID Panel (Reflexed)     Status: Abnormal   Collection Time: 08/07/18  3:07 PM  Result Value Ref Range Status   Enterococcus species NOT DETECTED NOT DETECTED Final   Listeria monocytogenes NOT DETECTED NOT DETECTED Final   Staphylococcus species DETECTED (A) NOT DETECTED Final    Comment: Methicillin (oxacillin) resistant coagulase negative staphylococcus. Possible blood culture contaminant (unless isolated from more than one blood culture draw or clinical case suggests pathogenicity). No antibiotic treatment is indicated for blood  culture contaminants. CRITICAL RESULT CALLED TO, READ BACK BY AND VERIFIED WITH:  KAREN HAYES AT 1108 08/08/18 SDR    Staphylococcus aureus (BCID) NOT DETECTED NOT DETECTED Final   Methicillin resistance DETECTED (A) NOT DETECTED Final    Comment: CRITICAL RESULT CALLED TO, READ BACK BY AND VERIFIED WITH:  KAREN HAYES AT 1108 08/08/18 SDR    Streptococcus species NOT DETECTED NOT DETECTED Final   Streptococcus agalactiae NOT DETECTED NOT DETECTED Final   Streptococcus pneumoniae NOT DETECTED NOT DETECTED Final   Streptococcus pyogenes NOT DETECTED NOT DETECTED Final   Acinetobacter baumannii NOT DETECTED NOT DETECTED Final   Enterobacteriaceae species NOT DETECTED NOT DETECTED Final   Enterobacter cloacae complex NOT DETECTED NOT DETECTED Final   Escherichia coli NOT DETECTED NOT DETECTED Final   Klebsiella oxytoca NOT DETECTED NOT DETECTED Final   Klebsiella  pneumoniae NOT DETECTED NOT DETECTED Final   Proteus species NOT DETECTED NOT DETECTED Final   Serratia marcescens NOT DETECTED NOT DETECTED Final   Haemophilus influenzae NOT DETECTED NOT DETECTED Final   Neisseria meningitidis NOT DETECTED NOT DETECTED Final   Pseudomonas aeruginosa NOT DETECTED NOT DETECTED Final   Candida albicans NOT DETECTED NOT DETECTED Final   Candida  glabrata NOT DETECTED NOT DETECTED Final   Candida krusei NOT DETECTED NOT DETECTED Final   Candida parapsilosis NOT DETECTED NOT DETECTED Final   Candida tropicalis NOT DETECTED NOT DETECTED Final    Comment: Performed at [facility], [address] [place], [place] [address]  Novel Coronavirus, NAA (hospital order; send-out to ref lab)     Status: Abnormal   Collection Time: 08/07/18  7:05 PM  Result Value Ref Range Status   SARS-CoV-2, NAA DETECTED (A) NOT DETECTED Final    Comment: Positive (Detected) results are indicative of active infection with SARS CoV 2. A positive result does not rule out bacterial infection or coinfection with other viruses. Detection of SARS CoV 2 viral RNA may not indicate that SARS CoV 2 is the causative agent for clinical symptoms. Positive and negative predictive values of testing are highly dependent on prevalence. False positive test results are more likely when prevalence is moderate to low. CRITICAL RESULT CALLED TO, READ BACK BY AND VERIFIED WITH: Mariann Laster RN 5427 08/09/18 A BROWNING (NOTE) The expected result is Negative (Not Detected). The SARS CoV 2 test is intended for the presumptive qualitative  detection of nucleic acid from SARS CoV 2 in upper and lower  respiratory specimens. Testing methodology is real time RT PCR. Test results must be correlated with clinical presentation and  evaluated in the context of other laboratory and epidemiologic data.  Test performance can be affected because the epidemiology and   clinical spectrum of infection caused by SARS  CoV 2 is not fully  known. For example, the optimum types of specimens to collect and  when during the course of infection these specimens are most likely  to contain detectable viral RNA may not be known. This test has not been Food and Drug Administration (FDA) cleared or  approved and has been authorized by FDA under an Emergency Use  Authorization (EUA). The test is only authorized for the duration of  the declaration that circumstances exist justifying the authorization  of emergency use of in vitro diagnostic tests for detection and or  diagnosis of SARS CoV 2 under Section 564(b)(1) of the Act, 21 U.S.C.  section [phone] 3(b)(1), unless the authorization is terminated or  revoked sooner. [facility] is certified under the  Clinical Laboratory Improvement Amendments of 1988 (CLIA), 42 U.S.C.  section [phone], to perform high complexity tests. Performed at [facility] [id number], [place], [place] [address] Laboratory Director: Loleta Books, MD Performed at [facility], [place] [address], [place], [place] [address]    Coronavirus Source NASOPHARYNGEAL  Final    Comment: Performed at [facility], [address] [place], [place] [address]  Respiratory Panel by PCR     Status: None   Collection Time: 08/07/18  7:05 PM  Result Value Ref Range Status   Adenovirus NOT DETECTED NOT DETECTED Final   Coronavirus 229E NOT DETECTED NOT DETECTED Final    Comment: (NOTE) The Coronavirus on the Respiratory Panel, DOES NOT test for the novel  Coronavirus (2019 nCoV)    Coronavirus HKU1 NOT DETECTED NOT DETECTED Final   Coronavirus NL63 NOT DETECTED NOT DETECTED Final   Coronavirus OC43 NOT DETECTED NOT DETECTED Final   Metapneumovirus NOT DETECTED NOT DETECTED Final   Rhinovirus / Enterovirus NOT DETECTED NOT DETECTED Final   Influenza A NOT DETECTED NOT DETECTED Final   Influenza B NOT DETECTED NOT  DETECTED Final   Parainfluenza Virus 1 NOT DETECTED NOT DETECTED Final   Parainfluenza Virus 2 NOT DETECTED NOT DETECTED Final   Parainfluenza Virus 3 NOT DETECTED NOT DETECTED Final   Parainfluenza Virus 4 NOT DETECTED NOT DETECTED Final   Respiratory Syncytial Virus NOT DETECTED NOT DETECTED Final   Bordetella pertussis NOT DETECTED NOT DETECTED Final   Chlamydophila pneumoniae NOT DETECTED NOT DETECTED Final   Mycoplasma pneumoniae NOT DETECTED NOT DETECTED Final    Comment: Performed at [facility], [place] [address], [place], [place] [address]  MRSA PCR Screening     Status: Abnormal   Collection Time: 08/07/18  7:05 PM  Result Value Ref Range Status   MRSA by PCR POSITIVE (A) NEGATIVE Final    Comment:        The GeneXpert MRSA Assay (FDA approved for NASAL specimens only), is one component of a comprehensive MRSA colonization surveillance program. It is not intended to diagnose MRSA infection nor to guide or monitor treatment for MRSA infections. CRITICAL RESULT CALLED TO, READ BACK BY AND VERIFIED WITH: CALLED TO FELICIA PREUDHOMME @2052  08/07/2018 [facility] Performed at [facility], [address], [place], [place] [address]          Radiology Studies: No results found.      Scheduled Meds:  amLODipine  5 mg Oral Daily   carbidopa-levodopa  1 tablet Oral QHS   carbidopa-levodopa  1 tablet Oral TID   enoxaparin  40 mg Subcutaneous S31R   folic acid  1 mg Oral Daily   insulin aspart  0-9 Units Subcutaneous TID WC   insulin detemir  12 Units Subcutaneous QHS   metoprolol succinate  50 mg Oral Daily   multivitamin with minerals  1 tablet Oral Daily   pantoprazole  40 mg Oral Daily   thiamine  100 mg Oral Daily   Continuous Infusions:    LOS: 4 days    Time spent: 58min  Domenic Polite, MD [facility]  08/14/2018, 12:57 PM

## 2018-08-15 LAB — GLUCOSE, CAPILLARY
Glucose-Capillary: 118 mg/dL — ABNORMAL HIGH (ref 70–99)
Glucose-Capillary: 328 mg/dL — ABNORMAL HIGH (ref 70–99)
Glucose-Capillary: 235 mg/dL — ABNORMAL HIGH (ref 70–99)
Glucose-Capillary: 177 mg/dL — ABNORMAL HIGH (ref 70–99)

## 2018-08-15 LAB — CBC
HCT: 25.3 % — ABNORMAL LOW (ref 39.0–52.0)
Hemoglobin: 7.8 g/dL — ABNORMAL LOW (ref 13.0–17.0)
MCH: 30.5 pg (ref 26.0–34.0)
MCHC: 30.8 g/dL (ref 30.0–36.0)
MCV: 98.8 fL (ref 80.0–100.0)
Platelets: 518 10*3/uL — ABNORMAL HIGH (ref 150–400)
RBC: 2.56 MIL/uL — ABNORMAL LOW (ref 4.22–5.81)
RDW: 12.7 % (ref 11.5–15.5)
WBC: 7.5 10*3/uL (ref 4.0–10.5)
nRBC: 0 % (ref 0.0–0.2)

## 2018-08-15 LAB — BASIC METABOLIC PANEL
Anion gap: 6 (ref 5–15)
BUN: 12 mg/dL (ref 8–23)
CO2: 30 mmol/L (ref 22–32)
Calcium: 8.4 mg/dL — ABNORMAL LOW (ref 8.9–10.3)
Chloride: 97 mmol/L — ABNORMAL LOW (ref 98–111)
Creatinine, Ser: 0.73 mg/dL (ref 0.61–1.24)
GFR calc Af Amer: >60 mL/min (ref >60)
GFR calc non Af Amer: >60 mL/min (ref >60)
Glucose, Bld: 144 mg/dL — ABNORMAL HIGH (ref 70–99)
Potassium: 4.1 mmol/L (ref 3.5–5.1)
Sodium: 133 mmol/L — ABNORMAL LOW (ref 135–145)

## 2018-08-15 MED ORDER — OXYCODONE HCL 5 MG PO TABS: 5.0000 mg | ORAL_TABLET | ORAL | 0 refills | Status: DC | PRN

## 2018-08-15 MED ORDER — ENOXAPARIN SODIUM 40 MG/0.4ML ~~LOC~~ SOLN: 40.0000 mg | SUBCUTANEOUS | 0 refills | Status: DC

## 2018-08-15 MED ORDER — INSULIN ASPART 100 UNIT/ML ~~LOC~~ SOLN
3.0000 [IU] | Freq: Three times a day (TID) | SUBCUTANEOUS | Status: DC
  Administered 2018-08-15 – 2018-08-16 (×3): 3 [IU] via SUBCUTANEOUS

## 2018-08-15 NOTE — Progress Notes (Signed)
Physical Therapy Treatment Patient Details Name: John Reilly MRN: [id number] DOB: 03/22/1956 Today's Date: 08/15/2018    History of Present Illness 62 yo male transferred from [place] [facility] L IM nail after hip fx. Pt IS COVID 19 POSITIVE- low risk.  PMH:  Parkinson's, DM    PT Comments    Progressing with mobility. Pt is planning to d/c home on tomorrow, if everything lines up. All education completed.    Follow Up Recommendations  Home health PT;Supervision for mobility/OOB     Equipment Recommendations  None recommended by PT(pt stated he has/is getting a walker)    Recommendations for Other Services       Precautions / Restrictions Precautions Precautions: Fall Restrictions Weight Bearing Restrictions: No LLE Weight Bearing: Weight bearing as tolerated    Mobility  Bed Mobility Overal bed mobility: Needs Assistance Bed Mobility: Supine to Sit;Sit to Supine     Supine to sit: Min guard Sit to supine: Min assist   General bed mobility comments: Assist for L LE. Pt uses gait belt as leg lifter to get LE off bed but needed help getting leg onto bed.   Transfers Overall transfer level: Needs assistance Equipment used: Rolling walker (2 wheeled) Transfers: Sit to/from Stand Sit to Stand: Supervision         General transfer comment: for safety  Ambulation/Gait Ambulation/Gait assistance: Supervision Gait Distance (Feet): 50 Feet Assistive device: Rolling walker (2 wheeled) Gait Pattern/deviations: Step-through pattern;Decreased stance time - left;Antalgic     General Gait Details: for safety. VCs safety, sequence, proper use of RW.    Stairs             Wheelchair Mobility    Modified Rankin (Stroke Patients Only)       Balance Overall balance assessment: Mild deficits observed, not formally tested           Standing balance-[name]: Poor                              Cognition Arousal/Alertness: Awake/alert Behavior  During Therapy: WFL for tasks assessed/performed Overall Cognitive Status: Within Functional Limits for tasks assessed                                        Exercises General Exercises - Lower Extremity Ankle Circles/Pumps: AROM;Both;10 reps;Supine Quad Sets: AROM;Both;10 reps;Supine Heel Slides: AAROM;Left;10 reps;Supine Hip ABduction/ADduction: AAROM;Left;10 reps;Supine    General Comments        Pertinent Vitals/Pain Pain Assessment: 0-10 Pain Score: 7  Pain Location: L LE Pain Descriptors / Indicators: Aching;Grimacing;Guarding Pain Intervention(s): Monitored during session;Repositioned;RN gave pain meds during session    Home Living                      Prior Function            PT Goals (current goals can now be found in the care plan section) Progress towards PT goals: Progressing toward goals    Frequency    Min 5X/week      PT Plan Current plan remains appropriate    Co-evaluation              AM-PAC PT "6 Clicks" Mobility   Outcome Measure  Help needed turning from your back to your side while in a flat bed without using bedrails?:  A Little Help needed moving from lying on your back to sitting on the side of a flat bed without using bedrails?: A Little Help needed moving to and from a bed to a chair (including a wheelchair)?: A Little Help needed standing up from a chair using your arms (e.g., wheelchair or bedside chair)?: A Little Help needed to walk in hospital room?: A Little Help needed climbing 3-5 steps with a railing? : A Little 6 Click Score: 18    End of Session   Activity Tolerance: Patient tolerated treatment well Patient left: in bed;with call bell/phone within reach;with bed alarm set   PT Visit Diagnosis: Muscle weakness (generalized) (M62.81);Difficulty in walking, not elsewhere classified (R26.2);Pain Pain - Right/Left: Left Pain - part of body: Hip     Time: 6834-1962 PT Time Calculation  (min) (ACUTE ONLY): 30 min  Charges:  $Gait Training: 8-22 mins $Therapeutic Exercise: 8-22 mins                       Weston Anna, PT Acute Rehabilitation Services Pager: [phone] Office: [phone]

## 2018-08-15 NOTE — Progress Notes (Signed)
Inpatient Diabetes Program Recommendations  AACE/ADA: New Consensus Statement on Inpatient Glycemic Control (2015)  Target Ranges:  Prepandial:   less than 140 mg/dL      Peak postprandial:   less than 180 mg/dL (1-2 hours)      Critically ill patients:  140 - 180 mg/dL   Lab Results  Component Value Date   GLUCAP 118 (H) 08/15/2018   HGBA1C 7.1 (H) 08/11/2018    Review of Glycemic Control Results for John Reilly, John Reilly (MRN [id number]) as of 08/15/2018 10:21  Ref. Range 08/14/2018 08:16 08/14/2018 11:52 08/14/2018 16:42 08/14/2018 21:05 08/15/2018 08:04  Glucose-Capillary Latest Ref Range: 70 - 99 mg/dL 171 (H) 242 (H) 309 (H) 340 (H) 118 (H)   Inpatient Diabetes Program Recommendations:   -Novolog 3 units tid meal coverage if eats 50%  Thank you, Bethena Roys E. Lilliann Rossetti, RN, MSN, CDE  Diabetes Coordinator Inpatient Glycemic Control Team Team Pager [phone] (8am-5pm) 08/15/2018 10:21 AM

## 2018-08-15 NOTE — Progress Notes (Signed)
Pt has staples x 2 areas underneath honeycomb foam dressing. Postop date 3-30 Dr. Pascal Lux, MD Beeper #:  [phone] MD)--- Hospitalist MD update. SRP,RN

## 2018-08-15 NOTE — Progress Notes (Signed)
PROGRESS NOTE    John Reilly  UYQ:[id number] DOB: 1955/07/10 DOA: 08/10/2018 PCP: Glendon Axe, MD   Brief Narrative: 63 y.o.malewith medical history significant forParkinson disease, type 1 diabetes who presented to [facility] as a transfer from [facility] due toapositive COVID-19test. Patient was recently admitted at [facility] on 08/07/2018 after a mechanical fall resulting in left hip fracture. He had a left hip repair done on 08/08/2018. Hospital course complicated by DKA for which he was treated. h/o intermittent dry cough. Positive COVID-19.   Assessment & Plan:   Active Problems:   S/p left hip fracture  Left hip fracture post mechanical fall status post left hip repair, POA -[facility] post mechanical fall with left hip fracture -Left hip repair on 08/08/2018 -Lovenox for Dvt prophylaxis -Pain is controlled, evaluated by physical therapy couple of days ago now ambulating with a walker in the room -Per physical therapy reevaluation today may be appropriate for discharge home with home health services, however this becomes problematic given Positive Covid test -WAITING TO HEAR FROM HOME HEALTH TO SEE IF THEY CAN CONTINUE THERAPY AT [place]  Positive Covid-19 infection -without hypoxia, only mild cough at this time -Supportive care -currently on droplet/contact isolation per infection prevention/[place] guidelines  Type 2DM -Recently treated for DKA at [place] hyperglycemic, this morning but asymptomatic we will decrease Levemir dose reportedly had decreased p.o. intake last night -remains on sensitive NovoLog scale-ADD novolog 3 unit with meals  History of Parkinson's disease  -Continue home regimen of Sinemet Recently treated UTI -completed 5days of Abx, DC all Abx  Essential hypertension -BP stable continue amlodipine  Anemia  -Recent surgery/perioperative blood loss and  hemodilution -No overt bleeding, monitor  DVT prophylaxis:Subcu Lovenox daily  Code Status:Full code  Family Communication:dw wife  Disposition Plan: home with hh Consults called:None   Procedures: left hip IM NAIL  Antimicrobials: NONE   Estimated body mass index is 24.55 kg/m as calculated from the following:   Height as of this encounter: 5\' 10"  (1.778 m).   Weight as of this encounter: 77.6 kg.    Subjective: FEELS BETTER ANXIOUS  TO GO HOME Objective: Vitals:   08/14/18 0605 08/14/18 1330 08/14/18 2110 08/15/18 0527  BP: (!) 144/82 124/61 (!) 144/76 (!) 144/75  Pulse: 97 (!) 102 87 87  Resp: 18 18 18 18   Temp: 97.7 F (36.5 C) 98.3 F (36.8 C) 98.6 F (37 C) 98.3 F (36.8 C)  TempSrc: Oral Oral Oral Oral  SpO2: 100% 100% 97% 99%  Weight:      Height:        Intake/Output Summary (Last 24 hours) at 08/15/2018 0935 Last data filed at 08/15/2018 6387 Gross per 24 hour  Intake 720 ml  Output 900 ml  Net -180 ml   Filed Weights   08/10/18 1827  Weight: 77.6 kg    Examination:  General exam: Appears calm and comfortable  Respiratory system: Clear to auscultation. Respiratory effort normal. Cardiovascular system: S1 & S2 heard, RRR. No JVD, murmurs, rubs, gallops or clicks. No pedal edema. Gastrointestinal system: Abdomen is nondistended, soft and nontender. No organomegaly or masses felt. Normal bowel sounds heard. Central nervous system: Alert and oriented. No focal neurological deficits. Extremities: LEFT HIP STAPLES Skin: No rashes, lesions or ulcers Psychiatry: Judgement and insight appear normal. Mood & affect appropriate.     Data Reviewed: I have personally reviewed following labs and imaging studies  CBC: Recent  Labs  Lab 08/09/18 0519 08/11/18 0345 08/12/18 0333 08/15/18 0407  WBC 10.9* 5.7 5.9 7.5  NEUTROABS 8.6* 4.1  --   --   HGB 8.2* 7.9* 7.9* 7.8*  HCT 24.9* 25.3* 25.3* 25.3*  MCV 95.4 98.8 98.8 98.8  PLT 283 340  335 017*   Basic Metabolic Panel: Recent Labs  Lab 08/09/18 0519 08/10/18 0548 08/11/18 0345 08/12/18 0333 08/13/18 0817 08/15/18 0407  NA 132* 132* 131* 131*  --  133*  K 3.9 4.2 5.3* 5.2*  --  4.1  CL 99 100 98 98  --  97*  CO2 24 26 26 26   --  30  GLUCOSE 87 131* 161* 353* 103* 144*  BUN 17 12 14 17   --  12  CREATININE 0.94 0.91 0.95 1.01  --  0.73  CALCIUM 7.9* 8.0* 8.1* 8.0*  --  8.4*   GFR: Estimated Creatinine Clearance: 97.6 mL/min (by C-G formula based on SCr of 0.73 mg/dL). Liver Function Tests: Recent Labs  Lab 08/09/18 0519  AST 44*  ALT 6  ALKPHOS 82  BILITOT 0.5  PROT 5.7*  ALBUMIN 2.6*   No results for input(s): LIPASE, AMYLASE in the last 168 hours. No results for input(s): AMMONIA in the last 168 hours. Coagulation Profile: No results for input(s): INR, PROTIME in the last 168 hours. Cardiac Enzymes: No results for input(s): CKTOTAL, CKMB, CKMBINDEX, TROPONINI in the last 168 hours. BNP (last 3 results) No results for input(s): PROBNP in the last 8760 hours. HbA1C: No results for input(s): HGBA1C in the last 72 hours. CBG: Recent Labs  Lab 08/14/18 0816 08/14/18 1152 08/14/18 1642 08/14/18 2105 08/15/18 0804  GLUCAP 171* 242* 309* 340* 118*   Lipid Profile: No results for input(s): CHOL, HDL, LDLCALC, TRIG, CHOLHDL, LDLDIRECT in the last 72 hours. Thyroid Function Tests: No results for input(s): TSH, T4TOTAL, FREET4, T3FREE, THYROIDAB in the last 72 hours. Anemia Panel: No results for input(s): VITAMINB12, FOLATE, FERRITIN, TIBC, IRON, RETICCTPCT in the last 72 hours. Sepsis Labs: Recent Labs  Lab 08/09/18 0519 08/10/18 0548  PROCALCITON 1.18 0.40    Recent Results (from the past 240 hour(s))  Blood Culture (routine x 2)     Status: None   Collection Time: 08/07/18  3:07 PM  Result Value Ref Range Status   Specimen Description BLOOD LEFT ARM  Final   Special Requests   Final    BOTTLES DRAWN AEROBIC AND ANAEROBIC Blood Culture  results may not be optimal due to an excessive volume of blood received in culture bottles   Culture   Final    NO GROWTH 5 DAYS Performed at [facility], [address] [place], [place] [address]    Report Status 08/12/2018 FINAL  Final  Blood Culture (routine x 2)     Status: Abnormal   Collection Time: 08/07/18  3:07 PM  Result Value Ref Range Status   Specimen Description   Final    BLOOD RIGHT ARM Performed at [facility], [address] [place], [place] [address]    Special Requests   Final    BOTTLES DRAWN AEROBIC AND ANAEROBIC Blood Culture results may not be optimal due to an excessive volume of blood received in culture bottles Performed at [facility], [address], [place], [place] [address]    Culture  Setup Time   Final    GRAM POSITIVE COCCI AEROBIC BOTTLE ONLY CRITICAL RESULT CALLED TO, READ BACK BY AND VERIFIED WITH: KAREN  HAYES AT 4665 08/08/18 SDR    Culture (A)  Final    STAPHYLOCOCCUS SPECIES (COAGULASE NEGATIVE) THE SIGNIFICANCE OF ISOLATING THIS ORGANISM FROM A SINGLE SET OF BLOOD CULTURES WHEN MULTIPLE SETS ARE DRAWN IS UNCERTAIN. PLEASE NOTIFY THE MICROBIOLOGY DEPARTMENT WITHIN ONE WEEK IF SPECIATION AND SENSITIVITIES ARE REQUIRED. Performed at [facility], [place] [address], [place], [place] [address]    Report Status 08/10/2018 FINAL  Final  Urine culture     Status: Abnormal   Collection Time: 08/07/18  3:07 PM  Result Value Ref Range Status   Specimen Description   Final    URINE, RANDOM Performed at [facility], [address] [place], [place] [address]    Special Requests   Final    NONE Performed at [facility], [place], [place] [address]    Culture 80,000 COLONIES/mL KLEBSIELLA PNEUMONIAE (A)  Final   Report Status 08/09/2018 FINAL  Final   Organism ID, Bacteria KLEBSIELLA PNEUMONIAE (A)  Final      Susceptibility   Klebsiella pneumoniae - MIC*    AMPICILLIN  >=32 RESISTANT Resistant     CEFAZOLIN <=4 SENSITIVE Sensitive     CEFTRIAXONE <=1 SENSITIVE Sensitive     CIPROFLOXACIN <=0.25 SENSITIVE Sensitive     GENTAMICIN <=1 SENSITIVE Sensitive     IMIPENEM <=0.25 SENSITIVE Sensitive     NITROFURANTOIN 32 SENSITIVE Sensitive     TRIMETH/SULFA <=20 SENSITIVE Sensitive     AMPICILLIN/SULBACTAM 8 SENSITIVE Sensitive     PIP/TAZO <=4 SENSITIVE Sensitive     Extended ESBL NEGATIVE Sensitive     * 80,000 COLONIES/mL KLEBSIELLA PNEUMONIAE  Blood Culture ID Panel (Reflexed)     Status: Abnormal   Collection Time: 08/07/18  3:07 PM  Result Value Ref Range Status   Enterococcus species NOT DETECTED NOT DETECTED Final   Listeria monocytogenes NOT DETECTED NOT DETECTED Final   Staphylococcus species DETECTED (A) NOT DETECTED Final    Comment: Methicillin (oxacillin) resistant coagulase negative staphylococcus. Possible blood culture contaminant (unless isolated from more than one blood culture draw or clinical case suggests pathogenicity). No antibiotic treatment is indicated for blood  culture contaminants. CRITICAL RESULT CALLED TO, READ BACK BY AND VERIFIED WITH:  KAREN HAYES AT 1108 08/08/18 SDR    Staphylococcus aureus (BCID) NOT DETECTED NOT DETECTED Final   Methicillin resistance DETECTED (A) NOT DETECTED Final    Comment: CRITICAL RESULT CALLED TO, READ BACK BY AND VERIFIED WITH:  KAREN HAYES AT 1108 08/08/18 SDR    Streptococcus species NOT DETECTED NOT DETECTED Final   Streptococcus agalactiae NOT DETECTED NOT DETECTED Final   Streptococcus pneumoniae NOT DETECTED NOT DETECTED Final   Streptococcus pyogenes NOT DETECTED NOT DETECTED Final   Acinetobacter baumannii NOT DETECTED NOT DETECTED Final   Enterobacteriaceae species NOT DETECTED NOT DETECTED Final   Enterobacter cloacae complex NOT DETECTED NOT DETECTED Final   Escherichia coli NOT DETECTED NOT DETECTED Final   Klebsiella oxytoca NOT DETECTED NOT DETECTED Final   Klebsiella  pneumoniae NOT DETECTED NOT DETECTED Final   Proteus species NOT DETECTED NOT DETECTED Final   Serratia marcescens NOT DETECTED NOT DETECTED Final   Haemophilus influenzae NOT DETECTED NOT DETECTED Final   Neisseria meningitidis NOT DETECTED NOT DETECTED Final   Pseudomonas aeruginosa NOT DETECTED NOT DETECTED Final   Candida albicans NOT DETECTED NOT DETECTED Final   Candida glabrata NOT DETECTED NOT DETECTED Final   Candida krusei NOT DETECTED NOT DETECTED Final  Candida parapsilosis NOT DETECTED NOT DETECTED Final   Candida tropicalis NOT DETECTED NOT DETECTED Final    Comment: Performed at [facility], [address] [place], [place] [address]  Novel Coronavirus, NAA (hospital order; send-out to ref lab)     Status: Abnormal   Collection Time: 08/07/18  7:05 PM  Result Value Ref Range Status   SARS-CoV-2, NAA DETECTED (A) NOT DETECTED Final    Comment: Positive (Detected) results are indicative of active infection with SARS CoV 2. A positive result does not rule out bacterial infection or coinfection with other viruses. Detection of SARS CoV 2 viral RNA may not indicate that SARS CoV 2 is the causative agent for clinical symptoms. Positive and negative predictive values of testing are highly dependent on prevalence. False positive test results are more likely when prevalence is moderate to low. CRITICAL RESULT CALLED TO, READ BACK BY AND VERIFIED WITH: Mariann Laster RN [phone] 08/09/18 A BROWNING (NOTE) The expected result is Negative (Not Detected). The SARS CoV 2 test is intended for the presumptive qualitative  detection of nucleic acid from SARS CoV 2 in upper and lower  respiratory specimens. Testing methodology is real time RT PCR. Test results must be correlated with clinical presentation and  evaluated in the context of other laboratory and epidemiologic data.  Test performance can be affected because the epidemiology and   clinical spectrum of infection caused by SARS  CoV 2 is not fully  known. For example, the optimum types of specimens to collect and  when during the course of infection these specimens are most likely  to contain detectable viral RNA may not be known. This test has not been Food and Drug Administration (FDA) cleared or  approved and has been authorized by FDA under an Emergency Use  Authorization (EUA). The test is only authorized for the duration of  the declaration that circumstances exist justifying the authorization  of emergency use of in vitro diagnostic tests for detection and or  diagnosis of SARS CoV 2 under Section 564(b)(1) of the Act, 21 U.S.C.  section [phone] 3(b)(1), unless the authorization is terminated or  revoked sooner. [facility] is certified under the  Clinical Laboratory Improvement Amendments of 1988 (CLIA), 42 U.S.C.  section [phone], to perform high complexity tests. Performed at [facility] [id number], [place], [place] [address] Laboratory Director: Loleta Books, MD Performed at [facility], [place] [address] [place], [place] [address]    Coronavirus Source NASOPHARYNGEAL  Final    Comment: Performed at [facility], [address] [place], [place] [address]  Respiratory Panel by PCR     Status: None   Collection Time: 08/07/18  7:05 PM  Result Value Ref Range Status   Adenovirus NOT DETECTED NOT DETECTED Final   Coronavirus 229E NOT DETECTED NOT DETECTED Final    Comment: (NOTE) The Coronavirus on the Respiratory Panel, DOES NOT test for the novel  Coronavirus (2019 nCoV)    Coronavirus HKU1 NOT DETECTED NOT DETECTED Final   Coronavirus NL63 NOT DETECTED NOT DETECTED Final   Coronavirus OC43 NOT DETECTED NOT DETECTED Final   Metapneumovirus NOT DETECTED NOT DETECTED Final   Rhinovirus / Enterovirus NOT DETECTED NOT DETECTED Final   Influenza A NOT DETECTED NOT DETECTED Final   Influenza B NOT DETECTED NOT  DETECTED Final   Parainfluenza Virus 1 NOT DETECTED NOT DETECTED Final   Parainfluenza Virus  2 NOT DETECTED NOT DETECTED Final   Parainfluenza Virus 3 NOT DETECTED NOT DETECTED Final   Parainfluenza Virus 4 NOT DETECTED NOT DETECTED Final   Respiratory Syncytial Virus NOT DETECTED NOT DETECTED Final   Bordetella pertussis NOT DETECTED NOT DETECTED Final   Chlamydophila pneumoniae NOT DETECTED NOT DETECTED Final   Mycoplasma pneumoniae NOT DETECTED NOT DETECTED Final    Comment: Performed at [facility], [place] [address], [place], [place] [address]  MRSA PCR Screening     Status: Abnormal   Collection Time: 08/07/18  7:05 PM  Result Value Ref Range Status   MRSA by PCR POSITIVE (A) NEGATIVE Final    Comment:        The GeneXpert MRSA Assay (FDA approved for NASAL specimens only), is one component of a comprehensive MRSA colonization surveillance program. It is not intended to diagnose MRSA infection nor to guide or monitor treatment for MRSA infections. CRITICAL RESULT CALLED TO, READ BACK BY AND VERIFIED WITH: CALLED TO [place] @[phone]  08/07/2018 [facility] Performed at [facility], [address] [place], [place] [address]          Radiology Studies: No results found.      Scheduled Meds:  amLODipine  5 mg Oral Daily   carbidopa-levodopa  1 tablet Oral QHS   carbidopa-levodopa  1 tablet Oral TID   enoxaparin  40 mg Subcutaneous R42H   folic acid  1 mg Oral Daily   insulin aspart  0-9 Units Subcutaneous TID WC   insulin detemir  15 Units Subcutaneous QHS   metoprolol succinate  50 mg Oral Daily   multivitamin with minerals  1 tablet Oral Daily   pantoprazole  40 mg Oral Daily   thiamine  100 mg Oral Daily   Continuous Infusions:   LOS: 5 days     Georgette Shell, MD [facility]  If 7PM-7AM, please contact night-coverage [email or web address] Password [email or web address] 08/15/2018, 9:35 AM

## 2018-08-15 NOTE — Discharge Summary (Signed)
Physician Discharge Summary  John Reilly MCN:[id number] DOB: 1955/10/15 DOA: 08/10/2018  PCP: Glendon Axe, MD  Admit date: 08/10/2018 Discharge date: 08/15/2018  Admitted From: Home Disposition: Home  Recommendations for Outpatient Follow-up:  1. Follow up with PCP in 1-2 weeks 2. Please obtain BMP/CBC in one week 3 please follow-up with Ortho dr Dellis Filbert poggi  [facility] PT Equipment/Devices: None Discharge Condition stable and improved CODE STATUS full code Diet recommendation: Cardiac Brief/Interim Summary:62 y.o.malewith medical history significant forParkinson disease, type 1 diabetes who presented to [facility] as a transfer from [facility] due toapositive COVID-19test. Patient was recently admitted at [facility] on 08/07/2018 after a mechanical fall resulting in left hip fracture. He had a left hip repair done on 08/08/2018. Hospital course complicated by DKA for which he was treated. h/o intermittent dry cough. Positive COVID-19.    Discharge Diagnoses:  Active Problems:   S/p left hip fracture  Left hip fracture post mechanical fall status post left hip repair, POA -[facility] post mechanical fall with left hip fracture -Left hip repair on 08/08/2018 -Lovenox for Dvt prophylaxis -Patient will be discharged home with home PT and OT.[facility] KitchenBAYADA has kindly accepted this patient to follow at home PT and Deshler will be removed prior to discharge  Positive Covid-19 infection -without hypoxia, only mild cough at this time -Continue quarantine for 14 days  Type 2DM -Recently treated for DKA at [facility].  History of Parkinson's disease  -Continue home regimen of Sinemet  Recently treated UTI -completed 5days of Abx  Essential hypertension -BP stable continue amlodipine   acute blood loss anemia -Recent surgery/perioperative blood loss and hemodilution    Estimated body mass index is  24.55 kg/m as calculated from the following:   Height as of this encounter: 5\' 10"  (1.778 m).   Weight as of this encounter: 77.6 kg.  Discharge Instructions  Discharge Instructions    Face-to-face encounter (required for Medicare/Medicaid patients)   Complete by:  As directed    I Georgette Shell certify that this patient is under my care and that I, or a nurse practitioner or physician's assistant working with me, had a face-to-face encounter that meets the physician face-to-face encounter requirements with this patient on 08/15/2018. The encounter with the patient was in whole, or in part for the following medical condition(s) which is the primary reason for home health care (List medical condition): hip fracture   The encounter with the patient was in whole, or in part, for the following medical condition, which is the primary reason for home health care:  hip fracture   I certify that, based on my findings, the following services are medically necessary home health services:  Physical therapy   Reason for Medically Necessary Home Health Services:  Therapy- Personnel officer, Public librarian   My clinical findings support the need for the above services:  Unsafe ambulation due to balance issues   Further, I certify that my clinical findings support that this patient is homebound due to:  Unable to leave home safely without assistance   Home Health   Complete by:  As directed    To provide the following care/treatments:   PT OT       Allergies as of 08/15/2018      Reactions   Penicillins Rash   Has patient had a PCN reaction causing immediate rash, facial/tongue/throat swelling, SOB or lightheadedness with hypotension: Yes Has patient had a PCN reaction  causing severe rash involving mucus membranes or skin necrosis: No Has patient had a PCN reaction that required hospitalization: No Has patient had a PCN reaction occurring within the last 10 years: No If all of the  above answers are "NO", then may proceed with Cephalosporin use.      Medication List    STOP taking these medications   ceFAZolin 1-4 GM/50ML-% Soln Commonly known as:  ANCEF   Chlorhexidine Gluconate Cloth 2 % Pads   enoxaparin 40 MG/0.4ML injection Commonly known as:  LOVENOX   insulin aspart 100 UNIT/ML injection Commonly known as:  novoLOG   insulin detemir 100 UNIT/ML injection Commonly known as:  LEVEMIR   magnesium hydroxide 400 MG/5ML suspension Commonly known as:  MILK OF MAGNESIA     TAKE these medications   acetaminophen 325 MG tablet Commonly known as:  TYLENOL Take 2 tablets (650 mg total) by mouth every 4 (four) hours as needed for moderate pain (or Fever >/= 101).   amLODipine 5 MG tablet Commonly known as:  NORVASC Take 1 tablet by mouth daily as needed (BP OVER 160/90).   carbidopa-levodopa 50-200 MG tablet Commonly known as:  SINEMET CR Take 1 tablet by mouth at bedtime.   carbidopa-levodopa 25-250 MG tablet Commonly known as:  SINEMET IR Take 1 tablet by mouth 3 (three) times daily.   docusate sodium 100 MG capsule Commonly known as:  COLACE Take 1 capsule (100 mg total) by mouth 2 (two) times daily.   enalapril 5 MG tablet Commonly known as:  VASOTEC Take 5 mg by mouth daily.   folic acid 1 MG tablet Commonly known as:  FOLVITE Take 2 mg by mouth daily.   metoprolol succinate 50 MG 24 hr tablet Commonly known as:  TOPROL-XL Take 1 tablet (50 mg total) by mouth daily. Take with or immediately following a meal.   multivitamin with minerals Tabs tablet Take 1 tablet by mouth daily.   mupirocin ointment 2 % Commonly known as:  BACTROBAN Place 1 application into the nose 2 (two) times daily.   omeprazole 40 MG capsule Commonly known as:  PRILOSEC Take 40 mg by mouth daily.   oxyCODONE 5 MG immediate release tablet Commonly known as:  Oxy IR/ROXICODONE Take 1-2 tablets (5-10 mg total) by mouth every 4 (four) hours as needed for  severe pain (pain score 4-6).   Super B-Complex Tabs Take 1 tablet by mouth daily.   thiamine 100 MG tablet Take 1 tablet (100 mg total) by mouth daily.   traMADol 50 MG tablet Commonly known as:  ULTRAM Take 1 tablet (50 mg total) by mouth every 6 (six) hours as needed for moderate pain.      Follow-up Information    Glendon Axe, MD Follow up.   Specialty:  Internal Medicine Contact information: [place] [address] [phone]          Allergies  Allergen Reactions  . Penicillins Rash    Has patient had a PCN reaction causing immediate rash, facial/tongue/throat swelling, SOB or lightheadedness with hypotension: Yes Has patient had a PCN reaction causing severe rash involving mucus membranes or skin necrosis: No Has patient had a PCN reaction that required hospitalization: No Has patient had a PCN reaction occurring within the last 10 years: No If all of the above answers are "NO", then may proceed with Cephalosporin use.    Consultations:  None   Procedures/Studies: Dg Chest 1 View  Result Date: 08/07/2018 CLINICAL DATA:  Fall. EXAM: CHEST  1 VIEW COMPARISON:  Radiograph October 16, 2017. FINDINGS: The heart size and mediastinal contours are within normal limits. Both lungs are clear. No pneumothorax or pleural effusion is noted. Old left rib fractures are noted. IMPRESSION: No active disease. Electronically Signed   By: Marijo Conception, M.D.   On: 08/07/2018 15:51   Ct Head Wo Contrast  Result Date: 08/07/2018 CLINICAL DATA:  Fall at home.  Hyperglycemia.  Left facial bruising. EXAM: CT HEAD WITHOUT CONTRAST CT MAXILLOFACIAL WITHOUT CONTRAST CT CERVICAL SPINE WITHOUT CONTRAST TECHNIQUE: Multidetector CT imaging of the head, cervical spine, and maxillofacial structures were performed using the standard protocol without intravenous contrast. Multiplanar CT image reconstructions of the cervical spine and maxillofacial structures were also  generated. COMPARISON:  Multiple exams, including 10/02/2017 FINDINGS: CT HEAD FINDINGS Brain: The brainstem, cerebellum, cerebral peduncles, thalami, basal ganglia, basilar cisterns, and ventricular system appear within normal limits. Periventricular white matter and corona radiata hypodensities favor chronic ischemic microvascular white matter disease. The previous low-density left frontal extra-axial fluid prominence is no longer present. Vascular: Unremarkable Skull: Unremarkable Other: Left lateral periorbital soft tissue swelling. CT MAXILLOFACIAL FINDINGS Osseous: No facial fractures identified. The patient has several likely mandibular dental cavities. Orbits: No intraorbital/postseptal abnormality. The globes appear intact. Sinuses: Unremarkable Soft tissues: Left periorbital soft tissue swelling/subcutaneous hematoma, especially lateral. There is potentially some soft tissue swelling along the left chin. CT CERVICAL SPINE FINDINGS Alignment: No vertebral subluxation is observed. Skull base and vertebrae: No cervical spine fracture identified. No acute bony findings. Soft tissues and spinal canal: Right common carotid atherosclerotic calcification. Disc levels:  No bony impingement identified. Upper chest: Unremarkable Other: No supplemental non-categorized findings. IMPRESSION: 1. Left periorbital soft tissue swelling/subcutaneous hematoma, without intraorbital extension and without facial fracture. 2. No acute intracranial findings. No acute cervical spine findings. 3. Periventricular white matter and corona radiata hypodensities favor chronic ischemic microvascular white matter disease. 4. Mild right common carotid artery atherosclerotic calcification. 5. Several mandibular dental cavities. Electronically Signed   By: Van Clines M.D.   On: 08/07/2018 15:55   Ct Cervical Spine Wo Contrast  Result Date: 08/07/2018 CLINICAL DATA:  Fall at home.  Hyperglycemia.  Left facial bruising. EXAM: CT  HEAD WITHOUT CONTRAST CT MAXILLOFACIAL WITHOUT CONTRAST CT CERVICAL SPINE WITHOUT CONTRAST TECHNIQUE: Multidetector CT imaging of the head, cervical spine, and maxillofacial structures were performed using the standard protocol without intravenous contrast. Multiplanar CT image reconstructions of the cervical spine and maxillofacial structures were also generated. COMPARISON:  Multiple exams, including 10/02/2017 FINDINGS: CT HEAD FINDINGS Brain: The brainstem, cerebellum, cerebral peduncles, thalami, basal ganglia, basilar cisterns, and ventricular system appear within normal limits. Periventricular white matter and corona radiata hypodensities favor chronic ischemic microvascular white matter disease. The previous low-density left frontal extra-axial fluid prominence is no longer present. Vascular: Unremarkable Skull: Unremarkable Other: Left lateral periorbital soft tissue swelling. CT MAXILLOFACIAL FINDINGS Osseous: No facial fractures identified. The patient has several likely mandibular dental cavities. Orbits: No intraorbital/postseptal abnormality. The globes appear intact. Sinuses: Unremarkable Soft tissues: Left periorbital soft tissue swelling/subcutaneous hematoma, especially lateral. There is potentially some soft tissue swelling along the left chin. CT CERVICAL SPINE FINDINGS Alignment: No vertebral subluxation is observed. Skull base and vertebrae: No cervical spine fracture identified. No acute bony findings. Soft tissues and spinal canal: Right common carotid atherosclerotic calcification. Disc levels:  No bony impingement identified. Upper chest: Unremarkable Other: No supplemental non-categorized findings. IMPRESSION: 1. Left periorbital soft tissue swelling/subcutaneous hematoma, without  intraorbital extension and without facial fracture. 2. No acute intracranial findings. No acute cervical spine findings. 3. Periventricular white matter and corona radiata hypodensities favor chronic ischemic  microvascular white matter disease. 4. Mild right common carotid artery atherosclerotic calcification. 5. Several mandibular dental cavities. Electronically Signed   By: Van Clines M.D.   On: 08/07/2018 15:55   Ct Pelvis Wo Contrast  Result Date: 08/07/2018 CLINICAL DATA:  Fall.  Deformity of the left hip. EXAM: CT PELVIS WITHOUT CONTRAST TECHNIQUE: Multidetector CT imaging of the pelvis was performed following the standard protocol without intravenous contrast. COMPARISON:  None. FINDINGS: Urinary Tract:  Unremarkable Bowel:  Unremarkable Vascular/Lymphatic: Unremarkable Reproductive:  Unremarkable Other:  No supplemental non-categorized findings. Musculoskeletal: Comminuted intertrochanteric fracture of the left hip. Multiple lesser trochanter fragments and several greater trochanter fragments. Mild varus angulation. Remote L4 compression fracture. The patient's right hand was included in imaging incidentally. There is evidence of plate and screw fixator along the proximal phalanx of the thumb, what appears to be some callus formation at likely chronic deformity of the index finger metacarpal, correlate with patient history. IMPRESSION: 1. Acute comminuted intratrochanteric fracture of the left proximal femur. Mild Verus angulation. 2. Remote L4 compression fracture. 3. Old deformities in the right hand which was incidentally included in the imaging volume. Electronically Signed   By: Van Clines M.D.   On: 08/07/2018 16:12   Dg Shoulder Left  Result Date: 08/07/2018 CLINICAL DATA:  Fall. EXAM: LEFT SHOULDER - 2+ VIEW COMPARISON:  Radiographs of Oct 02, 2017. FINDINGS: Old displaced proximal left humeral head and neck fracture is noted. Heterotopic bone formation and callus is noted consistent with old fracture. Glenohumeral joint is not well visualized due to overlying fracture. Acromioclavicular joint appears normal. Old left rib fractures are noted. IMPRESSION: Old proximal left humeral  fracture is noted. No definite acute abnormality is noted. Electronically Signed   By: Marijo Conception, M.D.   On: 08/07/2018 15:50   Dg Hip Operative Unilat W Or W/o Pelvis Left  Result Date: 08/08/2018 CLINICAL DATA:  Left hip intramedullary nail. EXAM: OPERATIVE left HIP (WITH PELVIS IF PERFORMED) 4 VIEWS TECHNIQUE: Fluoroscopic spot image(s) were submitted for interpretation post-operatively. COMPARISON:  CT 08/07/2018 FINDINGS: Exam demonstrates placement of a left femoral intramedullary nail with associated screw bridging patient's intertrochanteric fracture into the femoral head as hardware is intact. There is anatomic alignment over the fracture site. IMPRESSION: Left intramedullary nail with associated screw bridging patient's intertrochanteric fracture with hardware intact and anatomic alignment over the fracture site. Electronically Signed   By: Marin Olp M.D.   On: 08/08/2018 19:26   Ct Maxillofacial Wo Contrast  Result Date: 08/07/2018 CLINICAL DATA:  Fall at home.  Hyperglycemia.  Left facial bruising. EXAM: CT HEAD WITHOUT CONTRAST CT MAXILLOFACIAL WITHOUT CONTRAST CT CERVICAL SPINE WITHOUT CONTRAST TECHNIQUE: Multidetector CT imaging of the head, cervical spine, and maxillofacial structures were performed using the standard protocol without intravenous contrast. Multiplanar CT image reconstructions of the cervical spine and maxillofacial structures were also generated. COMPARISON:  Multiple exams, including 10/02/2017 FINDINGS: CT HEAD FINDINGS Brain: The brainstem, cerebellum, cerebral peduncles, thalami, basal ganglia, basilar cisterns, and ventricular system appear within normal limits. Periventricular white matter and corona radiata hypodensities favor chronic ischemic microvascular white matter disease. The previous low-density left frontal extra-axial fluid prominence is no longer present. Vascular: Unremarkable Skull: Unremarkable Other: Left lateral periorbital soft tissue  swelling. CT MAXILLOFACIAL FINDINGS Osseous: No facial fractures identified. The patient has several likely  mandibular dental cavities. Orbits: No intraorbital/postseptal abnormality. The globes appear intact. Sinuses: Unremarkable Soft tissues: Left periorbital soft tissue swelling/subcutaneous hematoma, especially lateral. There is potentially some soft tissue swelling along the left chin. CT CERVICAL SPINE FINDINGS Alignment: No vertebral subluxation is observed. Skull base and vertebrae: No cervical spine fracture identified. No acute bony findings. Soft tissues and spinal canal: Right common carotid atherosclerotic calcification. Disc levels:  No bony impingement identified. Upper chest: Unremarkable Other: No supplemental non-categorized findings. IMPRESSION: 1. Left periorbital soft tissue swelling/subcutaneous hematoma, without intraorbital extension and without facial fracture. 2. No acute intracranial findings. No acute cervical spine findings. 3. Periventricular white matter and corona radiata hypodensities favor chronic ischemic microvascular white matter disease. 4. Mild right common carotid artery atherosclerotic calcification. 5. Several mandibular dental cavities. Electronically Signed   By: Van Clines M.D.   On: 08/07/2018 15:55    (Echo, Carotid, EGD, Colonoscopy, ERCP)    Subjective: Patient is resting in bed board and anxious to go home eyes any chest pain shortness of breath or cough  Discharge Exam: Vitals:   08/14/18 2110 08/15/18 0527  BP: (!) 144/76 (!) 144/75  Pulse: 87 87  Resp: 18 18  Temp: 98.6 F (37 C) 98.3 F (36.8 C)  SpO2: 97% 99%   Vitals:   08/14/18 0605 08/14/18 1330 08/14/18 2110 08/15/18 0527  BP: (!) 144/82 124/61 (!) 144/76 (!) 144/75  Pulse: 97 (!) 102 87 87  Resp: 18 18 18 18   Temp: 97.7 F (36.5 C) 98.3 F (36.8 C) 98.6 F (37 C) 98.3 F (36.8 C)  TempSrc: Oral Oral Oral Oral  SpO2: 100% 100% 97% 99%  Weight:      Height:         General: Pt is alert, awake, not in acute distress Cardiovascular: RRR, S1/S2 +, no rubs, no gallops Respiratory: CTA bilaterally, no wheezing, no rhonchi Abdominal: Soft, NT, ND, bowel sounds + Extremities left hip staples in place   The results of significant diagnostics from this hospitalization (including imaging, microbiology, ancillary and laboratory) are listed below for reference.     Microbiology: Recent Results (from the past 240 hour(s))  Blood Culture (routine x 2)     Status: None   Collection Time: 08/07/18  3:07 PM  Result Value Ref Range Status   Specimen Description BLOOD LEFT ARM  Final   Special Requests   Final    BOTTLES DRAWN AEROBIC AND ANAEROBIC Blood Culture results may not be optimal due to an excessive volume of blood received in culture bottles   Culture   Final    NO GROWTH 5 DAYS Performed at [facility], [address], [place], [place] [address]    Report Status 08/12/2018 FINAL  Final  Blood Culture (routine x 2)     Status: Abnormal   Collection Time: 08/07/18  3:07 PM  Result Value Ref Range Status   Specimen Description   Final    BLOOD RIGHT ARM Performed at [facility], [address] [place], [place] [address]    Special Requests   Final    BOTTLES DRAWN AEROBIC AND ANAEROBIC Blood Culture results may not be optimal due to an excessive volume of blood received in culture bottles Performed at [facility], [address] [place], [place] [address]    Culture  Setup Time   Final    GRAM POSITIVE COCCI AEROBIC BOTTLE ONLY CRITICAL RESULT CALLED TO, READ BACK BY AND VERIFIED WITH: KAREN HAYES  AT 1108 08/08/18 SDR    Culture (A)  Final    STAPHYLOCOCCUS SPECIES (COAGULASE NEGATIVE) THE SIGNIFICANCE OF ISOLATING THIS ORGANISM FROM A SINGLE SET OF BLOOD CULTURES WHEN MULTIPLE SETS ARE DRAWN IS UNCERTAIN. PLEASE NOTIFY THE MICROBIOLOGY DEPARTMENT WITHIN ONE WEEK IF SPECIATION AND SENSITIVITIES ARE  REQUIRED. Performed at [facility], [place] [address] [place], [place] [address]    Report Status 08/10/2018 FINAL  Final  Urine culture     Status: Abnormal   Collection Time: 08/07/18  3:07 PM  Result Value Ref Range Status   Specimen Description   Final    URINE, RANDOM Performed at [facility], [address] [place], [place] [address]    Special Requests   Final    NONE Performed at [facility], [place], [place] [address]    Culture 80,000 COLONIES/mL KLEBSIELLA PNEUMONIAE (A)  Final   Report Status 08/09/2018 FINAL  Final   Organism ID, Bacteria KLEBSIELLA PNEUMONIAE (A)  Final      Susceptibility   Klebsiella pneumoniae - MIC*    AMPICILLIN >=32 RESISTANT Resistant     CEFAZOLIN <=4 SENSITIVE Sensitive     CEFTRIAXONE <=1 SENSITIVE Sensitive     CIPROFLOXACIN <=0.25 SENSITIVE Sensitive     GENTAMICIN <=1 SENSITIVE Sensitive     IMIPENEM <=0.25 SENSITIVE Sensitive     NITROFURANTOIN 32 SENSITIVE Sensitive     TRIMETH/SULFA <=20 SENSITIVE Sensitive     AMPICILLIN/SULBACTAM 8 SENSITIVE Sensitive     PIP/TAZO <=4 SENSITIVE Sensitive     Extended ESBL NEGATIVE Sensitive     * 80,000 COLONIES/mL KLEBSIELLA PNEUMONIAE  Blood Culture ID Panel (Reflexed)     Status: Abnormal   Collection Time: 08/07/18  3:07 PM  Result Value Ref Range Status   Enterococcus species NOT DETECTED NOT DETECTED Final   Listeria monocytogenes NOT DETECTED NOT DETECTED Final   Staphylococcus species DETECTED (A) NOT DETECTED Final    Comment: Methicillin (oxacillin) resistant coagulase negative staphylococcus. Possible blood culture contaminant (unless isolated from more than one blood culture draw or clinical case suggests pathogenicity). No antibiotic treatment is indicated for blood  culture contaminants. CRITICAL RESULT CALLED TO, READ BACK BY AND VERIFIED WITH:  KAREN HAYES AT 1108 08/08/18 SDR    Staphylococcus aureus (BCID) NOT DETECTED NOT DETECTED  Final   Methicillin resistance DETECTED (A) NOT DETECTED Final    Comment: CRITICAL RESULT CALLED TO, READ BACK BY AND VERIFIED WITH:  KAREN HAYES AT 1108 08/08/18 SDR    Streptococcus species NOT DETECTED NOT DETECTED Final   Streptococcus agalactiae NOT DETECTED NOT DETECTED Final   Streptococcus pneumoniae NOT DETECTED NOT DETECTED Final   Streptococcus pyogenes NOT DETECTED NOT DETECTED Final   Acinetobacter baumannii NOT DETECTED NOT DETECTED Final   Enterobacteriaceae species NOT DETECTED NOT DETECTED Final   Enterobacter cloacae complex NOT DETECTED NOT DETECTED Final   Escherichia coli NOT DETECTED NOT DETECTED Final   Klebsiella oxytoca NOT DETECTED NOT DETECTED Final   Klebsiella pneumoniae NOT DETECTED NOT DETECTED Final   Proteus species NOT DETECTED NOT DETECTED Final   Serratia marcescens NOT DETECTED NOT DETECTED Final   Haemophilus influenzae NOT DETECTED NOT DETECTED Final   Neisseria meningitidis NOT DETECTED NOT DETECTED Final   Pseudomonas aeruginosa NOT DETECTED NOT DETECTED Final   Candida albicans NOT DETECTED NOT DETECTED Final   Candida glabrata NOT DETECTED NOT DETECTED Final   Candida krusei NOT DETECTED NOT DETECTED Final   Candida  parapsilosis NOT DETECTED NOT DETECTED Final   Candida tropicalis NOT DETECTED NOT DETECTED Final    Comment: Performed at [facility], [address] [place], [place] [address]  Novel Coronavirus, NAA (hospital order; send-out to ref lab)     Status: Abnormal   Collection Time: 08/07/18  7:05 PM  Result Value Ref Range Status   SARS-CoV-2, NAA DETECTED (A) NOT DETECTED Final    Comment: Positive (Detected) results are indicative of active infection with SARS CoV 2. A positive result does not rule out bacterial infection or coinfection with other viruses. Detection of SARS CoV 2 viral RNA may not indicate that SARS CoV 2 is the causative agent for clinical symptoms. Positive and negative predictive values of testing are  highly dependent on prevalence. False positive test results are more likely when prevalence is moderate to low. CRITICAL RESULT CALLED TO, READ BACK BY AND VERIFIED WITH: Mariann Laster RN [phone] 08/09/18 A BROWNING (NOTE) The expected result is Negative (Not Detected). The SARS CoV 2 test is intended for the presumptive qualitative  detection of nucleic acid from SARS CoV 2 in upper and lower  respiratory specimens. Testing methodology is real time RT PCR. Test results must be correlated with clinical presentation and  evaluated in the context of other laboratory and epidemiologic data.  Test performance can be affected because the epidemiology and   clinical spectrum of infection caused by SARS CoV 2 is not fully  known. For example, the optimum types of specimens to collect and  when during the course of infection these specimens are most likely  to contain detectable viral RNA may not be known. This test has not been Food and Drug Administration (FDA) cleared or  approved and has been authorized by FDA under an Emergency Use  Authorization (EUA). The test is only authorized for the duration of  the declaration that circumstances exist justifying the authorization  of emergency use of in vitro diagnostic tests for detection and or  diagnosis of SARS CoV 2 under Section 564(b)(1) of the Act, 21 U.S.C.  section [phone] 3(b)(1), unless the authorization is terminated or  revoked sooner. [facility] is certified under the  Clinical Laboratory Improvement Amendments of 1988 (CLIA), 42 U.S.C.  section [phone], to perform high complexity tests. Performed at [place] [id number], [place], [place] [address] Laboratory Director: Loleta Books, MD Performed at [facility], [place] [address], [place], [place] [address]    Coronavirus Source NASOPHARYNGEAL  Final    Comment: Performed at [facility], [address] [place], [place] [address]  Respiratory Panel by PCR     Status: None   Collection Time: 08/07/18  7:05 PM  Result Value Ref Range Status   Adenovirus NOT DETECTED NOT DETECTED Final   Coronavirus 229E NOT DETECTED NOT DETECTED Final    Comment: (NOTE) The Coronavirus on the Respiratory Panel, DOES NOT test for the novel  Coronavirus (2019 nCoV)    Coronavirus HKU1 NOT DETECTED NOT DETECTED Final   Coronavirus NL63 NOT DETECTED NOT DETECTED Final   Coronavirus OC43 NOT DETECTED NOT DETECTED Final   Metapneumovirus NOT DETECTED NOT DETECTED Final   Rhinovirus / Enterovirus NOT DETECTED NOT DETECTED Final   Influenza A NOT DETECTED NOT DETECTED Final   Influenza B NOT DETECTED NOT DETECTED Final   Parainfluenza Virus 1 NOT DETECTED NOT DETECTED Final   Parainfluenza Virus 2  NOT DETECTED NOT DETECTED Final   Parainfluenza Virus 3 NOT DETECTED NOT DETECTED Final   Parainfluenza Virus 4 NOT DETECTED NOT DETECTED Final   Respiratory Syncytial Virus NOT DETECTED NOT DETECTED Final   Bordetella pertussis NOT DETECTED NOT DETECTED Final   Chlamydophila pneumoniae NOT DETECTED NOT DETECTED Final   Mycoplasma pneumoniae NOT DETECTED NOT DETECTED Final    Comment: Performed at [facility], [place] [address] [place], [place] [address]  MRSA PCR Screening     Status: Abnormal   Collection Time: 08/07/18  7:05 PM  Result Value Ref Range Status   MRSA by PCR POSITIVE (A) NEGATIVE Final    Comment:        The GeneXpert MRSA Assay (FDA approved for NASAL specimens only), is one component of a comprehensive MRSA colonization surveillance program. It is not intended to diagnose MRSA infection nor to guide or monitor treatment for MRSA infections. CRITICAL RESULT CALLED TO, READ BACK BY AND VERIFIED WITH: CALLED TO FELICIA PREUDHOMME @2052  08/07/2018 [facility] Performed at [facility], [address] [place], [place] [address]      Labs: BNP (last 3 results) No  results for input(s): BNP in the last 8760 hours. Basic Metabolic Panel: Recent Labs  Lab 08/09/18 0519 08/10/18 0548 08/11/18 0345 08/12/18 0333 08/13/18 0817 08/15/18 0407  NA 132* 132* 131* 131*  --  133*  K 3.9 4.2 5.3* 5.2*  --  4.1  CL 99 100 98 98  --  97*  CO2 24 26 26 26   --  30  GLUCOSE 87 131* 161* 353* 103* 144*  BUN 17 12 14 17   --  12  CREATININE 0.94 0.91 0.95 1.01  --  0.73  CALCIUM 7.9* 8.0* 8.1* 8.0*  --  8.4*   Liver Function Tests: Recent Labs  Lab 08/09/18 0519  AST 44*  ALT 6  ALKPHOS 82  BILITOT 0.5  PROT 5.7*  ALBUMIN 2.6*   No results for input(s): LIPASE, AMYLASE in the last 168 hours. No results for input(s): AMMONIA in the last 168 hours. CBC: Recent Labs  Lab 08/09/18 0519 08/11/18 0345 08/12/18 0333 08/15/18 0407  WBC 10.9* 5.7 5.9 7.5  NEUTROABS 8.6* 4.1  --   --   HGB 8.2* 7.9* 7.9* 7.8*  HCT 24.9* 25.3* 25.3* 25.3*  MCV 95.4 98.8 98.8 98.8  PLT 283 340 335 518*   Cardiac Enzymes: No results for input(s): CKTOTAL, CKMB, CKMBINDEX, TROPONINI in the last 168 hours. BNP: Invalid input(s): POCBNP CBG: Recent Labs  Lab 08/14/18 0816 08/14/18 1152 08/14/18 1642 08/14/18 2105 08/15/18 0804  GLUCAP 171* 242* 309* 340* 118*   D-Dimer No results for input(s): DDIMER in the last 72 hours. Hgb A1c No results for input(s): HGBA1C in the last 72 hours. Lipid Profile No results for input(s): CHOL, HDL, LDLCALC, TRIG, CHOLHDL, LDLDIRECT in the last 72 hours. Thyroid function studies No results for input(s): TSH, T4TOTAL, T3FREE, THYROIDAB in the last 72 hours.  Invalid input(s): FREET3 Anemia work up No results for input(s): VITAMINB12, FOLATE, FERRITIN, TIBC, IRON, RETICCTPCT in the last 72 hours. Urinalysis    Component Value Date/Time   COLORURINE YELLOW (A) 08/07/2018 1507   APPEARANCEUR HAZY (A) 08/07/2018 1507   LABSPEC 1.017 08/07/2018 1507   PHURINE 5.0 08/07/2018 1507   GLUCOSEU >=500 (A) 08/07/2018 1507   HGBUR  LARGE (A) 08/07/2018 1507   BILIRUBINUR NEGATIVE 08/07/2018 1507   KETONESUR 80 (A) 08/07/2018 1507   PROTEINUR 100 (  A) 08/07/2018 1507   NITRITE NEGATIVE 08/07/2018 1507   LEUKOCYTESUR NEGATIVE 08/07/2018 1507   Sepsis Labs Invalid input(s): PROCALCITONIN,  WBC,  LACTICIDVEN Microbiology Recent Results (from the past 240 hour(s))  Blood Culture (routine x 2)     Status: None   Collection Time: 08/07/18  3:07 PM  Result Value Ref Range Status   Specimen Description BLOOD LEFT ARM  Final   Special Requests   Final    BOTTLES DRAWN AEROBIC AND ANAEROBIC Blood Culture results may not be optimal due to an excessive volume of blood received in culture bottles   Culture   Final    NO GROWTH 5 DAYS Performed at [facility], [address] [place], [place] [address]    Report Status 08/12/2018 FINAL  Final  Blood Culture (routine x 2)     Status: Abnormal   Collection Time: 08/07/18  3:07 PM  Result Value Ref Range Status   Specimen Description   Final    BLOOD RIGHT ARM Performed at [facility], [address], [place], [place] [address]    Special Requests   Final    BOTTLES DRAWN AEROBIC AND ANAEROBIC Blood Culture results may not be optimal due to an excessive volume of blood received in culture bottles Performed at [facility], [address], [place], [place] [address]    Culture  Setup Time   Final    [place] CRITICAL RESULT CALLED TO, READ BACK BY AND VERIFIED WITH: KAREN HAYES AT 1108 08/08/18 SDR    Culture (A)  Final    STAPHYLOCOCCUS SPECIES (COAGULASE NEGATIVE) THE SIGNIFICANCE OF ISOLATING THIS ORGANISM FROM A SINGLE SET OF BLOOD CULTURES WHEN MULTIPLE SETS ARE DRAWN IS UNCERTAIN. PLEASE NOTIFY THE MICROBIOLOGY DEPARTMENT WITHIN ONE WEEK IF SPECIATION AND SENSITIVITIES ARE REQUIRED. Performed at [facility], [place] [address], [place], [place] [address]    Report Status 08/10/2018 FINAL  Final   Urine culture     Status: Abnormal   Collection Time: 08/07/18  3:07 PM  Result Value Ref Range Status   Specimen Description   Final    URINE, RANDOM Performed at [facility], [address] [place], [place] [address]    Special Requests   Final    NONE Performed at [facility], [place], [place] [address]    Culture 80,000 COLONIES/mL KLEBSIELLA PNEUMONIAE (A)  Final   Report Status 08/09/2018 FINAL  Final   Organism ID, Bacteria KLEBSIELLA PNEUMONIAE (A)  Final      Susceptibility   Klebsiella pneumoniae - MIC*    AMPICILLIN >=32 RESISTANT Resistant     CEFAZOLIN <=4 SENSITIVE Sensitive     CEFTRIAXONE <=1 SENSITIVE Sensitive     CIPROFLOXACIN <=0.25 SENSITIVE Sensitive     GENTAMICIN <=1 SENSITIVE Sensitive     IMIPENEM <=0.25 SENSITIVE Sensitive     NITROFURANTOIN 32 SENSITIVE Sensitive     TRIMETH/SULFA <=20 SENSITIVE Sensitive     AMPICILLIN/SULBACTAM 8 SENSITIVE Sensitive     PIP/TAZO <=4 SENSITIVE Sensitive     Extended ESBL NEGATIVE Sensitive     * 80,000 COLONIES/mL KLEBSIELLA PNEUMONIAE  Blood Culture ID Panel (Reflexed)     Status: Abnormal   Collection Time: 08/07/18  3:07 PM  Result Value Ref Range Status   Enterococcus species NOT DETECTED NOT DETECTED Final   Listeria monocytogenes NOT DETECTED NOT DETECTED Final   Staphylococcus species DETECTED (A) NOT DETECTED Final  Comment: Methicillin (oxacillin) resistant coagulase negative staphylococcus. Possible blood culture contaminant (unless isolated from more than one blood culture draw or clinical case suggests pathogenicity). No antibiotic treatment is indicated for blood  culture contaminants. CRITICAL RESULT CALLED TO, READ BACK BY AND VERIFIED WITH:  KAREN HAYES AT 1108 08/08/18 SDR    Staphylococcus aureus (BCID) NOT DETECTED NOT DETECTED Final   Methicillin resistance DETECTED (A) NOT DETECTED Final    Comment: CRITICAL RESULT CALLED TO, READ BACK BY AND VERIFIED  WITH:  KAREN HAYES AT 1108 08/08/18 SDR    Streptococcus species NOT DETECTED NOT DETECTED Final   Streptococcus agalactiae NOT DETECTED NOT DETECTED Final   Streptococcus pneumoniae NOT DETECTED NOT DETECTED Final   Streptococcus pyogenes NOT DETECTED NOT DETECTED Final   Acinetobacter baumannii NOT DETECTED NOT DETECTED Final   Enterobacteriaceae species NOT DETECTED NOT DETECTED Final   Enterobacter cloacae complex NOT DETECTED NOT DETECTED Final   Escherichia coli NOT DETECTED NOT DETECTED Final   Klebsiella oxytoca NOT DETECTED NOT DETECTED Final   Klebsiella pneumoniae NOT DETECTED NOT DETECTED Final   Proteus species NOT DETECTED NOT DETECTED Final   Serratia marcescens NOT DETECTED NOT DETECTED Final   Haemophilus influenzae NOT DETECTED NOT DETECTED Final   Neisseria meningitidis NOT DETECTED NOT DETECTED Final   Pseudomonas aeruginosa NOT DETECTED NOT DETECTED Final   Candida albicans NOT DETECTED NOT DETECTED Final   Candida glabrata NOT DETECTED NOT DETECTED Final   Candida krusei NOT DETECTED NOT DETECTED Final   Candida parapsilosis NOT DETECTED NOT DETECTED Final   Candida tropicalis NOT DETECTED NOT DETECTED Final    Comment: Performed at [facility], [address] [place], [place] [address]  Novel Coronavirus, NAA (hospital order; send-out to ref lab)     Status: Abnormal   Collection Time: 08/07/18  7:05 PM  Result Value Ref Range Status   SARS-CoV-2, NAA DETECTED (A) NOT DETECTED Final    Comment: Positive (Detected) results are indicative of active infection with SARS CoV 2. A positive result does not rule out bacterial infection or coinfection with other viruses. Detection of SARS CoV 2 viral RNA may not indicate that SARS CoV 2 is the causative agent for clinical symptoms. Positive and negative predictive values of testing are highly dependent on prevalence. False positive test results are more likely when prevalence is moderate to low. CRITICAL RESULT  CALLED TO, READ BACK BY AND VERIFIED WITH: Mariann Laster RN 0093 08/09/18 A [facility] (NOTE) The expected result is Negative (Not Detected). The SARS CoV 2 test is intended for the presumptive qualitative  detection of nucleic acid from SARS CoV 2 in upper and lower  respiratory specimens. Testing methodology is real time RT PCR. Test results must be correlated with clinical presentation and  evaluated in the context of other laboratory and epidemiologic data.  Test performance can be affected because the epidemiology and   clinical spectrum of infection caused by SARS CoV 2 is not fully  known. For example, the optimum types of specimens to collect and  when during the course of infection these specimens are most likely  to contain detectable viral RNA may not be known. This test has not been Food and Drug Administration (FDA) cleared or  approved and has been authorized by FDA under an Emergency Use  Authorization (EUA). The test is only authorized for the duration of  the declaration that circumstances exist justifying the authorization  of emergency use of in vitro diagnostic tests for  detection and or  diagnosis of SARS CoV 2 under Section 564(b)(1) of the Act, 21 U.S.C.  section [phone] 3(b)(1), unless the authorization is terminated or  revoked sooner. [facility] is certified under the  Clinical Laboratory Improvement Amendments of 1988 (CLIA), 42 U.S.C.  section [phone], to perform high complexity tests. Performed at [place] [id number], [place], [place] [address] Laboratory Director: Loleta Books, MD Performed at [facility], [place] [address] [place], [place] [address]    Coronavirus Source NASOPHARYNGEAL  Final    Comment: Performed at [facility], [address] [place], [place] [address]  Respiratory Panel by PCR     Status: None   Collection Time: 08/07/18  7:05 PM  Result Value  Ref Range Status   Adenovirus NOT DETECTED NOT DETECTED Final   Coronavirus 229E NOT DETECTED NOT DETECTED Final    Comment: (NOTE) The Coronavirus on the Respiratory Panel, DOES NOT test for the novel  Coronavirus (2019 nCoV)    Coronavirus HKU1 NOT DETECTED NOT DETECTED Final   Coronavirus NL63 NOT DETECTED NOT DETECTED Final   Coronavirus OC43 NOT DETECTED NOT DETECTED Final   Metapneumovirus NOT DETECTED NOT DETECTED Final   Rhinovirus / Enterovirus NOT DETECTED NOT DETECTED Final   Influenza A NOT DETECTED NOT DETECTED Final   Influenza B NOT DETECTED NOT DETECTED Final   Parainfluenza Virus 1 NOT DETECTED NOT DETECTED Final   Parainfluenza Virus 2 NOT DETECTED NOT DETECTED Final   Parainfluenza Virus 3 NOT DETECTED NOT DETECTED Final   Parainfluenza Virus 4 NOT DETECTED NOT DETECTED Final   Respiratory Syncytial Virus NOT DETECTED NOT DETECTED Final   Bordetella pertussis NOT DETECTED NOT DETECTED Final   Chlamydophila pneumoniae NOT DETECTED NOT DETECTED Final   Mycoplasma pneumoniae NOT DETECTED NOT DETECTED Final    Comment: Performed at [facility], [address] [place], [place] [address]  MRSA PCR Screening     Status: Abnormal   Collection Time: 08/07/18  7:05 PM  Result Value Ref Range Status   MRSA by PCR POSITIVE (A) NEGATIVE Final    Comment:        The GeneXpert MRSA Assay (FDA approved for NASAL specimens only), is one component of a comprehensive MRSA colonization surveillance program. It is not intended to diagnose MRSA infection nor to guide or monitor treatment for MRSA infections. CRITICAL RESULT CALLED TO, READ BACK BY AND VERIFIED WITH: CALLED TO [place] @2052  08/07/2018 [facility] Performed at [facility], [address], [place], [place] [address]      Time coordinating discharge: 33 minutes  SIGNED:   Georgette Shell, MD  [facility] 08/15/2018, 9:28 AM Pager   If 7PM-7AM, please contact  night-coverage [email or web address] Password [email or web address]

## 2018-08-15 NOTE — Discharge Instructions (Addendum)
Coronavirus (COVID-19) Are you at risk?  Are you at risk for the Coronavirus (COVID-19)?  To be considered HIGH RISK for Coronavirus (COVID-19), you have to meet the following criteria:   Traveled to [place], [place], [place], [place] or [place]; or in the [place] to [place], [place], [place], or [place]; and have fever, cough, and shortness of breath within the last 2 weeks of travel OR  Been in close contact with a person diagnosed with COVID-19 within the last 2 weeks and have fever, cough, and shortness of breath  IF YOU DO NOT MEET THESE CRITERIA, YOU ARE CONSIDERED LOW RISK FOR COVID-19.  What to do if you are HIGH RISK for COVID-19?     Person Under Monitoring Name: John Reilly  Location: [place] [address]   CORONAVIRUS DISEASE 2019 (COVID-19) Guidance for Persons Under Investigation You are being tested for the virus that causes coronavirus disease 2019 (COVID-19). Public health actions are necessary to ensure protection of your health and the health of others, and to prevent further spread of infection. COVID-19 is caused by a virus that can cause symptoms, such as fever, cough, and shortness of breath. The primary transmission from person to person is by coughing or sneezing. On June 09, 2018, the [facility] announced a [employer] Emergency of International Concern and on June 10, 2018 the U.S. Department of Health and Human Services declared a public health emergency. If the virus that causesCOVID-19 spreads in the community, it could have severe public health consequences.  As a person under investigation for COVID-19, the [facility] advises you to adhere to the following guidance until your test results are reported to you. If your test result is positive, you will receive additional information from your provider and your local health department at  that time.   Remain at home until you are cleared by your health provider or public health authorities.   Keep a log of visitors to your home using the form provided. Any visitors to your home must be aware of your isolation status.  If you plan to move to a new address or leave the county, notify the local health department in your county.  Call a doctor or seek care if you have an urgent medical need. Before seeking medical care, call ahead and get instructions from the provider before arriving at the medical office, clinic or hospital. Notify them that you are being tested for the virus that causes COVID-19 so arrangements can be made, as necessary, to prevent transmission to others in the healthcare setting. Next, notify the local health department in your county.  If a medical emergency arises and you need to call 911, inform the first responders that you are being tested for the virus that causes COVID-19. Next, notify the local health department in your county.  Adhere to all guidance set forth by the [facility] for [facility] of patients that is based on guidance from the Center for Disease Control and Prevention with suspected or confirmed COVID-19. It is provided with this guidance for Persons Under Investigation.  Your health and the health of our community are our top priorities. Public Health officials remain available to provide assistance and counseling to you about COVID-19 and compliance with this guidance.  Provider: ____________________________________________________________ Date: ______/_____/_________  By signing below, you acknowledge that you have read and agree  to comply with this Guidance for Persons Under Investigation. ______________________________________________________________ Date: ______/_____/_________  WHO DO I CALL? You can find a list of local health departments here:  [email or web address] Health Department: ____________________________________________________________________ Contact Name: ________________________________________________________________________ Telephone: ___________________________________________________________________________  Marice Potter, [place], Communicable Disease Branch COVID-19 Guidance for Persons Under Investigation July 16, 2018     Person Under Monitoring Name: John Reilly  Location: [place] [address]   Infection Prevention Recommendations for Individuals Confirmed to have, or Being Evaluated for, 2019 Novel Coronavirus (COVID-19) Infection Who Receive Care at Home  Individuals who are confirmed to have, or are being evaluated for, COVID-19 should follow the prevention steps below until a healthcare provider or local or state health department says they can return to normal activities.  Stay home except to get medical care You should restrict activities outside your home, except for getting medical care. Do not go to work, school, or public areas, and do not use public transportation or taxis.  Call ahead before visiting your doctor Before your medical appointment, call the healthcare provider and tell them that you have, or are being evaluated for, COVID-19 infection. This will help the healthcare providers office take steps to keep other people from getting infected. Ask your healthcare provider to call the local or state health department.  Monitor your symptoms Seek prompt medical attention if your illness is worsening (e.g., difficulty breathing). Before going to your medical appointment, call the healthcare provider and tell them that you have, or are being evaluated for, COVID-19 infection. Ask your healthcare provider to call the local or state health department.  Wear a facemask You should wear a facemask  that covers your nose and mouth when you are in the same room with other people and when you visit a healthcare provider. People who live with or visit you should also wear a facemask while they are in the same room with you.  Separate yourself from other people in your home As much as possible, you should stay in a different room from other people in your home. Also, you should use a separate bathroom, if available.  Avoid sharing household items You should not share dishes, drinking glasses, cups, eating utensils, towels, bedding, or other items with other people in your home. After using these items, you should wash them thoroughly with soap and water.  Cover your coughs and sneezes Cover your mouth and nose with a tissue when you cough or sneeze, or you can cough or sneeze into your sleeve. Throw used tissues in a lined trash can, and immediately wash your hands with soap and water for at least 20 seconds or use an alcohol-based hand rub.  Wash your [facility] your hands often and thoroughly with soap and water for at least 20 seconds. You can use an alcohol-based hand sanitizer if soap and water are not available and if your hands are not visibly dirty. Avoid touching your eyes, nose, and mouth with unwashed hands.   Prevention Steps for Caregivers and Household Members of Individuals Confirmed to have, or Being Evaluated for, COVID-19 Infection Being Cared for in the Home  If you live with, or provide care at home for, a person confirmed to have, or being evaluated for, COVID-19 infection please follow these guidelines to prevent infection:  Follow healthcare providers instructions Make sure that you understand and can help the patient follow any healthcare provider instructions for all care.  Provide for the patients basic  needs You should help the patient with basic needs in the home and provide support for getting groceries, prescriptions, and other personal  needs.  Monitor the patients symptoms If they are getting sicker, call his or her medical provider and tell them that the patient has, or is being evaluated for, COVID-19 infection. This will help the healthcare providers office take steps to keep other people from getting infected. Ask the healthcare provider to call the local or state health department.  Limit the number of people who have contact with the patient  If possible, have only one caregiver for the patient.  Other household members should stay in another home or place of residence. If this is not possible, they should stay  in another room, or be separated from the patient as much as possible. Use a separate bathroom, if available.  Restrict visitors who do not have an essential need to be in the home.  Keep older adults, very young children, and other sick people away from the patient Keep older adults, very young children, and those who have compromised immune systems or chronic health conditions away from the patient. This includes people with chronic heart, lung, or kidney conditions, diabetes, and cancer.  Ensure good ventilation Make sure that shared spaces in the home have good air flow, such as from an air conditioner or an opened window, weather permitting.  Wash your hands often  Wash your hands often and thoroughly with soap and water for at least 20 seconds. You can use an alcohol based hand sanitizer if soap and water are not available and if your hands are not visibly dirty.  Avoid touching your eyes, nose, and mouth with unwashed hands.  Use disposable paper towels to dry your hands. If not available, use dedicated cloth towels and replace them when they become wet.  Wear a facemask and gloves  Wear a disposable facemask at all times in the room and gloves when you touch or have contact with the patients blood, body fluids, and/or secretions or excretions, such as sweat, saliva, sputum, nasal mucus,  vomit, urine, or feces.  Ensure the mask fits over your nose and mouth tightly, and do not touch it during use.  Throw out disposable facemasks and gloves after using them. Do not reuse.  Wash your hands immediately after removing your facemask and gloves.  If your personal clothing becomes contaminated, carefully remove clothing and launder. Wash your hands after handling contaminated clothing.  Place all used disposable facemasks, gloves, and other waste in a lined container before disposing them with other household waste.  Remove gloves and wash your hands immediately after handling these items.  Do not share dishes, glasses, or other household items with the patient  Avoid sharing household items. You should not share dishes, drinking glasses, cups, eating utensils, towels, bedding, or other items with a patient who is confirmed to have, or being evaluated for, COVID-19 infection.  After the person uses these items, you should wash them thoroughly with soap and water.  Wash laundry thoroughly  Immediately remove and wash clothes or bedding that have blood, body fluids, and/or secretions or excretions, such as sweat, saliva, sputum, nasal mucus, vomit, urine, or feces, on them.  Wear gloves when handling laundry from the patient.  Read and follow directions on labels of laundry or clothing items and detergent. In general, wash and dry with the warmest temperatures recommended on the label.  Clean all areas the individual has used often  Clean  all touchable surfaces, such as counters, tabletops, doorknobs, bathroom fixtures, toilets, phones, keyboards, tablets, and bedside tables, every day. Also, clean any surfaces that may have blood, body fluids, and/or secretions or excretions on them.  Wear gloves when cleaning surfaces the patient has come in contact with.  Use a diluted bleach solution (e.g., dilute bleach with 1 part bleach and 10 parts water) or a household disinfectant  with a label that says EPA-registered for coronaviruses. To make a bleach solution at home, add 1 tablespoon of bleach to 1 quart (4 cups) of water. For a larger supply, add  cup of bleach to 1 gallon (16 cups) of water.  Read labels of cleaning products and follow recommendations provided on product labels. Labels contain instructions for safe and effective use of the cleaning product including precautions you should take when applying the product, such as wearing gloves or eye protection and making sure you have good ventilation during use of the product.  Remove gloves and wash hands immediately after cleaning.  Monitor yourself for signs and symptoms of illness Caregivers and household members are considered close contacts, should monitor their health, and will be asked to limit movement outside of the home to the extent possible. Follow the monitoring steps for close contacts listed on the symptom monitoring form.   ? If you have additional questions, contact your local health department or call the epidemiologist on call at [phone] (available 24/7). ? This guidance is subject to change. For the most up-to-date guidance from [facility], please refer to their website: [email or web address]   If you are having a medical emergency, call 911.  Seek medical care right away. Before you go to a doctors office, urgent care or emergency department, call ahead and tell them about your recent travel, contact with someone diagnosed with COVID-19, and your symptoms. You should receive instructions from your physicians office regarding next steps of care.   When you arrive at healthcare provider, tell the healthcare staff immediately you have returned from visiting [place], [place], [place], [place] or [place]; or traveled in the [place] to [place], [place], [place], or [place]; in the last two weeks or you have been in close contact with  a person diagnosed with COVID-19 in the last 2 weeks.    Tell the health care staff about your symptoms: fever, cough and shortness of breath.  After you have been seen by a medical provider, you will be either: o Tested for (COVID-19) and discharged home on quarantine except to seek medical care if symptoms worsen, and asked to  - Stay home and avoid contact with others until you get your results (4-5 days)  - Avoid travel on public transportation if possible (such as bus, train, or airplane) or o Sent to the Emergency Department by EMS for evaluation, COVID-19 testing, and possible admission depending on your condition and test results.  What to do if you are LOW RISK for COVID-19?  Reduce your risk of any infection by using the same precautions used for avoiding the common cold or flu:   Wash your hands often with soap and warm water for at least 20 seconds.  If soap and water are not readily available, use an alcohol-based hand sanitizer with at least 60% alcohol.   If coughing or sneezing, cover your mouth and nose by coughing or sneezing into the elbow areas of your shirt or coat, into a tissue or into your sleeve (not your hands).  Avoid shaking hands  with others and consider head nods or verbal greetings only.  Avoid touching your eyes, nose, or mouth with unwashed hands.   Avoid close contact with people who are sick.  Avoid places or events with large numbers of people in one location, like concerts or sporting events.  Carefully consider travel plans you have or are making.  If you are planning any travel outside or inside the [place], visit the [facility] webpage for the latest health notices.  If you have some symptoms but not all symptoms, continue to monitor at home and seek medical attention if your symptoms worsen.  If you are having a medical emergency, call 911.   [place] / e-Visit:  [email or web address]         [facility]: [facility]: [phone]                   [facility]: [phone]

## 2018-08-16 DIAGNOSIS — L97521 Non-pressure chronic ulcer of other part of left foot limited to breakdown of skin: Secondary | ICD-10-CM

## 2018-08-16 DIAGNOSIS — E871 Hypo-osmolality and hyponatremia: Secondary | ICD-10-CM

## 2018-08-16 LAB — GLUCOSE, CAPILLARY
Glucose-Capillary: 43 mg/dL — CL (ref 70–99)
Glucose-Capillary: 127 mg/dL — ABNORMAL HIGH (ref 70–99)
Glucose-Capillary: 192 mg/dL — ABNORMAL HIGH (ref 70–99)

## 2018-08-16 NOTE — Progress Notes (Signed)
Pt will discharge home with [facility] for PT/OT.

## 2018-08-16 NOTE — Progress Notes (Signed)
Staples removed and steri strips applied. Tol well. SRP, RN

## 2018-08-16 NOTE — Plan of Care (Signed)
Plan of care reviewed and discussed with the patient. 

## 2018-08-16 NOTE — Progress Notes (Signed)
Discharge instructions reviewed with pt , COVID-19 education packet given to patient and signed copied. Prescription sent to [facility] in [place]. Pt acknowledged understanding, COVID-19 protocol completed for discharge, CN/AC/Security all notifed. Pt taken in when chair masked, with 2 staff members to escort pt. SRP, RN

## 2018-09-15 ENCOUNTER — Telehealth: Payer: Self-pay | Admitting: *Deleted

## 2018-09-15 NOTE — Telephone Encounter (Signed)
John Reilly, wife with pt [name] in background returned my call.   I had called them last week to see if [name] was interested in donating plasma since he was positive for the COVID-19 coronavirus.  [name] had the COVID-19 also but she was not tested however [name] was due to his medical situation.   [name] had the virus first.  I gave her the [name] web site name and let her know to fill out the registration form for [name] and to ask them when they return the call for [name] what she would need to do to donate.   She was agreeable to this plan.  I thanked them both for being willing to donate their plasma to help others diagnosed with COVID-19.

## 2019-04-08 ENCOUNTER — Encounter: Payer: Self-pay | Admitting: Emergency Medicine

## 2019-04-08 ENCOUNTER — Ambulatory Visit
Admission: EM | Admit: 2019-04-08 | Discharge: 2019-04-08 | Disposition: A | Discharge status: Home / Self Care | Payer: Medicare Other | Attending: Emergency Medicine | Admitting: Emergency Medicine

## 2019-04-08 ENCOUNTER — Other Ambulatory Visit: Payer: Self-pay

## 2019-04-08 DIAGNOSIS — S61451A Open bite of right hand, initial encounter: Secondary | ICD-10-CM

## 2019-04-08 DIAGNOSIS — W5501XA Bitten by cat, initial encounter: Secondary | ICD-10-CM

## 2019-04-08 MED ORDER — MUPIROCIN 2 % EX OINT: TOPICAL_OINTMENT | CUTANEOUS | 0 refills | Status: DC

## 2019-04-08 MED ORDER — DOXYCYCLINE HYCLATE 100 MG PO CAPS: 100.0000 mg | ORAL_CAPSULE | Freq: Two times a day (BID) | ORAL | 0 refills | Status: DC

## 2019-04-08 NOTE — Discharge Instructions (Signed)
Take medication as prescribed. Rest. Drink plenty of fluids. Elevate. Monitor closely. Keep clean.   Follow up with your primary care physician this week as needed. Return to Urgent care as needed. Go directly to ER for increased swelling, redness, no improvement or worsening complaints.

## 2019-04-08 NOTE — ED Provider Notes (Signed)
[facility] ____________________________________________  Time seen: Approximately 2:40 PM  I have reviewed the triage vital signs and the nursing notes.   HISTORY  Chief Complaint Animal Bite  HPI John Reilly is a 63 y.o. male past medical history of diabetes, Parkinson's and hypertension presenting for cat bite to right hand.  Patient and wife report that he was bitten 2 days ago by his cat.  States they are having to give their cat medication and during this process the cat bit patient's right hand wants.  Patient states no pain to the area.  States he noticed some redness yesterday and today prompting him to come in.  Denies any paresthesias, decreased range of motion, weakness, bony pain, drainage.  Reports his tetanus immunization is up-to-date and within 5 years.  States the cat is up-to-date on immunizations including up-to-date on rabies immunization.  Has been cleaning.  Denies other aggravating alleviating factors.  Reports otherwise doing well.  No recent fevers, cough, chest pain or shortness of breath or sickness.  Adin Hector, MD : PCP   Past Medical History:  Diagnosis Date  . Cancer ([place])    Pt reports on 10/02/17 that he has never been dx with cancer  . COPD (chronic obstructive pulmonary disease) ([place])   . Diabetes mellitus without complication ([place])   . GERD (gastroesophageal reflux disease)   . Hypertension   . Multiple duodenal ulcers   . Peripheral vascular disease ([facility])    Peripheral Neuropathy  . Retinopathy     Patient Active Problem List   Diagnosis Date Noted  . S/p left hip fracture 08/10/2018  . Altered mental status   . Closed fracture of left hip ([place])   . Fall   . DKA (diabetic ketoacidoses) ([place]) 10/13/2017  . Malnutrition of moderate degree 10/04/2017  . Humeral fracture 10/03/2017  . Ulcer of toe ([place]) 04/27/2015  . Hyponatremia 03/15/2015    Past Surgical History:  Procedure Laterality Date  . ANKLE ARTHROPLASTY      2013  . COLONOSCOPY WITH PROPOFOL N/A 10/12/2016   Procedure: COLONOSCOPY WITH PROPOFOL;  Surgeon: Manya Silvas, MD;  Location: [facility] ENDOSCOPY;  Service: Endoscopy;  Laterality: N/A;  . INTRAMEDULLARY (IM) NAIL INTERTROCHANTERIC Left 08/08/2018   Procedure: INTRAMEDULLARY (IM) NAIL INTERTROCHANTRIC;  Surgeon: Corky Mull, MD;  Location: [facility] ORS;  Service: Orthopedics;  Laterality: Left;     No current facility-administered medications for this encounter.   Current Outpatient Medications:  .  amLODipine (NORVASC) 5 MG tablet, Take 1 tablet by mouth daily as needed (BP OVER 160/90). , Disp: , Rfl:  .  B Complex-Biotin-FA (SUPER B-COMPLEX) TABS, Take 1 tablet by mouth daily., Disp: , Rfl:  .  carbidopa-levodopa (SINEMET CR) 50-200 MG tablet, Take 1 tablet by mouth at bedtime. , Disp: , Rfl:  .  carbidopa-levodopa (SINEMET IR) 25-250 MG tablet, Take 1 tablet by mouth 3 (three) times daily. , Disp: , Rfl:  .  enalapril (VASOTEC) 5 MG tablet, Take 5 mg by mouth daily., Disp: , Rfl:  .  folic acid (FOLVITE) 1 MG tablet, Take 2 mg by mouth daily. , Disp: , Rfl:  .  insulin lispro (HUMALOG) 100 UNIT/ML injection, Inject up to 30 units daily via insulin pump right as directed, Disp: , Rfl:  .  Multiple Vitamin (MULTIVITAMIN WITH MINERALS) TABS tablet, Take 1 tablet by mouth daily., Disp: , Rfl:  .  omeprazole (PRILOSEC) 40 MG capsule, Take 40 mg by mouth daily.,  Disp: , Rfl:  .  acetaminophen (TYLENOL) 325 MG tablet, Take 2 tablets (650 mg total) by mouth every 4 (four) hours as needed for moderate pain (or Fever >/= 101)., Disp: 30 tablet, Rfl: 0 .  docusate sodium (COLACE) 100 MG capsule, Take 1 capsule (100 mg total) by mouth 2 (two) times daily. (Patient not taking: Reported on 08/10/2018), Disp: 10 capsule, Rfl: 0 .  doxycycline (VIBRAMYCIN) 100 MG capsule, Take 1 capsule (100 mg total) by mouth 2 (two) times daily., Disp: 20 capsule, Rfl: 0 .  enoxaparin (LOVENOX) 40 MG/0.4ML injection,  Inject 0.4 mLs (40 mg total) into the skin daily., Disp: 20 Syringe, Rfl: 0 .  metoprolol succinate (TOPROL-XL) 50 MG 24 hr tablet, Take 1 tablet (50 mg total) by mouth daily. Take with or immediately following a meal., Disp: 30 tablet, Rfl: 0 .  mupirocin ointment (BACTROBAN) 2 %, Apply two times a day for 7 days., Disp: 22 g, Rfl: 0 .  oxyCODONE (OXY IR/ROXICODONE) 5 MG immediate release tablet, Take 1-2 tablets (5-10 mg total) by mouth every 4 (four) hours as needed for severe pain (pain score 4-6)., Disp: 30 tablet, Rfl: 0 .  thiamine 100 MG tablet, Take 1 tablet (100 mg total) by mouth daily., Disp: 30 tablet, Rfl: 0 .  traMADol (ULTRAM) 50 MG tablet, Take 1 tablet (50 mg total) by mouth every 6 (six) hours as needed for moderate pain., Disp: 30 tablet, Rfl: 0  Allergies Penicillins  Family History  Problem Relation Age of Onset  . Stroke Mother   . Alzheimer's disease Father   . Diabetes Brother     Social History Social History   Tobacco Use  . Smoking status: Former Smoker    Quit date: 10/21/2005    Years since quitting: 13.4  . Smokeless tobacco: Never Used  Substance Use Topics  . Alcohol use: Yes    Alcohol/week: 42.0 standard drinks    Types: 42 Cans of beer per week    Comment: patient states he drinks 1 quart of beer per day  . Drug use: No    Review of Systems Constitutional: No fever ENT: No sore throat. Cardiovascular: Denies chest pain. Respiratory: Denies shortness of breath. Gastrointestinal: No abdominal pain.   Musculoskeletal: Positive right hand injury.  Skin: Positive rash.   ____________________________________________   PHYSICAL EXAM:  VITAL SIGNS: ED Triage Vitals  Enc Vitals Group     BP 04/08/19 1318 (!) 151/88     Pulse Rate 04/08/19 1318 75     Resp 04/08/19 1318 18     Temp 04/08/19 1318 98.1 F (36.7 C)     Temp Source 04/08/19 1318 Oral     SpO2 04/08/19 1318 100 %     Weight 04/08/19 1320 181 lb (82.1 kg)     Height  04/08/19 1320 5\' 10"  (1.778 m)     Head Circumference --      Peak Flow --      Pain Score 04/08/19 1319 0     Pain Loc --      Pain Edu? --      Excl. in [place]? --     Constitutional: Alert and oriented. Well appearing and in no acute distress. Eyes: Conjunctivae are normal.  ENT      Head: Normocephalic and atraumatic. Cardiovascular: Normal rate, regular rhythm. Grossly normal heart sounds.  Good peripheral circulation. Respiratory: Normal respiratory effort without tachypnea nor retractions. Breath sounds are clear and equal bilaterally. No wheezes,  rales, rhonchi. Musculoskeletal: Steady gait.  Bilateral distal radial pulses equal and easily palpated. Neurologic:  Normal speech and language. Speech is normal. No gait instability.  Skin:  Skin is warm, dry.  Except: Right hand dorsal aspect at base of first digit two less than 0.5 cm scabbed areas with immediate surrounding erythema, erythema does not extend throughout the hand, no bony tenderness, minimal tenderness and swelling to erythema site, no drainage, no induration, right hand with full range of motion present, good resisted distal flexion and extension of all fingers without pain.  Psychiatric: Mood and affect are normal. Speech and behavior are normal. Patient exhibits appropriate insight and judgment.   ___________________________________________   LABS (all labs ordered are listed, but only abnormal results are displayed)  Labs Reviewed - No data to display  PROCEDURES Procedures    INITIAL IMPRESSION / ASSESSMENT AND PLAN / ED COURSE  Pertinent labs & imaging results that were available during my care of the patient were reviewed by me and considered in my medical decision making (see chart for details).  Well-appearing patient.  Wife at bedside.  Right hand cat bite from provoked encounter from his cat.  Reports immunization of cat is up-to-date including rabies immunization.  Patient's tetanus immunization is  up-to-date.  Offered x-ray of hand, patient and family declined.  Area appears to have cellulitis.  Patient penicillin allergic, will treat with doxycycline and Bactroban.  Animal control notified by nursing staff.  Very strict follow-up and return parameters given.  Discussed for any increased redness, swelling, pain or no improvement proceed directly to the emergency room.Discussed indication, risks and benefits of medications with patient.   Discussed follow up with Primary care physician this week. Discussed follow up and return parameters including no resolution or any worsening concerns. Patient verbalized understanding and agreed to plan.   ____________________________________________   FINAL CLINICAL IMPRESSION(S) / ED DIAGNOSES  Final diagnoses:  Cat bite of right hand, initial encounter     ED Discharge Orders         Ordered    doxycycline (VIBRAMYCIN) 100 MG capsule  2 times daily     04/08/19 1441    mupirocin ointment (BACTROBAN) 2 %     04/08/19 1441           Note: This dictation was prepared with Dragon dictation along with smaller phrase technology. Any transcriptional errors that result from this process are unintentional.         Marylene Land, NP 04/08/19 1725

## 2019-04-08 NOTE — ED Triage Notes (Addendum)
Pt has cat bite on his right hand. Occurred about 2 days ago. Pt has pain, swelling and redness in the area.The cat is UTD on rabies vaccines and is a house cat. Pt is diabetic. Last tetanus was 02/01/2015

## 2019-09-07 ENCOUNTER — Ambulatory Visit (INDEPENDENT_AMBULATORY_CARE_PROVIDER_SITE_OTHER): Payer: Medicare Other | Admitting: Podiatry

## 2019-09-07 ENCOUNTER — Other Ambulatory Visit: Payer: Self-pay

## 2019-09-07 ENCOUNTER — Encounter: Payer: Self-pay | Admitting: Podiatry

## 2019-09-07 DIAGNOSIS — B351 Tinea unguium: Secondary | ICD-10-CM | POA: Diagnosis not present

## 2019-09-07 DIAGNOSIS — M79675 Pain in left toe(s): Secondary | ICD-10-CM

## 2019-09-07 DIAGNOSIS — L309 Dermatitis, unspecified: Secondary | ICD-10-CM

## 2019-09-07 DIAGNOSIS — M79674 Pain in right toe(s): Secondary | ICD-10-CM | POA: Diagnosis not present

## 2019-09-07 NOTE — Progress Notes (Signed)
This patient presents the office for an evaluation of his diabetic feet he presents the office today stating that his nails have grown thick and long and are painful walking wearing his shoes.  He states he is unable to self treat.  He is accompanied by a male caregiver.  He relates that he feels that his feet are sweating all the time.  He also says that he would like to be evaluated for diabetic shoes. This patient is diabetic. He previously has been a patient of Dr. Jacqualyn Posey who treated him for an ulcer on his left foot years ago.  General Appearance  Alert, conversant and in no acute stress.  Vascular  Dorsalis pedis   pulses are palpable  Bilaterally.  Posterior tibial pulses are absent  B/L.    Capillary return is within normal limits  bilaterally. Temperature is within normal limits  Bilaterally.  Venous stasis with skin peeling legs  B/L.  Neurologic  Senn-Weinstein monofilament wire test within normal limits  bilaterally. Muscle power within normal limits bilaterally.  Nails Thick disfigured discolored nails with subungual debris  from hallux to fifth toes bilaterally. No evidence of bacterial infection or drainage bilaterally.  Orthopedic  No limitations of motion  feet .  No crepitus or effusions noted.  No bony pathology or digital deformities noted. HAV 1st MPJ left foot.  Hammer toes 2,3 right foot.  Skin  normotropic skin with no porokeratosis noted bilaterally.  No signs of infections or ulcers noted.  Hydrated area on left hallux.  Red new skin is forming on the bottom of both heels.  Onychomycosis  B/L  Diabetes  HAV  Hammer toes   IE>  Debridement of nails with a nail nipper.  Dremel tool was used without incident.  Discussed skin with this patient and there is evidence of skin changes so we discussed her soap.  Told him to apply drying powder and not moisturizer on plantar aspect feet.  Patient qualifies for diabetic shoes due to HAV and hammer toes.  To make an appointment with  Liliane Channel.  RTC 3 months for preventative foot care services.   Gardiner Barefoot DPM  .

## 2019-10-11 ENCOUNTER — Ambulatory Visit: Payer: Medicare Other | Admitting: Orthotics

## 2019-10-13 ENCOUNTER — Other Ambulatory Visit: Payer: Self-pay

## 2019-10-13 ENCOUNTER — Ambulatory Visit: Payer: Medicare Other | Admitting: Orthotics

## 2019-10-13 DIAGNOSIS — M79675 Pain in left toe(s): Secondary | ICD-10-CM

## 2019-10-13 NOTE — Progress Notes (Signed)

## 2019-11-30 ENCOUNTER — Other Ambulatory Visit: Payer: Self-pay | Admitting: Internal Medicine

## 2019-11-30 DIAGNOSIS — K862 Cyst of pancreas: Secondary | ICD-10-CM

## 2019-11-30 DIAGNOSIS — K8689 Other specified diseases of pancreas: Secondary | ICD-10-CM

## 2019-12-07 ENCOUNTER — Ambulatory Visit (INDEPENDENT_AMBULATORY_CARE_PROVIDER_SITE_OTHER): Payer: Medicare Other | Admitting: Podiatry

## 2019-12-07 ENCOUNTER — Other Ambulatory Visit: Payer: Self-pay

## 2019-12-07 ENCOUNTER — Encounter: Payer: Self-pay | Admitting: Podiatry

## 2019-12-07 DIAGNOSIS — M79675 Pain in left toe(s): Secondary | ICD-10-CM | POA: Diagnosis not present

## 2019-12-07 DIAGNOSIS — M79674 Pain in right toe(s): Secondary | ICD-10-CM

## 2019-12-07 DIAGNOSIS — E1110 Type 2 diabetes mellitus with ketoacidosis without coma: Secondary | ICD-10-CM | POA: Diagnosis not present

## 2019-12-07 DIAGNOSIS — B351 Tinea unguium: Secondary | ICD-10-CM

## 2019-12-07 NOTE — Progress Notes (Signed)
This patient returns to my office for at risk foot care.  This patient requires this care by a professional since this patient will be at risk due to having diabetes and PVD.   He presents to the office with his wife.  This patient is unable to cut nails himself since the patient cannot reach his nails.These nails are painful walking and wearing shoes.  This patient presents for at risk foot care today.  General Appearance  Alert, conversant and in no acute stress.  Vascular  Dorsalis pedis  are palpable  bilaterally. Posterior tibial pulses are absent  B/L. Capillary return is within normal limits  bilaterally. Temperature is within normal limits  Bilaterally. Venous stasis legs  B/L.  Neurologic  Senn-Weinstein monofilament wire test within normal limits  bilaterally. Muscle power within normal limits bilaterally.  Nails Thick disfigured discolored nails with subungual debris  from hallux to fifth toes bilaterally. No evidence of bacterial infection or drainage bilaterally.  Orthopedic  No limitations of motion  feet .  No crepitus or effusions noted. 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  Hammer toes 2,3 right foot.  Skin  normotropic skin with no porokeratosis noted bilaterally.  No signs of infections or ulcers noted.     Onychomycosis  Pain in right toes  Pain in left toes  Consent was obtained for treatment procedures.   Mechanical debridement of nails 1-5  bilaterally performed with a nail nipper.  Filed with dremel without incident.  Patient shoes have arrived in the clinic but the patient needs the pedorthist to dispense shoes.  Patient to schedule with Liliane Channel for dispensing his shoes.   Return office visit     3 months                 Told patient to return for periodic foot care and evaluation due to potential at risk complications.   Gardiner Barefoot DPM

## 2019-12-18 ENCOUNTER — Ambulatory Visit
Admission: RE | Admit: 2019-12-18 | Discharge: 2019-12-18 | Disposition: A | Discharge status: Home / Self Care | Payer: Medicare Other | Source: Ambulatory Visit | Attending: Internal Medicine | Admitting: Internal Medicine

## 2019-12-18 ENCOUNTER — Other Ambulatory Visit: Payer: Self-pay

## 2019-12-18 DIAGNOSIS — K8689 Other specified diseases of pancreas: Secondary | ICD-10-CM | POA: Insufficient documentation

## 2019-12-18 DIAGNOSIS — K862 Cyst of pancreas: Secondary | ICD-10-CM | POA: Diagnosis present

## 2019-12-18 MED ORDER — IOHEXOL 300 MG/ML  SOLN
100.0000 mL | Freq: Once | INTRAMUSCULAR | Status: AC | PRN
  Administered 2019-12-18: 100 mL via INTRAVENOUS

## 2019-12-20 ENCOUNTER — Ambulatory Visit: Payer: Medicare Other | Admitting: Orthotics

## 2019-12-20 ENCOUNTER — Other Ambulatory Visit: Payer: Self-pay

## 2019-12-20 DIAGNOSIS — E1110 Type 2 diabetes mellitus with ketoacidosis without coma: Secondary | ICD-10-CM

## 2019-12-20 DIAGNOSIS — B351 Tinea unguium: Secondary | ICD-10-CM

## 2019-12-20 NOTE — Progress Notes (Signed)
Did not p/up shoes due to not having inserts

## 2020-01-04 ENCOUNTER — Telehealth: Payer: Self-pay | Admitting: *Deleted

## 2020-01-04 NOTE — Telephone Encounter (Signed)
I am calling to let you know that your shoes and inserts are here.  We need to schedule you an appointment to pick those up.  "Alright, when can I come in?"  He can see you on 01/24/2020 at 11:15 am.  "Okay, that will be fine."

## 2020-01-24 ENCOUNTER — Other Ambulatory Visit: Payer: Self-pay

## 2020-01-24 ENCOUNTER — Ambulatory Visit (INDEPENDENT_AMBULATORY_CARE_PROVIDER_SITE_OTHER): Payer: Medicare Other | Admitting: Orthotics

## 2020-01-24 DIAGNOSIS — M2011 Hallux valgus (acquired), right foot: Secondary | ICD-10-CM | POA: Diagnosis not present

## 2020-01-24 DIAGNOSIS — E1110 Type 2 diabetes mellitus with ketoacidosis without coma: Secondary | ICD-10-CM

## 2020-01-24 DIAGNOSIS — M2042 Other hammer toe(s) (acquired), left foot: Secondary | ICD-10-CM | POA: Diagnosis not present

## 2020-01-24 DIAGNOSIS — M2041 Other hammer toe(s) (acquired), right foot: Secondary | ICD-10-CM | POA: Diagnosis not present

## 2020-01-24 DIAGNOSIS — E119 Type 2 diabetes mellitus without complications: Secondary | ICD-10-CM

## 2020-01-24 DIAGNOSIS — L309 Dermatitis, unspecified: Secondary | ICD-10-CM

## 2020-01-24 NOTE — Progress Notes (Signed)

## 2020-03-11 ENCOUNTER — Other Ambulatory Visit: Payer: Self-pay

## 2020-03-11 ENCOUNTER — Encounter: Payer: Self-pay | Admitting: Podiatry

## 2020-03-11 ENCOUNTER — Ambulatory Visit (INDEPENDENT_AMBULATORY_CARE_PROVIDER_SITE_OTHER): Payer: Medicare Other | Admitting: Podiatry

## 2020-03-11 DIAGNOSIS — E1159 Type 2 diabetes mellitus with other circulatory complications: Secondary | ICD-10-CM | POA: Insufficient documentation

## 2020-03-11 DIAGNOSIS — M79674 Pain in right toe(s): Secondary | ICD-10-CM

## 2020-03-11 DIAGNOSIS — M79675 Pain in left toe(s): Secondary | ICD-10-CM

## 2020-03-11 DIAGNOSIS — M2011 Hallux valgus (acquired), right foot: Secondary | ICD-10-CM | POA: Diagnosis not present

## 2020-03-11 DIAGNOSIS — M2041 Other hammer toe(s) (acquired), right foot: Secondary | ICD-10-CM

## 2020-03-11 DIAGNOSIS — B351 Tinea unguium: Secondary | ICD-10-CM

## 2020-03-11 DIAGNOSIS — M2042 Other hammer toe(s) (acquired), left foot: Secondary | ICD-10-CM

## 2020-03-11 NOTE — Progress Notes (Signed)
This patient returns to my office for at risk foot care.  This patient requires this care by a professional since this patient will be at risk due to having diabetes and PVD.   Marland Kitchen  This patient is unable to cut nails himself since the patient cannot reach his nails.These nails are painful walking and wearing shoes.  This patient presents for at risk foot care today.  General Appearance  Alert, conversant and in no acute stress.  Vascular  Dorsalis pedis  are palpable  bilaterally. Posterior tibial pulses are absent  B/L. Capillary return is within normal limits  bilaterally. Temperature is within normal limits  Bilaterally. Venous stasis legs  B/L.  Neurologic  Senn-Weinstein monofilament wire test within normal limits  bilaterally. Muscle power within normal limits bilaterally.  Nails Thick disfigured discolored nails with subungual debris  from hallux to fifth toes bilaterally. No evidence of bacterial infection or drainage bilaterally.  Orthopedic  No limitations of motion  feet .  No crepitus or effusions noted. 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  Hammer toes 2,3 right foot.  Skin  normotropic skin with no porokeratosis noted bilaterally.  No signs of infections or ulcers noted.     Onychomycosis  Pain in right toes  Pain in left toes  Consent was obtained for treatment procedures.   Mechanical debridement of nails 1-5  bilaterally performed with a nail nipper.  Filed with dremel without incident.  Padding dispensed due to hammer toe second toe right foot.   Return office visit     3 months                 Told patient to return for periodic foot care and evaluation due to potential at risk complications.   Gardiner Barefoot DPM

## 2020-03-21 ENCOUNTER — Emergency Department: Payer: Medicare Other

## 2020-03-21 ENCOUNTER — Other Ambulatory Visit: Payer: Self-pay

## 2020-03-21 ENCOUNTER — Inpatient Hospital Stay
Admission: EM | Admit: 2020-03-21 | Discharge: 2020-03-24 | DRG: 177 | Disposition: A | Discharge status: Home Health | Payer: Medicare Other | Attending: Internal Medicine | Admitting: Internal Medicine

## 2020-03-21 DIAGNOSIS — G309 Alzheimer's disease, unspecified: Secondary | ICD-10-CM | POA: Diagnosis present

## 2020-03-21 DIAGNOSIS — F0280 Dementia in other diseases classified elsewhere without behavioral disturbance: Secondary | ICD-10-CM | POA: Diagnosis present

## 2020-03-21 DIAGNOSIS — T383X5A Adverse effect of insulin and oral hypoglycemic [antidiabetic] drugs, initial encounter: Secondary | ICD-10-CM | POA: Diagnosis not present

## 2020-03-21 DIAGNOSIS — G9341 Metabolic encephalopathy: Secondary | ICD-10-CM | POA: Diagnosis present

## 2020-03-21 DIAGNOSIS — E871 Hypo-osmolality and hyponatremia: Secondary | ICD-10-CM | POA: Diagnosis present

## 2020-03-21 DIAGNOSIS — Z880 Allergy status to penicillin: Secondary | ICD-10-CM

## 2020-03-21 DIAGNOSIS — Z96669 Presence of unspecified artificial ankle joint: Secondary | ICD-10-CM | POA: Diagnosis present

## 2020-03-21 DIAGNOSIS — E11649 Type 2 diabetes mellitus with hypoglycemia without coma: Secondary | ICD-10-CM | POA: Diagnosis not present

## 2020-03-21 DIAGNOSIS — I340 Nonrheumatic mitral (valve) insufficiency: Secondary | ICD-10-CM | POA: Diagnosis not present

## 2020-03-21 DIAGNOSIS — E162 Hypoglycemia, unspecified: Secondary | ICD-10-CM

## 2020-03-21 DIAGNOSIS — J440 Chronic obstructive pulmonary disease with acute lower respiratory infection: Secondary | ICD-10-CM | POA: Diagnosis present

## 2020-03-21 DIAGNOSIS — Z23 Encounter for immunization: Secondary | ICD-10-CM

## 2020-03-21 DIAGNOSIS — E1151 Type 2 diabetes mellitus with diabetic peripheral angiopathy without gangrene: Secondary | ICD-10-CM | POA: Diagnosis present

## 2020-03-21 DIAGNOSIS — G20 Parkinson's disease: Secondary | ICD-10-CM | POA: Diagnosis present

## 2020-03-21 DIAGNOSIS — E876 Hypokalemia: Secondary | ICD-10-CM | POA: Diagnosis present

## 2020-03-21 DIAGNOSIS — Z882 Allergy status to sulfonamides status: Secondary | ICD-10-CM

## 2020-03-21 DIAGNOSIS — Z833 Family history of diabetes mellitus: Secondary | ICD-10-CM

## 2020-03-21 DIAGNOSIS — I110 Hypertensive heart disease with heart failure: Secondary | ICD-10-CM | POA: Diagnosis present

## 2020-03-21 DIAGNOSIS — R7989 Other specified abnormal findings of blood chemistry: Secondary | ICD-10-CM | POA: Diagnosis not present

## 2020-03-21 DIAGNOSIS — Z79899 Other long term (current) drug therapy: Secondary | ICD-10-CM

## 2020-03-21 DIAGNOSIS — E1142 Type 2 diabetes mellitus with diabetic polyneuropathy: Secondary | ICD-10-CM | POA: Diagnosis present

## 2020-03-21 DIAGNOSIS — E1159 Type 2 diabetes mellitus with other circulatory complications: Secondary | ICD-10-CM | POA: Diagnosis not present

## 2020-03-21 DIAGNOSIS — K219 Gastro-esophageal reflux disease without esophagitis: Secondary | ICD-10-CM | POA: Diagnosis present

## 2020-03-21 DIAGNOSIS — E785 Hyperlipidemia, unspecified: Secondary | ICD-10-CM | POA: Diagnosis present

## 2020-03-21 DIAGNOSIS — I5032 Chronic diastolic (congestive) heart failure: Secondary | ICD-10-CM | POA: Diagnosis present

## 2020-03-21 DIAGNOSIS — E160 Drug-induced hypoglycemia without coma: Secondary | ICD-10-CM | POA: Diagnosis not present

## 2020-03-21 DIAGNOSIS — R4182 Altered mental status, unspecified: Secondary | ICD-10-CM | POA: Diagnosis present

## 2020-03-21 DIAGNOSIS — Z9641 Presence of insulin pump (external) (internal): Secondary | ICD-10-CM | POA: Diagnosis present

## 2020-03-21 DIAGNOSIS — J690 Pneumonitis due to inhalation of food and vomit: Principal | ICD-10-CM | POA: Diagnosis present

## 2020-03-21 DIAGNOSIS — R609 Edema, unspecified: Secondary | ICD-10-CM

## 2020-03-21 DIAGNOSIS — Z820 Family history of epilepsy and other diseases of the nervous system: Secondary | ICD-10-CM

## 2020-03-21 DIAGNOSIS — Z823 Family history of stroke: Secondary | ICD-10-CM

## 2020-03-21 DIAGNOSIS — M4856XA Collapsed vertebra, not elsewhere classified, lumbar region, initial encounter for fracture: Secondary | ICD-10-CM | POA: Diagnosis present

## 2020-03-21 DIAGNOSIS — I248 Other forms of acute ischemic heart disease: Secondary | ICD-10-CM | POA: Diagnosis present

## 2020-03-21 DIAGNOSIS — E11319 Type 2 diabetes mellitus with unspecified diabetic retinopathy without macular edema: Secondary | ICD-10-CM | POA: Diagnosis present

## 2020-03-21 DIAGNOSIS — Z20822 Contact with and (suspected) exposure to covid-19: Secondary | ICD-10-CM | POA: Diagnosis present

## 2020-03-21 DIAGNOSIS — J189 Pneumonia, unspecified organism: Secondary | ICD-10-CM

## 2020-03-21 DIAGNOSIS — Z794 Long term (current) use of insulin: Secondary | ICD-10-CM

## 2020-03-21 DIAGNOSIS — A419 Sepsis, unspecified organism: Secondary | ICD-10-CM | POA: Diagnosis present

## 2020-03-21 DIAGNOSIS — Z87891 Personal history of nicotine dependence: Secondary | ICD-10-CM

## 2020-03-21 LAB — TROPONIN I (HIGH SENSITIVITY)
Troponin I (High Sensitivity): 39 ng/L — ABNORMAL HIGH (ref <18)
Troponin I (High Sensitivity): 82 ng/L — ABNORMAL HIGH (ref <18)

## 2020-03-21 LAB — BLOOD GAS, VENOUS
Acid-base deficit: 3.2 mmol/L — ABNORMAL HIGH (ref 0.0–2.0)
Bicarbonate: 25.8 mmol/L (ref 20.0–28.0)
O2 Saturation: 57.6 %
Patient temperature: 37
pCO2, Ven: 63 mmHg — ABNORMAL HIGH (ref 44.0–60.0)
pH, Ven: 7.22 — ABNORMAL LOW (ref 7.250–7.430)
pO2, Ven: 37 mmHg (ref 32.0–45.0)

## 2020-03-21 LAB — RESPIRATORY PANEL BY RT PCR (FLU A&B, COVID)
Influenza A by PCR: NEGATIVE
Influenza B by PCR: NEGATIVE
SARS Coronavirus 2 by RT PCR: NEGATIVE

## 2020-03-21 LAB — CBC
HCT: 37 % — ABNORMAL LOW (ref 39.0–52.0)
Hemoglobin: 12.2 g/dL — ABNORMAL LOW (ref 13.0–17.0)
MCH: 30.9 pg (ref 26.0–34.0)
MCHC: 33 g/dL (ref 30.0–36.0)
MCV: 93.7 fL (ref 80.0–100.0)
Platelets: 370 10*3/uL (ref 150–400)
RBC: 3.95 MIL/uL — ABNORMAL LOW (ref 4.22–5.81)
RDW: 12.8 % (ref 11.5–15.5)
WBC: 9.3 10*3/uL (ref 4.0–10.5)
nRBC: 0 % (ref 0.0–0.2)

## 2020-03-21 LAB — COMPREHENSIVE METABOLIC PANEL
ALT: <5 U/L (ref 0–44)
AST: 20 U/L (ref 15–41)
Albumin: 3.5 g/dL (ref 3.5–5.0)
Alkaline Phosphatase: 92 U/L (ref 38–126)
Anion gap: 14 (ref 5–15)
BUN: 16 mg/dL (ref 8–23)
CO2: 23 mmol/L (ref 22–32)
Calcium: 8.5 mg/dL — ABNORMAL LOW (ref 8.9–10.3)
Chloride: 92 mmol/L — ABNORMAL LOW (ref 98–111)
Creatinine, Ser: 1.15 mg/dL (ref 0.61–1.24)
GFR, Estimated: >60 mL/min (ref >60)
Glucose, Bld: 46 mg/dL — ABNORMAL LOW (ref 70–99)
Potassium: 3.4 mmol/L — ABNORMAL LOW (ref 3.5–5.1)
Sodium: 129 mmol/L — ABNORMAL LOW (ref 135–145)
Total Bilirubin: 0.5 mg/dL (ref 0.3–1.2)
Total Protein: 7.5 g/dL (ref 6.5–8.1)

## 2020-03-21 LAB — OSMOLALITY: Osmolality: 282 mOsm/kg (ref 275–295)

## 2020-03-21 LAB — LACTIC ACID, PLASMA
Lactic Acid, Venous: 4.4 mmol/L (ref 0.5–1.9)
Lactic Acid, Venous: 0.7 mmol/L (ref 0.5–1.9)

## 2020-03-21 LAB — CBG MONITORING, ED
Glucose-Capillary: 131 mg/dL — ABNORMAL HIGH (ref 70–99)
Glucose-Capillary: 76 mg/dL (ref 70–99)
Glucose-Capillary: 64 mg/dL — ABNORMAL LOW (ref 70–99)
Glucose-Capillary: 140 mg/dL — ABNORMAL HIGH (ref 70–99)

## 2020-03-21 LAB — OSMOLALITY, URINE: Osmolality, Ur: 362 mOsm/kg (ref 300–900)

## 2020-03-21 LAB — GLUCOSE, CAPILLARY
Glucose-Capillary: 159 mg/dL — ABNORMAL HIGH (ref 70–99)
Glucose-Capillary: 257 mg/dL — ABNORMAL HIGH (ref 70–99)

## 2020-03-21 LAB — CK: Total CK: 46 U/L — ABNORMAL LOW (ref 49–397)

## 2020-03-21 MED ORDER — METRONIDAZOLE IN NACL 5-0.79 MG/ML-% IV SOLN
500.0000 mg | Freq: Three times a day (TID) | INTRAVENOUS | Status: DC
  Administered 2020-03-22: 500 mg via INTRAVENOUS
  Filled 2020-03-21 (×3): qty 100

## 2020-03-21 MED ORDER — POLYETHYLENE GLYCOL 3350 17 G PO PACK: 17.0000 g | PACK | Freq: Every day | ORAL | Status: DC | PRN

## 2020-03-21 MED ORDER — ONDANSETRON HCL 4 MG PO TABS: 4.0000 mg | ORAL_TABLET | Freq: Four times a day (QID) | ORAL | Status: DC | PRN

## 2020-03-21 MED ORDER — DEXTROSE 50 % IV SOLN
25.0000 g | Freq: Once | INTRAVENOUS | Status: AC
  Administered 2020-03-21: 25 g via INTRAVENOUS
  Filled 2020-03-21: qty 50

## 2020-03-21 MED ORDER — CARBIDOPA-LEVODOPA ER 50-200 MG PO TBCR
1.0000 | EXTENDED_RELEASE_TABLET | Freq: Every day | ORAL | Status: DC
  Administered 2020-03-22 – 2020-03-23 (×2): 1 via ORAL
  Filled 2020-03-21 (×4): qty 1

## 2020-03-21 MED ORDER — PANTOPRAZOLE SODIUM 40 MG PO TBEC
40.0000 mg | DELAYED_RELEASE_TABLET | Freq: Every day | ORAL | Status: DC
  Administered 2020-03-23: 40 mg via ORAL
  Filled 2020-03-21: qty 1

## 2020-03-21 MED ORDER — SODIUM CHLORIDE 0.9 % IV SOLN
1.0000 g | Freq: Once | INTRAVENOUS | Status: AC
  Administered 2020-03-21: 1 g via INTRAVENOUS
  Filled 2020-03-21: qty 10

## 2020-03-21 MED ORDER — BISACODYL 5 MG PO TBEC: 5.0000 mg | DELAYED_RELEASE_TABLET | Freq: Every day | ORAL | Status: DC | PRN

## 2020-03-21 MED ORDER — GUAIFENESIN ER 600 MG PO TB12
600.0000 mg | ORAL_TABLET | Freq: Two times a day (BID) | ORAL | Status: DC
  Administered 2020-03-22 – 2020-03-23 (×3): 600 mg via ORAL
  Filled 2020-03-21 (×4): qty 1

## 2020-03-21 MED ORDER — DEXTROSE-NACL 10-0.45 % IV SOLN
INTRAVENOUS | Status: DC
  Filled 2020-03-21 (×4): qty 1000

## 2020-03-21 MED ORDER — LORAZEPAM 2 MG/ML IJ SOLN
INTRAMUSCULAR | Status: AC
  Administered 2020-03-21: 1 mg via INTRAVENOUS
  Filled 2020-03-21: qty 1

## 2020-03-21 MED ORDER — DEXTROSE-NACL 5-0.45 % IV SOLN: INTRAVENOUS | Status: DC

## 2020-03-21 MED ORDER — SODIUM CHLORIDE 0.9 % IV BOLUS
2500.0000 mL | Freq: Once | INTRAVENOUS | Status: AC
  Administered 2020-03-21: 2500 mL via INTRAVENOUS

## 2020-03-21 MED ORDER — RIVASTIGMINE TARTRATE 1.5 MG PO CAPS
1.5000 mg | ORAL_CAPSULE | Freq: Two times a day (BID) | ORAL | Status: DC
  Administered 2020-03-22 – 2020-03-24 (×4): 1.5 mg via ORAL
  Filled 2020-03-21 (×7): qty 1

## 2020-03-21 MED ORDER — DEXTROSE-NACL 5-0.9 % IV SOLN: INTRAVENOUS | Status: DC

## 2020-03-21 MED ORDER — CARBIDOPA-LEVODOPA 25-250 MG PO TABS
1.0000 | ORAL_TABLET | Freq: Four times a day (QID) | ORAL | Status: DC
  Administered 2020-03-22 – 2020-03-24 (×8): 1 via ORAL
  Filled 2020-03-21 (×12): qty 1

## 2020-03-21 MED ORDER — METRONIDAZOLE IN NACL 5-0.79 MG/ML-% IV SOLN
500.0000 mg | Freq: Once | INTRAVENOUS | Status: AC
  Administered 2020-03-21: 500 mg via INTRAVENOUS
  Filled 2020-03-21: qty 100

## 2020-03-21 MED ORDER — ACETAMINOPHEN 650 MG RE SUPP
650.0000 mg | Freq: Four times a day (QID) | RECTAL | Status: DC | PRN
  Administered 2020-03-21: 650 mg via RECTAL
  Filled 2020-03-21: qty 1

## 2020-03-21 MED ORDER — SODIUM CHLORIDE 0.9 % IV SOLN
1.0000 g | INTRAVENOUS | Status: DC
  Administered 2020-03-22 – 2020-03-24 (×3): 1 g via INTRAVENOUS
  Filled 2020-03-21 (×3): qty 1

## 2020-03-21 MED ORDER — PREGABALIN 25 MG PO CAPS
25.0000 mg | ORAL_CAPSULE | Freq: Two times a day (BID) | ORAL | Status: DC
  Administered 2020-03-22 – 2020-03-24 (×4): 25 mg via ORAL
  Filled 2020-03-21 (×4): qty 1

## 2020-03-21 MED ORDER — ACETAMINOPHEN 325 MG PO TABS: 650.0000 mg | ORAL_TABLET | Freq: Four times a day (QID) | ORAL | Status: DC | PRN

## 2020-03-21 MED ORDER — ENOXAPARIN SODIUM 40 MG/0.4ML ~~LOC~~ SOLN
40.0000 mg | SUBCUTANEOUS | Status: DC
  Administered 2020-03-21 – 2020-03-23 (×3): 40 mg via SUBCUTANEOUS
  Filled 2020-03-21 (×3): qty 0.4

## 2020-03-21 MED ORDER — IPRATROPIUM BROMIDE 0.02 % IN SOLN: 0.5000 mg | Freq: Four times a day (QID) | RESPIRATORY_TRACT | Status: DC | PRN

## 2020-03-21 MED ORDER — AMLODIPINE BESYLATE 5 MG PO TABS
5.0000 mg | ORAL_TABLET | Freq: Every day | ORAL | Status: DC
  Administered 2020-03-23 – 2020-03-24 (×2): 5 mg via ORAL
  Filled 2020-03-21 (×2): qty 1

## 2020-03-21 MED ORDER — CARBIDOPA-LEVODOPA 25-250 MG PO TABS
1.0000 | ORAL_TABLET | Freq: Four times a day (QID) | ORAL | Status: DC
  Filled 2020-03-21 (×2): qty 1

## 2020-03-21 MED ORDER — LORAZEPAM 2 MG/ML IJ SOLN: 1.0000 mg | Freq: Once | INTRAMUSCULAR | Status: AC

## 2020-03-21 MED ORDER — METOPROLOL SUCCINATE ER 50 MG PO TB24
50.0000 mg | ORAL_TABLET | Freq: Every day | ORAL | Status: DC
  Administered 2020-03-23: 50 mg via ORAL
  Filled 2020-03-21: qty 1

## 2020-03-21 MED ORDER — INSULIN ASPART 100 UNIT/ML ~~LOC~~ SOLN
0.0000 [IU] | SUBCUTANEOUS | Status: DC
  Administered 2020-03-21: 8 [IU] via SUBCUTANEOUS
  Administered 2020-03-21: 3 [IU] via SUBCUTANEOUS
  Administered 2020-03-22: 2 [IU] via SUBCUTANEOUS
  Administered 2020-03-22: 5 [IU] via SUBCUTANEOUS
  Filled 2020-03-21 (×2): qty 1

## 2020-03-21 MED ORDER — DEXTROSE 50 % IV SOLN
1.0000 | Freq: Once | INTRAVENOUS | Status: AC
  Administered 2020-03-21: 50 mL via INTRAVENOUS

## 2020-03-21 MED ORDER — SODIUM CHLORIDE 0.9 % IV SOLN: INTRAVENOUS | Status: DC

## 2020-03-21 MED ORDER — ONDANSETRON HCL 4 MG/2ML IJ SOLN: 4.0000 mg | Freq: Four times a day (QID) | INTRAMUSCULAR | Status: DC | PRN

## 2020-03-21 MED ORDER — ALBUTEROL SULFATE (2.5 MG/3ML) 0.083% IN NEBU: 2.5000 mg | INHALATION_SOLUTION | RESPIRATORY_TRACT | Status: DC | PRN

## 2020-03-21 NOTE — ED Triage Notes (Signed)
Pt arrives via EMS with hypoglycemia concerns.

## 2020-03-21 NOTE — ED Notes (Addendum)
Pt bladder scanned by this RN and Trish edt. Bladder scan volume shows more than 25ml in multiple areas.

## 2020-03-21 NOTE — ED Notes (Signed)
Date and time results received: 03/21/20  (use smartphrase ".now" to insert current time)  Test: Lactic Critical Value: 4.4  Name of Provider Notified: Paduchowski  Orders Received? Or Actions Taken?:

## 2020-03-21 NOTE — ED Provider Notes (Addendum)
[facility] Emergency Department Provider Note  Time seen: 3:56 PM  I have reviewed the triage vital signs and the nursing notes.   HISTORY  Chief Complaint Altered Mental Status   HPI John Reilly is a 64 y.o. male with a past medical history of COPD, diabetes, gastric reflux, hypertension, presents to the emergency department for altered mental status/decreased responsiveness.  According to EMS report patient was found down at home.  Family was at home today and is not clear how long the patient has been down for.  Patient found of a blood glucose of 24.  Per EMS wife states the patient's insulin pump has been malfunctioning for the past 5 days since he received it.  States he was hypoglycemic last night but received dextrose and it came back up.  EMS states patient was completely unresponsive upon their arrival.  They gave the patient dextrose and brought him to the emergency department.  Here the patient is agitated, moving around in bed, moaning at times, will occasionally look at you but is not answering questions or following commands.  Patient has coarse rhonchi bilaterally.   Past Medical History:  Diagnosis Date  . Cancer ([place])    Pt reports on 10/02/17 that he has never been dx with cancer  . COPD (chronic obstructive pulmonary disease) ([place])   . Diabetes mellitus without complication ([place])   . GERD (gastroesophageal reflux disease)   . Hypertension   . Multiple duodenal ulcers   . Peripheral vascular disease ([facility])    Peripheral Neuropathy  . Retinopathy     Patient Active Problem List   Diagnosis Date Noted  . Type 2 diabetes mellitus with vascular disease ([place]) 03/11/2020  . Pain due to onychomycosis of toenails of both feet 09/07/2019  . Dermatitis 09/07/2019  . S/p left hip fracture 08/10/2018  . Altered mental status   . Closed fracture of left hip ([place])   . Fall   . DKA (diabetic ketoacidoses) 10/13/2017  . Malnutrition of moderate degree  10/04/2017  . Humeral fracture 10/03/2017  . Ulcer of toe ([place]) 04/27/2015  . Hyponatremia 03/15/2015    Past Surgical History:  Procedure Laterality Date  . ANKLE ARTHROPLASTY     2013  . COLONOSCOPY WITH PROPOFOL N/A 10/12/2016   Procedure: COLONOSCOPY WITH PROPOFOL;  Surgeon: Manya Silvas, MD;  Location: [facility] ENDOSCOPY;  Service: Endoscopy;  Laterality: N/A;  . INTRAMEDULLARY (IM) NAIL INTERTROCHANTERIC Left 08/08/2018   Procedure: INTRAMEDULLARY (IM) NAIL INTERTROCHANTRIC;  Surgeon: Corky Mull, MD;  Location: [facility] ORS;  Service: Orthopedics;  Laterality: Left;    Prior to Admission medications   Medication Sig Start Date End Date Taking? Authorizing Provider  acetaminophen (TYLENOL) 325 MG tablet Take 2 tablets (650 mg total) by mouth every 4 (four) hours as needed for moderate pain (or Fever >/= 101). 10/04/17   Max Sane, MD  amLODipine (NORVASC) 5 MG tablet Take 1 tablet by mouth daily as needed (BP OVER 160/90).  07/22/17   [provider]  azelastine (ASTELIN) 0.1 % nasal spray Place into the nose. 03/06/20 03/06/21  [provider]  B Complex Vitamins (VITAMIN B COMPLEX) TABS Take by mouth.    [provider]  B Complex-Biotin-FA (SUPER B-COMPLEX) TABS Take 1 tablet by mouth daily.    [provider]  benzonatate (TESSALON) 200 MG capsule Take by mouth. 03/06/20   [provider]  carbidopa-levodopa (SINEMET IR) 25-250 MG tablet Take by mouth. 07/28/18  [provider]  clobetasol ointment (TEMOVATE) 8.31 % Apply 1 application topically 2 (two) times daily. 11/16/19   [provider]  diclofenac (VOLTAREN) 75 MG EC tablet Take by mouth. 02/25/17   [provider]  docusate sodium (COLACE) 100 MG capsule Take 1 capsule (100 mg total) by mouth 2 (two) times daily. 08/10/18   Vaughan Basta, MD  enalapril (VASOTEC) 5 MG tablet Take 5 mg by mouth daily. 08/02/18   [provider]  FOLIC ACID PO  SMARTSIG:1 Tablet(s) By Mouth Daily 04/26/19   [provider]  furosemide (LASIX) 40 MG tablet Take 40 mg by mouth daily. 07/26/19   [provider]  glucose blood (CONTOUR NEXT TEST) test strip Use 6 (six) times daily Use as instructed. 04/26/18   [provider]  Insulin Infusion Pump Supplies (PARADIGM PUMP RESERVOIR 1.8ML) MISC Inject 1 Syringe as directed as directed quick set  [23" 4m infusion set for paradigm pump] 09/07/18   [provider]  insulin lispro (HUMALOG) 100 UNIT/ML injection Inject as directied 03/29/19   [provider]  metoprolol succinate (TOPROL-XL) 100 MG 24 hr tablet Take by mouth.    [provider]  Multiple Vitamin (MULTIVITAMIN WITH MINERALS) TABS tablet Take 1 tablet by mouth daily.    [provider]  mupirocin ointment (BACTROBAN) 2 % Apply two times a day for 7 days. 04/08/19   Marylene Land, NP  omeprazole (PRILOSEC) 40 MG capsule Take by mouth. 11/23/18   [provider]  pregabalin (LYRICA) 25 MG capsule Take 25 mg by mouth 2 (two) times daily. 11/29/19   [provider]  rivastigmine (EXELON) 1.5 MG capsule Take by mouth. 02/21/20 02/20/21  [provider]  sodium chloride 1 g tablet Take 1 g by mouth 2 (two) times daily. 04/14/19   [provider]  thiamine 100 MG tablet Take 1 tablet (100 mg total) by mouth daily. 10/20/17   Loletha Grayer, MD  traMADol (ULTRAM) 50 MG tablet Take 1 tablet (50 mg total) by mouth every 6 (six) hours as needed for moderate pain. 08/10/18   Vaughan Basta, MD  UNABLE TO FIND Take by mouth.    [provider]    Allergies  Allergen Reactions  . Penicillins Rash and Other (See Comments)    Has patient had a PCN reaction causing immediate rash, facial/tongue/throat swelling, SOB or lightheadedness with hypotension: Yes Has patient had a PCN reaction causing severe rash involving mucus membranes or skin necrosis:  No Has patient had a PCN reaction that required hospitalization: No Has patient had a PCN reaction occurring within the last 10 years: No If all of the above answers are "NO", then may proceed with Cephalosporin use. Other reaction(s): RASH Has patient had a PCN reaction causing immediate rash, facial/tongue/throat swelling, SOB or lightheadedness with hypotension: Yes Has patient had a PCN reaction causing severe rash involving mucus membranes or skin necrosis: No Has patient had a PCN reaction that required hospitalization: No Has patient had a PCN reaction occurring within the last 10 years: No If all of the above answers are "NO", then may proceed with Cephalosporin use. Reaction: Unknown; childhood reaction  Has patient had a PCN reaction causing immediate rash, facial/tongue/throat swelling, SOB or lightheadedness with hypotension: Yes Has patient had a PCN reaction causing severe rash involving mucus membranes or skin necrosis: No Has patient had a PCN reaction that required hospitalization: No Has patient had a PCN reaction occurring within the last 10  years: No If all of the above answers are "NO", then may proceed with Cephalosporin use.  . Sulfa Antibiotics Rash    Katherina Right Syndrome (SJS)     Family History  Problem Relation Age of Onset  . Stroke Mother   . Alzheimer's disease Father   . Diabetes Brother     Social History Social History   Tobacco Use  . Smoking status: Former Smoker    Quit date: 10/21/2005    Years since quitting: 14.4  . Smokeless tobacco: Never Used  Vaping Use  . Vaping Use: Never used  Substance Use Topics  . Alcohol use: Yes    Alcohol/week: 42.0 standard drinks    Types: 42 Cans of beer per week    Comment: patient states he drinks 1 quart of beer per day  . Drug use: No    Review of Systems Unable to obtain adequate/accurate review of systems secondary to altered mental  status  ____________________________________________   PHYSICAL EXAM:  VITAL SIGNS: ED Triage Vitals [03/21/20 1553]  Enc Vitals Group     BP (!) 156/85     Pulse Rate 98     Resp 18     Temp 98.9 F (37.2 C)     Temp Source Axillary     SpO2 100 %     Weight      Height      Head Circumference      Peak Flow      Pain Score      Pain Loc      Pain Edu?      Excl. in [place]?     Constitutional: Patient is awake, confused and agitated.  Unable to follow commands or answer questions. Eyes: Normal exam ENT      Head: Normocephalic and atraumatic.      Mouth/Throat: Dry appearing mucous membranes. Cardiovascular: Normal rate, regular rhythm. No murmur Respiratory: Mild tachypnea with bilateral rhonchi. Gastrointestinal: Soft, no reaction to palpation.  No distention. Musculoskeletal: Appears to move all extremities at times.  Peers have normal range of motion. Neurologic: Confused.  Appears to move all extremities at times. Skin:  Skin is warm, somewhat diaphoretic. Psychiatric: Confused/altered  ____________________________________________    EKG  EKG viewed and interpreted by myself shows a sinus rhythm at 94 bpm with a narrow QRS, normal axis, normal intervals, nonspecific ST changes.  Electrical interference present.  Repeat EKG viewed and interpreted by myself shows sinus tachycardia 104 bpm with a narrow QRS, normal axis, normal intervals with nonspecific ST changes.  ____________________________________________    RADIOLOGY  Chest x-ray appears to show a left lower lung consolidation.  Could be consistent with aspiration pneumonia.  ____________________________________________   INITIAL IMPRESSION / ASSESSMENT AND PLAN / ED COURSE  Pertinent labs & imaging results that were available during my care of the patient were reviewed by me and considered in my medical decision making (see chart for details).   Patient presents to the emergency department  unresponsive/altered found to have a blood glucose of 24.  EMS gave dextrose, recheck prior to arrival of 164.  Recheck in the emergency department is 87.  We will dose additional amp of dextrose, start the patient on D10 half-normal infusion.  We will order labs including VBG, Covid.  Given the patient's coarse rhonchi we will obtain a chest x-ray suspicion for possible aspiration.  We will continue to closely monitor while awaiting results.  Awaiting family for further history.  Patient continues to  be agitated although somewhat more redirectable.  Received IV Ativan for significant agitation.  We have been able to take the patient off the nonrebreather currently satting in the upper 90s on room air.  Patient's blood glucose continues to elevate and decline, he is now on a D5 half-normal drip.  Remainder the lab work is largely [place].  Chest x-ray appears to show a left lower lobe consolidation this could be consistent with aspiration given his presentation.  We will start the patient on IV Rocephin and Flagyl given penicillin allergy of rash.  Patient's wife is now here states she left the patient at 10 AM at that time he was awake alert oriented acting normal until he was found around 3 PM.  Patient will require admission to the hospitalist service for further work-up and treatment.  Lactate of 4.4.  We will dose 30 mL/kg of fluids as a precaution.  CRITICAL CARE Performed by: Harvest Dark   Total critical care time: 30 minutes  Critical care time was exclusive of separately billable procedures and treating other patients.  Critical care was necessary to treat or prevent imminent or life-threatening deterioration.  Critical care was time spent personally by me on the following activities: development of treatment plan with patient and/or surrogate as well as nursing, discussions with consultants, evaluation of patient's response to treatment, examination of patient, obtaining history  from patient or surrogate, ordering and performing treatments and interventions, ordering and review of laboratory studies, ordering and review of radiographic studies, pulse oximetry and re-evaluation of patient's condition.   John Reilly was evaluated in Emergency Department on 03/21/2020 for the symptoms described in the history of present illness. He was evaluated in the context of the global COVID-19 pandemic, which necessitated consideration that the patient might be at risk for infection with the SARS-CoV-2 virus that causes COVID-19. Institutional protocols and algorithms that pertain to the evaluation of patients at risk for COVID-19 are in a state of rapid change based on information released by regulatory bodies including the CDC and federal and state organizations. These policies and algorithms were followed during the patient's care in the ED.  ____________________________________________   FINAL CLINICAL IMPRESSION(S) / ED DIAGNOSES  Altered mental status Hypoglycemia Aspiration pneumonia   Harvest Dark, MD 03/21/20 1730    Harvest Dark, MD 03/21/20 1730

## 2020-03-21 NOTE — ED Notes (Signed)
Repeat blood sugar after Dextrose amp- 124

## 2020-03-21 NOTE — H&P (Signed)
[facility] History and Physical   Patient: John Reilly KKX:[id number]   PCP: Adin Hector, MD DOB: 08-06-1955   DOA: 03/21/2020   DOS: 03/21/2020   DOS: the patient was seen and examined on 03/21/2020   Patient coming from: The patient is coming from Home  Chief Complaint: Altered mental status, hypoglycemia,  HPI: John Reilly is a 64 y.o. male with Past medical history of HTN, HLD, IDDM T2, Parkinson's disease, Alzheimer's dementia, chronic hyponatremia, as reviewed from EMR, presented at [facility] ED due to altered mental status.  As per patient's wife patient started using new insulin pump and his blood glucoses dropping, 5 episodes in the last 1 week and she is using glucagon to keep blood glucose up.  Today morning patient had low blood glucose and she gave him some glucose, patient's wife went out and by the time she got home around 3 PM she found patient unresponsive laying on the floor and she thought his sugar might be low so she tried to give some Pepsi with syringe but he was unable to swallow and she was not having glucagon to give him at that time so she called EMS and patient was found to have a blood glucose in 40s, patient was given dextrose by EMS and blood glucose improved by the time patient was in the ED blood glucose dropped again so patient was started on D5 IV infusion.  Patient able to provide any history and unable to offer any complaints due to altered mental status, patient is improving he knows his name only and not aware of anything else.  Patient was confused and trying to rip IV so mittens were placed to protect him.   ED Course: Patient was confused and disoriented AAO x1, hypoglycemic blood glucose 46, hyponatremia sodium 129, potassium 3.4, slightly elevated troponin 39 but patient was unable to complain of chest pain CXR: 1. Left basilar consolidation, which could reflect developing atelectasis versus aspiration given clinical presentation. Continued  follow-up recommended. Covid-19 and influenza A/B negative Follow blood cultures   Review of Systems: as mentioned in the history of present illness.  All other systems reviewed and are negative.  Past Medical History:  Diagnosis Date  . Cancer ([place])    Pt reports on 10/02/17 that he has never been dx with cancer  . COPD (chronic obstructive pulmonary disease) ([place])   . Diabetes mellitus without complication ([place])   . GERD (gastroesophageal reflux disease)   . Hypertension   . Multiple duodenal ulcers   . Peripheral vascular disease (HCC)    Peripheral Neuropathy  . Retinopathy    Past Surgical History:  Procedure Laterality Date  . ANKLE ARTHROPLASTY     2013  . COLONOSCOPY WITH PROPOFOL N/A 10/12/2016   Procedure: COLONOSCOPY WITH PROPOFOL;  Surgeon: Manya Silvas, MD;  Location: [facility] ENDOSCOPY;  Service: Endoscopy;  Laterality: N/A;  . INTRAMEDULLARY (IM) NAIL INTERTROCHANTERIC Left 08/08/2018   Procedure: INTRAMEDULLARY (IM) NAIL INTERTROCHANTRIC;  Surgeon: Corky Mull, MD;  Location: [facility] ORS;  Service: Orthopedics;  Laterality: Left;   Social History:  reports that he quit smoking about 14 years ago. He has never used smokeless tobacco. He reports current alcohol use of about 42.0 standard drinks of alcohol per week. He reports that he does not use drugs.  Allergies  Allergen Reactions  . Penicillins Rash and Other (See Comments)    Has patient had a PCN reaction causing immediate rash, facial/tongue/throat swelling, SOB or lightheadedness  with hypotension: Yes Has patient had a PCN reaction causing severe rash involving mucus membranes or skin necrosis: No Has patient had a PCN reaction that required hospitalization: No Has patient had a PCN reaction occurring within the last 10 years: No If all of the above answers are "NO", then may proceed with Cephalosporin use. Other reaction(s): RASH Has patient had a PCN reaction causing immediate rash, facial/tongue/throat  swelling, SOB or lightheadedness with hypotension: Yes Has patient had a PCN reaction causing severe rash involving mucus membranes or skin necrosis: No Has patient had a PCN reaction that required hospitalization: No Has patient had a PCN reaction occurring within the last 10 years: No If all of the above answers are "NO", then may proceed with Cephalosporin use. Reaction: Unknown; childhood reaction  Has patient had a PCN reaction causing immediate rash, facial/tongue/throat swelling, SOB or lightheadedness with hypotension: Yes Has patient had a PCN reaction causing severe rash involving mucus membranes or skin necrosis: No Has patient had a PCN reaction that required hospitalization: No Has patient had a PCN reaction occurring within the last 10 years: No If all of the above answers are "NO", then may proceed with Cephalosporin use.  . Sulfa Antibiotics Rash    Katherina Right Syndrome (SJS)      Family history reviewed and not pertinent Family History  Problem Relation Age of Onset  . Stroke Mother   . Alzheimer's disease Father   . Diabetes Brother      Prior to Admission medications   Medication Sig Start Date End Date Taking? Authorizing Provider  amLODipine (NORVASC) 5 MG tablet Take 5 mg by mouth daily.    Yes [provider]  azelastine (ASTELIN) 0.1 % nasal spray Place 1 spray into both nostrils 2 (two) times daily.  03/06/20 03/06/21 Yes [provider]  benzonatate (TESSALON) 200 MG capsule Take 200 mg by mouth 3 (three) times daily as needed for cough.  03/06/20  Yes [provider]  carbidopa-levodopa (SINEMET CR) 50-200 MG tablet Take 1 tablet by mouth at bedtime.    Yes [provider]  carbidopa-levodopa (SINEMET IR) 25-250 MG tablet Take 1 tablet by mouth 4 (four) times daily.    Yes [provider]  folic acid (FOLVITE) 485 MCG tablet Take 800 mcg by mouth daily.    Yes [provider]  furosemide (LASIX) 40  MG tablet Take 40 mg by mouth daily. 07/26/19  Yes [provider]  Insulin Infusion Pump Supplies (PARADIGM PUMP RESERVOIR 1.8ML) MISC 1 Device as directed.  09/07/18  Yes [provider]  insulin lispro (HUMALOG) 100 UNIT/ML injection Inject 30 Units into the skin as directed. Via insulin pump   Yes [provider]  metoprolol succinate (TOPROL-XL) 50 MG 24 hr tablet Take 50 mg by mouth daily.    Yes [provider]  Multiple Vitamin (MULTIVITAMIN WITH MINERALS) TABS tablet Take 1 tablet by mouth daily.   Yes [provider]  omeprazole (PRILOSEC) 40 MG capsule Take 40 mg by mouth daily.    Yes [provider]  pregabalin (LYRICA) 25 MG capsule Take 25 mg by mouth 2 (two) times daily. 11/29/19  Yes [provider]  rivastigmine (EXELON) 1.5 MG capsule Take 1.5 mg by mouth 2 (two) times daily.  02/21/20 02/20/21 Yes [provider]    Physical Exam: Vitals:   03/21/20 1830 03/21/20 1839 03/21/20 1934 03/21/20 2040  BP: (!) 199/79  (!) 143/83 (!) 144/79  Pulse: Marland Kitchen)  112 (!) 111 (!) 121 (!) 118  Resp: 19 (!) 30 18   Temp:      TempSrc:      SpO2: 99% 98% 100% 96%  Weight:        General: Confused, disoriented, oriented to his name only,  not oriented to time, and place.  Appear in moderate distress Eyes: PERRLA, Conjunctiva normal ENT: Oral Mucosa Clear, dry  Neck: no JVD, no Abnormal Mass Or lumps Cardiovascular: S1 and S2 Present, no Murmur, peripheral pulses symmetrical Respiratory: good respiratory effort, Bilateral Air entry equal, mild Crackles, no wheezes Abdomen: Bowel Sound present, Soft and no tenderness, no hernia Skin: no rashes  Extremities: no Pedal edema, no calf tenderness Neurologic: without any new focal findings, patient was confused and disoriented unable to follow commands, moving extremities spontaneously. Gait not checked due to patient safety concerns  Data Reviewed: I have personally  reviewed and interpreted labs, imaging as discussed below.  CBC: Recent Labs  Lab 03/21/20 1609  WBC 9.3  HGB 12.2*  HCT 37.0*  MCV 93.7  PLT 967   Basic Metabolic Panel: Recent Labs  Lab 03/21/20 1609  NA 129*  K 3.4*  CL 92*  CO2 23  GLUCOSE 46*  BUN 16  CREATININE 1.15  CALCIUM 8.5*   GFR: CrCl cannot be calculated (Unknown ideal weight.). Liver Function Tests: Recent Labs  Lab 03/21/20 1609  AST 20  ALT <5  ALKPHOS 92  BILITOT 0.5  PROT 7.5  ALBUMIN 3.5   No results for input(s): LIPASE, AMYLASE in the last 168 hours. No results for input(s): AMMONIA in the last 168 hours. Coagulation Profile: No results for input(s): INR, PROTIME in the last 168 hours. Cardiac Enzymes: Recent Labs  Lab 03/21/20 1609  CKTOTAL 46*   BNP (last 3 results) No results for input(s): PROBNP in the last 8760 hours. HbA1C: No results for input(s): HGBA1C in the last 72 hours. CBG: Recent Labs  Lab 03/21/20 1552 03/21/20 1617 03/21/20 1815 03/21/20 1846  GLUCAP 76 131* 64* 140*   Lipid Profile: No results for input(s): CHOL, HDL, LDLCALC, TRIG, CHOLHDL, LDLDIRECT in the last 72 hours. Thyroid Function Tests: No results for input(s): TSH, T4TOTAL, FREET4, T3FREE, THYROIDAB in the last 72 hours. Anemia Panel: No results for input(s): VITAMINB12, FOLATE, FERRITIN, TIBC, IRON, RETICCTPCT in the last 72 hours. Urine analysis:    Component Value Date/Time   COLORURINE YELLOW (A) 08/07/2018 1507   APPEARANCEUR HAZY (A) 08/07/2018 1507   LABSPEC 1.017 08/07/2018 1507   PHURINE 5.0 08/07/2018 1507   GLUCOSEU >=500 (A) 08/07/2018 1507   HGBUR LARGE (A) 08/07/2018 1507   BILIRUBINUR NEGATIVE 08/07/2018 1507   KETONESUR 80 (A) 08/07/2018 1507   PROTEINUR 100 (A) 08/07/2018 1507   NITRITE NEGATIVE 08/07/2018 1507   LEUKOCYTESUR NEGATIVE 08/07/2018 1507    Radiological Exams on Admission: DG Chest Portable 1 View  Result Date: 03/21/2020 CLINICAL DATA:  Altered level  of consciousness, rhonchi, unresponsive, hypoglycemia EXAM: PORTABLE CHEST 1 VIEW COMPARISON:  08/07/2018 FINDINGS: Single frontal view of the chest demonstrates an unremarkable cardiac silhouette. Lung volumes are diminished, with likely atelectasis at the left base. Given clinical presentation, superimposed aspiration cannot be excluded. No effusion or pneumothorax. Chronic left humeral neck fracture. IMPRESSION: 1. Left basilar consolidation, which could reflect developing atelectasis versus aspiration given clinical presentation. Continued follow-up recommended. Electronically Signed   By: Randa Ngo M.D.   On: 03/21/2020 16:46    I reviewed all nursing notes, pharmacy  notes, vitals, pertinent old records.  Assessment/Plan  Principal Problem:   AMS (altered mental status) Active Problems:   Hyponatremia   Altered mental status   Type 2 diabetes mellitus with vascular disease (HCC)   Hypoglycemia due to insulin    # Altered mental status, multifactorial could be secondary to underlying infection versus hypoglycemia Patient had a cough for 2 weeks as per patient wife due to cold, patient is vaccinated against Covid and COVID-19 and influenza A and B PCR negative Chest x-ray shows possible aspiration Continue supportive care and continue neurochecks   # Hypoglycemia secondary to new insulin pump During last week 5 episodes of hypoglycemia developed and patient's wife using glucagon which she ran out, might benefit with prescription on discharge Started D5 NS IV fluid Continue Humalog sliding scale Start diabetic diet when patient is alert and oriented Discontinue IV fluid when blood glucose remained stable   # Aspiration pneumonia Patient had cough for the past 2 weeks, Chest x-ray consistent with left lower lobe infiltrates, possible aspiration??  It is less common site for aspiration Continue to monitor WBC count and temperature curve Follow blood cultures Patient was given  antibiotics ceftriaxone and Flagyl which has been continued Patient is allergic to penicillin, so did not start Zosyn De-escalate antibiotics and transition to oral after improvement  # Chronic hyponatremia, sodium 129 on admission Continue D5 NS for now, change IV fluid after improvement of blood glucose Check serum osmolality, urine osmolality and urine electrolytes Start fluid restriction if hypotonic hyponatremia Follow 2D echocardiogram to rule out CHF   # Mildly elevated troponin, could be secondary to fall Continue to trend troponin level Monitor on telemetry Follow 2D echocardiogram   #HTN, HLD Resumed amlodipine, metoprolol  #Parkinson's disease and Alzheimer's dementia   Resumed Sinemet and Exelon    Nutrition: Carb modified diet DVT Prophylaxis: Subcutaneous Lovenox  Advance goals of care discussion: Full code   Consults: None  Family Communication: family was present at bedside, at the time of interview.  Opportunity was given to ask question and all questions were answered satisfactorily.  Disposition: Admitted as inpatient with telemetry on med-surge unit. Likely to be discharged home, in 1-2 days.  I have discussed plan of care as described above with RN and patient/family.  Severity of Illness: The appropriate patient status for this patient is INPATIENT. Inpatient status is judged to be reasonable and necessary in order to provide the required intensity of service to ensure the patient's safety. The patient's presenting symptoms, physical exam findings, and initial radiographic and laboratory data in the context of their chronic comorbidities is felt to place them at high risk for further clinical deterioration. Furthermore, it is not anticipated that the patient will be medically stable for discharge from the hospital within 2 midnights of admission. The following factors support the patient status of inpatient.   " The patient's presenting symptoms include  altered mental status. " The worrisome physical exam findings include altered mental status. " The initial radiographic and laboratory data are worrisome because of hypoglycemia, hyponatremia and possible aspiration pneumonia. " The chronic co-morbidities include IDDM, Parkinson disease and dementia.   * I certify that at the point of admission it is my clinical judgment that the patient will require inpatient hospital care spanning beyond 2 midnights from the point of admission due to high intensity of service, high risk for further deterioration and high frequency of surveillance required.*    Author: Val Riles, MD [facility] 03/21/2020 8:43  PM   To reach On-call, see care teams to locate the attending and reach out to them via [email or web address]. If 7PM-7AM, please contact night-coverage If you still have difficulty reaching the attending provider, please page the [facility] (Director on Call) for [facility] on amion for assistance.

## 2020-03-21 NOTE — ED Notes (Signed)
Assessment completed, pt responds to verbal stimuli, more alert and able to answer and follow simple questions/commands, wife continues at bedside along with sitter.  Pt calms with talking, somewhat agitated, last glucose check in 140's, pt is no longer clammy, skin is warm and dry, has on compression hose bilaterally.   Bladder scan preformed by last shift with 200cc noted in bladder, has on periwick with urine in suction cannister, clear/yellow.  Wife states that she has given multiple glucagon kits over the last 5 days when new pump was placed.

## 2020-03-21 NOTE — ED Notes (Signed)
Report called to floor and given to [facility].

## 2020-03-21 NOTE — ED Notes (Addendum)
Pt presents to ED with c/o of being found unresponsive at scene with a blood sugar of 24. EMS states new insulin about 5 days ago that has been "malfunctioning"since. Per EMS wife had to give glucagon last night due to hypoglycemia. EMS gave D10 and states blood sugar after glucose administration was 124. Pt presents with AMS and is responsive to pain. Insulin pumped removed. Pt presents with a productive cough. Pt ius not able to answer any of this RN's questions at this time. Pt is arousalable to voice and light touch. Pt does keep trying to spit and keeps grabbing at IV lines and cardiac monitor.

## 2020-03-21 NOTE — ED Notes (Signed)
MD Paduchowski notified of pt's cbg being 84. Verbal order to give amp of D50 given.

## 2020-03-21 NOTE — ED Notes (Signed)
Wife at bedside.

## 2020-03-21 NOTE — Progress Notes (Signed)
CODE SEPSIS - PHARMACY COMMUNICATION  **Broad Spectrum Antibiotics should be administered within 1 hour of Sepsis diagnosis**  Time Code Sepsis Called/Page Received: @ 1703  Antibiotics Ordered: Ceftriaxone, Flagyl  Time of 1st antibiotic administration: @ [place], PharmD, BCPS Clinical Pharmacist 03/21/2020 5:45 PM

## 2020-03-21 NOTE — ED Notes (Signed)
Pt currently trying to take pull at IV's and cardiac monitor. Pt is agitated and not redirectable at this time, MD aware, new orders, see MAR. Soft mitts in place at this time, CMS intact.

## 2020-03-22 ENCOUNTER — Inpatient Hospital Stay (HOSPITAL_COMMUNITY)
Admit: 2020-03-22 | Discharge: 2020-03-22 | Disposition: A | Discharge status: Home / Self Care | Payer: Medicare Other | Attending: Student | Admitting: Student

## 2020-03-22 ENCOUNTER — Inpatient Hospital Stay: Payer: Medicare Other

## 2020-03-22 DIAGNOSIS — R4182 Altered mental status, unspecified: Secondary | ICD-10-CM | POA: Diagnosis not present

## 2020-03-22 DIAGNOSIS — R778 Other specified abnormalities of plasma proteins: Secondary | ICD-10-CM

## 2020-03-22 DIAGNOSIS — R7989 Other specified abnormal findings of blood chemistry: Secondary | ICD-10-CM

## 2020-03-22 DIAGNOSIS — E1159 Type 2 diabetes mellitus with other circulatory complications: Secondary | ICD-10-CM

## 2020-03-22 DIAGNOSIS — T383X5A Adverse effect of insulin and oral hypoglycemic [antidiabetic] drugs, initial encounter: Secondary | ICD-10-CM

## 2020-03-22 DIAGNOSIS — E871 Hypo-osmolality and hyponatremia: Secondary | ICD-10-CM | POA: Diagnosis not present

## 2020-03-22 DIAGNOSIS — E160 Drug-induced hypoglycemia without coma: Secondary | ICD-10-CM | POA: Diagnosis not present

## 2020-03-22 DIAGNOSIS — I340 Nonrheumatic mitral (valve) insufficiency: Secondary | ICD-10-CM | POA: Diagnosis not present

## 2020-03-22 LAB — GLUCOSE, CAPILLARY
Glucose-Capillary: 138 mg/dL — ABNORMAL HIGH (ref 70–99)
Glucose-Capillary: 206 mg/dL — ABNORMAL HIGH (ref 70–99)
Glucose-Capillary: 200 mg/dL — ABNORMAL HIGH (ref 70–99)
Glucose-Capillary: 222 mg/dL — ABNORMAL HIGH (ref 70–99)
Glucose-Capillary: 113 mg/dL — ABNORMAL HIGH (ref 70–99)

## 2020-03-22 LAB — COMPREHENSIVE METABOLIC PANEL
ALT: 10 U/L (ref 0–44)
AST: 19 U/L (ref 15–41)
Albumin: 2.8 g/dL — ABNORMAL LOW (ref 3.5–5.0)
Alkaline Phosphatase: 66 U/L (ref 38–126)
Anion gap: 7 (ref 5–15)
BUN: 15 mg/dL (ref 8–23)
CO2: 24 mmol/L (ref 22–32)
Calcium: 7.5 mg/dL — ABNORMAL LOW (ref 8.9–10.3)
Chloride: 98 mmol/L (ref 98–111)
Creatinine, Ser: 1 mg/dL (ref 0.61–1.24)
GFR, Estimated: >60 mL/min (ref >60)
Glucose, Bld: 123 mg/dL — ABNORMAL HIGH (ref 70–99)
Potassium: 3.2 mmol/L — ABNORMAL LOW (ref 3.5–5.1)
Sodium: 129 mmol/L — ABNORMAL LOW (ref 135–145)
Total Bilirubin: 0.8 mg/dL (ref 0.3–1.2)
Total Protein: 6 g/dL — ABNORMAL LOW (ref 6.5–8.1)

## 2020-03-22 LAB — CBC
HCT: 31.6 % — ABNORMAL LOW (ref 39.0–52.0)
Hemoglobin: 10.7 g/dL — ABNORMAL LOW (ref 13.0–17.0)
MCH: 31.6 pg (ref 26.0–34.0)
MCHC: 33.9 g/dL (ref 30.0–36.0)
MCV: 93.2 fL (ref 80.0–100.0)
Platelets: 320 10*3/uL (ref 150–400)
RBC: 3.39 MIL/uL — ABNORMAL LOW (ref 4.22–5.81)
RDW: 12.8 % (ref 11.5–15.5)
WBC: 9.4 10*3/uL (ref 4.0–10.5)
nRBC: 0 % (ref 0.0–0.2)

## 2020-03-22 LAB — URINALYSIS, COMPLETE (UACMP) WITH MICROSCOPIC
Bacteria, UA: NONE SEEN
Bilirubin Urine: NEGATIVE
Glucose, UA: >=500 mg/dL — AB
Hgb urine dipstick: NEGATIVE
Ketones, ur: 20 mg/dL — AB
Nitrite: NEGATIVE
Protein, ur: 100 mg/dL — AB
Specific Gravity, Urine: 1.013 (ref 1.005–1.030)
Squamous Epithelial / HPF: NONE SEEN (ref 0–5)
pH: 5 (ref 5.0–8.0)

## 2020-03-22 LAB — NA AND K (SODIUM & POTASSIUM), RAND UR
Potassium Urine: 25 mmol/L
Sodium, Ur: 33 mmol/L

## 2020-03-22 LAB — VITAMIN B12: Vitamin B-12: 311 pg/mL (ref 180–914)

## 2020-03-22 LAB — TROPONIN I (HIGH SENSITIVITY): Troponin I (High Sensitivity): 59 ng/L — ABNORMAL HIGH (ref <18)

## 2020-03-22 LAB — MAGNESIUM: Magnesium: 1.4 mg/dL — ABNORMAL LOW (ref 1.7–2.4)

## 2020-03-22 LAB — AMMONIA: Ammonia: 19 umol/L (ref 9–35)

## 2020-03-22 LAB — HEMOGLOBIN A1C
Hgb A1c MFr Bld: 6.6 % — ABNORMAL HIGH (ref 4.8–5.6)
Mean Plasma Glucose: 142.72 mg/dL

## 2020-03-22 LAB — TSH: TSH: 2.449 u[IU]/mL (ref 0.350–4.500)

## 2020-03-22 LAB — FOLATE: Folate: 21.4 ng/mL (ref >5.9)

## 2020-03-22 LAB — LACTIC ACID, PLASMA: Lactic Acid, Venous: 1.1 mmol/L (ref 0.5–1.9)

## 2020-03-22 LAB — PROCALCITONIN: Procalcitonin: 3.22 ng/mL

## 2020-03-22 MED ORDER — INSULIN ASPART 100 UNIT/ML ~~LOC~~ SOLN
0.0000 [IU] | SUBCUTANEOUS | Status: DC
  Administered 2020-03-22: 1 [IU] via SUBCUTANEOUS
  Administered 2020-03-22: 2 [IU] via SUBCUTANEOUS
  Administered 2020-03-23 (×2): 3 [IU] via SUBCUTANEOUS
  Administered 2020-03-23: 1 [IU] via SUBCUTANEOUS
  Administered 2020-03-23 (×2): 3 [IU] via SUBCUTANEOUS
  Administered 2020-03-23: 1 [IU] via SUBCUTANEOUS
  Administered 2020-03-24: 2 [IU] via SUBCUTANEOUS
  Administered 2020-03-24: 3 [IU] via SUBCUTANEOUS
  Administered 2020-03-24: 2 [IU] via SUBCUTANEOUS
  Filled 2020-03-22 (×10): qty 1

## 2020-03-22 MED ORDER — INSULIN ASPART 100 UNIT/ML ~~LOC~~ SOLN
3.0000 [IU] | Freq: Three times a day (TID) | SUBCUTANEOUS | Status: DC
  Administered 2020-03-22 – 2020-03-23 (×2): 3 [IU] via SUBCUTANEOUS
  Filled 2020-03-22 (×2): qty 1

## 2020-03-22 MED ORDER — INFLUENZA VAC A&B SA ADJ QUAD 0.5 ML IM PRSY
0.5000 mL | PREFILLED_SYRINGE | INTRAMUSCULAR | Status: AC
  Administered 2020-03-24: 0.5 mL via INTRAMUSCULAR
  Filled 2020-03-22: qty 0.5

## 2020-03-22 MED ORDER — PERFLUTREN LIPID MICROSPHERE
1.0000 mL | INTRAVENOUS | Status: AC | PRN
  Administered 2020-03-22: 2 mL via INTRAVENOUS
  Filled 2020-03-22: qty 10

## 2020-03-22 MED ORDER — INSULIN ASPART 100 UNIT/ML ~~LOC~~ SOLN: 0.0000 [IU] | Freq: Every day | SUBCUTANEOUS | Status: DC

## 2020-03-22 MED ORDER — POTASSIUM CHLORIDE IN NACL 20-0.9 MEQ/L-% IV SOLN
INTRAVENOUS | Status: DC
  Filled 2020-03-22 (×4): qty 1000

## 2020-03-22 MED ORDER — INSULIN ASPART 100 UNIT/ML ~~LOC~~ SOLN: 0.0000 [IU] | Freq: Three times a day (TID) | SUBCUTANEOUS | Status: DC

## 2020-03-22 MED ORDER — INSULIN DETEMIR 100 UNIT/ML ~~LOC~~ SOLN
6.0000 [IU] | Freq: Every day | SUBCUTANEOUS | Status: DC
  Administered 2020-03-22: 6 [IU] via SUBCUTANEOUS
  Filled 2020-03-22 (×2): qty 0.06

## 2020-03-22 MED ORDER — INSULIN DETEMIR 100 UNIT/ML ~~LOC~~ SOLN
6.0000 [IU] | Freq: Two times a day (BID) | SUBCUTANEOUS | Status: DC
  Filled 2020-03-22 (×2): qty 0.06

## 2020-03-22 NOTE — Evaluation (Addendum)
Clinical/Bedside Swallow Evaluation Patient Details  Name: John Reilly MRN: [id number] Date of Birth: 01-25-1956  Today's Date: 03/22/2020 Time: SLP Start Time (ACUTE ONLY): 1250 SLP Stop Time (ACUTE ONLY): 1350 SLP Time Calculation (min) (ACUTE ONLY): 60 min  Past Medical History:  Past Medical History:  Diagnosis Date  . Cancer ([place])    Pt reports on 10/02/17 that he has never been dx with cancer  . COPD (chronic obstructive pulmonary disease) ([place])   . Diabetes mellitus without complication ([place])   . GERD (gastroesophageal reflux disease)   . Hypertension   . Multiple duodenal ulcers   . Peripheral vascular disease ([facility])    Peripheral Neuropathy  . Retinopathy    Past Surgical History:  Past Surgical History:  Procedure Laterality Date  . ANKLE ARTHROPLASTY     2013  . COLONOSCOPY WITH PROPOFOL N/A 10/12/2016   Procedure: COLONOSCOPY WITH PROPOFOL;  Surgeon: Manya Silvas, MD;  Location: [facility] ENDOSCOPY;  Service: Endoscopy;  Laterality: N/A;  . INTRAMEDULLARY (IM) NAIL INTERTROCHANTERIC Left 08/08/2018   Procedure: INTRAMEDULLARY (IM) NAIL INTERTROCHANTRIC;  Surgeon: Corky Mull, MD;  Location: [facility] ORS;  Service: Orthopedics;  Laterality: Left;   HPI:  Pt is a 64 y.o. male with Past medical history of GERD, COPD, HTN, HLD, IDDM T2, Parkinson's disease, Alzheimer's dementia, chronic hyponatremia, as reviewed from EMR, presented at [facility] ED due to altered mental status.  As per patient's wife patient started using new insulin pump and his blood glucoses dropping, 5 episodes in the last 1 week and she is using glucagon to keep blood glucose up.  Today morning patient had low blood glucose and she gave him some glucose, patient's wife went out and by the time she got home around 3 PM she found patient unresponsive laying on the floor and she thought his sugar might be low so she tried to give some Pepsi with syringe but he was unable to swallow and she was not having glucagon to  give him at that time so she called EMS and patient was found to have a blood glucose in 40s, patient was given dextrose by EMS and blood glucose improved by the time patient was in the ED blood glucose dropped again so patient was started on D5 IV infusion and brought to the hospital.  CXR: Left basilar consolidation, which could reflect developing atelectasis versus aspiration given clinical presentation.  Recent CT of Abd: Severe pancreatic parenchymal atrophy with diffuse calcifications.  Current Na this evaluation: 129.   Assessment / Plan / Recommendation Clinical Impression  Pt appears to present w/ adequate oropharyngeal phase swallow w/ No oropharyngeal phase dysphagia noted, No neuromuscular deficits noted impacting the oropharyngeal swallow. Pt does have a baseline Dementia; Parkinson's Disease. Any Neurological disease process can impact the swallow function. Pt consumed po trials at this beside evaluation w/ No overt, clinical s/s of aspiration during po trials. Pt appears at reduced risk for aspiration following general aspiration precautions.  During this eval, pt required verbal cues and assistance in positioning upright in bed for po trials. He fed self w/ setup support; monitoring of boluses given. W/ po trials, pt consumed all consistencies w/ no overt coughing, decline in vocal quality, or change in respiratory presentation during/post trials. Oral phase appeared [facility] w/ timely bolus management, mastication, and control of bolus propulsion for A-P transfer for swallowing. Oral clearing achieved w/ all trial consistencies. OM Exam appeared [facility] w/ no unilateral weakness noted. Speech Clear.  Recommend a more  mech soft consistency diet w/ well-Cut meats, moistened foods; Thin liquidsVIA CUP - pt does typically not use straws at home; Cup better for oral control when drinking. Recommend general aspiration precautions, Pills Crushed in Puree for safer, easier swallowing. GERD precautions.  Education given on Pills in Puree; food consistencies and easy to eat options; general aspiration precautions. NSG to reconsult if any new needs arise. NSG agreed. Wife agreed stating pt seemed "like himself" w/ eating/drinking today "versus yesterday". Encouraged support for pt at meals as needed d/t baseline Dementia; Parkinson's Disease. Also educated Wife on risk for aspiration and Pulmonary decline if attempting to give po's ("Pepsi via a Syringe" as was stated in the H&P) when he is so out of it --- but instead, call 911. Wife agreed. SLP Visit Diagnosis: Dysphagia, unspecified (R13.10)    Aspiration Risk   (reduced)    Diet Recommendation  Mech Soft diet consistency w/ gravies to moisten; Thin liquids VIA CUP. General aspiration precautions; GERD precautions; Support at meals.  Medication Administration: Crushed with puree (for safer swallowing)    Other  Recommendations Recommended Consults:  (Dietician f/u) Oral Care Recommendations: Oral care BID;Oral care before and after PO;Staff/trained caregiver to provide oral care Other Recommendations:  (n/a)   Follow up Recommendations None      Frequency and Duration  (n/a)   (n/a)       Prognosis Prognosis for Safe Diet Advancement: Good Barriers to Reach Goals: Cognitive deficits;Time post onset;Severity of deficits (baseline Dementia; Parkinson's Dis.)      Swallow Study   General Date of Onset: 03/21/20 HPI: Pt is a 64 y.o. male with Past medical history of GERD, COPD, HTN, HLD, IDDM T2, Parkinson's disease, Alzheimer's dementia, chronic hyponatremia, as reviewed from EMR, presented at [facility] ED due to altered mental status.  As per patient's wife patient started using new insulin pump and his blood glucoses dropping, 5 episodes in the last 1 week and she is using glucagon to keep blood glucose up.  Today morning patient had low blood glucose and she gave him some glucose, patient's wife went out and by the time she got home around 3  PM she found patient unresponsive laying on the floor and she thought his sugar might be low so she tried to give some Pepsi with syringe but he was unable to swallow and she was not having glucagon to give him at that time so she called EMS and patient was found to have a blood glucose in 40s, patient was given dextrose by EMS and blood glucose improved by the time patient was in the ED blood glucose dropped again so patient was started on D5 IV infusion and brought to the hospital.  CXR: Left basilar consolidation, which could reflect developing atelectasis versus aspiration given clinical presentation.  Recent CT of Abd: Severe pancreatic parenchymal atrophy with diffuse calcifications.  Current Na this evaluation: 129. Type of Study: Bedside Swallow Evaluation Previous Swallow Assessment: none Diet Prior to this Study: Regular;Thin liquids Temperature Spikes Noted: No (wbc 9.4) Respiratory Status: Room air History of Recent Intubation: No Behavior/Cognition: Alert;Cooperative;Pleasant mood;Distractible;Requires cueing (Baseline of Dementia and Parkinson's Dis per chart) Oral Cavity Assessment: Within Functional Limits Oral Care Completed by SLP: Yes Oral Cavity - Dentition: Adequate natural dentition Vision: Functional for self-feeding Self-Feeding Abilities: Able to feed self;Needs set up Patient Positioning: Upright in bed (needed positioning) Baseline Vocal Quality: Normal Volitional Cough: Strong Volitional Swallow: Able to elicit    Oral/Motor/Sensory Function Overall Oral Motor/Sensory  Function: Within functional limits   Ice Chips Ice chips: Within functional limits Presentation: Spoon (fed; 3 trials)   Thin Liquid Thin Liquid: Within functional limits Presentation: Cup;Self Fed (10+ trials)    Nectar Thick Nectar Thick Liquid: Not tested   Honey Thick Honey Thick Liquid: Not tested   Puree Puree: Within functional limits Presentation: Self Fed;Spoon (~3+ ozs)   Solid      Solid: Within functional limits Presentation: Self Fed;Spoon (10+ trials ) Other Comments: softened, moist foods         Orinda Kenner, MS, Development worker, international aid Language Pathologist [facility] [phone] Benn Tarver 03/22/2020,2:39 PM

## 2020-03-22 NOTE — Progress Notes (Signed)
PROGRESS NOTE    John Reilly  ZOX:[id number] DOB: 10-May-1956 DOA: 03/21/2020 PCP: Adin Hector, MD   Brief Narrative:  Patient is 64 year old male with past medical history of hypertension, hyperlipidemia, insulin-dependent diabetes mellitus, Parkinson's disease, Alzheimer's dementia, chronic hyponatremia presents to emergency department with altered mental status.  Patient started using new insulin pump and his blood glucose dropped multiple times in last 1 week.  She has been using glucagon to keep his blood glucose up.  Patient was found unresponsive by his spouse at home therefore she called EMS and brought patient to the ER for AMS likely in the setting of hypoglycemia.  Blood glucose was found to be in 40s by EMS-was given dextrose.  In ED: Patient blood glucose dropped again therefore patient started on D5 IV infusion.  Patient was found to be confused and disoriented.  Slightly elevated troponin, chest x-ray shows left basilar consolidation reflect atelectasis versus aspiration.  COVID-19 and influenza panel: Negative.  Blood culture obtained.  Patient started on broad-spectrum antibiotics and admitted for further evaluation and management of. Possible aspiration pneumonia.  Assessment & Plan:  Acute metabolic encephalopathy:  -Likely in the setting of hypoglycemia secondary to recent start of insulin pump at home vs electrolyte imbalance. -Hold insulin pump.  Replace electrolytes -Consult PT/OT/SLP. -We will keep him n.p.o.  Neurochecks. -Check UA, B12, folate, TSH, ammonia -Aspiration/fall precautions  Hypoglycemia: In the setting of new insulin pump -History of insulin-dependent diabetes mellitus type 2/severe chronic pancreatitis. -Blood sugar is improving. -Discussed with diabetes coordinator-start on NovoLog very sensitive sliding scale insulin TID, NovoLog meal coverage 3 units 3 times daily with meals, Lantus 6 units daily.   -Continue to monitor blood sugar  closely.  Sepsis in the setting of pneumonia-possible aspiration?: -Patient met sepsis criteria.  Presented with fever of 100.9, tachycardia, tachypnea with elevated lactic acid.  Patient was given Rocephin and Flagyl in ED given Hx of penicillin allergy.  Reviewed chest x-ray. -Continue Rocephin.  Stop Flagyl. -Keep him n.p.o. until he passes bedside swallow evaluation. -On aspiration precautions.  Follow blood culture.  Check procalcitonin level.  Trend lactic acid.  Left arm swelling: Noted on exam this morning. -Doppler ultrasound of left upper extremity came back negative for DVT.  Remove IV line.  Warm compression.  Elevate arm.  Mildly elevated troponin: Likely demand ischemia in the setting of infection/hypoglycemia.  Patient denies ACS symptoms.  Reviewed EKG.  Troponin trending down. -Echo is pending.  Hypokalemia: Replenished.  Check magnesium level.  Hyponatremia: Sodium 129 upon admission. -Serum osmolality, urine osmolality: WNL, urine electrolytes: Pending. Replace Na slowly.   Hypertension: -Home meds-amlodipine and metoprolol once able to take p.o.  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 -Continue with PT/OT.    DVT prophylaxis: Lovenox Code Status: Full code Family Communication:  None present at bedside.  Plan of care discussed with patient in length and he verbalized understanding and agreed with it.  Discussed plan of care with patient's wife at bedside and she verbalized understanding.  Disposition Plan: Home  Consultants:   None  Procedures:  CT abdomen/pelvis Echo Antimicrobials:   Rocephin  Flagyl  Status is: Inpatient  Dispo: The patient is from: home                 Anticipated d/c is to: home/snf?  Anticipated d/c  date is: >2 days              Patient currently not medically stable for the discharge.   Subjective: Patient seen and examined.  Awake but very confused.  Not oriented to time place and person.  Moving all extremities equally.  Objective: Vitals:   03/22/20 0000 03/22/20 0405 03/22/20 0928 03/22/20 1207  BP:  136/60 (!) 156/70 (!) 145/63  Pulse:  71 (!) 54 68  Resp:  _0 Temp:  98.4 F (36.9 C) 98.2 F (36.8 C) 98 F (36.7 C)  TempSrc:  Oral Oral Oral  SpO2:  97% 98% 99%  Weight: 77.3 kg     Height: _1  (1.753 m)       Intake/Output Summary (Last 24 hours) at 03/22/2020 1247 Last data filed at 03/22/2020 0400 Gross per 24 hour  Intake 520.44 ml  Output 500 ml  Net 20.44 ml   Filed Weights   03/21/20 1557 03/22/20 0000  Weight: 86.2 kg 77.3 kg    Examination:  General exam: Appears very dehydrated, on room air, awake but very confused.  Lethargic. Respiratory system: Clear to auscultation. Respiratory effort normal. Cardiovascular system: S1 & S2 heard, RRR. No JVD, murmurs, rubs, gallops or clicks. No pedal edema. Gastrointestinal system: Abdomen is nondistended, soft and nontender. No organomegaly or masses felt. Normal bowel sounds heard. Central nervous system: Awake but very confused and not oriented to time place and person.   Extremities: Moves all extremities equally.  Left arm swollen with multiple fluid-filled blisters noted.   Skin: No rashes, lesions or ulcers   Data Reviewed: I have personally reviewed following labs and imaging studies  CBC: Recent Labs  Lab 03/21/20 1609 03/22/20 0122  WBC 9.3 9.4  HGB 12.2* 10.7*  HCT 37.0* 31.6*  MCV 93.7 93.2  PLT 370 673   Basic Metabolic Panel: Recent Labs  Lab 03/21/20 1609 03/22/20 0122  NA 129* 129*  K 3.4* 3.2*  CL 92* 98  CO2 23 24  GLUCOSE 46* 123*  BUN 16 15  CREATININE 1.15 1.00  CALCIUM 8.5* 7.5*   GFR: Estimated Creatinine Clearance: 73.6 mL/min (by C-G formula based on  SCr of 1 mg/dL). Liver Function Tests: Recent Labs  Lab 03/21/20 1609 03/22/20 0122  AST 20 19  ALT <5 10  ALKPHOS 92 66  BILITOT 0.5 0.8  PROT 7.5 6.0*  ALBUMIN 3.5 2.8*   No results for input(s): LIPASE, AMYLASE in the last 168 hours. No results for input(s): AMMONIA in the last 168 hours. Coagulation Profile: No results for input(s): INR, PROTIME in the last 168 hours. Cardiac Enzymes: Recent Labs  Lab 03/21/20 1609  CKTOTAL 46*   BNP (last 3 results) No results for input(s): PROBNP in the last 8760 hours. HbA1C: Recent Labs    03/21/20 2220  HGBA1C 6.6*   CBG: Recent Labs  Lab 03/21/20 2133 03/21/20 2346 03/22/20 0328 03/22/20 0728 03/22/20 1218  GLUCAP 257* 159* 138* 206* 200*   Lipid Profile: No results for input(s): CHOL, HDL, LDLCALC, TRIG, CHOLHDL, LDLDIRECT in the last 72 hours. Thyroid Function Tests: No results for input(s): TSH, T4TOTAL, FREET4, T3FREE, THYROIDAB in the last 72 hours. Anemia Panel: No results for input(s): VITAMINB12, FOLATE, FERRITIN, TIBC, IRON, RETICCTPCT in the last 72 hours. Sepsis Labs: Recent Labs  Lab 03/21/20 1610 03/21/20 2220 03/22/20 0122  LATICACIDVEN 4.4* 0.7 1.1    Recent Results (from the past 240 hour(s))  Blood  culture (routine x 2)     Status: None (Preliminary result)   Collection Time: 03/21/20  4:10 PM   Specimen: BLOOD  Result Value Ref Range Status   Specimen Description BLOOD LEFT ANTECUBITAL  Final   Special Requests   Final    BOTTLES DRAWN AEROBIC AND ANAEROBIC Blood Culture adequate volume   Culture   Final    NO GROWTH < 24 HOURS Performed at [facility], [address] [place], [place] [address]    Report Status PENDING  Incomplete  Respiratory Panel by RT PCR (Flu A&B, Covid) - Nasopharyngeal Swab     Status: None   Collection Time: 03/21/20  4:10 PM   Specimen: Nasopharyngeal Swab  Result Value Ref Range Status   SARS Coronavirus 2 by RT PCR NEGATIVE NEGATIVE Final     Comment: (NOTE) SARS-CoV-2 target nucleic acids are NOT DETECTED.  The SARS-CoV-2 RNA is generally detectable in upper respiratoy specimens during the acute phase of infection. The lowest concentration of SARS-CoV-2 viral copies this assay can detect is 131 copies/mL. A negative result does not preclude SARS-Cov-2 infection and should not be used as the sole basis for treatment or other patient management decisions. A negative result may occur with  improper specimen collection/handling, submission of specimen other than nasopharyngeal swab, presence of viral mutation(s) within the areas targeted by this assay, and inadequate number of viral copies (<131 copies/mL). A negative result must be combined with clinical observations, patient history, and epidemiological information. The expected result is Negative.  Fact Sheet for Patients:  PinkCheek.be  Fact Sheet for Healthcare Providers:  GravelBags.it  This test is no t yet approved or cleared by the [place] FDA and  has been authorized for detection and/or diagnosis of SARS-CoV-2 by FDA under an Emergency Use Authorization (EUA). This EUA will remain  in effect (meaning this test can be used) for the duration of the COVID-19 declaration under Section 564(b)(1) of the Act, 21 U.S.C. section 360bbb-3(b)(1), unless the authorization is terminated or revoked sooner.     Influenza A by PCR NEGATIVE NEGATIVE Final   Influenza B by PCR NEGATIVE NEGATIVE Final    Comment: (NOTE) The Xpert Xpress SARS-CoV-2/FLU/RSV assay is intended as an aid in  the diagnosis of influenza from Nasopharyngeal swab specimens and  should not be used as a sole basis for treatment. Nasal washings and  aspirates are unacceptable for Xpert Xpress SARS-CoV-2/FLU/RSV  testing.  Fact Sheet for Patients: PinkCheek.be  Fact Sheet for Healthcare  Providers: GravelBags.it  This test is not yet approved or cleared by the [place] FDA and  has been authorized for detection and/or diagnosis of SARS-CoV-2 by  FDA under an Emergency Use Authorization (EUA). This EUA will remain  in effect (meaning this test can be used) for the duration of the  Covid-19 declaration under Section 564(b)(1) of the Act, 21  U.S.C. section 360bbb-3(b)(1), unless the authorization is  terminated or revoked. Performed at [facility], [address], [place], [place] [address]       Radiology Studies: US Venous Img Upper Uni Left (DVT)  Result Date: 03/22/2020 CLINICAL DATA:  Left upper extremity swelling. EXAM: LEFT UPPER EXTREMITY VENOUS DOPPLER ULTRASOUND TECHNIQUE: Gray-scale sonography with graded compression, as well as color Doppler and duplex ultrasound were performed to evaluate the upper extremity deep venous system from the level of the subclavian vein and including the jugular, axillary, basilic, radial, ulnar and upper cephalic vein. Spectral Doppler was utilized  to evaluate flow at rest and with distal augmentation maneuvers. COMPARISON:  None. FINDINGS: Contralateral Subclavian Vein: Respiratory phasicity is normal and symmetric with the symptomatic side. No evidence of thrombus. Normal compressibility. Internal Jugular Vein: No evidence of thrombus. Normal compressibility, respiratory phasicity and response to augmentation. Subclavian Vein: No evidence of thrombus. Normal compressibility, respiratory phasicity and response to augmentation. Axillary Vein: No evidence of thrombus. Normal compressibility, respiratory phasicity and response to augmentation. Cephalic Vein: No evidence of thrombus. Normal compressibility, respiratory phasicity and response to augmentation. Basilic Vein: No evidence of thrombus. Normal compressibility, respiratory phasicity and response to augmentation. Brachial Veins: No evidence  of thrombus. Normal compressibility, respiratory phasicity and response to augmentation. Radial Veins: No evidence of thrombus. Normal compressibility, respiratory phasicity and response to augmentation. Ulnar Veins: No evidence of thrombus. Normal compressibility, respiratory phasicity and response to augmentation. Venous Reflux:  None visualized. Other Findings:  Mild subcutaneous edema is noted. IMPRESSION: No evidence of DVT within the left upper extremity. Electronically Signed   By: Inez Catalina M.D.   On: 03/22/2020 12:08   DG Chest Portable 1 View  Result Date: 03/21/2020 CLINICAL DATA:  Altered level of consciousness, rhonchi, unresponsive, hypoglycemia EXAM: PORTABLE CHEST 1 VIEW COMPARISON:  08/07/2018 FINDINGS: Single frontal view of the chest demonstrates an unremarkable cardiac silhouette. Lung volumes are diminished, with likely atelectasis at the left base. Given clinical presentation, superimposed aspiration cannot be excluded. No effusion or pneumothorax. Chronic left humeral neck fracture. IMPRESSION: 1. Left basilar consolidation, which could reflect developing atelectasis versus aspiration given clinical presentation. Continued follow-up recommended. Electronically Signed   By: Randa Ngo M.D.   On: 03/21/2020 16:46    Scheduled Meds: . amLODipine  5 mg Oral Daily  . carbidopa-levodopa  1 tablet Oral QHS  . carbidopa-levodopa  1 tablet Oral QID  . enoxaparin (LOVENOX) injection  40 mg Subcutaneous Q24H  . guaiFENesin  600 mg Oral BID  . insulin aspart  0-6 Units Subcutaneous Q4H  . insulin aspart  3 Units Subcutaneous TID WC  . insulin detemir  6 Units Subcutaneous Daily  . metoprolol succinate  50 mg Oral Daily  . pantoprazole  40 mg Oral Daily  . pregabalin  25 mg Oral BID  . rivastigmine  1.5 mg Oral BID   Continuous Infusions: . sodium chloride 100 mL/hr at 03/21/20 2212  . cefTRIAXone (ROCEPHIN)  IV 1 g (03/22/20 1046)     LOS: 1 day   Time spent: 40  minutes  Mariellen Blaney Loann Quill, MD [facility]  If 7PM-7AM, please contact night-coverage [email or web address] 03/22/2020, 12:47 PM

## 2020-03-22 NOTE — Progress Notes (Addendum)
Inpatient Diabetes Program Recommendations  AACE/ADA: New Consensus Statement on Inpatient Glycemic Control (2015)  Target Ranges:  Prepandial:   less than 140 mg/dL      Peak postprandial:   less than 180 mg/dL (1-2 hours)      Critically ill patients:  140 - 180 mg/dL   Lab Results  Component Value Date   GLUCAP 206 (H) 03/22/2020   HGBA1C 6.6 (H) 03/21/2020    Review of Glycemic Control Results for John Reilly, John Reilly (MRN [id number]) as of 03/22/2020 11:19  Ref. Range 03/21/2020 15:52 03/21/2020 16:17 03/21/2020 18:15 03/21/2020 18:46 03/21/2020 21:33 03/21/2020 23:46 03/22/2020 03:28 03/22/2020 07:28  Glucose-Capillary Latest Ref Range: 70 - 99 mg/dL 76 131 (H) 64 (L) 140 (H) 257 (H) 159 (H) 138 (H) 206 (H)   Diabetes history: DM 1 since 1988 Outpatient Diabetes medications: T-Slim insulin pump ( just recently started in the past month) Current orders for Inpatient glycemic control:  Novolog moderate q 4 hours  Inpatient Diabetes Program Recommendations:    Spoke at length to patient's wife regarding hypoglycemia and start of insulin pump.  She states that patient recently started new insulin pump- T-Slim in the past few weeks.  There were issues intially getting a refill on the Dexcom sensors- so patient went a few days without sensor.  She states that he finally received the sensors and has been having lots of lows recently.  She states that she ran out of the Glucose gel that she used to use for his low blood sugars and so she called to get a prescription for Glucagon refilled.  She states that when she got home yesterday patient was "out of it".  She called [facility] to see if she could pick up Glucagon but found out it was 250$ so she called 911.  Reinforced with wife that she did the right thing to call 911 and told her I was concerned regarding the hypoglycemia that the patient is having with insulin pump.  She states that she did call Dr. Hal Morales office to inform them of lows and  she was instructed to bring patient in this morning if patient was not admitted to the hospital.  I told her that I would call Dr. Hal Morales office to let them know that patient is in the hospital.    My current recommendation is to NOT restart insulin pump.  Consider Lantus 12 units daily, Novolog very sensitive (0-6 units) tid with meals and HS, and Novolog meal coverage 3 units tid with meals (hold if patient eats less than 50%).   Notified Dr. Doristine Bosworth.   Will follow.   Thanks  Adah Perl, RN, BC-ADM Inpatient Diabetes Coordinator Pager [phone] (8a-5p)  Addendum: Discussed with Dr. Doristine Bosworth.  Orders received for Levemir 6 units daily, Novolog very sensitive q 4 hours, Novolog 3 units tid with meals (hold if patient eats less than 50% or NPO).

## 2020-03-22 NOTE — Evaluation (Signed)
Physical Therapy Evaluation Patient Details Name: DELONTA YOHANNES MRN: [id number] DOB: 1956/03/28 Today's Date: 03/22/2020   History of Present Illness  Sergei SKIP LITKE is a 64 y.o. male with Past medical history of HTN, HLD, IDDM T2, Parkinson's disease, Alzheimer's dementia, chronic hyponatremia, as reviewed from EMR, presented at [facility] ED due to altered mental status. 11/11 around 3 PM she found patient unresponsive laying on the floor she called EMS and patient was found to have a blood glucose in 40s.  Clinical Impression  Pt admitted with above diagnosis. Pt currently with functional limitations due to the deficits listed below (see "PT Problem List"). Upon entry, pt in bed, awake and agreeable to participate, OT evaluation recently ended. Wife at bedside provides details on history as needed. The pt is alert and oriented x3, pleasant, minimally engaging. Pt has some flat affect and some slight delayed processing. Supervision provided for bed mobility and tranfers for line safety, minGuard assist provided for safety while up. Pt has some mild unsteadiness once up, but only one complete LOB. Pt AMB around unit with minGuardA, no LOB in gait, but unsteadiness is obvious. Pt is fatigued and asks to return to room. Balance deficits seems to be heavily rooted in delayed onset awareness of sway, explained to wife, as a RW alone at DC will be insufficient to full meet pt's safety needs. Wife is agreeable. Functional mobility assessment demonstrates increased effort/time requirements, fair tolerance, but only minimal intermittent need for physical assistance, whereas the patient performed these at a higher level of independence PTA. Pt will benefit from skilled PT intervention to increase independence and safety with basic mobility in preparation for discharge to the venue listed below.       Follow Up Recommendations Home health PT;Supervision for mobility/OOB    Equipment Recommendations  None recommended  by PT    Recommendations for Other Services       Precautions / Restrictions Precautions Precautions: Fall Restrictions Weight Bearing Restrictions: No      Mobility  Bed Mobility Overal bed mobility: Needs Assistance Bed Mobility: Supine to Sit;Sit to Supine     Supine to sit: Supervision Sit to supine: Supervision   General bed mobility comments: poor line/lead safety awareness.    Transfers Overall transfer level: Needs assistance Equipment used: None Transfers: Sit to/from Stand Sit to Stand: Supervision;Min guard        Lateral/Scoot Transfers: Min guard General transfer comment: stays up for 45-60sec, but posterior LOB when donning mask, lands onto bed.  Ambulation/Gait Ambulation/Gait assistance: Min guard Gait Distance (Feet): 190 Feet Assistive device: None Gait Pattern/deviations: WFL(Within Functional Limits) Gait velocity: 0.73m/s   General Gait Details: minimal trunk sway (generally rigid), moving fairly well, pt reports fatigue after 1 lap around unit.  Stairs            Wheelchair Mobility    Modified Rankin (Stroke Patients Only)       Balance Overall balance assessment: Needs assistance;History of Falls Sitting-balance support: No upper extremity supported;Feet supported Sitting balance-[name]: Good     Standing balance support: Single extremity supported;During functional activity Standing balance-[name]: Fair Standing balance comment: 1 fall posterior onto bed; minimal unsteadiness whilst AMB, but no LOB.                             Pertinent Vitals/Pain Pain Assessment: No/denies pain Faces Pain Scale: Hurts little more Pain Location: back c mobility Pain Descriptors /  Indicators: Dull;Discomfort Pain Intervention(s): Limited activity within patient's tolerance;Repositioned    Home Living Family/patient expects to be discharged to:: Private residence Living Arrangements: Spouse/significant other  (17yo grandson;) Available Help at Discharge: Family;Available 24 hours/day Type of Home: House Home Access: Stairs to enter Entrance Stairs-Rails: Left Entrance Stairs-Number of Steps: 1 Home Layout: One level Home Equipment: Walker - 2 wheels;Cane - single point;Bedside commode;Shower seat Additional Comments: lives c wife and 14 yo grandson     Prior Function Level of Independence: Independent;Needs assistance   Gait / Transfers Assistance Needed: community AMB without device, still drives.     Comments: Wife reports pt was Independent w/o AD including driving and I/ADLs     Hand Dominance   Dominant Hand: Right    Extremity/Trunk Assessment   Upper Extremity Assessment Upper Extremity Assessment: Generalized weakness;Overall [facility] for tasks assessed    Lower Extremity Assessment Lower Extremity Assessment: Generalized weakness;Overall [facility] for tasks assessed    Cervical / Trunk Assessment Cervical / Trunk Assessment: Normal  Communication   Communication: No difficulties  Cognition Arousal/Alertness: Awake/alert Behavior During Therapy: Flat affect Overall Cognitive Status: Impaired/Different from baseline Area of Impairment: Orientation;Memory;Following commands;Problem solving;Safety/judgement                 Orientation Level: Disoriented to;Place;Situation   Memory: Decreased recall of precautions;Decreased short-term memory Following Commands: Follows one step commands consistently;Follows one step commands with increased time Safety/Judgement: Decreased awareness of safety;Decreased awareness of deficits   Problem Solving: Slow processing General Comments: per wife close to baseline, but seems more tired than usual      General Comments      Exercises Other Exercises Other Exercises: Pt and family educated re: OT role, DME recs, d/c recs, falls prevention, re-orientation to place/situation Other Exercises: LBD, sup<>sit, sit<>stand,  sitting/standing balance/tolerance, lateral scoot   Assessment/Plan    PT Assessment Patient needs continued PT services  PT Problem List Decreased mobility;Decreased balance;Decreased activity tolerance;Decreased strength;Decreased cognition;Decreased safety awareness;Decreased knowledge of precautions       PT Treatment Interventions DME instruction;Gait training;Balance training;Stair training;Functional mobility training;Therapeutic activities;Therapeutic exercise;Patient/family education;Neuromuscular re-education;Cognitive remediation    PT Goals (Current goals can be found in the Care Plan section)  Acute Rehab PT Goals Patient Stated Goal: restroe mobility to PLOF/baseline PT Goal Formulation: With patient Time For Goal Achievement: 04/05/20 Potential to Achieve Goals: Good    Frequency Min 2X/week   Barriers to discharge        Co-evaluation               AM-PAC PT "6 Clicks" Mobility  Outcome Measure Help needed turning from your back to your side while in a flat bed without using bedrails?: A Little Help needed moving from lying on your back to sitting on the side of a flat bed without using bedrails?: A Little Help needed moving to and from a bed to a chair (including a wheelchair)?: A Little Help needed standing up from a chair using your arms (e.g., wheelchair or bedside chair)?: A Little Help needed to walk in hospital room?: A Little Help needed climbing 3-5 steps with a railing? : A Little 6 Click Score: 18    End of Session Equipment Utilized During Treatment: Gait belt Activity Tolerance: Patient tolerated treatment well;No increased pain Patient left: in bed;with family/visitor present;with nursing/sitter in room;Other (comment) (echo tech) Nurse Communication: Mobility status PT Visit Diagnosis: Difficulty in walking, not elsewhere classified (R26.2);Muscle weakness (generalized) (M62.81);Other abnormalities of gait and mobility (R26.89)  Time:  4301-4840 PT Time Calculation (min) (ACUTE ONLY): 22 min   Charges:   PT Evaluation $PT Eval Moderate Complexity: 1 Mod          4:30 PM, 03/22/20 Etta Grandchild, PT, DPT Physical Therapist - [facility]  [phone] ([place])   Sharisa Toves C 03/22/2020, 4:24 PM

## 2020-03-22 NOTE — Evaluation (Signed)
Occupational Therapy Evaluation Patient Details Name: John Reilly MRN: [id number] DOB: May 26, 1955 Today's Date: 03/22/2020    History of Present Illness John Reilly is a 64 y.o. male with Past medical history of HTN, HLD, IDDM T2, Parkinson's disease, Alzheimer's dementia, chronic hyponatremia, as reviewed from EMR, presented at [facility] ED due to altered mental status. 11/11 around 3 PM she found patient unresponsive laying on the floor she called EMS and patient was found to have a blood glucose in 40s.   Clinical Impression   John Reilly was seen for OT evaluation this date. Prior to hospital admission, pt was Independent in mobility and ADLs. Pt lives with wife and 69yo grandson and has 24/7 assist per wife. Pt presents to acute OT demonstrating impaired ADL performance and functional mobility 2/2 decreased safety awareness, functional balance/ROM deficits, and decreased activity tolerance. Pt currently requires MIN A sup<>sit - assist for sequencing BLE. MAX A for LBD seated EOB. MIN A for BSC t/f - assist for balance and sequencing. SETUP + SBA seated grooming tasks. Pt would benefit from skilled OT to address noted impairments and functional limitations (see below for any additional details) in order to maximize safety and independence while minimizing falls risk and caregiver burden. Upon hospital discharge, recommend HHOT to maximize pt safety and return to functional independence during meaningful occupations of daily life.     Follow Up Recommendations  Home health OT;Supervision/Assistance - 24 hour    Equipment Recommendations  None recommended by OT    Recommendations for Other Services       Precautions / Restrictions Precautions Precautions: Fall;Other (comment) (aspiration, seizure) Restrictions Weight Bearing Restrictions: No      Mobility Bed Mobility Overal bed mobility: Needs Assistance Bed Mobility: Supine to Sit;Sit to Supine     Supine to sit: Min assist Sit  to supine: Min assist   General bed mobility comments: Assist for BLE and sequencing    Transfers Overall transfer level: Needs assistance Equipment used: 1 person hand held assist Transfers: Sit to/from Stand;Lateral/Scoot Transfers Sit to Stand: Min assist        Lateral/Scoot Transfers: Min guard General transfer comment: assist for balance    Balance Overall balance assessment: Needs assistance Sitting-balance support: Single extremity supported;No upper extremity supported Sitting balance-John Reilly: Fair     Standing balance support: Single extremity supported Standing balance-John Reilly: Poor          ADL either performed or assessed with clinical judgement   ADL Overall ADL's : Needs assistance/impaired       General ADL Comments: MAX A for LBD seated EOB. MIN A for BSC t/f - assist for balance and sequencing. Anticipate MOD A standing grooming tasks - assist for balance                  Pertinent Vitals/Pain Pain Assessment: Faces Faces Pain Reilly: Hurts little more Pain Location: back c mobility Pain Descriptors / Indicators: Dull;Discomfort Pain Intervention(s): Limited activity within patient's tolerance;Repositioned     Hand Dominance Right   Extremity/Trunk Assessment Upper Extremity Assessment Upper Extremity Assessment: Generalized weakness   Lower Extremity Assessment Lower Extremity Assessment: Generalized weakness       Communication Communication Communication: No difficulties   Cognition Arousal/Alertness: Awake/alert Behavior During Therapy: Flat affect Overall Cognitive Status: Impaired/Different from baseline Area of Impairment: Orientation;Memory;Following commands;Problem solving;Safety/judgement                 Orientation Level: Disoriented to;Place;Situation   Memory:  Decreased recall of precautions;Decreased short-term memory Following Commands: Follows one step commands consistently;Follows one step  commands with increased time Safety/Judgement: Decreased awareness of safety;Decreased awareness of deficits   Problem Solving: Slow processing;Requires verbal cues;Requires tactile cues     General Comments       Exercises Exercises: Other exercises Other Exercises Other Exercises: Pt and family educated re: OT role, DME recs, d/c recs, falls prevention, re-orientation to place/situation Other Exercises: LBD, sup<>sit, sit<>stand, sitting/standing balance/tolerance, lateral scoot   Shoulder Instructions      Home Living Family/patient expects to be discharged to:: Private residence Living Arrangements: Spouse/significant other Available Help at Discharge: Family;Available 24 hours/day Type of Home: House Home Access: Stairs to enter [employer] of Steps: 1 Entrance Stairs-Rails: Left Home Layout: One level     Bathroom Shower/Tub: Walk-in shower         Home Equipment: Environmental consultant - 2 wheels;Cane - single point;Bedside commode;Shower seat   Additional Comments: lives c wife and 92 yo grandson       Prior Functioning/Environment Level of Independence: Independent        Comments: Wife reports pt was Independent w/o AD including driving and I/ADLs        OT Problem List: Decreased strength;Decreased range of motion;Decreased activity tolerance;Impaired balance (sitting and/or standing);Decreased cognition;Decreased safety awareness      OT Treatment/Interventions: Self-care/ADL training;Therapeutic exercise;Energy conservation;DME and/or AE instruction;Therapeutic activities;Patient/family education;Balance training    OT Goals(Current goals can be found in the care plan section) Acute Rehab OT Goals Patient Stated Goal: To return home OT Goal Formulation: With patient/family Time For Goal Achievement: 04/05/20 Potential to Achieve Goals: Good ADL Goals Pt Will Perform Grooming: with supervision;standing (c LRAD PRN) Pt Will Perform Lower Body Dressing:  with supervision;with caregiver independent in assisting;sit to/from stand (c LRAD PRN) Pt Will Transfer to Toilet: with modified independence;ambulating;bedside commode (c LRAD PRN)  OT Frequency: Min 1X/week    AM-PAC OT "6 Clicks" Daily Activity     Outcome Measure Help from another person eating meals?: None Help from another person taking care of personal grooming?: A Little Help from another person toileting, which includes using toliet, bedpan, or urinal?: A Little Help from another person bathing (including washing, rinsing, drying)?: A Lot Help from another person to put on and taking off regular upper body clothing?: A Little Help from another person to put on and taking off regular lower body clothing?: A Lot 6 Click Score: 17   End of Session Nurse Communication: Mobility status  Activity Tolerance: Patient limited by fatigue Patient left: in bed;with call bell/phone within reach;with bed alarm set;with family/visitor present  OT Visit Diagnosis: Other abnormalities of gait and mobility (R26.89)                Time: 7628-3151 OT Time Calculation (min): 20 min Charges:  OT General Charges $OT Visit: 1 Visit OT Evaluation $OT Eval Moderate Complexity: 1 Mod OT Treatments $Self Care/Home Management : 8-22 mins  Dessie Coma, M.S. OTR/L  03/22/20, 3:45 PM  ascom [phone]

## 2020-03-22 NOTE — Progress Notes (Signed)
*  PRELIMINARY RESULTS* Echocardiogram 2D Echocardiogram has been performed.  John Reilly John Reilly 03/22/2020, 4:54 PM

## 2020-03-22 NOTE — Progress Notes (Signed)
Unable to complete some of the admission questions as pt's mental status is not appropriate currently. Will hand over to day RN to complete remaining questions when pt's spouse is available.

## 2020-03-22 NOTE — Progress Notes (Signed)
   03/21/20 2117  Assess: MEWS Score  Temp (!) 100.9 F (38.3 C) (rectal tylenol given)  BP (!) 159/70  Pulse Rate (!) 108  Resp 20  SpO2 94 %  O2 Device Room Air  Assess: MEWS Score  MEWS Temp 1  MEWS Systolic 0  MEWS Pulse 1  MEWS RR 0  MEWS LOC 0  MEWS Score 2  MEWS Score Color Yellow  Assess: if the MEWS score is Yellow or Red  Were vital signs taken at a resting state? Yes  Focused Assessment No change from prior assessment  Early Detection of Sepsis Score *See Row Information* High  MEWS guidelines implemented *See Row Information* No, previously yellow, continue vital signs every 4 hours   MEWS Yellow for mild tachycardia and slight fever. Will give prn Tylenol rectally. NP informed. NO additional interventions or extra monitoring for now as pt was previously yellow/red during the day. Will continue to monitor.

## 2020-03-22 NOTE — Progress Notes (Signed)
Spoke with wife again by phone.  Unable to reach endocrinologist by phone but left a message.  She states that she has left a message as well.  Patient does have Dexcom sensor on at this time however does not have reader or phone App.  Recommend that both she and her husband get the App for the Dexcom sensor so that they can use alarm function for low blood sugars.  This would also give her the ability to check on his blood sugars even when they are away from each other.  Explained that patient would not be discharged home with insulin pump and would need close follow-up with Dr. Honor Junes. Also explained importance of avoiding low blood sugars and goals of 100-180 mg/dL at this time.   Thanks,  Adah Perl, RN, BC-ADM Inpatient Diabetes Coordinator Pager [phone] (8a-5p)

## 2020-03-23 ENCOUNTER — Inpatient Hospital Stay: Payer: Medicare Other

## 2020-03-23 DIAGNOSIS — E871 Hypo-osmolality and hyponatremia: Secondary | ICD-10-CM | POA: Diagnosis not present

## 2020-03-23 DIAGNOSIS — E160 Drug-induced hypoglycemia without coma: Secondary | ICD-10-CM | POA: Diagnosis not present

## 2020-03-23 DIAGNOSIS — E1159 Type 2 diabetes mellitus with other circulatory complications: Secondary | ICD-10-CM | POA: Diagnosis not present

## 2020-03-23 DIAGNOSIS — R4182 Altered mental status, unspecified: Secondary | ICD-10-CM | POA: Diagnosis not present

## 2020-03-23 LAB — GLUCOSE, CAPILLARY
Glucose-Capillary: 173 mg/dL — ABNORMAL HIGH (ref 70–99)
Glucose-Capillary: 187 mg/dL — ABNORMAL HIGH (ref 70–99)
Glucose-Capillary: 24 mg/dL — CL (ref 70–99)
Glucose-Capillary: 285 mg/dL — ABNORMAL HIGH (ref 70–99)
Glucose-Capillary: 290 mg/dL — ABNORMAL HIGH (ref 70–99)
Glucose-Capillary: 297 mg/dL — ABNORMAL HIGH (ref 70–99)
Glucose-Capillary: 300 mg/dL — ABNORMAL HIGH (ref 70–99)
Glucose-Capillary: 55 mg/dL — ABNORMAL LOW (ref 70–99)
Glucose-Capillary: 94 mg/dL (ref 70–99)

## 2020-03-23 LAB — COMPREHENSIVE METABOLIC PANEL
ALT: 5 U/L (ref 0–44)
AST: 12 U/L — ABNORMAL LOW (ref 15–41)
Albumin: 2.3 g/dL — ABNORMAL LOW (ref 3.5–5.0)
Alkaline Phosphatase: 60 U/L (ref 38–126)
Anion gap: 7 (ref 5–15)
BUN: 17 mg/dL (ref 8–23)
CO2: 22 mmol/L (ref 22–32)
Calcium: 7.6 mg/dL — ABNORMAL LOW (ref 8.9–10.3)
Chloride: 104 mmol/L (ref 98–111)
Creatinine, Ser: 1.1 mg/dL (ref 0.61–1.24)
GFR, Estimated: >60 mL/min (ref >60)
Glucose, Bld: 308 mg/dL — ABNORMAL HIGH (ref 70–99)
Potassium: 4.1 mmol/L (ref 3.5–5.1)
Sodium: 133 mmol/L — ABNORMAL LOW (ref 135–145)
Total Bilirubin: 0.8 mg/dL (ref 0.3–1.2)
Total Protein: 5.3 g/dL — ABNORMAL LOW (ref 6.5–8.1)

## 2020-03-23 LAB — ECHOCARDIOGRAM COMPLETE
AR max vel: 3.23 cm2
AV Area VTI: 3.16 cm2
AV Area mean vel: 3.29 cm2
AV Mean grad: 4 mmHg
AV Peak grad: 7.6 mmHg
Ao pk vel: 1.38 m/s
Area-P 1/2: 6.17 cm2
Height: 69 in
S' Lateral: 2.82 cm
Weight: 2726.65 oz

## 2020-03-23 LAB — CBC
HCT: 28.1 % — ABNORMAL LOW (ref 39.0–52.0)
Hemoglobin: 9.5 g/dL — ABNORMAL LOW (ref 13.0–17.0)
MCH: 31.7 pg (ref 26.0–34.0)
MCHC: 33.8 g/dL (ref 30.0–36.0)
MCV: 93.7 fL (ref 80.0–100.0)
Platelets: 282 10*3/uL (ref 150–400)
RBC: 3 MIL/uL — ABNORMAL LOW (ref 4.22–5.81)
RDW: 13.1 % (ref 11.5–15.5)
WBC: 13.7 10*3/uL — ABNORMAL HIGH (ref 4.0–10.5)
nRBC: 0 % (ref 0.0–0.2)

## 2020-03-23 LAB — PROCALCITONIN: Procalcitonin: 2.55 ng/mL

## 2020-03-23 MED ORDER — MAGNESIUM SULFATE 2 GM/50ML IV SOLN
2.0000 g | Freq: Once | INTRAVENOUS | Status: AC
  Administered 2020-03-23: 2 g via INTRAVENOUS
  Filled 2020-03-23: qty 50

## 2020-03-23 NOTE — Plan of Care (Signed)
  Problem: Education: Goal: Knowledge of General Education information will improve Description: Including pain rating scale, medication(s)/side effects and non-pharmacologic comfort measures Outcome: Progressing   Problem: Health Behavior/Discharge Planning: Goal: Ability to manage health-related needs will improve Outcome: Progressing   Problem: Clinical Measurements: Goal: Ability to maintain clinical measurements within normal limits will improve Outcome: Progressing   Problem: Activity: Goal: Risk for activity intolerance will decrease Outcome: Progressing   Problem: Elimination: Goal: Will not experience complications related to urinary retention Outcome: Progressing   Problem: Skin Integrity: Goal: Risk for impaired skin integrity will decrease Outcome: Progressing

## 2020-03-23 NOTE — Progress Notes (Signed)
Pt's BG-24 at 2350; 8 ounces of OJ administered at that time per protocol; BG rechecked at 0014 for 55; 8 ounces of OJ administered again.  BG rechecked at [place] for 94.  Pt reports that he did not feel his BG dropping and remained asymptomatic the entire time.  Pt administered a protein shake after BG reached 94.  Will continue to monitor and update as needed.    On-call provider informed of hypoglycemia.

## 2020-03-23 NOTE — Progress Notes (Signed)
Inpatient Diabetes Program Recommendations  AACE/ADA: New Consensus Statement on Inpatient Glycemic Control (2015)  Target Ranges:  Prepandial:   less than 140 mg/dL      Peak postprandial:   less than 180 mg/dL (1-2 hours)      Critically ill patients:  140 - 180 mg/dL   Lab Results  Component Value Date   GLUCAP 187 (H) 03/23/2020   HGBA1C 6.6 (H) 03/21/2020    Review of Glycemic Control Results for KENZIE, FLAKES (MRN [id number]) as of 03/23/2020 08:11  Ref. Range 03/22/2020 16:53 03/22/2020 20:24 03/22/2020 23:50 03/23/2020 00:14 03/23/2020 00:36 03/23/2020 04:37 03/23/2020 07:35  Glucose-Capillary Latest Ref Range: 70 - 99 mg/dL 222 (H) Novolog 5 units 113 (H) 24 (LL) 55 (L) 94 290 (H) 187 (H)   Diabetes history: DM 1 since 1988 Outpatient Diabetes medications: T-Slim insulin pump ( just recently started in the past month) Current orders for Inpatient glycemic control: Levemir 6 units qd + Novolog 3 units tid meal coverage tid + Novolog sensitive q 4 hours  Inpatient Diabetes Program Recommendations:   Consider: -Decrease Novolog correction to 0-6 units tid + hs 0-5 units  Discussed with Dr. Doristine Bosworth. Thank you, Nani Gasser. Porchia Sinkler, RN, MSN, CDE  Diabetes Coordinator Inpatient Glycemic Control Team Team Pager [phone] (8am-5pm) 03/23/2020 8:59 AM

## 2020-03-23 NOTE — Progress Notes (Signed)
PROGRESS NOTE    John Reilly  ZOX:[id number] DOB: Mar 22, 1956 DOA: 03/21/2020 PCP: Adin Hector, MD   Brief Narrative:  Patient is 64 year old male with past medical history of hypertension, hyperlipidemia, insulin-dependent diabetes mellitus, Parkinson's disease, Alzheimer's dementia, chronic hyponatremia presents to emergency department with altered mental status.  Patient started using new insulin pump and his blood glucose dropped multiple times in last 1 week.  She has been using glucagon to keep his blood glucose up.  Patient was found unresponsive by his spouse at home therefore she called EMS and brought patient to the ER for AMS likely in the setting of hypoglycemia.  Blood glucose was found to be in 40s by EMS-was given dextrose.  In ED: Patient blood glucose dropped again therefore patient started on D5 IV infusion.  Patient was found to be confused and disoriented.  Slightly elevated troponin, chest x-ray shows left basilar consolidation reflect atelectasis versus aspiration.  COVID-19 and influenza panel: Negative.  Blood culture obtained.  Patient started on broad-spectrum antibiotics and admitted for further evaluation and management of. Possible aspiration pneumonia.  Assessment & Plan:  Acute metabolic encephalopathy: Patient's mentation has improved -Likely in the setting of hypoglycemia secondary to recent start of insulin pump at home vs electrolyte imbalance. -Hold insulin pump.  Replace electrolytes -Consult PT/OT -Passed bedside swallow evaluation. -UA shows small leukocytes.  Negative for nitrites. B12, folate, TSH, ammonia level: WNL.  Check urine culture. -Aspiration/fall precautions   Hypoglycemia: In the setting of new insulin pump -History of insulin-dependent diabetes mellitus type 2/severe chronic pancreatitis. -Had multiple episodes of hypoglycemia overnight. -We will DC Levemir, NovoLog meal coverage.  Continue very sensitive sliding scale insulin.   Avoid strict glycemic control since his A1c 6.6%. -I would recommend to not to start patient on insulin pump after the discharge considering multiple episodes of hypoglycemia with underlying history of Parkinson's/Alzheimer's dementia.  I would just recommend diet control. -Follow-up with endocrinologist outpatient.  Sepsis in the setting of pneumonia-possible aspiration?: -Patient met sepsis criteria on admission.  Presented with fever of 100.9, tachycardia, tachypnea with elevated lactic acid.  Patient was given Rocephin and Flagyl in ED given Hx of penicillin allergy.  Reviewed chest x-ray. -Continue Rocephin -PCT: Improving from 3.22-2.55, has leukocytosis of WBC 13.7.  Blood culture: Negative so far. -Repeat chest x-ray  Left arm swelling: Improving -Doppler ultrasound of left upper extremity came back negative for DVT.   -Continue warm compression.  Elevate arm.  Mildly elevated troponin: Likely demand ischemia in the setting of infection/hypoglycemia.  Patient denies ACS symptoms.  Reviewed EKG.  Troponin trending down. -Echo is still pending.  Hypokalemia: Replenished.    Hypomagnesemia: Replenished.  Repeat magnesium level tomorrow AM.  Hyponatremia: Sodium improved from 1 29-1 33 this morning. -Serum osmolality, urine osmolality: WNL, urine electrolytes: WNL. -Continue to monitor.  Hypertension: -Blood pressure is slightly elevated this morning.  Will resume patient home meds-amlodipine and metoprolol.  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 -Continue with PT/OT.    DVT prophylaxis: Lovenox Code Status: Full code Family Communication:  None present at bedside.  Plan of care discussed with patient in length and he verbalized understanding and  agreed with it.  Discussed plan of care with patient's wife at bedside and she verbalized understanding.  Disposition Plan: Home  Consultants:   None  Procedures:  CT abdomen/pelvis Echo Antimicrobials:   Rocephin  Flagyl  Status is: Inpatient  Dispo: The patient is from: home                 Anticipated d/c is to: home/snf?              Anticipated d/c date is: >2 days              Patient currently not medically stable for the discharge.   Subjective: Patient seen and examined.  Sitting comfortably on the bed.  Tells me that he feels much better.  Alert and oriented x3.  Asking if he can go home today.  Objective: Vitals:   03/22/20 2339 03/22/20 2349 03/23/20 0516 03/23/20 0736  BP: (!) 131/57 (!) 128/49 (!) 154/68 (!) 158/70  Pulse: 74 74 (!) 59 (!) 54  Resp: '18 16 20 15  ' Temp: 97.7 F (36.5 C) 97.9 F (36.6 C) 98.1 F (36.7 C) 98 F (36.7 C)  TempSrc: Oral Oral Oral Axillary  SpO2: 97% 96% 97% 100%  Weight:      Height:        Intake/Output Summary (Last 24 hours) at 03/23/2020 0952 Last data filed at 03/23/2020 0855 Gross per 24 hour  Intake 2380.94 ml  Output 1050 ml  Net 1330.94 ml   Filed Weights   03/21/20 1557 03/22/20 0000  Weight: 86.2 kg 77.3 kg    Examination:  General exam: Appears calm and comfortable, on room air, communicating well, alert and oriented x3.  Resting tremor noted in right hand. Respiratory system: Clear to auscultation. Respiratory effort normal. Cardiovascular system: S1 & S2 heard, RRR. No JVD, murmurs, rubs, gallops or clicks. No pedal edema. Gastrointestinal system: Abdomen is nondistended, soft and nontender. No organomegaly or masses felt. Normal bowel sounds heard. Central nervous system: Alert and oriented. No focal neurological deficits. Extremities: Right arm swollen-nonerythematous.  Improving from yesterday.  Symmetric 5 x 5 power. Skin: No rashes, lesions or ulcers. Psychiatry: Judgement and insight  appear normal. Mood & affect appropriate.   Data Reviewed: I have personally reviewed following labs and imaging studies  CBC: Recent Labs  Lab 03/21/20 1609 03/22/20 0122 03/23/20 0504  WBC 9.3 9.4 13.7*  HGB 12.2* 10.7* 9.5*  HCT 37.0* 31.6* 28.1*  MCV 93.7 93.2 93.7  PLT 370 320 016   Basic Metabolic Panel: Recent Labs  Lab 03/21/20 1609 03/22/20 0122 03/22/20 1342 03/23/20 0504  NA 129* 129*  --  133*  K 3.4* 3.2*  --  4.1  CL 92* 98  --  104  CO2 23 24  --  22  GLUCOSE 46* 123*  --  308*  BUN 16 15  --  17  CREATININE 1.15 1.00  --  1.10  CALCIUM 8.5* 7.5*  --  7.6*  MG  --   --  1.4*  --    GFR: Estimated Creatinine Clearance: 67 mL/min (by C-G formula based on SCr of 1.1 mg/dL). Liver Function Tests: Recent Labs  Lab 03/21/20 1609 03/22/20 0122 03/23/20 0504  AST 20 19 12*  ALT <'5 10 5  ' ALKPHOS 92 66 60  BILITOT 0.5 0.8 0.8  PROT 7.5 6.0* 5.3*  ALBUMIN 3.5 2.8* 2.3*   No results for input(s): LIPASE, AMYLASE in the last 168 hours. Recent Labs  Lab 03/22/20 1342  AMMONIA 19   Coagulation Profile: No results for input(s): INR, PROTIME in the last 168 hours.  Cardiac Enzymes: Recent Labs  Lab 03/21/20 1609  CKTOTAL 46*   BNP (last 3 results) No results for input(s): PROBNP in the last 8760 hours. HbA1C: Recent Labs    03/21/20 2220  HGBA1C 6.6*   CBG: Recent Labs  Lab 03/22/20 2350 03/23/20 0014 03/23/20 0036 03/23/20 0437 03/23/20 0735  GLUCAP 24* 55* 94 290* 187*   Lipid Profile: No results for input(s): CHOL, HDL, LDLCALC, TRIG, CHOLHDL, LDLDIRECT in the last 72 hours. Thyroid Function Tests: Recent Labs    03/22/20 1342  TSH 2.449   Anemia Panel: Recent Labs    03/22/20 1342  VITAMINB12 311  FOLATE 21.4   Sepsis Labs: Recent Labs  Lab 03/21/20 1610 03/21/20 2220 03/22/20 0122 03/22/20 1342 03/23/20 0504  PROCALCITON  --   --   --  3.22 2.55  LATICACIDVEN 4.4* 0.7 1.1  --   --     Recent Results (from  the past 240 hour(s))  Blood culture (routine x 2)     Status: None (Preliminary result)   Collection Time: 03/21/20  4:10 PM   Specimen: BLOOD  Result Value Ref Range Status   Specimen Description BLOOD LEFT ANTECUBITAL  Final   Special Requests   Final    BOTTLES DRAWN AEROBIC AND ANAEROBIC Blood Culture adequate volume   Culture   Final    NO GROWTH 2 DAYS Performed at [facility], [address] [place], [place] [address]    Report Status PENDING  Incomplete  Respiratory Panel by RT PCR (Flu A&B, Covid) - Nasopharyngeal Swab     Status: None   Collection Time: 03/21/20  4:10 PM   Specimen: Nasopharyngeal Swab  Result Value Ref Range Status   SARS Coronavirus 2 by RT PCR NEGATIVE NEGATIVE Final    Comment: (NOTE) SARS-CoV-2 target nucleic acids are NOT DETECTED.  The SARS-CoV-2 RNA is generally detectable in upper respiratoy specimens during the acute phase of infection. The lowest concentration of SARS-CoV-2 viral copies this assay can detect is 131 copies/mL. A negative result does not preclude SARS-Cov-2 infection and should not be used as the sole basis for treatment or other patient management decisions. A negative result may occur with  improper specimen collection/handling, submission of specimen other than nasopharyngeal swab, presence of viral mutation(s) within the areas targeted by this assay, and inadequate number of viral copies (<131 copies/mL). A negative result must be combined with clinical observations, patient history, and epidemiological information. The expected result is Negative.  Fact Sheet for Patients:  PinkCheek.be  Fact Sheet for Healthcare Providers:  GravelBags.it  This test is no t yet approved or cleared by the [place] FDA and  has been authorized for detection and/or diagnosis of SARS-CoV-2 by FDA under an Emergency Use Authorization (EUA). This EUA will remain  in  effect (meaning this test can be used) for the duration of the COVID-19 declaration under Section 564(b)(1) of the Act, 21 U.S.C. section 360bbb-3(b)(1), unless the authorization is terminated or revoked sooner.     Influenza A by PCR NEGATIVE NEGATIVE Final   Influenza B by PCR NEGATIVE NEGATIVE Final    Comment: (NOTE) The Xpert Xpress SARS-CoV-2/FLU/RSV assay is intended as an aid in  the diagnosis of influenza from Nasopharyngeal swab specimens and  should not be used as a sole basis for treatment. Nasal washings and  aspirates are unacceptable for Xpert Xpress SARS-CoV-2/FLU/RSV  testing.  Fact Sheet for Patients: PinkCheek.be  Fact Sheet for Healthcare Providers: GravelBags.it  This  test is not yet approved or cleared by the [place] and  has been authorized for detection and/or diagnosis of SARS-CoV-2 by  FDA under an Emergency Use Authorization (EUA). This EUA will remain  in effect (meaning this test can be used) for the duration of the  Covid-19 declaration under Section 564(b)(1) of the Act, 21  U.S.C. section 360bbb-3(b)(1), unless the authorization is  terminated or revoked. Performed at [facility], [address], [place], [place] [address]       Radiology Studies: US Venous Img Upper Uni Left (DVT)  Result Date: 03/22/2020 CLINICAL DATA:  Left upper extremity swelling. EXAM: LEFT UPPER EXTREMITY VENOUS DOPPLER ULTRASOUND TECHNIQUE: Gray-scale sonography with graded compression, as well as color Doppler and duplex ultrasound were performed to evaluate the upper extremity deep venous system from the level of the subclavian vein and including the jugular, axillary, basilic, radial, ulnar and upper cephalic vein. Spectral Doppler was utilized to evaluate flow at rest and with distal augmentation maneuvers. COMPARISON:  None. FINDINGS: Contralateral Subclavian Vein: Respiratory phasicity is  normal and symmetric with the symptomatic side. No evidence of thrombus. Normal compressibility. Internal Jugular Vein: No evidence of thrombus. Normal compressibility, respiratory phasicity and response to augmentation. Subclavian Vein: No evidence of thrombus. Normal compressibility, respiratory phasicity and response to augmentation. Axillary Vein: No evidence of thrombus. Normal compressibility, respiratory phasicity and response to augmentation. Cephalic Vein: No evidence of thrombus. Normal compressibility, respiratory phasicity and response to augmentation. Basilic Vein: No evidence of thrombus. Normal compressibility, respiratory phasicity and response to augmentation. Brachial Veins: No evidence of thrombus. Normal compressibility, respiratory phasicity and response to augmentation. Radial Veins: No evidence of thrombus. Normal compressibility, respiratory phasicity and response to augmentation. Ulnar Veins: No evidence of thrombus. Normal compressibility, respiratory phasicity and response to augmentation. Venous Reflux:  None visualized. Other Findings:  Mild subcutaneous edema is noted. IMPRESSION: No evidence of DVT within the left upper extremity. Electronically Signed   By: Inez Catalina M.D.   On: 03/22/2020 12:08   DG Chest Portable 1 View  Result Date: 03/21/2020 CLINICAL DATA:  Altered level of consciousness, rhonchi, unresponsive, hypoglycemia EXAM: PORTABLE CHEST 1 VIEW COMPARISON:  08/07/2018 FINDINGS: Single frontal view of the chest demonstrates an unremarkable cardiac silhouette. Lung volumes are diminished, with likely atelectasis at the left base. Given clinical presentation, superimposed aspiration cannot be excluded. No effusion or pneumothorax. Chronic left humeral neck fracture. IMPRESSION: 1. Left basilar consolidation, which could reflect developing atelectasis versus aspiration given clinical presentation. Continued follow-up recommended. Electronically Signed   By: Randa Ngo M.D.   On: 03/21/2020 16:46    Scheduled Meds: . amLODipine  5 mg Oral Daily  . carbidopa-levodopa  1 tablet Oral QHS  . carbidopa-levodopa  1 tablet Oral QID  . enoxaparin (LOVENOX) injection  40 mg Subcutaneous Q24H  . guaiFENesin  600 mg Oral BID  . influenza vaccine adjuvanted  0.5 mL Intramuscular Tomorrow-1000  . insulin aspart  0-6 Units Subcutaneous Q4H  . metoprolol succinate  50 mg Oral Daily  . pantoprazole  40 mg Oral Daily  . pregabalin  25 mg Oral BID  . rivastigmine  1.5 mg Oral BID   Continuous Infusions: . 0.9 % NaCl with KCl 20 mEq / L 100 mL/hr at 03/23/20 0007  . cefTRIAXone (ROCEPHIN)  IV Stopped (03/22/20 1116)     LOS: 2 days   Time spent: 40 minutes  Jetson Pickrel Loann Quill, MD [facility]  If 7PM-7AM, please  contact night-coverage [email or web address] 03/23/2020, 9:52 AM

## 2020-03-24 DIAGNOSIS — R4182 Altered mental status, unspecified: Secondary | ICD-10-CM | POA: Diagnosis not present

## 2020-03-24 DIAGNOSIS — E1159 Type 2 diabetes mellitus with other circulatory complications: Secondary | ICD-10-CM | POA: Diagnosis not present

## 2020-03-24 DIAGNOSIS — E160 Drug-induced hypoglycemia without coma: Secondary | ICD-10-CM | POA: Diagnosis not present

## 2020-03-24 DIAGNOSIS — E871 Hypo-osmolality and hyponatremia: Secondary | ICD-10-CM | POA: Diagnosis not present

## 2020-03-24 LAB — PROCALCITONIN: Procalcitonin: 1.34 ng/mL

## 2020-03-24 LAB — BASIC METABOLIC PANEL
Anion gap: 8 (ref 5–15)
BUN: 11 mg/dL (ref 8–23)
CO2: 21 mmol/L — ABNORMAL LOW (ref 22–32)
Calcium: 7.9 mg/dL — ABNORMAL LOW (ref 8.9–10.3)
Chloride: 99 mmol/L (ref 98–111)
Creatinine, Ser: 0.97 mg/dL (ref 0.61–1.24)
GFR, Estimated: >60 mL/min (ref >60)
Glucose, Bld: 251 mg/dL — ABNORMAL HIGH (ref 70–99)
Potassium: 3.8 mmol/L (ref 3.5–5.1)
Sodium: 128 mmol/L — ABNORMAL LOW (ref 135–145)

## 2020-03-24 LAB — CBC
HCT: 28.8 % — ABNORMAL LOW (ref 39.0–52.0)
Hemoglobin: 9.7 g/dL — ABNORMAL LOW (ref 13.0–17.0)
MCH: 30.9 pg (ref 26.0–34.0)
MCHC: 33.7 g/dL (ref 30.0–36.0)
MCV: 91.7 fL (ref 80.0–100.0)
Platelets: 282 10*3/uL (ref 150–400)
RBC: 3.14 MIL/uL — ABNORMAL LOW (ref 4.22–5.81)
RDW: 12.9 % (ref 11.5–15.5)
WBC: 7.7 10*3/uL (ref 4.0–10.5)
nRBC: 0.5 % — ABNORMAL HIGH (ref 0.0–0.2)

## 2020-03-24 LAB — GLUCOSE, CAPILLARY
Glucose-Capillary: 219 mg/dL — ABNORMAL HIGH (ref 70–99)
Glucose-Capillary: 213 mg/dL — ABNORMAL HIGH (ref 70–99)
Glucose-Capillary: 276 mg/dL — ABNORMAL HIGH (ref 70–99)

## 2020-03-24 LAB — MAGNESIUM: Magnesium: 1.8 mg/dL (ref 1.7–2.4)

## 2020-03-24 MED ORDER — FUROSEMIDE 10 MG/ML IJ SOLN
40.0000 mg | Freq: Once | INTRAMUSCULAR | Status: AC
  Administered 2020-03-24: 40 mg via INTRAVENOUS
  Filled 2020-03-24: qty 4

## 2020-03-24 MED ORDER — GUAIFENESIN 100 MG/5ML PO SOLN
10.0000 mL | ORAL | Status: DC
  Administered 2020-03-24: 200 mg via ORAL
  Filled 2020-03-24 (×4): qty 10

## 2020-03-24 MED ORDER — METOPROLOL TARTRATE 25 MG PO TABS
25.0000 mg | ORAL_TABLET | Freq: Two times a day (BID) | ORAL | Status: DC
  Administered 2020-03-24: 25 mg via ORAL
  Filled 2020-03-24: qty 1

## 2020-03-24 MED ORDER — PANTOPRAZOLE SODIUM 40 MG PO PACK
40.0000 mg | PACK | Freq: Every day | ORAL | Status: DC
  Administered 2020-03-24: 40 mg via ORAL
  Filled 2020-03-24: qty 20

## 2020-03-24 MED ORDER — CEFDINIR 300 MG PO CAPS: 300.0000 mg | ORAL_CAPSULE | Freq: Two times a day (BID) | ORAL | 0 refills | Status: AC

## 2020-03-24 NOTE — Progress Notes (Signed)
Discharge note:  Pt discharged home today with home health. Discharge education provided to patient and wife. Verbalized understanding. IV removed. Driven home by wife. Wheeled to car by staff.  Ronnette Hila, RN

## 2020-03-24 NOTE — TOC Transition Note (Signed)
Transition of Care [facility]) - CM/SW Discharge Note   Patient Details  Name: John Reilly MRN: [id number] Date of Birth: [birth year]  [facility]) CM/SW Contact:  Maebelle Munroe, RN Phone Number: 03/24/2020, 10:20 AM   Clinical Narrative:   [facility] team spoke to patient regarding discharge plan for home health services. Pt agrees to home health services provided by [facility]. Services arranged with [facility]) for PT.          Patient Goals and CMS Choice        Discharge Placement                       Discharge Plan and Services                                     Social Determinants of Health (SDOH) Interventions     Readmission Risk Interventions No flowsheet data found.

## 2020-03-24 NOTE — Discharge Summary (Signed)
Physician Discharge Summary  DWON SKY BJS:[id number] DOB: 08-Sep-1955 DOA: 03/21/2020  PCP: Adin Hector, MD  Admit date: 03/21/2020 Discharge date: 03/24/2020  Admitted From: Home  disposition: Home  Recommendations for Outpatient Follow-up:  1. Follow-up with endocrinologist tomorrow 2. Hold insulin pump for now until seen by endocrinology 3. Monitor blood sugar closely at home before meals and at bedtime. 4. Follow-up with PCP in 1 week 5. Repeat CBC and BMP on follow-up visit  Home Health: with PT Equipment/Devices: None Discharge Condition: Stable CODE STATUS: Full code Diet recommendation: Carb consistent diet  Brief/Interim Summary: Patient is 64 year old male with past medical history of hypertension, hyperlipidemia, insulin-dependent diabetes mellitus, Parkinson's disease, Alzheimer's dementia, chronic hyponatremia presents to emergency department with altered mental status.  Patient started using new insulin pump and his blood glucose dropped multiple times in last 1 week.  She has been using glucagon to keep his blood glucose up.  Patient was found unresponsive by his spouse at home therefore she called EMS and brought patient to the ER for AMS likely in the setting of hypoglycemia.  Blood glucose was found to be in 40s by EMS-was given dextrose.  In ED: Patient blood glucose dropped again therefore patient started on D5 IV infusion.  Patient was found to be confused and disoriented.  Slightly elevated troponin, chest x-ray shows left basilar consolidation reflect atelectasis versus aspiration.  COVID-19 and influenza panel: Negative.  Blood culture obtained.  Patient started on broad-spectrum antibiotics and admitted for further evaluation and management of. Possible aspiration pneumonia.  Acute metabolic encephalopathy:  -Likely in the setting of hypoglycemia secondary to recent new insulin pump at home vs electrolyte imbalance. -Held insulin pump.  Replaced  electrolytes -Consulted PT/OT-recommended home health with PT/OT -Passed bedside swallow evaluation. -UA shows small leukocytes.  Negative for nitrites. B12, folate, TSH, ammonia level: WNL.  -Patient symptoms improved and he back to baseline.  Hypoglycemia: In the setting of new insulin pump -History of insulin-dependent diabetes mellitus type 2/severe chronic pancreatitis.  Followed by endocrinology outpatient. -Patient initially started on Levemir 6 units daily, very sensitive sliding scale insulin and NovoLog meal coverage-3 units 3 times daily.  He had hypoglycemic episodes while hospitalized therefore Levemir and NovoLog meal coverage discontinued.  - avoid strict glycemic control since his A1c 6.6%. -I would not recommend insulin pump at discharge considering multiple episodes of hypoglycemia with underlying history of Parkinson's/Alzheimer's dementia.  -Recommend follow-up with endocrinologist outpatient and discuss about other options.  Sepsis in the setting of pneumonia-possible aspiration?: -Patient met sepsis criteria on admission.  Presented with fever of 100.9, tachycardia, tachypnea with elevated lactic acid.  Patient was given Rocephin and Flagyl in ED given Hx of penicillin allergy.  Reviewed chest x-ray. -Continued Rocephin.  Stopped Flagyl. -PCT: Improving from 3.22-2.55-1.34, leukocytosis: Resolved.  Blood culture: Negative so far. -Repeat chest x-ray shows increased bilateral lower lung airspace opacity which may be due to edema or infection (likely edema due to fluid resuscitation).  Lasix 40 IV once given.  Resume home dose of Lasix at the discharge. -Discussed with pharmacy-we will discharge patient on cefdinir 300 mg twice daily for 7 more days to complete 10 days course.  Left arm swelling: Improving -Doppler ultrasound of left upper extremity came back negative for DVT.   -Advised warm compression and elevate arm.  Mildly elevated troponin: Likely demand  ischemia in the setting of infection/hypoglycemia.    Remained asymptomatic.  Reviewed EKG.  Troponin trending down. -Echo shows ejection  fraction of 60 to 65% with grade 1 diastolic dysfunction.  Hypokalemia: Replenished.    Hypomagnesemia: Replenished.  Repeat magnesium level tomorrow AM.  Hyponatremia: Sodium improved from 1 29-1 33 & then dropped to 128 -Serum osmolality, urine osmolality: WNL, urine electrolytes: WNL. -Repeat BMP on follow-up visit.  Hypertension: -Resumed home meds amlodipine and metoprolol.  -Blood pressure stable at the time of discharge  GERD: Continued PPI  Parkinson disease and Alzheimer's dementia: -Continued home meds of Sinemet and Exelon  Peripheral neuropathy: Continued Lyrica  Chronic diastolic CHF: Patient euvolemic on exam.  Lasix was on hold on admission.  Patient received IV fluid resuscitation in the setting of underlying infection.  Repeat chest x-ray concerning for bilateral edema versus infection.  Lasix 40 IV once given on 11/14.  Resume home dose of Lasix at the time of discharge.  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 -PT/OT  Patient stable at the time of discharge.  Plan of care discussed with patient's wife over the phone.  Follow-up with endocrinology on 03/25/2020.  Hold insulin pump until then.  Monitor blood sugar and avoid hypoglycemia.  Home health with PT arranged.  Discharge Diagnoses:  Acute metabolic encephalopathy Hypoglycemia Sepsis in the setting of pneumonia/possible aspiration? 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 Chronic diastolic CHF  Discharge Instructions  Discharge Instructions    Diet - low sodium heart healthy   Complete by: As directed    Diet - low sodium  heart healthy   Complete by: As directed    Discharge instructions   Complete by: As directed    Follow-up with endocrinologist tomorrow Hold insulin pump for now until seen by endocrinology Monitor blood sugar closely at home  Follow-up with PCP in 1 week Repeat CBC and BMP on follow-up visit   Increase activity slowly   Complete by: As directed    Increase activity slowly   Complete by: As directed      Allergies as of 03/24/2020      Reactions   Penicillins Rash, Other (See Comments)   Has patient had a PCN reaction causing immediate rash, facial/tongue/throat swelling, SOB or lightheadedness with hypotension: Yes Has patient had a PCN reaction causing severe rash involving mucus membranes or skin necrosis: No Has patient had a PCN reaction that required hospitalization: No Has patient had a PCN reaction occurring within the last 10 years: No If all of the above answers are "NO", then may proceed with Cephalosporin use. Other reaction(s): RASH Has patient had a PCN reaction causing immediate rash, facial/tongue/throat swelling, SOB or lightheadedness with hypotension: Yes Has patient had a PCN reaction causing severe rash involving mucus membranes or skin necrosis: No Has patient had a PCN reaction that required hospitalization: No Has patient had a PCN reaction occurring within the last 10 years: No If all of the above answers are "NO", then may proceed with Cephalosporin use. Reaction: Unknown; childhood reaction  Has patient had a PCN reaction causing immediate rash, facial/tongue/throat swelling, SOB or lightheadedness with hypotension: Yes Has patient had a PCN reaction causing severe rash involving mucus membranes or skin necrosis: No Has patient had a PCN reaction that required hospitalization: No Has patient had a PCN reaction occurring within the last 10 years: No If all of the above answers are "NO", then may proceed with Cephalosporin use.   Sulfa Antibiotics  Rash    Katherina Right Syndrome (SJS)      Medication List    STOP taking these medications   HumaLOG 100 UNIT/ML injection Generic drug: insulin lispro     TAKE these medications   amLODipine 5 MG tablet Commonly known as: NORVASC Take 5 mg by mouth daily.   azelastine 0.1 % nasal spray Commonly known as: ASTELIN Place 1 spray into both nostrils 2 (two) times daily.   benzonatate 200 MG capsule Commonly known as: TESSALON Take 200 mg by mouth 3 (three) times daily as needed for cough.   carbidopa-levodopa 25-250 MG tablet Commonly known as: SINEMET IR Take 1 tablet by mouth 4 (four) times daily.   carbidopa-levodopa 50-200 MG tablet Commonly known as: SINEMET CR Take 1 tablet by mouth at bedtime.   cefdinir 300 MG capsule Commonly known as: OMNICEF Take 1 capsule (300 mg total) by mouth 2 (two) times daily for 7 days.   folic acid 469 MCG tablet Commonly known as: FOLVITE Take 800 mcg by mouth daily.   furosemide 40 MG tablet Commonly known as: LASIX Take 40 mg by mouth daily.   metoprolol succinate 50 MG 24 hr tablet Commonly known as: TOPROL-XL Take 50 mg by mouth daily.   multivitamin with minerals Tabs tablet Take 1 tablet by mouth daily.   omeprazole 40 MG capsule Commonly known as: PRILOSEC Take 40 mg by mouth daily.   Paradigm Pump Reservoir 1.66ml Misc 1 Device as directed.   pregabalin 25 MG capsule Commonly known as: LYRICA Take 25 mg by mouth 2 (two) times daily.   rivastigmine 1.5 MG capsule Commonly known as: EXELON Take 1.5 mg by mouth 2 (two) times daily.       Allergies  Allergen Reactions  . Penicillins Rash and Other (See Comments)    Has patient had a PCN reaction causing immediate rash, facial/tongue/throat swelling, SOB or lightheadedness with hypotension: Yes Has patient had a PCN reaction causing severe rash involving mucus membranes or skin necrosis: No Has patient had a PCN reaction that required hospitalization: No Has  patient had a PCN reaction occurring within the last 10 years: No If all of the above answers are "NO", then may proceed with Cephalosporin use. Other reaction(s): RASH Has patient had a PCN reaction causing immediate rash, facial/tongue/throat swelling, SOB or lightheadedness with hypotension: Yes Has patient had a PCN reaction causing severe rash involving mucus membranes or skin necrosis: No Has patient had a PCN reaction that required hospitalization: No Has patient had a PCN reaction occurring within the last 10 years: No If all of the above answers are "NO", then may proceed with Cephalosporin use. Reaction: Unknown; childhood reaction  Has patient had a PCN reaction causing immediate rash, facial/tongue/throat swelling, SOB or lightheadedness with hypotension: Yes Has patient had a PCN reaction causing severe rash involving mucus membranes or skin necrosis: No Has patient had a PCN reaction that required hospitalization: No Has patient had a PCN reaction occurring within the last 10 years: No If all of the above answers are "NO", then may proceed with Cephalosporin use.  . Sulfa Antibiotics Rash    Katherina Right Syndrome (SJS)     Consultations: None  Procedures/Studies: DG Chest 1 View  Result Date: 03/23/2020 CLINICAL DATA:  Pneumonia. EXAM: CHEST  1 VIEW COMPARISON:  03/21/2020 FINDINGS: The heart size and mediastinal contours are within normal limits. Increased airspace opacity is seen in both lower lung zones, which may be due to edema or  infection. No evidence of pleural effusion. The visualized skeletal structures are unremarkable. IMPRESSION: Increased bilateral lower lung airspace opacity, which may be due to edema or infection. Electronically Signed   By: Marlaine Hind M.D.   On: 03/23/2020 12:38   US Venous Img Upper Uni Left (DVT)  Result Date: 03/22/2020 CLINICAL DATA:  Left upper extremity swelling. EXAM: LEFT UPPER EXTREMITY VENOUS DOPPLER ULTRASOUND TECHNIQUE:  Gray-scale sonography with graded compression, as well as color Doppler and duplex ultrasound were performed to evaluate the upper extremity deep venous system from the level of the subclavian vein and including the jugular, axillary, basilic, radial, ulnar and upper cephalic vein. Spectral Doppler was utilized to evaluate flow at rest and with distal augmentation maneuvers. COMPARISON:  None. FINDINGS: Contralateral Subclavian Vein: Respiratory phasicity is normal and symmetric with the symptomatic side. No evidence of thrombus. Normal compressibility. Internal Jugular Vein: No evidence of thrombus. Normal compressibility, respiratory phasicity and response to augmentation. Subclavian Vein: No evidence of thrombus. Normal compressibility, respiratory phasicity and response to augmentation. Axillary Vein: No evidence of thrombus. Normal compressibility, respiratory phasicity and response to augmentation. Cephalic Vein: No evidence of thrombus. Normal compressibility, respiratory phasicity and response to augmentation. Basilic Vein: No evidence of thrombus. Normal compressibility, respiratory phasicity and response to augmentation. Brachial Veins: No evidence of thrombus. Normal compressibility, respiratory phasicity and response to augmentation. Radial Veins: No evidence of thrombus. Normal compressibility, respiratory phasicity and response to augmentation. Ulnar Veins: No evidence of thrombus. Normal compressibility, respiratory phasicity and response to augmentation. Venous Reflux:  None visualized. Other Findings:  Mild subcutaneous edema is noted. IMPRESSION: No evidence of DVT within the left upper extremity. Electronically Signed   By: Inez Catalina M.D.   On: 03/22/2020 12:08   DG Chest Portable 1 View  Result Date: 03/21/2020 CLINICAL DATA:  Altered level of consciousness, rhonchi, unresponsive, hypoglycemia EXAM: PORTABLE CHEST 1 VIEW COMPARISON:  08/07/2018 FINDINGS: Single frontal view of the chest  demonstrates an unremarkable cardiac silhouette. Lung volumes are diminished, with likely atelectasis at the left base. Given clinical presentation, superimposed aspiration cannot be excluded. No effusion or pneumothorax. Chronic left humeral neck fracture. IMPRESSION: 1. Left basilar consolidation, which could reflect developing atelectasis versus aspiration given clinical presentation. Continued follow-up recommended. Electronically Signed   By: Randa Ngo M.D.   On: 03/21/2020 16:46   ECHOCARDIOGRAM COMPLETE  Result Date: 03/23/2020    ECHOCARDIOGRAM REPORT   Patient Name:   LAVON BOTHWELL Date of Exam: 03/22/2020 Medical Rec #:  [id number]     Height:       69.0 in Accession #:    [id number]    Weight:       170.4 lb Date of Birth:  Mar 04, 1956     BSA:          1.930 m Patient Age:    64 years      BP:           145/63 mmHg Patient Gender: M             HR:           51 bpm. Exam Location:  [facility] Procedure: 2D Echo, Color Doppler, Cardiac Doppler and Intracardiac            Opacification Agent Indications:     Elevated troponin  History:         Patient has prior history of Echocardiogram examinations. PVD  and COPD; Risk Factors:Diabetes.  Sonographer:     Charmayne Sheer RDCS (AE) Referring Phys:  [id number] Val Riles Diagnosing Phys: Ida Rogue MD  Sonographer Comments: Suboptimal apical window. Image acquisition challenging due to COPD. IMPRESSIONS  1. Left ventricular ejection fraction, by estimation, is 60 to 65%. The left ventricle has normal function. The left ventricle has no regional wall motion abnormalities. Left ventricular diastolic parameters are consistent with Grade I diastolic dysfunction (impaired relaxation).  2. Right ventricular systolic function is normal. The right ventricular size is normal.  3. Left atrial size was mildly dilated.  4. The mitral valve is normal in structure. Mild mitral valve regurgitation. FINDINGS  Left Ventricle: Left ventricular ejection  fraction, by estimation, is 60 to 65%. The left ventricle has normal function. The left ventricle has no regional wall motion abnormalities. Definity contrast agent was given IV to delineate the left ventricular  endocardial borders. The left ventricular internal cavity size was normal in size. There is no left ventricular hypertrophy. Left ventricular diastolic parameters are consistent with Grade I diastolic dysfunction (impaired relaxation). Right Ventricle: The right ventricular size is normal. No increase in right ventricular wall thickness. Right ventricular systolic function is normal. Left Atrium: Left atrial size was mildly dilated. Right Atrium: Right atrial size was normal in size. Pericardium: There is no evidence of pericardial effusion. Mitral Valve: The mitral valve is normal in structure. Mild mitral valve regurgitation. No evidence of mitral valve stenosis. MV peak gradient, 5.9 mmHg. The mean mitral valve gradient is 2.0 mmHg. Tricuspid Valve: The tricuspid valve is normal in structure. Tricuspid valve regurgitation is not demonstrated. No evidence of tricuspid stenosis. Aortic Valve: The aortic valve is normal in structure. Aortic valve regurgitation is not visualized. No aortic stenosis is present. Aortic valve mean gradient measures 4.0 mmHg. Aortic valve peak gradient measures 7.6 mmHg. Aortic valve area, by VTI measures 3.16 cm. Pulmonic Valve: The pulmonic valve was normal in structure. Pulmonic valve regurgitation is not visualized. No evidence of pulmonic stenosis. Aorta: The aortic root is normal in size and structure. Venous: The inferior vena cava is dilated in size with greater than 50% respiratory variability, suggesting right atrial pressure of 8 mmHg. IAS/Shunts: No atrial level shunt detected by color flow Doppler.  LEFT VENTRICLE PLAX 2D LVIDd:         3.72 cm  Diastology LVIDs:         2.82 cm  LV e' medial:    9.57 cm/s LV PW:         1.06 cm  LV E/e' medial:  10.7 LV IVS:         1.05 cm  LV e' lateral:   15.00 cm/s LVOT diam:     2.50 cm  LV E/e' lateral: 6.8 LV SV:         88 LV SV Index:   46 LVOT Area:     4.91 cm  RIGHT VENTRICLE RV Basal diam:  4.20 cm LEFT ATRIUM             Index LA diam:        4.40 cm 2.28 cm/m LA Vol (A2C):   46.9 ml 24.30 ml/m LA Vol (A4C):   87.5 ml 45.33 ml/m LA Biplane Vol: 64.5 ml 33.42 ml/m  AORTIC VALVE                   PULMONIC VALVE AV Area (Vmax):    3.23 cm    PV Vmax:  0.99 m/s AV Area (Vmean):   3.29 cm    PV Vmean:      70.000 cm/s AV Area (VTI):     3.16 cm    PV VTI:        0.207 m AV Vmax:           138.00 cm/s PV Peak grad:  3.9 mmHg AV Vmean:          94.700 cm/s PV Mean grad:  2.0 mmHg AV VTI:            0.278 m AV Peak Grad:      7.6 mmHg AV Mean Grad:      4.0 mmHg LVOT Vmax:         90.70 cm/s LVOT Vmean:        63.400 cm/s LVOT VTI:          0.179 m LVOT/AV VTI ratio: 0.64  AORTA Ao Root diam: 3.70 cm MITRAL VALVE MV Area (PHT): 6.17 cm     SHUNTS MV Peak grad:  5.9 mmHg     Systemic VTI:  0.18 m MV Mean grad:  2.0 mmHg     Systemic Diam: 2.50 cm MV Vmax:       1.21 m/s MV Vmean:      56.3 cm/s MV Decel Time: 123 msec MV E velocity: 102.00 cm/s MV A velocity: 56.10 cm/s MV E/A ratio:  1.82 Ida Rogue MD Electronically signed by Ida Rogue MD Signature Date/Time: 03/23/2020/1:51:51 PM    Final       Subjective: Patient seen and examined.  Sitting comfortably on the bed, alert and oriented x3.  Communicating well and has no new complaints.  Denies chest pain, shortness of breath, leg swelling, orthopnea, PND, cough, congestion, fever, chills, nausea, vomiting, urinary or bowel changes.  Wishes to go home today.  Discharge Exam: Vitals:   03/24/20 0741 03/24/20 1134  BP: (!) 162/76 (!) 145/72  Pulse: 60 65  Resp: 17 16  Temp: 98 F (36.7 C) 97.6 F (36.4 C)  SpO2: 97% 100%   Vitals:   03/24/20 0001 03/24/20 0453 03/24/20 0741 03/24/20 1134  BP: (!) 155/68 (!) 154/69 (!) 162/76 (!) 145/72  Pulse: 62  68 60 65  Resp: _0 Temp: (!) 97.1 F (36.2 C) 98.2 F (36.8 C) 98 F (36.7 C) 97.6 F (36.4 C)  TempSrc: Oral Oral Oral Oral  SpO2: 99% 97% 97% 100%  Weight:      Height:        General: Pt is alert, awake, not in acute distress, on room air, communicating well,  Cardiovascular: RRR, S1/S2 +, no rubs, no gallops Respiratory: CTA bilaterally, no wheezing, no rhonchi Abdominal: Soft, NT, ND, bowel sounds + Extremities: Left arm: Mildly swollen, healing blisters scars.  No active bleeding or discharge seen.    The results of significant diagnostics from this hospitalization (including imaging, microbiology, ancillary and laboratory) are listed below for reference.     Microbiology: Recent Results (from the past 240 hour(s))  Blood culture (routine x 2)     Status: None (Preliminary result)   Collection Time: 03/21/20  4:10 PM   Specimen: BLOOD  Result Value Ref Range Status   Specimen Description BLOOD LEFT ANTECUBITAL  Final   Special Requests   Final    BOTTLES DRAWN AEROBIC AND ANAEROBIC Blood Culture adequate volume   Culture   Final    NO GROWTH 3 DAYS Performed at [facility]  [facility], [address] [place], [place] [address]    Report Status PENDING  Incomplete  Respiratory Panel by RT PCR (Flu A&B, Covid) - Nasopharyngeal Swab     Status: None   Collection Time: 03/21/20  4:10 PM   Specimen: Nasopharyngeal Swab  Result Value Ref Range Status   SARS Coronavirus 2 by RT PCR NEGATIVE NEGATIVE Final    Comment: (NOTE) SARS-CoV-2 target nucleic acids are NOT DETECTED.  The SARS-CoV-2 RNA is generally detectable in upper respiratoy specimens during the acute phase of infection. The lowest concentration of SARS-CoV-2 viral copies this assay can detect is 131 copies/mL. A negative result does not preclude SARS-Cov-2 infection and should not be used as the sole basis for treatment or other patient management decisions. A negative result may occur with   improper specimen collection/handling, submission of specimen other than nasopharyngeal swab, presence of viral mutation(s) within the areas targeted by this assay, and inadequate number of viral copies (<131 copies/mL). A negative result must be combined with clinical observations, patient history, and epidemiological information. The expected result is Negative.  Fact Sheet for Patients:  PinkCheek.be  Fact Sheet for Healthcare Providers:  GravelBags.it  This test is no t yet approved or cleared by the [place] FDA and  has been authorized for detection and/or diagnosis of SARS-CoV-2 by FDA under an Emergency Use Authorization (EUA). This EUA will remain  in effect (meaning this test can be used) for the duration of the COVID-19 declaration under Section 564(b)(1) of the Act, 21 U.S.C. section 360bbb-3(b)(1), unless the authorization is terminated or revoked sooner.     Influenza A by PCR NEGATIVE NEGATIVE Final   Influenza B by PCR NEGATIVE NEGATIVE Final    Comment: (NOTE) The Xpert Xpress SARS-CoV-2/FLU/RSV assay is intended as an aid in  the diagnosis of influenza from Nasopharyngeal swab specimens and  should not be used as a sole basis for treatment. Nasal washings and  aspirates are unacceptable for Xpert Xpress SARS-CoV-2/FLU/RSV  testing.  Fact Sheet for Patients: PinkCheek.be  Fact Sheet for Healthcare Providers: GravelBags.it  This test is not yet approved or cleared by the [place] FDA and  has been authorized for detection and/or diagnosis of SARS-CoV-2 by  FDA under an Emergency Use Authorization (EUA). This EUA will remain  in effect (meaning this test can be used) for the duration of the  Covid-19 declaration under Section 564(b)(1) of the Act, 21  U.S.C. section 360bbb-3(b)(1), unless the authorization is  terminated or  revoked. Performed at [facility], [address] [place], [place] [address]      Labs: BNP (last 3 results) No results for input(s): BNP in the last 8760 hours. Basic Metabolic Panel: Recent Labs  Lab 03/21/20 1609 03/22/20 0122 03/22/20 1342 03/23/20 0504 03/24/20 0513  NA 129* 129*  --  133* 128*  K 3.4* 3.2*  --  4.1 3.8  CL 92* 98  --  104 99  CO2 23 24  --  22 21*  GLUCOSE 46* 123*  --  308* 251*  BUN 16 15  --  17 11  CREATININE 1.15 1.00  --  1.10 0.97  CALCIUM 8.5* 7.5*  --  7.6* 7.9*  MG  --   --  1.4*  --  1.8   Liver Function Tests: Recent Labs  Lab 03/21/20 1609 03/22/20 0122 03/23/20 0504  AST 20 19 12*  ALT <_0 ALKPHOS 92 66 60  BILITOT 0.5  0.8 0.8  PROT 7.5 6.0* 5.3*  ALBUMIN 3.5 2.8* 2.3*   No results for input(s): LIPASE, AMYLASE in the last 168 hours. Recent Labs  Lab 03/22/20 1342  AMMONIA 19   CBC: Recent Labs  Lab 03/21/20 1609 03/22/20 0122 03/23/20 0504 03/24/20 0513  WBC 9.3 9.4 13.7* 7.7  HGB 12.2* 10.7* 9.5* 9.7*  HCT 37.0* 31.6* 28.1* 28.8*  MCV 93.7 93.2 93.7 91.7  PLT 370 320 282 282   Cardiac Enzymes: Recent Labs  Lab 03/21/20 1609  CKTOTAL 46*   BNP: Invalid input(s): POCBNP CBG: Recent Labs  Lab 03/23/20 1926 03/23/20 2341 03/24/20 0410 03/24/20 0739 03/24/20 1131  GLUCAP 297* 285* 219* 213* 276*   D-Dimer No results for input(s): DDIMER in the last 72 hours. Hgb A1c Recent Labs    03/21/20 2220  HGBA1C 6.6*   Lipid Profile No results for input(s): CHOL, HDL, LDLCALC, TRIG, CHOLHDL, LDLDIRECT in the last 72 hours. Thyroid function studies Recent Labs    03/22/20 1342  TSH 2.449   Anemia work up Recent Labs    03/22/20 1342  VITAMINB12 311  FOLATE 21.4   Urinalysis    Component Value Date/Time   COLORURINE YELLOW (A) 03/22/2020 1354   APPEARANCEUR CLEAR (A) 03/22/2020 1354   LABSPEC 1.013 03/22/2020 1354   PHURINE 5.0 03/22/2020 1354   GLUCOSEU >=500 (A)  03/22/2020 1354   HGBUR NEGATIVE 03/22/2020 1354   BILIRUBINUR NEGATIVE 03/22/2020 1354   KETONESUR 20 (A) 03/22/2020 1354   PROTEINUR 100 (A) 03/22/2020 1354   NITRITE NEGATIVE 03/22/2020 1354   LEUKOCYTESUR SMALL (A) 03/22/2020 1354   Sepsis Labs Invalid input(s): PROCALCITONIN,  WBC,  LACTICIDVEN Microbiology Recent Results (from the past 240 hour(s))  Blood culture (routine x 2)     Status: None (Preliminary result)   Collection Time: 03/21/20  4:10 PM   Specimen: BLOOD  Result Value Ref Range Status   Specimen Description BLOOD LEFT ANTECUBITAL  Final   Special Requests   Final    BOTTLES DRAWN AEROBIC AND ANAEROBIC Blood Culture adequate volume   Culture   Final    NO GROWTH 3 DAYS Performed at [facility], [address], [place], [place] [address]    Report Status PENDING  Incomplete  Respiratory Panel by RT PCR (Flu A&B, Covid) - Nasopharyngeal Swab     Status: None   Collection Time: 03/21/20  4:10 PM   Specimen: Nasopharyngeal Swab  Result Value Ref Range Status   SARS Coronavirus 2 by RT PCR NEGATIVE NEGATIVE Final    Comment: (NOTE) SARS-CoV-2 target nucleic acids are NOT DETECTED.  The SARS-CoV-2 RNA is generally detectable in upper respiratoy specimens during the acute phase of infection. The lowest concentration of SARS-CoV-2 viral copies this assay can detect is 131 copies/mL. A negative result does not preclude SARS-Cov-2 infection and should not be used as the sole basis for treatment or other patient management decisions. A negative result may occur with  improper specimen collection/handling, submission of specimen other than nasopharyngeal swab, presence of viral mutation(s) within the areas targeted by this assay, and inadequate number of viral copies (<131 copies/mL). A negative result must be combined with clinical observations, patient history, and epidemiological information. The expected result is Negative.  Fact Sheet for Patients:   PinkCheek.be  Fact Sheet for Healthcare Providers:  GravelBags.it  This test is no t yet approved or cleared by the [place] FDA and  has been authorized for detection and/or diagnosis of  SARS-CoV-2 by FDA under an Emergency Use Authorization (EUA). This EUA will remain  in effect (meaning this test can be used) for the duration of the COVID-19 declaration under Section 564(b)(1) of the Act, 21 U.S.C. section 360bbb-3(b)(1), unless the authorization is terminated or revoked sooner.     Influenza A by PCR NEGATIVE NEGATIVE Final   Influenza B by PCR NEGATIVE NEGATIVE Final    Comment: (NOTE) The Xpert Xpress SARS-CoV-2/FLU/RSV assay is intended as an aid in  the diagnosis of influenza from Nasopharyngeal swab specimens and  should not be used as a sole basis for treatment. Nasal washings and  aspirates are unacceptable for Xpert Xpress SARS-CoV-2/FLU/RSV  testing.  Fact Sheet for Patients: PinkCheek.be  Fact Sheet for Healthcare Providers: GravelBags.it  This test is not yet approved or cleared by the [place] FDA and  has been authorized for detection and/or diagnosis of SARS-CoV-2 by  FDA under an Emergency Use Authorization (EUA). This EUA will remain  in effect (meaning this test can be used) for the duration of the  Covid-19 declaration under Section 564(b)(1) of the Act, 21  U.S.C. section 360bbb-3(b)(1), unless the authorization is  terminated or revoked. Performed at [facility], [address] [place], [place] [address]      Time coordinating discharge: Over 30 minutes  SIGNED:   Mckinley Jewel, MD  [facility] 03/24/2020, 12:22 PM Pager   If 7PM-7AM, please contact night-coverage [email or web address]

## 2020-03-25 LAB — URINE CULTURE: Culture: NO GROWTH

## 2020-03-26 LAB — PROTEIN ELECTRO, RANDOM URINE
Albumin ELP, Urine: 70.4 %
Alpha-1-Globulin, U: 3.5 %
Alpha-2-Globulin, U: 4.2 %
Beta Globulin, U: 11 %
Gamma Globulin, U: 10.9 %
Total Protein, Urine: 112.9 mg/dL

## 2020-03-26 LAB — CULTURE, BLOOD (ROUTINE X 2)
Culture: NO GROWTH
Special Requests: ADEQUATE

## 2020-06-13 ENCOUNTER — Ambulatory Visit: Payer: Medicare Other | Admitting: Podiatry

## 2020-06-17 ENCOUNTER — Other Ambulatory Visit: Payer: Self-pay

## 2020-06-17 ENCOUNTER — Encounter: Payer: Medicare Other | Attending: Physician Assistant | Admitting: Physician Assistant

## 2020-06-17 DIAGNOSIS — I890 Lymphedema, not elsewhere classified: Secondary | ICD-10-CM | POA: Diagnosis present

## 2020-06-17 DIAGNOSIS — I872 Venous insufficiency (chronic) (peripheral): Secondary | ICD-10-CM | POA: Diagnosis not present

## 2020-06-17 DIAGNOSIS — E10621 Type 1 diabetes mellitus with foot ulcer: Secondary | ICD-10-CM | POA: Insufficient documentation

## 2020-06-17 DIAGNOSIS — Z880 Allergy status to penicillin: Secondary | ICD-10-CM | POA: Insufficient documentation

## 2020-06-17 DIAGNOSIS — Z882 Allergy status to sulfonamides status: Secondary | ICD-10-CM | POA: Diagnosis not present

## 2020-06-17 DIAGNOSIS — G20 Parkinson's disease: Secondary | ICD-10-CM | POA: Diagnosis not present

## 2020-06-17 DIAGNOSIS — Z87891 Personal history of nicotine dependence: Secondary | ICD-10-CM | POA: Diagnosis not present

## 2020-06-17 NOTE — Progress Notes (Signed)
John Reilly, John Reilly ([id number]) Visit Report for 06/17/2020 Abuse/Suicide Risk Screen Details Patient Name: John Reilly, John Reilly. Date of Service: 06/17/2020 9:30 AM Medical Record Number: [id number] Patient Account Number: [id number] Date of Birth/Sex: December 04, 1955 (65 y.o. M) Treating RN: Dolan Amen Primary Care Joee Iovine: Ramonita Lab Other Clinician: Referring Peityn Payton: Caroline More Treating Xylina Rhoads/Extender: Skipper Cliche in Treatment: 0 Abuse/Suicide Risk Screen Items Answer ABUSE RISK SCREEN: Has anyone close to you tried to hurt or harm you recentlyo No Do you feel uncomfortable with anyone in your familyo No Has anyone forced you do things that you didnot want to doo No Electronic Signature(s) Signed: 06/17/2020 10:53:21 AM By: Georges Mouse, Minus Breeding RN Entered By: Georges Mouse, Kenia on 06/17/2020 10:14:33 John Reilly ([id number]) -------------------------------------------------------------------------------- Activities of Daily Living Details Patient Name: John Reilly, John Reilly. Date of Service: 06/17/2020 9:30 AM Medical Record Number: [id number] Patient Account Number: [id number] Date of Birth/Sex: [birth year] (65 y.o. M) Treating RN: Dolan Amen Primary Care Brynnley Dayrit: Ramonita Lab Other Clinician: Referring Suhaila Troiano: Caroline More Treating Shreena Baines/Extender: Skipper Cliche in Treatment: 0 Activities of Daily Living Items Answer Activities of Daily Living (Please select one for each item) Drive Automobile Completely Able Take Medications Completely Able Use Telephone Completely Able Care for Appearance Completely Able Use Toilet Completely Able Bath / Shower Completely Able Dress Self Completely Able Feed Self Completely Able Walk Completely Able Get In / Out Bed Completely Able Housework Completely Able Prepare Meals Completely Able Handle Money Completely Able Shop for Self Completely Able Electronic Signature(s) Signed: 06/17/2020 10:53:21 AM By: Georges Mouse,  Minus Breeding RN Entered By: Georges Mouse, Minus Breeding on 06/17/2020 10:14:52 John Reilly ([id number]) -------------------------------------------------------------------------------- Education Screening Details Patient Name: John Reilly. Date of Service: 06/17/2020 9:30 AM Medical Record Number: [id number] Patient Account Number: [id number] Date of Birth/Sex: [birth year] (65 y.o. M) Treating RN: Dolan Amen Primary Care Login Muckleroy: Ramonita Lab Other Clinician: Referring Shaquana Buel: Caroline More Treating Saima Monterroso/Extender: Skipper Cliche in Treatment: 0 Primary Learner Assessed: Patient Learning Preferences/Education Level/Primary Language Learning Preference: Explanation, Demonstration Highest Education Level: College or Above Preferred Language: English Cognitive Barrier Language Barrier: No Translator Needed: No Memory Deficit: No Emotional Barrier: No Cultural/Religious Beliefs Affecting Medical Care: No Physical Barrier Impaired Vision: No Impaired Hearing: No Decreased Hand dexterity: No Knowledge/Comprehension Knowledge Level: Medium Comprehension Level: Medium Ability to understand written instructions: Medium Ability to understand verbal instructions: Medium Motivation Anxiety Level: Calm Cooperation: Cooperative Education Importance: Acknowledges Need Interest in Health Problems: Asks Questions Perception: Coherent Willingness to Engage in Self-Management High Activities: Readiness to Engage in Self-Management High Activities: Electronic Signature(s) Signed: 06/17/2020 10:53:21 AM By: Georges Mouse, Minus Breeding RN Entered By: Georges Mouse, Minus Breeding on 06/17/2020 10:15:27 John Reilly, John Reilly ([id number]) -------------------------------------------------------------------------------- Fall Risk Assessment Details Patient Name: John Reilly. Date of Service: 06/17/2020 9:30 AM Medical Record Number: [id number] Patient Account Number: [id number] Date of Birth/Sex: 1955-06-18  (65 y.o. M) Treating RN: Dolan Amen Primary Care Marcea Rojek: Ramonita Lab Other Clinician: Referring Meekah Math: Caroline More Treating Chelcea Zahn/Extender: Skipper Cliche in Treatment: 0 Fall Risk Assessment Items Have you had 2 or more falls in the last 12 monthso 0 No Have you had any fall that resulted in injury in the last 12 monthso 0 No FALLS RISK SCREEN History of falling - immediate or within 3 months 0 No Secondary diagnosis (Do you have 2 or more medical diagnoseso) 0 No Ambulatory aid None/bed rest/wheelchair/nurse 0 Yes Crutches/cane/walker 0 No Furniture 0 No Intravenous therapy Access/Saline/Heparin Lock 0 No Gait/Transferring Normal/ bed rest/  wheelchair 0 Yes Weak (short steps with or without shuffle, stooped but able to lift head while walking, may 0 No seek support from furniture) Impaired (short steps with shuffle, may have difficulty arising from chair, head down, impaired 0 No balance) Mental Status Oriented to own ability 0 Yes Electronic Signature(s) Signed: 06/17/2020 10:53:21 AM By: Georges Mouse, Minus Breeding RN Entered By: Georges Mouse, Kenia on 06/17/2020 10:15:55 John Reilly ([id number]) -------------------------------------------------------------------------------- Foot Assessment Details Patient Name: John Reilly. Date of Service: 06/17/2020 9:30 AM Medical Record Number: [id number] Patient Account Number: [id number] Date of Birth/Sex: [birth year] (65 y.o. M) Treating RN: Dolan Amen Primary Care Dhilan Brauer: Ramonita Lab Other Clinician: Referring Anthea Udovich: Caroline More Treating Librado Guandique/Extender: Skipper Cliche in Treatment: 0 Foot Assessment Items Site Locations + = Sensation present, - = Sensation absent, C = Callus, U = Ulcer R = Redness, W = Warmth, M = Maceration, PU = Pre-ulcerative lesion F = Fissure, S = Swelling, D = Dryness Assessment Right: Left: Other Deformity: No No Prior Foot Ulcer: No No Prior Amputation: No  No Charcot Joint: No No Ambulatory Status: Ambulatory Without Help Gait: Steady Electronic Signature(s) Signed: 06/17/2020 10:53:21 AM By: Georges Mouse, Minus Breeding RN Entered By: Georges Mouse, Minus Breeding on 06/17/2020 10:24:31 John Reilly ([id number]) -------------------------------------------------------------------------------- Nutrition Risk Screening Details Patient Name: John Reilly. Date of Service: 06/17/2020 9:30 AM Medical Record Number: [id number] Patient Account Number: [id number] Date of Birth/Sex: Jun 18, 1955 (65 y.o. M) Treating RN: Dolan Amen Primary Care Lalisa Kiehn: Ramonita Lab Other Clinician: Referring Talecia Sherlin: Caroline More Treating Hermes Wafer/Extender: Jeri Cos Weeks in Treatment: 0 Height (in): 70 Weight (lbs): 163 Body Mass Index (BMI): 23.4 Nutrition Risk Screening Items Score Screening NUTRITION RISK SCREEN: I have an illness or condition that made me change the kind and/or amount of food I eat 0 No I eat fewer than two meals per day 0 No I eat few fruits and vegetables, or milk products 0 No I have three or more drinks of beer, liquor or wine almost every day 0 No I have tooth or mouth problems that make it hard for me to eat 0 No I don't always have enough money to buy the food I need 0 No I eat alone most of the time 0 No I take three or more different prescribed or over-the-counter drugs a day 1 Yes Without wanting to, I have lost or gained 10 pounds in the last six months 0 No I am not always physically able to shop, cook and/or feed myself 0 No Nutrition Protocols Good Risk Protocol 0 No interventions needed Moderate Risk Protocol High Risk Proctocol Risk Level: Good Risk Score: 1 Electronic Signature(s) Signed: 06/17/2020 10:53:21 AM By: Georges Mouse, Minus Breeding RN Entered By: Georges Mouse, Minus Breeding on 06/17/2020 10:16:12

## 2020-06-18 ENCOUNTER — Encounter (INDEPENDENT_AMBULATORY_CARE_PROVIDER_SITE_OTHER): Payer: Self-pay | Admitting: Nurse Practitioner

## 2020-06-18 ENCOUNTER — Ambulatory Visit (INDEPENDENT_AMBULATORY_CARE_PROVIDER_SITE_OTHER): Payer: Medicare Other | Admitting: Nurse Practitioner

## 2020-06-18 VITALS — BP 102/63 | HR 94 | Resp 16 | Wt 162.2 lb

## 2020-06-18 DIAGNOSIS — I872 Venous insufficiency (chronic) (peripheral): Secondary | ICD-10-CM

## 2020-06-18 DIAGNOSIS — E1159 Type 2 diabetes mellitus with other circulatory complications: Secondary | ICD-10-CM | POA: Diagnosis not present

## 2020-06-19 NOTE — Progress Notes (Signed)
John, Reilly ([id number]) Visit Report for 06/17/2020 Chief Complaint Document Details Patient Name: John Reilly, John Reilly. Date of Service: 06/17/2020 9:30 AM Medical Record Number: [id number] Patient Account Number: [id number] Date of Birth/Sex: 1955/07/16 (65 y.o. M) Treating RN: Carlene Coria Primary Care Provider: Ramonita Lab Other Clinician: Referring Provider: Caroline More Treating Provider/Extender: Skipper Cliche in Treatment: 0 Information Obtained from: Patient Chief Complaint Bilateral LE Lymphedema Electronic Signature(s) Signed: 06/17/2020 10:35:01 AM By: Worthy Keeler PA-C Entered By: Worthy Keeler on 06/17/2020 10:35:01 John Reilly ([id number]) -------------------------------------------------------------------------------- HPI Details Patient Name: John Reilly. Date of Service: 06/17/2020 9:30 AM Medical Record Number: [id number] Patient Account Number: [id number] Date of Birth/Sex: 11-24-1955 (65 y.o. M) Treating RN: Carlene Coria Primary Care Provider: Ramonita Lab Other Clinician: Referring Provider: Caroline More Treating Provider/Extender: Skipper Cliche in Treatment: 0 History of Present Illness HPI Description: 06/17/2020 upon evaluation today patient appears to be doing somewhat poorly in regard to his legs with regard to swelling. He tells me that he does sleep in a chair but he does elevate his legs quite a bit using a pillow to get him up quite high. That is good news. With that being said he has not been wearing his compression stockings even on his left leg which is not currently weeping just due to the fact that to be honest he tells me that it was something that he did not really think much about and he does not know that he is ever been told to wear them every day. Nonetheless I do think this is something he should be wearing every day we discussed that today in general. Also think that he needs for now to have something else to help with compression of  the right leg. He will be seeing vascular tomorrow some not can actually wrap and will just use Tubigrip for the time being. That way this can be removed if need be. The patient does have a history of type 1 diabetes mellitus with a hemoglobin A1c of 6.6 on 03/21/2020. He also does tell me that he has Parkinson's disease which complicates things to some degree as well. Fortunately there is no signs of active infection at this time although he did recently complete Cipro today. There is no signs of obvious infection over the leg currently although there is some erythema I believe this may be more due to the swelling in the way the leg was wrapped with the Ace wrap. Electronic Signature(s) Signed: 06/17/2020 10:46:26 AM By: Worthy Keeler PA-C Entered By: Worthy Keeler on 06/17/2020 10:46:26 ZYON, ROSSER ([id number]) -------------------------------------------------------------------------------- Physical Exam Details Patient Name: John, Reilly. Date of Service: 06/17/2020 9:30 AM Medical Record Number: [id number] Patient Account Number: [id number] Date of Birth/Sex: April 23, 1956 (65 y.o. M) Treating RN: Carlene Coria Primary Care Provider: Ramonita Lab Other Clinician: Referring Provider: Caroline More Treating Provider/Extender: Skipper Cliche in Treatment: 0 Constitutional sitting or standing blood pressure is within target range for patient.. pulse regular and within target range for patient.Marland Kitchen respirations regular, non- labored and within target range for patient.Marland Kitchen temperature within target range for patient.. Well-nourished and well-hydrated in no acute distress. Eyes conjunctiva clear no eyelid edema noted. pupils equal round and reactive to light and accommodation. Ears, Nose, Mouth, and Throat no gross abnormality of ear auricles or external auditory canals. normal hearing noted during conversation. mucus membranes moist. Respiratory normal breathing without  difficulty. Cardiovascular 2+ dorsalis pedis/posterior tibialis pulses. 2+ pitting edema of the  bilateral lower extremities. Musculoskeletal normal gait and posture. no significant deformity or arthritic changes, no loss or range of motion, no clubbing. Psychiatric this patient is able to make decisions and demonstrates good insight into disease process. Alert and Oriented x 3. pleasant and cooperative. Notes Upon inspection patient appears to be doing decently well with regard to her wounds I do not see any open wounds at this point which is great news. No fevers, chills, nausea, vomiting, or diarrhea. With that being said he does have quite a bit of edema which is 1-2+ pitting bilaterally. He does seem to have pretty good pulses in my opinion. With that being said I do think that his edema is definitely something that could be controlled with good compression stocking. Electronic Signature(s) Signed: 06/17/2020 10:47:10 AM By: Worthy Keeler PA-C Entered By: Worthy Keeler on 06/17/2020 10:47:09 JAS, BETTEN ([id number]) -------------------------------------------------------------------------------- Physician Orders Details Patient Name: John, Reilly. Date of Service: 06/17/2020 9:30 AM Medical Record Number: [id number] Patient Account Number: [id number] Date of Birth/Sex: 1955/07/06 (65 y.o. M) Treating RN: Carlene Coria Primary Care Provider: Ramonita Lab Other Clinician: Referring Provider: Caroline More Treating Provider/Extender: Skipper Cliche in Treatment: 0 Verbal / Phone Orders: No Diagnosis Coding ICD-10 Coding Code Description I89.0 Lymphedema, not elsewhere classified I87.2 Venous insufficiency (chronic) (peripheral) E10.621 Type 1 diabetes mellitus with foot ulcer Follow-up Appointments o Return Appointment in 1 week. Edema Control - Lymphedema / Segmental Compressive Device / Other o Tubigrip single layer applied. - to right lower leg size D o Patient to  wear own compression stockings. Remove compression stockings every night before going to bed and put on every morning when getting up. - left Non-Wound Condition o Additional non-wound orders/instructions: - apply abd pad to dorsal and secure with kerlix Electronic Signature(s) Signed: 06/17/2020 4:46:05 PM By: Worthy Keeler PA-C Signed: 06/19/2020 9:44:48 AM By: Carlene Coria RN Entered By: Carlene Coria on 06/17/2020 10:45:10 John Reilly ([id number]) -------------------------------------------------------------------------------- Problem List Details Patient Name: MESSIYAH, WATERSON. Date of Service: 06/17/2020 9:30 AM Medical Record Number: [id number] Patient Account Number: [id number] Date of Birth/Sex: 19-Nov-1955 (65 y.o. M) Treating RN: Carlene Coria Primary Care Provider: Ramonita Lab Other Clinician: Referring Provider: Caroline More Treating Provider/Extender: Skipper Cliche in Treatment: 0 Active Problems ICD-10 Encounter Code Description Active Date MDM Diagnosis I89.0 Lymphedema, not elsewhere classified 06/17/2020 No Yes I87.2 Venous insufficiency (chronic) (peripheral) 06/17/2020 No Yes E10.621 Type 1 diabetes mellitus with foot ulcer 06/17/2020 No Yes G20 Parkinson's disease 06/17/2020 No Yes Inactive Problems Resolved Problems Electronic Signature(s) Signed: 06/17/2020 10:45:49 AM By: Worthy Keeler PA-C Previous Signature: 06/17/2020 10:34:42 AM Version By: Worthy Keeler PA-C Entered By: Worthy Keeler on 06/17/2020 10:45:49 John Reilly ([id number]) -------------------------------------------------------------------------------- Progress Note Details Patient Name: John Reilly. Date of Service: 06/17/2020 9:30 AM Medical Record Number: [id number] Patient Account Number: [id number] Date of Birth/Sex: Dec 04, 1955 (65 y.o. M) Treating RN: Carlene Coria Primary Care Provider: Ramonita Lab Other Clinician: Referring Provider: Caroline More Treating Provider/Extender: Skipper Cliche in Treatment: 0 Subjective Chief Complaint Information obtained from Patient Bilateral LE Lymphedema History of Present Illness (HPI) 06/17/2020 upon evaluation today patient appears to be doing somewhat poorly in regard to his legs with regard to swelling. He tells me that he does sleep in a chair but he does elevate his legs quite a bit using a pillow to get him up quite high. That is good news. With that being said he has not  been wearing his compression stockings even on his left leg which is not currently weeping just due to the fact that to be honest he tells me that it was something that he did not really think much about and he does not know that he is ever been told to wear them every day. Nonetheless I do think this is something he should be wearing every day we discussed that today in general. Also think that he needs for now to have something else to help with compression of the right leg. He will be seeing vascular tomorrow some not can actually wrap and will just use Tubigrip for the time being. That way this can be removed if need be. The patient does have a history of type 1 diabetes mellitus with a hemoglobin A1c of 6.6 on 03/21/2020. He also does tell me that he has Parkinson's disease which complicates things to some degree as well. Fortunately there is no signs of active infection at this time although he did recently complete Cipro today. There is no signs of obvious infection over the leg currently although there is some erythema I believe this may be more due to the swelling in the way the leg was wrapped with the Ace wrap. Patient History Information obtained from Patient. Allergies penicillin, Sulfa (Sulfonamide Antibiotics) Social History Former smoker, Marital Status - Married, Alcohol Use - Moderate, Drug Use - No History, Caffeine Use - Rarely. Medical History Eyes Denies history of Cataracts, Glaucoma, Optic Neuritis Ear/Nose/Mouth/Throat Denies  history of Chronic sinus problems/congestion, Middle ear problems Hematologic/Lymphatic Denies history of Anemia, Hemophilia, Human Immunodeficiency Virus, Lymphedema, Sickle Cell Disease Respiratory Denies history of Aspiration, Asthma, Chronic Obstructive Pulmonary Disease (COPD), Pneumothorax, Sleep Apnea, Tuberculosis Cardiovascular Patient has history of Hypertension Denies history of Angina, Arrhythmia, Coronary Artery Disease, Deep Vein Thrombosis, Hypotension, Myocardial Infarction, Peripheral Arterial Disease, Peripheral Venous Disease, Phlebitis, Vasculitis Gastrointestinal Denies history of Cirrhosis , Colitis, Crohn s, Hepatitis A, Hepatitis B, Hepatitis C Endocrine Patient has history of Type I Diabetes Genitourinary Denies history of End Stage Renal Disease Immunological Denies history of Lupus Erythematosus, Raynaud s, Scleroderma Integumentary (Skin) Denies history of History of Burn, History of pressure wounds Musculoskeletal Denies history of Gout, Rheumatoid Arthritis, Osteoarthritis, Osteomyelitis Neurologic Denies history of Dementia, Neuropathy, Quadriplegia, Paraplegia, Seizure Disorder Oncologic Denies history of Received Chemotherapy, Received Radiation Psychiatric Denies history of Anorexia/bulimia, Confinement Anxiety Patient is treated with Insulin. Blood sugar is tested. Review of Systems (ROS) Constitutional Symptoms (General Health) Denies complaints or symptoms of Fatigue, Fever, Chills, Marked Weight Change. WINFORD, HEHN. ([id number]) Eyes Complains or has symptoms of Glasses / Contacts. Denies complaints or symptoms of Dry Eyes, Vision Changes. Ear/Nose/Mouth/Throat Denies complaints or symptoms of Difficult clearing ears, Sinusitis. Hematologic/Lymphatic Denies complaints or symptoms of Bleeding / Clotting Disorders, Human Immunodeficiency Virus. Respiratory Denies complaints or symptoms of Chronic or frequent coughs, Shortness of  Breath. Cardiovascular Complains or has symptoms of LE edema. Denies complaints or symptoms of Chest pain. Gastrointestinal Denies complaints or symptoms of Frequent diarrhea, Nausea, Vomiting. Endocrine Denies complaints or symptoms of Hepatitis, Thyroid disease, Polydypsia (Excessive Thirst). Genitourinary Denies complaints or symptoms of Kidney failure/ Dialysis, Incontinence/dribbling. Immunological Denies complaints or symptoms of Hives, Itching. Integumentary (Skin) Complains or has symptoms of Wounds, Swelling. Denies complaints or symptoms of Bleeding or bruising tendency, Breakdown. Musculoskeletal Denies complaints or symptoms of Muscle Pain, Muscle Weakness. Neurologic Denies complaints or symptoms of Numbness/parasthesias, Focal/Weakness. Psychiatric Denies complaints or symptoms of Anxiety, Claustrophobia. Objective Constitutional sitting or  standing blood pressure is within target range for patient.. pulse regular and within target range for patient.Marland Kitchen respirations regular, non- labored and within target range for patient.Marland Kitchen temperature within target range for patient.. Well-nourished and well-hydrated in no acute distress. Vitals Time Taken: 9:53 AM, Height: 70 in, Source: Stated, Weight: 163 lbs, Source: Measured, BMI: 23.4, Temperature: 97.7 F, Pulse: 82 bpm, Respiratory Rate: 18 breaths/min, Blood Pressure: 110/66 mmHg. Eyes conjunctiva clear no eyelid edema noted. pupils equal round and reactive to light and accommodation. Ears, Nose, Mouth, and Throat no gross abnormality of ear auricles or external auditory canals. normal hearing noted during conversation. mucus membranes moist. Respiratory normal breathing without difficulty. Cardiovascular 2+ dorsalis pedis/posterior tibialis pulses. 2+ pitting edema of the bilateral lower extremities. Musculoskeletal normal gait and posture. no significant deformity or arthritic changes, no loss or range of motion, no  clubbing. Psychiatric this patient is able to make decisions and demonstrates good insight into disease process. Alert and Oriented x 3. pleasant and cooperative. General Notes: Upon inspection patient appears to be doing decently well with regard to her wounds I do not see any open wounds at this point which is great news. No fevers, chills, nausea, vomiting, or diarrhea. With that being said he does have quite a bit of edema which is 1-2+ pitting bilaterally. He does seem to have pretty good pulses in my opinion. With that being said I do think that his edema is definitely something that could be controlled with good compression stocking. Assessment ANTWONE, CAPOZZOLI ([id number]) Active Problems ICD-10 Lymphedema, not elsewhere classified Venous insufficiency (chronic) (peripheral) Type 1 diabetes mellitus with foot ulcer Parkinson's disease Plan Follow-up Appointments: Return Appointment in 1 week. Edema Control - Lymphedema / Segmental Compressive Device / Other: Tubigrip single layer applied. - to right lower leg size D Patient to wear own compression stockings. Remove compression stockings every night before going to bed and put on every morning when getting up. - left Non-Wound Condition: Additional non-wound orders/instructions: - apply abd pad to dorsal and secure with kerlix 1. I would recommend currently that we actually go ahead and initiate treatment measures currently for today to help with the lymphedema. I Minna recommend that we use an ABD pad over the foot discharge from the dorsal to the lateral aspect just in case there is any drainage. 2. Muscle can recommend that we secure this with roll gauze and then subsequently apply Tubigrip over top to hold in place and help with some compression for now. 3. He is having vascular evaluation tomorrow I am not exactly sure what test is being performed but depending on how that goes to make any adjustments as necessary. We will see  patient back for reevaluation in 1 week here in the clinic. If anything worsens or changes patient will contact our office for additional recommendations. Electronic Signature(s) Signed: 06/17/2020 10:48:12 AM By: Worthy Keeler PA-C Entered By: Worthy Keeler on 06/17/2020 10:48:12 HOMMER, CUNLIFFE ([id number]) -------------------------------------------------------------------------------- ROS/PFSH Details Patient Name: RONIE, BARNHART. Date of Service: 06/17/2020 9:30 AM Medical Record Number: [id number] Patient Account Number: [id number] Date of Birth/Sex: [birth year] (65 y.o. M) Treating RN: Dolan Amen Primary Care Provider: Ramonita Lab Other Clinician: Referring Provider: Caroline More Treating Provider/Extender: Skipper Cliche in Treatment: 0 Information Obtained From Patient Constitutional Symptoms (General Health) Complaints and Symptoms: Negative for: Fatigue; Fever; Chills; Marked Weight Change Eyes Complaints and Symptoms: Positive for: Glasses / Contacts Negative for: Dry Eyes; Vision Changes Medical History: Negative for:  Cataracts; Glaucoma; Optic Neuritis Ear/Nose/Mouth/Throat Complaints and Symptoms: Negative for: Difficult clearing ears; Sinusitis Medical History: Negative for: Chronic sinus problems/congestion; Middle ear problems Hematologic/Lymphatic Complaints and Symptoms: Negative for: Bleeding / Clotting Disorders; Human Immunodeficiency Virus Medical History: Negative for: Anemia; Hemophilia; Human Immunodeficiency Virus; Lymphedema; Sickle Cell Disease Respiratory Complaints and Symptoms: Negative for: Chronic or frequent coughs; Shortness of Breath Medical History: Negative for: Aspiration; Asthma; Chronic Obstructive Pulmonary Disease (COPD); Pneumothorax; Sleep Apnea; Tuberculosis Cardiovascular Complaints and Symptoms: Positive for: LE edema Negative for: Chest pain Medical History: Positive for: Hypertension Negative for: Angina;  Arrhythmia; Coronary Artery Disease; Deep Vein Thrombosis; Hypotension; Myocardial Infarction; Peripheral Arterial Disease; Peripheral Venous Disease; Phlebitis; Vasculitis Gastrointestinal Complaints and Symptoms: Negative for: Frequent diarrhea; Nausea; Vomiting Medical History: Negative for: Cirrhosis ; Colitis; Crohnos; Hepatitis A; Hepatitis B; Hepatitis C Endocrine DAMON, HARGROVE. ([id number]) Complaints and Symptoms: Negative for: Hepatitis; Thyroid disease; Polydypsia (Excessive Thirst) Medical History: Positive for: Type I Diabetes Time with diabetes: 30 years Treated with: Insulin Blood sugar tested every day: Yes Tested : pump Genitourinary Complaints and Symptoms: Negative for: Kidney failure/ Dialysis; Incontinence/dribbling Medical History: Negative for: End Stage Renal Disease Immunological Complaints and Symptoms: Negative for: Hives; Itching Medical History: Negative for: Lupus Erythematosus; Raynaudos; Scleroderma Integumentary (Skin) Complaints and Symptoms: Positive for: Wounds; Swelling Negative for: Bleeding or bruising tendency; Breakdown Medical History: Negative for: History of Burn; History of pressure wounds Musculoskeletal Complaints and Symptoms: Negative for: Muscle Pain; Muscle Weakness Medical History: Negative for: Gout; Rheumatoid Arthritis; Osteoarthritis; Osteomyelitis Neurologic Complaints and Symptoms: Negative for: Numbness/parasthesias; Focal/Weakness Medical History: Negative for: Dementia; Neuropathy; Quadriplegia; Paraplegia; Seizure Disorder Psychiatric Complaints and Symptoms: Negative for: Anxiety; Claustrophobia Medical History: Negative for: Anorexia/bulimia; Confinement Anxiety Oncologic Medical History: Negative for: Received Chemotherapy; Received Radiation Immunizations Pneumococcal Vaccine: Received Pneumococcal Vaccination: Yes Implantable Devices None ASHOK, SAWAYA ([id number]) Family and Social  History Former smoker; Marital Status - Married; Alcohol Use: Moderate; Drug Use: No History; Caffeine Use: Rarely Electronic Signature(s) Signed: 06/17/2020 10:53:21 AM By: Georges Mouse, Minus Breeding RN Signed: 06/17/2020 4:46:05 PM By: Worthy Keeler PA-C Entered By: Georges Mouse, Minus Breeding on 06/17/2020 10:14:23 CESARE, SUMLIN ([id number]) -------------------------------------------------------------------------------- SuperBill Details Patient Name: John Reilly. Date of Service: 06/17/2020 Medical Record Number: [id number] Patient Account Number: [id number] Date of Birth/Sex: [birth year] (65 y.o. M) Treating RN: Carlene Coria Primary Care Provider: Ramonita Lab Other Clinician: Referring Provider: Caroline More Treating Provider/Extender: Skipper Cliche in Treatment: 0 Diagnosis Coding ICD-10 Codes Code Description I89.0 Lymphedema, not elsewhere classified I87.2 Venous insufficiency (chronic) (peripheral) E10.621 Type 1 diabetes mellitus with foot ulcer G20 Parkinson's disease Facility Procedures CPT4 Code: 23536144 Description: 99213 - WOUND CARE VISIT-LEV 3 EST PT Modifier: Quantity: 1 Physician Procedures CPT4 Code: [id number] Description: WC PHYS LEVEL 3 o NEW PT Modifier: Quantity: 1 CPT4 Code: Description: ICD-10 Diagnosis Description I89.0 Lymphedema, not elsewhere classified I87.2 Venous insufficiency (chronic) (peripheral) E10.621 Type 1 diabetes mellitus with foot ulcer G20 Parkinson's disease Modifier: Quantity: Electronic Signature(s) Signed: 06/17/2020 10:48:56 AM By: Worthy Keeler PA-C Entered By: Worthy Keeler on 06/17/2020 10:48:56

## 2020-06-19 NOTE — Progress Notes (Signed)
ITALO, BANTON ([id number]) Visit Report for 06/17/2020 Allergy List Details Patient Name: John Reilly, John Reilly. Date of Service: 06/17/2020 9:30 AM Medical Record Number: [id number] Patient Account Number: [id number] Date of Birth/Sex: [birth year] (65 y.o. M) Treating RN: Dolan Amen Primary Care Careem Yasui: Ramonita Lab Other Clinician: Referring Talita Recht: Caroline More Treating Sky Primo/Extender: Jeri Cos Weeks in Treatment: 0 Allergies Active Allergies penicillin Type: Food Sulfa (Sulfonamide Antibiotics) Type: Allergen Allergy Notes Electronic Signature(s) Signed: 06/17/2020 10:53:21 AM By: Georges Mouse, Minus Breeding RN Entered By: Georges Mouse, Minus Breeding on 06/17/2020 10:06:27 John Reilly ([id number]) -------------------------------------------------------------------------------- Arrival Information Details Patient Name: John Reilly, John Reilly. Date of Service: 06/17/2020 9:30 AM Medical Record Number: [id number] Patient Account Number: [id number] Date of Birth/Sex: 21-May-1955 (65 y.o. M) Treating RN: Dolan Amen Primary Care Naythen Heikkila: Ramonita Lab Other Clinician: Referring Ticia Virgo: Caroline More Treating Lavere Stork/Extender: Skipper Cliche in Treatment: 0 Visit Information Patient Arrived: Ambulatory Arrival Time: 09:50 Accompanied By: spouse Transfer Assistance: None Patient Identification Verified: Yes Secondary Verification Process Completed: Yes Electronic Signature(s) Signed: 06/17/2020 10:53:21 AM By: Georges Mouse, Minus Breeding RN Entered By: Georges Mouse, Minus Breeding on 06/17/2020 09:51:12 John Reilly ([id number]) -------------------------------------------------------------------------------- Clinic Level of Care Assessment Details Patient Name: John Reilly, John Reilly. Date of Service: 06/17/2020 9:30 AM Medical Record Number: [id number] Patient Account Number: [id number] Date of Birth/Sex: [birth year] (65 y.o. M) Treating RN: Carlene Coria Primary Care Adiyah Lame: Ramonita Lab Other  Clinician: Referring Nguyen Todorov: Caroline More Treating Jedidiah Demartini/Extender: Skipper Cliche in Treatment: 0 Clinic Level of Care Assessment Items TOOL 2 Quantity Score X - Use when only an EandM is performed on the INITIAL visit 1 0 ASSESSMENTS - Nursing Assessment / Reassessment X - General Physical Exam (combine w/ comprehensive assessment (listed just below) when performed on new 1 20 pt. evals) X- 1 25 Comprehensive Assessment (HX, ROS, Risk Assessments, Wounds Hx, etc.) ASSESSMENTS - Wound and Skin Assessment / Reassessment []  - Simple Wound Assessment / Reassessment - one wound 0 []  - 0 Complex Wound Assessment / Reassessment - multiple wounds []  - 0 Dermatologic / Skin Assessment (not related to wound area) ASSESSMENTS - Ostomy and/or Continence Assessment and Care []  - Incontinence Assessment and Management 0 []  - 0 Ostomy Care Assessment and Management (repouching, etc.) PROCESS - Coordination of Care X - Simple Patient / Family Education for ongoing care 1 15 []  - 0 Complex (extensive) Patient / Family Education for ongoing care X- 1 10 Staff obtains Programmer, systems, Records, Test Results / Process Orders []  - 0 Staff telephones HHA, Nursing Homes / Clarify orders / etc []  - 0 Routine Transfer to another Facility (non-emergent condition) []  - 0 Routine Hospital Admission (non-emergent condition) X- 1 15 New Admissions / Biomedical engineer / Ordering NPWT, Apligraf, etc. []  - 0 Emergency Hospital Admission (emergent condition) X- 1 10 Simple Discharge Coordination []  - 0 Complex (extensive) Discharge Coordination PROCESS - Special Needs []  - Pediatric / Minor Patient Management 0 []  - 0 Isolation Patient Management []  - 0 Hearing / Language / Visual special needs []  - 0 Assessment of Community assistance (transportation, D/C planning, etc.) []  - 0 Additional assistance / Altered mentation []  - 0 Support Surface(s) Assessment (bed, cushion, seat,  etc.) INTERVENTIONS - Wound Cleansing / Measurement X - Wound Imaging (photographs - any number of wounds) 1 5 []  - 0 Wound Tracing (instead of photographs) []  - 0 Simple Wound Measurement - one wound []  - 0 Complex Wound Measurement - multiple wounds EBEN, CHOINSKI. ([id number]) []  - 0 Simple Wound Cleansing -  one wound []  - 0 Complex Wound Cleansing - multiple wounds INTERVENTIONS - Wound Dressings []  - Small Wound Dressing one or multiple wounds 0 []  - 0 Medium Wound Dressing one or multiple wounds []  - 0 Large Wound Dressing one or multiple wounds []  - 0 Application of Medications - injection INTERVENTIONS - Miscellaneous []  - External ear exam 0 []  - 0 Specimen Collection (cultures, biopsies, blood, body fluids, etc.) []  - 0 Specimen(s) / Culture(s) sent or taken to Lab for analysis []  - 0 Patient Transfer (multiple staff / Harrel Lemon Lift / Similar devices) []  - 0 Simple Staple / Suture removal (25 or less) []  - 0 Complex Staple / Suture removal (26 or more) []  - 0 Hypo / Hyperglycemic Management (close monitor of Blood Glucose) X- 1 15 Ankle / Brachial Index (ABI) - do not check if billed separately Has the patient been seen at the hospital within the last three years: Yes Total Score: 115 Level Of Care: New/Established - Level 3 Electronic Signature(s) Signed: 06/19/2020 9:44:48 AM By: Carlene Coria RN Entered By: Carlene Coria on 06/17/2020 10:47:50 John Reilly ([id number]) -------------------------------------------------------------------------------- Lower Extremity Assessment Details Patient Name: John Reilly. Date of Service: 06/17/2020 9:30 AM Medical Record Number: [id number] Patient Account Number: [id number] Date of Birth/Sex: July 31, 1955 (65 y.o. M) Treating RN: Dolan Amen Primary Care Mildred Tuccillo: Ramonita Lab Other Clinician: Referring Camesha Farooq: Caroline More Treating Adelma Bowdoin/Extender: Jeri Cos Weeks in Treatment: 0 Edema Assessment Assessed:  Shirlyn Goltz: Yes] [Right: Yes] Edema: [Left: Yes] [Right: Yes] Calf Left: Right: Point of Measurement: 27 cm From Medial Instep 34.2 cm 29.8 cm Ankle Left: Right: Point of Measurement: 8 cm From Medial Instep 26.5 cm 27.5 cm Knee To Floor Left: Right: From Medial Instep 49 cm Vascular Assessment Pulses: Dorsalis Pedis Palpable: [Left:Yes] [Right:Yes] Doppler Audible: [Left:Yes] [Right:Yes] Posterior Tibial Palpable: [Left:No] [Right:No] Doppler Audible: [Left:Yes] [Right:Yes] Blood Pressure: Brachial: [Right:118] Dorsalis Pedis: 104 [Left:Dorsalis Pedis: 782] Ankle: Posterior Tibial: 162 [Left:Posterior Tibial: 162 1.37] [Right:1.37] Electronic Signature(s) Signed: 06/17/2020 10:53:21 AM By: Georges Mouse, Minus Breeding RN Entered By: Georges Mouse, Minus Breeding on 06/17/2020 10:26:24 John Reilly ([id number]) -------------------------------------------------------------------------------- Multi Wound Chart Details Patient Name: John Reilly. Date of Service: 06/17/2020 9:30 AM Medical Record Number: [id number] Patient Account Number: [id number] Date of Birth/Sex: 11-18-1955 (65 y.o. M) Treating RN: Carlene Coria Primary Care Kenneth Lax: Ramonita Lab Other Clinician: Referring Renny Remer: Caroline More Treating Cendy Oconnor/Extender: Skipper Cliche in Treatment: 0 Vital Signs Height(in): 70 Pulse(bpm): 82 Weight(lbs): 163 Blood Pressure(mmHg): 110/66 Body Mass Index(BMI): 23 Temperature(F): 97.7 Respiratory Rate(breaths/min): 18 Wound Assessments Treatment Notes Electronic Signature(s) Signed: 06/19/2020 9:44:48 AM By: Carlene Coria RN Entered By: Carlene Coria on 06/17/2020 10:37:45 John Reilly ([id number]) -------------------------------------------------------------------------------- [place] Details Patient Name: John Reilly, John Reilly. Date of Service: 06/17/2020 9:30 AM Medical Record Number: [id number] Patient Account Number: [id number] Date of Birth/Sex: [birth year]  (65 y.o. M) Treating RN: Carlene Coria Primary Care Braven Wolk: Ramonita Lab Other Clinician: Referring Consepcion Utt: Caroline More Treating Octavia Velador/Extender: Skipper Cliche in Treatment: 0 Active Inactive Wound/Skin Impairment Nursing Diagnoses: Knowledge deficit related to ulceration/compromised skin integrity Goals: Patient/caregiver will verbalize understanding of skin care regimen Date Initiated: 06/17/2020 Target Resolution Date: 07/15/2020 Goal Status: Active Ulcer/skin breakdown will have a volume reduction of 30% by week 4 Date Initiated: 06/17/2020 Target Resolution Date: 07/15/2020 Goal Status: Active Ulcer/skin breakdown will have a volume reduction of 50% by week 8 Date Initiated: 06/17/2020 Target Resolution Date: 08/15/2020 Goal Status: Active Ulcer/skin breakdown will have a volume reduction  of 80% by week 12 Date Initiated: 06/17/2020 Target Resolution Date: 09/14/2020 Goal Status: Active Ulcer/skin breakdown will heal within 14 weeks Date Initiated: 06/17/2020 Target Resolution Date: 10/15/2020 Goal Status: Active Interventions: Assess patient/caregiver ability to obtain necessary supplies Assess patient/caregiver ability to perform ulcer/skin care regimen upon admission and as needed Assess ulceration(s) every visit Notes: Electronic Signature(s) Signed: 06/19/2020 9:44:48 AM By: Carlene Coria RN Entered By: Carlene Coria on 06/17/2020 10:37:23 John Reilly ([id number]) -------------------------------------------------------------------------------- Pain Assessment Details Patient Name: John Reilly. Date of Service: 06/17/2020 9:30 AM Medical Record Number: [id number] Patient Account Number: [id number] Date of Birth/Sex: [birth year] (65 y.o. M) Treating RN: Dolan Amen Primary Care Daray Polgar: Ramonita Lab Other Clinician: Referring Kayce Betty: Caroline More Treating Zeppelin Beckstrand/Extender: Skipper Cliche in Treatment: 0 Active Problems Location of Pain Severity and Description  of Pain Patient Has Paino No Site Locations Rate the pain. Current Pain Level: 0 Pain Management and Medication Current Pain Management: Electronic Signature(s) Signed: 06/17/2020 10:53:21 AM By: Georges Mouse, Minus Breeding RN Entered By: Georges Mouse, Kenia on 06/17/2020 09:51:29 John Reilly ([id number]) -------------------------------------------------------------------------------- Patient/Caregiver Education Details Patient Name: John Reilly. Date of Service: 06/17/2020 9:30 AM Medical Record Number: [id number] Patient Account Number: [id number] Date of Birth/Gender: [birth year] (65 y.o. M) Treating RN: Carlene Coria Primary Care Physician: Ramonita Lab Other Clinician: Referring Physician: Caroline More Treating Physician/Extender: Skipper Cliche in Treatment: 0 Education Assessment Education Provided To: Patient Education Topics Provided Wound/Skin Impairment: Methods: Explain/Verbal Responses: State content correctly Electronic Signature(s) Signed: 06/19/2020 9:44:48 AM By: Carlene Coria RN Entered By: Carlene Coria on 06/17/2020 10:48:21 John Reilly ([id number]) -------------------------------------------------------------------------------- [place] Details Patient Name: John Reilly. Date of Service: 06/17/2020 9:30 AM Medical Record Number: [id number] Patient Account Number: [id number] Date of Birth/Sex: 1955-11-24 (65 y.o. M) Treating RN: Dolan Amen Primary Care Marko Skalski: Ramonita Lab Other Clinician: Referring Albertine Lafoy: Caroline More Treating Kaelyn Innocent/Extender: Skipper Cliche in Treatment: 0 Vital Signs Time Taken: 09:53 Temperature (F): 97.7 Height (in): 70 Pulse (bpm): 82 Source: Stated Respiratory Rate (breaths/min): 18 Weight (lbs): 163 Blood Pressure (mmHg): 110/66 Source: Measured Reference Range: 80 - 120 mg / dl Body Mass Index (BMI): 23.4 Electronic Signature(s) Signed: 06/17/2020 10:53:21 AM By: Georges Mouse, Minus Breeding RN Entered By:  Georges Mouse, Minus Breeding on 06/17/2020 09:53:43

## 2020-06-23 ENCOUNTER — Encounter (INDEPENDENT_AMBULATORY_CARE_PROVIDER_SITE_OTHER): Payer: Self-pay | Admitting: Nurse Practitioner

## 2020-06-23 NOTE — Progress Notes (Signed)
Subjective:    Patient ID: John Reilly, male    DOB: Apr 18, 1956, 65 y.o.   MRN: [id number] Chief Complaint  Patient presents with  . New Patient (Initial Visit)    Ref Baker venous insufficiency    The patient presents for evaluation of swelling and redness near the right ankle area.  The patient has had a previous history of right ankle surgery with an external fixation device.  The patient's wife notes that the ankle initially had internal fixation but due to certain issues it needed a redo surgery.  Since this time the patient has had issues with swelling in this ankle area.  Is also noted the patient is a type I diabetic.  The patient has had previous issues with with cellulitis.  He is also subsequently developed a wound that is being treated by wound care.  Prior to this morning the patient has worn medical grade 1 compression stockings fairly regularly.  He also elevates his lower extremities regularly and notes that elevation does help with swelling of his leg and ankle.   Review of Systems  Cardiovascular: Positive for leg swelling.  Skin: Positive for wound.  All other systems reviewed and are negative.      Objective:   Physical Exam Vitals reviewed.  HENT:     Head: Normocephalic.  Cardiovascular:     Rate and Rhythm: Normal rate.     Pulses: Normal pulses.  Pulmonary:     Effort: Pulmonary effort is normal.  Neurological:     Mental Status: He is alert and oriented to person, place, and time.  Psychiatric:        Mood and Affect: Mood normal.        Behavior: Behavior normal.        Thought Content: Thought content normal.        Judgment: Judgment normal.     BP 102/63 (BP Location: Right Arm)   Pulse 94   Resp 16   Wt 162 lb 3.2 oz (73.6 kg)   BMI 23.95 kg/m   Past Medical History:  Diagnosis Date  . Cancer ([place])    Pt reports on 10/02/17 that he has never been dx with cancer  . COPD (chronic obstructive pulmonary disease) ([place])   . Diabetes  mellitus without complication ([place])   . GERD (gastroesophageal reflux disease)   . Hypertension   . Multiple duodenal ulcers   . Peripheral vascular disease ([facility])    Peripheral Neuropathy  . Retinopathy     Social History   Socioeconomic History  . Marital status: Married    Spouse name: Not on file  . Number of children: Not on file  . Years of education: Not on file  . Highest education level: Not on file  Occupational History  . Not on file  Tobacco Use  . Smoking status: Former Smoker    Quit date: 10/21/2005    Years since quitting: 14.6  . Smokeless tobacco: Never Used  Vaping Use  . Vaping Use: Never used  Substance and Sexual Activity  . Alcohol use: Yes    Alcohol/week: 42.0 standard drinks    Types: 42 Cans of beer per week    Comment: patient states he drinks 1 quart of beer per day  . Drug use: No  . Sexual activity: Not on file  Other Topics Concern  . Not on file  Social History Narrative  . Not on file   Social Determinants of Health  Financial Resource Strain: Not on file  Food Insecurity: Not on file  Transportation Needs: Not on file  Physical Activity: Not on file  Stress: Not on file  Social Connections: Not on file  Intimate Partner Violence: Not on file    Past Surgical History:  Procedure Laterality Date  . ANKLE ARTHROPLASTY     2013  . COLONOSCOPY WITH PROPOFOL N/A 10/12/2016   Procedure: COLONOSCOPY WITH PROPOFOL;  Surgeon: Manya Silvas, MD;  Location: [facility] ENDOSCOPY;  Service: Endoscopy;  Laterality: N/A;  . INTRAMEDULLARY (IM) NAIL INTERTROCHANTERIC Left 08/08/2018   Procedure: INTRAMEDULLARY (IM) NAIL INTERTROCHANTRIC;  Surgeon: Corky Mull, MD;  Location: [facility] ORS;  Service: Orthopedics;  Laterality: Left;    Family History  Problem Relation Age of Onset  . Stroke Mother   . Alzheimer's disease Father   . Diabetes Brother     Allergies  Allergen Reactions  . Penicillins Rash and Other (See Comments)    Has patient  had a PCN reaction causing immediate rash, facial/tongue/throat swelling, SOB or lightheadedness with hypotension: Yes Has patient had a PCN reaction causing severe rash involving mucus membranes or skin necrosis: No Has patient had a PCN reaction that required hospitalization: No Has patient had a PCN reaction occurring within the last 10 years: No If all of the above answers are "NO", then may proceed with Cephalosporin use. Other reaction(s): RASH Has patient had a PCN reaction causing immediate rash, facial/tongue/throat swelling, SOB or lightheadedness with hypotension: Yes Has patient had a PCN reaction causing severe rash involving mucus membranes or skin necrosis: No Has patient had a PCN reaction that required hospitalization: No Has patient had a PCN reaction occurring within the last 10 years: No If all of the above answers are "NO", then may proceed with Cephalosporin use. Reaction: Unknown; childhood reaction  Has patient had a PCN reaction causing immediate rash, facial/tongue/throat swelling, SOB or lightheadedness with hypotension: Yes Has patient had a PCN reaction causing severe rash involving mucus membranes or skin necrosis: No Has patient had a PCN reaction that required hospitalization: No Has patient had a PCN reaction occurring within the last 10 years: No If all of the above answers are "NO", then may proceed with Cephalosporin use.  . Sulfa Antibiotics Rash    Katherina Right Syndrome (SJS)     CBC Latest Ref Rng & Units 03/24/2020 03/23/2020 03/22/2020  WBC 4.0 - 10.5 K/uL 7.7 13.7(H) 9.4  Hemoglobin 13.0 - 17.0 g/dL 9.7(L) 9.5(L) 10.7(L)  Hematocrit 39.0 - 52.0 % 28.8(L) 28.1(L) 31.6(L)  Platelets 150 - 400 K/uL 282 282 320      CMP     Component Value Date/Time   NA 128 (L) 03/24/2020 0513   K 3.8 03/24/2020 0513   CL 99 03/24/2020 0513   CO2 21 (L) 03/24/2020 0513   GLUCOSE 251 (H) 03/24/2020 0513   BUN 11 03/24/2020 0513   CREATININE 0.97  03/24/2020 0513   CALCIUM 7.9 (L) 03/24/2020 0513   PROT 5.3 (L) 03/23/2020 0504   ALBUMIN 2.3 (L) 03/23/2020 0504   AST 12 (L) 03/23/2020 0504   ALT 5 03/23/2020 0504   ALKPHOS 60 03/23/2020 0504   BILITOT 0.8 03/23/2020 0504   GFRNONAA >60 03/24/2020 0513   GFRAA >60 08/15/2018 0407     No results found.     Assessment & Plan:   1. Venous (peripheral) insufficiency I have had a long discussion with the patient regarding swelling and why it  causes symptoms.  Patient will begin wearing graduated compression stockings class 1 (20-30 mmHg) on a daily basis a prescription was given. The patient will  beginning wearing the stockings first thing in the morning and removing them in the evening. The patient is instructed specifically not to sleep in the stockings.   In addition, behavioral modification will be initiated.  This will include frequent elevation, use of over the counter pain medications and exercise such as walking.  I have reviewed systemic causes for chronic edema such as liver, kidney and cardiac etiologies.  The patient denies problems with these organ systems.    Consideration for a lymph pump will also be made based upon the effectiveness of conservative therapy.  This would help to improve the edema control and prevent sequela such as ulcers and infections   Patient should undergo duplex ultrasound of the venous system to ensure that DVT or reflux is not present.  The patient will follow-up with me after the ultrasound.    2. Type 2 diabetes mellitus with vascular disease ([place]) Continue hypoglycemic medications as already ordered, these medications have been reviewed and there are no changes at this time.  Hgb A1C to be monitored as already arranged by primary service    Current Outpatient Medications on File Prior to Visit  Medication Sig Dispense Refill  . amLODipine (NORVASC) 5 MG tablet Take 5 mg by mouth daily.    Marland Kitchen azelastine (ASTELIN) 0.1 % nasal spray  Place 1 spray into both nostrils 2 (two) times daily.     . carbidopa-levodopa (SINEMET CR) 50-200 MG tablet Take 1 tablet by mouth at bedtime.     . carbidopa-levodopa (SINEMET IR) 25-250 MG tablet Take 1 tablet by mouth 4 (four) times daily.     . folic acid (FOLVITE) 833 MCG tablet Take 800 mcg by mouth daily.     . furosemide (LASIX) 40 MG tablet Take 40 mg by mouth daily.    Marland Kitchen glucagon 1 MG injection     . Insulin Infusion Pump Supplies (PARADIGM PUMP RESERVOIR 1.8ML) MISC 1 Device as directed.     . metoprolol succinate (TOPROL-XL) 50 MG 24 hr tablet Take 50 mg by mouth daily.     . Multiple Vitamin (MULTIVITAMIN WITH MINERALS) TABS tablet Take 1 tablet by mouth daily.    Marland Kitchen omeprazole (PRILOSEC) 40 MG capsule Take 40 mg by mouth daily.     . benzonatate (TESSALON) 200 MG capsule Take 200 mg by mouth 3 (three) times daily as needed for cough.  (Patient not taking: Reported on 06/18/2020)    . Cholecalciferol 25 MCG (1000 UT) tablet Take by mouth.    . pregabalin (LYRICA) 25 MG capsule Take 25 mg by mouth 2 (two) times daily. (Patient not taking: Reported on 06/18/2020)    . rivastigmine (EXELON) 1.5 MG capsule Take 1.5 mg by mouth 2 (two) times daily.  (Patient not taking: Reported on 06/18/2020)     No current facility-administered medications on file prior to visit.    There are no Patient Instructions on file for this visit. No follow-ups on file.   Kris Hartmann, NP

## 2020-06-24 ENCOUNTER — Ambulatory Visit (INDEPENDENT_AMBULATORY_CARE_PROVIDER_SITE_OTHER): Payer: Medicare Other | Admitting: Podiatry

## 2020-06-24 ENCOUNTER — Encounter: Payer: Medicare Other | Admitting: Physician Assistant

## 2020-06-24 ENCOUNTER — Other Ambulatory Visit: Payer: Self-pay

## 2020-06-24 ENCOUNTER — Encounter: Payer: Self-pay | Admitting: Podiatry

## 2020-06-24 DIAGNOSIS — M79674 Pain in right toe(s): Secondary | ICD-10-CM

## 2020-06-24 DIAGNOSIS — M2011 Hallux valgus (acquired), right foot: Secondary | ICD-10-CM

## 2020-06-24 DIAGNOSIS — B351 Tinea unguium: Secondary | ICD-10-CM

## 2020-06-24 DIAGNOSIS — M79675 Pain in left toe(s): Secondary | ICD-10-CM

## 2020-06-24 DIAGNOSIS — I890 Lymphedema, not elsewhere classified: Secondary | ICD-10-CM | POA: Diagnosis not present

## 2020-06-24 DIAGNOSIS — E1159 Type 2 diabetes mellitus with other circulatory complications: Secondary | ICD-10-CM

## 2020-06-24 NOTE — Progress Notes (Signed)
CYLAS, FALZONE ([id number]) Visit Report for 06/24/2020 Chief Complaint Document Details Patient Name: John Reilly, John Reilly. Date of Service: 06/24/2020 1:00 PM Medical Record Number: [id number] Patient Account Number: [id number] Date of Birth/Sex: [birth year] (65 y.o. M) Treating RN: Carlene Coria Primary Care Provider: Ramonita Lab Other Clinician: Referring Provider: Ramonita Lab Treating Provider/Extender: Skipper Cliche in Treatment: 1 Information Obtained from: Patient Chief Complaint Bilateral LE Lymphedema Electronic Signature(s) Signed: 06/24/2020 1:26:33 PM By: Worthy Keeler PA-C Entered By: Worthy Keeler on 06/24/2020 13:26:33 NIC, LAMPE ([id number]) -------------------------------------------------------------------------------- HPI Details Patient Name: John Reilly. Date of Service: 06/24/2020 1:00 PM Medical Record Number: [id number] Patient Account Number: [id number] Date of Birth/Sex: April 17, 1956 (65 y.o. M) Treating RN: Carlene Coria Primary Care Provider: Ramonita Lab Other Clinician: Referring Provider: Ramonita Lab Treating Provider/Extender: Skipper Cliche in Treatment: 1 History of Present Illness HPI Description: 06/17/2020 upon evaluation today patient appears to be doing somewhat poorly in regard to his legs with regard to swelling. He tells me that he does sleep in a chair but he does elevate his legs quite a bit using a pillow to get him up quite high. That is good news. With that being said he has not been wearing his compression stockings even on his left leg which is not currently weeping just due to the fact that to be honest he tells me that it was something that he did not really think much about and he does not know that he is ever been told to wear them every day. Nonetheless I do think this is something he should be wearing every day we discussed that today in general. Also think that he needs for now to have something else to help with compression of  the right leg. He will be seeing vascular tomorrow some not can actually wrap and will just use Tubigrip for the time being. That way this can be removed if need be. The patient does have a history of type 1 diabetes mellitus with a hemoglobin A1c of 6.6 on 03/21/2020. He also does tell me that he has Parkinson's disease which complicates things to some degree as well. Fortunately there is no signs of active infection at this time although he did recently complete Cipro today. There is no signs of obvious infection over the leg currently although there is some erythema I believe this may be more due to the swelling in the way the leg was wrapped with the Ace wrap. 06/24/2020 upon evaluation today patient still appears to have no obvious signs of open wounds at this point. He does have venous reflux study scheduled for July 04, 2020. With that being said he did have his toenails trimmed to try foot center. With that being said he has an appointment with Dr. Luana Shu tomorrow who was the original podiatrist who saw him and referred him to [place]. With all that being said I think that he is actually doing quite well I see nothing draining even after all this time and I feel like that keeping the Tubigrip in place until he sees vascular is probably the ideal way to go. Electronic Signature(s) Signed: 06/24/2020 1:43:36 PM By: Worthy Keeler PA-C Entered By: Worthy Keeler on 06/24/2020 13:43:36 John Reilly, John Reilly ([id number]) -------------------------------------------------------------------------------- Physical Exam Details Patient Name: John Reilly, John Reilly. Date of Service: 06/24/2020 1:00 PM Medical Record Number: [id number] Patient Account Number: [id number] Date of Birth/Sex: December 18, 1955 (65 y.o. M) Treating RN: Carlene Coria Primary Care Provider: Ramonita Lab Other  Clinician: Referring Provider: Ramonita Lab Treating Provider/Extender: Jeri Cos Weeks in Treatment: 1 Constitutional Well-nourished and  well-hydrated in no acute distress. Respiratory normal breathing without difficulty. Psychiatric this patient is able to make decisions and demonstrates good insight into disease process. Alert and Oriented x 3. pleasant and cooperative. Notes Patient's wound bed actually showed signs of good granulation epithelization. There does not appear to be any signs of active infection which is great news and overall very pleased with where things stand. No fevers, chills, nausea, vomiting, or diarrhea. Electronic Signature(s) Signed: 06/24/2020 1:43:50 PM By: Worthy Keeler PA-C Entered By: Worthy Keeler on 06/24/2020 13:43:50 John Reilly ([id number]) -------------------------------------------------------------------------------- Physician Orders Details Patient Name: John Reilly, John Reilly. Date of Service: 06/24/2020 1:00 PM Medical Record Number: [id number] Patient Account Number: [id number] Date of Birth/Sex: 1955-10-30 (65 y.o. M) Treating RN: Carlene Coria Primary Care Provider: Ramonita Lab Other Clinician: Referring Provider: Ramonita Lab Treating Provider/Extender: Skipper Cliche in Treatment: 1 Verbal / Phone Orders: No Diagnosis Coding ICD-10 Coding Code Description I89.0 Lymphedema, not elsewhere classified I87.2 Venous insufficiency (chronic) (peripheral) E10.621 Type 1 diabetes mellitus with foot ulcer G20 Parkinson's disease Discharge From [facility] Services o Discharge from [place] [facility] Complete Edema Control - Lymphedema / Segmental Compressive Device / Other o Tubigrip single layer applied. Non-Wound Condition o Additional non-wound orders/instructions: - apply lotion daily Electronic Signature(s) Signed: 06/24/2020 4:08:54 PM By: Carlene Coria RN Signed: 06/25/2020 9:55:27 AM By: Worthy Keeler PA-C Entered By: Carlene Coria on 06/24/2020 13:41:54 John Reilly  ([id number]) -------------------------------------------------------------------------------- Problem List Details Patient Name: John Reilly, John Reilly. Date of Service: 06/24/2020 1:00 PM Medical Record Number: [id number] Patient Account Number: [id number] Date of Birth/Sex: 11-14-1955 (65 y.o. M) Treating RN: Carlene Coria Primary Care Provider: Ramonita Lab Other Clinician: Referring Provider: Ramonita Lab Treating Provider/Extender: Jeri Cos Weeks in Treatment: 1 Active Problems ICD-10 Encounter Code Description Active Date MDM Diagnosis I89.0 Lymphedema, not elsewhere classified 06/17/2020 No Yes I87.2 Venous insufficiency (chronic) (peripheral) 06/17/2020 No Yes E10.621 Type 1 diabetes mellitus with foot ulcer 06/17/2020 No Yes G20 Parkinson's disease 06/17/2020 No Yes Inactive Problems Resolved Problems Electronic Signature(s) Signed: 06/24/2020 1:26:27 PM By: Worthy Keeler PA-C Entered By: Worthy Keeler on 06/24/2020 13:26:27 John Reilly ([id number]) -------------------------------------------------------------------------------- Progress Note Details Patient Name: John Reilly. Date of Service: 06/24/2020 1:00 PM Medical Record Number: [id number] Patient Account Number: [id number] Date of Birth/Sex: [birth year] (65 y.o. M) Treating RN: Carlene Coria Primary Care Provider: Ramonita Lab Other Clinician: Referring Provider: Ramonita Lab Treating Provider/Extender: Skipper Cliche in Treatment: 1 Subjective Chief Complaint Information obtained from Patient Bilateral LE Lymphedema History of Present Illness (HPI) 06/17/2020 upon evaluation today patient appears to be doing somewhat poorly in regard to his legs with regard to swelling. He tells me that he does sleep in a chair but he does elevate his legs quite a bit using a pillow to get him up quite high. That is good news. With that being said he has not been wearing his compression stockings even on his left leg which is not  currently weeping just due to the fact that to be honest he tells me that it was something that he did not really think much about and he does not know that he is ever been told to wear them every day. Nonetheless I do think this is something he should be wearing every day we discussed that today in general. Also think that he needs for now to  have something else to help with compression of the right leg. He will be seeing vascular tomorrow some not can actually wrap and will just use Tubigrip for the time being. That way this can be removed if need be. The patient does have a history of type 1 diabetes mellitus with a hemoglobin A1c of 6.6 on 03/21/2020. He also does tell me that he has Parkinson's disease which complicates things to some degree as well. Fortunately there is no signs of active infection at this time although he did recently complete Cipro today. There is no signs of obvious infection over the leg currently although there is some erythema I believe this may be more due to the swelling in the way the leg was wrapped with the Ace wrap. 06/24/2020 upon evaluation today patient still appears to have no obvious signs of open wounds at this point. He does have venous reflux study scheduled for July 04, 2020. With that being said he did have his toenails trimmed to try foot center. With that being said he has an appointment with Dr. Luana Shu tomorrow who was the original podiatrist who saw him and referred him to [place]. With all that being said I think that he is actually doing quite well I see nothing draining even after all this time and I feel like that keeping the Tubigrip in place until he sees vascular is probably the ideal way to go. Objective Constitutional Well-nourished and well-hydrated in no acute distress. Vitals Time Taken: 1:17 PM, Height: 70 in, Weight: 163 lbs, BMI: 23.4, Temperature: 97.7 F, Pulse: 90 bpm, Respiratory Rate: 18 breaths/min, Blood Pressure: 92/61  mmHg. Respiratory normal breathing without difficulty. Psychiatric this patient is able to make decisions and demonstrates good insight into disease process. Alert and Oriented x 3. pleasant and cooperative. General Notes: Patient's wound bed actually showed signs of good granulation epithelization. There does not appear to be any signs of active infection which is great news and overall very pleased with where things stand. No fevers, chills, nausea, vomiting, or diarrhea. Assessment Active Problems ICD-10 Lymphedema, not elsewhere classified Venous insufficiency (chronic) (peripheral) Type 1 diabetes mellitus with foot ulcer Parkinson's disease John Reilly, John Reilly ([id number]) Plan Discharge From [facility] Services: Discharge from [place] [facility] Complete Edema Control - Lymphedema / Segmental Compressive Device / Other: Tubigrip single layer applied. Non-Wound Condition: Additional non-wound orders/instructions: - apply lotion daily 1. Would recommend that we discharge the patient as of today we will continue with the Tubigrip to help with some of the edema control along with a moisturizer cream such as Eucerin which I think is the best way to go. 2. I am also can recommend that we have the patient continue to elevate his legs much as possible. 3. He does have his venous reflux study and follow-up with the nurse practitioner at vascular on again February 24. We will see the patient back for follow-up visit as needed if anything changes or worsens. Electronic Signature(s) Signed: 06/24/2020 1:44:28 PM By: Worthy Keeler PA-C Entered By: Worthy Keeler on 06/24/2020 13:44:27 John Reilly ([id number]) -------------------------------------------------------------------------------- SuperBill Details Patient Name: John Reilly. Date of Service: 06/24/2020 Medical Record Number: [id number] Patient Account Number: [id number] Date of Birth/Sex: [birth year] (65 y.o. M) Treating RN:  Carlene Coria Primary Care Provider: Ramonita Lab Other Clinician: Referring Provider: Ramonita Lab Treating Provider/Extender: Jeri Cos Weeks in Treatment: 1 Diagnosis Coding ICD-10 Codes Code Description I89.0 Lymphedema, not elsewhere classified I87.2 Venous insufficiency (chronic) (peripheral) E10.621 Type  1 diabetes mellitus with foot ulcer G20 Parkinson's disease Facility Procedures CPT4 Code: [id number] Description: [phone] - WOUND CARE VISIT-LEV 2 EST PT Modifier: Quantity: 1 Physician Procedures CPT4 Code: [id number] Description: [id number] - WC PHYS LEVEL 3 - EST PT Modifier: Quantity: 1 CPT4 Code: Description: ICD-10 Diagnosis Description I89.0 Lymphedema, not elsewhere classified I87.2 Venous insufficiency (chronic) (peripheral) E10.621 Type 1 diabetes mellitus with foot ulcer G20 Parkinson's disease Modifier: Quantity: Electronic Signature(s) Signed: 06/24/2020 1:44:39 PM By: Worthy Keeler PA-C Entered By: Worthy Keeler on 06/24/2020 13:44:39

## 2020-06-24 NOTE — Progress Notes (Signed)
This patient returns to my office for at risk foot care.  This patient requires this care by a professional since this patient will be at risk due to having diabetes and PVD.   Marland Kitchen  This patient is unable to cut nails himself since the patient cannot reach his nails.These nails are painful walking and wearing shoes. Patient is wearing an unna boot right foot/leg.  Patient is wearing a compression sock left foot. This patient presents for at risk foot care today.  General Appearance  Alert, conversant and in no acute stress.  Vascular  Dorsalis pedis  are palpable  bilaterally. Posterior tibial pulses are absent  B/L. Capillary return is within normal limits  bilaterally. Temperature is within normal limits  Bilaterally. Venous stasis legs  B/L.( recorded from previous visit.  Neurologic  Senn-Weinstein monofilament wire test within normal limits  bilaterally. Muscle power within normal limits bilaterally.  Nails Thick disfigured discolored nails with subungual debris  from hallux to fifth toes bilaterally. No evidence of bacterial infection or drainage bilaterally.  Orthopedic  No limitations of motion  feet .  No crepitus or effusions noted. 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  Hammer toes 2,3 right foot.  Skin  normotropic skin with no porokeratosis noted bilaterally.  No signs of infections or ulcers noted.     Onychomycosis  Pain in right toes  Pain in left toes  Consent was obtained for treatment procedures.   Mechanical debridement of nails 1-5  bilaterally performed with a nail nipper.  Filed with dremel without incident.     Return office visit     3 months                 Told patient to return for periodic foot care and evaluation due to potential at risk complications.   Gardiner Barefoot DPM

## 2020-06-24 NOTE — Progress Notes (Addendum)
KIPP, SHANK ([id number]) Visit Report for 06/24/2020 Arrival Information Details Patient Name: John Reilly, John Reilly. Date of Service: 06/24/2020 1:00 PM Medical Record Number: [id number] Patient Account Number: [id number] Date of Birth/Sex: [birth year] (65 y.o. M) Treating RN: Dolan Amen Primary Care Leobardo Granlund: Ramonita Lab Other Clinician: Referring Amadeo Coke: Ramonita Lab Treating Anjana Cheek/Extender: Skipper Cliche in Treatment: 1 Visit Information History Since Last Visit Pain Present Now: No Patient Arrived: Ambulatory Arrival Time: 13:17 Accompanied By: self Transfer Assistance: None Patient Identification Verified: Yes Secondary Verification Process Completed: Yes Electronic Signature(s) Signed: 06/24/2020 4:00:09 PM By: Georges Mouse, Minus Breeding RN Entered By: Georges Mouse, Minus Breeding on 06/24/2020 13:17:39 John Reilly ([id number]) -------------------------------------------------------------------------------- Clinic Level of Care Assessment Details Patient Name: John Reilly. Date of Service: 06/24/2020 1:00 PM Medical Record Number: [id number] Patient Account Number: [id number] Date of Birth/Sex: [birth year] (65 y.o. M) Treating RN: Carlene Coria Primary Care Ketra Duchesne: Ramonita Lab Other Clinician: Referring Raygen Linquist: Ramonita Lab Treating Jancie Kercher/Extender: Skipper Cliche in Treatment: 1 Clinic Level of Care Assessment Items TOOL 4 Quantity Score X - Use when only an EandM is performed on FOLLOW-UP visit 1 0 ASSESSMENTS - Nursing Assessment / Reassessment X - Reassessment of Co-morbidities (includes updates in patient status) 1 10 X- 1 5 Reassessment of Adherence to Treatment Plan ASSESSMENTS - Wound and Skin Assessment / Reassessment []  - Simple Wound Assessment / Reassessment - one wound 0 []  - 0 Complex Wound Assessment / Reassessment - multiple wounds []  - 0 Dermatologic / Skin Assessment (not related to wound area) ASSESSMENTS - Focused Assessment []  -  Circumferential Edema Measurements - multi extremities 0 []  - 0 Nutritional Assessment / Counseling / Intervention []  - 0 Lower Extremity Assessment (monofilament, tuning fork, pulses) []  - 0 Peripheral Arterial Disease Assessment (using hand held doppler) ASSESSMENTS - Ostomy and/or Continence Assessment and Care []  - Incontinence Assessment and Management 0 []  - 0 Ostomy Care Assessment and Management (repouching, etc.) PROCESS - Coordination of Care []  - Simple Patient / Family Education for ongoing care 0 []  - 0 Complex (extensive) Patient / Family Education for ongoing care X- 1 10 Staff obtains Consents, Records, Test Results / Process Orders []  - 0 Staff telephones HHA, Nursing Homes / Clarify orders / etc []  - 0 Routine Transfer to another Facility (non-emergent condition) []  - 0 Routine Hospital Admission (non-emergent condition) []  - 0 New Admissions / Biomedical engineer / Ordering NPWT, Apligraf, etc. []  - 0 Emergency Hospital Admission (emergent condition) X- 1 10 Simple Discharge Coordination []  - 0 Complex (extensive) Discharge Coordination PROCESS - Special Needs []  - Pediatric / Minor Patient Management 0 []  - 0 Isolation Patient Management []  - 0 Hearing / Language / Visual special needs []  - 0 Assessment of Community assistance (transportation, D/C planning, etc.) []  - 0 Additional assistance / Altered mentation []  - 0 Support Surface(s) Assessment (bed, cushion, seat, etc.) INTERVENTIONS - Wound Cleansing / Measurement John Reilly, John Reilly. ([id number]) []  - 0 Simple Wound Cleansing - one wound []  - 0 Complex Wound Cleansing - multiple wounds []  - 0 Wound Imaging (photographs - any number of wounds) []  - 0 Wound Tracing (instead of photographs) []  - 0 Simple Wound Measurement - one wound []  - 0 Complex Wound Measurement - multiple wounds INTERVENTIONS - Wound Dressings []  - Small Wound Dressing one or multiple wounds 0 []  - 0 Medium Wound  Dressing one or multiple wounds []  - 0 Large Wound Dressing one or multiple wounds []  - 0 Application of Medications -  topical []  - 0 Application of Medications - injection INTERVENTIONS - Miscellaneous []  - External ear exam 0 []  - 0 Specimen Collection (cultures, biopsies, blood, body fluids, etc.) []  - 0 Specimen(s) / Culture(s) sent or taken to Lab for analysis []  - 0 Patient Transfer (multiple staff / Harrel Lemon Lift / Similar devices) []  - 0 Simple Staple / Suture removal (25 or less) []  - 0 Complex Staple / Suture removal (26 or more) []  - 0 Hypo / Hyperglycemic Management (close monitor of Blood Glucose) []  - 0 Ankle / Brachial Index (ABI) - do not check if billed separately X- 1 5 Vital Signs Has the patient been seen at the hospital within the last three years: Yes Total Score: 40 Level Of Care: New/Established - Level 2 Electronic Signature(s) Signed: 06/24/2020 4:08:54 PM By: Carlene Coria RN Entered By: Carlene Coria on 06/24/2020 13:42:16 John Reilly ([id number]) -------------------------------------------------------------------------------- Encounter Discharge Information Details Patient Name: John Reilly. Date of Service: 06/24/2020 1:00 PM Medical Record Number: [id number] Patient Account Number: [id number] Date of Birth/Sex: [birth year] (65 y.o. M) Treating RN: Carlene Coria Primary Care Nichalos Brenton: Ramonita Lab Other Clinician: Referring Amry Cathy: Ramonita Lab Treating Md Smola/Extender: Skipper Cliche in Treatment: 1 Encounter Discharge Information Items Discharge Condition: Stable Ambulatory Status: Ambulatory Discharge Destination: Home Transportation: Private Auto Accompanied By: self Schedule Follow-up Appointment: Yes Clinical Summary of Care: Patient Declined Electronic Signature(s) Signed: 06/24/2020 4:08:54 PM By: Carlene Coria RN Entered By: Carlene Coria on 06/24/2020 13:56:17 John Reilly  ([id number]) -------------------------------------------------------------------------------- Lower Extremity Assessment Details Patient Name: John Reilly, John Reilly. Date of Service: 06/24/2020 1:00 PM Medical Record Number: [id number] Patient Account Number: [id number] Date of Birth/Sex: 02/28/1956 (65 y.o. M) Treating RN: Dolan Amen Primary Care Saina Waage: Ramonita Lab Other Clinician: Referring Kesi Perrow: Ramonita Lab Treating Cella Cappello/Extender: Jeri Cos Weeks in Treatment: 1 Edema Assessment Assessed: [Left: No] [Right: Yes] Edema: [Left: Ye] [Right: s] Calf Left: Right: Point of Measurement: 27 cm From Medial Instep 31 cm Ankle Left: Right: Point of Measurement: 8 cm From Medial Instep 27.5 cm Vascular Assessment Pulses: Dorsalis Pedis Palpable: [Right:Yes] Electronic Signature(s) Signed: 06/24/2020 4:00:09 PM By: Georges Mouse, Minus Breeding RN Entered By: Georges Mouse, [place] on 06/24/2020 13:23:52 John Reilly ([id number]) -------------------------------------------------------------------------------- Multi Wound Chart Details Patient Name: John Reilly. Date of Service: 06/24/2020 1:00 PM Medical Record Number: [id number] Patient Account Number: [id number] Date of Birth/Sex: 1956/01/04 (65 y.o. M) Treating RN: Carlene Coria Primary Care Nuriya Stuck: Ramonita Lab Other Clinician: Referring Debroh Sieloff: Ramonita Lab Treating Onyx Edgley/Extender: Jeri Cos Weeks in Treatment: 1 Vital Signs Height(in): 70 Pulse(bpm): 90 Weight(lbs): 163 Blood Pressure(mmHg): 92/61 Body Mass Index(BMI): 23 Temperature(F): 97.7 Respiratory Rate(breaths/min): 18 Wound Assessments Treatment Notes Electronic Signature(s) Signed: 06/24/2020 4:08:54 PM By: Carlene Coria RN Entered By: Carlene Coria on 06/24/2020 13:38:33 John Reilly ([id number]) -------------------------------------------------------------------------------- [place] Details Patient Name: John Reilly, John Reilly. Date  of Service: 06/24/2020 1:00 PM Medical Record Number: [id number] Patient Account Number: [id number] Date of Birth/Sex: 1956-01-03 (65 y.o. M) Treating RN: Carlene Coria Primary Care Eilleen Davoli: Ramonita Lab Other Clinician: Referring Alexandra Lipps: Ramonita Lab Treating Anthone Prieur/Extender: Skipper Cliche in Treatment: 1 Active Inactive Electronic Signature(s) Signed: 07/12/2020 4:15:22 PM By: Gretta Cool BSN, RN, CWS, Kim RN, BSN Signed: 07/17/2020 12:10:43 PM By: Carlene Coria RN Previous Signature: 06/24/2020 4:08:54 PM Version By: Carlene Coria RN Entered By: Gretta Cool BSN, RN, CWS, Kim on 07/12/2020 16:15:22 John Reilly, John Reilly ([id number]) -------------------------------------------------------------------------------- Pain Assessment Details Patient Name: John Reilly, John Reilly. Date of Service: 06/24/2020 1:00 PM Medical Record  Number: [id number] Patient Account Number: [id number] Date of Birth/Sex: 1955-08-09 (65 y.o. M) Treating RN: Dolan Amen Primary Care Dontell Mian: Ramonita Lab Other Clinician: Referring Devonta Blanford: Ramonita Lab Treating Fannie Gathright/Extender: Skipper Cliche in Treatment: 1 Active Problems Location of Pain Severity and Description of Pain Patient Has Paino No Site Locations Rate the pain. Current Pain Level: 0 Pain Management and Medication Current Pain Management: Electronic Signature(s) Signed: 06/24/2020 4:00:09 PM By: Georges Mouse, Minus Breeding RN Entered By: Georges Mouse, Kenia on 06/24/2020 13:19:46 John Reilly ([id number]) -------------------------------------------------------------------------------- Patient/Caregiver Education Details Patient Name: John Reilly, John Reilly. Date of Service: 06/24/2020 1:00 PM Medical Record Number: [id number] Patient Account Number: [id number] Date of Birth/Gender: 06-Nov-1955 (65 y.o. M) Treating RN: Carlene Coria Primary Care Physician: Ramonita Lab Other Clinician: Referring Physician: Ramonita Lab Treating Physician/Extender: Skipper Cliche in  Treatment: 1 Education Assessment Education Provided To: Patient Education Topics Provided Wound/Skin Impairment: Methods: Explain/Verbal Responses: State content correctly Electronic Signature(s) Signed: 06/24/2020 4:08:54 PM By: Carlene Coria RN Entered By: Carlene Coria on 06/24/2020 13:42:32 John Reilly ([id number]) -------------------------------------------------------------------------------- Vitals Details Patient Name: John Reilly. Date of Service: 06/24/2020 1:00 PM Medical Record Number: [id number] Patient Account Number: [id number] Date of Birth/Sex: [birth year] (65 y.o. M) Treating RN: Dolan Amen Primary Care Ilyas Lipsitz: Ramonita Lab Other Clinician: Referring Jayse Hodkinson: Ramonita Lab Treating Pretty Weltman/Extender: Skipper Cliche in Treatment: 1 Vital Signs Time Taken: 13:17 Temperature (F): 97.7 Height (in): 70 Pulse (bpm): 90 Weight (lbs): 163 Respiratory Rate (breaths/min): 18 Body Mass Index (BMI): 23.4 Blood Pressure (mmHg): 92/61 Reference Range: 80 - 120 mg / dl Electronic Signature(s) Signed: 06/24/2020 4:00:09 PM By: Georges Mouse, Minus Breeding RN Entered By: Georges Mouse, Minus Breeding on 06/24/2020 13:19:37

## 2020-07-01 ENCOUNTER — Ambulatory Visit: Payer: Medicare Other | Admitting: Physician Assistant

## 2020-07-03 ENCOUNTER — Other Ambulatory Visit (INDEPENDENT_AMBULATORY_CARE_PROVIDER_SITE_OTHER): Payer: Self-pay | Admitting: Nurse Practitioner

## 2020-07-03 DIAGNOSIS — M7989 Other specified soft tissue disorders: Secondary | ICD-10-CM

## 2020-07-03 DIAGNOSIS — M79661 Pain in right lower leg: Secondary | ICD-10-CM

## 2020-07-04 ENCOUNTER — Ambulatory Visit (INDEPENDENT_AMBULATORY_CARE_PROVIDER_SITE_OTHER): Payer: Medicare Other

## 2020-07-04 ENCOUNTER — Other Ambulatory Visit: Payer: Self-pay

## 2020-07-04 ENCOUNTER — Encounter (INDEPENDENT_AMBULATORY_CARE_PROVIDER_SITE_OTHER): Payer: Self-pay | Admitting: Nurse Practitioner

## 2020-07-04 ENCOUNTER — Ambulatory Visit (INDEPENDENT_AMBULATORY_CARE_PROVIDER_SITE_OTHER): Payer: Medicare Other | Admitting: Nurse Practitioner

## 2020-07-04 VITALS — BP 105/69 | HR 91 | Ht 70.0 in | Wt 153.0 lb

## 2020-07-04 DIAGNOSIS — R600 Localized edema: Secondary | ICD-10-CM

## 2020-07-04 DIAGNOSIS — E1159 Type 2 diabetes mellitus with other circulatory complications: Secondary | ICD-10-CM | POA: Diagnosis not present

## 2020-07-04 DIAGNOSIS — I890 Lymphedema, not elsewhere classified: Secondary | ICD-10-CM | POA: Diagnosis not present

## 2020-07-04 DIAGNOSIS — M79661 Pain in right lower leg: Secondary | ICD-10-CM

## 2020-07-07 ENCOUNTER — Encounter (INDEPENDENT_AMBULATORY_CARE_PROVIDER_SITE_OTHER): Payer: Self-pay | Admitting: Nurse Practitioner

## 2020-07-07 NOTE — Progress Notes (Signed)
Subjective:    Patient ID: John Reilly, male    DOB: [birth year], 65 y.o.   MRN: [id number] Chief Complaint  Patient presents with  . Follow-up    U/S    The patient returns today for evaluation of pain and swelling of the right ankle area.  Patient has a previous history of multiple ankle surgeries on the right lower extremity.  Since the patient's last office visit he has been diligent with utilizing medical grade 1 compression stockings and elevation.  The patient's swelling is much better controlled per the patient and his wife.  The patient's wound is also greatly improved.  He denies any recurrent episodes of cellulitis.  He denies any fever or chills.  Today noninvasive studies show no evidence of DVT or superficial vein thrombosis.  There is no evidence of deep venous insufficiency seen.  There is evidence of superficial reflux in the great saphenous vein at the saphenofemoral junction as well as in the small saphenous vein.    Review of Systems  Cardiovascular: Positive for leg swelling.  All other systems reviewed and are negative.      Objective:   Physical Exam Vitals reviewed.  HENT:     Head: Normocephalic.  Cardiovascular:     Rate and Rhythm: Normal rate.     Pulses: Normal pulses.  Pulmonary:     Effort: Pulmonary effort is normal.  Musculoskeletal:     Right lower leg: 1+ Edema present.  Neurological:     Mental Status: He is alert and oriented to person, place, and time.  Psychiatric:        Mood and Affect: Mood normal.        Behavior: Behavior normal.        Thought Content: Thought content normal.        Judgment: Judgment normal.     BP 105/69   Pulse 91   Ht 5\' 10"  (1.778 m)   Wt 153 lb (69.4 kg)   BMI 21.95 kg/m   Past Medical History:  Diagnosis Date  . Cancer ([place])    Pt reports on 10/02/17 that he has never been dx with cancer  . COPD (chronic obstructive pulmonary disease) ([place])   . Diabetes mellitus without complication ([place])    . GERD (gastroesophageal reflux disease)   . Hypertension   . Multiple duodenal ulcers   . Peripheral vascular disease ([facility])    Peripheral Neuropathy  . Retinopathy     Social History   Socioeconomic History  . Marital status: Married    Spouse name: Not on file  . Number of children: Not on file  . Years of education: Not on file  . Highest education level: Not on file  Occupational History  . Not on file  Tobacco Use  . Smoking status: Former Smoker    Quit date: 10/21/2005    Years since quitting: 14.7  . Smokeless tobacco: Never Used  Vaping Use  . Vaping Use: Never used  Substance and Sexual Activity  . Alcohol use: Yes    Alcohol/week: 42.0 standard drinks    Types: 42 Cans of beer per week    Comment: patient states he drinks 1 quart of beer per day  . Drug use: No  . Sexual activity: Not on file  Other Topics Concern  . Not on file  Social History Narrative  . Not on file   Social Determinants of Health   Financial Resource Strain: Not on file  Food Insecurity: Not on file  Transportation Needs: Not on file  Physical Activity: Not on file  Stress: Not on file  Social Connections: Not on file  Intimate Partner Violence: Not on file    Past Surgical History:  Procedure Laterality Date  . ANKLE ARTHROPLASTY     2013  . COLONOSCOPY WITH PROPOFOL N/A 10/12/2016   Procedure: COLONOSCOPY WITH PROPOFOL;  Surgeon: Manya Silvas, MD;  Location: [facility];  Service: Endoscopy;  Laterality: N/A;  . INTRAMEDULLARY (IM) NAIL INTERTROCHANTERIC Left 08/08/2018   Procedure: INTRAMEDULLARY (IM) NAIL INTERTROCHANTRIC;  Surgeon: Corky Mull, MD;  Location: [facility] ORS;  Service: Orthopedics;  Laterality: Left;    Family History  Problem Relation Age of Onset  . Stroke Mother   . Alzheimer's disease Father   . Diabetes Brother     Allergies  Allergen Reactions  . Penicillins Rash and Other (See Comments)    Has patient had a PCN reaction causing immediate  rash, facial/tongue/throat swelling, SOB or lightheadedness with hypotension: Yes Has patient had a PCN reaction causing severe rash involving mucus membranes or skin necrosis: No Has patient had a PCN reaction that required hospitalization: No Has patient had a PCN reaction occurring within the last 10 years: No If all of the above answers are "NO", then may proceed with Cephalosporin use. Other reaction(s): RASH Has patient had a PCN reaction causing immediate rash, facial/tongue/throat swelling, SOB or lightheadedness with hypotension: Yes Has patient had a PCN reaction causing severe rash involving mucus membranes or skin necrosis: No Has patient had a PCN reaction that required hospitalization: No Has patient had a PCN reaction occurring within the last 10 years: No If all of the above answers are "NO", then may proceed with Cephalosporin use. Reaction: Unknown; childhood reaction  Has patient had a PCN reaction causing immediate rash, facial/tongue/throat swelling, SOB or lightheadedness with hypotension: Yes Has patient had a PCN reaction causing severe rash involving mucus membranes or skin necrosis: No Has patient had a PCN reaction that required hospitalization: No Has patient had a PCN reaction occurring within the last 10 years: No If all of the above answers are "NO", then may proceed with Cephalosporin use.  . Sulfa Antibiotics Rash    Katherina Right Syndrome (SJS)     CBC Latest Ref Rng & Units 03/24/2020 03/23/2020 03/22/2020  WBC 4.0 - 10.5 K/uL 7.7 13.7(H) 9.4  Hemoglobin 13.0 - 17.0 g/dL 9.7(L) 9.5(L) 10.7(L)  Hematocrit 39.0 - 52.0 % 28.8(L) 28.1(L) 31.6(L)  Platelets 150 - 400 K/uL 282 282 320      CMP     Component Value Date/Time   NA 128 (L) 03/24/2020 0513   K 3.8 03/24/2020 0513   CL 99 03/24/2020 0513   CO2 21 (L) 03/24/2020 0513   GLUCOSE 251 (H) 03/24/2020 0513   BUN 11 03/24/2020 0513   CREATININE 0.97 03/24/2020 0513   CALCIUM 7.9 (L)  03/24/2020 0513   PROT 5.3 (L) 03/23/2020 0504   ALBUMIN 2.3 (L) 03/23/2020 0504   AST 12 (L) 03/23/2020 0504   ALT 5 03/23/2020 0504   ALKPHOS 60 03/23/2020 0504   BILITOT 0.8 03/23/2020 0504   GFRNONAA >60 03/24/2020 0513   GFRAA >60 08/15/2018 0407     No results found.     Assessment & Plan:   1. Lymphedema No surgery or intervention at this point in time.    I have reviewed my discussion with the patient regarding venous insufficiency and secondary lymph  edema and why it  causes symptoms. I have discussed with the patient the chronic skin changes that accompany these problems and the long term sequela such as ulceration and infection.  Patient will continue wearing graduated compression stockings class 1 (20-30 mmHg) on a daily basis a prescription was given to the patient to keep this updated. The patient will  put the stockings on first thing in the morning and removing them in the evening. The patient is instructed specifically not to sleep in the stockings.  In addition, behavioral modification including elevation during the day will be continued.  Diet and salt restriction was also discussed.  The patient has been more diligent with conservative therapy recently.  He notes that the swelling is greatly improved.  He would like to proceed with conservative therapy for now.  We will have the patient return in 6 months to reevaluate edema.  We will also evaluate if a lymphedema pump will be useful at that time as well.   2. Type 2 diabetes mellitus with vascular disease ([place]) Continue hypoglycemic medications as already ordered, these medications have been reviewed and there are no changes at this time.  Hgb A1C to be monitored as already arranged by primary service    Current Outpatient Medications on File Prior to Visit  Medication Sig Dispense Refill  . amLODipine (NORVASC) 5 MG tablet Take 5 mg by mouth daily.    Marland Kitchen azelastine (ASTELIN) 0.1 % nasal spray Place 1 spray into  both nostrils 2 (two) times daily.     . benzonatate (TESSALON) 200 MG capsule Take 200 mg by mouth 3 (three) times daily as needed for cough.    . carbidopa-levodopa (SINEMET CR) 50-200 MG tablet Take 1 tablet by mouth at bedtime.     . carbidopa-levodopa (SINEMET IR) 25-250 MG tablet Take 1 tablet by mouth 4 (four) times daily.     . Cholecalciferol 25 MCG (1000 UT) tablet Take by mouth.    . folic acid (FOLVITE) 676 MCG tablet Take 800 mcg by mouth daily.     . furosemide (LASIX) 40 MG tablet Take 40 mg by mouth daily.    Marland Kitchen glucagon 1 MG injection     . Insulin Infusion Pump Supplies (PARADIGM PUMP RESERVOIR 1.8ML) MISC 1 Device as directed.     . metoprolol succinate (TOPROL-XL) 50 MG 24 hr tablet Take 50 mg by mouth daily.     . Multiple Vitamin (MULTIVITAMIN WITH MINERALS) TABS tablet Take 1 tablet by mouth daily.    Marland Kitchen omeprazole (PRILOSEC) 40 MG capsule Take 40 mg by mouth daily.     . pregabalin (LYRICA) 25 MG capsule Take 25 mg by mouth 2 (two) times daily.    . rivastigmine (EXELON) 1.5 MG capsule Take 1.5 mg by mouth 2 (two) times daily.     No current facility-administered medications on file prior to visit.    There are no Patient Instructions on file for this visit. No follow-ups on file.   Kris Hartmann, NP

## 2020-07-19 ENCOUNTER — Ambulatory Visit
Admission: RE | Admit: 2020-07-19 | Discharge: 2020-07-19 | Disposition: A | Discharge status: Home / Self Care | Payer: Medicare Other | Source: Ambulatory Visit | Attending: Physician Assistant | Admitting: Physician Assistant

## 2020-07-19 ENCOUNTER — Other Ambulatory Visit: Payer: Self-pay | Admitting: Physician Assistant

## 2020-07-19 ENCOUNTER — Other Ambulatory Visit (HOSPITAL_COMMUNITY): Payer: Self-pay | Admitting: Physician Assistant

## 2020-07-19 ENCOUNTER — Other Ambulatory Visit: Payer: Self-pay

## 2020-07-19 DIAGNOSIS — Y92009 Unspecified place in unspecified non-institutional (private) residence as the place of occurrence of the external cause: Secondary | ICD-10-CM

## 2020-07-19 DIAGNOSIS — S1980XA Other specified injuries of unspecified part of neck, initial encounter: Secondary | ICD-10-CM | POA: Diagnosis not present

## 2020-07-19 DIAGNOSIS — M542 Cervicalgia: Secondary | ICD-10-CM | POA: Insufficient documentation

## 2020-07-19 DIAGNOSIS — S0990XA Unspecified injury of head, initial encounter: Secondary | ICD-10-CM | POA: Diagnosis not present

## 2020-07-19 DIAGNOSIS — W19XXXA Unspecified fall, initial encounter: Secondary | ICD-10-CM | POA: Diagnosis not present

## 2020-09-23 ENCOUNTER — Ambulatory Visit: Payer: Medicare Other | Admitting: Podiatry

## 2020-09-26 ENCOUNTER — Encounter: Payer: Self-pay | Admitting: Podiatry

## 2020-09-26 ENCOUNTER — Ambulatory Visit (INDEPENDENT_AMBULATORY_CARE_PROVIDER_SITE_OTHER): Payer: Medicare Other | Admitting: Podiatry

## 2020-09-26 ENCOUNTER — Other Ambulatory Visit: Payer: Self-pay

## 2020-09-26 DIAGNOSIS — M2041 Other hammer toe(s) (acquired), right foot: Secondary | ICD-10-CM | POA: Diagnosis not present

## 2020-09-26 DIAGNOSIS — M2011 Hallux valgus (acquired), right foot: Secondary | ICD-10-CM

## 2020-09-26 DIAGNOSIS — M79675 Pain in left toe(s): Secondary | ICD-10-CM

## 2020-09-26 DIAGNOSIS — E1159 Type 2 diabetes mellitus with other circulatory complications: Secondary | ICD-10-CM | POA: Diagnosis not present

## 2020-09-26 DIAGNOSIS — M79674 Pain in right toe(s): Secondary | ICD-10-CM

## 2020-09-26 DIAGNOSIS — M2042 Other hammer toe(s) (acquired), left foot: Secondary | ICD-10-CM

## 2020-09-26 DIAGNOSIS — B351 Tinea unguium: Secondary | ICD-10-CM

## 2020-09-26 NOTE — Progress Notes (Signed)
This patient returns to my office for at risk foot care.  This patient requires this care by a professional since this patient will be at risk due to having diabetes and PVD.   Marland Kitchen  This patient is unable to cut nails himself since the patient cannot reach his nails.These nails are painful walking and wearing shoes.    This patient presents for at risk foot care today.  General Appearance  Alert, conversant and in no acute stress.  Vascular  Dorsalis pedis  are palpable  bilaterally. Posterior tibial pulses are absent  B/L. Capillary return is within normal limits  bilaterally. Temperature is within normal limits  Bilaterally. Venous stasis legs  B/L.  Neurologic  Senn-Weinstein monofilament wire test within normal limits  bilaterally. Muscle power within normal limits bilaterally.  Nails Thick disfigured discolored nails with subungual debris  from hallux to fifth toes bilaterally. No evidence of bacterial infection or drainage bilaterally.  Orthopedic  No limitations of motion  feet .  No crepitus or effusions noted. 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  Hammer toes 2,3 right foot.  Skin  normotropic skin with no porokeratosis noted bilaterally.  No signs of infections or ulcers noted.     Onychomycosis  Pain in right toes  Pain in left toes  Consent was obtained for treatment procedures.   Mechanical debridement of nails 1-5  bilaterally performed with a nail nipper.  Filed with dremel without incident.     Return office visit     3 months                 Told patient to return for periodic foot care and evaluation due to potential at risk complications.   Gardiner Barefoot DPM

## 2020-11-08 DIAGNOSIS — S22080A Wedge compression fracture of T11-T12 vertebra, initial encounter for closed fracture: Secondary | ICD-10-CM

## 2020-11-08 HISTORY — DX: Wedge compression fracture of T11-T12 vertebra, initial encounter for closed fracture: S22.080A

## 2020-11-21 ENCOUNTER — Other Ambulatory Visit: Payer: Self-pay | Admitting: Internal Medicine

## 2020-11-21 ENCOUNTER — Other Ambulatory Visit (HOSPITAL_COMMUNITY): Payer: Self-pay | Admitting: Internal Medicine

## 2020-11-21 DIAGNOSIS — S22080A Wedge compression fracture of T11-T12 vertebra, initial encounter for closed fracture: Secondary | ICD-10-CM

## 2020-11-25 ENCOUNTER — Other Ambulatory Visit: Payer: Self-pay

## 2020-11-25 ENCOUNTER — Ambulatory Visit
Admission: RE | Admit: 2020-11-25 | Discharge: 2020-11-25 | Disposition: A | Discharge status: Home / Self Care | Payer: Medicare Other | Source: Ambulatory Visit | Attending: Internal Medicine | Admitting: Internal Medicine

## 2020-11-25 DIAGNOSIS — S22080A Wedge compression fracture of T11-T12 vertebra, initial encounter for closed fracture: Secondary | ICD-10-CM | POA: Diagnosis not present

## 2020-11-28 ENCOUNTER — Other Ambulatory Visit: Payer: Self-pay

## 2020-11-28 ENCOUNTER — Emergency Department
Admission: EM | Admit: 2020-11-28 | Discharge: 2020-11-28 | Disposition: A | Discharge status: Home / Self Care | Payer: Medicare Other | Attending: Emergency Medicine | Admitting: Emergency Medicine

## 2020-11-28 ENCOUNTER — Encounter: Payer: Self-pay | Admitting: Intensive Care

## 2020-11-28 DIAGNOSIS — Z96669 Presence of unspecified artificial ankle joint: Secondary | ICD-10-CM | POA: Insufficient documentation

## 2020-11-28 DIAGNOSIS — Z794 Long term (current) use of insulin: Secondary | ICD-10-CM | POA: Diagnosis not present

## 2020-11-28 DIAGNOSIS — R031 Nonspecific low blood-pressure reading: Secondary | ICD-10-CM | POA: Diagnosis not present

## 2020-11-28 DIAGNOSIS — I10 Essential (primary) hypertension: Secondary | ICD-10-CM | POA: Diagnosis not present

## 2020-11-28 DIAGNOSIS — E1151 Type 2 diabetes mellitus with diabetic peripheral angiopathy without gangrene: Secondary | ICD-10-CM | POA: Diagnosis not present

## 2020-11-28 DIAGNOSIS — E11319 Type 2 diabetes mellitus with unspecified diabetic retinopathy without macular edema: Secondary | ICD-10-CM | POA: Insufficient documentation

## 2020-11-28 DIAGNOSIS — E860 Dehydration: Secondary | ICD-10-CM | POA: Insufficient documentation

## 2020-11-28 DIAGNOSIS — Z87891 Personal history of nicotine dependence: Secondary | ICD-10-CM | POA: Insufficient documentation

## 2020-11-28 DIAGNOSIS — Z79899 Other long term (current) drug therapy: Secondary | ICD-10-CM | POA: Diagnosis not present

## 2020-11-28 DIAGNOSIS — J449 Chronic obstructive pulmonary disease, unspecified: Secondary | ICD-10-CM | POA: Insufficient documentation

## 2020-11-28 LAB — CBC WITH DIFFERENTIAL/PLATELET
Abs Immature Granulocytes: 0.02 10*3/uL (ref 0.00–0.07)
Basophils Absolute: 0 10*3/uL (ref 0.0–0.1)
Basophils Relative: 1 %
Eosinophils Absolute: 0 10*3/uL (ref 0.0–0.5)
Eosinophils Relative: 0 %
HCT: 32.6 % — ABNORMAL LOW (ref 39.0–52.0)
Hemoglobin: 11 g/dL — ABNORMAL LOW (ref 13.0–17.0)
Immature Granulocytes: 0 %
Lymphocytes Relative: 40 %
Lymphs Abs: 1.9 10*3/uL (ref 0.7–4.0)
MCH: 33.2 pg (ref 26.0–34.0)
MCHC: 33.7 g/dL (ref 30.0–36.0)
MCV: 98.5 fL (ref 80.0–100.0)
Monocytes Absolute: 0.6 10*3/uL (ref 0.1–1.0)
Monocytes Relative: 13 %
Neutro Abs: 2.1 10*3/uL (ref 1.7–7.7)
Neutrophils Relative %: 46 %
Platelets: 290 10*3/uL (ref 150–400)
RBC: 3.31 MIL/uL — ABNORMAL LOW (ref 4.22–5.81)
RDW: 15.7 % — ABNORMAL HIGH (ref 11.5–15.5)
WBC: 4.7 10*3/uL (ref 4.0–10.5)
nRBC: 0 % (ref 0.0–0.2)

## 2020-11-28 LAB — COMPREHENSIVE METABOLIC PANEL
ALT: 10 U/L (ref 0–44)
AST: 35 U/L (ref 15–41)
Albumin: 2.3 g/dL — ABNORMAL LOW (ref 3.5–5.0)
Alkaline Phosphatase: 103 U/L (ref 38–126)
Anion gap: 9 (ref 5–15)
BUN: 22 mg/dL (ref 8–23)
CO2: 23 mmol/L (ref 22–32)
Calcium: 8.2 mg/dL — ABNORMAL LOW (ref 8.9–10.3)
Chloride: 104 mmol/L (ref 98–111)
Creatinine, Ser: 1.35 mg/dL — ABNORMAL HIGH (ref 0.61–1.24)
GFR, Estimated: 58 mL/min — ABNORMAL LOW (ref >60)
Glucose, Bld: 53 mg/dL — ABNORMAL LOW (ref 70–99)
Potassium: 4.5 mmol/L (ref 3.5–5.1)
Sodium: 136 mmol/L (ref 135–145)
Total Bilirubin: 1.1 mg/dL (ref 0.3–1.2)
Total Protein: 5.2 g/dL — ABNORMAL LOW (ref 6.5–8.1)

## 2020-11-28 LAB — CBG MONITORING, ED: Glucose-Capillary: 82 mg/dL (ref 70–99)

## 2020-11-28 MED ORDER — SODIUM CHLORIDE 0.9 % IV BOLUS
1000.0000 mL | Freq: Once | INTRAVENOUS | Status: AC
  Administered 2020-11-28: 1000 mL via INTRAVENOUS

## 2020-11-28 NOTE — ED Provider Notes (Signed)
[facility] Emergency Department Provider Note  ____________________________________________   Event Date/Time   First MD Initiated Contact with Patient 11/28/20 1637     (approximate)  I have reviewed the triage vital signs and the nursing notes.   HISTORY  Chief Complaint Abnormal Lab    HPI John Reilly is a 65 y.o. male presents emergency department complaining of dehydration.  Patient was recently at his 52 office where they noticed that his heart rate was elevated and his blood pressure was low.  They wanted him to come to the ED for fluids.  Patient was visiting due to the recent MRI results which showed a T12 compression fracture.  He denies any chest pain or shortness of breath.  Patient is a insulin-dependent diabetic.  He uses a pump  Past Medical History:  Diagnosis Date   Cancer ([place])    Pt reports on 10/02/17 that he has never been dx with cancer   COPD (chronic obstructive pulmonary disease) ([place])    Diabetes mellitus without complication (HCC)    GERD (gastroesophageal reflux disease)    Hypertension    Multiple duodenal ulcers    Peripheral vascular disease ([place])    Peripheral Neuropathy   Retinopathy     Patient Active Problem List   Diagnosis Date Noted   AMS (altered mental status) 03/21/2020   Hypoglycemia due to insulin 03/21/2020   Type 2 diabetes mellitus with vascular disease ([place]) 03/11/2020   Pain due to onychomycosis of toenails of both feet 09/07/2019   Dermatitis 09/07/2019   S/p left hip fracture 08/10/2018   Altered mental status    Closed fracture of left hip ([place])    Fall    DKA (diabetic ketoacidoses) 10/13/2017   Malnutrition of moderate degree 10/04/2017   Humeral fracture 10/03/2017   Ulcer of toe ([place]) 04/27/2015   Hyponatremia 03/15/2015    Past Surgical History:  Procedure Laterality Date   ANKLE ARTHROPLASTY     2013   COLONOSCOPY WITH PROPOFOL N/A 10/12/2016   Procedure: COLONOSCOPY  WITH PROPOFOL;  Surgeon: Manya Silvas, MD;  Location: [facility] ENDOSCOPY;  Service: Endoscopy;  Laterality: N/A;   INTRAMEDULLARY (IM) NAIL INTERTROCHANTERIC Left 08/08/2018   Procedure: INTRAMEDULLARY (IM) NAIL INTERTROCHANTRIC;  Surgeon: Corky Mull, MD;  Location: [facility] ORS;  Service: Orthopedics;  Laterality: Left;    Prior to Admission medications   Medication Sig Start Date End Date Taking? Authorizing Provider  amLODipine (NORVASC) 5 MG tablet Take 5 mg by mouth daily.    [provider]  azelastine (ASTELIN) 0.1 % nasal spray Place 1 spray into both nostrils 2 (two) times daily.  03/06/20 03/06/21  [provider]  benzonatate (TESSALON) 200 MG capsule Take 200 mg by mouth 3 (three) times daily as needed for cough. 03/06/20   [provider]  carbidopa-levodopa (SINEMET CR) 50-200 MG tablet Take 1 tablet by mouth at bedtime.     [provider]  carbidopa-levodopa (SINEMET IR) 25-250 MG tablet Take 1 tablet by mouth 4 (four) times daily.     [provider]  Cholecalciferol 25 MCG (1000 UT) tablet Take by mouth.    [provider]  folic acid (FOLVITE) [id number] MCG tablet Take 800 mcg by mouth daily.     [provider]  furosemide (LASIX) 40 MG tablet Take 40 mg by mouth daily. 07/26/19   [provider]  glucagon 1 MG injection  03/21/20   [provider]  Insulin Infusion  Pump Supplies (PARADIGM PUMP RESERVOIR 1.8ML) MISC 1 Device as directed.  09/07/18   [provider]  metoprolol succinate (TOPROL-XL) 50 MG 24 hr tablet Take 50 mg by mouth daily.     [provider]  Multiple Vitamin (MULTIVITAMIN WITH MINERALS) TABS tablet Take 1 tablet by mouth daily.    [provider]  omeprazole (PRILOSEC) 40 MG capsule Take 40 mg by mouth daily.     [provider]  pregabalin (LYRICA) 25 MG capsule Take 25 mg by mouth 2 (two) times daily. 11/29/19   [provider]   rivastigmine (EXELON) 1.5 MG capsule Take 1.5 mg by mouth 2 (two) times daily. 02/21/20 02/20/21  [provider]    Allergies Penicillins and Sulfa antibiotics  Family History  Problem Relation Age of Onset   Stroke Mother    Alzheimer's disease Father    Diabetes Brother     Social History Social History   Tobacco Use   Smoking status: Former    Types: Cigarettes    Quit date: 10/21/2005    Years since quitting: 15.1   Smokeless tobacco: Never  Vaping Use   Vaping Use: Never used  Substance Use Topics   Alcohol use: Yes    Alcohol/week: 21.0 standard drinks    Types: 21 Cans of beer per week    Comment: 1 40ounce beer a day   Drug use: Not Currently    Types: Marijuana    Comment: prescribed tramadol    Review of Systems  Constitutional: No fever/chills Eyes: No visual changes. ENT: No sore throat. Respiratory: Denies cough Cardiovascular: Denies chest pain Gastrointestinal: Denies abdominal pain Genitourinary: Negative for dysuria. Musculoskeletal: Negative for back pain. Skin: Negative for rash. Psychiatric: no mood changes,     ____________________________________________   PHYSICAL EXAM:  VITAL SIGNS: ED Triage Vitals  Enc Vitals Group     BP 11/28/20 1633 98/61     Pulse Rate 11/28/20 1633 (!) 109     Resp 11/28/20 1633 18     Temp 11/28/20 1633 97.7 F (36.5 C)     Temp Source 11/28/20 1633 Oral     SpO2 11/28/20 1633 98 %     Weight 11/28/20 1649 128 lb 9.6 oz (58.3 kg)     Height 11/28/20 1649 '5\' 10"'$  (1.778 m)     Head Circumference --      Peak Flow --      Pain Score 11/28/20 1649 10     Pain Loc --      Pain Edu? --      Excl. in [place]? --     Constitutional: Alert and oriented. Well appearing and in no acute distress. Eyes: Conjunctivae are normal.  Head: Atraumatic. Nose: No congestion/rhinnorhea. Mouth/Throat: Mucous membranes are moist.   Neck:  supple no lymphadenopathy noted Cardiovascular: Normal rate, regular  rhythm. Heart sounds are normal Respiratory: Normal respiratory effort.  No retractions, lungs c t a  Abd: soft nontender bs normal all 4 quad GU: deferred Musculoskeletal: FROM all extremities, warm and well perfused Neurologic:  Normal speech and language.  Skin:  Skin is warm, dry and intact. No rash noted. Psychiatric: Mood and affect are normal. Speech and behavior are normal.  ____________________________________________   LABS (all labs ordered are listed, but only abnormal results are displayed)  Labs Reviewed  COMPREHENSIVE METABOLIC PANEL - Abnormal; Notable for the following components:      Result Value   Glucose, Bld 53 (*)  Creatinine, Ser 1.35 (*)    Calcium 8.2 (*)    Total Protein 5.2 (*)    Albumin 2.3 (*)    GFR, Estimated 58 (*)    All other components within normal limits  CBC WITH DIFFERENTIAL/PLATELET - Abnormal; Notable for the following components:   RBC 3.31 (*)    Hemoglobin 11.0 (*)    HCT 32.6 (*)    RDW 15.7 (*)    All other components within normal limits  CBG MONITORING, ED   ____________________________________________   ____________________________________________  RADIOLOGY    ____________________________________________   PROCEDURES  Procedure(s) performed: No  Procedures    ____________________________________________   INITIAL IMPRESSION / ASSESSMENT AND PLAN / ED COURSE  Pertinent labs & imaging results that were available during my care of the patient were reviewed by me and considered in my medical decision making (see chart for details).   Patient [age]-year-old male presents emergency department with questionable dehydration.  See HPI.  Physical exam shows patient appears stable  DDx: Dehydration, hyperglycemia, hypoglycemia  CBC, metabolic panel ordered  Patient is little tachycardic so we will give him 1 L normal saline IV  Clinical Course as of 11/28/20 1859  Thu Nov 28, 2020  1745 Glucose(!): 53 [SF]     Clinical Course User Index [SF] Versie Starks, PA-C   Patient is on glucose pump and monitor showed his glucose to be at 80.  CBG showed his glucose to be at 80.  He does not want to eat any sugar or have D5W.  EKG on arrival showed A. fib, rhythm strips from the monitor showed normal sinus rhythm, see physician read  Patient finished his fluids.  He is able to ambulate around the room and felt much better.  He is able to move his head without being dizzy.  States he is ready to go home.  He was discharged stable condition with instructions to drink plenty of fluids and return emergency department worsening.    John Reilly was evaluated in Emergency Department on 11/28/2020 for the symptoms described in the history of present illness. He was evaluated in the context of the global COVID-19 pandemic, which necessitated consideration that the patient might be at risk for infection with the SARS-CoV-2 virus that causes COVID-19. Institutional protocols and algorithms that pertain to the evaluation of patients at risk for COVID-19 are in a state of rapid change based on information released by regulatory bodies including the CDC and federal and state organizations. These policies and algorithms were followed during the patient's care in the ED.    As part of my medical decision making, I reviewed the following data within the [place] notes reviewed and incorporated, Labs reviewed , EKG interpreted atrial fibrillation, Old EKG reviewed, Old chart reviewed, Evaluated by EM attending , Notes from prior ED visits, and [place] Controlled Substance Database  ____________________________________________   FINAL CLINICAL IMPRESSION(S) / ED DIAGNOSES  Final diagnoses:  Dehydration      NEW MEDICATIONS STARTED DURING THIS VISIT:  New Prescriptions   No medications on file     Note:  This document was prepared using Dragon voice recognition software and may include  unintentional dictation errors.    Versie Starks, PA-C 11/28/20 1901    Vanessa [place], MD 11/28/20 2012

## 2020-11-28 NOTE — ED Triage Notes (Signed)
Patient reports coming to ER for back pain. Also states he was at follow up appointment today and told he was tachy and had hypotension. MRI completed Monday and found issues with back. Patient poor historian

## 2020-11-28 NOTE — ED Notes (Signed)
Pt assisted into a wheelchair and escorted to his wife's vehicle by this RN.

## 2020-12-09 ENCOUNTER — Other Ambulatory Visit: Payer: Self-pay | Admitting: Orthopedic Surgery

## 2020-12-11 ENCOUNTER — Encounter
Admission: RE | Admit: 2020-12-11 | Discharge: 2020-12-11 | Disposition: A | Discharge status: Home / Self Care | Payer: Medicare Other | Source: Ambulatory Visit | Attending: Orthopedic Surgery | Admitting: Orthopedic Surgery

## 2020-12-11 ENCOUNTER — Other Ambulatory Visit: Payer: Self-pay

## 2020-12-11 HISTORY — DX: Personal history of other malignant neoplasm of large intestine: Z85.038

## 2020-12-11 HISTORY — DX: Parkinson's disease without dyskinesia, without mention of fluctuations: G20.A1

## 2020-12-11 HISTORY — DX: Fracture of unspecified part of neck of unspecified femur, initial encounter for closed fracture: S72.009A

## 2020-12-11 HISTORY — DX: Polyp of colon: K63.5

## 2020-12-11 HISTORY — DX: Parkinson's disease: G20

## 2020-12-11 NOTE — Patient Instructions (Addendum)
Your procedure is scheduled on: Thursday, August 4 Report to the Registration Desk on the 1st floor of the [facility] at 12 pm  REMEMBER: Instructions that are not followed completely may result in serious medical risk, up to and including death; or upon the discretion of your surgeon and anesthesiologist your surgery may need to be rescheduled.  Do not eat food after midnight the night before surgery.  No gum chewing, lozengers or hard candies.  You may however, drink CLEAR liquids up to 2 hours before you are scheduled to arrive for your surgery. Do not drink anything within 2 hours of your scheduled arrival time.  Clear liquids include: - water  - apple juice without pulp - gatorade (not RED, PURPLE, OR BLUE) - black coffee or tea (Do NOT add milk or creamers to the coffee or tea) Do NOT drink anything that is not on this list.  TAKE THESE MEDICATIONS THE MORNING OF SURGERY WITH A SIP OF WATER:  Carbidopa-levodopa Metoprolol Omeprazole  - (take one the night before and one on the morning of surgery - helps to prevent nausea after surgery.)  Leave insulin pump on.  One week prior to surgery: Stop Anti-inflammatories (NSAIDS) such as Advil, Aleve, Ibuprofen, Motrin, Naproxen, Naprosyn and Aspirin based products such as Excedrin, Goodys Powder, BC Powder. Stop ANY OVER THE COUNTER supplements until after surgery. You may however, continue to take Tylenol if needed for pain up until the day of surgery.  No Alcohol for 24 hours before or after surgery.  No Smoking including e-cigarettes for 24 hours prior to surgery.  No chewable tobacco products for at least 6 hours prior to surgery.  No nicotine patches on the day of surgery.  Do not use any "recreational" drugs for at least a week prior to your surgery.  Please be advised that the combination of cocaine and anesthesia may have negative outcomes, up to and including death. If you test positive for cocaine, your surgery will  be cancelled.  On the morning of surgery brush your teeth with toothpaste and water, you may rinse your mouth with mouthwash if you wish. Do not swallow any toothpaste or mouthwash.  Do not wear jewelry, make-up, hairpins, clips or nail polish.  Do not wear lotions, powders, or perfumes.   Do not shave body from the neck down 48 hours prior to surgery just in case you cut yourself which could leave a site for infection.  Also, freshly shaved skin may become irritated if using the CHG soap.  Contact lenses, hearing aids and dentures may not be worn into surgery.  Do not bring valuables to the hospital. [facility] is not responsible for any missing/lost belongings or valuables.   Shower using antibacterial soap prior to coming to the hospital on the day of surgery.  Notify your doctor if there is any change in your medical condition (cold, fever, infection).  Wear comfortable clothing (specific to your surgery type) to the hospital.  After surgery, you can help prevent lung complications by doing breathing exercises.  Take deep breaths and cough every 1-2 hours. Your doctor may order a device called an Incentive Spirometer to help you take deep breaths.  If you are being discharged the day of surgery, you will not be allowed to drive home. You will need a responsible adult (18 years or older) to drive you home and stay with you that night.   If you are taking public transportation, you will need to have a  responsible adult (18 years or older) with you. Please confirm with your physician that it is acceptable to use public transportation.   Please call the [facility] at [phone] if you have any questions about these instructions.  Surgery Visitation Policy:  Patients undergoing a surgery or procedure may have one family member or support person with them as long as that person is not COVID-19 positive or experiencing its symptoms.  That person may remain in  the waiting area during the procedure.

## 2020-12-12 ENCOUNTER — Ambulatory Visit
Admission: RE | Admit: 2020-12-12 | Discharge: 2020-12-12 | Disposition: A | Discharge status: Home / Self Care | Payer: Medicare Other | Attending: Orthopedic Surgery | Admitting: Orthopedic Surgery

## 2020-12-12 ENCOUNTER — Encounter: Payer: Self-pay | Admitting: Orthopedic Surgery

## 2020-12-12 ENCOUNTER — Encounter
Admission: RE | Disposition: A | Discharge status: Home / Self Care | Payer: Self-pay | Source: Home / Self Care | Attending: Orthopedic Surgery

## 2020-12-12 ENCOUNTER — Other Ambulatory Visit: Payer: Self-pay

## 2020-12-12 ENCOUNTER — Ambulatory Visit: Payer: Medicare Other | Admitting: Anesthesiology

## 2020-12-12 ENCOUNTER — Ambulatory Visit: Payer: Medicare Other

## 2020-12-12 DIAGNOSIS — Z794 Long term (current) use of insulin: Secondary | ICD-10-CM | POA: Diagnosis not present

## 2020-12-12 DIAGNOSIS — G20 Parkinson's disease: Secondary | ICD-10-CM | POA: Diagnosis not present

## 2020-12-12 DIAGNOSIS — Z880 Allergy status to penicillin: Secondary | ICD-10-CM | POA: Insufficient documentation

## 2020-12-12 DIAGNOSIS — M8008XA Age-related osteoporosis with current pathological fracture, vertebra(e), initial encounter for fracture: Secondary | ICD-10-CM | POA: Insufficient documentation

## 2020-12-12 DIAGNOSIS — Z87891 Personal history of nicotine dependence: Secondary | ICD-10-CM | POA: Insufficient documentation

## 2020-12-12 DIAGNOSIS — W19XXXA Unspecified fall, initial encounter: Secondary | ICD-10-CM | POA: Diagnosis not present

## 2020-12-12 DIAGNOSIS — Z79899 Other long term (current) drug therapy: Secondary | ICD-10-CM | POA: Diagnosis not present

## 2020-12-12 DIAGNOSIS — Z882 Allergy status to sulfonamides status: Secondary | ICD-10-CM | POA: Insufficient documentation

## 2020-12-12 DIAGNOSIS — Z419 Encounter for procedure for purposes other than remedying health state, unspecified: Secondary | ICD-10-CM

## 2020-12-12 DIAGNOSIS — S22080A Wedge compression fracture of T11-T12 vertebra, initial encounter for closed fracture: Secondary | ICD-10-CM | POA: Diagnosis present

## 2020-12-12 HISTORY — PX: KYPHOPLASTY: SHX5884

## 2020-12-12 LAB — GLUCOSE, CAPILLARY
Glucose-Capillary: 74 mg/dL (ref 70–99)
Glucose-Capillary: 60 mg/dL — ABNORMAL LOW (ref 70–99)
Glucose-Capillary: 91 mg/dL (ref 70–99)
Glucose-Capillary: 64 mg/dL — ABNORMAL LOW (ref 70–99)
Glucose-Capillary: 94 mg/dL (ref 70–99)
Glucose-Capillary: 81 mg/dL (ref 70–99)

## 2020-12-12 SURGERY — KYPHOPLASTY
Anesthesia: General

## 2020-12-12 MED ORDER — HYDROCODONE-ACETAMINOPHEN 5-325 MG PO TABS: 1.0000 | ORAL_TABLET | Freq: Four times a day (QID) | ORAL | 0 refills | Status: DC | PRN

## 2020-12-12 MED ORDER — 0.9 % SODIUM CHLORIDE (POUR BTL) OPTIME
TOPICAL | Status: DC | PRN
  Administered 2020-12-12: 200 mL

## 2020-12-12 MED ORDER — BUPIVACAINE HCL (PF) 0.5 % IJ SOLN
INTRAMUSCULAR | Status: AC
  Filled 2020-12-12: qty 30

## 2020-12-12 MED ORDER — MIDAZOLAM HCL 2 MG/2ML IJ SOLN
INTRAMUSCULAR | Status: DC | PRN
  Administered 2020-12-12: 2 mg via INTRAVENOUS

## 2020-12-12 MED ORDER — LIDOCAINE HCL (PF) 1 % IJ SOLN
INTRAMUSCULAR | Status: AC
  Filled 2020-12-12: qty 30

## 2020-12-12 MED ORDER — PROPOFOL 500 MG/50ML IV EMUL
INTRAVENOUS | Status: DC | PRN
  Administered 2020-12-12: 100 ug/kg/min via INTRAVENOUS

## 2020-12-12 MED ORDER — CLINDAMYCIN PHOSPHATE 900 MG/50ML IV SOLN: 900.0000 mg | INTRAVENOUS | Status: DC

## 2020-12-12 MED ORDER — OXYCODONE HCL 5 MG/5ML PO SOLN: 5.0000 mg | Freq: Once | ORAL | Status: DC | PRN

## 2020-12-12 MED ORDER — GLYCOPYRROLATE 0.2 MG/ML IJ SOLN
INTRAMUSCULAR | Status: DC | PRN
  Administered 2020-12-12: .2 mg via INTRAVENOUS

## 2020-12-12 MED ORDER — ACETAMINOPHEN 10 MG/ML IV SOLN: 1000.0000 mg | Freq: Once | INTRAVENOUS | Status: DC | PRN

## 2020-12-12 MED ORDER — ORAL CARE MOUTH RINSE: 15.0000 mL | Freq: Once | OROMUCOSAL | Status: AC

## 2020-12-12 MED ORDER — OXYCODONE HCL 5 MG PO TABS: 5.0000 mg | ORAL_TABLET | Freq: Once | ORAL | Status: DC | PRN

## 2020-12-12 MED ORDER — SODIUM CHLORIDE 0.9 % IV SOLN: INTRAVENOUS | Status: DC

## 2020-12-12 MED ORDER — LIDOCAINE HCL 1 % IJ SOLN
INTRAMUSCULAR | Status: DC | PRN
  Administered 2020-12-12: 10 mL
  Administered 2020-12-12: 20 mL

## 2020-12-12 MED ORDER — ONDANSETRON HCL 4 MG/2ML IJ SOLN: 4.0000 mg | Freq: Once | INTRAMUSCULAR | Status: DC | PRN

## 2020-12-12 MED ORDER — DEXTROSE 50 % IV SOLN
0.5000 | Freq: Once | INTRAVENOUS | Status: AC
  Administered 2020-12-12: 25 mL via INTRAVENOUS

## 2020-12-12 MED ORDER — FENTANYL CITRATE (PF) 100 MCG/2ML IJ SOLN
INTRAMUSCULAR | Status: DC | PRN
  Administered 2020-12-12 (×2): 50 ug via INTRAVENOUS

## 2020-12-12 MED ORDER — FENTANYL CITRATE (PF) 100 MCG/2ML IJ SOLN
INTRAMUSCULAR | Status: AC
  Filled 2020-12-12: qty 2

## 2020-12-12 MED ORDER — MIDAZOLAM HCL 2 MG/2ML IJ SOLN
INTRAMUSCULAR | Status: AC
  Filled 2020-12-12: qty 2

## 2020-12-12 MED ORDER — CHLORHEXIDINE GLUCONATE 0.12 % MT SOLN: 15.0000 mL | Freq: Once | OROMUCOSAL | Status: AC

## 2020-12-12 MED ORDER — CHLORHEXIDINE GLUCONATE 0.12 % MT SOLN
OROMUCOSAL | Status: AC
  Administered 2020-12-12: 15 mL via OROMUCOSAL
  Filled 2020-12-12: qty 15

## 2020-12-12 MED ORDER — DEXTROSE 50 % IV SOLN
INTRAVENOUS | Status: AC
  Administered 2020-12-12: 25 mL via INTRAVENOUS
  Filled 2020-12-12: qty 50

## 2020-12-12 MED ORDER — PHENYLEPHRINE HCL (PRESSORS) 10 MG/ML IV SOLN
INTRAVENOUS | Status: DC | PRN
  Administered 2020-12-12: 100 ug via INTRAVENOUS
  Administered 2020-12-12 (×2): 200 ug via INTRAVENOUS
  Administered 2020-12-12: 100 ug via INTRAVENOUS

## 2020-12-12 MED ORDER — PROPOFOL 1000 MG/100ML IV EMUL
INTRAVENOUS | Status: AC
  Filled 2020-12-12: qty 100

## 2020-12-12 MED ORDER — DEXTROSE 50 % IV SOLN: 25.0000 mL | Freq: Once | INTRAVENOUS | Status: AC

## 2020-12-12 MED ORDER — IOHEXOL 180 MG/ML  SOLN
INTRAMUSCULAR | Status: DC | PRN
  Administered 2020-12-12: 20 mL

## 2020-12-12 MED ORDER — CLINDAMYCIN PHOSPHATE 900 MG/50ML IV SOLN
INTRAVENOUS | Status: AC
  Filled 2020-12-12: qty 50

## 2020-12-12 MED ORDER — BUPIVACAINE-EPINEPHRINE (PF) 0.5% -1:200000 IJ SOLN
INTRAMUSCULAR | Status: DC | PRN
  Administered 2020-12-12: 20 mL via PERINEURAL

## 2020-12-12 MED ORDER — FENTANYL CITRATE (PF) 100 MCG/2ML IJ SOLN: 25.0000 ug | INTRAMUSCULAR | Status: DC | PRN

## 2020-12-12 SURGICAL SUPPLY — 23 items
ADH SKN CLS APL DERMABOND .7 (GAUZE/BANDAGES/DRESSINGS) ×1
CEMENT KYPHON CX01A KIT/MIXER (Cement) ×2 IMPLANT
DERMABOND ADVANCED (GAUZE/BANDAGES/DRESSINGS) ×1
DERMABOND ADVANCED .7 DNX12 (GAUZE/BANDAGES/DRESSINGS) ×1 IMPLANT
DEVICE BIOPSY BONE KYPHX (INSTRUMENTS) ×2 IMPLANT
DRAPE C-ARM XRAY 36X54 (DRAPES) ×2 IMPLANT
DURAPREP 26ML APPLICATOR (WOUND CARE) ×2 IMPLANT
FEE RENTAL RFA GENERATOR (MISCELLANEOUS) IMPLANT
GAUZE 4X4 16PLY ~~LOC~~+RFID DBL (SPONGE) ×2 IMPLANT
GLOVE SURG SYN 9.0  PF PI (GLOVE) ×1
GLOVE SURG SYN 9.0 PF PI (GLOVE) ×1 IMPLANT
GOWN SRG 2XL LVL 4 RGLN SLV (GOWNS) ×1 IMPLANT
GOWN STRL NON-REIN 2XL LVL4 (GOWNS) ×2
GOWN STRL REUS W/ TWL LRG LVL3 (GOWN DISPOSABLE) ×1 IMPLANT
GOWN STRL REUS W/TWL LRG LVL3 (GOWN DISPOSABLE) ×2
MANIFOLD NEPTUNE II (INSTRUMENTS) ×2 IMPLANT
PACK KYPHOPLASTY (MISCELLANEOUS) ×2 IMPLANT
RENTAL RFA GENERATOR (MISCELLANEOUS) IMPLANT
STRAP SAFETY 5IN WIDE (MISCELLANEOUS) ×2 IMPLANT
SWABSTK COMLB BENZOIN TINCTURE (MISCELLANEOUS) ×2 IMPLANT
TRAY KYPHOPAK 15/2 EXPRESS (KITS) ×1 IMPLANT
TRAY KYPHOPAK 15/3 EXPRESS 1ST (MISCELLANEOUS) ×1 IMPLANT
TRAY KYPHOPAK 20/3 EXPRESS 1ST (MISCELLANEOUS) ×1 IMPLANT

## 2020-12-12 NOTE — Anesthesia Postprocedure Evaluation (Signed)
Anesthesia Post Note  Patient: John Reilly  Procedure(s) Performed: T 12 KYPHOPLASTY  Patient location during evaluation: PACU Anesthesia Type: General Level of consciousness: awake and alert Pain management: pain level controlled Vital Signs Assessment: post-procedure vital signs reviewed and stable Respiratory status: spontaneous breathing, nonlabored ventilation, respiratory function stable and patient connected to nasal cannula oxygen Cardiovascular status: blood pressure returned to baseline and stable Postop Assessment: no apparent nausea or vomiting Anesthetic complications: no   No notable events documented.   Last Vitals:  Vitals:   12/12/20 1515 12/12/20 1530  BP: 138/85 135/89  Pulse: 88 90  Resp: 12 14  Temp:  (!) 36.3 C  SpO2: 100% 100%    Last Pain:  Vitals:   12/12/20 1530  TempSrc:   PainSc: 0-No pain                 Arita Miss

## 2020-12-12 NOTE — H&P (Signed)
Chief Complaint  Patient presents with   Middle Back - Pain    History of the Present Illness: John Reilly is a 65 y.o. male here today.   The patient presents for evaluation of a new T12 fracture. He had an MRI at [facility] on 11/25/2020 that showed nearly vertebral plana at T12. There is no evidence of abnormal signal, other than edema. There is some posterior bowing of the posterior wall with no compression on the spinal cord, but very little extra room looking at the [place]. He comes in today to discuss possible kyphoplasty. X-rays from 11/18/2020 here in the clinic show a vertebral plana at T12. This was not present on an 02/2020 chest x-ray.  The patient states he has back pain that radiates around. He states he has Parkinson disease, and he fell a few times. He states he has a lift chair, and he puts pillows in it. He states sometimes he can put so many pillows in it that he slides out. The patient states he has not had a bone density scan done. 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 He states he has a minimum amount of pain when he gets up to the bathroom. The patient states he is not taking any pain medication presently; however, he was taking tramadol.  The patient presents with a male companion who states he has pain in his arm, and trouble walking with his head up. She notes he sometimes cannot raise his arms.   The patient has a history of shoulder fractures bilaterally, a fractured hip and a fractured ankle.   The patient states his blood glucose right now is 106. He is not on a blood thinner.  I have reviewed past medical, surgical, social and family history, and allergies as documented in the EMR.  Past Medical History: Past Medical History:  Diagnosis Date   COPD (chronic obstructive pulmonary disease) (CMS-HCC)  COPD on chest x-ray. Used to smoke, now a nonsmoker.   Diabetes mellitus type I (CMS-HCC)   History of adenomatous polyp of colon 07/21/2016   History of alcohol  abuse   History of colon polyps   History of malignant neoplasm of colon  intermucosal adenocarcinoma   History of retinopathy  background retinopathy   Hypertension   Multiple duodenal ulcers  on endoscopy   Peripheral neuropathy   REM behavioral disorder   Tremor   Past Surgical History: Past Surgical History:  Procedure Laterality Date   COLONOSCOPY 09/01/1996  Adenomatous Polyp w/Intramucosal Adenocarcinoma   COLONOSCOPY 05/02/1997  Adenomatous Polyp w/Severe Dysplasia   COLONOSCOPY 06/11/2000, 01/11/1998  [facility]   COLONOSCOPY 04/10/2008, 11/14/2004  Adenomatous Polyps, [facility]   COLONOSCOPY 07/09/2011  [facility], [facility] Adenomatous Polyps: [facility] 06/2016; Recall Ltr mailed 05/07/2016 (dw)   COLONOSCOPY 10/12/2016  Adenomatous Polyps, [facility]: [facility] 10/2021   EGD 07/09/2011  No repeat per RTE   FLEXIBLE SIGMOIDOSCOPY 10/08/1997, 04/17/1997, 08/29/1996   OTHER SURGERY August 2013  Right ankle fracture - required surgery - treated at [facility] - had secondayr infection   OTHER SURGERY  Right thumb repair due to crushing injury 05/09. Patient has metal hardware in right thumb.   Reduction and internal fixation of closed displaced 4 part intertrochanteric left hip fracture with biomet affixis TFN nail Left 08/08/2018  Dr.poggi   Past Family History: Family History  Problem Relation Age of Onset   Stroke Mother   Alzheimer's disease Father   Diabetes Brother   Ulcers Brother  ulcer disease   Colon cancer  Brother   Colon polyps Other  several other brothers   Medications: Current Outpatient Medications Ordered in Epic  Medication Sig Dispense Refill   blood glucose diagnostic (CONTOUR NEXT TEST STRIPS) test strip Use to check blood sugar three times daily 300 strip 3   carbidopa-levodopa (SINEMET) 25-250 mg tablet Take 1 tablet by mouth 4 (four) times daily for 90 days 360 tablet 3   fludrocortisone (FLORINEF) 0.1 mg tablet Take 2 tablets (0.2 mg total) by mouth once daily 180 tablet 1    insulin LISPRO (HUMALOG U-100 INSULIN) injection (concentration 100 units/mL) Up to 55 units in pump daily. 50 mL 4   insulin pump syringe (PARADIGM RESERVOIR) 1.8 mL Misc Inject 1 Syringe as directed as directed quick set [23" 44minfusion set for paradigm pump] 90 each 3   metoprolol succinate (TOPROL-XL) 50 MG XL tablet Take 0.5 tablets (25 mg total) by mouth once daily 45 tablet 3   mirtazapine (REMERON) 7.5 MG tablet Take 1 tablet (7.5 mg total) by mouth nightly 30 tablet 6   omeprazole (PRILOSEC) 40 MG DR capsule Take 1 capsule (40 mg total) by mouth once daily 90 capsule 3   potassium chloride (KLOR-CON) 20 MEQ ER tablet Take 1 tablet (20 mEq total) by mouth once daily 30 tablet 11   rivastigmine tartrate (EXELON) 1.5 MG capsule Take 1 capsule (1.5 mg total) by mouth 2 (two) times daily with meals 60 capsule 11   traMADoL (ULTRAM) 50 mg tablet Take 1 tablet (50 mg total) by mouth every 8 (eight) hours as needed 15 tablet 0   carbidopa-levodopa (SINEMET CR) 50-200 mg CR tablet Take 1 tablet by mouth nightly (Patient not taking: Reported on 12/09/2020) 90 tablet 3   mupirocin (BACTROBAN) 2 % ointment Apply topically 3 (three) times daily (Patient not taking: Reported on 12/09/2020)   No current Epic-ordered facility-administered medications on file.   Allergies: Allergies  Allergen Reactions   Penicillins Rash  Reaction: Unknown; childhood reaction  Has patient had a PCN reaction causing immediate rash, facial/tongue/throat swelling, SOB or lightheadedness with hypotension: Yes Has patient had a PCN reaction causing severe rash involving mucus membranes or skin necrosis: No Has patient had a PCN reaction that required hospitalization: No Has patient had a PCN reaction occurring within the last 10 years: No If all of the above answers are "NO", then may proceed with Cephalosporin use.   Sulfa (Sulfonamide Antibiotics) Rash  SKatherina RightSyndrome (SJS)   Penicillin V Potassium Rash     Body mass index is 18.39 kg/m.  Review of Systems: A comprehensive 14 point ROS was performed, reviewed, and the pertinent orthopaedic findings are documented in the HPI.  Vitals:  12/09/20 1438  BP: 102/60    General Physical Examination:   General/Constitutional: No apparent distress: well-nourished and well developed. Eyes: Pupils equal, round with synchronous movement. Lungs: Clear to auscultation HEENT: Normal Vascular: No edema, swelling or tenderness, except as noted in detailed exam. Cardiac: Heart rate and rhythm is regular. Integumentary: No impressive skin lesions present, except as noted in detailed exam. Neuro/Psych: Normal mood and affect, oriented to person, place and time.  Musculoskeletal Examination:  On exam, tenderness to T12. No clonus.  Radiographs:  No new imaging studies were obtained today.  Assessment: ICD-10-CM  1. Thoracic compression fracture, closed, initial encounter (CMS-HCC) S22.000A   Plan:  The patient has clinical findings of severe T12 compression fracture.  We discussed the patient's prior x-ray findings and treatment options. I explained  he has a severe T12 compression fracture with significant persistent pain, as well as a large amount of edema of the anterior portion of the T12 body with vertebral plana. I recommend T12 kyphoplasty as an outpatient. I gave the patient a brochure on T12 kyphoplasty.  Surgical Risks:  The nature of the condition and the proposed procedure has been reviewed in detail with the patient. Surgical versus non-surgical options and prognosis for recovery have been reviewed and the inherent risks and benefits of each have been discussed including the risks of infection, bleeding, injury to nerves/blood vessels/tendons, incomplete relief of symptoms, persisting pain and/or stiffness, loss of function, complex regional pain syndrome, failure of the procedure, as appropriate.  Attestation: I, Dawn Royse,  am documenting for [employer], MD utilizing [place].    Electronically signed by Lauris Poag, MD at 12/09/2020 7:19 PM EDT  Reviewed  H+P. No changes noted.

## 2020-12-12 NOTE — OR Nursing (Signed)
Repeat BS is 60 on hospital monitor. Dr. Bertell Maria notified and 1/2 amp D50 ordered. D50 given as ordered.

## 2020-12-12 NOTE — Transfer of Care (Signed)
Immediate Anesthesia Transfer of Care Note  Patient: John Reilly  Procedure(s) Performed: T 12 KYPHOPLASTY  Patient Location: PACU  Anesthesia Type:General  Level of Consciousness: awake  Airway & Oxygen Therapy: Patient Spontanous Breathing and Patient connected to face mask oxygen  Post-op Assessment: Report given to RN  Post vital signs: stable  Last Vitals:  Vitals Value Taken Time  BP    Temp    Pulse    Resp 14 12/12/20 1449  SpO2    Vitals shown include unvalidated device data.  Last Pain:  Vitals:   12/12/20 1246  TempSrc: Temporal  PainSc: 1          Complications: No notable events documented.

## 2020-12-12 NOTE — OR Nursing (Signed)
Dr Rudene Christians called the patient's wife from the operating room to obtain consent for additional procedures to existing consent. I was a witness to additional consent and I added the verbal consent to written the consent. After the start of the procedure, the additional procedures were not needed.

## 2020-12-12 NOTE — Op Note (Signed)
12/12/2020  2:45 PM  PATIENT:  John Reilly   MRN: [id number]   PRE-OPERATIVE DIAGNOSIS:  closed wedge compression fracture of T12   POST-OPERATIVE DIAGNOSIS:  closed wedge compression fracture of T12   PROCEDURE:  Procedure(s): KYPHOPLASTY T12  SURGEON: Laurene Footman, MD   ASSISTANTS: None   ANESTHESIA:   local and MAC   EBL:  No intake/output data recorded.   BLOOD ADMINISTERED:none   DRAINS: none    LOCAL MEDICATIONS USED:  MARCAINE    and XYLOCAINE    SPECIMEN: T12 vertebral body biopsy   DISPOSITION OF SPECIMEN: Pathology   COUNTS:  YES   TOURNIQUET:  * No tourniquets in log *   IMPLANTS: Bone cement   DICTATION: .Dragon Dictation  patient was brought to the operating room and after adequate anesthesia was obtained the patient was placed prone.  C arm was brought in in good visualization of the affected level obtained on both AP and lateral projections.  After patient identification and timeout procedures were completed, local anesthetic was infiltrated with 10 cc 1% Xylocaine infiltrated subcutaneously.  This is done the area on the each side of the planned approach.  The back was then prepped and draped in the usual sterile manner and repeat timeout procedure carried out.  A spinal needle was brought down to the pedicle on the each side of T12 and a 50-50 mix of 1% Xylocaine half percent Sensorcaine with epinephrine total of 20 cc injected on each side.  After allowing this to set a small incision was made and the trocar was advanced into the vertebral body in an extrapedicular fashion.  Biopsy was obtained drilling was carried out balloon inserted with inflation to 1.5 cc on the right and 2 cc on the left.  When the cement was appropriate consistency 3 cc were injected on the right and 1.5 cc on the left into the vertebral body without extravasation, good fill superior to inferior endplates and from right to left sides along the inferior endplate.  After the cement had  set the trochar was removed and permanent C-arm views obtained.  The wound was closed with Dermabond followed by Band-Aid   PLAN OF CARE: Discharge home after recovery   PATIENT DISPOSITION:  PACU - hemodynamically stable.

## 2020-12-12 NOTE — Discharge Instructions (Addendum)
Take it easy today and try to start walking is much as you can tomorrow. Pain medicine as directed Remove Band-Aid on Saturday then okay to shower Call office if you are having problems   AMBULATORY SURGERY  DISCHARGE INSTRUCTIONS   The drugs that you were given will stay in your system until tomorrow so for the next 24 hours you should not:  Drive an automobile Make any legal decisions Drink any alcoholic beverage   You may resume regular meals tomorrow.  Today it is better to start with liquids and gradually work up to solid foods.  You may eat anything you prefer, but it is better to start with liquids, then soup and crackers, and gradually work up to solid foods.   Please notify your doctor immediately if you have any unusual bleeding, trouble breathing, redness and pain at the surgery site, drainage, fever, or pain not relieved by medication.    Additional Instructions:        Please contact your physician with any problems or Same Day Surgery at [phone], Monday through Friday 6 am to 4 pm, or [place] at [facility] number at [phone].

## 2020-12-12 NOTE — Anesthesia Preprocedure Evaluation (Addendum)
Anesthesia Evaluation  Patient identified by MRN, date of birth, ID band Patient awake    Reviewed: Allergy & Precautions, NPO status , Patient's Chart, lab work & pertinent test results  History of Anesthesia Complications Negative for: history of anesthetic complications  Airway Mallampati: II  TM Distance: >3 FB Neck ROM: Full    Dental  (+) Poor Dentition, Missing, Partial Upper, Chipped   Pulmonary neg pulmonary ROS, neg sleep apnea, neg COPD, Patient abstained from smoking.Not current smoker, former smoker,    Pulmonary exam normal breath sounds clear to auscultation       Cardiovascular Exercise Tolerance: Poor METShypertension, + Peripheral Vascular Disease  (-) CAD and (-) Past MI (-) dysrhythmias  Rhythm:Regular Rate:Normal - Systolic murmurs TTE [id number]: 1. Left ventricular ejection fraction, by estimation, is 60 to 65%. The  left ventricle has normal function. The left ventricle has no regional  wall motion abnormalities. Left ventricular diastolic parameters are  consistent with Grade I diastolic  dysfunction (impaired relaxation).  2. Right ventricular systolic function is normal. The right ventricular  size is normal.  3. Left atrial size was mildly dilated.  4. The mitral valve is normal in structure. Mild mitral valve  regurgitation.    Neuro/Psych Parkinson Disease  Neuromuscular disease negative psych ROS   GI/Hepatic PUD, GERD  Medicated and Controlled,(+)     (-) substance abuse  ,   Endo/Other  diabetes, Type 1, Insulin DependentInsulin pump  Renal/GU negative Renal ROS     Musculoskeletal   Abdominal   Peds  Hematology   Anesthesia Other Findings Past Medical History: No date: Cancer [facility])     Comment:  Pt reports on 10/02/17 that he has never been dx with               cancer No date: Colon polyps No date: COPD (chronic obstructive pulmonary disease) (HCC) No date: Diabetes  mellitus without complication (HCC)     Comment:  type 1 No date: GERD (gastroesophageal reflux disease) No date: Hip fracture (HCC) No date: History of malignant neoplasm of colon No date: Hypertension No date: Multiple duodenal ulcers No date: Parkinson disease ([place]) No date: Peripheral vascular disease ([place])     Comment:  Peripheral Neuropathy No date: Retinopathy 11/2020: T12 compression fracture (HCC)  Reproductive/Obstetrics                            Anesthesia Physical Anesthesia Plan  ASA: 3  Anesthesia Plan: General   Post-op Pain Management:    Induction: Intravenous  PONV Risk Score and Plan: 2 and Ondansetron, Propofol infusion, TIVA and Treatment may vary due to age or medical condition  Airway Management Planned: Nasal Cannula and Natural Airway  Additional Equipment: None  Intra-op Plan:   Post-operative Plan:   Informed Consent: I have reviewed the patients History and Physical, chart, labs and discussed the procedure including the risks, benefits and alternatives for the proposed anesthesia with the patient or authorized representative who has indicated his/her understanding and acceptance.     Dental advisory given  Plan Discussed with: CRNA and Surgeon  Anesthesia Plan Comments: (Discussed risks of anesthesia with patient, including possibility of difficulty with spontaneous ventilation under anesthesia necessitating airway intervention, PONV, and rare risks such as cardiac or respiratory or neurological events, and allergic reactions. Patient understands.)        Anesthesia Quick Evaluation

## 2020-12-12 NOTE — Progress Notes (Signed)
Patients blood sugar Is 84 , Dr Andree Elk aware and states patient is ok to go home

## 2020-12-13 ENCOUNTER — Encounter: Payer: Self-pay | Admitting: Orthopedic Surgery

## 2020-12-16 LAB — SURGICAL PATHOLOGY

## 2020-12-23 ENCOUNTER — Other Ambulatory Visit: Payer: Self-pay

## 2020-12-23 ENCOUNTER — Encounter: Payer: Self-pay | Admitting: Podiatry

## 2020-12-23 ENCOUNTER — Ambulatory Visit (INDEPENDENT_AMBULATORY_CARE_PROVIDER_SITE_OTHER): Payer: Medicare Other | Admitting: Podiatry

## 2020-12-23 DIAGNOSIS — E1159 Type 2 diabetes mellitus with other circulatory complications: Secondary | ICD-10-CM | POA: Diagnosis not present

## 2020-12-23 DIAGNOSIS — M79675 Pain in left toe(s): Secondary | ICD-10-CM

## 2020-12-23 DIAGNOSIS — B351 Tinea unguium: Secondary | ICD-10-CM | POA: Diagnosis not present

## 2020-12-23 DIAGNOSIS — M79674 Pain in right toe(s): Secondary | ICD-10-CM | POA: Diagnosis not present

## 2020-12-23 NOTE — Progress Notes (Signed)
This patient returns to my office for at risk foot care.  This patient requires this care by a professional since this patient will be at risk due to having diabetes and PVD.   John Reilly  This patient is unable to cut nails himself since the patient cannot reach his nails.These nails are painful walking and wearing shoes.    This patient presents for at risk foot care today.  General Appearance  Alert, conversant and in no acute stress.  Vascular  Dorsalis pedis  are  weakly  palpable  bilaterally. Posterior tibial pulses are absent  B/L. Capillary return is within normal limits  bilaterally. Temperature is within normal limits  Bilaterally. Venous stasis legs  B/L.    Neurologic  Senn-Weinstein monofilament wire test within normal limits  bilaterally. Muscle power within normal limits bilaterally.  Nails Thick disfigured discolored nails with subungual debris  from hallux to fifth toes bilaterally. No evidence of bacterial infection or drainage bilaterally.  Orthopedic  No limitations of motion  feet .  No crepitus or effusions noted. 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  Hammer toes 2,3 right foot.  Skin  normotropic skin with no porokeratosis noted bilaterally.  No signs of infections or ulcers noted.   Draining lesion left lower leg due to venous stasis.  Onychomycosis  Pain in right toes  Pain in left toes  Consent was obtained for treatment procedures.   Mechanical debridement of nails 1-5  bilaterally performed with a nail nipper.  Filed with dremel without incident.  Draining skin lesion bandaged with silvadene/DSD.   Return office visit     3 months                 Told patient to return for periodic foot care and evaluation due to potential at risk complications.   Gardiner Barefoot DPM

## 2021-01-02 ENCOUNTER — Ambulatory Visit: Payer: Medicare Other | Admitting: Podiatry

## 2021-01-07 ENCOUNTER — Ambulatory Visit (INDEPENDENT_AMBULATORY_CARE_PROVIDER_SITE_OTHER): Payer: Medicare Other | Admitting: Nurse Practitioner

## 2021-01-08 ENCOUNTER — Ambulatory Visit (INDEPENDENT_AMBULATORY_CARE_PROVIDER_SITE_OTHER): Payer: Medicare Other

## 2021-01-08 ENCOUNTER — Other Ambulatory Visit: Payer: Self-pay

## 2021-01-08 DIAGNOSIS — M2041 Other hammer toe(s) (acquired), right foot: Secondary | ICD-10-CM

## 2021-01-08 DIAGNOSIS — M2042 Other hammer toe(s) (acquired), left foot: Secondary | ICD-10-CM

## 2021-01-08 DIAGNOSIS — E1159 Type 2 diabetes mellitus with other circulatory complications: Secondary | ICD-10-CM

## 2021-01-09 NOTE — Progress Notes (Signed)
Patient presents to the office today for diabetic shoe and insole measuring.  Patient was measured with brannock device to determine size and width for 1 pair of extra depth shoes and foam casted for 3 pair of insoles.   Documentation of medical necessity will be sent to patient's treating diabetic doctor to verify and sign.   Patient's diabetic provider: Dr. Janann Colonel and insoles will be ordered at that time and patient will be notified for an appointment for fitting when they arrive.   Shoe size (per patient): 8.5   Brannock measurement: 8.5 wide  Patient shoe selection-   1st choice:   [id number]  2nd choice:    Shoe size ordered: 9 wide

## 2021-01-16 ENCOUNTER — Encounter (INDEPENDENT_AMBULATORY_CARE_PROVIDER_SITE_OTHER): Payer: Self-pay | Admitting: Nurse Practitioner

## 2021-01-16 ENCOUNTER — Ambulatory Visit (INDEPENDENT_AMBULATORY_CARE_PROVIDER_SITE_OTHER): Payer: Medicare Other | Admitting: Nurse Practitioner

## 2021-01-16 ENCOUNTER — Other Ambulatory Visit: Payer: Self-pay

## 2021-01-16 VITALS — BP 100/67 | HR 88 | Resp 16 | Wt 129.4 lb

## 2021-01-16 DIAGNOSIS — I890 Lymphedema, not elsewhere classified: Secondary | ICD-10-CM

## 2021-01-16 DIAGNOSIS — E1159 Type 2 diabetes mellitus with other circulatory complications: Secondary | ICD-10-CM | POA: Diagnosis not present

## 2021-01-16 NOTE — Progress Notes (Signed)
Subjective:    Patient ID: John Reilly, male    DOB: Jan 31, 1956, 65 y.o.   MRN: [id number] Chief Complaint  Patient presents with   Follow-up    6 month follow up    A Vedros is a 65 year old male that returns to the office for followup evaluation regarding leg swelling.  The swelling has improved quite a bit and the pain associated with swelling has decreased substantially. There have not been any interval development of a ulcerations or wounds.  Since the previous visit the patient has been wearing graduated compression stockings and has noted little significant improvement in the lymphedema. The patient has been using compression routinely morning until night.  The patient is mostly sedentary due to weakness.  He denies any rest pain or claudication-like symptoms.       Review of Systems  Cardiovascular:  Positive for leg swelling.  Gastrointestinal:  Positive for diarrhea.  Neurological:  Negative for weakness.  All other systems reviewed and are negative.     Objective:   Physical Exam Vitals reviewed.  HENT:     Head: Normocephalic.  Cardiovascular:     Rate and Rhythm: Normal rate.     Pulses:          Dorsalis pedis pulses are 0 on the right side and 0 on the left side.       Posterior tibial pulses are 1+ on the right side and 1+ on the left side.  Pulmonary:     Effort: Pulmonary effort is normal.  Musculoskeletal:     Right lower leg: 2+ Pitting Edema present.     Left lower leg: 2+ Pitting Edema present.     Comments: Edema around ankles only  Neurological:     Mental Status: He is alert and oriented to person, place, and time.     Motor: Weakness present.     Gait: Gait abnormal.  Psychiatric:        Mood and Affect: Mood normal.        Behavior: Behavior normal.        Thought Content: Thought content normal.        Judgment: Judgment normal.    BP 100/67 (BP Location: Right Arm)   Pulse 88   Resp 16   Wt 129 lb 6.4 oz (58.7 kg)   BMI 18.57  kg/m   Past Medical History:  Diagnosis Date   Cancer ([place])    Pt reports on 10/02/17 that he has never been dx with cancer   Colon polyps    COPD (chronic obstructive pulmonary disease) (HCC)    Diabetes mellitus without complication (HCC)    type 1   GERD (gastroesophageal reflux disease)    Hip fracture (HCC)    History of malignant neoplasm of colon    Hypertension    Multiple duodenal ulcers    Parkinson disease (HCC)    Peripheral vascular disease (HCC)    Peripheral Neuropathy   Retinopathy    T12 compression fracture ([place]) 11/2020    Social History   Socioeconomic History   Marital status: Married    Spouse name: John Reilly   Number of children: Not on file   Years of education: Not on file   Highest education level: Not on file  Occupational History   Not on file  Tobacco Use   Smoking status: Former    Types: Cigarettes    Quit date: 10/21/2005    Years since quitting: 2.2  Smokeless tobacco: Never  Vaping Use   Vaping Use: Never used  Substance and Sexual Activity   Alcohol use: Yes    Alcohol/week: 21.0 standard drinks    Types: 21 Cans of beer per week    Comment: 1 40ounce beer a day   Drug use: Not Currently    Types: Marijuana   Sexual activity: Not on file  Other Topics Concern   Not on file  Social History Narrative   Not on file   Social Determinants of Health   Financial Resource Strain: Not on file  Food Insecurity: Not on file  Transportation Needs: Not on file  Physical Activity: Not on file  Stress: Not on file  Social Connections: Not on file  Intimate Partner Violence: Not on file    Past Surgical History:  Procedure Laterality Date   ANKLE ARTHROPLASTY Right 12/2011   [place], 1999, 2002, 2006, 2009, 2013, 2018   COLONOSCOPY WITH PROPOFOL N/A 10/12/2016   Procedure: COLONOSCOPY WITH PROPOFOL;  Surgeon: Manya Silvas, MD;  [facility];  Service: Endoscopy;  Laterality: N/A;    ESOPHAGOGASTRODUODENOSCOPY  07/09/2011   FINGER SURGERY Right 09/2007   thumb repair due to crushing injury; metal hardware   [place] (IM) NAIL INTERTROCHANTERIC Left 08/08/2018   Procedure: INTRAMEDULLARY (IM) NAIL INTERTROCHANTRIC;  Surgeon: Corky Mull, MD;  Location: [facility] ORS;  Service: Orthopedics;  Laterality: Left;   KYPHOPLASTY N/A 12/12/2020   Procedure: T 12 KYPHOPLASTY;  Surgeon: Hessie Knows, MD;  Location: [facility] ORS;  Service: Orthopedics;  Laterality: N/A;    Family History  Problem Relation Age of Onset   Stroke Mother    Alzheimer's disease Father    Diabetes Brother     Allergies  Allergen Reactions   Penicillins Rash and Other (See Comments)    Has patient had a PCN reaction causing immediate rash, facial/tongue/throat swelling, SOB or lightheadedness with hypotension: Yes Has patient had a PCN reaction causing severe rash involving mucus membranes or skin necrosis: No Has patient had a PCN reaction that required hospitalization: No Has patient had a PCN reaction occurring within the last 10 years: No If all of the above answers are "NO", then may proceed with Cephalosporin use. Other reaction(s): RASH Has patient had a PCN reaction causing immediate rash, facial/tongue/throat swelling, SOB or lightheadedness with hypotension: Yes Has patient had a PCN reaction causing severe rash involving mucus membranes or skin necrosis: No Has patient had a PCN reaction that required hospitalization: No Has patient had a PCN reaction occurring within the last 10 years: No If all of the above answers are "NO", then may proceed with Cephalosporin use. Reaction:  Unknown; childhood reaction  Has patient had a PCN reaction causing immediate rash, facial/tongue/throat swelling, SOB or lightheadedness with hypotension: Yes Has patient had a PCN reaction causing severe rash involving mucus membranes or skin necrosis: No Has patient had a PCN  reaction that required hospitalization: No Has patient had a PCN reaction occurring within the last 10 years: No If all of the above answers are "NO", then may proceed with Cephalosporin use.   Sulfa Antibiotics Rash    Katherina Right Syndrome (SJS)     CBC Latest Ref Rng & Units 11/28/2020 03/24/2020 03/23/2020  WBC 4.0 - 10.5 K/uL 4.7 7.7 13.7(H)  Hemoglobin 13.0 - 17.0 g/dL 11.0(L) 9.7(L) 9.5(L)  Hematocrit 39.0 - 52.0 % 32.6(L) 28.8(L) 28.1(L)  Platelets 150 -  400 K/uL 290 282 282      CMP     Component Value Date/Time   NA 136 11/28/2020 1706   K 4.5 11/28/2020 1706   CL 104 11/28/2020 1706   CO2 23 11/28/2020 1706   GLUCOSE 53 (L) 11/28/2020 1706   BUN 22 11/28/2020 1706   CREATININE 1.35 (H) 11/28/2020 1706   CALCIUM 8.2 (L) 11/28/2020 1706   PROT 5.2 (L) 11/28/2020 1706   ALBUMIN 2.3 (L) 11/28/2020 1706   AST 35 11/28/2020 1706   ALT 10 11/28/2020 1706   ALKPHOS 103 11/28/2020 1706   BILITOT 1.1 11/28/2020 1706   GFRNONAA 58 (L) 11/28/2020 1706   GFRAA >60 08/15/2018 0407     No results found.     Assessment & Plan:   1. Lymphedema  No surgery or intervention at this point in time.    I have reviewed my previous discussion with the patient regarding swelling and why it  causes symptoms.  The patient is doing well with compression and will continue wearing graduated compression stockings class 1 (20-30 mmHg) on a daily basis a prescription was given. The patient will  continue wearing the stockings first thing in the morning and removing them in the evening. The patient is instructed specifically not to sleep in the stockings.    In addition, behavioral modification including elevation during the day and exercise will be continued.    Patient should follow-up on an annual basis    2. Type 2 diabetes mellitus with vascular disease ([place]) Continue hypoglycemic medications as already ordered, these medications have been reviewed and there are no changes at this  time.  Hgb A1C to be monitored as already arranged by primary service    Current Outpatient Medications on File Prior to Visit  Medication Sig Dispense Refill   acetaminophen (TYLENOL) 500 MG tablet Take 1,000 mg by mouth every 6 (six) hours as needed for moderate pain or mild pain.     carbidopa-levodopa (SINEMET CR) 50-200 MG tablet Take 1 tablet by mouth at bedtime.      carbidopa-levodopa (SINEMET IR) 25-250 MG tablet Take 1 tablet by mouth 4 (four) times daily.      fludrocortisone (FLORINEF) 0.1 MG tablet Take 200 mcg by mouth daily.     folic acid (FOLVITE) Q000111Q MCG tablet Take 800 mcg by mouth daily.      Insulin Infusion Pump Supplies (PARADIGM PUMP RESERVOIR 1.8ML) MISC 1 Device as directed.      insulin lispro (HUMALOG) 100 UNIT/ML injection 22-55 Units daily. In Pump     meloxicam (MOBIC) 7.5 MG tablet Take 7.5 mg by mouth at bedtime.     metoprolol succinate (TOPROL-XL) 50 MG 24 hr tablet Take 25 mg by mouth daily.     midodrine (PROAMATINE) 2.5 MG tablet Take 2.5 mg by mouth 2 (two) times daily.     Multiple Vitamin (MULTIVITAMIN WITH MINERALS) TABS tablet Take 1 tablet by mouth daily.     omeprazole (PRILOSEC) 40 MG capsule Take 40 mg by mouth at bedtime.     Pancrelipase, Lip-Prot-Amyl, (CREON PO) Take by mouth 3 (three) times daily.     potassium chloride SA (KLOR-CON) 20 MEQ tablet Take 20 mEq by mouth daily.     HYDROcodone-acetaminophen (NORCO) 5-325 MG tablet Take 1 tablet by mouth every 6 (six) hours as needed. (Patient not taking: Reported on 01/16/2021) 15 tablet 0   No current facility-administered medications on file prior to visit.    There  are no Patient Instructions on file for this visit. No follow-ups on file.   Kris Hartmann, NP

## 2021-01-20 ENCOUNTER — Emergency Department: Payer: Medicare Other

## 2021-01-20 ENCOUNTER — Inpatient Hospital Stay
Admission: EM | Admit: 2021-01-20 | Discharge: 2021-01-28 | DRG: 871 | Disposition: A | Discharge status: Skilled Nursing Facility | Payer: Medicare Other | Attending: Student | Admitting: Student

## 2021-01-20 ENCOUNTER — Encounter: Payer: Self-pay | Admitting: Emergency Medicine

## 2021-01-20 ENCOUNTER — Other Ambulatory Visit: Payer: Self-pay

## 2021-01-20 DIAGNOSIS — K8689 Other specified diseases of pancreas: Secondary | ICD-10-CM | POA: Diagnosis present

## 2021-01-20 DIAGNOSIS — A419 Sepsis, unspecified organism: Secondary | ICD-10-CM | POA: Diagnosis present

## 2021-01-20 DIAGNOSIS — Z882 Allergy status to sulfonamides status: Secondary | ICD-10-CM

## 2021-01-20 DIAGNOSIS — R680 Hypothermia, not associated with low environmental temperature: Secondary | ICD-10-CM | POA: Diagnosis present

## 2021-01-20 DIAGNOSIS — J690 Pneumonitis due to inhalation of food and vomit: Secondary | ICD-10-CM | POA: Diagnosis present

## 2021-01-20 DIAGNOSIS — M549 Dorsalgia, unspecified: Secondary | ICD-10-CM | POA: Diagnosis present

## 2021-01-20 DIAGNOSIS — Z79899 Other long term (current) drug therapy: Secondary | ICD-10-CM

## 2021-01-20 DIAGNOSIS — Z833 Family history of diabetes mellitus: Secondary | ICD-10-CM

## 2021-01-20 DIAGNOSIS — G9341 Metabolic encephalopathy: Secondary | ICD-10-CM | POA: Diagnosis present

## 2021-01-20 DIAGNOSIS — Z9641 Presence of insulin pump (external) (internal): Secondary | ICD-10-CM | POA: Diagnosis present

## 2021-01-20 DIAGNOSIS — I10 Essential (primary) hypertension: Secondary | ICD-10-CM | POA: Diagnosis present

## 2021-01-20 DIAGNOSIS — E871 Hypo-osmolality and hyponatremia: Secondary | ICD-10-CM | POA: Diagnosis not present

## 2021-01-20 DIAGNOSIS — E109 Type 1 diabetes mellitus without complications: Secondary | ICD-10-CM | POA: Diagnosis present

## 2021-01-20 DIAGNOSIS — I951 Orthostatic hypotension: Secondary | ICD-10-CM | POA: Diagnosis present

## 2021-01-20 DIAGNOSIS — E1042 Type 1 diabetes mellitus with diabetic polyneuropathy: Secondary | ICD-10-CM | POA: Diagnosis present

## 2021-01-20 DIAGNOSIS — Z794 Long term (current) use of insulin: Secondary | ICD-10-CM | POA: Diagnosis not present

## 2021-01-20 DIAGNOSIS — E10649 Type 1 diabetes mellitus with hypoglycemia without coma: Secondary | ICD-10-CM | POA: Diagnosis present

## 2021-01-20 DIAGNOSIS — K219 Gastro-esophageal reflux disease without esophagitis: Secondary | ICD-10-CM | POA: Diagnosis present

## 2021-01-20 DIAGNOSIS — F1010 Alcohol abuse, uncomplicated: Secondary | ICD-10-CM | POA: Diagnosis present

## 2021-01-20 DIAGNOSIS — Z8711 Personal history of peptic ulcer disease: Secondary | ICD-10-CM

## 2021-01-20 DIAGNOSIS — E10319 Type 1 diabetes mellitus with unspecified diabetic retinopathy without macular edema: Secondary | ICD-10-CM | POA: Diagnosis present

## 2021-01-20 DIAGNOSIS — G8929 Other chronic pain: Secondary | ICD-10-CM | POA: Diagnosis present

## 2021-01-20 DIAGNOSIS — E1051 Type 1 diabetes mellitus with diabetic peripheral angiopathy without gangrene: Secondary | ICD-10-CM | POA: Diagnosis present

## 2021-01-20 DIAGNOSIS — G20 Parkinson's disease: Secondary | ICD-10-CM | POA: Diagnosis present

## 2021-01-20 DIAGNOSIS — Z791 Long term (current) use of non-steroidal anti-inflammatories (NSAID): Secondary | ICD-10-CM

## 2021-01-20 DIAGNOSIS — E43 Unspecified severe protein-calorie malnutrition: Secondary | ICD-10-CM | POA: Insufficient documentation

## 2021-01-20 DIAGNOSIS — G20A1 Parkinson's disease without dyskinesia, without mention of fluctuations: Secondary | ICD-10-CM | POA: Diagnosis present

## 2021-01-20 DIAGNOSIS — Z681 Body mass index (BMI) 19 or less, adult: Secondary | ICD-10-CM | POA: Diagnosis not present

## 2021-01-20 DIAGNOSIS — E160 Drug-induced hypoglycemia without coma: Secondary | ICD-10-CM | POA: Diagnosis not present

## 2021-01-20 DIAGNOSIS — E162 Hypoglycemia, unspecified: Secondary | ICD-10-CM | POA: Diagnosis present

## 2021-01-20 DIAGNOSIS — T68XXXA Hypothermia, initial encounter: Secondary | ICD-10-CM | POA: Diagnosis not present

## 2021-01-20 DIAGNOSIS — Z20822 Contact with and (suspected) exposure to covid-19: Secondary | ICD-10-CM | POA: Diagnosis present

## 2021-01-20 DIAGNOSIS — Z87891 Personal history of nicotine dependence: Secondary | ICD-10-CM

## 2021-01-20 DIAGNOSIS — Z85038 Personal history of other malignant neoplasm of large intestine: Secondary | ICD-10-CM

## 2021-01-20 DIAGNOSIS — Z880 Allergy status to penicillin: Secondary | ICD-10-CM

## 2021-01-20 DIAGNOSIS — R6520 Severe sepsis without septic shock: Secondary | ICD-10-CM | POA: Diagnosis present

## 2021-01-20 DIAGNOSIS — J449 Chronic obstructive pulmonary disease, unspecified: Secondary | ICD-10-CM | POA: Diagnosis present

## 2021-01-20 LAB — COMPREHENSIVE METABOLIC PANEL
ALT: 15 U/L (ref 0–44)
AST: 36 U/L (ref 15–41)
Albumin: 2.2 g/dL — ABNORMAL LOW (ref 3.5–5.0)
Alkaline Phosphatase: 183 U/L — ABNORMAL HIGH (ref 38–126)
Anion gap: 7 (ref 5–15)
BUN: 11 mg/dL (ref 8–23)
CO2: 23 mmol/L (ref 22–32)
Calcium: 7.6 mg/dL — ABNORMAL LOW (ref 8.9–10.3)
Chloride: 100 mmol/L (ref 98–111)
Creatinine, Ser: 0.83 mg/dL (ref 0.61–1.24)
GFR, Estimated: >60 mL/min (ref >60)
Glucose, Bld: 167 mg/dL — ABNORMAL HIGH (ref 70–99)
Potassium: 3.9 mmol/L (ref 3.5–5.1)
Sodium: 130 mmol/L — ABNORMAL LOW (ref 135–145)
Total Bilirubin: 0.7 mg/dL (ref 0.3–1.2)
Total Protein: 5.5 g/dL — ABNORMAL LOW (ref 6.5–8.1)

## 2021-01-20 LAB — CBC WITH DIFFERENTIAL/PLATELET
Abs Immature Granulocytes: 0.05 10*3/uL (ref 0.00–0.07)
Basophils Absolute: 0 10*3/uL (ref 0.0–0.1)
Basophils Relative: 0 %
Eosinophils Absolute: 0 10*3/uL (ref 0.0–0.5)
Eosinophils Relative: 0 %
HCT: 33.5 % — ABNORMAL LOW (ref 39.0–52.0)
Hemoglobin: 11.5 g/dL — ABNORMAL LOW (ref 13.0–17.0)
Immature Granulocytes: 1 %
Lymphocytes Relative: 15 %
Lymphs Abs: 1 10*3/uL (ref 0.7–4.0)
MCH: 33.2 pg (ref 26.0–34.0)
MCHC: 34.3 g/dL (ref 30.0–36.0)
MCV: 96.8 fL (ref 80.0–100.0)
Monocytes Absolute: 0.3 10*3/uL (ref 0.1–1.0)
Monocytes Relative: 5 %
Neutro Abs: 5.1 10*3/uL (ref 1.7–7.7)
Neutrophils Relative %: 79 %
Platelets: 482 10*3/uL — ABNORMAL HIGH (ref 150–400)
RBC: 3.46 MIL/uL — ABNORMAL LOW (ref 4.22–5.81)
RDW: 14.6 % (ref 11.5–15.5)
WBC: 6.5 10*3/uL (ref 4.0–10.5)
nRBC: 0 % (ref 0.0–0.2)

## 2021-01-20 LAB — CK: Total CK: 19 U/L — ABNORMAL LOW (ref 49–397)

## 2021-01-20 LAB — CBG MONITORING, ED
Glucose-Capillary: 84 mg/dL (ref 70–99)
Glucose-Capillary: 92 mg/dL (ref 70–99)
Glucose-Capillary: 97 mg/dL (ref 70–99)
Glucose-Capillary: 45 mg/dL — ABNORMAL LOW (ref 70–99)
Glucose-Capillary: 64 mg/dL — ABNORMAL LOW (ref 70–99)
Glucose-Capillary: 50 mg/dL — ABNORMAL LOW (ref 70–99)
Glucose-Capillary: 55 mg/dL — ABNORMAL LOW (ref 70–99)
Glucose-Capillary: 71 mg/dL (ref 70–99)
Glucose-Capillary: 81 mg/dL (ref 70–99)
Glucose-Capillary: 99 mg/dL (ref 70–99)

## 2021-01-20 LAB — LACTIC ACID, PLASMA
Lactic Acid, Venous: 1.5 mmol/L (ref 0.5–1.9)
Lactic Acid, Venous: 1.6 mmol/L (ref 0.5–1.9)

## 2021-01-20 LAB — TROPONIN I (HIGH SENSITIVITY)
Troponin I (High Sensitivity): 8 ng/L (ref <18)
Troponin I (High Sensitivity): 7 ng/L (ref <18)

## 2021-01-20 LAB — GLUCOSE, CAPILLARY
Glucose-Capillary: 115 mg/dL — ABNORMAL HIGH (ref 70–99)
Glucose-Capillary: 214 mg/dL — ABNORMAL HIGH (ref 70–99)

## 2021-01-20 LAB — RESP PANEL BY RT-PCR (FLU A&B, COVID) ARPGX2
Influenza A by PCR: NEGATIVE
Influenza B by PCR: NEGATIVE
SARS Coronavirus 2 by RT PCR: NEGATIVE

## 2021-01-20 LAB — PROCALCITONIN: Procalcitonin: 0.11 ng/mL

## 2021-01-20 LAB — BRAIN NATRIURETIC PEPTIDE: B Natriuretic Peptide: 255.1 pg/mL — ABNORMAL HIGH (ref 0.0–100.0)

## 2021-01-20 MED ORDER — INSULIN GLARGINE-YFGN 100 UNIT/ML ~~LOC~~ SOLN
4.0000 [IU] | Freq: Every day | SUBCUTANEOUS | Status: DC
  Administered 2021-01-20 – 2021-01-27 (×8): 4 [IU] via SUBCUTANEOUS
  Filled 2021-01-20 (×8): qty 0.04

## 2021-01-20 MED ORDER — ALBUTEROL SULFATE (2.5 MG/3ML) 0.083% IN NEBU: 3.0000 mL | INHALATION_SOLUTION | RESPIRATORY_TRACT | Status: DC | PRN

## 2021-01-20 MED ORDER — IOHEXOL 350 MG/ML SOLN
75.0000 mL | Freq: Once | INTRAVENOUS | Status: AC | PRN
  Administered 2021-01-20: 75 mL via INTRAVENOUS

## 2021-01-20 MED ORDER — THIAMINE HCL 100 MG/ML IJ SOLN: 100.0000 mg | Freq: Every day | INTRAMUSCULAR | Status: DC

## 2021-01-20 MED ORDER — CLINDAMYCIN PHOSPHATE 600 MG/50ML IV SOLN
600.0000 mg | Freq: Once | INTRAVENOUS | Status: DC
  Filled 2021-01-20: qty 50

## 2021-01-20 MED ORDER — DM-GUAIFENESIN ER 30-600 MG PO TB12: 1.0000 | ORAL_TABLET | Freq: Two times a day (BID) | ORAL | Status: DC | PRN

## 2021-01-20 MED ORDER — PANCRELIPASE (LIP-PROT-AMYL) 12000-38000 UNITS PO CPEP
24000.0000 [IU] | ORAL_CAPSULE | Freq: Three times a day (TID) | ORAL | Status: DC
  Administered 2021-01-21 – 2021-01-28 (×23): 24000 [IU] via ORAL
  Filled 2021-01-20 (×25): qty 2

## 2021-01-20 MED ORDER — MIDODRINE HCL 5 MG PO TABS
2.5000 mg | ORAL_TABLET | Freq: Two times a day (BID) | ORAL | Status: DC
  Administered 2021-01-20 – 2021-01-24 (×8): 2.5 mg via ORAL
  Filled 2021-01-20 (×8): qty 1

## 2021-01-20 MED ORDER — SODIUM CHLORIDE 0.9 % IV SOLN
500.0000 mg | INTRAVENOUS | Status: DC
  Filled 2021-01-20 (×2): qty 500

## 2021-01-20 MED ORDER — SODIUM CHLORIDE 0.9% FLUSH
3.0000 mL | Freq: Two times a day (BID) | INTRAVENOUS | Status: DC
  Administered 2021-01-20 – 2021-01-28 (×14): 3 mL via INTRAVENOUS

## 2021-01-20 MED ORDER — ONDANSETRON HCL 4 MG/2ML IJ SOLN: 4.0000 mg | Freq: Three times a day (TID) | INTRAMUSCULAR | Status: DC | PRN

## 2021-01-20 MED ORDER — INSULIN ASPART 100 UNIT/ML IJ SOLN
0.0000 [IU] | INTRAMUSCULAR | Status: DC
  Administered 2021-01-20: 2 [IU] via SUBCUTANEOUS
  Administered 2021-01-20: 1 [IU] via SUBCUTANEOUS
  Administered 2021-01-22: 3 [IU] via SUBCUTANEOUS
  Administered 2021-01-22 (×2): 1 [IU] via SUBCUTANEOUS
  Administered 2021-01-23: 2 [IU] via SUBCUTANEOUS
  Administered 2021-01-23: 1 [IU] via SUBCUTANEOUS
  Administered 2021-01-24: 3 [IU] via SUBCUTANEOUS
  Administered 2021-01-24: 1 [IU] via SUBCUTANEOUS
  Administered 2021-01-24: 2 [IU] via SUBCUTANEOUS
  Administered 2021-01-25: 1 [IU] via SUBCUTANEOUS
  Administered 2021-01-25: 3 [IU] via SUBCUTANEOUS
  Filled 2021-01-20 (×12): qty 1

## 2021-01-20 MED ORDER — DEXTROSE 50 % IV SOLN
50.0000 mL | INTRAVENOUS | Status: DC | PRN
  Administered 2021-01-21: 50 mL via INTRAVENOUS
  Filled 2021-01-20 (×2): qty 50

## 2021-01-20 MED ORDER — METRONIDAZOLE 500 MG/100ML IV SOLN
500.0000 mg | Freq: Three times a day (TID) | INTRAVENOUS | Status: AC
Start: 2021-01-20 — End: 2021-01-24
  Administered 2021-01-20 – 2021-01-24 (×13): 500 mg via INTRAVENOUS
  Filled 2021-01-20 (×13): qty 100

## 2021-01-20 MED ORDER — THIAMINE HCL 100 MG PO TABS
100.0000 mg | ORAL_TABLET | Freq: Every day | ORAL | Status: DC
  Administered 2021-01-20 – 2021-01-28 (×9): 100 mg via ORAL
  Filled 2021-01-20 (×9): qty 1

## 2021-01-20 MED ORDER — CARBIDOPA-LEVODOPA ER 50-200 MG PO TBCR
1.0000 | EXTENDED_RELEASE_TABLET | Freq: Every day | ORAL | Status: DC
  Administered 2021-01-20 – 2021-01-27 (×8): 1 via ORAL
  Filled 2021-01-20 (×11): qty 1

## 2021-01-20 MED ORDER — ADULT MULTIVITAMIN W/MINERALS CH
1.0000 | ORAL_TABLET | Freq: Every day | ORAL | Status: DC
  Administered 2021-01-20 – 2021-01-28 (×9): 1 via ORAL
  Filled 2021-01-20 (×9): qty 1

## 2021-01-20 MED ORDER — ENOXAPARIN SODIUM 40 MG/0.4ML IJ SOSY
40.0000 mg | PREFILLED_SYRINGE | INTRAMUSCULAR | Status: DC
  Administered 2021-01-20 – 2021-01-27 (×8): 40 mg via SUBCUTANEOUS
  Filled 2021-01-20 (×8): qty 0.4

## 2021-01-20 MED ORDER — ACETAMINOPHEN 325 MG PO TABS: 650.0000 mg | ORAL_TABLET | Freq: Four times a day (QID) | ORAL | Status: DC | PRN

## 2021-01-20 MED ORDER — SODIUM CHLORIDE 0.9 % IV SOLN
1.0000 g | INTRAVENOUS | Status: AC
  Administered 2021-01-21 – 2021-01-24 (×4): 1 g via INTRAVENOUS
  Filled 2021-01-20: qty 1
  Filled 2021-01-20: qty 10
  Filled 2021-01-20: qty 1
  Filled 2021-01-20: qty 10

## 2021-01-20 MED ORDER — RIVASTIGMINE TARTRATE 1.5 MG PO CAPS
1.5000 mg | ORAL_CAPSULE | Freq: Two times a day (BID) | ORAL | Status: DC
  Administered 2021-01-20 – 2021-01-28 (×16): 1.5 mg via ORAL
  Filled 2021-01-20 (×18): qty 1

## 2021-01-20 MED ORDER — FLUDROCORTISONE ACETATE 0.1 MG PO TABS
200.0000 ug | ORAL_TABLET | Freq: Every day | ORAL | Status: DC
  Administered 2021-01-20 – 2021-01-28 (×9): 200 ug via ORAL
  Filled 2021-01-20 (×11): qty 2

## 2021-01-20 MED ORDER — LORAZEPAM 2 MG/ML IJ SOLN: 0.0000 mg | Freq: Two times a day (BID) | INTRAMUSCULAR | Status: DC

## 2021-01-20 MED ORDER — LORAZEPAM 2 MG/ML IJ SOLN: 0.0000 mg | Freq: Four times a day (QID) | INTRAMUSCULAR | Status: DC

## 2021-01-20 MED ORDER — LORAZEPAM 1 MG PO TABS
1.0000 mg | ORAL_TABLET | ORAL | Status: AC | PRN
  Administered 2021-01-21: 1 mg via ORAL
  Filled 2021-01-20: qty 1

## 2021-01-20 MED ORDER — FOLIC ACID 1 MG PO TABS
1.0000 mg | ORAL_TABLET | Freq: Every day | ORAL | Status: DC
  Administered 2021-01-20 – 2021-01-28 (×9): 1 mg via ORAL
  Filled 2021-01-20 (×9): qty 1

## 2021-01-20 MED ORDER — DEXTROSE 50 % IV SOLN
25.0000 mL | Freq: Once | INTRAVENOUS | Status: AC
  Administered 2021-01-20: 25 mL via INTRAVENOUS

## 2021-01-20 MED ORDER — SODIUM CHLORIDE 0.9 % IV BOLUS
1000.0000 mL | Freq: Once | INTRAVENOUS | Status: AC
  Administered 2021-01-20: 1000 mL via INTRAVENOUS

## 2021-01-20 MED ORDER — SODIUM CHLORIDE 0.9 % IV SOLN
1.0000 g | Freq: Once | INTRAVENOUS | Status: AC
  Administered 2021-01-20: 1 g via INTRAVENOUS
  Filled 2021-01-20: qty 10

## 2021-01-20 MED ORDER — LORAZEPAM 2 MG/ML IJ SOLN: 1.0000 mg | INTRAMUSCULAR | Status: DC | PRN

## 2021-01-20 MED ORDER — DEXTROSE-NACL 5-0.9 % IV SOLN: INTRAVENOUS | Status: DC

## 2021-01-20 MED ORDER — CARBIDOPA-LEVODOPA 25-250 MG PO TABS
1.0000 | ORAL_TABLET | Freq: Four times a day (QID) | ORAL | Status: DC
  Administered 2021-01-21 – 2021-01-28 (×31): 1 via ORAL
  Filled 2021-01-20 (×36): qty 1

## 2021-01-20 MED ORDER — PANTOPRAZOLE SODIUM 40 MG PO TBEC
40.0000 mg | DELAYED_RELEASE_TABLET | Freq: Every day | ORAL | Status: DC
Start: 2021-01-20 — End: 2021-01-28
  Administered 2021-01-20 – 2021-01-28 (×9): 40 mg via ORAL
  Filled 2021-01-20 (×9): qty 1

## 2021-01-20 NOTE — Progress Notes (Addendum)
Inpatient Diabetes Program Recommendations  AACE/ADA: New Consensus Statement on Inpatient Glycemic Control (2015)  Target Ranges:  Prepandial:   less than 140 mg/dL      Peak postprandial:   less than 180 mg/dL (1-2 hours)      Critically ill patients:  140 - 180 mg/dL   Lab Results  Component Value Date   GLUCAP 84 01/20/2021   HGBA1C 6.6 (H) 03/21/2020    Review of Glycemic Control  Diabetes history: Type 1 DM Insulin pump: Manual mode basal rates Midnight = 0.35 3 AM = 0.275  6 AM = 0.275 2 PM = 0.35 (reduced to 0.3 units/ hr) 4 PM = 0.55 (reduced to 0.5 units/hr) 6:30 PM = 0.45 (reduced to 0.4 units/hr) TDD basal: 8.125 units  Per Visit with Dr. Ladell Pier on 11/08/20: I will cut his MN basal rate to 0.325, his 2PM rate to 0.3, his 4PM rate to 0.5, and his 6:30PM rate to 0.4. I will also increase his sensitivity to 85.   Bolus settings I/C: 25 ISF: 85 Target Glucose: 120 Active insulin time: 5 hours Current orders for Inpatient glycemic control:  Insulin pump/uses Dexcom sensor  Inpatient Diabetes Program Recommendations:    Called and spoke with patient's wife.  She states that patient's appetite has been very reduced over the past 3-4 months.  He often complains that he is not hungry and intake is reduced.  We discussed low blood sugar and she wonders if it is b/c he did not eat very much last night?  He also changed out his Dexcom sensor yesterday.  Spoke to [facility] at Dr. Phylliss Blakes' office as well who states that patient has "bolused to frequently" in the past and this could be the problem.  They do have a 2:30 p appointment available if he is d/c'd from the ED for Dr. Ladell Pier to evaluate his insulin pump settings.   Thanks,  Adah Perl, RN, BC-ADM Inpatient Diabetes Coordinator Pager [phone] (8a-5p)   Addendum:  Assessed T-Slim insulin pump (disconnected from patient).  It appears that patient has not changed his Dexcom sensor and it is expired.  The sensor  allows the insulin pump reduce dose when blood sugar is dropping to prevent lows.  It also alarms so that the patient knows that blood sugar is dropping.  This function was not working when patient had low blood sugar.  Insulin pump currently disconnected from patient for 30 minutes.  Called patient's wife and asked her to bring all insulin pump supplies and sensor supplies so that pump site and sensor can be restarted.  Patient is going to CT scan so will want to remove old sensor prior to CT.  Patient will likely be admitted.  Called and reported to Dr. Phylliss Blakes office.  I did interrogate the insulin pump and it did not appear that he over bolused on 9/11 or 9/12? Will follow.

## 2021-01-20 NOTE — H&P (Signed)
History and Physical    John Reilly [id number] DOB: [birth year] DOA: 01/20/2021  Referring MD/NP/PA:   PCP: John Hector, MD   Patient coming from:  The patient is coming from home.          Chief Complaint: AMS  HPI: John Reilly is a 65 y.o. male with medical history significant of hypertension, type 1 diabetes on insulin pump, COPD, GERD, PVD, Parkinson's disease, orthostatic hypotension, heavy drinker (drinks 142 ounce beer daily per his wife), who presents with altered mental status.  Per patient's wife at the bedside, patient was found to be unresponsive with altered mental status this morning.  His blood sugar at low at 20s.  Patient was given D50 and glucagon by EMS, patient gradually wakes up. When I saw patient in ED, he is drowsy, but oriented x3.  He moves all extremities.  No facial droop or slurred speech.  Patient has cough with clear mucus production, mild shortness breath, no chest pain.  Denies symptoms of UTI.  No nausea vomiting, diarrhea or abdominal pain.  Of note, diabetic educator, John Reilly did great work in finding out why pt has hypoglycemia.  Per her note, " It appears that patient has not changed his Dexcom sensor and it is expired.  The sensor allows the insulin pump reduce dose when blood sugar is dropping to prevent lows.  It also alarms so that the patient knows that blood sugar is dropping.  This function was not working when patient had low blood sugar".  ED Course: pt was found to have WBC 6.5, lactic acid of 1.5, troponin level 8, 7, negative COVID PCR, CK level 19, electrolytes renal function okay, temperature 96.3, softer blood pressure, heart rate 118, RR 20, oxygen saturation 98% on room air.  Chest x-ray showed Reilly lower lobe infiltration.  CT of head and CT of C-spine negative for acute issues.  CT angiogram of chest is negative for PE.  Patient is admitted to progressive bed as inpatient.  Review of Systems:   General: no fevers,  chills, no body weight gain, has fatigue HEENT: no blurry vision, hearing changes or sore throat Respiratory: has dyspnea, coughing, no wheezing CV: no chest pain, no palpitations GI: no nausea, vomiting, abdominal pain, diarrhea, constipation GU: no dysuria, burning on urination, increased urinary frequency, hematuria  Ext: has mild leg edema Neuro: no unilateral weakness, numbness, or tingling, no vision change or hearing loss. Has AMS Skin: no rash, no skin tear. MSK: No muscle spasm, no deformity, no limitation of range of movement in spin Heme: No easy bruising.  Travel history: No recent long distant travel.  Allergy:  Allergies  Allergen Reactions   Penicillins Rash and Other (See Comments)    Has patient had a PCN reaction causing immediate rash, facial/tongue/throat swelling, SOB or lightheadedness with hypotension: Yes Has patient had a PCN reaction causing severe rash involving mucus membranes or skin necrosis: No Has patient had a PCN reaction that required hospitalization: No Has patient had a PCN reaction occurring within the last 10 years: No If all of the above answers are "NO", then may proceed with Cephalosporin use. Other reaction(s): RASH Has patient had a PCN reaction causing immediate rash, facial/tongue/throat swelling, SOB or lightheadedness with hypotension: Yes Has patient had a PCN reaction causing severe rash involving mucus membranes or skin necrosis: No Has patient had a PCN reaction that required hospitalization: No Has patient had a PCN reaction occurring within the last 10  years: No If all of the above answers are "NO", then may proceed with Cephalosporin use. Reaction:  Unknown; childhood reaction  Has patient had a PCN reaction causing immediate rash, facial/tongue/throat swelling, SOB or lightheadedness with hypotension: Yes Has patient had a PCN reaction causing severe rash involving mucus membranes or skin necrosis: No Has patient had a PCN  reaction that required hospitalization: No Has patient had a PCN reaction occurring within the last 10 years: No If all of the above answers are "NO", then may proceed with Cephalosporin use.   Sulfa Antibiotics Rash    John Reilly Syndrome (SJS)     Past Medical History:  Diagnosis Date   Cancer ([place])    Pt reports on 10/02/17 that he has never been dx with cancer   Colon polyps    COPD (chronic obstructive pulmonary disease) ([place])    Diabetes mellitus without complication (HCC)    type 1   GERD (gastroesophageal reflux disease)    Hip fracture (HCC)    History of malignant neoplasm of colon    Hypertension    Multiple duodenal ulcers    Parkinson disease ([place])    Peripheral vascular disease ([place])    Peripheral Neuropathy   Retinopathy    T12 compression fracture ([place]) 11/2020    Past Surgical History:  Procedure Laterality Date   ANKLE ARTHROPLASTY Reilly 12/2011   [place], 2002, 2006, 2009, 2013, 2018   COLONOSCOPY WITH PROPOFOL N/A 10/12/2016   Procedure: COLONOSCOPY WITH PROPOFOL;  Surgeon: Manya Silvas, MD;  Location: [facility] ENDOSCOPY;  Service: Endoscopy;  Laterality: N/A;   ESOPHAGOGASTRODUODENOSCOPY  07/09/2011   FINGER SURGERY Reilly 09/2007   thumb repair due to crushing injury; metal hardware   [place] (IM) NAIL INTERTROCHANTERIC Left 08/08/2018   Procedure: INTRAMEDULLARY ([place]) NAIL INTERTROCHANTRIC;  Surgeon: Corky Mull, MD;  Location: [facility] ORS;  Service: Orthopedics;  Laterality: Left;   KYPHOPLASTY N/A 12/12/2020   Procedure: T 12 KYPHOPLASTY;  Surgeon: Hessie Knows, MD;  Location: [facility] ORS;  Service: Orthopedics;  Laterality: N/A;    Social History:  reports that he quit smoking about 15 years ago. His smoking use included cigarettes. He has never used smokeless tobacco. He reports current alcohol use of about 21.0 standard drinks per week. He reports that he does not currently use drugs  after having used the following drugs: Marijuana.  Family History:  Family History  Problem Relation Age of Onset   Stroke Mother    Alzheimer's disease Father    Diabetes Brother      Prior to Admission medications   Medication Sig Start Date End Date Taking? Authorizing Provider  acetaminophen (TYLENOL) 500 MG tablet Take 1,000 mg by mouth every 6 (six) hours as needed for moderate pain or mild pain.    [provider]  carbidopa-levodopa (SINEMET CR) 50-200 MG tablet Take 1 tablet by mouth at bedtime.     [provider]  carbidopa-levodopa (SINEMET IR) 25-250 MG tablet Take 1 tablet by mouth 4 (four) times daily.     [provider]  fludrocortisone (FLORINEF) 0.1 MG tablet Take 200 mcg by mouth daily. 09/20/20   [provider]  folic acid (FOLVITE) [id number] MCG tablet Take 800 mcg by mouth daily.     [provider]  HYDROcodone-acetaminophen (NORCO) 5-325 MG tablet Take 1 tablet by mouth every 6 (six) hours as needed. Patient not taking: Reported on 01/16/2021  12/12/20   Hessie Knows, MD  Insulin Infusion Pump Supplies (PARADIGM PUMP RESERVOIR 1.8ML) MISC 1 Device as directed.  09/07/18   [provider]  insulin lispro (HUMALOG) 100 UNIT/ML injection 22-55 Units daily. In Pump 11/04/20   [provider]  meloxicam (MOBIC) 7.5 MG tablet Take 7.5 mg by mouth at bedtime. 07/10/20   [provider]  metoprolol succinate (TOPROL-XL) 50 MG 24 hr tablet Take 25 mg by mouth daily.    [provider]  midodrine (PROAMATINE) 2.5 MG tablet Take 2.5 mg by mouth 2 (two) times daily. 01/15/21   [provider]  Multiple Vitamin (MULTIVITAMIN WITH MINERALS) TABS tablet Take 1 tablet by mouth daily.    [provider]  omeprazole (PRILOSEC) 40 MG capsule Take 40 mg by mouth at bedtime.    [provider]  Pancrelipase, Lip-Prot-Amyl, (CREON PO) Take by mouth 3 (three) times daily.    [provider]  potassium chloride SA (KLOR-CON) 20 MEQ tablet Take 20 mEq by mouth daily. 11/15/20   [provider]    Physical Exam: Vitals:   01/20/21 1400 01/20/21 1430 01/20/21 1500 01/20/21 1607  BP: 118/73 119/74 109/74 123/71  Pulse: (!) 104 (!) 104 (!) 106 (!) 111  Resp: '19 19 18 18  '$ Temp:    97.7 F (36.5 C)  TempSrc:      SpO2: 100% 98% 98% (!) 80%  Weight:       General: Not in acute distress HEENT:       Eyes: PERRL, EOMI, no scleral icterus.       ENT: No discharge from the ears and nose, no pharynx injection, no tonsillar enlargement.        Neck: No JVD, no bruit, no mass felt. Heme: No neck lymph node enlargement. Cardiac: S1/S2, RRR, No murmurs, No gallops or rubs. Respiratory: No rales, wheezing, rhonchi or rubs. GI: Soft, nondistended, nontender, no organomegaly, BS present. GU: No hematuria Ext: No pitting leg edema bilaterally. 1+DP/PT pulse bilaterally. Musculoskeletal: No joint deformities, No joint redness or warmth, no limitation of ROM in spin. Skin: No rashes.  Neuro: Patient is drowsy, but arousable, oriented x3, cranial nerves II-XII grossly intact, moves all extremities normally.  Psych: Patient is not psychotic, no suicidal or hemocidal ideation.  Labs on Admission: I have personally reviewed following labs and imaging studies  CBC: Recent Labs  Lab 01/20/21 1013  WBC 6.5  NEUTROABS 5.1  HGB 11.5*  HCT 33.5*  MCV 96.8  PLT 123XX123*   Basic Metabolic Panel: Recent Labs  Lab 01/20/21 1013  NA 130*  K 3.9  CL 100  CO2 23  GLUCOSE 167*  BUN 11  CREATININE 0.83  CALCIUM 7.6*   GFR: Estimated Creatinine Clearance: 73.1 mL/min (by C-G formula based on SCr of 0.83 mg/dL). Liver Function Tests: Recent Labs  Lab 01/20/21 1013  AST 36  ALT 15  ALKPHOS 183*  BILITOT 0.7  PROT 5.5*  ALBUMIN 2.2*   No results for input(s): LIPASE, AMYLASE in the last 168 hours. No results for input(s): AMMONIA in the last 168 hours. Coagulation  Profile: No results for input(s): INR, PROTIME in the last 168 hours. Cardiac Enzymes: Recent Labs  Lab 01/20/21 1013  CKTOTAL 19*   BNP (last 3 results) No results for input(s): PROBNP in the last 8760 hours. HbA1C: No results for input(s): HGBA1C in the last 72 hours. CBG: Recent Labs  Lab 01/20/21 1246 01/20/21 1303 01/20/21 1306 01/20/21 1334  01/20/21 1430  GLUCAP 50* 55* 71 81 99   Lipid Profile: No results for input(s): CHOL, HDL, LDLCALC, TRIG, CHOLHDL, LDLDIRECT in the last 72 hours. Thyroid Function Tests: No results for input(s): TSH, T4TOTAL, FREET4, T3FREE, THYROIDAB in the last 72 hours. Anemia Panel: No results for input(s): VITAMINB12, FOLATE, FERRITIN, TIBC, IRON, RETICCTPCT in the last 72 hours. Urine analysis:    Component Value Date/Time   COLORURINE YELLOW (A) 03/22/2020 1354   APPEARANCEUR CLEAR (A) 03/22/2020 1354   LABSPEC 1.013 03/22/2020 1354   PHURINE 5.0 03/22/2020 1354   GLUCOSEU >=500 (A) 03/22/2020 1354   HGBUR NEGATIVE 03/22/2020 1354   BILIRUBINUR NEGATIVE 03/22/2020 1354   KETONESUR 20 (A) 03/22/2020 1354   PROTEINUR 100 (A) 03/22/2020 1354   NITRITE NEGATIVE 03/22/2020 1354   LEUKOCYTESUR SMALL (A) 03/22/2020 1354   Sepsis Labs: '@LABRCNTIP'$ (procalcitonin:4,lacticidven:4) ) Recent Results (from the past 240 hour(s))  Resp Panel by RT-PCR (Flu A&B, Covid) Nasopharyngeal Swab     Status: None   Collection Time: 01/20/21 10:17 AM   Specimen: Nasopharyngeal Swab; Nasopharyngeal(NP) swabs in vial transport medium  Result Value Ref Range Status   SARS Coronavirus 2 by RT PCR NEGATIVE NEGATIVE Final    Comment: (NOTE) SARS-CoV-2 target nucleic acids are NOT DETECTED.  The SARS-CoV-2 RNA is generally detectable in upper respiratory specimens during the acute phase of infection. The lowest concentration of SARS-CoV-2 viral copies this assay can detect is 138 copies/mL. A negative result does not preclude SARS-Cov-2 infection and should  not be used as the sole basis for treatment or other patient management decisions. A negative result may occur with  improper specimen collection/handling, submission of specimen other than nasopharyngeal swab, presence of viral mutation(s) within the areas targeted by this assay, and inadequate number of viral copies(<138 copies/mL). A negative result must be combined with clinical observations, patient history, and epidemiological information. The expected result is Negative.  Fact Sheet for Patients:  EntrepreneurPulse.com.au  Fact Sheet for Healthcare Providers:  IncredibleEmployment.be  This test is no t yet approved or cleared by the [place] FDA and  has been authorized for detection and/or diagnosis of SARS-CoV-2 by FDA under an Emergency Use Authorization (EUA). This EUA will remain  in effect (meaning this test can be used) for the duration of the COVID-19 declaration under Section 564(b)(1) of the Act, 21 U.S.C.section 360bbb-3(b)(1), unless the authorization is terminated  or revoked sooner.       Influenza A by PCR NEGATIVE NEGATIVE Final   Influenza B by PCR NEGATIVE NEGATIVE Final    Comment: (NOTE) The Xpert Xpress SARS-CoV-2/FLU/RSV plus assay is intended as an aid in the diagnosis of influenza from Nasopharyngeal swab specimens and should not be used as a sole basis for treatment. Nasal washings and aspirates are unacceptable for Xpert Xpress SARS-CoV-2/FLU/RSV testing.  Fact Sheet for Patients: EntrepreneurPulse.com.au  Fact Sheet for Healthcare Providers: IncredibleEmployment.be  This test is not yet approved or cleared by the [place] FDA and has been authorized for detection and/or diagnosis of SARS-CoV-2 by FDA under an Emergency Use Authorization (EUA). This EUA will remain in effect (meaning this test can be used) for the duration of the COVID-19 declaration under  Section 564(b)(1) of the Act, 21 U.S.C. section 360bbb-3(b)(1), unless the authorization is terminated or revoked.  Performed at [facility], [address] [place], [place] [address]      Radiological Exams on Admission: CT HEAD WO CONTRAST (5MM)  Result Date: 01/20/2021 CLINICAL DATA:  Head trauma, minor EXAM: CT HEAD WITHOUT CONTRAST TECHNIQUE: Contiguous axial images were obtained from the base of the skull through the vertex without intravenous contrast. COMPARISON:  07/19/2020 FINDINGS: Brain: There is no acute intracranial hemorrhage, mass effect, or edema. Gray-white differentiation is preserved. There is no extra-axial fluid collection. Ventricles and sulci are stable in size and configuration. Patchy low-attenuation in the supratentorial white matter is nonspecific but probably reflects stable chronic microvascular ischemic changes. Vascular: There is mild atherosclerotic calcification at the skull base. Skull: Calvarium is unremarkable. Sinuses/Orbits: No acute finding. Other: None. IMPRESSION: No evidence of acute intracranial injury. Electronically Signed   By: Macy Mis M.D.   On: 01/20/2021 14:26   CT Angio Chest PE W and/or Wo Contrast  Result Date: 01/20/2021 CLINICAL DATA:  Suspected pulmonary embolism in a 64 year old male with hypoglycemia and unresponsiveness. EXAM: CT ANGIOGRAPHY CHEST WITH CONTRAST TECHNIQUE: Multidetector CT imaging of the chest was performed using the standard protocol during bolus administration of intravenous contrast. Multiplanar CT image reconstructions and MIPs were obtained to evaluate the vascular anatomy. CONTRAST:  56m OMNIPAQUE IOHEXOL 350 MG/ML SOLN COMPARISON:  Chest x-ray from January 20, 2021. No prior cross-sectional imaging of the chest for comparison. FINDINGS: Cardiovascular: Normal caliber of the thoracic aorta. Normal heart size without substantial pericardial effusion. Adequate opacification of pulmonary vasculature.  No sign of pulmonary embolism. Mediastinum/Nodes: No adenopathy in the chest. Esophagus grossly normal. Lungs/Pleura: Small to moderate bilateral pleural effusions Reilly greater than LEFT layering dependently in the bilateral chest. Ground-glass and consolidative changes also Reilly greater than LEFT. Filling defects in Reilly lower lobe bronchi. Material in the trachea as well. LEFT lower lobe bronchi are patent. No pneumothorax. Upper Abdomen: Incidental imaging of upper abdominal contents without acute process very limited assessment. Musculoskeletal: Partially imaged changes of cement augmentation at the T12 vertebral level. T5 with approximally 20-30% loss of height, some surrounding degenerative change. No surrounding stranding. Mild loss of height at T8 also with some surrounding degenerative change. Findings similar to prior MRI of the spine. Evidence of prior LEFT humeral fracture. Review of the MIP images confirms the above findings. IMPRESSION: Negative for pulmonary embolism. Findings of suspected aspiration related pneumonia in the Reilly lower lobe, airspace disease seen to a lesser extent in the LEFT chest and associated with bilateral effusions. Suggest follow-up to ensure resolution. Signs of thoracic spine compression fractures as on recent spinal Aortic Atherosclerosis (ICD10-I70.0).  MRI. Electronically Signed   By: GZetta BillsM.D.   On: 01/20/2021 14:31   CT Cervical Spine Wo Contrast  Result Date: 01/20/2021 CLINICAL DATA:  Neck trauma EXAM: CT CERVICAL SPINE WITHOUT CONTRAST TECHNIQUE: Multidetector CT imaging of the cervical spine was performed without intravenous contrast. Multiplanar CT image reconstructions were also generated. COMPARISON:  07/19/2020 FINDINGS: Alignment: Stable Skull base and vertebrae: Stable vertebral body heights. No acute fracture. Soft tissues and spinal canal: No prevertebral fluid or swelling. No visible canal hematoma. Disc levels: Multilevel degenerative  changes are present including disc space narrowing, endplate osteophytes, and facet and uncovertebral hypertrophy. Upper chest: No apical lung mass. Other: None IMPRESSION: No acute cervical spine fracture. Electronically Signed   By: PMacy MisM.D.   On: 01/20/2021 14:32   DG Chest Portable 1 View  Result Date: 01/20/2021 CLINICAL DATA:  65year old male with hypoglycemia.  Cough. EXAM: PORTABLE CHEST 1 VIEW COMPARISON:  Portable chest 03/23/2020 and earlier. FINDINGS: Portable AP upright view at 1028 hours. Chronic proximal left humerus deformity. Improved lung volumes  from last year. Resolved streaky left lung base opacity, but similar streaky and now confluent Reilly lung base opacity. Mediastinal contours remain normal. Elsewhere the lungs appear clear. No pneumothorax or pleural effusion. However, a mildly displaced Reilly lateral 5th rib fracture is new. But other chronic bilateral rib fractures are redemonstrated (Reilly 2nd, left 4th, 5th, 8th). IMPRESSION: 1. Confluent Reilly lung base opacity, with similar bibasilar opacity last year but resolved on the left. Consider acute aspiration and/or pneumonia in this setting. 2. Acute appearing and mildly displaced Reilly lateral 5th rib fracture. There are other chronic bilateral rib fractures. Electronically Signed   By: Genevie Ann M.D.   On: 01/20/2021 10:58     EKG: I have personally reviewed.  Sinus rhythm, QTC 461, poor quality of EKG strips, low voltage, anteroseptal infarction pattern  Assessment/Plan Principal Problem:   Aspiration pneumonia (HCC) Active Problems:   Hypoglycemia   Type I diabetes mellitus (HCC)   Acute metabolic encephalopathy   COPD (chronic obstructive pulmonary disease) (HCC)   Hypertension   Orthostatic hypotension   Parkinson disease (HCC)   Hypothermia   Alcohol abuse   Sepsis ([place])   Aspiration pneumonia and sepsis: Chest x-ray showed Reilly lower lobe infiltration.  Patient meets criteria for sepsis with  tachycardia with heart rate 118 and hypothermia with body temperature 96.3.  Lactic acid is normal.  Blood pressure is soft  -Admited to progressive unit as inpatient - IV Rocephin and Flagyl (patient received 1 dose of clindamycin in ED) - Mucinex for cough  - Bronchodilators - Urine legionella and S. pneumococcal antigen - Follow up blood culture x2, sputum culture  - will get Procalcitonin and trend lactic acid - IVF: 1L of NS bolus in ED, followed by d5-NS at 75 cc/h  Hypoglycemia -Discontinue insulin pump -As needed D50 -Patient is on  d5-NS at 75 cc/h  Type I diabetes mellitus ([place]) without complications: Recent 123456 6.6, patient is on insulin pump, well controlled. -Discontinue insulin pump -Started glargine insulin 4 units daily -Sliding scale insulin (very sensitive scale)  Acute metabolic encephalopathy: Most likely due to hypoglycemia.  Patient wakes up along with correction of hypoglycemia.  CT head negative. -Frequent neuro check  COPD (chronic obstructive pulmonary disease) (HCC) -Bronchodilators  Hypertension -Hold metoprolol due to softer blood pressure  Orthostatic hypotension -Continue midodrine and Florinef  Parkinson disease (HCC) -Sinemet  Hypothermia: Initial temperature 96.3, improved to 97.7 likely due to hypoglycemia, but patient also meets criteria for l sepsis -Monitor  Alcohol abuse -CIWA protocol      DVT ppx: SQ Lovenox Code Status: Full code per his wife and patient Family Communication: Yes, patient's wife   at bed side Disposition Plan:  Anticipate discharge back to previous environment Consults called: None Admission status and Level of care: Progressive Cardiac:  as inpt     Status is: Inpatient  Remains inpatient appropriate because:Inpatient level of care appropriate due to severity of illness  Dispo: The patient is from: Home              Anticipated d/c is to: Home              Patient currently is not medically stable  to d/c.   Difficult to place patient No         Date of Service 01/20/2021    [facility]   If 7PM-7AM, please contact night-coverage [email or web address] 01/20/2021, 4:53 PM

## 2021-01-20 NOTE — ED Notes (Signed)
Patient is confused when asking him about his insulin pump, but otherwise oriented.

## 2021-01-20 NOTE — Progress Notes (Signed)
Inpatient Diabetes Program Recommendations  AACE/ADA: New Consensus Statement on Inpatient Glycemic Control (2015)  Target Ranges:  Prepandial:   less than 140 mg/dL      Peak postprandial:   less than 180 mg/dL (1-2 hours)      Critically ill patients:  140 - 180 mg/dL   Lab Results  Component Value Date   GLUCAP 99 01/20/2021   HGBA1C 6.6 (H) 03/21/2020    Review of Glycemic Control Results for John Reilly, John Reilly (MRN [id number]) as of 01/20/2021 16:45  Ref. Range 01/20/2021 12:46 01/20/2021 13:03 01/20/2021 13:06 01/20/2021 13:34 01/20/2021 14:30  Glucose-Capillary Latest Ref Range: 70 - 99 mg/dL 50 (L) 55 (L) 71 81 99   Patient now admitted to floor.  Blood sugars remain on the low side.  Per MD, insulin pump to be removed for now and patient will be managed with SQ insulin.  Removed old insulin pump site from patient.  Wife has brought all supplies for insulin pump and Dexcom sensor.  Placed in bag with patient identification and locked in medication room.  Discussed with RN.  Patient is oriented but seems confused regarding use of insulin pump.  Will follow.   Thanks,  Adah Perl, RN, BC-ADM Inpatient Diabetes Coordinator Pager [phone]

## 2021-01-20 NOTE — Progress Notes (Signed)
CODE SEPSIS - PHARMACY COMMUNICATION  **Broad Spectrum Antibiotics should be administered within 1 hour of Sepsis diagnosis**  Time Code Sepsis Called/Page Received: 1233  Antibiotics Ordered: ceftriaxone IV 1 gram x 1, azithromycin IV 500 mg x 1, clindamycin IV 600 mg x 1  Time of 1st antibiotic administration: [id number]  Additional action taken by pharmacy: Messaged RN  If necessary, Name of Provider/Nurse Contacted: Hester Mates, RN   Wynelle Cleveland, PharmD Pharmacy Resident  01/20/2021 12:38 PM

## 2021-01-20 NOTE — Sepsis Progress Note (Signed)
Sepsis protocol is being followed by [facility]. 

## 2021-01-20 NOTE — ED Notes (Signed)
Patient's blood sugar continues to be low despite interventions. Patient has had orange juice and 1/2 amp of D50 with no relief of hypoglycemia. Dr. Jari Pigg aware. Patient awake, but less responsive than earlier.

## 2021-01-20 NOTE — ED Triage Notes (Signed)
Patient to ER from home via [facility] for c/o hypoglycemia after call to home for unresponsiveness. Initial CBG was 20 for EMS. Patient received Glucagon and D50 amp. Next CBG was 70. Patient suffers from orthostatic hypotension and Parkinson's and had home health at home that discovered patient's condition. BP for EMS was 119/80. Patient wad diaphoretic upon EMS arrival to home.

## 2021-01-20 NOTE — ED Notes (Signed)
Unproductive cough 

## 2021-01-20 NOTE — ED Provider Notes (Signed)
[facility] Emergency Department Provider Note  ____________________________________________   Event Date/Time   First MD Initiated Contact with Patient 01/20/21 1006     (approximate)  I have reviewed the triage vital signs and the nursing notes.   HISTORY  Chief Complaint Hypoglycemia    HPI John Reilly is a 65 y.o. male with type 1 diabetes, Parkinson's disease who comes in with concerns for hypoglycemia.  Patient wife last saw him last night around midnight.  At that time he was his normal self.  When PT came today they found that he was unresponsive in his recliner.  Patient noted to have a sugar of 20.  Patient was given some glucagon and some D10 and repeat sugars were in the 70s.  On repeat assessment patient was now back to baseline.  Unsure of any falls.  Denies any symptoms at this time but history is somewhat limited due to his history of Parkinson's disease.          Past Medical History:  Diagnosis Date   Cancer ([place])    Pt reports on 10/02/17 that he has never been dx with cancer   Colon polyps    COPD (chronic obstructive pulmonary disease) ([place])    Diabetes mellitus without complication ([place])    type 1   GERD (gastroesophageal reflux disease)    Hip fracture (HCC)    History of malignant neoplasm of colon    Hypertension    Multiple duodenal ulcers    Parkinson disease ([place])    Peripheral vascular disease ([place])    Peripheral Neuropathy   Retinopathy    T12 compression fracture ([place]) 11/2020    Patient Active Problem List   Diagnosis Date Noted   AMS (altered mental status) 03/21/2020   Hypoglycemia due to insulin 03/21/2020   Type 2 diabetes mellitus with vascular disease ([place]) 03/11/2020   Pain due to onychomycosis of toenails of both feet 09/07/2019   Dermatitis 09/07/2019   S/p left hip fracture 08/10/2018   Altered mental status    Closed fracture of left hip ([place])    Fall    DKA (diabetic ketoacidoses)  10/13/2017   Malnutrition of moderate degree 10/04/2017   Humeral fracture 10/03/2017   Ulcer of toe ([place]) 04/27/2015   Hyponatremia 03/15/2015    Past Surgical History:  Procedure Laterality Date   ANKLE ARTHROPLASTY Right 12/2011   COLONOSCOPY     1998, 1998, 1999, 2002, 2006, 2009, 2013, 2018   COLONOSCOPY WITH PROPOFOL N/A 10/12/2016   Procedure: COLONOSCOPY WITH PROPOFOL;  Surgeon: Manya Silvas, MD;  Location: [facility] ENDOSCOPY;  Service: Endoscopy;  Laterality: N/A;   ESOPHAGOGASTRODUODENOSCOPY  07/09/2011   FINGER SURGERY Right 09/2007   thumb repair due to crushing injury; metal hardware   [place] (IM) NAIL INTERTROCHANTERIC Left 08/08/2018   Procedure: INTRAMEDULLARY (IM) NAIL INTERTROCHANTRIC;  Surgeon: Corky Mull, MD;  Location: [facility] ORS;  Service: Orthopedics;  Laterality: Left;   KYPHOPLASTY N/A 12/12/2020   Procedure: T 12 KYPHOPLASTY;  Surgeon: Hessie Knows, MD;  Location: [facility] ORS;  Service: Orthopedics;  Laterality: N/A;    Prior to Admission medications   Medication Sig Start Date End Date Taking? Authorizing Provider  acetaminophen (TYLENOL) 500 MG tablet Take 1,000 mg by mouth every 6 (six) hours as needed for moderate pain or mild pain.    [provider]  carbidopa-levodopa (SINEMET CR) 50-200 MG tablet Take 1 tablet by mouth at bedtime.  [provider]  carbidopa-levodopa (SINEMET IR) 25-250 MG tablet Take 1 tablet by mouth 4 (four) times daily.     [provider]  fludrocortisone (FLORINEF) 0.1 MG tablet Take 200 mcg by mouth daily. 09/20/20   [provider]  folic acid (FOLVITE) 024 MCG tablet Take 800 mcg by mouth daily.     [provider]  HYDROcodone-acetaminophen (NORCO) 5-325 MG tablet Take 1 tablet by mouth every 6 (six) hours as needed. Patient not taking: Reported on 01/16/2021 12/12/20   Hessie Knows, MD  Insulin Infusion Pump Supplies (PARADIGM PUMP RESERVOIR  1.8ML) MISC 1 Device as directed.  09/07/18   [provider]  insulin lispro (HUMALOG) 100 UNIT/ML injection 22-55 Units daily. In Pump 11/04/20   [provider]  meloxicam (MOBIC) 7.5 MG tablet Take 7.5 mg by mouth at bedtime. 07/10/20   [provider]  metoprolol succinate (TOPROL-XL) 50 MG 24 hr tablet Take 25 mg by mouth daily.    [provider]  midodrine (PROAMATINE) 2.5 MG tablet Take 2.5 mg by mouth 2 (two) times daily. 01/15/21   [provider]  Multiple Vitamin (MULTIVITAMIN WITH MINERALS) TABS tablet Take 1 tablet by mouth daily.    [provider]  omeprazole (PRILOSEC) 40 MG capsule Take 40 mg by mouth at bedtime.    [provider]  Pancrelipase, Lip-Prot-Amyl, (CREON PO) Take by mouth 3 (three) times daily.    [provider]  potassium chloride SA (KLOR-CON) 20 MEQ tablet Take 20 mEq by mouth daily. 11/15/20   [provider]    Allergies Penicillins and Sulfa antibiotics  Family History  Problem Relation Age of Onset   Stroke Mother    Alzheimer's disease Father    Diabetes Brother     Social History Social History   Tobacco Use   Smoking status: Former    Types: Cigarettes    Quit date: 10/21/2005    Years since quitting: 15.2   Smokeless tobacco: Never  Vaping Use   Vaping Use: Never used  Substance Use Topics   Alcohol use: Yes    Alcohol/week: 21.0 standard drinks    Types: 21 Cans of beer per week    Comment: 1 40ounce beer a day   Drug use: Not Currently    Types: Marijuana      Review of Systems patient denying the symptoms but somewhat limited secondary to his Parkinson's. Constitutional: No fever/chills Eyes: No visual changes. ENT: No sore throat. Cardiovascular: Denies chest pain. Respiratory: Denies shortness of breath. Gastrointestinal: No abdominal pain.  No nausea, no vomiting.  No diarrhea.  No constipation. Genitourinary: Negative for  dysuria. Musculoskeletal: Negative for back pain. Skin: Negative for rash. Neurological: Negative for headaches, focal weakness or numbness. All other ROS negative ____________________________________________   PHYSICAL EXAM:  VITAL SIGNS: ED Triage Vitals [01/20/21 1009]  Enc Vitals Group     BP      Pulse      Resp      Temp      Temp src      SpO2      Weight 130 lb 1.1 oz (59 kg)     Height      Head Circumference      Peak Flow      Pain Score      Pain Loc      Pain Edu?      Excl. in [place]?     Constitutional: Alert and oriented x2  does not know the year.. Well appearing and in no acute distress. Eyes: Conjunctivae are normal. EOMI. Head: Atraumatic. Nose: No congestion/rhinnorhea. Mouth/Throat: Mucous membranes are moist.   Neck: No stridor. Trachea Midline. FROM Cardiovascular: Tachycardiac, regular rhythm. Grossly normal heart sounds.  Good peripheral circulation. Respiratory: Normal respiratory effort.  No retractions. Lungs CTAB. Gastrointestinal: Soft and nontender. No distention. No abdominal bruits.  Insulin monitor and glucose monitor noted attached to his stomach Musculoskeletal: No lower extremity tenderness nor edema.  No joint effusions. Neurologic:  Normal speech and language. No gross focal neurologic deficits are appreciated.  Equal strength in arms and legs. Skin:  Skin is warm, dry and intact. No rash noted. Psychiatric: Mood and affect are normal. Speech and behavior are normal. GU: Deferred   ____________________________________________   LABS (all labs ordered are listed, but only abnormal results are displayed)  Labs Reviewed  RESP PANEL BY RT-PCR (FLU A&B, COVID) ARPGX2  CBC WITH DIFFERENTIAL/PLATELET  COMPREHENSIVE METABOLIC PANEL  URINALYSIS, ROUTINE W REFLEX MICROSCOPIC  CBG MONITORING, ED   ____________________________________________   ED ECG REPORT I, Vanessa [place], the attending physician, personally viewed and interpreted  this ECG.  Very difficult to read secondary to patient's tremor but does appear to be tachycardic without any other obvious ST elevation but I cannot really assess his intervals due to the tremor ____________________________________________  RADIOLOGY I, Vanessa [place], personally viewed and evaluated these images (plain radiographs) as part of my medical decision making, as well as reviewing the written report by the radiologist.  ED MD interpretation: Right lung base opacification  Official radiology report(s): DG Chest Portable 1 View  Result Date: 01/20/2021 CLINICAL DATA:  65 year old male with hypoglycemia.  Cough. EXAM: PORTABLE CHEST 1 VIEW COMPARISON:  Portable chest 03/23/2020 and earlier. FINDINGS: Portable AP upright view at 1028 hours. Chronic proximal left humerus deformity. Improved lung volumes from last year. Resolved streaky left lung base opacity, but similar streaky and now confluent right lung base opacity. Mediastinal contours remain normal. Elsewhere the lungs appear clear. No pneumothorax or pleural effusion. However, a mildly displaced right lateral 5th rib fracture is new. But other chronic bilateral rib fractures are redemonstrated (right 2nd, left 4th, 5th, 8th). IMPRESSION: 1. Confluent right lung base opacity, with similar bibasilar opacity last year but resolved on the left. Consider acute aspiration and/or pneumonia in this setting. 2. Acute appearing and mildly displaced right lateral 5th rib fracture. There are other chronic bilateral rib fractures. Electronically Signed   By: Genevie Ann M.D.   On: 01/20/2021 10:58    ____________________________________________   PROCEDURES  Procedure(s) performed (including Critical Care):  .1-3 Lead EKG Interpretation Performed by: Vanessa [place], MD Authorized by: Vanessa [place], MD     Interpretation: abnormal     ECG rate:  100s   ECG rate assessment: tachycardic     Rhythm: sinus rhythm     Ectopy: none     Conduction:  normal   .Critical Care Performed by: Vanessa [place], MD Authorized by: Vanessa , MD   Critical care provider statement:    Critical care time (minutes):  45   Critical care was necessary to treat or prevent imminent or life-threatening deterioration of the following conditions:  Endocrine crisis   Critical care was time spent personally by me on the following activities:  Discussions with consultants, evaluation of patient's response to treatment, examination of patient, ordering and performing treatments and interventions, ordering and review of laboratory studies, ordering  and review of radiographic studies, pulse oximetry, re-evaluation of patient's condition, obtaining history from patient or surrogate and review of old charts   ____________________________________________   INITIAL IMPRESSION / ASSESSMENT AND PLAN / ED COURSE  Milo LYNCOLN LEDGERWOOD was evaluated in Emergency Department on 01/20/2021 for the symptoms described in the history of present illness. He was evaluated in the context of the global COVID-19 pandemic, which necessitated consideration that the patient might be at risk for infection with the SARS-CoV-2 virus that causes COVID-19. Institutional protocols and algorithms that pertain to the evaluation of patients at risk for COVID-19 are in a state of rapid change based on information released by regulatory bodies including the CDC and federal and state organizations. These policies and algorithms were followed during the patient's care in the ED.     Patient comes in with altered mental status.  Patient's mental status was improving with correction of his hypoglycemia.  The diabetes coordinator did come and evaluate him given he does have an insulin pump that was currently detached.  Our plan was to replace it but he was remained hyperglycemic.  There was also concern that his glucose monitor was not correctly synced with his insulin pump and this might of contributed to the  hypoglycemia episodes.  I had gotten a chest x-ray that was concerning for acute rib fracture.  Patient does not really remember any for all and given his not great mental status in association with his hyperglycemia I have elected to do a CT head and CT cervical to make sure no evidence of intercranial hemorrhage or cervical fracture as well as CT PE given the continued tachycardia.  Patient was also noted to be hypothermic so warm blanket was placed and patient was given warm IV fluids and this helped to resolve his hypothermia.  Given the chest x-ray was concerning for the potential of aspiration pneumonia he met sepsis criteria with his heart rate and temperature sepsis alert was called and patient was started on broad-spectrum antibiotics to cover possible aspiration pneumonia.  Patient's abdomen is soft and nontender and his mental status is greatly improved after the correction of his hypoglycemia.  1:12 PM patient had continued episode of hypoglycemia was given some orange juice without resolution of it and he is then given a half of D50 given my concern that he could go into DKA very easily and his sugar is barely elevated.  His sugars on repeat were again in the 60s therefore patient was given another half of D50 and started on a D10 drip.  Note patient was given a fluid bolus of 1 L for his tachycardia and his hypothermia but his glucose will continue to drop therefore instead I will give a drip of 100 mils per hour with D10   Given the recurrent hypoglycemia concern for potential sepsis will admit patient to the hospital team for further work-up        ____________________________________________   FINAL CLINICAL IMPRESSION(S) / ED DIAGNOSES   Final diagnoses:  Sepsis, due to unspecified organism, unspecified whether acute organ dysfunction present ([place])  Hypoglycemia      MEDICATIONS GIVEN DURING THIS VISIT:  Medications  azithromycin (ZITHROMAX) 500 mg in sodium chloride  0.9 % 250 mL IVPB (has no administration in time range)  clindamycin (CLEOCIN) IVPB 600 mg (has no administration in time range)  dextrose 5 %-0.9 % sodium chloride infusion ( Intravenous New Bag/Given 01/20/21 1332)  sodium chloride 0.9 % bolus 1,000 mL (1,000 mLs Intravenous  Bolus 01/20/21 1048)  cefTRIAXone (ROCEPHIN) 1 g in sodium chloride 0.9 % 100 mL IVPB (1 g Intravenous New Bag/Given 01/20/21 1322)  dextrose 50 % solution 25 mL (25 mLs Intravenous Given 01/20/21 1252)  dextrose 50 % solution 25 mL (25 mLs Intravenous Given 01/20/21 1317)  iohexol (OMNIPAQUE) 350 MG/ML injection 75 mL (75 mLs Intravenous Contrast Given 01/20/21 1346)     ED Discharge Orders     None        Note:  This document was prepared using Dragon voice recognition software and may include unintentional dictation errors.    Vanessa [place], MD 01/20/21 1414

## 2021-01-20 NOTE — ED Notes (Signed)
Unable to obtain second set of cultures at this time despite several attempts from this RN and second RN. Lab to come collect 2nd set.

## 2021-01-20 NOTE — ED Notes (Signed)
Patient's blood sugar low again despite earlier interventions. Dr. Jari Pigg aware. Dr. Jari Pigg also notified of rectal temp of 96.3.

## 2021-01-21 DIAGNOSIS — G20 Parkinson's disease: Secondary | ICD-10-CM

## 2021-01-21 LAB — CBC
HCT: 28.7 % — ABNORMAL LOW (ref 39.0–52.0)
Hemoglobin: 9.8 g/dL — ABNORMAL LOW (ref 13.0–17.0)
MCH: 33.8 pg (ref 26.0–34.0)
MCHC: 34.1 g/dL (ref 30.0–36.0)
MCV: 99 fL (ref 80.0–100.0)
Platelets: 404 10*3/uL — ABNORMAL HIGH (ref 150–400)
RBC: 2.9 MIL/uL — ABNORMAL LOW (ref 4.22–5.81)
RDW: 14.6 % (ref 11.5–15.5)
WBC: 10.9 10*3/uL — ABNORMAL HIGH (ref 4.0–10.5)
nRBC: 0 % (ref 0.0–0.2)

## 2021-01-21 LAB — GLUCOSE, CAPILLARY
Glucose-Capillary: 105 mg/dL — ABNORMAL HIGH (ref 70–99)
Glucose-Capillary: 126 mg/dL — ABNORMAL HIGH (ref 70–99)
Glucose-Capillary: 134 mg/dL — ABNORMAL HIGH (ref 70–99)
Glucose-Capillary: 142 mg/dL — ABNORMAL HIGH (ref 70–99)
Glucose-Capillary: 170 mg/dL — ABNORMAL HIGH (ref 70–99)
Glucose-Capillary: 34 mg/dL — CL (ref 70–99)
Glucose-Capillary: 58 mg/dL — ABNORMAL LOW (ref 70–99)
Glucose-Capillary: 69 mg/dL — ABNORMAL LOW (ref 70–99)
Glucose-Capillary: 72 mg/dL (ref 70–99)
Glucose-Capillary: 75 mg/dL (ref 70–99)
Glucose-Capillary: 94 mg/dL (ref 70–99)

## 2021-01-21 LAB — BASIC METABOLIC PANEL
Anion gap: 4 — ABNORMAL LOW (ref 5–15)
BUN: 12 mg/dL (ref 8–23)
CO2: 22 mmol/L (ref 22–32)
Calcium: 7.4 mg/dL — ABNORMAL LOW (ref 8.9–10.3)
Chloride: 105 mmol/L (ref 98–111)
Creatinine, Ser: 0.87 mg/dL (ref 0.61–1.24)
GFR, Estimated: >60 mL/min (ref >60)
Glucose, Bld: 81 mg/dL (ref 70–99)
Potassium: 3.6 mmol/L (ref 3.5–5.1)
Sodium: 131 mmol/L — ABNORMAL LOW (ref 135–145)

## 2021-01-21 LAB — URINALYSIS, COMPLETE (UACMP) WITH MICROSCOPIC
Bacteria, UA: NONE SEEN
Bilirubin Urine: NEGATIVE
Glucose, UA: 150 mg/dL — AB
Hgb urine dipstick: NEGATIVE
Ketones, ur: 20 mg/dL — AB
Leukocytes,Ua: NEGATIVE
Nitrite: NEGATIVE
Protein, ur: NEGATIVE mg/dL
Specific Gravity, Urine: 1.04 — ABNORMAL HIGH (ref 1.005–1.030)
Squamous Epithelial / HPF: NONE SEEN (ref 0–5)
pH: 6 (ref 5.0–8.0)

## 2021-01-21 LAB — STREP PNEUMONIAE URINARY ANTIGEN: Strep Pneumo Urinary Antigen: NEGATIVE

## 2021-01-21 LAB — PROCALCITONIN: Procalcitonin: 0.6 ng/mL

## 2021-01-21 LAB — HIV ANTIBODY (ROUTINE TESTING W REFLEX): HIV Screen 4th Generation wRfx: NONREACTIVE

## 2021-01-21 MED ORDER — ENSURE ENLIVE PO LIQD
237.0000 mL | Freq: Three times a day (TID) | ORAL | Status: DC
  Administered 2021-01-21 – 2021-01-25 (×13): 237 mL via ORAL

## 2021-01-21 MED ORDER — ASCORBIC ACID 500 MG PO TABS
500.0000 mg | ORAL_TABLET | Freq: Every day | ORAL | Status: DC
  Administered 2021-01-22 – 2021-01-28 (×7): 500 mg via ORAL
  Filled 2021-01-21 (×7): qty 1

## 2021-01-21 NOTE — Evaluation (Signed)
Physical Therapy Evaluation Patient Details Name: John Reilly MRN: [id number] DOB: 07-21-1955 Today's Date: 01/21/2021  History of Present Illness  Pt is a 65 y.o. male presenting to hospital 9/12 with concerns of hypoglycemia (pt found unresponsive in his recliner) and AMS.  Pt admitted with aspiration PNA and sepsis, hypoglycemia, acute metabolic encephalopathy, and orthostatic hypotension.  Imaging showing acute appearing and mildly displaced R lateral 5th rib fx; also chronic B rib fx R 2nd, L 4th, 5th, and 8th.  PMH includes Parkinson's disease, DM, orthostatic hypotension, COPD, htn, peripheral vascular disease, T12 compression fx s/p kyphoplasty, s/p L hip fx  sx, humeral fx 09/2017, R ankle arthroplasty.  Clinical Impression  Prior to hospital admission, pt was ambulatory household distances with rollator (modified independent) and lives with his wife and 6 y.o. grandson in 1 level home with 1 STE B railings.  Currently pt is min assist semi-supine to sitting edge of bed, mod assist to stand from bed (min assist to stand from recliner) and min assist to ambulate a few feet with RW (pt noted with shuffling gait and forward flexed posture).  Generalized weakness noted.  Pt reports sleeping in lift recliner and uses lift function to stand (pt reports h/o falls d/t sliding out of lift recliner when using lift function).  Pt would benefit from skilled PT to address noted impairments and functional limitations (see below for any additional details).  Pt currently requiring assist for ambulation; pt would benefit from SNF but may be able to discharge home with improvement and 24/7 assist for functional mobility for safety.      Recommendations for follow up therapy are one component of a multi-disciplinary discharge planning process, led by the attending physician.  Recommendations may be updated based on patient status, additional functional criteria and insurance authorization.  Follow Up  Recommendations SNF (pending pt progress)    Equipment Recommendations  Other (comment) (pt has RW and 4ww at home already)    Recommendations for Other Services OT consult     Precautions / Restrictions Precautions Precautions: Fall Restrictions Weight Bearing Restrictions: No      Mobility  Bed Mobility Overal bed mobility: Needs Assistance Bed Mobility: Supine to Sit     Supine to sit: Min assist;HOB elevated     General bed mobility comments: assist for trunk (pt reports sleeping in lift recliner)    Transfers Overall transfer level: Needs assistance Equipment used: Rolling walker (2 wheeled) Transfers: Sit to/from Stand Sit to Stand: Mod assist;Min assist         General transfer comment: mod assist to stand from bed and min assist to stand from recliner; assist to initiate stand and control descent sitting  Ambulation/Gait Ambulation/Gait assistance: Min assist Gait Distance (Feet):  (3 feet bed to recliner with RW; pt able to take a couple shuffling steps forward and then backwards with RW) Assistive device: Rolling walker (2 wheeled)   Gait velocity: decreased   General Gait Details: shuffling gait; decreased B LE step length  Stairs            Wheelchair Mobility    Modified Rankin (Stroke Patients Only)       Balance Overall balance assessment: Needs assistance Sitting-balance support: Feet supported;Single extremity supported Sitting balance-[name]: Fair Sitting balance - Comments: steady static sitting   Standing balance support: Bilateral upper extremity supported Standing balance-[name]: Fair Standing balance comment: pt requiring B UE support for static standing balance  Pertinent Vitals/Pain Pain Assessment: No/denies pain Vitals (HR and O2 on room air) stable and WFL throughout treatment session.    Home Living Family/patient expects to be discharged to:: Private  residence Living Arrangements: Spouse/significant other Available Help at Discharge: Family Type of Home: House Home Access: Stairs to enter Entrance Stairs-Rails: Psychiatric nurse of Steps: 1 Home Layout: One level Home Equipment: Environmental consultant - 2 wheels;Bedside commode;Walker - 4 wheels;Grab bars - tub/shower Additional Comments: lives with his wife and 53 yo grandson    Prior Function Level of Independence: Independent with assistive device(s)         Comments: Ambulatory household distances with 4ww (sits down in seat if gets lightheaded/dizzy)     Hand Dominance        Extremity/Trunk Assessment   Upper Extremity Assessment Upper Extremity Assessment: Generalized weakness    Lower Extremity Assessment Lower Extremity Assessment: Generalized weakness    Cervical / Trunk Assessment Cervical / Trunk Assessment: Kyphotic  Communication   Communication: No difficulties  Cognition Arousal/Alertness: Awake/alert Behavior During Therapy: Flat affect (occasionally smiling) Overall Cognitive Status: Within Functional Limits for tasks assessed                                        General Comments  Nursing cleared pt for participation in physical therapy.  Pt agreeable to PT session.    Exercises  Transfers   Assessment/Plan    PT Assessment Patient needs continued PT services  PT Problem List Decreased strength;Decreased activity tolerance;Decreased balance;Decreased mobility       PT Treatment Interventions DME instruction;Gait training;Stair training;Functional mobility training;Therapeutic activities;Therapeutic exercise;Balance training;Patient/family education    PT Goals (Current goals can be found in the Care Plan section)  Acute Rehab PT Goals Patient Stated Goal: to improve mobility PT Goal Formulation: With patient Time For Goal Achievement: 02/04/21 Potential to Achieve Goals: Good    Frequency Min 2X/week    Barriers to discharge Decreased caregiver support Level of assist available    Co-evaluation               AM-PAC PT "6 Clicks" Mobility  Outcome Measure Help needed turning from your back to your side while in a flat bed without using bedrails?: None Help needed moving from lying on your back to sitting on the side of a flat bed without using bedrails?: A Little Help needed moving to and from a bed to a chair (including a wheelchair)?: A Little Help needed standing up from a chair using your arms (e.g., wheelchair or bedside chair)?: A Lot Help needed to walk in hospital room?: A Little Help needed climbing 3-5 steps with a railing? : A Lot 6 Click Score: 17    End of Session Equipment Utilized During Treatment: Gait belt Activity Tolerance: Patient tolerated treatment well Patient left: in chair;with call bell/phone within reach;with chair alarm set;with nursing/sitter in room Nurse Communication: Mobility status;Precautions PT Visit Diagnosis: Other abnormalities of gait and mobility (R26.89);Muscle weakness (generalized) (M62.81)    Time: [id number] PT Time Calculation (min) (ACUTE ONLY): 29 min   Charges:   PT Evaluation $PT Eval Low Complexity: 1 Low PT Treatments $Therapeutic Activity: 8-22 mins      Leitha Bleak, PT 01/21/21, 3:52 PM

## 2021-01-21 NOTE — Progress Notes (Signed)
Initial Nutrition Assessment  DOCUMENTATION CODES:  Severe malnutrition in context of social or environmental circumstances  INTERVENTION:  Liberalize diet to carb modified to allow for great meal selection choices and encourage PO intake.  Poor PO and malnutrition present Ensure Enlive po TID, each supplement provides 350 kcal and 20 grams of protein  Continue MVI, thiamine, and folic acid Add vitamin C '500mg'$  daily for presence of ecchymosis   NUTRITION DIAGNOSIS:  Severe Malnutrition related to social / environmental circumstances (etoh abuse) as evidenced by percent weight loss, severe muscle depletion, energy intake < 75% for > or equal to 3 months (14% x 7 months).  GOAL:  Patient will meet greater than or equal to 90% of their needs  MONITOR:  PO intake, Supplement acceptance, Weight trends  REASON FOR ASSESSMENT:  Malnutrition Screening Tool    ASSESSMENT:  65 y.o. male with medical history of HTN, type 1 DM, COPD, GERD, hx of colon cancer, PVD, Parkinson's disease, and EtOH abuse presented to ED from home with AMS. Extreme hypoglycemia on arrival of EMS  Imaging in ED suggestive of pneumonia. CIWA initiated.  Pt resting in bedside chair at the time of assessment. Napping, but woke easily.   Inquired about intake and pt states he ate a little breakfast, does not think he has had lunch yet. States his appetite has also been poor at home, continue to eat 3x/d but intake is much smaller. Reports this has been going on for at least 6 months. Also endorses a 50 lb wt loss in the last year. ~14% weight loss noted in the last 7 months which is severe.   Muscle and fat depletions seen throughout body. Discussed nutrition supplements as a way to augment intake. Pt reports he likes chocolate and strawberry. Will add between meals.   Average Meal Intake: 9/12-9/13: 25% intake x 2 recorded meals (10-40%)  Nutritionally Relevant Medications: Scheduled Meds:  folic acid  1 mg Oral  Daily   insulin aspart  0-6 Units Subcutaneous Q4H   insulin glargine-yfgn  4 Units Subcutaneous Daily   lipase/protease/amylase  24,000 Units Oral TID AC   multivitamin with minerals  1 tablet Oral Daily   pantoprazole  40 mg Oral Daily   thiamine  100 mg Oral Daily   Continuous Infusions:  cefTRIAXone (ROCEPHIN)  IV 1 g (01/21/21 1338)   dextrose 5 % and 0.9% NaCl 75 mL/hr at 01/21/21 0127   metronidazole 500 mg (01/21/21 1212)   PRN Meds: ondansetron  Labs Reviewed: Na 131 SBG ranges from 34-134 mg/dL over the last 24 hours HgbA1c 6.6% (03/21/20)  NUTRITION - FOCUSED PHYSICAL EXAM: Flowsheet Row Most Recent Value  Orbital Region Moderate depletion  Upper Arm Region No depletion  Thoracic and Lumbar Region Mild depletion  Buccal Region Moderate depletion  Temple Region Mild depletion  Clavicle Bone Region Mild depletion  Clavicle and Acromion Bone Region Moderate depletion  Scapular Bone Region Moderate depletion  Dorsal Hand Mild depletion  Patellar Region Severe depletion  Anterior Thigh Region Severe depletion  Posterior Calf Region Severe depletion  Edema (RD Assessment) None  Hair Reviewed  Eyes Reviewed  Mouth Reviewed  Skin Reviewed  Nails Reviewed   Diet Order:   Diet Order             Diet Carb Modified Fluid consistency: Thin; Room service appropriate? Yes  Diet effective now                   EDUCATION  NEEDS:  Education needs have been addressed  Skin:  Skin Assessment: Reviewed RN Assessment (ecchymosis)  Last BM:  prior to admission  Height:  Ht Readings from Last 1 Encounters:  01/21/21 '5\' 10"'$  (1.778 m)    Weight:  Wt Readings from Last 1 Encounters:  01/20/21 59.7 kg    Ideal Body Weight:  75.5 kg  BMI:  Body mass index is 18.88 kg/m.  Estimated Nutritional Needs:  Kcal:  1800-2000 kcal/d Protein:  90-100g/d Fluid:  1.8-2L/d   John Reilly, RD, LDN Clinical Dietitian Pager on [facility]

## 2021-01-21 NOTE — Progress Notes (Addendum)
PROGRESS NOTE    WILLETT LEFEBER  IEP:[id number] DOB: 20-Jan-1956 DOA: 01/20/2021 PCP: Adin Hector, MD   Brief Narrative: Taken from H&P.   Subjective:   Assessment & Plan:   Principal Problem:   Aspiration pneumonia ([place]) Active Problems:   Hypoglycemia   Type I diabetes mellitus (HCC)   Acute metabolic encephalopathy   COPD (chronic obstructive pulmonary disease) (HCC)   Hypertension   Orthostatic hypotension   Parkinson disease ([place])   Hypothermia   Alcohol abuse   Sepsis ([place])  Sepsis with aspiration pneumonia.  Patient met sepsis criteria with hypotension, tachycardia and tachypnea.  Blood pressure on softer side. Chest x-ray concerning for right lower lobe infiltrate, procalcitonin elevated at 0.66.  Patient is high risk for aspiration due to heavy drinking.  Received a dose of clindamycin in ED and was started on ceftriaxone with Flagyl.  Penicillin allergy noted in chart.  Preliminary blood cultures negative. -Continue with ceftriaxone and Flagyl -Continue with supportive care  Type 1 diabetes mellitus with hypoglycemia.  Patient with type 1 diabetes mellitus.  He was on insulin pump.  Poor p.o. intake. Per wife his recent insulin dose was decreased by his endocrinologist for the same reason. -Insulin pump was discontinued -Monitor with sensitive SSI as needed. -Continue with glargine 4 units daily.  Acute metabolic encephalopathy.  Resolved.  Most likely secondary to hypoglycemia.  CT head negative.  Orthostatic hypotension -Continue midodrine and Florinef   Parkinson disease (HCC) -Continue Sinemet  COPD (chronic obstructive pulmonary disease) ([place]).  No wheezing -Continue with Bronchodilators  Severe protein caloric malnutrition.  Secondary to poor p.o. intake.  Excessive alcohol use.  History of weight loss over the past year. Estimated body mass index is 18.88 kg/m as calculated from the following:   Height as of this encounter: '5\' 10"'  (1.778 m).    Weight as of this encounter: 59.7 kg.  -Dietitian consult -Discussed with wife and patient that he might need oncologic work-up as an outpatient if continue to lose weight.  Chronic pancreatic insufficiency. -Continue with Creon  Alcohol abuse. -Continue with CIWA protocol  Objective: Vitals:   01/21/21 0755 01/21/21 1206 01/21/21 1430 01/21/21 1531  BP: 112/61 (!) 112/57  116/65  Pulse: 93 81  93  Resp: '18 17  17  ' Temp: 98.5 F (36.9 C) 97.9 F (36.6 C)  97.9 F (36.6 C)  TempSrc: Oral     SpO2: 99% 100%  99%  Weight:      Height:   '5\' 10"'  (1.778 m)     Intake/Output Summary (Last 24 hours) at 01/21/2021 1653 Last data filed at 01/21/2021 1330 Gross per 24 hour  Intake 1517 ml  Output 600 ml  Net 917 ml   Filed Weights   01/20/21 1009 01/20/21 1703  Weight: 59 kg 59.7 kg    Examination:  General exam: Malnourished gentleman, appears calm and comfortable  Respiratory system: Clear to auscultation. Respiratory effort normal. Cardiovascular system: S1 & S2 heard, RRR.  Gastrointestinal system: Soft, nontender, nondistended, bowel sounds positive. Central nervous system: Alert and oriented. No focal neurological deficits.Symmetric 5 x 5 power. Extremities: No edema, no cyanosis, pulses intact and symmetrical.   DVT prophylaxis: Lovenox Code Status: Full Family Communication: Discussed with wife at bedside. Disposition Plan:  Status is: Inpatient  Remains inpatient appropriate because:Inpatient level of care appropriate due to severity of illness  Dispo: The patient is from: Home  Anticipated d/c is to: Home              Patient currently is not medically stable to d/c.   Difficult to place patient No              Level of care: Progressive Cardiac  All the records are reviewed and case discussed with Care Management/Social Worker. Management plans discussed with the patient, nursing and they are in agreement.  Consultants:   Procedures:   Antimicrobials:  Ceftriaxone Flagyl  Data Reviewed: I have personally reviewed following labs and imaging studies  CBC: Recent Labs  Lab 01/20/21 1013 01/21/21 0453  WBC 6.5 10.9*  NEUTROABS 5.1  --   HGB 11.5* 9.8*  HCT 33.5* 28.7*  MCV 96.8 99.0  PLT 482* 299*   Basic Metabolic Panel: Recent Labs  Lab 01/20/21 1013 01/21/21 0453  NA 130* 131*  K 3.9 3.6  CL 100 105  CO2 23 22  GLUCOSE 167* 81  BUN 11 12  CREATININE 0.83 0.87  CALCIUM 7.6* 7.4*   GFR: Estimated Creatinine Clearance: 70.5 mL/min (by C-G formula based on SCr of 0.87 mg/dL). Liver Function Tests: Recent Labs  Lab 01/20/21 1013  AST 36  ALT 15  ALKPHOS 183*  BILITOT 0.7  PROT 5.5*  ALBUMIN 2.2*   No results for input(s): LIPASE, AMYLASE in the last 168 hours. No results for input(s): AMMONIA in the last 168 hours. Coagulation Profile: No results for input(s): INR, PROTIME in the last 168 hours. Cardiac Enzymes: Recent Labs  Lab 01/20/21 1013  CKTOTAL 19*   BNP (last 3 results) No results for input(s): PROBNP in the last 8760 hours. HbA1C: No results for input(s): HGBA1C in the last 72 hours. CBG: Recent Labs  Lab 01/21/21 0432 01/21/21 0755 01/21/21 0834 01/21/21 1205 01/21/21 1620  GLUCAP 75 34* 134* 126* 142*   Lipid Profile: No results for input(s): CHOL, HDL, LDLCALC, TRIG, CHOLHDL, LDLDIRECT in the last 72 hours. Thyroid Function Tests: No results for input(s): TSH, T4TOTAL, FREET4, T3FREE, THYROIDAB in the last 72 hours. Anemia Panel: No results for input(s): VITAMINB12, FOLATE, FERRITIN, TIBC, IRON, RETICCTPCT in the last 72 hours. Sepsis Labs: Recent Labs  Lab 01/20/21 1248 01/20/21 1312 01/20/21 1504 01/21/21 0453  PROCALCITON  --  0.11  --  0.60  LATICACIDVEN 1.5  --  1.6  --     Recent Results (from the past 240 hour(s))  Resp Panel by RT-PCR (Flu A&B, Covid) Nasopharyngeal Swab     Status: None   Collection Time: 01/20/21 10:17 AM   Specimen:  Nasopharyngeal Swab; Nasopharyngeal(NP) swabs in vial transport medium  Result Value Ref Range Status   SARS Coronavirus 2 by RT PCR NEGATIVE NEGATIVE Final    Comment: (NOTE) SARS-CoV-2 target nucleic acids are NOT DETECTED.  The SARS-CoV-2 RNA is generally detectable in upper respiratory specimens during the acute phase of infection. The lowest concentration of SARS-CoV-2 viral copies this assay can detect is 138 copies/mL. A negative result does not preclude SARS-Cov-2 infection and should not be used as the sole basis for treatment or other patient management decisions. A negative result may occur with  improper specimen collection/handling, submission of specimen other than nasopharyngeal swab, presence of viral mutation(s) within the areas targeted by this assay, and inadequate number of viral copies(<138 copies/mL). A negative result must be combined with clinical observations, patient history, and epidemiological information. The expected result is Negative.  Fact Sheet for Patients:  EntrepreneurPulse.com.au  Fact Sheet  for Healthcare Providers:  IncredibleEmployment.be  This test is no t yet approved or cleared by the [place] and  has been authorized for detection and/or diagnosis of SARS-CoV-2 by FDA under an Emergency Use Authorization (EUA). This EUA will remain  in effect (meaning this test can be used) for the duration of the COVID-19 declaration under Section 564(b)(1) of the Act, 21 U.S.C.section 360bbb-3(b)(1), unless the authorization is terminated  or revoked sooner.       Influenza A by PCR NEGATIVE NEGATIVE Final   Influenza B by PCR NEGATIVE NEGATIVE Final    Comment: (NOTE) The Xpert Xpress SARS-CoV-2/FLU/RSV plus assay is intended as an aid in the diagnosis of influenza from Nasopharyngeal swab specimens and should not be used as a sole basis for treatment. Nasal washings and aspirates are unacceptable for  Xpert Xpress SARS-CoV-2/FLU/RSV testing.  Fact Sheet for Patients: EntrepreneurPulse.com.au  Fact Sheet for Healthcare Providers: IncredibleEmployment.be  This test is not yet approved or cleared by the [place] FDA and has been authorized for detection and/or diagnosis of SARS-CoV-2 by FDA under an Emergency Use Authorization (EUA). This EUA will remain in effect (meaning this test can be used) for the duration of the COVID-19 declaration under Section 564(b)(1) of the Act, 21 U.S.C. section 360bbb-3(b)(1), unless the authorization is terminated or revoked.  Performed at [facility], [address] [place], [place] [address]   Blood culture (routine x 2)     Status: None (Preliminary result)   Collection Time: 01/20/21 12:50 PM   Specimen: BLOOD  Result Value Ref Range Status   Specimen Description BLOOD BLOOD LEFT FOREARM  Final   Special Requests   Final    BOTTLES DRAWN AEROBIC AND ANAEROBIC Blood Culture adequate volume   Culture   Final    NO GROWTH < 24 HOURS Performed at [facility], [address], [place], [place] [address]    Report Status PENDING  Incomplete  Blood culture (routine x 2)     Status: None (Preliminary result)   Collection Time: 01/20/21  1:12 PM   Specimen: BLOOD  Result Value Ref Range Status   Specimen Description BLOOD RIGHT ANTECUBITAL  Final   Special Requests   Final    BOTTLES DRAWN AEROBIC AND ANAEROBIC Blood Culture adequate volume   Culture   Final    NO GROWTH < 24 HOURS Performed at [facility], [address], [place], [place] [address]    Report Status PENDING  Incomplete     Radiology Studies: CT HEAD WO CONTRAST (5MM)  Result Date: 01/20/2021 CLINICAL DATA:  Head trauma, minor EXAM: CT HEAD WITHOUT CONTRAST TECHNIQUE: Contiguous axial images were obtained from the base of the skull through the vertex without intravenous contrast. COMPARISON:  07/19/2020  FINDINGS: Brain: There is no acute intracranial hemorrhage, mass effect, or edema. Gray-white differentiation is preserved. There is no extra-axial fluid collection. Ventricles and sulci are stable in size and configuration. Patchy low-attenuation in the supratentorial white matter is nonspecific but probably reflects stable chronic microvascular ischemic changes. Vascular: There is mild atherosclerotic calcification at the skull base. Skull: Calvarium is unremarkable. Sinuses/Orbits: No acute finding. Other: None. IMPRESSION: No evidence of acute intracranial injury. Electronically Signed   By: Macy Mis M.D.   On: 01/20/2021 14:26   CT Angio Chest PE W and/or Wo Contrast  Result Date: 01/20/2021 CLINICAL DATA:  Suspected pulmonary embolism in a 65 year old male with hypoglycemia and unresponsiveness. EXAM: CT ANGIOGRAPHY CHEST WITH  CONTRAST TECHNIQUE: Multidetector CT imaging of the chest was performed using the standard protocol during bolus administration of intravenous contrast. Multiplanar CT image reconstructions and MIPs were obtained to evaluate the vascular anatomy. CONTRAST:  70m OMNIPAQUE IOHEXOL 350 MG/ML SOLN COMPARISON:  Chest x-ray from January 20, 2021. No prior cross-sectional imaging of the chest for comparison. FINDINGS: Cardiovascular: Normal caliber of the thoracic aorta. Normal heart size without substantial pericardial effusion. Adequate opacification of pulmonary vasculature. No sign of pulmonary embolism. Mediastinum/Nodes: No adenopathy in the chest. Esophagus grossly normal. Lungs/Pleura: Small to moderate bilateral pleural effusions RIGHT greater than LEFT layering dependently in the bilateral chest. Ground-glass and consolidative changes also RIGHT greater than LEFT. Filling defects in RIGHT lower lobe bronchi. Material in the trachea as well. LEFT lower lobe bronchi are patent. No pneumothorax. Upper Abdomen: Incidental imaging of upper abdominal contents without acute  process very limited assessment. Musculoskeletal: Partially imaged changes of cement augmentation at the T12 vertebral level. T5 with approximally 20-30% loss of height, some surrounding degenerative change. No surrounding stranding. Mild loss of height at T8 also with some surrounding degenerative change. Findings similar to prior MRI of the spine. Evidence of prior LEFT humeral fracture. Review of the MIP images confirms the above findings. IMPRESSION: Negative for pulmonary embolism. Findings of suspected aspiration related pneumonia in the RIGHT lower lobe, airspace disease seen to a lesser extent in the LEFT chest and associated with bilateral effusions. Suggest follow-up to ensure resolution. Signs of thoracic spine compression fractures as on recent spinal Aortic Atherosclerosis (ICD10-I70.0).  MRI. Electronically Signed   By: GZetta BillsM.D.   On: 01/20/2021 14:31   CT Cervical Spine Wo Contrast  Result Date: 01/20/2021 CLINICAL DATA:  Neck trauma EXAM: CT CERVICAL SPINE WITHOUT CONTRAST TECHNIQUE: Multidetector CT imaging of the cervical spine was performed without intravenous contrast. Multiplanar CT image reconstructions were also generated. COMPARISON:  07/19/2020 FINDINGS: Alignment: Stable Skull base and vertebrae: Stable vertebral body heights. No acute fracture. Soft tissues and spinal canal: No prevertebral fluid or swelling. No visible canal hematoma. Disc levels: Multilevel degenerative changes are present including disc space narrowing, endplate osteophytes, and facet and uncovertebral hypertrophy. Upper chest: No apical lung mass. Other: None IMPRESSION: No acute cervical spine fracture. Electronically Signed   By: PMacy MisM.D.   On: 01/20/2021 14:32   DG Chest Portable 1 View  Result Date: 01/20/2021 CLINICAL DATA:  65year old male with hypoglycemia.  Cough. EXAM: PORTABLE CHEST 1 VIEW COMPARISON:  Portable chest 03/23/2020 and earlier. FINDINGS: Portable AP upright view at  1028 hours. Chronic proximal left humerus deformity. Improved lung volumes from last year. Resolved streaky left lung base opacity, but similar streaky and now confluent right lung base opacity. Mediastinal contours remain normal. Elsewhere the lungs appear clear. No pneumothorax or pleural effusion. However, a mildly displaced right lateral 5th rib fracture is new. But other chronic bilateral rib fractures are redemonstrated (right 2nd, left 4th, 5th, 8th). IMPRESSION: 1. Confluent right lung base opacity, with similar bibasilar opacity last year but resolved on the left. Consider acute aspiration and/or pneumonia in this setting. 2. Acute appearing and mildly displaced right lateral 5th rib fracture. There are other chronic bilateral rib fractures. Electronically Signed   By: HGenevie AnnM.D.   On: 01/20/2021 10:58    Scheduled Meds:  [START ON 01/22/2021] vitamin C  500 mg Oral Daily   carbidopa-levodopa  1 tablet Oral QHS   carbidopa-levodopa  1 tablet Oral QID   enoxaparin (  LOVENOX) injection  40 mg Subcutaneous Q24H   feeding supplement  237 mL Oral TID BM   fludrocortisone  200 mcg Oral Daily   folic acid  1 mg Oral Daily   insulin aspart  0-6 Units Subcutaneous Q4H   insulin glargine-yfgn  4 Units Subcutaneous Daily   lipase/protease/amylase  24,000 Units Oral TID AC   LORazepam  0-4 mg Intravenous Q6H   Followed by   Derrill Memo ON 01/22/2021] LORazepam  0-4 mg Intravenous Q12H   midodrine  2.5 mg Oral BID   multivitamin with minerals  1 tablet Oral Daily   pantoprazole  40 mg Oral Daily   rivastigmine  1.5 mg Oral BID   sodium chloride flush  3 mL Intravenous Q12H   thiamine  100 mg Oral Daily   Or   thiamine  100 mg Intravenous Daily   Continuous Infusions:  cefTRIAXone (ROCEPHIN)  IV 1 g (01/21/21 1338)   dextrose 5 % and 0.9% NaCl 75 mL/hr at 01/21/21 0127   metronidazole 500 mg (01/21/21 1212)     LOS: 1 day   Time spent: 40 minutes. More than 50% of the time was spent in  counseling/coordination of care  Lorella Nimrod, MD [facility]  If 7PM-7AM, please contact night-coverage [email or web address]  01/21/2021, 4:53 PM   This record has been created using Systems analyst. Errors have been sought and corrected,but may not always be located. Such creation errors do not reflect on the standard of care.

## 2021-01-21 NOTE — Progress Notes (Signed)
Inpatient Diabetes Program Recommendations  AACE/ADA: New Consensus Statement on Inpatient Glycemic Control (2015)  Target Ranges:  Prepandial:   less than 140 mg/dL      Peak postprandial:   less than 180 mg/dL (1-2 hours)      Critically ill patients:  140 - 180 mg/dL   Lab Results  Component Value Date   GLUCAP 134 (H) 01/21/2021   HGBA1C 6.6 (H) 03/21/2020    Review of Glycemic Control Results for John Reilly, John Reilly (MRN [id number]) as of 01/21/2021 09:04  Ref. Range 01/20/2021 17:22 01/20/2021 20:45 01/20/2021 23:31 01/21/2021 03:43 01/21/2021 04:02 01/21/2021 04:32 01/21/2021 07:55 01/21/2021 08:34  Glucose-Capillary Latest Ref Range: 70 - 99 mg/dL 115 (H) 214 (H)  2 units of Novolog given 170 (H)  1 unit of Novolog given 58 (L) 69 (L) 75 34 (LL) 134 (H)   Diabetes history: Type 1 DM Insulin pump: Manual mode basal rates TDD basal: 8.125 units Bolus settings I/C: 25 ISF: 85 Target Glucose: 120 Active insulin time: 5 hours Current orders for Inpatient glycemic control:  Semglee 4 units Novolog 0-6 units Q4  Inpatient Diabetes Program Recommendations:    Hypoglycemia 34 this am. Insulin possibly stacked, pt very sensitive. 1 unit drops glucose at least 85 points per Endocrinology.  - reduce Novolog Correction to custom scale starting at 200 (current scale starts at 151) 4 x/day  - If diet ordered consider ACHS coverage not Q4  Thanks,  Tama Headings RN, MSN, BC-ADM Inpatient Diabetes Coordinator Team Pager [phone] (8a-5p)

## 2021-01-21 NOTE — Progress Notes (Signed)
Personal insulin pump sent home with spouse.

## 2021-01-22 DIAGNOSIS — R5381 Other malaise: Secondary | ICD-10-CM

## 2021-01-22 DIAGNOSIS — E871 Hypo-osmolality and hyponatremia: Secondary | ICD-10-CM

## 2021-01-22 DIAGNOSIS — T383X5A Adverse effect of insulin and oral hypoglycemic [antidiabetic] drugs, initial encounter: Secondary | ICD-10-CM

## 2021-01-22 DIAGNOSIS — R6520 Severe sepsis without septic shock: Secondary | ICD-10-CM

## 2021-01-22 DIAGNOSIS — E10649 Type 1 diabetes mellitus with hypoglycemia without coma: Secondary | ICD-10-CM

## 2021-01-22 DIAGNOSIS — E43 Unspecified severe protein-calorie malnutrition: Secondary | ICD-10-CM | POA: Insufficient documentation

## 2021-01-22 DIAGNOSIS — G8929 Other chronic pain: Secondary | ICD-10-CM

## 2021-01-22 DIAGNOSIS — E160 Drug-induced hypoglycemia without coma: Secondary | ICD-10-CM

## 2021-01-22 DIAGNOSIS — E876 Hypokalemia: Secondary | ICD-10-CM

## 2021-01-22 DIAGNOSIS — M546 Pain in thoracic spine: Secondary | ICD-10-CM

## 2021-01-22 LAB — CBC
HCT: 25.7 % — ABNORMAL LOW (ref 39.0–52.0)
Hemoglobin: 8.7 g/dL — ABNORMAL LOW (ref 13.0–17.0)
MCH: 32.8 pg (ref 26.0–34.0)
MCHC: 33.9 g/dL (ref 30.0–36.0)
MCV: 97 fL (ref 80.0–100.0)
Platelets: 354 10*3/uL (ref 150–400)
RBC: 2.65 MIL/uL — ABNORMAL LOW (ref 4.22–5.81)
RDW: 14.6 % (ref 11.5–15.5)
WBC: 6.7 10*3/uL (ref 4.0–10.5)
nRBC: 0 % (ref 0.0–0.2)

## 2021-01-22 LAB — PROCALCITONIN: Procalcitonin: 0.41 ng/mL

## 2021-01-22 LAB — BASIC METABOLIC PANEL
Anion gap: 4 — ABNORMAL LOW (ref 5–15)
BUN: 10 mg/dL (ref 8–23)
CO2: 23 mmol/L (ref 22–32)
Calcium: 7 mg/dL — ABNORMAL LOW (ref 8.9–10.3)
Chloride: 104 mmol/L (ref 98–111)
Creatinine, Ser: 0.75 mg/dL (ref 0.61–1.24)
GFR, Estimated: >60 mL/min (ref >60)
Glucose, Bld: 171 mg/dL — ABNORMAL HIGH (ref 70–99)
Potassium: 3.1 mmol/L — ABNORMAL LOW (ref 3.5–5.1)
Sodium: 131 mmol/L — ABNORMAL LOW (ref 135–145)

## 2021-01-22 LAB — GLUCOSE, CAPILLARY
Glucose-Capillary: 146 mg/dL — ABNORMAL HIGH (ref 70–99)
Glucose-Capillary: 158 mg/dL — ABNORMAL HIGH (ref 70–99)
Glucose-Capillary: 168 mg/dL — ABNORMAL HIGH (ref 70–99)
Glucose-Capillary: 184 mg/dL — ABNORMAL HIGH (ref 70–99)
Glucose-Capillary: 265 mg/dL — ABNORMAL HIGH (ref 70–99)
Glucose-Capillary: 71 mg/dL (ref 70–99)

## 2021-01-22 LAB — MAGNESIUM: Magnesium: 1.3 mg/dL — ABNORMAL LOW (ref 1.7–2.4)

## 2021-01-22 LAB — ALBUMIN: Albumin: 1.4 g/dL — ABNORMAL LOW (ref 3.5–5.0)

## 2021-01-22 LAB — LEGIONELLA PNEUMOPHILA SEROGP 1 UR AG: L. pneumophila Serogp 1 Ur Ag: NEGATIVE

## 2021-01-22 MED ORDER — MAGNESIUM SULFATE 4 GM/100ML IV SOLN
4.0000 g | Freq: Once | INTRAVENOUS | Status: AC
  Administered 2021-01-22: 4 g via INTRAVENOUS
  Filled 2021-01-22: qty 100

## 2021-01-22 MED ORDER — MAGNESIUM SULFATE 2 GM/50ML IV SOLN: 2.0000 g | Freq: Once | INTRAVENOUS | Status: DC

## 2021-01-22 MED ORDER — POTASSIUM CHLORIDE CRYS ER 20 MEQ PO TBCR
40.0000 meq | EXTENDED_RELEASE_TABLET | ORAL | Status: AC
Start: 2021-01-22 — End: 2021-01-22
  Administered 2021-01-22 (×2): 40 meq via ORAL
  Filled 2021-01-22 (×2): qty 4

## 2021-01-22 NOTE — TOC Initial Note (Signed)
Transition of Care [facility]) - Initial/Assessment Note    Patient Details  Name: John Reilly MRN: [id number] Date of Birth: 26-Oct-1955  Transition [facility]) CM/SW Contact:    Alberteen Sam, LCSW Phone Number: 01/22/2021, 8:58 AM  Clinical Narrative:                  CSW spoke with patient's spouse [name] to update on SNF recommendation from PT evaluation.   [name] reports they are agreeable to this recommendation and would like CSW to send out referrals to get bed offers.   CSW will follow up with [name] on bed offers once obtained.   Expected Discharge Plan: Skilled Nursing Facility Barriers to Discharge: Continued Medical Work up   Patient Goals and CMS Choice Patient states their goals for this hospitalization and ongoing recovery are:: to go to snf CMS Medicare.gov Compare Post Acute Care list provided to:: Patient Represenative (must comment) (spouse [name]) Choice offered to / list presented to : Spouse  Expected Discharge Plan and Services Expected Discharge Plan: [place]       Living arrangements for the past 2 months: Single Family Home                                      Prior Living Arrangements/Services Living arrangements for the past 2 months: Single Family Home   Patient language and need for interpreter reviewed:: Yes        Need for Family Participation in Patient Care: Yes (Comment) Care giver support system in place?: Yes (comment)   Criminal Activity/Legal Involvement Pertinent to Current Situation/Hospitalization: No - Comment as needed  Activities of Daily Living Home Assistive Devices/Equipment: Walker (specify type) ADL Screening (condition at time of admission) Patient's cognitive ability adequate to safely complete daily activities?: Yes Is the patient deaf or have difficulty hearing?: No Does the patient have difficulty seeing, even when wearing glasses/contacts?: No Does the patient have difficulty concentrating,  remembering, or making decisions?: No Patient able to express need for assistance with ADLs?: Yes Does the patient have difficulty dressing or bathing?: Yes Independently performs ADLs?: No Communication: Independent Dressing (OT): Needs assistance Is this a change from baseline?: Pre-admission baseline Grooming: Needs assistance Is this a change from baseline?: Pre-admission baseline Feeding: Needs assistance Is this a change from baseline?: Pre-admission baseline Bathing: Needs assistance Is this a change from baseline?: Pre-admission baseline Toileting: Needs assistance Is this a change from baseline?: Pre-admission baseline In/Out Bed: Needs assistance Is this a change from baseline?: Pre-admission baseline Walks in Home: Needs assistance Is this a change from baseline?: Pre-admission baseline Does the patient have difficulty walking or climbing stairs?: Yes Weakness of Legs: Both Weakness of Arms/Hands: Both  Permission Sought/Granted Permission sought to share information with : Case Manager, Customer service manager, Family Supports    Share Information with NAME: [name]  Permission granted to share info w AGENCY: SNFs  Permission granted to share info w Relationship: spouse  Permission granted to share info w Contact Information: [phone]  Emotional Assessment Appearance:: Appears stated age            Admission diagnosis:  Hypoglycemia [E16.2] Sepsis, due to unspecified organism, unspecified whether acute organ dysfunction present [facility]) [A41.9] Aspiration pneumonia ([place]) [J69.0] Patient Active Problem List   Diagnosis Date Noted   Hypoglycemia 01/20/2021   Type I diabetes mellitus ([place]) XX123456   Acute metabolic  encephalopathy 01/20/2021   COPD (chronic obstructive pulmonary disease) (HCC)    Hypertension    Orthostatic hypotension    Parkinson disease ([place])    Hypothermia    Aspiration pneumonia (HCC)    Alcohol abuse    Sepsis ([place])     AMS (altered mental status) 03/21/2020   Hypoglycemia due to insulin 03/21/2020   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 [address] Phone: [phone] Fax: [phone]  [facility], [address] [place] [address] Phone: [phone] Fax: [phone]  [facility] [address] [place] [address] Phone: [phone] Fax: [phone]  [facility] [place], [place] [address] [place] [address] Phone: [phone] Fax: [phone]     Social Determinants of Health (SDOH) Interventions    Readmission Risk Interventions No flowsheet data found.

## 2021-01-22 NOTE — NC FL2 (Signed)
[place] LEVEL OF CARE SCREENING TOOL     IDENTIFICATION  Patient Name: John Reilly Birthdate: 1955-11-30 Sex: male Admission Date (Current Location): 01/20/2021  [facility] and [place] Number:  Engineering geologist and Address:  [facility], [address], [place], [place] [address]      Provider Number: [id number]  Attending Physician Name and Address:  Mercy Riding, MD  Relative Name and Phone Number:  [name] (spouse) [phone]    Current Level of Care: Hospital Recommended Level of Care: [place] Prior Approval Number:    Date Approved/Denied:   PASRR Number: [id number]  Discharge Plan: SNF    Current Diagnoses: Patient Active Problem List   Diagnosis Date Noted   Hypoglycemia 01/20/2021   Type I diabetes mellitus ([place]) XX123456   Acute metabolic encephalopathy XX123456   COPD (chronic obstructive pulmonary disease) ([place])    Hypertension    Orthostatic hypotension    Parkinson disease ([place])    Hypothermia    Aspiration pneumonia ([place])    Alcohol abuse    Sepsis ([place])    AMS (altered mental status) 03/21/2020   Hypoglycemia due to insulin 03/21/2020   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   Pain due to onychomycosis of toenails of both feet 09/07/2019   Dermatitis 09/07/2019   S/p left hip fracture 08/10/2018   Altered mental status    Closed fracture of left hip ([place])    Fall    DKA (diabetic ketoacidoses) 10/13/2017   Malnutrition of moderate degree 10/04/2017   Humeral fracture 10/03/2017   Ulcer of toe ([place]) 04/27/2015   Hyponatremia 03/15/2015    Orientation RESPIRATION BLADDER Height & Weight     Self, Time, Situation, Place  Normal Incontinent, External catheter Weight: 128 lb 1.4 oz (58.1 kg) Height:  '5\' 10"'$  (177.8 cm) (per chart review)  BEHAVIORAL SYMPTOMS/MOOD NEUROLOGICAL BOWEL NUTRITION STATUS      Incontinent Diet (see discharge summary)   AMBULATORY STATUS COMMUNICATION OF NEEDS Skin   Extensive Assist Verbally Normal                       Personal Care Assistance Level of Assistance  Bathing, Feeding, Total care, Dressing Bathing Assistance: Maximum assistance Feeding assistance: Independent Dressing Assistance: Maximum assistance Total Care Assistance: Maximum assistance   Functional Limitations Info  Sight, Speech, Hearing Sight Info: Adequate Hearing Info: Adequate Speech Info: Adequate    SPECIAL CARE FACTORS FREQUENCY  PT (By licensed PT), OT (By licensed OT)     PT Frequency: min 4x weekly OT Frequency: min 4x weekly            Contractures Contractures Info: Not present    Additional Factors Info  Code Status, Allergies Code Status Info: full Allergies Info: Penicillins, sulfa antibiotics           Current Medications (01/22/2021):  This is the current hospital active medication list Current Facility-Administered Medications  Medication Dose Route Frequency Provider Last Rate Last Admin   acetaminophen (TYLENOL) tablet 650 mg  650 mg Oral Q6H PRN Ivor Costa, MD       albuterol (PROVENTIL) (2.5 MG/3ML) 0.083% nebulizer solution 3 mL  3 mL Inhalation Q4H PRN Ivor Costa, MD       ascorbic acid (VITAMIN C) tablet 500 mg  500 mg Oral Daily Lorella Nimrod, MD   500 mg at 01/22/21 [id number]   carbidopa-levodopa (SINEMET CR) 50-200 MG per tablet  controlled release 1 tablet  1 tablet Oral QHS Ivor Costa, MD   1 tablet at 01/21/21 2125   carbidopa-levodopa (SINEMET IR) 25-250 MG per tablet immediate release 1 tablet  1 tablet Oral QID Ivor Costa, MD   1 tablet at 01/22/21 0820   cefTRIAXone (ROCEPHIN) 1 g in sodium chloride 0.9 % 100 mL IVPB  1 g Intravenous Q24H Ivor Costa, MD   Stopped at 01/21/21 1408   dextromethorphan-guaiFENesin ([place] DM) 30-600 MG per 12 hr tablet 1 tablet  1 tablet Oral BID PRN Ivor Costa, MD       dextrose 5 %-0.9 % sodium chloride infusion   Intravenous Continuous Ivor Costa, MD 75 mL/hr at 01/22/21 0600 Infusion Verify at 01/22/21 0600   dextrose 50 % solution 50 mL  50 mL Intravenous PRN Ivor Costa, MD   50 mL at 01/21/21 0801   enoxaparin (LOVENOX) injection 40 mg  40 mg Subcutaneous Q24H Ivor Costa, MD   40 mg at 01/21/21 2028   feeding supplement (ENSURE ENLIVE / ENSURE PLUS) liquid 237 mL  237 mL Oral TID BM Lorella Nimrod, MD   237 mL at 01/22/21 0832   fludrocortisone (FLORINEF) tablet 200 mcg  200 mcg Oral Daily Ivor Costa, MD   200 mcg at [id number]   folic acid (FOLVITE) tablet 1 mg  1 mg Oral Daily Ivor Costa, MD   1 mg at 01/22/21 [id number]   insulin aspart (novoLOG) injection 0-6 Units  0-6 Units Subcutaneous Q4H Ivor Costa, MD   1 Units at 01/20/21 2340   insulin glargine-yfgn (SEMGLEE) injection 4 Units  4 Units Subcutaneous Daily Ivor Costa, MD   4 Units at 01/22/21 0820   lipase/protease/amylase (CREON) capsule 24,000 Units  24,000 Units Oral TID Renella Cunas, MD   24,000 Units at 01/22/21 0820   LORazepam (ATIVAN) injection 0-4 mg  0-4 mg Intravenous Q6H Ivor Costa, MD       Followed by   LORazepam (ATIVAN) injection 0-4 mg  0-4 mg Intravenous Q12H Ivor Costa, MD       LORazepam (ATIVAN) tablet 1-4 mg  1-4 mg Oral Q1H PRN Ivor Costa, MD   1 mg at 01/21/21 2027   Or   LORazepam (ATIVAN) injection 1-4 mg  1-4 mg Intravenous Q1H PRN Ivor Costa, MD       metroNIDAZOLE (FLAGYL) IVPB 500 mg  500 mg Intravenous Q8H Ivor Costa, MD   Stopped at 01/22/21 0257   midodrine (PROAMATINE) tablet 2.5 mg  2.5 mg Oral BID Ivor Costa, MD   2.5 mg at 01/22/21 [id number]   multivitamin with minerals tablet 1 tablet  1 tablet Oral Daily Ivor Costa, MD   1 tablet at 01/22/21 0822   ondansetron (ZOFRAN) injection 4 mg  4 mg Intravenous Q8H PRN Ivor Costa, MD       pantoprazole (PROTONIX) EC tablet 40 mg  40 mg Oral Daily Ivor Costa, MD   40 mg at 01/22/21 [id number]   potassium chloride SA (KLOR-CON) CR tablet 40 mEq  40 mEq Oral Q4H Wendee Beavers T, MD   40 mEq at 01/22/21 0833    rivastigmine (EXELON) capsule 1.5 mg  1.5 mg Oral BID Ivor Costa, MD   1.5 mg at 01/22/21 0820   sodium chloride flush (NS) 0.9 % injection 3 mL  3 mL Intravenous Q12H Ivor Costa, MD   3 mL at 01/22/21 [id number]   thiamine tablet 100 mg  100 mg Oral Daily Ivor Costa,  MD   100 mg at 01/22/21 [id number]   Or   thiamine (B-1) injection 100 mg  100 mg Intravenous Daily Ivor Costa, MD         Discharge Medications: Please see discharge summary for a list of discharge medications.  Relevant Imaging Results:  Relevant Lab Results:   Additional Information SSN: [id number]  Alberteen Sam, LCSW

## 2021-01-22 NOTE — Progress Notes (Signed)
PROGRESS NOTE  John Reilly:[id number] DOB: [birth year]   PCP: Adin Hector, MD  Patient is from: Home.  Lives with his wife.  Uses walker at baseline.  DOA: 01/20/2021 LOS: 2  Chief complaints:  Chief Complaint  Patient presents with   Hypoglycemia     Brief Narrative / Interim history: 65 year old M with PMH of DM-1 on insulin pump, Parkinson disease, COPD not on oxygen, HTN, GERD, EtOH abuse, pancreatic insufficiency and debility brought to ED by EMS due to altered mental status and hypoglycemia to 20.  Reportedly drinks about 42 ounce of beer daily.  Hypoglycemia resolved after dextrose and glucagon by EMS.  Per diabetic coordinator, hypoglycemia felt to be he is expired Dexcom sensor.  He was admitted for acute metabolic encephalopathy in the setting of hypoglycemia and possible aspiration pneumonia.  He was also orthostatic.   Subjective: Seen and examined earlier this morning.  No major events overnight of this morning.  No complaints other than chronic back pain.  He says he had T12 vertebral fracture for which he had kyphoplasty.  He denies chest pain, dyspnea, GI or UTI symptoms.  Objective: Vitals:   01/22/21 0500 01/22/21 0600 01/22/21 0727 01/22/21 1139  BP:   130/70 117/65  Pulse:   (!) 59 82  Resp: '20 20 17 17  ' Temp:   97.8 F (36.6 C) 98.7 F (37.1 C)  TempSrc:      SpO2:   100% 98%  Weight:      Height:        Intake/Output Summary (Last 24 hours) at 01/22/2021 1419 Last data filed at 01/22/2021 1346 Gross per 24 hour  Intake 3174.28 ml  Output 1700 ml  Net 1474.28 ml   Filed Weights   01/20/21 1009 01/20/21 1703 01/22/21 0441  Weight: 59 kg 59.7 kg 58.1 kg    Examination:  GENERAL: No apparent distress.  Nontoxic. HEENT: MMM.  Vision and hearing grossly intact.  NECK: Supple.  No apparent JVD.  RESP:  No IWOB.  Fair aeration bilaterally. CVS:  RRR. Heart sounds normal.  ABD/GI/GU: BS+. Abd soft, NTND.  MSK/EXT:  Moves extremities. No  apparent deformity. No edema.  SKIN: no apparent skin lesion or wound NEURO: Awake, alert and oriented x4 except months.  No apparent focal neuro deficit. PSYCH: Calm. Normal affect.   Procedures:  None  Microbiology summarized: [id number] and influenza PCR nonreactive. Blood cultures NGTD  Assessment & Plan: Severe sepsis in the setting of aspiration pneumonia: POA.  Met criteria with tachycardia, tachypnea, hypotension and encephalopathy.  CXR concerning for RLL infiltrate.  Procalcitonin elevated.  Sepsis physiology resolved.  Blood cultures NGTD. -Continue ceftriaxone and Flagyl-allergic to penicillin -Incentive spirometry, OOB, PT/OT   Uncontrolled DM-1 with hypoglycemia: On insulin pump at home.  Was hypoglycemic to 20 at home when EMS picked him up.  Reportedly, patient is very sensitive to insulin.  Last A1c 6.6% in 03/2020. Recent Labs  Lab 01/21/21 2235 01/21/21 2347 01/22/21 0453 01/22/21 0728 01/22/21 1143  GLUCAP 94 105* 71 168* 265*  -Continue basal insulin 4 units nightly -Continue SSI-ESRD scale -Check hemoglobin A1c in the morning   Acute metabolic encephalopathy: Likely due to the above.  No focal neuro deficit.  CT head negative. -Treat treatable causes -Reorientation and delirium precautions.   Orthostatic hypotension: On Florinef and midodrine at home. -Continue midodrine and Florinef.  May consider increasing midodrine -Trial of TED hose   Parkinson disease ([place]) -Continue Sinemet   COPD (  chronic obstructive pulmonary disease: No respiratory symptoms. -Continue with Bronchodilators  Chronic pancreatic insufficiency -Continue with Creon  Hyponatremia/hypokalemia/hypomagnesemia-due to EtOH.  Florinef could contribute to hypokalemia as well -Continue Florinef for hyponatremia -K-Dur 40 mill equivalent x2 for hypokalemia -IV magnesium sulfate 4 g x 1  Hypocalcemia: Ca 7.0, corrects to normal for albumin of 1.4.  Normocytic anemia: Baseline Hgb  9-11.  Drop in Hgb likely dilutional.  No evidence of GI bleed. Recent Labs    03/21/20 1609 03/22/20 0122 03/23/20 0504 03/24/20 0513 11/28/20 1706 01/20/21 1013 01/21/21 0453 01/22/21 0721  HGB 12.2* 10.7* 9.5* 9.7* 11.0* 11.5* 9.8* 8.7*  -Check anemia panel   Alcohol abuse: Reportedly drinks about 42 ounce of beers a day.  No withdrawal symptoms yet. -Encourage alcohol cessation -Continue with CIWA protocol -Continue multivitamin, thiamine and folic acid  Physical deconditioning/debility -PT/OT  Chronic back pain/history of thoracic compression fracture -As needed Tylenol  Severe protein calorie malnutrition: As evidenced by low BMI and significant muscle mass and subcu fat loss. Likely due to diabetes and pancreatic insufficiency. Body mass index is 18.38 kg/m. Nutrition Problem: Severe Malnutrition Etiology: social / environmental circumstances (etoh abuse) Signs/Symptoms: percent weight loss, severe muscle depletion, energy intake < 75% for > or equal to 3 months (14% x 7 months) Percent weight loss: 14 % Interventions: Refer to RD note for recommendations   DVT prophylaxis:  enoxaparin (LOVENOX) injection 40 mg Start: 01/20/21 2200  Code Status: Full code Family Communication: Updated patient's wife over the phone. Level of care: Progressive Cardiac Status is: Inpatient  Remains inpatient appropriate because:Persistent severe electrolyte disturbances, Unsafe d/c plan, IV treatments appropriate due to intensity of illness or inability to take PO, and Inpatient level of care appropriate due to severity of illness  Dispo: The patient is from: Home              Anticipated d/c is to: SNF              Patient currently is not medically stable to d/c.   Difficult to place patient No       Consultants:  None   Sch Meds:  Scheduled Meds:  vitamin C  500 mg Oral Daily   carbidopa-levodopa  1 tablet Oral QHS   carbidopa-levodopa  1 tablet Oral QID    enoxaparin (LOVENOX) injection  40 mg Subcutaneous Q24H   feeding supplement  237 mL Oral TID BM   fludrocortisone  200 mcg Oral Daily   folic acid  1 mg Oral Daily   insulin aspart  0-6 Units Subcutaneous Q4H   insulin glargine-yfgn  4 Units Subcutaneous Daily   lipase/protease/amylase  24,000 Units Oral TID AC   midodrine  2.5 mg Oral BID   multivitamin with minerals  1 tablet Oral Daily   pantoprazole  40 mg Oral Daily   rivastigmine  1.5 mg Oral BID   sodium chloride flush  3 mL Intravenous Q12H   thiamine  100 mg Oral Daily   Or   thiamine  100 mg Intravenous Daily   Continuous Infusions:  cefTRIAXone (ROCEPHIN)  IV 1 g (01/22/21 1251)   dextrose 5 % and 0.9% NaCl 75 mL/hr at 01/22/21 0600   metronidazole 500 mg (01/22/21 1131)   PRN Meds:.acetaminophen, albuterol, dextromethorphan-guaiFENesin, dextrose, LORazepam **OR** [DISCONTINUED] LORazepam, ondansetron (ZOFRAN) IV  Antimicrobials: Anti-infectives (From admission, onward)    Start     Dose/Rate Route Frequency Ordered Stop   01/21/21 1200  cefTRIAXone (ROCEPHIN) 1 g in  sodium chloride 0.9 % 100 mL IVPB        1 g 200 mL/hr over 30 Minutes Intravenous Every 24 hours 01/20/21 1721     01/20/21 1900  metroNIDAZOLE (FLAGYL) IVPB 500 mg        500 mg 100 mL/hr over 60 Minutes Intravenous Every 8 hours 01/20/21 1721     01/20/21 1230  azithromycin (ZITHROMAX) 500 mg in sodium chloride 0.9 % 250 mL IVPB  Status:  Discontinued        500 mg 250 mL/hr over 60 Minutes Intravenous Every 24 hours 01/20/21 1226 01/20/21 1815   01/20/21 1230  cefTRIAXone (ROCEPHIN) 1 g in sodium chloride 0.9 % 100 mL IVPB        1 g 200 mL/hr over 30 Minutes Intravenous  Once 01/20/21 1226 01/20/21 1514   01/20/21 1230  clindamycin (CLEOCIN) IVPB 600 mg  Status:  Discontinued        600 mg 100 mL/hr over 30 Minutes Intravenous  Once 01/20/21 1226 01/20/21 1720        I have personally reviewed the following labs and images: CBC: Recent  Labs  Lab 01/20/21 1013 01/21/21 0453 01/22/21 0721  WBC 6.5 10.9* 6.7  NEUTROABS 5.1  --   --   HGB 11.5* 9.8* 8.7*  HCT 33.5* 28.7* 25.7*  MCV 96.8 99.0 97.0  PLT 482* 404* 354   BMP &GFR Recent Labs  Lab 01/20/21 1013 01/21/21 0453 01/22/21 0721  NA 130* 131* 131*  K 3.9 3.6 3.1*  CL 100 105 104  CO2 '23 22 23  ' GLUCOSE 167* 81 171*  BUN '11 12 10  ' CREATININE 0.83 0.87 0.75  CALCIUM 7.6* 7.4* 7.0*  MG  --   --  1.3*   Estimated Creatinine Clearance: 74.6 mL/min (by C-G formula based on SCr of 0.75 mg/dL). Liver & Pancreas: Recent Labs  Lab 01/20/21 1013 01/22/21 0721  AST 36  --   ALT 15  --   ALKPHOS 183*  --   BILITOT 0.7  --   PROT 5.5*  --   ALBUMIN 2.2* 1.4*   No results for input(s): LIPASE, AMYLASE in the last 168 hours. No results for input(s): AMMONIA in the last 168 hours. Diabetic: No results for input(s): HGBA1C in the last 72 hours. Recent Labs  Lab 01/21/21 2235 01/21/21 2347 01/22/21 0453 01/22/21 0728 01/22/21 1143  GLUCAP 94 105* 71 168* 265*   Cardiac Enzymes: Recent Labs  Lab 01/20/21 1013  CKTOTAL 19*   No results for input(s): PROBNP in the last 8760 hours. Coagulation Profile: No results for input(s): INR, PROTIME in the last 168 hours. Thyroid Function Tests: No results for input(s): TSH, T4TOTAL, FREET4, T3FREE, THYROIDAB in the last 72 hours. Lipid Profile: No results for input(s): CHOL, HDL, LDLCALC, TRIG, CHOLHDL, LDLDIRECT in the last 72 hours. Anemia Panel: No results for input(s): VITAMINB12, FOLATE, FERRITIN, TIBC, IRON, RETICCTPCT in the last 72 hours. Urine analysis:    Component Value Date/Time   COLORURINE YELLOW (A) 01/21/2021 0137   APPEARANCEUR CLEAR (A) 01/21/2021 0137   LABSPEC 1.040 (H) 01/21/2021 0137   PHURINE 6.0 01/21/2021 0137   GLUCOSEU 150 (A) 01/21/2021 0137   HGBUR NEGATIVE 01/21/2021 0137   BILIRUBINUR NEGATIVE 01/21/2021 0137   KETONESUR 20 (A) 01/21/2021 0137   PROTEINUR NEGATIVE  01/21/2021 0137   NITRITE NEGATIVE 01/21/2021 0137   LEUKOCYTESUR NEGATIVE 01/21/2021 0137   Sepsis Labs: Invalid input(s): PROCALCITONIN, LACTICIDVEN  Microbiology: Recent Results (from  the past 240 hour(s))  Resp Panel by RT-PCR (Flu A&B, Covid) Nasopharyngeal Swab     Status: None   Collection Time: 01/20/21 10:17 AM   Specimen: Nasopharyngeal Swab; Nasopharyngeal(NP) swabs in vial transport medium  Result Value Ref Range Status   SARS Coronavirus 2 by RT PCR NEGATIVE NEGATIVE Final    Comment: (NOTE) SARS-CoV-2 target nucleic acids are NOT DETECTED.  The SARS-CoV-2 RNA is generally detectable in upper respiratory specimens during the acute phase of infection. The lowest concentration of SARS-CoV-2 viral copies this assay can detect is 138 copies/mL. A negative result does not preclude SARS-Cov-2 infection and should not be used as the sole basis for treatment or other patient management decisions. A negative result may occur with  improper specimen collection/handling, submission of specimen other than nasopharyngeal swab, presence of viral mutation(s) within the areas targeted by this assay, and inadequate number of viral copies(<138 copies/mL). A negative result must be combined with clinical observations, patient history, and epidemiological information. The expected result is Negative.  Fact Sheet for Patients:  EntrepreneurPulse.com.au  Fact Sheet for Healthcare Providers:  IncredibleEmployment.be  This test is no t yet approved or cleared by the [place] FDA and  has been authorized for detection and/or diagnosis of SARS-CoV-2 by FDA under an Emergency Use Authorization (EUA). This EUA will remain  in effect (meaning this test can be used) for the duration of the COVID-19 declaration under Section 564(b)(1) of the Act, 21 U.S.C.section 360bbb-3(b)(1), unless the authorization is terminated  or revoked sooner.        Influenza A by PCR NEGATIVE NEGATIVE Final   Influenza B by PCR NEGATIVE NEGATIVE Final    Comment: (NOTE) The Xpert Xpress SARS-CoV-2/FLU/RSV plus assay is intended as an aid in the diagnosis of influenza from Nasopharyngeal swab specimens and should not be used as a sole basis for treatment. Nasal washings and aspirates are unacceptable for Xpert Xpress SARS-CoV-2/FLU/RSV testing.  Fact Sheet for Patients: EntrepreneurPulse.com.au  Fact Sheet for Healthcare Providers: IncredibleEmployment.be  This test is not yet approved or cleared by the [place] FDA and has been authorized for detection and/or diagnosis of SARS-CoV-2 by FDA under an Emergency Use Authorization (EUA). This EUA will remain in effect (meaning this test can be used) for the duration of the COVID-19 declaration under Section 564(b)(1) of the Act, 21 U.S.C. section 360bbb-3(b)(1), unless the authorization is terminated or revoked.  Performed at [facility], [address] [place], [place] [address]   Blood culture (routine x 2)     Status: None (Preliminary result)   Collection Time: 01/20/21 12:50 PM   Specimen: BLOOD  Result Value Ref Range Status   Specimen Description BLOOD BLOOD LEFT FOREARM  Final   Special Requests   Final    BOTTLES DRAWN AEROBIC AND ANAEROBIC Blood Culture adequate volume   Culture   Final    NO GROWTH 2 DAYS Performed at [facility], [address], [place], [place] [address]    Report Status PENDING  Incomplete  Blood culture (routine x 2)     Status: None (Preliminary result)   Collection Time: 01/20/21  1:12 PM   Specimen: BLOOD  Result Value Ref Range Status   Specimen Description BLOOD RIGHT ANTECUBITAL  Final   Special Requests   Final    BOTTLES DRAWN AEROBIC AND ANAEROBIC Blood Culture adequate volume   Culture   Final    NO GROWTH 2 DAYS Performed at [facility], [address], [place],  [place] [address]    Report Status PENDING  Incomplete    Radiology Studies: No results found.   Cartier Washko T. Olar  If 7PM-7AM, please contact night-coverage [email or web address] 01/22/2021, 2:19 PM

## 2021-01-23 LAB — RENAL FUNCTION PANEL
Albumin: 1.6 g/dL — ABNORMAL LOW (ref 3.5–5.0)
Anion gap: 5 (ref 5–15)
BUN: 10 mg/dL (ref 8–23)
CO2: 22 mmol/L (ref 22–32)
Calcium: 7.3 mg/dL — ABNORMAL LOW (ref 8.9–10.3)
Chloride: 105 mmol/L (ref 98–111)
Creatinine, Ser: 0.82 mg/dL (ref 0.61–1.24)
GFR, Estimated: >60 mL/min (ref >60)
Glucose, Bld: 197 mg/dL — ABNORMAL HIGH (ref 70–99)
Phosphorus: 1.8 mg/dL — ABNORMAL LOW (ref 2.5–4.6)
Potassium: 4.3 mmol/L (ref 3.5–5.1)
Sodium: 132 mmol/L — ABNORMAL LOW (ref 135–145)

## 2021-01-23 LAB — GLUCOSE, CAPILLARY
Glucose-Capillary: 114 mg/dL — ABNORMAL HIGH (ref 70–99)
Glucose-Capillary: 213 mg/dL — ABNORMAL HIGH (ref 70–99)
Glucose-Capillary: 176 mg/dL — ABNORMAL HIGH (ref 70–99)
Glucose-Capillary: 96 mg/dL (ref 70–99)
Glucose-Capillary: 98 mg/dL (ref 70–99)
Glucose-Capillary: 117 mg/dL — ABNORMAL HIGH (ref 70–99)

## 2021-01-23 LAB — HEMOGLOBIN A1C
Hgb A1c MFr Bld: 5.2 % (ref 4.8–5.6)
Mean Plasma Glucose: 102.54 mg/dL

## 2021-01-23 LAB — CBC
HCT: 30 % — ABNORMAL LOW (ref 39.0–52.0)
Hemoglobin: 10 g/dL — ABNORMAL LOW (ref 13.0–17.0)
MCH: 32.7 pg (ref 26.0–34.0)
MCHC: 33.3 g/dL (ref 30.0–36.0)
MCV: 98 fL (ref 80.0–100.0)
Platelets: 401 10*3/uL — ABNORMAL HIGH (ref 150–400)
RBC: 3.06 MIL/uL — ABNORMAL LOW (ref 4.22–5.81)
RDW: 14.4 % (ref 11.5–15.5)
WBC: 6 10*3/uL (ref 4.0–10.5)
nRBC: 0 % (ref 0.0–0.2)

## 2021-01-23 LAB — CK: Total CK: 13 U/L — ABNORMAL LOW (ref 49–397)

## 2021-01-23 LAB — MAGNESIUM: Magnesium: 2 mg/dL (ref 1.7–2.4)

## 2021-01-23 MED ORDER — SODIUM PHOSPHATES 45 MMOLE/15ML IV SOLN
30.0000 mmol | Freq: Once | INTRAVENOUS | Status: AC
  Administered 2021-01-23: 30 mmol via INTRAVENOUS
  Filled 2021-01-23: qty 10

## 2021-01-23 MED ORDER — MELATONIN 5 MG PO TABS
5.0000 mg | ORAL_TABLET | Freq: Every day | ORAL | Status: DC
  Administered 2021-01-23 – 2021-01-27 (×6): 5 mg via ORAL
  Filled 2021-01-23 (×5): qty 1

## 2021-01-23 NOTE — Care Management Important Message (Signed)
Important Message  Patient Details  Name: John Reilly MRN: [id number] Date of Birth: 04-09-1956   Medicare Important Message Given:  Yes     Dannette Barbara 01/23/2021, 1:36 PM

## 2021-01-23 NOTE — Progress Notes (Signed)
Physical Therapy Treatment Patient Details Name: John Reilly MRN: [id number] DOB: [birth year] Today's Date: 01/23/2021   History of Present Illness Pt is a 65 y.o. male presenting to hospital 9/12 with concerns of hypoglycemia (pt found unresponsive in his recliner) and AMS.  Pt admitted with aspiration PNA and sepsis, hypoglycemia, acute metabolic encephalopathy, and orthostatic hypotension.  Imaging showing acute appearing and mildly displaced R lateral 5th rib fx; also chronic B rib fx R 2nd, L 4th, 5th, and 8th.  PMH includes Parkinson's disease, DM, orthostatic hypotension, COPD, htn, peripheral vascular disease, T12 compression fx s/p kyphoplasty, s/p L hip fx  sx, humeral fx 09/2017, R ankle arthroplasty.    PT Comments    Pt resting in bed upon PT arrival; pt agreeable to PT session.  Tolerated LE ex's in bed fairly well.  During session pt min to mod assist semi-supine to sitting edge of bed; min to mod assist with transfers; and CGA to min assist to ambulate 70 feet with RW.  Limited distance ambulating d/t pt fatigue and pt's HR increasing to 133 bpm end of ambulation (pt's HR gradually decreased back down to resting rate with sitting rest in recliner)--nurse notified.  Will continue to focus on strengthening and progressive functional mobility during hospitalization.    Recommendations for follow up therapy are one component of a multi-disciplinary discharge planning process, led by the attending physician.  Recommendations may be updated based on patient status, additional functional criteria and insurance authorization.  Follow Up Recommendations  SNF (pending pt progress)     Equipment Recommendations  Other (comment) (has RW and 4ww at home already)    Recommendations for Other Services OT consult     Precautions / Restrictions Precautions Precautions: Fall Restrictions Weight Bearing Restrictions: No     Mobility  Bed Mobility Overal bed mobility: Needs  Assistance Bed Mobility: Supine to Sit     Supine to sit: Min assist;Mod assist;HOB elevated     General bed mobility comments: assist for trunk    Transfers Overall transfer level: Needs assistance Equipment used: Rolling walker (2 wheeled) Transfers: Sit to/from Stand Sit to Stand: Mod assist;Min assist         General transfer comment: min to mod assist with transfers (x1 trial from bed and x2 trials from recliner); vc's for UE placement  Ambulation/Gait Ambulation/Gait assistance: Min guard;Min assist Gait Distance (Feet): 70 Feet Assistive device: Rolling walker (2 wheeled)   Gait velocity: decreased   General Gait Details: decreased B LE step length; forward flexed posture; vc's to stay closer to [employer] Mobility    Modified Rankin (Stroke Patients Only)       Balance Overall balance assessment: Needs assistance Sitting-balance support: No upper extremity supported;Feet supported Sitting balance-[name]: Fair Sitting balance - Comments: steady static sitting   Standing balance support: Bilateral upper extremity supported Standing balance-[name]: Fair Standing balance comment: pt requiring B UE support for static standing balance                            Cognition Arousal/Alertness: Awake/alert Behavior During Therapy: Flat affect Overall Cognitive Status: Within Functional Limits for tasks assessed  Exercises General Exercises - Lower Extremity Ankle Circles/Pumps: AROM;Strengthening;Both;10 reps;Supine Heel Slides: AROM;Strengthening;Both;10 reps;Supine Hip ABduction/ADduction: AROM;Strengthening;Both;10 reps;Supine    General Comments  Nursing cleared pt for participation in physical therapy.  Pt agreeable to PT session.  Pt's wife present during session.      Pertinent Vitals/Pain Pain Assessment: Faces Faces Pain Scale: Hurts  little more Pain Location: pt's bottom Pain Descriptors / Indicators: Sore Pain Intervention(s): Limited activity within patient's tolerance;Monitored during session;Repositioned O2 sats WFL on room air during sessions activities.    Home Living                      Prior Function            PT Goals (current goals can now be found in the care plan section) Acute Rehab PT Goals Patient Stated Goal: to improve mobility PT Goal Formulation: With patient Time For Goal Achievement: 02/04/21 Potential to Achieve Goals: Good Progress towards PT goals: Progressing toward goals    Frequency    Min 2X/week      PT Plan Current plan remains appropriate    Co-evaluation              AM-PAC PT "6 Clicks" Mobility   Outcome Measure  Help needed turning from your back to your side while in a flat bed without using bedrails?: None Help needed moving from lying on your back to sitting on the side of a flat bed without using bedrails?: A Lot Help needed moving to and from a bed to a chair (including a wheelchair)?: A Little Help needed standing up from a chair using your arms (e.g., wheelchair or bedside chair)?: A Lot Help needed to walk in hospital room?: A Little Help needed climbing 3-5 steps with a railing? : A Lot 6 Click Score: 16    End of Session Equipment Utilized During Treatment: Gait belt Activity Tolerance: Patient tolerated treatment well Patient left: in chair;with call bell/phone within reach;with chair alarm set;with family/visitor present Nurse Communication: Mobility status;Precautions;Other (comment) (pt's elevated HR with activity) PT Visit Diagnosis: Other abnormalities of gait and mobility (R26.89);Muscle weakness (generalized) (M62.81)     Time: [id number] PT Time Calculation (min) (ACUTE ONLY): 45 min  Charges:  $Gait Training: 8-22 mins $Therapeutic Exercise: 8-22 mins $Therapeutic Activity: 8-22 mins                    Leitha Bleak, PT 01/23/21, 6:01 PM

## 2021-01-23 NOTE — Progress Notes (Signed)
PROGRESS NOTE  John Reilly APO:[id number] DOB: 1956/04/06   PCP: Adin Hector, MD  Patient is from: Home.  Lives with his wife.  Uses walker at baseline.  DOA: 01/20/2021 LOS: 3  Chief complaints:  Chief Complaint  Patient presents with   Hypoglycemia     Brief Narrative / Interim history: 65 year old M with PMH of DM-1 on insulin pump, Parkinson disease, COPD not on oxygen, HTN, GERD, EtOH abuse, pancreatic insufficiency and debility brought to ED by EMS due to altered mental status and hypoglycemia to 20.  Reportedly drinks about 42 ounce of beer daily.  Hypoglycemia resolved after dextrose and glucagon by EMS.  Per diabetic coordinator, hypoglycemia felt to be he is expired Dexcom sensor.  He was admitted for acute metabolic encephalopathy in the setting of hypoglycemia and possible aspiration pneumonia.  He was also orthostatic.   Subjective: Seen and examined earlier this morning.  No major events overnight of this morning.  No complaint this morning.  He denies chest pain, dyspnea, GI or UTI symptoms.  Back pain seems to have improved.  Objective: Vitals:   01/23/21 0400 01/23/21 0730 01/23/21 1500 01/23/21 1518  BP: 139/72 123/76  130/69  Pulse: 78 84  73  Resp: _0 Temp: 97.7 F (36.5 C) 97.8 F (36.6 C) 98.7 F (37.1 C) 97.8 F (36.6 C)  TempSrc:  Oral  Oral  SpO2: 100% 98%  100%  Weight:      Height:        Intake/Output Summary (Last 24 hours) at 01/23/2021 1539 Last data filed at 01/23/2021 1519 Gross per 24 hour  Intake 2311.33 ml  Output 800 ml  Net 1511.33 ml   Filed Weights   01/20/21 1009 01/20/21 1703 01/22/21 0441  Weight: 59 kg 59.7 kg 58.1 kg    Examination:  GENERAL: No apparent distress.  Nontoxic. HEENT: MMM.  Vision and hearing grossly intact.  NECK: Supple.  No apparent JVD.  RESP:  No IWOB.  Fair aeration bilaterally. CVS:  RRR. Heart sounds normal.  ABD/GI/GU: BS+. Abd soft, NTND.  MSK/EXT:  Moves extremities. No  apparent deformity. No edema.  SKIN: no apparent skin lesion or wound NEURO: Awake and alert. Oriented appropriately.  No apparent focal neuro deficit. PSYCH: Calm. Normal affect.    Procedures:  None  Microbiology summarized: [id number] and influenza PCR nonreactive. Blood cultures NGTD  Assessment & Plan: Severe sepsis in the setting of aspiration pneumonia: POA.  Met criteria with tachycardia, tachypnea, hypotension and encephalopathy.  CXR concerning for RLL infiltrate.  Procalcitonin elevated.  Sepsis physiology resolved.  Blood cultures NGTD. -Continue ceftriaxone and Flagyl for a total of 5 days-allergic to penicillin -Incentive spirometry, OOB, PT/OT   Uncontrolled DM-1 with hypoglycemia: On insulin pump at home.  Was hypoglycemic to 20 at home when EMS picked him up.  Reportedly, patient is very sensitive to insulin.  Last A1c 6.6% in 03/2020. Recent Labs  Lab 01/22/21 2336 01/23/21 0402 01/23/21 0731 01/23/21 1121 01/23/21 1523  GLUCAP 158* 114* 213* 176* 96  -Continue basal insulin 4 units nightly -Continue SSI-ESRD scale -Wean off D5 infusion. -Check a.m. cortisol   Acute metabolic encephalopathy: Likely due to the above.  No focal neuro deficit.  CT head negative.  Resolved. -Treat treatable causes -Reorientation and delirium precautions.   Orthostatic hypotension: Due to Parkinson?  On Florinef and midodrine at home. -Continue midodrine and Florinef.  -Trial of TED hose -Check orthostatic vitals in the morning  Parkinson disease ([place]) -Continue Sinemet   COPD (chronic obstructive pulmonary disease: No respiratory symptoms. -Continue with Bronchodilators  Chronic pancreatic insufficiency -Continue with Creon  Hyponatremia/hypokalemia/hypomagnesemia/hypophosphatemia-hyponatremia improved.  Hypokalemia and hypomagnesemia resolved. -IV sodium phosphate  Hypocalcemia: Corrects to normal for hypoalbuminemia.  Normocytic anemia: H&H stable. Recent Labs     03/21/20 1609 03/22/20 0122 03/23/20 0504 03/24/20 0513 11/28/20 1706 01/20/21 1013 01/21/21 0453 01/22/21 0721 01/23/21 0649  HGB 12.2* 10.7* 9.5* 9.7* 11.0* 11.5* 9.8* 8.7* 10.0*  -Check anemia panel   Alcohol abuse: Reportedly drinks about 42 ounce of beers a day.  No withdrawal symptoms yet. -Encourage alcohol cessation -Continue with CIWA protocol -Continue multivitamin, thiamine and folic acid  Physical deconditioning/debility -PT/OT  Chronic back pain/history of thoracic compression fracture status post kyphoplasty: Improved. -As needed Tylenol  Severe protein calorie malnutrition: As evidenced by low BMI and significant muscle mass and subcu fat loss. Likely due to diabetes and pancreatic insufficiency. Body mass index is 18.38 kg/m. Nutrition Problem: Severe Malnutrition Etiology: social / environmental circumstances (etoh abuse) Signs/Symptoms: percent weight loss, severe muscle depletion, energy intake < 75% for > or equal to 3 months (14% x 7 months) Percent weight loss: 14 % Interventions: Refer to RD note for recommendations   DVT prophylaxis:  enoxaparin (LOVENOX) injection 40 mg Start: 01/20/21 2200  Code Status: Full code Family Communication: Updated patient's wife over the phone on 9/14. Level of care: Med-Surg Status is: Inpatient  Remains inpatient appropriate because:Persistent severe electrolyte disturbances, Unsafe d/c plan, IV treatments appropriate due to intensity of illness or inability to take PO, and Inpatient level of care appropriate due to severity of illness  Dispo: The patient is from: Home              Anticipated d/c is to: SNF              Patient currently is not medically stable to d/c.   Difficult to place patient No       Consultants:  None   Sch Meds:  Scheduled Meds:  vitamin C  500 mg Oral Daily   carbidopa-levodopa  1 tablet Oral QHS   carbidopa-levodopa  1 tablet Oral QID   enoxaparin (LOVENOX) injection  40  mg Subcutaneous Q24H   feeding supplement  237 mL Oral TID BM   fludrocortisone  200 mcg Oral Daily   folic acid  1 mg Oral Daily   insulin aspart  0-6 Units Subcutaneous Q4H   insulin glargine-yfgn  4 Units Subcutaneous Daily   lipase/protease/amylase  24,000 Units Oral TID AC   melatonin  5 mg Oral QHS   midodrine  2.5 mg Oral BID   multivitamin with minerals  1 tablet Oral Daily   pantoprazole  40 mg Oral Daily   rivastigmine  1.5 mg Oral BID   sodium chloride flush  3 mL Intravenous Q12H   thiamine  100 mg Oral Daily   Or   thiamine  100 mg Intravenous Daily   Continuous Infusions:  cefTRIAXone (ROCEPHIN)  IV 1 g (01/23/21 1328)   dextrose 5 % and 0.9% NaCl 50 mL/hr at 01/23/21 0833   metronidazole Stopped (01/23/21 1306)   PRN Meds:.acetaminophen, albuterol, dextromethorphan-guaiFENesin, dextrose, LORazepam **OR** [DISCONTINUED] LORazepam, ondansetron (ZOFRAN) IV  Antimicrobials: Anti-infectives (From admission, onward)    Start     Dose/Rate Route Frequency Ordered Stop   01/21/21 1200  cefTRIAXone (ROCEPHIN) 1 g in sodium chloride 0.9 % 100 mL IVPB  1 g 200 mL/hr over 30 Minutes Intravenous Every 24 hours 01/20/21 1721     01/20/21 1900  metroNIDAZOLE (FLAGYL) IVPB 500 mg        500 mg 100 mL/hr over 60 Minutes Intravenous Every 8 hours 01/20/21 1721     01/20/21 1230  azithromycin (ZITHROMAX) 500 mg in sodium chloride 0.9 % 250 mL IVPB  Status:  Discontinued        500 mg 250 mL/hr over 60 Minutes Intravenous Every 24 hours 01/20/21 1226 01/20/21 1815   01/20/21 1230  cefTRIAXone (ROCEPHIN) 1 g in sodium chloride 0.9 % 100 mL IVPB        1 g 200 mL/hr over 30 Minutes Intravenous  Once 01/20/21 1226 01/20/21 1514   01/20/21 1230  clindamycin (CLEOCIN) IVPB 600 mg  Status:  Discontinued        600 mg 100 mL/hr over 30 Minutes Intravenous  Once 01/20/21 1226 01/20/21 1720        I have personally reviewed the following labs and images: CBC: Recent Labs   Lab 01/20/21 1013 01/21/21 0453 01/22/21 0721 01/23/21 0649  WBC 6.5 10.9* 6.7 6.0  NEUTROABS 5.1  --   --   --   HGB 11.5* 9.8* 8.7* 10.0*  HCT 33.5* 28.7* 25.7* 30.0*  MCV 96.8 99.0 97.0 98.0  PLT 482* 404* 354 401*   BMP &GFR Recent Labs  Lab 01/20/21 1013 01/21/21 0453 01/22/21 0721 01/23/21 0649  NA 130* 131* 131* 132*  K 3.9 3.6 3.1* 4.3  CL 100 105 104 105  CO2 _0 GLUCOSE 167* 81 171* 197*  BUN _1 CREATININE 0.83 0.87 0.75 0.82  CALCIUM 7.6* 7.4* 7.0* 7.3*  MG  --   --  1.3* 2.0  PHOS  --   --   --  1.8*   Estimated Creatinine Clearance: 72.8 mL/min (by C-G formula based on SCr of 0.82 mg/dL). Liver & Pancreas: Recent Labs  Lab 01/20/21 1013 01/22/21 0721 01/23/21 0649  AST 36  --   --   ALT 15  --   --   ALKPHOS 183*  --   --   BILITOT 0.7  --   --   PROT 5.5*  --   --   ALBUMIN 2.2* 1.4* 1.6*   No results for input(s): LIPASE, AMYLASE in the last 168 hours. No results for input(s): AMMONIA in the last 168 hours. Diabetic: No results for input(s): HGBA1C in the last 72 hours. Recent Labs  Lab 01/22/21 2336 01/23/21 0402 01/23/21 0731 01/23/21 1121 01/23/21 1523  GLUCAP 158* 114* 213* 176* 96   Cardiac Enzymes: Recent Labs  Lab 01/20/21 1013 01/23/21 0649  CKTOTAL 19* 13*   No results for input(s): PROBNP in the last 8760 hours. Coagulation Profile: No results for input(s): INR, PROTIME in the last 168 hours. Thyroid Function Tests: No results for input(s): TSH, T4TOTAL, FREET4, T3FREE, THYROIDAB in the last 72 hours. Lipid Profile: No results for input(s): CHOL, HDL, LDLCALC, TRIG, CHOLHDL, LDLDIRECT in the last 72 hours. Anemia Panel: No results for input(s): VITAMINB12, FOLATE, FERRITIN, TIBC, IRON, RETICCTPCT in the last 72 hours. Urine analysis:    Component Value Date/Time   COLORURINE YELLOW (A) 01/21/2021 0137   APPEARANCEUR CLEAR (A) 01/21/2021 0137   LABSPEC 1.040 (H) 01/21/2021 0137   PHURINE 6.0  01/21/2021 0137   GLUCOSEU 150 (A) 01/21/2021 0137   HGBUR NEGATIVE 01/21/2021 0137   BILIRUBINUR NEGATIVE  01/21/2021 0137   KETONESUR 20 (A) 01/21/2021 [place] 01/21/2021 0137   NITRITE NEGATIVE 01/21/2021 [place] 01/21/2021 0137   Sepsis Labs: Invalid input(s): PROCALCITONIN, [place]  Microbiology: Recent Results (from the past 240 hour(s))  Resp Panel by RT-PCR (Flu A&B, Covid) Nasopharyngeal Swab     Status: None   Collection Time: 01/20/21 10:17 AM   Specimen: Nasopharyngeal Swab; Nasopharyngeal(NP) swabs in vial transport medium  Result Value Ref Range Status   SARS Coronavirus 2 by RT PCR NEGATIVE NEGATIVE Final    Comment: (NOTE) SARS-CoV-2 target nucleic acids are NOT DETECTED.  The SARS-CoV-2 RNA is generally detectable in upper respiratory specimens during the acute phase of infection. The lowest concentration of SARS-CoV-2 viral copies this assay can detect is 138 copies/mL. A negative result does not preclude SARS-Cov-2 infection and should not be used as the sole basis for treatment or other patient management decisions. A negative result may occur with  improper specimen collection/handling, submission of specimen other than nasopharyngeal swab, presence of viral mutation(s) within the areas targeted by this assay, and inadequate number of viral copies(<138 copies/mL). A negative result must be combined with clinical observations, patient history, and epidemiological information. The expected result is Negative.  Fact Sheet for Patients:  EntrepreneurPulse.com.au  Fact Sheet for Healthcare Providers:  IncredibleEmployment.be  This test is no t yet approved or cleared by the [place] FDA and  has been authorized for detection and/or diagnosis of SARS-CoV-2 by FDA under an Emergency Use Authorization (EUA). This EUA will remain  in effect (meaning this test can be used) for the  duration of the COVID-19 declaration under Section 564(b)(1) of the Act, 21 U.S.C.section 360bbb-3(b)(1), unless the authorization is terminated  or revoked sooner.       Influenza A by PCR NEGATIVE NEGATIVE Final   Influenza B by PCR NEGATIVE NEGATIVE Final    Comment: (NOTE) The Xpert Xpress SARS-CoV-2/FLU/RSV plus assay is intended as an aid in the diagnosis of influenza from Nasopharyngeal swab specimens and should not be used as a sole basis for treatment. Nasal washings and aspirates are unacceptable for Xpert Xpress SARS-CoV-2/FLU/RSV testing.  Fact Sheet for Patients: EntrepreneurPulse.com.au  Fact Sheet for Healthcare Providers: IncredibleEmployment.be  This test is not yet approved or cleared by the [place] FDA and has been authorized for detection and/or diagnosis of SARS-CoV-2 by FDA under an Emergency Use Authorization (EUA). This EUA will remain in effect (meaning this test can be used) for the duration of the COVID-19 declaration under Section 564(b)(1) of the Act, 21 U.S.C. section 360bbb-3(b)(1), unless the authorization is terminated or revoked.  Performed at [facility], [address] [place], [place] [address]   Blood culture (routine x 2)     Status: None (Preliminary result)   Collection Time: 01/20/21 12:50 PM   Specimen: BLOOD  Result Value Ref Range Status   Specimen Description BLOOD BLOOD LEFT FOREARM  Final   Special Requests   Final    BOTTLES DRAWN AEROBIC AND ANAEROBIC Blood Culture adequate volume   Culture   Final    NO GROWTH 3 DAYS Performed at [facility], [address] [place], [place] [address]    Report Status PENDING  Incomplete  Blood culture (routine x 2)     Status: None (Preliminary result)   Collection Time: 01/20/21  1:12 PM   Specimen: BLOOD  Result Value Ref Range Status   Specimen Description BLOOD RIGHT ANTECUBITAL  Final   Special Requests   Final     BOTTLES DRAWN AEROBIC AND ANAEROBIC Blood Culture adequate volume   Culture   Final    NO GROWTH 3 DAYS Performed at [facility], [address] [place], [place] [address]    Report Status PENDING  Incomplete    Radiology Studies: No results found.   Yasmin Bronaugh T. [place]  If 7PM-7AM, please contact night-coverage [email or web address] 01/23/2021, 3:39 PM

## 2021-01-24 ENCOUNTER — Inpatient Hospital Stay: Payer: Medicare Other

## 2021-01-24 DIAGNOSIS — Z7189 Other specified counseling: Secondary | ICD-10-CM

## 2021-01-24 LAB — FERRITIN: Ferritin: 240 ng/mL (ref 24–336)

## 2021-01-24 LAB — RENAL FUNCTION PANEL
Albumin: 1.4 g/dL — ABNORMAL LOW (ref 3.5–5.0)
Anion gap: 2 — ABNORMAL LOW (ref 5–15)
BUN: 8 mg/dL (ref 8–23)
CO2: 21 mmol/L — ABNORMAL LOW (ref 22–32)
Calcium: 7.2 mg/dL — ABNORMAL LOW (ref 8.9–10.3)
Chloride: 110 mmol/L (ref 98–111)
Creatinine, Ser: 0.78 mg/dL (ref 0.61–1.24)
GFR, Estimated: >60 mL/min (ref >60)
Glucose, Bld: 153 mg/dL — ABNORMAL HIGH (ref 70–99)
Phosphorus: 3.3 mg/dL (ref 2.5–4.6)
Potassium: 3.7 mmol/L (ref 3.5–5.1)
Sodium: 133 mmol/L — ABNORMAL LOW (ref 135–145)

## 2021-01-24 LAB — CBC
HCT: 27.4 % — ABNORMAL LOW (ref 39.0–52.0)
Hemoglobin: 9.1 g/dL — ABNORMAL LOW (ref 13.0–17.0)
MCH: 32.6 pg (ref 26.0–34.0)
MCHC: 33.2 g/dL (ref 30.0–36.0)
MCV: 98.2 fL (ref 80.0–100.0)
Platelets: 375 10*3/uL (ref 150–400)
RBC: 2.79 MIL/uL — ABNORMAL LOW (ref 4.22–5.81)
RDW: 14.5 % (ref 11.5–15.5)
WBC: 5.5 10*3/uL (ref 4.0–10.5)
nRBC: 0 % (ref 0.0–0.2)

## 2021-01-24 LAB — GLUCOSE, CAPILLARY
Glucose-Capillary: 141 mg/dL — ABNORMAL HIGH (ref 70–99)
Glucose-Capillary: 207 mg/dL — ABNORMAL HIGH (ref 70–99)
Glucose-Capillary: 297 mg/dL — ABNORMAL HIGH (ref 70–99)
Glucose-Capillary: 199 mg/dL — ABNORMAL HIGH (ref 70–99)
Glucose-Capillary: 180 mg/dL — ABNORMAL HIGH (ref 70–99)
Glucose-Capillary: 118 mg/dL — ABNORMAL HIGH (ref 70–99)

## 2021-01-24 LAB — TSH: TSH: 8.423 u[IU]/mL — ABNORMAL HIGH (ref 0.350–4.500)

## 2021-01-24 LAB — RETICULOCYTES
Immature Retic Fract: 12.1 % (ref 2.3–15.9)
RBC.: 2.67 MIL/uL — ABNORMAL LOW (ref 4.22–5.81)
Retic Count, Absolute: 64.6 10*3/uL (ref 19.0–186.0)
Retic Ct Pct: 2.4 % (ref 0.4–3.1)

## 2021-01-24 LAB — IRON AND TIBC: Iron: 36 ug/dL — ABNORMAL LOW (ref 45–182)

## 2021-01-24 LAB — VITAMIN B12: Vitamin B-12: 841 pg/mL (ref 180–914)

## 2021-01-24 LAB — MAGNESIUM: Magnesium: 1.9 mg/dL (ref 1.7–2.4)

## 2021-01-24 LAB — FOLATE: Folate: 28 ng/mL (ref >5.9)

## 2021-01-24 MED ORDER — MIDODRINE HCL 5 MG PO TABS
5.0000 mg | ORAL_TABLET | Freq: Two times a day (BID) | ORAL | Status: DC
  Administered 2021-01-24 – 2021-01-27 (×6): 5 mg via ORAL
  Filled 2021-01-24 (×6): qty 1

## 2021-01-24 NOTE — Progress Notes (Signed)
PROGRESS NOTE  John Reilly BDZ:[id number] DOB: [birth year]   PCP: Adin Hector, MD  Patient is from: Home.  Lives with his wife.  Uses walker at baseline.  DOA: 01/20/2021 LOS: 4  Chief complaints:  Chief Complaint  Patient presents with   Hypoglycemia     Brief Narrative / Interim history: 65 year old M with PMH of DM-1 on insulin pump, Parkinson disease, COPD not on oxygen, HTN, GERD, EtOH abuse, pancreatic insufficiency and debility brought to ED by EMS due to altered mental status and hypoglycemia to 20.  Reportedly drinks about 42 ounce of beer daily.  Hypoglycemia resolved after dextrose and glucagon by EMS.  Per diabetic coordinator, hypoglycemia felt to be he is expired Dexcom sensor.  He was admitted for acute metabolic encephalopathy in the setting of hypoglycemia and possible aspiration pneumonia. He was also very orthostatic.   Completed antibiotic course for possible aspiration pneumonia.  Encephalopathy and hypoglycemia resolved.  Remains deconditioned and orthostatic with poor p.o. intake  Subjective: Seen and examined earlier this morning.  No major events overnight of this morning.  No complaints.  Back pain improved.  He denies chest pain, dyspnea, GI or UTI symptoms.  Patient's RN concerned about patient pocketing food.   Objective: Vitals:   01/23/21 2314 01/24/21 0036 01/24/21 0348 01/24/21 1100  BP: 137/71 129/73 122/67 126/74  Pulse: 68 70 84 88  Resp: _0 Temp: 98 F (36.7 C) 97.9 F (36.6 C) 98 F (36.7 C) 98 F (36.7 C)  TempSrc:   Oral Oral  SpO2: 100% 100% 99% 99%  Weight:      Height:        Intake/Output Summary (Last 24 hours) at 01/24/2021 1550 Last data filed at 01/24/2021 1100 Gross per 24 hour  Intake 1899.83 ml  Output 550 ml  Net 1349.83 ml   Filed Weights   01/20/21 1009 01/20/21 1703 01/22/21 0441  Weight: 59 kg 59.7 kg 58.1 kg    Examination:  GENERAL: Frail looking elderly male.  No apparent distress. HEENT:  MMM.  Vision and hearing grossly intact.  NECK: Supple.  No apparent JVD.  RESP: 99% on RA.  No IWOB.  Fair aeration bilaterally. CVS:  RRR. Heart sounds normal.  ABD/GI/GU: BS+. Abd soft, NTND.  MSK/EXT:  Moves extremities. No apparent deformity.  Trace BLE edema. SKIN: no apparent skin lesion or wound NEURO: Awake and alert. Oriented appropriately.  No apparent focal neuro deficit. PSYCH: Calm. Normal affect.   Procedures:  None  Microbiology summarized: TMHDQ-22 and influenza PCR nonreactive. Blood cultures NGTD  Assessment & Plan: Severe sepsis in the setting of aspiration pneumonia: POA.  Met criteria with tachycardia, tachypnea, hypotension and encephalopathy.  CXR concerning for RLL infiltrate.  Procalcitonin elevated.  Sepsis physiology resolved.  Blood cultures NGTD. -Completing 5 days of CTX and Flagyl on 9/16. -Incentive spirometry, OOB, PT/OT -SLP eval for dysphagia   Uncontrolled DM-1 with hypoglycemia: On insulin pump at home.  Was hypoglycemic to 20 at home when EMS picked him up.  Reportedly, patient is very sensitive to insulin.  Last A1c 6.6% in 03/2020. Recent Labs  Lab 01/23/21 2116 01/23/21 2310 01/24/21 0351 01/24/21 0827 01/24/21 1218  GLUCAP 98 117* 141* 207* 297*  -Continue basal insulin 4 units nightly -Continue SSI-ESRD scale -Discontinue D5 infusion   Acute metabolic encephalopathy: Likely due to the above.  No focal neuro deficit.  CT head negative.  Resolved. -Treat treatable causes -Reorientation and delirium precautions.  Orthostatic hypotension: Multifactorial including Parkinson's disease, uncontrolled diabetes, alcohol abuse and acute illness.  On Florinef and midodrine at home. -Increase midodrine to 5 mg 3 times daily -Elevate head of bed to 45 degree -TED hose -Orthostatic vitals daily -Check echocardiogram and carotid ultrasound -Abdominal binder if no improvement with the above measures.   Parkinson disease ([place]) -Continue  Sinemet   COPD (chronic obstructive pulmonary disease: No respiratory symptoms. -Continue with Bronchodilators  Chronic pancreatic insufficiency -Continue with Creon  Hyponatremia/hypokalemia/hypomagnesemia/hypophosphatemia-hyponatremia improved.  Hypokalemia, hypomagnesemia and hypophosphatemia resolved. -Continue monitoring  Hypocalcemia: Corrects to normal for hypoalbuminemia.  Normocytic anemia: H&H stable.  Anemia panel basically normal. Recent Labs    03/21/20 1609 03/22/20 0122 03/23/20 0504 03/24/20 0513 11/28/20 1706 01/20/21 1013 01/21/21 0453 01/22/21 0721 01/23/21 0649 01/24/21 0546  HGB 12.2* 10.7* 9.5* 9.7* 11.0* 11.5* 9.8* 8.7* 10.0* 9.1*  -Monitor H&H   Alcohol abuse: reportedly drinks about 42 ounce of beers a day.  No withdrawal symptoms yet. -Encouraged alcohol cessation -Continue with CIWA protocol -Continue multivitamin, thiamine and folic acid  Physical deconditioning/debility -PT/OT  Chronic back pain/thoracic compression fracture status post kyphoplasty: Improved -As needed Tylenol  Severe protein calorie malnutrition: As evidenced by low BMI, significant weight loss and significant muscle mass and subcu fat loss. Likely due to diabetes, pancreatic insufficiency, alcohol abuse and other chronic illness.  Poor p.o. intake.  -Liberate diet Body mass index is 18.38 kg/m. Nutrition Problem: Severe Malnutrition Etiology: social / environmental circumstances (etoh abuse) Signs/Symptoms: percent weight loss, severe muscle depletion, energy intake < 75% for > or equal to 3 months (14% x 7 months) Percent weight loss: 14 % Interventions: Refer to RD note for recommendations  Goal of care counseling: significant comorbidity as above.  Poor long-term prognosis but is still full code.  I do not think heroic measures such as CPR and intubation would do him good. -Palliative care consult   DVT prophylaxis:  Place TED hose Start: 01/24/21  1127 enoxaparin (LOVENOX) injection 40 mg Start: 01/20/21 2200  Code Status: Full code Family Communication: Updated patient's wife over the phone Level of care: Med-Surg Status is: Inpatient  Remains inpatient appropriate because:Hemodynamically unstable, Ongoing diagnostic testing needed not appropriate for outpatient work up, Unsafe d/c plan, IV treatments appropriate due to intensity of illness or inability to take PO, and Inpatient level of care appropriate due to severity of illness  Dispo: The patient is from: Home              Anticipated d/c is to: SNF              Patient currently is not medically stable to d/c.   Difficult to place patient No       Consultants:  Palliative medicine   Sch Meds:  Scheduled Meds:  vitamin C  500 mg Oral Daily   carbidopa-levodopa  1 tablet Oral QHS   carbidopa-levodopa  1 tablet Oral QID   enoxaparin (LOVENOX) injection  40 mg Subcutaneous Q24H   feeding supplement  237 mL Oral TID BM   fludrocortisone  200 mcg Oral Daily   folic acid  1 mg Oral Daily   insulin aspart  0-6 Units Subcutaneous Q4H   insulin glargine-yfgn  4 Units Subcutaneous Daily   lipase/protease/amylase  24,000 Units Oral TID AC   melatonin  5 mg Oral QHS   midodrine  5 mg Oral BID   multivitamin with minerals  1 tablet Oral Daily   pantoprazole  40 mg Oral  Daily   rivastigmine  1.5 mg Oral BID   sodium chloride flush  3 mL Intravenous Q12H   thiamine  100 mg Oral Daily   Or   thiamine  100 mg Intravenous Daily   Continuous Infusions:  cefTRIAXone (ROCEPHIN)  IV Stopped (01/23/21 1358)   metronidazole 500 mg (01/24/21 1440)   PRN Meds:.acetaminophen, albuterol, dextromethorphan-guaiFENesin, dextrose, ondansetron (ZOFRAN) IV  Antimicrobials: Anti-infectives (From admission, onward)    Start     Dose/Rate Route Frequency Ordered Stop   01/21/21 1200  cefTRIAXone (ROCEPHIN) 1 g in sodium chloride 0.9 % 100 mL IVPB        1 g 200 mL/hr over 30 Minutes  Intravenous Every 24 hours 01/20/21 1721 01/24/21 2359   01/20/21 1900  metroNIDAZOLE (FLAGYL) IVPB 500 mg        500 mg 100 mL/hr over 60 Minutes Intravenous Every 8 hours 01/20/21 1721 01/24/21 2359   01/20/21 1230  azithromycin (ZITHROMAX) 500 mg in sodium chloride 0.9 % 250 mL IVPB  Status:  Discontinued        500 mg 250 mL/hr over 60 Minutes Intravenous Every 24 hours 01/20/21 1226 01/20/21 1815   01/20/21 1230  cefTRIAXone (ROCEPHIN) 1 g in sodium chloride 0.9 % 100 mL IVPB        1 g 200 mL/hr over 30 Minutes Intravenous  Once 01/20/21 1226 01/20/21 1514   01/20/21 1230  clindamycin (CLEOCIN) IVPB 600 mg  Status:  Discontinued        600 mg 100 mL/hr over 30 Minutes Intravenous  Once 01/20/21 1226 01/20/21 1720        I have personally reviewed the following labs and images: CBC: Recent Labs  Lab 01/20/21 1013 01/21/21 0453 01/22/21 0721 01/23/21 0649 01/24/21 0546  WBC 6.5 10.9* 6.7 6.0 5.5  NEUTROABS 5.1  --   --   --   --   HGB 11.5* 9.8* 8.7* 10.0* 9.1*  HCT 33.5* 28.7* 25.7* 30.0* 27.4*  MCV 96.8 99.0 97.0 98.0 98.2  PLT 482* 404* 354 401* 375   BMP &GFR Recent Labs  Lab 01/20/21 1013 01/21/21 0453 01/22/21 0721 01/23/21 0649 01/24/21 0546  NA 130* 131* 131* 132* 133*  K 3.9 3.6 3.1* 4.3 3.7  CL 100 105 104 105 110  CO2 _0 21*  GLUCOSE 167* 81 171* 197* 153*  BUN _1 CREATININE 0.83 0.87 0.75 0.82 0.78  CALCIUM 7.6* 7.4* 7.0* 7.3* 7.2*  MG  --   --  1.3* 2.0 1.9  PHOS  --   --   --  1.8* 3.3   Estimated Creatinine Clearance: 74.6 mL/min (by C-G formula based on SCr of 0.78 mg/dL). Liver & Pancreas: Recent Labs  Lab 01/20/21 1013 01/22/21 0721 01/23/21 0649 01/24/21 0546  AST 36  --   --   --   ALT 15  --   --   --   ALKPHOS 183*  --   --   --   BILITOT 0.7  --   --   --   PROT 5.5*  --   --   --   ALBUMIN 2.2* 1.4* 1.6* 1.4*   No results for input(s): LIPASE, AMYLASE in the last 168 hours. No results for input(s):  AMMONIA in the last 168 hours. Diabetic: Recent Labs    01/23/21 0649  HGBA1C 5.2   Recent Labs  Lab 01/23/21 2116 01/23/21 2310 01/24/21 0351 01/24/21 0827 01/24/21  [place] 98 117* 141* 207* 297*   Cardiac Enzymes: Recent Labs  Lab 01/20/21 1013 01/23/21 0649  CKTOTAL 19* 13*   No results for input(s): PROBNP in the last 8760 hours. Coagulation Profile: No results for input(s): INR, PROTIME in the last 168 hours. Thyroid Function Tests: Recent Labs    01/24/21 0546  TSH 8.423*   Lipid Profile: No results for input(s): CHOL, HDL, LDLCALC, TRIG, CHOLHDL, LDLDIRECT in the last 72 hours. Anemia Panel: Recent Labs    01/24/21 0546  VITAMINB12 841  FOLATE 28.0  FERRITIN 240  TIBC NOT CALCULATED  IRON 36*  RETICCTPCT 2.4   Urine analysis:    Component Value Date/Time   COLORURINE YELLOW (A) 01/21/2021 0137   APPEARANCEUR CLEAR (A) 01/21/2021 0137   LABSPEC 1.040 (H) 01/21/2021 0137   PHURINE 6.0 01/21/2021 0137   GLUCOSEU 150 (A) 01/21/2021 0137   HGBUR NEGATIVE 01/21/2021 0137   BILIRUBINUR NEGATIVE 01/21/2021 0137   KETONESUR 20 (A) 01/21/2021 0137   PROTEINUR NEGATIVE 01/21/2021 0137   NITRITE NEGATIVE 01/21/2021 0137   LEUKOCYTESUR NEGATIVE 01/21/2021 0137   Sepsis Labs: Invalid input(s): PROCALCITONIN, [place]  Microbiology: Recent Results (from the past 240 hour(s))  Resp Panel by RT-PCR (Flu A&B, Covid) Nasopharyngeal Swab     Status: None   Collection Time: 01/20/21 10:17 AM   Specimen: Nasopharyngeal Swab; Nasopharyngeal(NP) swabs in vial transport medium  Result Value Ref Range Status   SARS Coronavirus 2 by RT PCR NEGATIVE NEGATIVE Final    Comment: (NOTE) SARS-CoV-2 target nucleic acids are NOT DETECTED.  The SARS-CoV-2 RNA is generally detectable in upper respiratory specimens during the acute phase of infection. The lowest concentration of SARS-CoV-2 viral copies this assay can detect is 138 copies/mL. A negative result does  not preclude SARS-Cov-2 infection and should not be used as the sole basis for treatment or other patient management decisions. A negative result may occur with  improper specimen collection/handling, submission of specimen other than nasopharyngeal swab, presence of viral mutation(s) within the areas targeted by this assay, and inadequate number of viral copies(<138 copies/mL). A negative result must be combined with clinical observations, patient history, and epidemiological information. The expected result is Negative.  Fact Sheet for Patients:  EntrepreneurPulse.com.au  Fact Sheet for Healthcare Providers:  IncredibleEmployment.be  This test is no t yet approved or cleared by the [place] FDA and  has been authorized for detection and/or diagnosis of SARS-CoV-2 by FDA under an Emergency Use Authorization (EUA). This EUA will remain  in effect (meaning this test can be used) for the duration of the COVID-19 declaration under Section 564(b)(1) of the Act, 21 U.S.C.section 360bbb-3(b)(1), unless the authorization is terminated  or revoked sooner.       Influenza A by PCR NEGATIVE NEGATIVE Final   Influenza B by PCR NEGATIVE NEGATIVE Final    Comment: (NOTE) The Xpert Xpress SARS-CoV-2/FLU/RSV plus assay is intended as an aid in the diagnosis of influenza from Nasopharyngeal swab specimens and should not be used as a sole basis for treatment. Nasal washings and aspirates are unacceptable for Xpert Xpress SARS-CoV-2/FLU/RSV testing.  Fact Sheet for Patients: EntrepreneurPulse.com.au  Fact Sheet for Healthcare Providers: IncredibleEmployment.be  This test is not yet approved or cleared by the [place] FDA and has been authorized for detection and/or diagnosis of SARS-CoV-2 by FDA under an Emergency Use Authorization (EUA). This EUA will remain in effect (meaning this test can be used) for the  duration of the COVID-19  declaration under Section 564(b)(1) of the Act, 21 U.S.C. section 360bbb-3(b)(1), unless the authorization is terminated or revoked.  Performed at [facility], [address] [place], [place] [address]   Blood culture (routine x 2)     Status: None (Preliminary result)   Collection Time: 01/20/21 12:50 PM   Specimen: BLOOD  Result Value Ref Range Status   Specimen Description BLOOD BLOOD LEFT FOREARM  Final   Special Requests   Final    BOTTLES DRAWN AEROBIC AND ANAEROBIC Blood Culture adequate volume   Culture   Final    NO GROWTH 4 DAYS Performed at [facility], [address] [place], [place] [address]    Report Status PENDING  Incomplete  Blood culture (routine x 2)     Status: None (Preliminary result)   Collection Time: 01/20/21  1:12 PM   Specimen: BLOOD  Result Value Ref Range Status   Specimen Description BLOOD RIGHT ANTECUBITAL  Final   Special Requests   Final    BOTTLES DRAWN AEROBIC AND ANAEROBIC Blood Culture adequate volume   Culture   Final    NO GROWTH 4 DAYS Performed at [facility], [address], [place], [place] [address]    Report Status PENDING  Incomplete    Radiology Studies: US Carotid Bilateral  Result Date: 01/24/2021 CLINICAL DATA:  65 year old male with a history of orthostatic hypotension EXAM: BILATERAL CAROTID DUPLEX ULTRASOUND TECHNIQUE: Pearline Cables scale imaging, color Doppler and duplex ultrasound were performed of bilateral carotid and vertebral arteries in the neck. COMPARISON:  None. FINDINGS: Criteria: Quantification of carotid stenosis is based on velocity parameters that correlate the residual internal carotid diameter with NASCET-based stenosis levels, using the diameter of the distal internal carotid lumen as the denominator for stenosis measurement. The following velocity measurements were obtained: RIGHT ICA:  Systolic 82 cm/sec, Diastolic 14 cm/sec CCA:  63 cm/sec SYSTOLIC ICA/CCA  RATIO:  1.3 ECA:  76 cm/sec LEFT ICA:  Systolic 78 cm/sec, Diastolic 15 cm/sec CCA:  57 cm/sec SYSTOLIC ICA/CCA RATIO:  1.4 ECA:  51 cm/sec Right Brachial SBP: Not acquired Left Brachial SBP: Not acquired RIGHT CAROTID ARTERY: No significant calcified disease of the right common carotid artery. Intermediate waveform maintained. Homogeneous plaque without significant calcifications at the right carotid bifurcation. Low resistance waveform of the right ICA. No significant tortuosity. RIGHT VERTEBRAL ARTERY: Antegrade flow with low resistance waveform. LEFT CAROTID ARTERY: No significant calcified disease of the left common carotid artery. Intermediate waveform maintained. Homogeneous plaque at the left carotid bifurcation without significant calcifications. Low resistance waveform of the left ICA. LEFT VERTEBRAL ARTERY:  Antegrade flow with low resistance waveform. IMPRESSION: Color duplex indicates minimal homogeneous plaque, with no hemodynamically significant stenosis by duplex criteria in the extracranial cerebrovascular circulation. Signed, Dulcy Fanny. Dellia Nims, RPVI [facility] Radiology Electronically Signed   By: Corrie Mckusick D.O.   On: 01/24/2021 14:22     Yonathan [place]row [place]  If 7PM-7AM, please contact night-coverage [email or web address] 01/24/2021, 3:50 PM

## 2021-01-24 NOTE — Evaluation (Signed)
Clinical/Bedside Swallow Evaluation Patient Details  Name: John Reilly MRN: [id number] Date of Birth: 03/03/1956  Today's Date: 01/24/2021 Time: SLP Start Time (ACUTE ONLY): 1150 SLP Stop Time (ACUTE ONLY): [id number] SLP Time Calculation (min) (ACUTE ONLY): 50 min  Past Medical History:  Past Medical History:  Diagnosis Date   Cancer ([place])    Pt reports on 10/02/17 that he has never been dx with cancer   Colon polyps    COPD (chronic obstructive pulmonary disease) ([place])    Diabetes mellitus without complication ([place])    type 1   GERD (gastroesophageal reflux disease)    Hip fracture ([place])    History of malignant neoplasm of colon    Hypertension    Multiple duodenal ulcers    Parkinson disease ([place])    Peripheral vascular disease ([place])    Peripheral Neuropathy   Retinopathy    T12 compression fracture ([place]) 11/2020   Past Surgical History:  Past Surgical History:  Procedure Laterality Date   ANKLE ARTHROPLASTY Right 12/2011   [place], 2002, 2006, 2009, 2013, 2018   COLONOSCOPY WITH PROPOFOL N/A 10/12/2016   Procedure: COLONOSCOPY WITH PROPOFOL;  Surgeon: Manya Silvas, MD;  Location: [facility] ENDOSCOPY;  Service: Endoscopy;  Laterality: N/A;   ESOPHAGOGASTRODUODENOSCOPY  07/09/2011   FINGER SURGERY Right 09/2007   thumb repair due to crushing injury; metal hardware   [place] (IM) NAIL INTERTROCHANTERIC Left 08/08/2018   Procedure: INTRAMEDULLARY ([place]) NAIL INTERTROCHANTRIC;  Surgeon: Corky Mull, MD;  Location: [facility] ORS;  Service: Orthopedics;  Laterality: Left;   KYPHOPLASTY N/A 12/12/2020   Procedure: T 12 KYPHOPLASTY;  Surgeon: Hessie Knows, MD;  Location: [facility] ORS;  Service: Orthopedics;  Laterality: N/A;   HPI:  Pt  is a 65 y.o. male with medical history significant of Alzheimer's Dementia per chart, hypoglycimic events, ETOH abuse, HTN, type 1 diabetes on insulin pump, COPD, GERD, PVD, Parkinson's disease,  orthostatic hypotension, heavy drinker, who presents with altered mental status.  Per patient's wife at the bedside, patient was found to be unresponsive with altered mental status this morning.  His blood sugar at low at 20s.  Of note, diabetic educator stated per her note, " It appears that patient has not changed his Dexcom sensor and it is expired.  The sensor allows the insulin pump reduce dose when blood sugar is dropping to prevent lows.  It also alarms so that the patient knows that blood sugar is dropping.  This function was not working when patient had low blood sugar".  CT of Chest: Findings of suspected aspiration related pneumonia in the RIGHT  lower lobe, airspace disease seen to a lesser extent in the LEFT chest and associated with bilateral effusions.    Assessment / Plan / Recommendation  Clinical Impression  Pt appears to present w/ adequate oropharyngeal phase swallow w/ No oropharyngeal phase dysphagia noted, No neuromuscular deficits noted impacting the oropharyngeal swallow. Pt does have a baseline of Dementia; Parkinson's Disease. Any Neurological disease process can impact the swallow function. Pt consumed po trials at this bedside evaluation but required much verbal encouragement as he only wanted to "lie down now".  No overt, clinical s/s of aspiration noted during po trials. Pt appears at reduced risk for aspiration following general aspiration precautions.     During this eval, pt required verbal cues and assistance in positioning upright in bed for po trials. He fed himself a few trials  w/ setup support; monitoring of boluses given. W/ the po trials, no overt coughing, decline in vocal quality, or change in respiratory presentation during/post trials. Oral phase appeared [facility] w/ timely bolus management, mastication, and control of bolus propulsion for A-P transfer for swallowing. Oral clearing achieved w/ all trial consistencies. OM Exam (limited d/t pt's participation) appeared [facility]  w/ no unilateral weakness noted. Speech Clear.     He appears disinterested in most foods and oral intake other than Ensure and ice cream during this assessment today. Pt's behavior and engagement were limited and suspect part of the impact of the Severe malnutrition noted bt Dietician. The Dietician reports poor oral intake has been going on for at least 6 months. Also endorses a 50 lb wt loss in the last year. ~14% weight loss noted in the last 7 months which is "severe", per the Dietician.   Refrained from much change of the diet consistency in order to be most liberal w/ food choices. Pt would benefit from Supervision at meals to support him. Suspect overall Cognitive decline impact re: his reduced insight and awareness along w/ the other issues including ETOH abuse.   Recommend continue a Regular diet w/ more mech soft consistency/well-Cut meats, moistened foods for ease of eating; Thin liquids. Recommend general aspiration precautions, Supervision at meals d/t Cognitive decline/Dementia and d/t Parkinson's Dis. Pills Whole vs Crushed in Puree for safer, easier swallowing. GERD precautions. Education given on Pills in Puree; food consistencies and easy to eat options; general aspiration precautions. MD to reconsult if any new needs arise. Team updated. F/u w/ Dietician and w/ Palliative Care; [place] including severe malnutrition/FTT should be discussed w/ pt/Wife. SLP Visit Diagnosis: Dysphagia, unspecified (R13.10) (GERD)    Aspiration Risk   (reduced following general aspiration precautions; GERD precautions)    Diet Recommendation   Regular diet w/ more mech soft consistency/well-Cut meats, moistened foods for ease of eating; Thin liquids. Recommend general aspiration precautions, Supervision at meals d/t Cognitive decline/Dementia and d/t Parkinson's Dis.   Medication Administration: Whole meds with puree (vs Crushed in Puree)    Other  Recommendations Recommended Consults: Consider GI  evaluation (Dietician f/u; Palliative Care f/u) Oral Care Recommendations: Oral care BID;Oral care before and after PO;Staff/trained caregiver to provide oral care Other Recommendations:  (n/a)    Recommendations for follow up therapy are one component of a multi-disciplinary discharge planning process, led by the attending physician.  Recommendations may be updated based on patient status, additional functional criteria and insurance authorization.  Follow up Recommendations None      Frequency and Duration  (n/a)   (n/a)       Prognosis Prognosis for Safe Diet Advancement: Fair (-Good) Barriers to Reach Goals: Cognitive deficits;Time post onset;Severity of deficits;Behavior;Motivation Barriers/Prognosis Comment: ETOH abuse      Swallow Study   General Date of Onset: 01/20/21 HPI: Pt  is a 65 y.o. male with medical history significant of Alzheimer's Dementia per chart, hypoglycimic events, ETOH abuse, HTN, type 1 diabetes on insulin pump, COPD, GERD, PVD, Parkinson's disease, orthostatic hypotension, heavy drinker, who presents with altered mental status.  Per patient's wife at the bedside, patient was found to be unresponsive with altered mental status this morning.  His blood sugar at low at 20s.  Of note, diabetic educator stated per her note, " It appears that patient has not changed his Dexcom sensor and it is expired.  The sensor allows the insulin pump reduce dose when blood sugar is dropping to  prevent lows.  It also alarms so that the patient knows that blood sugar is dropping.  This function was not working when patient had low blood sugar".  CT of Chest: Findings of suspected aspiration related pneumonia in the RIGHT  lower lobe, airspace disease seen to a lesser extent in the LEFT chest and associated with bilateral effusions. Type of Study: Bedside Swallow Evaluation Previous Swallow Assessment: none Diet Prior to this Study: Regular;Thin liquids (poor intake;  alcohol) Temperature Spikes Noted: No (wbc 5.5) Respiratory Status: Room air History of Recent Intubation: No Behavior/Cognition: Alert;Cooperative;Pleasant mood;Confused;Distractible;Requires cueing (baseline Dementia) Oral Cavity Assessment:  (limited) Oral Care Completed by SLP: Recent completion by staff Oral Cavity - Dentition: Adequate natural dentition (few missing) Vision: Functional for self-feeding Self-Feeding Abilities: Able to feed self;Needs assist;Needs set up (encouragement) Patient Positioning: Upright in bed (needed min positioning support for more forward sitting - he wanted to lie down) Baseline Vocal Quality: Normal Volitional Cough: Strong    Oral/Motor/Sensory Function Overall Oral Motor/Sensory Function: Within functional limits   Ice Chips Ice chips: Not tested   Thin Liquid Thin Liquid: Within functional limits Presentation: Cup;Self Fed (5 trials accepted)    Nectar Thick Nectar Thick Liquid: Not tested   Honey Thick Honey Thick Liquid: Not tested   Puree Puree: Within functional limits Presentation: Spoon (fed; 2 trials)   Solid     Solid: Within functional limits Presentation: Spoon;Self Fed (handed spoon to pt: 3 trials)         Orinda Kenner, MS, [employer] Speech Language Pathologist Rehab Services [phone] [facility] 01/24/2021,3:18 PM

## 2021-01-24 NOTE — TOC Progression Note (Addendum)
Transition of Care [facility]) - Progression Note    Patient Details  Name: AMAURIE GRIGAS MRN: [id number] Date of Birth: 08/22/1955  Transition of Care [facility]) CM/SW [place], LCSW Phone Number: 01/24/2021, 11:32 AM  Clinical Narrative:  Called wife and let her know that [facility] is only bed offer so far. Only other facility that has not responded is [facility]. [facility]. Left message for admissions coordinator asking her to review.  1:30 pm: [facility] is able to offer patient a bed. Per MD, likely discharge over the weekend if orthostatics improved. Wife is aware and agreeable.   Expected Discharge Plan: [place] Barriers to Discharge: Continued Medical Work up  Expected Discharge Plan and Services Expected Discharge Plan: [place] arrangements for the past 2 months: Single Family Home                                       Social Determinants of Health (SDOH) Interventions    Readmission Risk Interventions No flowsheet data found.

## 2021-01-24 NOTE — Progress Notes (Signed)
Mobility Specialist - Progress Note   01/24/21 1100  Mobility  Activity Ambulated in hall  Level of Assistance Minimal assist, patient does 75% or more  Assistive Device Front wheel walker  Distance Ambulated (ft) 0 ft  Mobility Sit up in bed/chair position for meals  Mobility Response Tolerated well  Mobility performed by Mobility specialist  $Mobility charge 1 Mobility    Supine = 100/57 BP Sitting = 75/40 BP Post-Supine = 109/54 BP   Pt finishing up on bedpan upon arrival, NT in to assist with peri-care. Orthostatics checked and recorded above, unable to progress to standing d/t low BP with lightheadedness. Pt also reports discomfort in stomach, but denies pain and nausea. Pt returned supine with alarm set, needs in reach.    Kathee Delton Mobility Specialist 01/24/21, 11:26 AM

## 2021-01-25 ENCOUNTER — Inpatient Hospital Stay: Admit: 2021-01-25 | Payer: Medicare Other

## 2021-01-25 LAB — CULTURE, BLOOD (ROUTINE X 2)
Culture: NO GROWTH
Culture: NO GROWTH
Special Requests: ADEQUATE
Special Requests: ADEQUATE

## 2021-01-25 LAB — GLUCOSE, CAPILLARY
Glucose-Capillary: 105 mg/dL — ABNORMAL HIGH (ref 70–99)
Glucose-Capillary: 112 mg/dL — ABNORMAL HIGH (ref 70–99)
Glucose-Capillary: 123 mg/dL — ABNORMAL HIGH (ref 70–99)
Glucose-Capillary: 158 mg/dL — ABNORMAL HIGH (ref 70–99)
Glucose-Capillary: 257 mg/dL — ABNORMAL HIGH (ref 70–99)
Glucose-Capillary: 80 mg/dL (ref 70–99)

## 2021-01-25 LAB — RENAL FUNCTION PANEL
Albumin: 1.5 g/dL — ABNORMAL LOW (ref 3.5–5.0)
Anion gap: 3 — ABNORMAL LOW (ref 5–15)
BUN: 10 mg/dL (ref 8–23)
CO2: 23 mmol/L (ref 22–32)
Calcium: 7.6 mg/dL — ABNORMAL LOW (ref 8.9–10.3)
Chloride: 109 mmol/L (ref 98–111)
Creatinine, Ser: 0.82 mg/dL (ref 0.61–1.24)
GFR, Estimated: >60 mL/min (ref >60)
Glucose, Bld: 119 mg/dL — ABNORMAL HIGH (ref 70–99)
Phosphorus: 2.9 mg/dL (ref 2.5–4.6)
Potassium: 4.1 mmol/L (ref 3.5–5.1)
Sodium: 135 mmol/L (ref 135–145)

## 2021-01-25 LAB — HEMOGLOBIN AND HEMATOCRIT, BLOOD
HCT: 28 % — ABNORMAL LOW (ref 39.0–52.0)
Hemoglobin: 9.1 g/dL — ABNORMAL LOW (ref 13.0–17.0)

## 2021-01-25 LAB — TSH: TSH: 7.323 u[IU]/mL — ABNORMAL HIGH (ref 0.350–4.500)

## 2021-01-25 LAB — CORTISOL-AM, BLOOD: Cortisol - AM: 9.6 ug/dL (ref 6.7–22.6)

## 2021-01-25 LAB — T4, FREE: Free T4: 1.01 ng/dL (ref 0.61–1.12)

## 2021-01-25 LAB — MAGNESIUM: Magnesium: 1.7 mg/dL (ref 1.7–2.4)

## 2021-01-25 MED ORDER — INSULIN ASPART 100 UNIT/ML IJ SOLN
0.0000 [IU] | Freq: Three times a day (TID) | INTRAMUSCULAR | Status: DC
  Administered 2021-01-26: 3 [IU] via SUBCUTANEOUS
  Administered 2021-01-26: 2 [IU] via SUBCUTANEOUS
  Filled 2021-01-25 (×2): qty 1

## 2021-01-25 MED ORDER — ALBUMIN HUMAN 25 % IV SOLN
25.0000 g | Freq: Four times a day (QID) | INTRAVENOUS | Status: AC
  Administered 2021-01-25 – 2021-01-26 (×3): 25 g via INTRAVENOUS
  Filled 2021-01-25 (×4): qty 100

## 2021-01-25 MED ORDER — FUROSEMIDE 10 MG/ML IJ SOLN
40.0000 mg | Freq: Once | INTRAMUSCULAR | Status: AC
  Administered 2021-01-25: 40 mg via INTRAVENOUS
  Filled 2021-01-25: qty 4

## 2021-01-25 NOTE — Progress Notes (Signed)
PROGRESS NOTE  LITTLETON HAUB IBB:[id number] DOB: 11/24/1955   PCP: Adin Hector, MD  Patient is from: Home.  Lives with his wife.  Uses walker at baseline.  DOA: 01/20/2021 LOS: 5  Chief complaints:  Chief Complaint  Patient presents with   Hypoglycemia     Brief Narrative / Interim history: 65 year old M with PMH of DM-1 on insulin pump, Parkinson disease, COPD not on oxygen, HTN, GERD, EtOH abuse, pancreatic insufficiency and debility brought to ED by EMS due to altered mental status and hypoglycemia to 20.  Reportedly drinks about 42 ounce of beer daily.  Hypoglycemia resolved after dextrose and glucagon by EMS.  Per diabetic coordinator, hypoglycemia felt to be he is expired Dexcom sensor.  He was admitted for acute metabolic encephalopathy in the setting of hypoglycemia and possible aspiration pneumonia. He was also very orthostatic.   Completed antibiotic course for possible aspiration pneumonia.  Encephalopathy and hypoglycemia resolved.  Remains deconditioned with poor p.o. intake and significant orthostasis.  Subjective: Seen and examined earlier this morning.  No major events overnight of this morning.  No complaints.  He denies chest pain, dyspnea, GI or UTI symptoms.  Appetite is poor.  He denies dysphagia or odynophagia.  Has not tried to get out of the bed today yet.  We had discussion about CODE STATUS, pros and cons of CPR and intubation.   Objective: Vitals:   01/25/21 0028 01/25/21 0421 01/25/21 0847 01/25/21 1202  BP: 128/73 135/75 129/68 116/70  Pulse: 87 72 86 85  Resp: $Remo'18 20 18 18  'XGtNb$ Temp: 97.8 F (36.6 C) (!) 97.5 F (36.4 C) (!) 97.5 F (36.4 C) 97.7 F (36.5 C)  TempSrc:    Oral  SpO2: 99% 100% 100% 100%  Weight:      Height:        Intake/Output Summary (Last 24 hours) at 01/25/2021 1513 Last data filed at 01/25/2021 1401 Gross per 24 hour  Intake 723 ml  Output --  Net 723 ml   Filed Weights   01/20/21 1009 01/20/21 1703 01/22/21 0441   Weight: 59 kg 59.7 kg 58.1 kg    Examination:  GENERAL: Frail looking elderly male.  No apparent distress. HEENT: MMM.  Vision and hearing grossly intact.  NECK: Supple.  No apparent JVD.  RESP: 100% on RA.  No IWOB.  Fair aeration bilaterally. CVS:  RRR. Heart sounds normal.  ABD/GI/GU: BS+. Abd soft, NTND.  MSK/EXT:  Moves extremities. No apparent deformity. No edema.  TED hose in place. SKIN: no apparent skin lesion or wound NEURO: Awake but not quite alert.  Oriented x4.  No apparent focal neuro deficit but generalized weakness. PSYCH: Calm. Normal affect.   Procedures:  None  Microbiology summarized: [id number] and influenza PCR nonreactive. Blood cultures NGTD  Assessment & Plan: Severe sepsis in the setting of aspiration pneumonia: POA.  Met criteria with tachycardia, tachypnea, hypotension and encephalopathy.  CXR concerning for RLL infiltrate.  Procalcitonin elevated.  Sepsis physiology resolved.  Blood cultures NGTD. -Completing 5 days of CTX and Flagyl on 9/16. -Incentive spirometry, OOB, PT/OT -Appreciate recommendation by SLP-regular diet with more mechanical soft consistency   Uncontrolled DM-1 with hypoglycemia: On insulin pump at home.  Was hypoglycemic to 20 at home when EMS picked him up.  Reportedly, patient is very sensitive to insulin.  Last A1c 6.6% in 03/2020. Recent Labs  Lab 01/24/21 2115 01/25/21 0032 01/25/21 0501 01/25/21 0847 01/25/21 1155  GLUCAP 118* 105* 80 158* 257*  -  Continue basal insulin 4 units nightly -Increase SSI to sensitive.  No nightly coverage. -Liberated diet to regular due to poor p.o. intake   Acute metabolic encephalopathy: Likely due to the above.  No focal neuro deficit.  CT head negative.  Resolved. -Treat treatable causes -Reorientation and delirium precautions.   Orthostatic hypotension: Multifactorial including Parkinson's disease, uncontrolled diabetes, alcohol abuse and acute illness.  Carotid US without significant  finding.  A.m. cortisol 9.6.  TSH 7.3.  Free T4 1.0.  On Florinef and midodrine at home. -Increased midodrine to 5 mg 3 times daily on 9/16 -Elevate head of bed to 45 degree -Continue TED hose -Orthostatic vitals daily -Follow TTE -Abdominal binder if no improvement with the above measures.   Parkinson disease (HCC) -Continue Sinemet   COPD (chronic obstructive pulmonary disease: No respiratory symptoms. -Continue with Bronchodilators  Chronic pancreatic insufficiency -Continue with Creon  Hyponatremia/hypokalemia/hypomagnesemia/hypophosphatemia-resolved.  Hypocalcemia: Corrects to normal for hypoalbuminemia.  Normocytic anemia: H&H stable.  Anemia panel basically normal. Recent Labs    03/22/20 0122 03/23/20 0504 03/24/20 0513 11/28/20 1706 01/20/21 1013 01/21/21 0453 01/22/21 0721 01/23/21 0649 01/24/21 0546 01/25/21 0450  HGB 10.7* 9.5* 9.7* 11.0* 11.5* 9.8* 8.7* 10.0* 9.1* 9.1*  -Monitor H&H   Alcohol abuse: reportedly drinks about 42 ounce of beers a day.  No withdrawal symptoms yet. -Encouraged alcohol cessation -Continue with CIWA protocol -Continue multivitamin, thiamine and folic acid  Physical deconditioning/debility -PT/OT  Chronic back pain/thoracic compression fracture status post kyphoplasty: Improved -As needed Tylenol  Severe protein calorie malnutrition: As evidenced by low BMI, significant weight loss and significant muscle mass and subcu fat loss. Likely due to diabetes, pancreatic insufficiency, alcohol abuse and other chronic illness.  Poor p.o. intake.  -Liberate diet Body mass index is 18.38 kg/m. Nutrition Problem: Severe Malnutrition Etiology: social / environmental circumstances (etoh abuse) Signs/Symptoms: percent weight loss, severe muscle depletion, energy intake < 75% for > or equal to 3 months (14% x 7 months) Percent weight loss: 14 % Interventions: Refer to RD note for recommendations  Goal of care counseling: significant  comorbidity as above.  Poor long-term prognosis but is still full code.  We discussed pros and cons of CPR and intubation and his low chance of recovery if we happen to be in the situation.  I recommended DNR/DNI but he prefers to remain full code.   -Palliative care consulted   DVT prophylaxis:  Place TED hose Start: 01/24/21 1127 enoxaparin (LOVENOX) injection 40 mg Start: 01/20/21 2200  Code Status: Full code Family Communication: Updated patient's wife over the phone Level of care: Med-Surg Status is: Inpatient  Remains inpatient appropriate because:Hemodynamically unstable, Ongoing diagnostic testing needed not appropriate for outpatient work up, Unsafe d/c plan, IV treatments appropriate due to intensity of illness or inability to take PO, and Inpatient level of care appropriate due to severity of illness  Dispo: The patient is from: Home              Anticipated d/c is to: SNF              Patient currently is not medically stable to d/c.   Difficult to place patient No       Consultants:  Palliative medicine   Sch Meds:  Scheduled Meds:  vitamin C  500 mg Oral Daily   carbidopa-levodopa  1 tablet Oral QHS   carbidopa-levodopa  1 tablet Oral QID   enoxaparin (LOVENOX) injection  40 mg Subcutaneous Q24H   feeding supplement  237 mL Oral TID BM   fludrocortisone  200 mcg Oral Daily   folic acid  1 mg Oral Daily   insulin aspart  0-6 Units Subcutaneous Q4H   insulin glargine-yfgn  4 Units Subcutaneous Daily   lipase/protease/amylase  24,000 Units Oral TID AC   melatonin  5 mg Oral QHS   midodrine  5 mg Oral BID   multivitamin with minerals  1 tablet Oral Daily   pantoprazole  40 mg Oral Daily   rivastigmine  1.5 mg Oral BID   sodium chloride flush  3 mL Intravenous Q12H   thiamine  100 mg Oral Daily   Or   thiamine  100 mg Intravenous Daily   Continuous Infusions:   PRN Meds:.acetaminophen, albuterol, dextromethorphan-guaiFENesin, dextrose, ondansetron  (ZOFRAN) IV  Antimicrobials: Anti-infectives (From admission, onward)    Start     Dose/Rate Route Frequency Ordered Stop   01/21/21 1200  cefTRIAXone (ROCEPHIN) 1 g in sodium chloride 0.9 % 100 mL IVPB        1 g 200 mL/hr over 30 Minutes Intravenous Every 24 hours 01/20/21 1721 01/24/21 1635   01/20/21 1900  metroNIDAZOLE (FLAGYL) IVPB 500 mg        500 mg 100 mL/hr over 60 Minutes Intravenous Every 8 hours 01/20/21 1721 01/24/21 2100   01/20/21 1230  azithromycin (ZITHROMAX) 500 mg in sodium chloride 0.9 % 250 mL IVPB  Status:  Discontinued        500 mg 250 mL/hr over 60 Minutes Intravenous Every 24 hours 01/20/21 1226 01/20/21 1815   01/20/21 1230  cefTRIAXone (ROCEPHIN) 1 g in sodium chloride 0.9 % 100 mL IVPB        1 g 200 mL/hr over 30 Minutes Intravenous  Once 01/20/21 1226 01/20/21 1514   01/20/21 1230  clindamycin (CLEOCIN) IVPB 600 mg  Status:  Discontinued        600 mg 100 mL/hr over 30 Minutes Intravenous  Once 01/20/21 1226 01/20/21 1720        I have personally reviewed the following labs and images: CBC: Recent Labs  Lab 01/20/21 1013 01/21/21 0453 01/22/21 0721 01/23/21 0649 01/24/21 0546 01/25/21 0450  WBC 6.5 10.9* 6.7 6.0 5.5  --   NEUTROABS 5.1  --   --   --   --   --   HGB 11.5* 9.8* 8.7* 10.0* 9.1* 9.1*  HCT 33.5* 28.7* 25.7* 30.0* 27.4* 28.0*  MCV 96.8 99.0 97.0 98.0 98.2  --   PLT 482* 404* 354 401* 375  --    BMP &GFR Recent Labs  Lab 01/21/21 0453 01/22/21 0721 01/23/21 0649 01/24/21 0546 01/25/21 0450  NA 131* 131* 132* 133* 135  K 3.6 3.1* 4.3 3.7 4.1  CL 105 104 105 110 109  CO2 $Re'22 23 22 'FQw$ 21* 23  GLUCOSE 81 171* 197* 153* 119*  BUN $Re'12 10 10 8 10  'qeD$ CREATININE 0.87 0.75 0.82 0.78 0.82  CALCIUM 7.4* 7.0* 7.3* 7.2* 7.6*  MG  --  1.3* 2.0 1.9 1.7  PHOS  --   --  1.8* 3.3 2.9   Estimated Creatinine Clearance: 72.8 mL/min (by C-G formula based on SCr of 0.82 mg/dL). Liver & Pancreas: Recent Labs  Lab 01/20/21 1013  01/22/21 0721 01/23/21 0649 01/24/21 0546 01/25/21 0450  AST 36  --   --   --   --   ALT 15  --   --   --   --   ALKPHOS 183*  --   --   --   --  BILITOT 0.7  --   --   --   --   PROT 5.5*  --   --   --   --   ALBUMIN 2.2* 1.4* 1.6* 1.4* 1.5*   No results for input(s): LIPASE, AMYLASE in the last 168 hours. No results for input(s): AMMONIA in the last 168 hours. Diabetic: Recent Labs    01/23/21 0649  HGBA1C 5.2   Recent Labs  Lab 01/24/21 2115 01/25/21 0032 01/25/21 0501 01/25/21 0847 01/25/21 1155  GLUCAP 118* 105* 80 158* 257*   Cardiac Enzymes: Recent Labs  Lab 01/20/21 1013 01/23/21 0649  CKTOTAL 19* 13*   No results for input(s): PROBNP in the last 8760 hours. Coagulation Profile: No results for input(s): INR, PROTIME in the last 168 hours. Thyroid Function Tests: Recent Labs    01/25/21 0450  TSH 7.323*  FREET4 1.01   Lipid Profile: No results for input(s): CHOL, HDL, LDLCALC, TRIG, CHOLHDL, LDLDIRECT in the last 72 hours. Anemia Panel: Recent Labs    01/24/21 0546  VITAMINB12 841  FOLATE 28.0  FERRITIN 240  TIBC NOT CALCULATED  IRON 36*  RETICCTPCT 2.4   Urine analysis:    Component Value Date/Time   COLORURINE YELLOW (A) 01/21/2021 0137   APPEARANCEUR CLEAR (A) 01/21/2021 0137   LABSPEC 1.040 (H) 01/21/2021 0137   PHURINE 6.0 01/21/2021 0137   GLUCOSEU 150 (A) 01/21/2021 0137   HGBUR NEGATIVE 01/21/2021 0137   BILIRUBINUR NEGATIVE 01/21/2021 0137   KETONESUR 20 (A) 01/21/2021 0137   PROTEINUR NEGATIVE 01/21/2021 0137   NITRITE NEGATIVE 01/21/2021 0137   LEUKOCYTESUR NEGATIVE 01/21/2021 0137   Sepsis Labs: Invalid input(s): PROCALCITONIN, [place]  Microbiology: Recent Results (from the past 240 hour(s))  Resp Panel by RT-PCR (Flu A&B, Covid) Nasopharyngeal Swab     Status: None   Collection Time: 01/20/21 10:17 AM   Specimen: Nasopharyngeal Swab; Nasopharyngeal(NP) swabs in vial transport medium  Result Value Ref Range  Status   SARS Coronavirus 2 by RT PCR NEGATIVE NEGATIVE Final    Comment: (NOTE) SARS-CoV-2 target nucleic acids are NOT DETECTED.  The SARS-CoV-2 RNA is generally detectable in upper respiratory specimens during the acute phase of infection. The lowest concentration of SARS-CoV-2 viral copies this assay can detect is 138 copies/mL. A negative result does not preclude SARS-Cov-2 infection and should not be used as the sole basis for treatment or other patient management decisions. A negative result may occur with  improper specimen collection/handling, submission of specimen other than nasopharyngeal swab, presence of viral mutation(s) within the areas targeted by this assay, and inadequate number of viral copies(<138 copies/mL). A negative result must be combined with clinical observations, patient history, and epidemiological information. The expected result is Negative.  Fact Sheet for Patients:  EntrepreneurPulse.com.au  Fact Sheet for Healthcare Providers:  IncredibleEmployment.be  This test is no t yet approved or cleared by the [place] FDA and  has been authorized for detection and/or diagnosis of SARS-CoV-2 by FDA under an Emergency Use Authorization (EUA). This EUA will remain  in effect (meaning this test can be used) for the duration of the COVID-19 declaration under Section 564(b)(1) of the Act, 21 U.S.C.section 360bbb-3(b)(1), unless the authorization is terminated  or revoked sooner.       Influenza A by PCR NEGATIVE NEGATIVE Final   Influenza B by PCR NEGATIVE NEGATIVE Final    Comment: (NOTE) The Xpert Xpress SARS-CoV-2/FLU/RSV plus assay is intended as an aid in the diagnosis of influenza from Nasopharyngeal  swab specimens and should not be used as a sole basis for treatment. Nasal washings and aspirates are unacceptable for Xpert Xpress SARS-CoV-2/FLU/RSV testing.  Fact Sheet for  Patients: EntrepreneurPulse.com.au  Fact Sheet for Healthcare Providers: IncredibleEmployment.be  This test is not yet approved or cleared by the [place] FDA and has been authorized for detection and/or diagnosis of SARS-CoV-2 by FDA under an Emergency Use Authorization (EUA). This EUA will remain in effect (meaning this test can be used) for the duration of the COVID-19 declaration under Section 564(b)(1) of the Act, 21 U.S.C. section 360bbb-3(b)(1), unless the authorization is terminated or revoked.  Performed at [facility], [address] [place], [place] [address]   Blood culture (routine x 2)     Status: None   Collection Time: 01/20/21 12:50 PM   Specimen: BLOOD  Result Value Ref Range Status   Specimen Description BLOOD BLOOD LEFT FOREARM  Final   Special Requests   Final    BOTTLES DRAWN AEROBIC AND ANAEROBIC Blood Culture adequate volume   Culture   Final    NO GROWTH 5 DAYS Performed at [facility], [address], [place], [place] [address]    Report Status 01/25/2021 FINAL  Final  Blood culture (routine x 2)     Status: None   Collection Time: 01/20/21  1:12 PM   Specimen: BLOOD  Result Value Ref Range Status   Specimen Description BLOOD RIGHT ANTECUBITAL  Final   Special Requests   Final    BOTTLES DRAWN AEROBIC AND ANAEROBIC Blood Culture adequate volume   Culture   Final    NO GROWTH 5 DAYS Performed at [facility], [address], [place], [place] [address]    Report Status 01/25/2021 FINAL  Final    Radiology Studies: No results found.   Miking Usrey T. Oelwein  If 7PM-7AM, please contact night-coverage [email or web address] 01/25/2021, 3:13 PM

## 2021-01-26 ENCOUNTER — Inpatient Hospital Stay (HOSPITAL_COMMUNITY)
Admit: 2021-01-26 | Discharge: 2021-01-26 | Disposition: A | Discharge status: Home / Self Care | Payer: Medicare Other | Attending: Student | Admitting: Student

## 2021-01-26 DIAGNOSIS — I951 Orthostatic hypotension: Secondary | ICD-10-CM | POA: Diagnosis not present

## 2021-01-26 LAB — RENAL FUNCTION PANEL
Albumin: 2.1 g/dL — ABNORMAL LOW (ref 3.5–5.0)
Anion gap: 5 (ref 5–15)
BUN: 8 mg/dL (ref 8–23)
CO2: 25 mmol/L (ref 22–32)
Calcium: 7.7 mg/dL — ABNORMAL LOW (ref 8.9–10.3)
Chloride: 105 mmol/L (ref 98–111)
Creatinine, Ser: 0.7 mg/dL (ref 0.61–1.24)
GFR, Estimated: >60 mL/min (ref >60)
Glucose, Bld: 163 mg/dL — ABNORMAL HIGH (ref 70–99)
Phosphorus: 2.9 mg/dL (ref 2.5–4.6)
Potassium: 4.1 mmol/L (ref 3.5–5.1)
Sodium: 135 mmol/L (ref 135–145)

## 2021-01-26 LAB — ECHOCARDIOGRAM COMPLETE
AR max vel: 2.14 cm2
AV Peak grad: 8.4 mmHg
Ao pk vel: 1.45 m/s
Area-P 1/2: 3.93 cm2
Height: 70 in
S' Lateral: 2.3 cm
Weight: 2049.4 oz

## 2021-01-26 LAB — GLUCOSE, CAPILLARY
Glucose-Capillary: 162 mg/dL — ABNORMAL HIGH (ref 70–99)
Glucose-Capillary: 242 mg/dL — ABNORMAL HIGH (ref 70–99)
Glucose-Capillary: 200 mg/dL — ABNORMAL HIGH (ref 70–99)
Glucose-Capillary: 128 mg/dL — ABNORMAL HIGH (ref 70–99)
Glucose-Capillary: 106 mg/dL — ABNORMAL HIGH (ref 70–99)

## 2021-01-26 LAB — CBC
HCT: 27.4 % — ABNORMAL LOW (ref 39.0–52.0)
Hemoglobin: 8.8 g/dL — ABNORMAL LOW (ref 13.0–17.0)
MCH: 31.9 pg (ref 26.0–34.0)
MCHC: 32.1 g/dL (ref 30.0–36.0)
MCV: 99.3 fL (ref 80.0–100.0)
Platelets: 332 10*3/uL (ref 150–400)
RBC: 2.76 MIL/uL — ABNORMAL LOW (ref 4.22–5.81)
RDW: 14.4 % (ref 11.5–15.5)
WBC: 5 10*3/uL (ref 4.0–10.5)
nRBC: 0 % (ref 0.0–0.2)

## 2021-01-26 LAB — CK: Total CK: 10 U/L — ABNORMAL LOW (ref 49–397)

## 2021-01-26 LAB — CORTISOL-AM, BLOOD: Cortisol - AM: 12.8 ug/dL (ref 6.7–22.6)

## 2021-01-26 LAB — MAGNESIUM: Magnesium: 1.4 mg/dL — ABNORMAL LOW (ref 1.7–2.4)

## 2021-01-26 MED ORDER — FUROSEMIDE 10 MG/ML IJ SOLN
40.0000 mg | Freq: Once | INTRAMUSCULAR | Status: AC
  Administered 2021-01-26: 40 mg via INTRAVENOUS
  Filled 2021-01-26: qty 4

## 2021-01-26 MED ORDER — INSULIN ASPART 100 UNIT/ML IJ SOLN
0.0000 [IU] | Freq: Three times a day (TID) | INTRAMUSCULAR | Status: DC
  Administered 2021-01-26: 1 [IU] via SUBCUTANEOUS
  Administered 2021-01-27: 2 [IU] via SUBCUTANEOUS
  Administered 2021-01-27: 5 [IU] via SUBCUTANEOUS
  Administered 2021-01-27: 3 [IU] via SUBCUTANEOUS
  Administered 2021-01-28 (×2): 2 [IU] via SUBCUTANEOUS
  Filled 2021-01-26 (×6): qty 1

## 2021-01-26 MED ORDER — ALBUMIN HUMAN 25 % IV SOLN
25.0000 g | Freq: Four times a day (QID) | INTRAVENOUS | Status: AC
  Administered 2021-01-26 (×2): 25 g via INTRAVENOUS
  Filled 2021-01-26 (×2): qty 100

## 2021-01-26 MED ORDER — INSULIN ASPART 100 UNIT/ML IJ SOLN
2.0000 [IU] | Freq: Three times a day (TID) | INTRAMUSCULAR | Status: DC
  Administered 2021-01-26 – 2021-01-27 (×3): 2 [IU] via SUBCUTANEOUS
  Filled 2021-01-26 (×3): qty 1

## 2021-01-26 MED ORDER — MAGNESIUM SULFATE 4 GM/100ML IV SOLN
4.0000 g | Freq: Once | INTRAVENOUS | Status: AC
  Administered 2021-01-26: 4 g via INTRAVENOUS
  Filled 2021-01-26: qty 100

## 2021-01-26 NOTE — Progress Notes (Signed)
Echocardiogram Completed   John Reilly Joni Colegrove  01-26-21

## 2021-01-26 NOTE — Progress Notes (Signed)
PROGRESS NOTE  John Reilly LXB:[id number] DOB: 12-23-1955   PCP: Adin Hector, MD  Patient is from: Home.  Lives with his wife.  Uses walker at baseline.  DOA: 01/20/2021 LOS: 6  Chief complaints:  Chief Complaint  Patient presents with   Hypoglycemia     Brief Narrative / Interim history: 65 year old M with PMH of DM-1 on insulin pump, Parkinson disease, COPD not on oxygen, HTN, GERD, EtOH abuse, pancreatic insufficiency and debility brought to ED by EMS due to altered mental status and hypoglycemia to 20.  Reportedly drinks about 42 ounce of beer daily.  Hypoglycemia resolved after dextrose and glucagon by EMS.  Per diabetic coordinator, hypoglycemia felt to be he is expired Dexcom sensor.  He was admitted for acute metabolic encephalopathy in the setting of hypoglycemia and possible aspiration pneumonia. He was also very orthostatic.   Completed antibiotic course for possible aspiration pneumonia.  Encephalopathy and hypoglycemia resolved.  Remains deconditioned with poor p.o. intake and significant orthostasis.  Subjective: Seen and examined earlier this morning.  No major events overnight of this morning.  Reports back pain from being in bed.  He denies chest pain, dyspnea, GI or UTI symptoms.  Reports making a lot of urine after IV Lasix.  Objective: Vitals:   01/25/21 2019 01/25/21 2343 01/26/21 0745 01/26/21 1148  BP: 130/68 (!) 151/68 119/65 (!) 112/58  Pulse: 78 70 92 81  Resp: '17 16 18 17  ' Temp: 98.3 F (36.8 C) 98.2 F (36.8 C) 97.8 F (36.6 C) 97.7 F (36.5 C)  TempSrc: Oral Oral    SpO2: 100% 100% 98% 97%  Weight:      Height:        Intake/Output Summary (Last 24 hours) at 01/26/2021 1507 Last data filed at 01/26/2021 1400 Gross per 24 hour  Intake 328.11 ml  Output 2350 ml  Net -2021.89 ml   Filed Weights   01/20/21 1009 01/20/21 1703 01/22/21 0441  Weight: 59 kg 59.7 kg 58.1 kg    Examination:  GENERAL: Frail looking elderly male.  No  apparent distress. HEENT: MMM.  Vision and hearing grossly intact.  NECK: Supple.  No apparent JVD.  RESP:  No IWOB.  Fair aeration bilaterally. CVS:  RRR. Heart sounds normal.  ABD/GI/GU: BS+. Abd soft, NTND.  MSK/EXT:  Moves extremities. No apparent deformity.  Tenderness over BLE.  Trace dependent edema SKIN: no apparent skin lesion or wound NEURO: Awake and alert. Oriented appropriately.  No apparent focal neuro deficit. PSYCH: Calm. Normal affect.   Procedures:  None  Microbiology summarized: CBULA-45 and influenza PCR nonreactive. Blood cultures NGTD  Assessment & Plan: Severe sepsis in the setting of aspiration pneumonia: POA.  Met criteria with tachycardia, tachypnea, hypotension and encephalopathy.  CXR concerning for RLL infiltrate.  Procalcitonin elevated.  Sepsis physiology resolved.  Blood cultures NGTD. -Completed 5 days of CTX and Flagyl on 9/16. -Incentive spirometry, OOB, PT/OT -Appreciate recommendation by SLP-regular diet with more mechanical soft consistency   Uncontrolled DM-1 with hypoglycemia: On insulin pump at home.  Was hypoglycemic to 20 at home when EMS picked him up.  Reportedly, patient is very sensitive to insulin.  Last A1c 6.6% in 03/2020. Recent Labs  Lab 01/25/21 1646 01/25/21 2039 01/26/21 0535 01/26/21 0749 01/26/21 1151  GLUCAP 112* 123* 162* 242* 200*  -Continue basal insulin 4 units nightly -Continue SSI-sensitive -Add mealtime coverage at 2 units -Patient is very sensitive to insulin.  Would be cautious on adjusting. -Liberated diet  to regular due to poor p.o. intake   Acute metabolic encephalopathy: Likely due to the above. Resolved. -Treat treatable causes -Reorientation and delirium precautions.   Orthostatic hypotension: Multifactorial including Parkinson's disease, uncontrolled diabetes, alcohol abuse and acute illness.  Carotid US without significant finding.  A.m. cortisol 9.6>> 12.8.  TSH 7.3.  Free T4 1.0.  On Florinef and  midodrine at home. -Increased midodrine to 5 mg 3 times daily on 9/16 -Elevate head of bed to 45 degree -Continue TED hose -Orthostatic vitals daily -Follow TTE -Abdominal binder if no improvement with the above measures.  Extremity edema: Likely from malnutrition and hypoalbuminemia.  Improved with IV diuretics in combination with albumin. -Give additional dose of albumin and IV Lasix today -Closely monitor urine output -Follow TTE   Parkinson disease (HCC) -Continue Sinemet   COPD (chronic obstructive pulmonary disease: No respiratory symptoms. -Continue with Bronchodilators  Chronic pancreatic insufficiency -Continue with Creon  Hyponatremia/hypokalemia/hypomagnesemia/hypophosphatemia-Mg 1.4. -IV magnesium sulfate 4 g x 1  Hypocalcemia: Corrects to normal for hypoalbuminemia.  Normocytic anemia: H&H stable.  Anemia panel basically normal. Recent Labs    03/23/20 0504 03/24/20 0513 11/28/20 1706 01/20/21 1013 01/21/21 0453 01/22/21 0721 01/23/21 0649 01/24/21 0546 01/25/21 0450 01/26/21 0434  HGB 9.5* 9.7* 11.0* 11.5* 9.8* 8.7* 10.0* 9.1* 9.1* 8.8*  -Monitor H&H   Alcohol abuse: reportedly drinks about 42 ounce of beers a day.  No withdrawal symptoms yet. -Encouraged alcohol cessation -Continue with CIWA protocol -Continue multivitamin, thiamine and folic acid  Physical deconditioning/debility -PT/OT  Chronic back pain/thoracic compression fracture status post kyphoplasty: Improved -As needed Tylenol  Severe protein calorie malnutrition: As evidenced by low BMI, significant weight loss and significant muscle mass and subcu fat loss. Likely due to diabetes, pancreatic insufficiency, alcohol abuse and other chronic illness.  Poor p.o. intake.  -Liberate diet Body mass index is 18.38 kg/m. Nutrition Problem: Severe Malnutrition Etiology: social / environmental circumstances (etoh abuse) Signs/Symptoms: percent weight loss, severe muscle depletion, energy  intake < 75% for > or equal to 3 months (14% x 7 months) Percent weight loss: 14 % Interventions: Refer to RD note for recommendations  Goal of care counseling: significant comorbidity as above.  Poor long-term prognosis but is still full code.  We discussed pros and cons of CPR and intubation and his low chance of recovery if we happen to be in the situation.  I recommended DNR/DNI but he prefers to remain full code.   -Palliative care consulted   DVT prophylaxis:  Place TED hose Start: 01/24/21 1127 enoxaparin (LOVENOX) injection 40 mg Start: 01/20/21 2200  Code Status: Full code Family Communication: Updated patient's wife over the phone on 9/16.  None at bedside today. Level of care: Med-Surg Status is: Inpatient  Remains inpatient appropriate because:Hemodynamically unstable, Ongoing diagnostic testing needed not appropriate for outpatient work up, Unsafe d/c plan, IV treatments appropriate due to intensity of illness or inability to take PO, and Inpatient level of care appropriate due to severity of illness  Dispo: The patient is from: Home              Anticipated d/c is to: SNF              Patient currently is not medically stable to d/c.   Difficult to place patient No       Consultants:  Palliative medicine   Sch Meds:  Scheduled Meds:  vitamin C  500 mg Oral Daily   carbidopa-levodopa  1 tablet Oral QHS  carbidopa-levodopa  1 tablet Oral QID   enoxaparin (LOVENOX) injection  40 mg Subcutaneous Q24H   feeding supplement  237 mL Oral TID BM   fludrocortisone  200 mcg Oral Daily   folic acid  1 mg Oral Daily   insulin aspart  0-9 Units Subcutaneous TID WC   insulin aspart  2 Units Subcutaneous TID WC   insulin glargine-yfgn  4 Units Subcutaneous Daily   lipase/protease/amylase  24,000 Units Oral TID AC   melatonin  5 mg Oral QHS   midodrine  5 mg Oral BID   multivitamin with minerals  1 tablet Oral Daily   pantoprazole  40 mg Oral Daily   rivastigmine  1.5  mg Oral BID   sodium chloride flush  3 mL Intravenous Q12H   thiamine  100 mg Oral Daily   Or   thiamine  100 mg Intravenous Daily   Continuous Infusions:  albumin human 25 g (01/26/21 0834)    PRN Meds:.acetaminophen, albuterol, dextromethorphan-guaiFENesin, dextrose, ondansetron (ZOFRAN) IV  Antimicrobials: Anti-infectives (From admission, onward)    Start     Dose/Rate Route Frequency Ordered Stop   01/21/21 1200  cefTRIAXone (ROCEPHIN) 1 g in sodium chloride 0.9 % 100 mL IVPB        1 g 200 mL/hr over 30 Minutes Intravenous Every 24 hours 01/20/21 1721 01/24/21 1635   01/20/21 1900  metroNIDAZOLE (FLAGYL) IVPB 500 mg        500 mg 100 mL/hr over 60 Minutes Intravenous Every 8 hours 01/20/21 1721 01/24/21 2100   01/20/21 1230  azithromycin (ZITHROMAX) 500 mg in sodium chloride 0.9 % 250 mL IVPB  Status:  Discontinued        500 mg 250 mL/hr over 60 Minutes Intravenous Every 24 hours 01/20/21 1226 01/20/21 1815   01/20/21 1230  cefTRIAXone (ROCEPHIN) 1 g in sodium chloride 0.9 % 100 mL IVPB        1 g 200 mL/hr over 30 Minutes Intravenous  Once 01/20/21 1226 01/20/21 1514   01/20/21 1230  clindamycin (CLEOCIN) IVPB 600 mg  Status:  Discontinued        600 mg 100 mL/hr over 30 Minutes Intravenous  Once 01/20/21 1226 01/20/21 1720        I have personally reviewed the following labs and images: CBC: Recent Labs  Lab 01/20/21 1013 01/21/21 0453 01/22/21 0721 01/23/21 0649 01/24/21 0546 01/25/21 0450 01/26/21 0434  WBC 6.5 10.9* 6.7 6.0 5.5  --  5.0  NEUTROABS 5.1  --   --   --   --   --   --   HGB 11.5* 9.8* 8.7* 10.0* 9.1* 9.1* 8.8*  HCT 33.5* 28.7* 25.7* 30.0* 27.4* 28.0* 27.4*  MCV 96.8 99.0 97.0 98.0 98.2  --  99.3  PLT 482* 404* 354 401* 375  --  332   BMP &GFR Recent Labs  Lab 01/22/21 0721 01/23/21 0649 01/24/21 0546 01/25/21 0450 01/26/21 0434  NA 131* 132* 133* 135 135  K 3.1* 4.3 3.7 4.1 4.1  CL 104 105 110 109 105  CO2 23 22 21* 23 25   GLUCOSE 171* 197* 153* 119* 163*  BUN '10 10 8 10 8  ' CREATININE 0.75 0.82 0.78 0.82 0.70  CALCIUM 7.0* 7.3* 7.2* 7.6* 7.7*  MG 1.3* 2.0 1.9 1.7 1.4*  PHOS  --  1.8* 3.3 2.9 2.9   Estimated Creatinine Clearance: 74.6 mL/min (by C-G formula based on SCr of 0.7 mg/dL). Liver & Pancreas: Recent Labs  Lab 01/20/21 1013 01/22/21 0721 01/23/21 0649 01/24/21 0546 01/25/21 0450 01/26/21 0434  AST 36  --   --   --   --   --   ALT 15  --   --   --   --   --   ALKPHOS 183*  --   --   --   --   --   BILITOT 0.7  --   --   --   --   --   PROT 5.5*  --   --   --   --   --   ALBUMIN 2.2* 1.4* 1.6* 1.4* 1.5* 2.1*   No results for input(s): LIPASE, AMYLASE in the last 168 hours. No results for input(s): AMMONIA in the last 168 hours. Diabetic: No results for input(s): HGBA1C in the last 72 hours.  Recent Labs  Lab 01/25/21 1646 01/25/21 2039 01/26/21 0535 01/26/21 0749 01/26/21 1151  GLUCAP 112* 123* 162* 242* 200*   Cardiac Enzymes: Recent Labs  Lab 01/20/21 1013 01/23/21 0649 01/26/21 0434  CKTOTAL 19* 13* 10*   No results for input(s): PROBNP in the last 8760 hours. Coagulation Profile: No results for input(s): INR, PROTIME in the last 168 hours. Thyroid Function Tests: Recent Labs    01/25/21 0450  TSH 7.323*  FREET4 1.01   Lipid Profile: No results for input(s): CHOL, HDL, LDLCALC, TRIG, CHOLHDL, LDLDIRECT in the last 72 hours. Anemia Panel: Recent Labs    01/24/21 0546  VITAMINB12 841  FOLATE 28.0  FERRITIN 240  TIBC NOT CALCULATED  IRON 36*  RETICCTPCT 2.4   Urine analysis:    Component Value Date/Time   COLORURINE YELLOW (A) 01/21/2021 0137   APPEARANCEUR CLEAR (A) 01/21/2021 0137   LABSPEC 1.040 (H) 01/21/2021 0137   PHURINE 6.0 01/21/2021 0137   GLUCOSEU 150 (A) 01/21/2021 0137   HGBUR NEGATIVE 01/21/2021 0137   BILIRUBINUR NEGATIVE 01/21/2021 0137   KETONESUR 20 (A) 01/21/2021 0137   PROTEINUR NEGATIVE 01/21/2021 0137   NITRITE NEGATIVE  01/21/2021 0137   LEUKOCYTESUR NEGATIVE 01/21/2021 0137   Sepsis Labs: Invalid input(s): PROCALCITONIN, [place]  Microbiology: Recent Results (from the past 240 hour(s))  Resp Panel by RT-PCR (Flu A&B, Covid) Nasopharyngeal Swab     Status: None   Collection Time: 01/20/21 10:17 AM   Specimen: Nasopharyngeal Swab; Nasopharyngeal(NP) swabs in vial transport medium  Result Value Ref Range Status   SARS Coronavirus 2 by RT PCR NEGATIVE NEGATIVE Final    Comment: (NOTE) SARS-CoV-2 target nucleic acids are NOT DETECTED.  The SARS-CoV-2 RNA is generally detectable in upper respiratory specimens during the acute phase of infection. The lowest concentration of SARS-CoV-2 viral copies this assay can detect is 138 copies/mL. A negative result does not preclude SARS-Cov-2 infection and should not be used as the sole basis for treatment or other patient management decisions. A negative result may occur with  improper specimen collection/handling, submission of specimen other than nasopharyngeal swab, presence of viral mutation(s) within the areas targeted by this assay, and inadequate number of viral copies(<138 copies/mL). A negative result must be combined with clinical observations, patient history, and epidemiological information. The expected result is Negative.  Fact Sheet for Patients:  EntrepreneurPulse.com.au  Fact Sheet for Healthcare Providers:  IncredibleEmployment.be  This test is no t yet approved or cleared by the [place] FDA and  has been authorized for detection and/or diagnosis of SARS-CoV-2 by FDA under an Emergency Use Authorization (EUA). This EUA will remain  in  effect (meaning this test can be used) for the duration of the COVID-19 declaration under Section 564(b)(1) of the Act, 21 U.S.C.section 360bbb-3(b)(1), unless the authorization is terminated  or revoked sooner.       Influenza A by PCR NEGATIVE NEGATIVE Final    Influenza B by PCR NEGATIVE NEGATIVE Final    Comment: (NOTE) The Xpert Xpress SARS-CoV-2/FLU/RSV plus assay is intended as an aid in the diagnosis of influenza from Nasopharyngeal swab specimens and should not be used as a sole basis for treatment. Nasal washings and aspirates are unacceptable for Xpert Xpress SARS-CoV-2/FLU/RSV testing.  Fact Sheet for Patients: EntrepreneurPulse.com.au  Fact Sheet for Healthcare Providers: IncredibleEmployment.be  This test is not yet approved or cleared by the [place] FDA and has been authorized for detection and/or diagnosis of SARS-CoV-2 by FDA under an Emergency Use Authorization (EUA). This EUA will remain in effect (meaning this test can be used) for the duration of the COVID-19 declaration under Section 564(b)(1) of the Act, 21 U.S.C. section 360bbb-3(b)(1), unless the authorization is terminated or revoked.  Performed at [facility], [address] [place], [place] [address]   Blood culture (routine x 2)     Status: None   Collection Time: 01/20/21 12:50 PM   Specimen: BLOOD  Result Value Ref Range Status   Specimen Description BLOOD BLOOD LEFT FOREARM  Final   Special Requests   Final    BOTTLES DRAWN AEROBIC AND ANAEROBIC Blood Culture adequate volume   Culture   Final    NO GROWTH 5 DAYS Performed at [facility], [address] [place], [place] [address]    Report Status 01/25/2021 FINAL  Final  Blood culture (routine x 2)     Status: None   Collection Time: 01/20/21  1:12 PM   Specimen: BLOOD  Result Value Ref Range Status   Specimen Description BLOOD RIGHT ANTECUBITAL  Final   Special Requests   Final    BOTTLES DRAWN AEROBIC AND ANAEROBIC Blood Culture adequate volume   Culture   Final    NO GROWTH 5 DAYS Performed at [facility], [address], [place], [place] [address]    Report Status 01/25/2021 FINAL  Final    Radiology Studies: No  results found.   John Reilly T. Milaca  If 7PM-7AM, please contact night-coverage [email or web address] 01/26/2021, 3:07 PM

## 2021-01-27 LAB — RENAL FUNCTION PANEL
Albumin: 2.5 g/dL — ABNORMAL LOW (ref 3.5–5.0)
Anion gap: 9 (ref 5–15)
BUN: 7 mg/dL — ABNORMAL LOW (ref 8–23)
CO2: 23 mmol/L (ref 22–32)
Calcium: 7.8 mg/dL — ABNORMAL LOW (ref 8.9–10.3)
Chloride: 102 mmol/L (ref 98–111)
Creatinine, Ser: 0.89 mg/dL (ref 0.61–1.24)
GFR, Estimated: >60 mL/min (ref >60)
Glucose, Bld: 234 mg/dL — ABNORMAL HIGH (ref 70–99)
Phosphorus: 2.5 mg/dL (ref 2.5–4.6)
Potassium: 2.8 mmol/L — ABNORMAL LOW (ref 3.5–5.1)
Sodium: 134 mmol/L — ABNORMAL LOW (ref 135–145)

## 2021-01-27 LAB — GLUCOSE, CAPILLARY
Glucose-Capillary: 128 mg/dL — ABNORMAL HIGH (ref 70–99)
Glucose-Capillary: 134 mg/dL — ABNORMAL HIGH (ref 70–99)
Glucose-Capillary: 152 mg/dL — ABNORMAL HIGH (ref 70–99)
Glucose-Capillary: 242 mg/dL — ABNORMAL HIGH (ref 70–99)
Glucose-Capillary: 273 mg/dL — ABNORMAL HIGH (ref 70–99)

## 2021-01-27 LAB — HEMOGLOBIN AND HEMATOCRIT, BLOOD
HCT: 27.4 % — ABNORMAL LOW (ref 39.0–52.0)
Hemoglobin: 9.3 g/dL — ABNORMAL LOW (ref 13.0–17.0)

## 2021-01-27 LAB — MAGNESIUM: Magnesium: 2.1 mg/dL (ref 1.7–2.4)

## 2021-01-27 MED ORDER — ENSURE ENLIVE PO LIQD: 237.0000 mL | ORAL | Status: DC

## 2021-01-27 MED ORDER — INSULIN GLARGINE-YFGN 100 UNIT/ML ~~LOC~~ SOLN
6.0000 [IU] | Freq: Every day | SUBCUTANEOUS | Status: DC
  Administered 2021-01-28: 6 [IU] via SUBCUTANEOUS
  Filled 2021-01-27 (×2): qty 0.06

## 2021-01-27 MED ORDER — POTASSIUM CHLORIDE CRYS ER 20 MEQ PO TBCR
40.0000 meq | EXTENDED_RELEASE_TABLET | ORAL | Status: AC
  Administered 2021-01-27 (×3): 40 meq via ORAL
  Filled 2021-01-27 (×3): qty 2

## 2021-01-27 MED ORDER — INSULIN ASPART 100 UNIT/ML IJ SOLN
3.0000 [IU] | Freq: Three times a day (TID) | INTRAMUSCULAR | Status: DC
Start: 2021-01-27 — End: 2021-01-28
  Administered 2021-01-27 – 2021-01-28 (×3): 3 [IU] via SUBCUTANEOUS
  Filled 2021-01-27 (×3): qty 1

## 2021-01-27 MED ORDER — MIDODRINE HCL 5 MG PO TABS
5.0000 mg | ORAL_TABLET | Freq: Three times a day (TID) | ORAL | Status: DC
  Administered 2021-01-27 – 2021-01-28 (×3): 5 mg via ORAL
  Filled 2021-01-27 (×3): qty 1

## 2021-01-27 NOTE — Progress Notes (Signed)
PROGRESS NOTE  John Reilly HRC:[id number] DOB: 09-Oct-1955   PCP: Adin Hector, MD  Patient is from: Home.  Lives with his wife.  Uses walker at baseline.  DOA: 01/20/2021 LOS: 7  Chief complaints:  Chief Complaint  Patient presents with   Hypoglycemia     Brief Narrative / Interim history: 65 year old M with PMH of DM-1 on insulin pump, Parkinson disease, COPD not on oxygen, HTN, GERD, EtOH abuse, pancreatic insufficiency and debility brought to ED by EMS due to altered mental status and hypoglycemia to 20.  Reportedly drinks about 42 ounce of beer daily.  Hypoglycemia resolved after dextrose and glucagon by EMS.  Per diabetic coordinator, hypoglycemia felt to be he is expired Dexcom sensor.  He was admitted for acute metabolic encephalopathy in the setting of hypoglycemia and possible aspiration pneumonia. He was also very orthostatic.   Completed antibiotic course for possible aspiration pneumonia.  Encephalopathy and hypoglycemia resolved.  Remains deconditioned with poor p.o. intake and significant orthostasis.  Subjective: Seen and examined earlier this morning.  No major events overnight of this morning.  Soft blood pressure this morning.  No complaints.  He denies chest pain, shortness of breath, GI or UTI symptoms.  He says his appetite is getting better.  Objective: Vitals:   01/26/21 2324 01/27/21 0444 01/27/21 0740 01/27/21 1107  BP: 130/67 (!) 92/48 116/61 (!) 105/51  Pulse: 80 98 89 91  Resp: '16 18 17 17  ' Temp: 97.6 F (36.4 C) 97.7 F (36.5 C) 98.4 F (36.9 C) 98.5 F (36.9 C)  TempSrc:    Oral  SpO2: 97% 99% 97% 100%  Weight:  59.2 kg    Height:        Intake/Output Summary (Last 24 hours) at 01/27/2021 1441 Last data filed at 01/27/2021 1335 Gross per 24 hour  Intake 600 ml  Output 1500 ml  Net -900 ml   Filed Weights   01/20/21 1703 01/22/21 0441 01/27/21 0444  Weight: 59.7 kg 58.1 kg 59.2 kg    Examination:  GENERAL: Frail looking elderly  male.  No apparent distress. HEENT: MMM.  Vision and hearing grossly intact.  NECK: Supple.  No apparent JVD.  RESP: 99% on RA.  No IWOB.  Fair aeration bilaterally. CVS:  RRR. Heart sounds normal.  ABD/GI/GU: BS+. Abd soft, NTND.  MSK/EXT:  Moves extremities. No apparent deformity.  TED hose on BLE.  Dependent edema. SKIN: no apparent skin lesion or wound NEURO: Awake and alert. Oriented appropriately.  No apparent focal neuro deficit. PSYCH: Calm. Normal affect.   Procedures:  None  Microbiology summarized: WOEHO-12 and influenza PCR nonreactive. Blood cultures NGTD  Assessment & Plan: Severe sepsis in the setting of aspiration pneumonia: POA.  Met criteria with tachycardia, tachypnea, hypotension and encephalopathy.  CXR concerning for RLL infiltrate.  Procalcitonin elevated.  Sepsis physiology resolved.  Blood cultures NGTD. -Completed 5 days of CTX and Flagyl on 9/16. -Incentive spirometry, OOB, PT/OT -Appreciate recommendation by SLP-regular diet with more mechanical soft consistency   Uncontrolled DM-1 with hypoglycemia: On insulin pump at home.  Was hypoglycemic to 20 at home when EMS picked him up.  Reportedly, patient is very sensitive to insulin.  Last A1c 6.6% in 03/2020. Recent Labs  Lab 01/26/21 1151 01/26/21 1649 01/26/21 2037 01/27/21 0737 01/27/21 1110  GLUCAP 200* 128* 106* 273* 242*  -Increase basal insulin to 6 units nightly -Continue SSI-sensitive -Increase mealtime NovoLog to 3 units 3 times daily starting this evening -Patient is  very sensitive to insulin.  Would be cautious on adjusting. -Liberated diet to regular due to poor p.o. intake   Acute metabolic encephalopathy: Likely due to the above. Resolved. -Treat treatable causes -Reorientation and delirium precautions.   Orthostatic hypotension: Multifactorial including Parkinson's disease, uncontrolled diabetes, alcohol abuse and acute illness.  Carotid US and TTE without significant finding.  A.m.  cortisol 9.6>> 12.8.  TSH 7.3.  Free T4 1.0.  On Florinef and midodrine at home.  SBP dropped from 115 to 60s -Increase midodrine from 5 mg twice daily to 3 times daily -Elevate head of bed to 45 degree -Continue TED hose -Orthostatic vitals daily -Add abdominal binder  Extremity edema: Likely from malnutrition and hypoalbuminemia.  Improved with IV diuretics in combination with albumin. -Hold diuretics today   Parkinson disease ([place]) -Continue Sinemet   COPD (chronic obstructive pulmonary disease: No respiratory symptoms. -Continue with Bronchodilators  Chronic pancreatic insufficiency -Continue with Creon  Hyponatremia/hypokalemia/hypomagnesemia/hypophosphatemia-K2.8.  Mg 2.1. -P.o. K-Dur 40 mill equivalent x3  Hypocalcemia: Corrects to normal for hypoalbuminemia.  Normocytic anemia: H&H stable.  Anemia panel basically normal. Recent Labs    03/24/20 0513 11/28/20 1706 01/20/21 1013 01/21/21 0453 01/22/21 0721 01/23/21 0649 01/24/21 0546 01/25/21 0450 01/26/21 0434 01/27/21 0509  HGB 9.7* 11.0* 11.5* 9.8* 8.7* 10.0* 9.1* 9.1* 8.8* 9.3*  -Monitor H&H   Alcohol abuse: reportedly drinks about 42 ounce of beers a day.  No withdrawal symptoms yet. -Encouraged alcohol cessation -Discontinue CIWA monitoring.  Should be outside withdrawal window -Continue multivitamin, thiamine and folic acid  Physical deconditioning/debility -Continue PT/OT  Chronic back pain/thoracic compression fracture status post kyphoplasty: Improved -As needed Tylenol  Severe protein calorie malnutrition: As evidenced by low BMI, significant weight loss and significant muscle mass and subcu fat loss. Likely due to diabetes, pancreatic insufficiency, alcohol abuse and other chronic illness.  Poor p.o. intake.  -Liberated diet Body mass index is 18.73 kg/m. Nutrition Problem: Severe Malnutrition Etiology: social / environmental circumstances (etoh abuse) Signs/Symptoms: percent weight loss,  severe muscle depletion, energy intake < 75% for > or equal to 3 months (14% x 7 months) Percent weight loss: 14 % Interventions: Refer to RD note for recommendations  Goal of care counseling: significant comorbidity as above.  Poor long-term prognosis but is still full code.  We discussed pros and cons of CPR and intubation and his low chance of recovery if we happen to be in the situation.  I recommended DNR/DNI but he prefers to remain full code.   -Palliative care consulted   DVT prophylaxis:  Place TED hose Start: 01/24/21 1127 enoxaparin (LOVENOX) injection 40 mg Start: 01/20/21 2200  Code Status: Full code Family Communication: Patient and Therapist, sports.  No family member at bedside. Level of care: Med-Surg Status is: Inpatient  Remains inpatient appropriate because:Hemodynamically unstable, Persistent severe electrolyte disturbances, Unsafe d/c plan, and Inpatient level of care appropriate due to severity of illness  Dispo: The patient is from: Home              Anticipated d/c is to: SNF              Patient currently is not medically stable to d/c.   Difficult to place patient No       Consultants:  Palliative medicine   Sch Meds:  Scheduled Meds:  vitamin C  500 mg Oral Daily   carbidopa-levodopa  1 tablet Oral QHS   carbidopa-levodopa  1 tablet Oral QID   enoxaparin (LOVENOX) injection  40  mg Subcutaneous Q24H   feeding supplement  237 mL Oral TID BM   fludrocortisone  200 mcg Oral Daily   folic acid  1 mg Oral Daily   insulin aspart  0-9 Units Subcutaneous TID WC   insulin aspart  3 Units Subcutaneous TID WC   [START ON 01/28/2021] insulin glargine-yfgn  6 Units Subcutaneous Daily   lipase/protease/amylase  24,000 Units Oral TID AC   melatonin  5 mg Oral QHS   midodrine  5 mg Oral BID   multivitamin with minerals  1 tablet Oral Daily   pantoprazole  40 mg Oral Daily   potassium chloride  40 mEq Oral Q3H   rivastigmine  1.5 mg Oral BID   sodium chloride flush  3 mL  Intravenous Q12H   thiamine  100 mg Oral Daily   Or   thiamine  100 mg Intravenous Daily   Continuous Infusions:    PRN Meds:.acetaminophen, albuterol, dextromethorphan-guaiFENesin, dextrose, ondansetron (ZOFRAN) IV  Antimicrobials: Anti-infectives (From admission, onward)    Start     Dose/Rate Route Frequency Ordered Stop   01/21/21 1200  cefTRIAXone (ROCEPHIN) 1 g in sodium chloride 0.9 % 100 mL IVPB        1 g 200 mL/hr over 30 Minutes Intravenous Every 24 hours 01/20/21 1721 01/24/21 1635   01/20/21 1900  metroNIDAZOLE (FLAGYL) IVPB 500 mg        500 mg 100 mL/hr over 60 Minutes Intravenous Every 8 hours 01/20/21 1721 01/24/21 2100   01/20/21 1230  azithromycin (ZITHROMAX) 500 mg in sodium chloride 0.9 % 250 mL IVPB  Status:  Discontinued        500 mg 250 mL/hr over 60 Minutes Intravenous Every 24 hours 01/20/21 1226 01/20/21 1815   01/20/21 1230  cefTRIAXone (ROCEPHIN) 1 g in sodium chloride 0.9 % 100 mL IVPB        1 g 200 mL/hr over 30 Minutes Intravenous  Once 01/20/21 1226 01/20/21 1514   01/20/21 1230  clindamycin (CLEOCIN) IVPB 600 mg  Status:  Discontinued        600 mg 100 mL/hr over 30 Minutes Intravenous  Once 01/20/21 1226 01/20/21 1720        I have personally reviewed the following labs and images: CBC: Recent Labs  Lab 01/21/21 0453 01/22/21 0721 01/23/21 0649 01/24/21 0546 01/25/21 0450 01/26/21 0434 01/27/21 0509  WBC 10.9* 6.7 6.0 5.5  --  5.0  --   HGB 9.8* 8.7* 10.0* 9.1* 9.1* 8.8* 9.3*  HCT 28.7* 25.7* 30.0* 27.4* 28.0* 27.4* 27.4*  MCV 99.0 97.0 98.0 98.2  --  99.3  --   PLT 404* 354 401* 375  --  332  --    BMP &GFR Recent Labs  Lab 01/23/21 0649 01/24/21 0546 01/25/21 0450 01/26/21 0434 01/27/21 0509  NA 132* 133* 135 135 134*  K 4.3 3.7 4.1 4.1 2.8*  CL 105 110 109 105 102  CO2 22 21* '23 25 23  ' GLUCOSE 197* 153* 119* 163* 234*  BUN '10 8 10 8 ' 7*  CREATININE 0.82 0.78 0.82 0.70 0.89  CALCIUM 7.3* 7.2* 7.6* 7.7* 7.8*  MG  2.0 1.9 1.7 1.4* 2.1  PHOS 1.8* 3.3 2.9 2.9 2.5   Estimated Creatinine Clearance: 68.4 mL/min (by C-G formula based on SCr of 0.89 mg/dL). Liver & Pancreas: Recent Labs  Lab 01/23/21 0649 01/24/21 0546 01/25/21 0450 01/26/21 0434 01/27/21 0509  ALBUMIN 1.6* 1.4* 1.5* 2.1* 2.5*   No results for input(s):  LIPASE, AMYLASE in the last 168 hours. No results for input(s): AMMONIA in the last 168 hours. Diabetic: No results for input(s): HGBA1C in the last 72 hours.  Recent Labs  Lab 01/26/21 1151 01/26/21 1649 01/26/21 2037 01/27/21 0737 01/27/21 1110  GLUCAP 200* 128* 106* 273* 242*   Cardiac Enzymes: Recent Labs  Lab 01/23/21 0649 01/26/21 0434  CKTOTAL 13* 10*   No results for input(s): PROBNP in the last 8760 hours. Coagulation Profile: No results for input(s): INR, PROTIME in the last 168 hours. Thyroid Function Tests: Recent Labs    01/25/21 0450  TSH 7.323*  FREET4 1.01   Lipid Profile: No results for input(s): CHOL, HDL, LDLCALC, TRIG, CHOLHDL, LDLDIRECT in the last 72 hours. Anemia Panel: No results for input(s): VITAMINB12, FOLATE, FERRITIN, TIBC, IRON, RETICCTPCT in the last 72 hours.  Urine analysis:    Component Value Date/Time   COLORURINE YELLOW (A) 01/21/2021 0137   APPEARANCEUR CLEAR (A) 01/21/2021 0137   LABSPEC 1.040 (H) 01/21/2021 0137   PHURINE 6.0 01/21/2021 0137   GLUCOSEU 150 (A) 01/21/2021 0137   HGBUR NEGATIVE 01/21/2021 0137   BILIRUBINUR NEGATIVE 01/21/2021 0137   KETONESUR 20 (A) 01/21/2021 0137   PROTEINUR NEGATIVE 01/21/2021 0137   NITRITE NEGATIVE 01/21/2021 0137   LEUKOCYTESUR NEGATIVE 01/21/2021 0137   Sepsis Labs: Invalid input(s): PROCALCITONIN, [place]  Microbiology: Recent Results (from the past 240 hour(s))  Resp Panel by RT-PCR (Flu A&B, Covid) Nasopharyngeal Swab     Status: None   Collection Time: 01/20/21 10:17 AM   Specimen: Nasopharyngeal Swab; Nasopharyngeal(NP) swabs in vial transport medium  Result  Value Ref Range Status   SARS Coronavirus 2 by RT PCR NEGATIVE NEGATIVE Final    Comment: (NOTE) SARS-CoV-2 target nucleic acids are NOT DETECTED.  The SARS-CoV-2 RNA is generally detectable in upper respiratory specimens during the acute phase of infection. The lowest concentration of SARS-CoV-2 viral copies this assay can detect is 138 copies/mL. A negative result does not preclude SARS-Cov-2 infection and should not be used as the sole basis for treatment or other patient management decisions. A negative result may occur with  improper specimen collection/handling, submission of specimen other than nasopharyngeal swab, presence of viral mutation(s) within the areas targeted by this assay, and inadequate number of viral copies(<138 copies/mL). A negative result must be combined with clinical observations, patient history, and epidemiological information. The expected result is Negative.  Fact Sheet for Patients:  EntrepreneurPulse.com.au  Fact Sheet for Healthcare Providers:  IncredibleEmployment.be  This test is no t yet approved or cleared by the [place] FDA and  has been authorized for detection and/or diagnosis of SARS-CoV-2 by FDA under an Emergency Use Authorization (EUA). This EUA will remain  in effect (meaning this test can be used) for the duration of the COVID-19 declaration under Section 564(b)(1) of the Act, 21 U.S.C.section 360bbb-3(b)(1), unless the authorization is terminated  or revoked sooner.       Influenza A by PCR NEGATIVE NEGATIVE Final   Influenza B by PCR NEGATIVE NEGATIVE Final    Comment: (NOTE) The Xpert Xpress SARS-CoV-2/FLU/RSV plus assay is intended as an aid in the diagnosis of influenza from Nasopharyngeal swab specimens and should not be used as a sole basis for treatment. Nasal washings and aspirates are unacceptable for Xpert Xpress SARS-CoV-2/FLU/RSV testing.  Fact Sheet for  Patients: EntrepreneurPulse.com.au  Fact Sheet for Healthcare Providers: IncredibleEmployment.be  This test is not yet approved or cleared by the [place] FDA and has been  authorized for detection and/or diagnosis of SARS-CoV-2 by FDA under an Emergency Use Authorization (EUA). This EUA will remain in effect (meaning this test can be used) for the duration of the COVID-19 declaration under Section 564(b)(1) of the Act, 21 U.S.C. section 360bbb-3(b)(1), unless the authorization is terminated or revoked.  Performed at [facility], [address] [place], [place] [address]   Blood culture (routine x 2)     Status: None   Collection Time: 01/20/21 12:50 PM   Specimen: BLOOD  Result Value Ref Range Status   Specimen Description BLOOD BLOOD LEFT FOREARM  Final   Special Requests   Final    BOTTLES DRAWN AEROBIC AND ANAEROBIC Blood Culture adequate volume   Culture   Final    NO GROWTH 5 DAYS Performed at [facility], [address] [place], [place] [address]    Report Status 01/25/2021 FINAL  Final  Blood culture (routine x 2)     Status: None   Collection Time: 01/20/21  1:12 PM   Specimen: BLOOD  Result Value Ref Range Status   Specimen Description BLOOD RIGHT ANTECUBITAL  Final   Special Requests   Final    BOTTLES DRAWN AEROBIC AND ANAEROBIC Blood Culture adequate volume   Culture   Final    NO GROWTH 5 DAYS Performed at [facility], [address] [place], [place] [address]    Report Status 01/25/2021 FINAL  Final    Radiology Studies: ECHOCARDIOGRAM COMPLETE  Result Date: 01/26/2021    ECHOCARDIOGRAM REPORT   Patient Name:   IRIE FIORELLO Date of Exam: 01/26/2021 Medical Rec #:  [id number]     Height:       70.0 in Accession #:    [id number]    Weight:       128.1 lb Date of Birth:  [birth year]     BSA:          1.727 m Patient Age:    6 years      BP:           116/70 mmHg Patient Gender: M              HR:           85 bpm. Exam Location:  [facility] Procedure: 2D Echo, Cardiac Doppler and Color Doppler Indications:     Other-Full Diagnosis List                  Full ICD-10/Reason for Exam Orthostatic hypotension                  [458.0.ICD-9-CM]  History:         Patient has prior history of Echocardiogram examinations. Risk                  Factors:Diabetes and Hypertension.  Sonographer:     Alyse Low Roar Referring Phys:  [id number] [facility] Diagnosing Phys: Ida Rogue MD IMPRESSIONS  1. Left ventricular ejection fraction, by estimation, is 60 to 65%. The left ventricle has normal function. The left ventricle has no regional wall motion abnormalities. Left ventricular diastolic parameters are consistent with Grade I diastolic dysfunction (impaired relaxation).  2. Right ventricular systolic function is normal. The right ventricular size is normal.  3. The mitral valve is normal in structure. No evidence of mitral valve regurgitation. No evidence of mitral stenosis.  4. The aortic valve is normal in structure. Aortic valve regurgitation is not visualized. No aortic  stenosis is present.  5. The inferior vena cava is normal in size with greater than 50% respiratory variability, suggesting right atrial pressure of 3 mmHg. FINDINGS  Left Ventricle: Left ventricular ejection fraction, by estimation, is 60 to 65%. The left ventricle has normal function. The left ventricle has no regional wall motion abnormalities. The left ventricular internal cavity size was normal in size. There is  no left ventricular hypertrophy. Left ventricular diastolic parameters are consistent with Grade I diastolic dysfunction (impaired relaxation). Right Ventricle: The right ventricular size is normal. No increase in right ventricular wall thickness. Right ventricular systolic function is normal. Left Atrium: Left atrial size was normal in size. Right Atrium: Right atrial size was normal in size. Pericardium: There is no evidence of  pericardial effusion. Mitral Valve: The mitral valve is normal in structure. No evidence of mitral valve regurgitation. No evidence of mitral valve stenosis. Tricuspid Valve: The tricuspid valve is normal in structure. Tricuspid valve regurgitation is not demonstrated. No evidence of tricuspid stenosis. Aortic Valve: The aortic valve is normal in structure. Aortic valve regurgitation is not visualized. No aortic stenosis is present. Aortic valve peak gradient measures 8.4 mmHg. Pulmonic Valve: The pulmonic valve was normal in structure. Pulmonic valve regurgitation is not visualized. No evidence of pulmonic stenosis. Aorta: The aortic root is normal in size and structure. Venous: The inferior vena cava is normal in size with greater than 50% respiratory variability, suggesting right atrial pressure of 3 mmHg. IAS/Shunts: No atrial level shunt detected by color flow Doppler.  LEFT VENTRICLE PLAX 2D LVIDd:         3.30 cm  Diastology LVIDs:         2.30 cm  LV e' medial:    6.31 cm/s LV PW:         0.90 cm  LV E/e' medial:  10.2 LV IVS:        1.00 cm  LV e' lateral:   9.68 cm/s LVOT diam:     1.80 cm  LV E/e' lateral: 6.6 LVOT Area:     2.54 cm  RIGHT VENTRICLE RV Basal diam:  2.80 cm RV Mid diam:    2.30 cm RV S prime:     14.10 cm/s TAPSE (M-mode): 2.3 cm LEFT ATRIUM             Index       RIGHT ATRIUM           Index LA diam:        3.40 cm 1.97 cm/m  RA Area:     11.40 cm LA Vol (A2C):   36.9 ml 21.36 ml/m RA Volume:   23.50 ml  13.61 ml/m LA Vol (A4C):   25.0 ml 14.47 ml/m LA Biplane Vol: 33.3 ml 19.28 ml/m  AORTIC VALVE                PULMONIC VALVE AV Area (Vmax): 2.14 cm    PV Vmax:        1.44 m/s AV Vmax:        145.00 cm/s PV Peak grad:   8.3 mmHg AV Peak Grad:   8.4 mmHg    RVOT Peak grad: 7 mmHg LVOT Vmax:      122.00 cm/s  AORTA Ao Root diam: 3.00 cm MITRAL VALVE MV Area (PHT): 3.93 cm    SHUNTS MV Decel Time: 193 msec    Systemic Diam: 1.80 cm MV E velocity: 64.30 cm/s MV A velocity: 97.90  cm/s MV E/A ratio:  0.66 MV A Prime:    11.7 cm/s Ida Rogue MD Electronically signed by Ida Rogue MD Signature Date/Time: 01/26/2021/7:03:18 PM    Final      Ileene Allie T. [place]  If 7PM-7AM, please contact night-coverage [email or web address] 01/27/2021, 2:41 PM

## 2021-01-27 NOTE — TOC Progression Note (Signed)
Transition of Care [facility]) - Progression Note    Patient Details  Name: John Reilly MRN: [id number] Date of Birth: [birth year]  [facility] CM/SW Contact  Eileen Stanford, LCSW Phone Number: 01/27/2021, 12:58 PM  Clinical Narrative:  Per MD in rounds pt will dc to [facility] tomorrow. MD notified pt will need covid test prior to return. [facility] is aware of dc tomorrow.     Expected Discharge Plan: [place] Barriers to Discharge: Continued Medical Work up  Expected Discharge Plan and Services Expected Discharge Plan: [place] arrangements for the past 2 months: Single Family Home                                       Social Determinants of Health (SDOH) Interventions    Readmission Risk Interventions No flowsheet data found.

## 2021-01-27 NOTE — Progress Notes (Signed)
Physical Therapy Treatment Patient Details Name: John Reilly MRN: [id number] DOB: 1955/08/24 Today's Date: 01/27/2021   History of Present Illness Pt is a 65 y.o. male presenting to hospital 9/12 with concerns of hypoglycemia (pt found unresponsive in his recliner) and AMS.  Pt admitted with aspiration PNA and sepsis, hypoglycemia, acute metabolic encephalopathy, and orthostatic hypotension.  Imaging showing acute appearing and mildly displaced R lateral 5th rib fx; also chronic B rib fx R 2nd, L 4th, 5th, and 8th.  PMH includes Parkinson's disease, DM, orthostatic hypotension, COPD, htn, peripheral vascular disease, T12 compression fx s/p kyphoplasty, s/p L hip fx  sx, humeral fx 09/2017, R ankle arthroplasty.    PT Comments    Pt resting in bed upon PT arrival; agreeable to PT session.  Pt noted to be incontinent of stool in bed (nurse came to assist pt with clean up prior to therapy activity).  Pt min to mod assist with bed mobility and close SBA with sitting balance.  Pt's BP 112/53 (HR 86 bpm) semi-supine in bed beginning of session; BP decreased to 67/45 (HR 114 bpm) sitting edge of bed (pt asymptomatic); deferred further activity d/t pt reporting needing to use bed pan for bowel movement (pt assisted back to bed and onto bed pan; BP 105/51 (with HR 91 bpm) resting in bed end of session).  Will continue to focus on strengthening and progressing functional mobility during hospitalization.    Recommendations for follow up therapy are one component of a multi-disciplinary discharge planning process, led by the attending physician.  Recommendations may be updated based on patient status, additional functional criteria and insurance authorization.  Follow Up Recommendations  SNF     Equipment Recommendations  Other (comment) (pt has RW and 4ww at home already)    Recommendations for Other Services OT consult     Precautions / Restrictions Precautions Precautions: Fall Precaution  Comments: Orthostatic hypotension Restrictions Weight Bearing Restrictions: No     Mobility  Bed Mobility Overal bed mobility: Needs Assistance Bed Mobility: Supine to Sit;Sit to Supine;Rolling Rolling: Min assist (towards R side to place bed pan)   Supine to sit: Min assist;Mod assist;HOB elevated Sit to supine: Mod assist;HOB elevated   General bed mobility comments: assist for trunk; 2 assist to boost pt up in bed using bed sheet    Transfers                 General transfer comment: Deferred d/t low BP and pt needing to have BM  Ambulation/Gait                 Stairs             Wheelchair Mobility    Modified Rankin (Stroke Patients Only)       Balance Overall balance assessment: Needs assistance Sitting-balance support: Feet supported;Bilateral upper extremity supported Sitting balance-[name]: Fair Sitting balance - Comments: steady static sitting                                    Cognition Arousal/Alertness: Awake/alert Behavior During Therapy: Flat affect Overall Cognitive Status: Within Functional Limits for tasks assessed                                        Exercises      General  Comments  Nursing cleared pt for participation in physical therapy.  Pt agreeable to PT session.      Pertinent Vitals/Pain Pain Assessment: 0-10 Pain Score: 2  Pain Location: pt's bottom Pain Descriptors / Indicators: Sore Pain Intervention(s): Limited activity within patient's tolerance;Monitored during session;Repositioned    Home Living                      Prior Function            PT Goals (current goals can now be found in the care plan section) Acute Rehab PT Goals Patient Stated Goal: to improve mobility PT Goal Formulation: With patient Time For Goal Achievement: 02/04/21 Potential to Achieve Goals: Good Progress towards PT goals: Progressing toward goals    Frequency    Min  2X/week      PT Plan Current plan remains appropriate    Co-evaluation              AM-PAC PT "6 Clicks" Mobility   Outcome Measure  Help needed turning from your back to your side while in a flat bed without using bedrails?: None Help needed moving from lying on your back to sitting on the side of a flat bed without using bedrails?: A Lot Help needed moving to and from a bed to a chair (including a wheelchair)?: A Little Help needed standing up from a chair using your arms (e.g., wheelchair or bedside chair)?: A Lot Help needed to walk in hospital room?: A Little Help needed climbing 3-5 steps with a railing? : A Lot 6 Click Score: 16    End of Session Equipment Utilized During Treatment: Gait belt Activity Tolerance: Patient tolerated treatment well Patient left: in bed;with call bell/phone within reach;with bed alarm set;with nursing/sitter in room Nurse Communication: Mobility status;Precautions;Other (comment) (pt's low BP) PT Visit Diagnosis: Other abnormalities of gait and mobility (R26.89);Muscle weakness (generalized) (M62.81)     Time: 2119-4174 PT Time Calculation (min) (ACUTE ONLY): 23 min  Charges:  $Therapeutic Exercise: 8-22 mins $Therapeutic Activity: 8-22 mins                    Leitha Bleak, PT 01/27/21, 12:12 PM

## 2021-01-27 NOTE — Progress Notes (Signed)
Pt eating and drinking well today. Refusing Ensure's saying that he's convinced they are giving him diarrhea. He has had several loose stools today. No other complaints. No pain.

## 2021-01-27 NOTE — Care Management Important Message (Signed)
Important Message  Patient Details  Name: John Reilly MRN: [id number] Date of Birth: October 13, 1955   Medicare Important Message Given:  Yes     Dannette Barbara 01/27/2021, 2:10 PM

## 2021-01-28 LAB — RENAL FUNCTION PANEL
Albumin: 2.2 g/dL — ABNORMAL LOW (ref 3.5–5.0)
Anion gap: 6 (ref 5–15)
BUN: 7 mg/dL — ABNORMAL LOW (ref 8–23)
CO2: 25 mmol/L (ref 22–32)
Calcium: 8 mg/dL — ABNORMAL LOW (ref 8.9–10.3)
Chloride: 103 mmol/L (ref 98–111)
Creatinine, Ser: 0.85 mg/dL (ref 0.61–1.24)
GFR, Estimated: >60 mL/min (ref >60)
Glucose, Bld: 144 mg/dL — ABNORMAL HIGH (ref 70–99)
Phosphorus: 2.3 mg/dL — ABNORMAL LOW (ref 2.5–4.6)
Potassium: 4.7 mmol/L (ref 3.5–5.1)
Sodium: 134 mmol/L — ABNORMAL LOW (ref 135–145)

## 2021-01-28 LAB — RESP PANEL BY RT-PCR (FLU A&B, COVID) ARPGX2
Influenza A by PCR: NEGATIVE
Influenza B by PCR: NEGATIVE
SARS Coronavirus 2 by RT PCR: NEGATIVE

## 2021-01-28 LAB — GLUCOSE, CAPILLARY
Glucose-Capillary: 185 mg/dL — ABNORMAL HIGH (ref 70–99)
Glucose-Capillary: 190 mg/dL — ABNORMAL HIGH (ref 70–99)

## 2021-01-28 LAB — HEMOGLOBIN AND HEMATOCRIT, BLOOD
HCT: 27.3 % — ABNORMAL LOW (ref 39.0–52.0)
Hemoglobin: 9 g/dL — ABNORMAL LOW (ref 13.0–17.0)

## 2021-01-28 LAB — MAGNESIUM: Magnesium: 1.8 mg/dL (ref 1.7–2.4)

## 2021-01-28 MED ORDER — MELATONIN 5 MG PO TABS: 5.0000 mg | ORAL_TABLET | Freq: Every day | ORAL | 0 refills | Status: AC

## 2021-01-28 MED ORDER — INSULIN GLARGINE 100 UNIT/ML ~~LOC~~ SOLN: 6.0000 [IU] | Freq: Every day | SUBCUTANEOUS | 11 refills | Status: AC

## 2021-01-28 MED ORDER — MIDODRINE HCL 5 MG PO TABS: 5.0000 mg | ORAL_TABLET | Freq: Three times a day (TID) | ORAL | 1 refills | Status: AC

## 2021-01-28 MED ORDER — HUMALOG 100 UNIT/ML ~~LOC~~ SOCT: 0.0000 [IU] | Freq: Three times a day (TID) | SUBCUTANEOUS | 11 refills | Status: AC

## 2021-01-28 MED ORDER — THIAMINE HCL 100 MG PO TABS: 100.0000 mg | ORAL_TABLET | Freq: Every day | ORAL | Status: AC

## 2021-01-28 MED ORDER — PANCRELIPASE (LIP-PROT-AMYL) 36000-114000 UNITS PO CPEP: 36000.0000 [IU] | ORAL_CAPSULE | Freq: Three times a day (TID) | ORAL | Status: AC

## 2021-01-28 MED ORDER — ACETAMINOPHEN 325 MG PO TABS: 650.0000 mg | ORAL_TABLET | Freq: Four times a day (QID) | ORAL | Status: AC | PRN

## 2021-01-28 MED ORDER — HUMALOG 100 UNIT/ML ~~LOC~~ SOCT: 3.0000 [IU] | Freq: Three times a day (TID) | SUBCUTANEOUS | 11 refills | Status: AC

## 2021-01-28 MED ORDER — ENSURE ENLIVE PO LIQD: 237.0000 mL | ORAL | 12 refills | Status: AC

## 2021-01-28 MED ORDER — IPRATROPIUM-ALBUTEROL 0.5-2.5 (3) MG/3ML IN SOLN: 3.0000 mL | Freq: Four times a day (QID) | RESPIRATORY_TRACT | Status: AC | PRN

## 2021-01-28 NOTE — Progress Notes (Signed)
Patient discharged to rehab via stretcher and EMS staff. Report called to facility.

## 2021-01-28 NOTE — Progress Notes (Signed)
Mobility Specialist - Progress Note   01/28/21 1100  Mobility  Activity Dangled on edge of bed  Level of Assistance Standby assist, set-up cues, supervision of patient - no hands on  Assistive Device None  Distance Ambulated (ft) 0 ft  Mobility Sit up in bed/chair position for meals  Mobility Response Tolerated well  Mobility performed by Mobility specialist  $Mobility charge 1 Mobility    Pre-mobility: 104 HR, 85/48 BP (supine) During mobility: 114 HR, 53/37 BP (sitting) Post-mobility: 108 HR, 93/53 BP (supine)   Pt lying in bed upon arrival, pt denies pain but does report soreness in back d/t being in bed. BP recorded above. Pt asymptomatic throughout. Further activity deferred d/t low BP and pt requesting to use bedpan. Pt returned to bed with alarm set. RN and NT notified.    Kathee Delton Mobility Specialist 01/28/21, 11:53 AM

## 2021-01-28 NOTE — Plan of Care (Signed)

## 2021-01-28 NOTE — Discharge Summary (Addendum)
Physician Discharge Summary  John Reilly IWP:[id number] DOB: 1955-05-26 DOA: 01/20/2021  PCP: John Hector, MD  Admit date: 01/20/2021 Discharge date: 01/28/2021 Admitted From: Home. Disposition: SNF Recommendations for Outpatient Follow-up:  Follow ups as below. Please obtain CBC/BMP/Mag at follow up Outpatient follow-up with his endocrinologist in 1 to 2 weeks Continue TED hose and abdominal binder Please follow up on the following pending results: None  Discharge Condition: Stable but guarded prognosis CODE STATUS: Full code  Contact information for after-discharge care     Destination     [facility] Preferred SNF .   Service: Skilled Nursing Contact information: [place] [phone]                    Hospital Course: 65 year old M with PMH of DM-1 on insulin pump, Parkinson disease, COPD not on oxygen, HTN, GERD, EtOH abuse, pancreatic insufficiency and debility brought to ED by EMS due to altered mental status and hypoglycemia to 20.  Reportedly drinks about 42 ounce of beer daily.  Hypoglycemia resolved after dextrose and glucagon by EMS.  Per diabetic coordinator, hypoglycemia felt to be he is expired Dexcom sensor.  He was admitted for acute metabolic encephalopathy in the setting of hypoglycemia and possible aspiration pneumonia. He was also very orthostatic.    Completed antibiotic course for possible aspiration pneumonia.  Remained stable from respiratory standpoint.  Encephalopathy and hypoglycemia resolved.  However, patient physically deconditioned with orthostasis.  Blood pressure improved after discontinuing antihypertensive meds, increased midodrine, started TED hose and abdominal binder.    See individual problem list below for more on hospital course.  Discharge Diagnoses:  Severe sepsis in the setting of aspiration pneumonia: POA.  Met criteria with tachycardia, tachypnea,  hypotension and encephalopathy.  CXR concerning for RLL infiltrate.  Procalcitonin elevated.  Sepsis physiology resolved.  Blood cultures NGTD. -Completed 5 days of CTX and Flagyl on 9/16. -Continue PT/OT -SLP recommended regular diet with more mechanical soft consistency   Uncontrolled DM-1 with hypoglycemia: On insulin pump at home.  Was hypoglycemic to 20 at home when EMS picked him up.  Reportedly, patient is very sensitive to insulin.  Last A1c 6.6% in 03/2020. No results for input(s): HGBA1C in the last 72 hours. Recent Labs  Lab 01/27/21 1110 01/27/21 1655 01/27/21 2000 01/27/21 2357 01/28/21 0726  GLUCAP 242* 152* 134* 128* 185*  -Continue basal insulin at 6 units nightly -Continue SSI-sensitive -Continue mealtime coverage at 3 units 3 times daily if he completes > 50% of his meals. -Liberated diet to regular   Acute metabolic encephalopathy: Likely due to the above. Resolved. -Reorientation and delirium precautions.   Orthostatic hypotension: Multifactorial including Parkinson's disease, uncontrolled diabetes, alcohol abuse and acute illness.  Carotid US and TTE without significant finding.  A.m. cortisol 9.6>> 12.8.  TSH 7.3.  Free T4 1.0.  On Florinef and midodrine at home.  -Increased midodrine from 5 mg twice daily to 3 times daily -Elevate head of bed to 45 degree -Continue TED hose, abdominal binder, PT and OT.   Extremity edema: Likely from malnutrition and hypoalbuminemia.  Improved with IV diuretics in combination with albumin.   Parkinson disease ([place]) -Continue Sinemet -Outpatient follow-up with neurology as previously planned   COPD (chronic obstructive pulmonary disease: No respiratory symptoms.  Does not seem to be on medications for this. -As needed DuoNeb   Chronic pancreatic insufficiency -Increase Creon to 36,000 units 3 times daily  Hyponatremia/hypokalemia/hypomagnesemia/hypophosphatemia-resolved. -Recheck renal function and magnesium in 1 week    Hypocalcemia: Corrects to normal for hypoalbuminemia.   Normocytic anemia: H&H stable.  Anemia panel basically normal. Recent Labs    11/28/20 1706 01/20/21 1013 01/21/21 0453 01/22/21 0721 01/23/21 0649 01/24/21 0546 01/25/21 0450 01/26/21 0434 01/27/21 0509 01/28/21 0402  HGB 11.0* 11.5* 9.8* 8.7* 10.0* 9.1* 9.1* 8.8* 9.3* 9.0*  -Recheck CBC in 1 week   Alcohol abuse: reportedly drinks about 42 ounce of beers a day.  No withdrawal symptoms. -Encouraged alcohol cessation -Continue multivitamin and thiamine.   Physical deconditioning/debility -Continue PT/OT at SNF.   Chronic back pain/thoracic compression fracture status post kyphoplasty: Improved -As needed Tylenol   Severe protein calorie malnutrition: As evidenced by low BMI, significant weight loss and significant muscle mass and subcu fat loss. Likely due to diabetes, pancreatic insufficiency, alcohol abuse and other chronic illness.  Poor p.o. intake.  -Liberated diet -Continue multivitamins -Continue Ensure -Increased Creon to 36,000 units. Body mass index is 19.39 kg/m. Nutrition Problem: Severe Malnutrition Etiology: social / environmental circumstances (etoh abuse) Signs/Symptoms: percent weight loss, severe muscle depletion, energy intake < 75% for > or equal to 3 months (14% x 7 months) Percent weight loss: 14 % Interventions: Refer to RD note for recommendations     Discharge Exam: Vitals:   01/28/21 0026 01/28/21 0409 01/28/21 0500 01/28/21 0725  BP: 120/60 130/68  117/60  Pulse: 86 76  81  Temp: 98.2 F (36.8 C) 98.1 F (36.7 C)  97.8 F (36.6 C)  Resp: _0 Height:      Weight:   61.3 kg   SpO2: 97% 99%  98%  TempSrc: Oral Oral    BMI (Calculated):   19.39      GENERAL: Frail and chronically ill-appearing.  No apparent distress. HEENT: MMM.  Vision and hearing grossly intact.  NECK: Supple.  No apparent JVD.  RESP: 100% on RA.  No IWOB.  Fair aeration bilaterally. CVS:  RRR.  Heart sounds normal.  ABD/GI/GU: Bowel sounds present. Soft. Non tender.  MSK/EXT:  Moves extremities. No apparent deformity.  Trace dependent edema's. SKIN: no apparent skin lesion or wound NEURO: Awake and alert.  Oriented appropriately.  No apparent focal neuro deficit. PSYCH: Calm. Normal affect.   Discharge Instructions   Allergies as of 01/28/2021       Reactions   Penicillins Rash, Other (See Comments)   Has patient had a PCN reaction causing immediate rash, facial/tongue/throat swelling, SOB or lightheadedness with hypotension: Yes Has patient had a PCN reaction causing severe rash involving mucus membranes or skin necrosis: No Has patient had a PCN reaction that required hospitalization: No Has patient had a PCN reaction occurring within the last 10 years: No If all of the above answers are "NO", then may proceed with Cephalosporin use. Other reaction(s): RASH Has patient had a PCN reaction causing immediate rash, facial/tongue/throat swelling, SOB or lightheadedness with hypotension: Yes Has patient had a PCN reaction causing severe rash involving mucus membranes or skin necrosis: No Has patient had a PCN reaction that required hospitalization: No Has patient had a PCN reaction occurring within the last 10 years: No If all of the above answers are "NO", then may proceed with Cephalosporin use. Reaction:  Unknown; childhood reaction  Has patient had a PCN reaction causing immediate rash, facial/tongue/throat swelling, SOB or lightheadedness with hypotension: Yes Has patient had a PCN reaction causing severe rash involving mucus membranes or skin necrosis: No  Has patient had a PCN reaction that required hospitalization: No Has patient had a PCN reaction occurring within the last 10 years: No If all of the above answers are "NO", then may proceed with Cephalosporin use.   Sulfa Antibiotics Rash   Katherina Right Syndrome (SJS)        Medication List     STOP taking these  medications    HYDROcodone-acetaminophen 5-325 MG tablet Commonly known as: Norco   insulin lispro 100 UNIT/ML injection Commonly known as: HUMALOG Replaced by: HumaLOG 100 UNIT/ML cartridge   meloxicam 7.5 MG tablet Commonly known as: MOBIC   metoprolol succinate 50 MG 24 hr tablet Commonly known as: TOPROL-XL   Paradigm Pump Reservoir 1.68m Misc   potassium chloride SA 20 MEQ tablet Commonly known as: KLOR-CON       TAKE these medications    acetaminophen 325 MG tablet Commonly known as: TYLENOL Take 2 tablets (650 mg total) by mouth every 6 (six) hours as needed for mild pain, fever or headache. What changed:  medication strength how much to take reasons to take this   carbidopa-levodopa 25-250 MG tablet Commonly known as: SINEMET IR Take 1 tablet by mouth 4 (four) times daily.   carbidopa-levodopa 50-200 MG tablet Commonly known as: SINEMET CR Take 1 tablet by mouth at bedtime.   CREON PO Take by mouth 3 (three) times daily.   feeding supplement Liqd Take 237 mLs by mouth daily.   fludrocortisone 0.1 MG tablet Commonly known as: FLORINEF Take 200 mcg by mouth daily.   folic acid 8563MCG tablet Commonly known as: FOLVITE Take 800 mcg by mouth daily.   HumaLOG 100 UNIT/ML cartridge Generic drug: insulin lispro Inject 0-0.09 mLs (0-9 Units total) into the skin 3 (three) times daily with meals. 0-9 Units, Subcutaneous, 3 times daily with meals, First dose on Sun 01/26/21 at 1700 Correction coverage: Sensitive (thin, NPO, renal) CBG < 70: Implement Hypoglycemia Standing Orders and refer to Hypoglycemia Standing Orders sidebar report CBG 70 - 120: 0 units CBG 121 - 150: 1 unit CBG 151 - 200: 2 units CBG 201 - 250: 3 units CBG 251 - 300: 5 units CBG 301 - 350: 7 units CBG 351 - 400: 9 units CBG > 400: call MD and obtain STAT lab verification Replaces: insulin lispro 100 UNIT/ML injection   HumaLOG 100 UNIT/ML cartridge Generic drug: insulin lispro Inject 0.03  mLs (3 Units total) into the skin 3 (three) times daily with meals. Hold if not completing more than 50% of his meals or CBG <100   insulin glargine 100 UNIT/ML injection Commonly known as: Lantus Inject 0.06 mLs (6 Units total) into the skin at bedtime.   melatonin 5 MG Tabs Take 1 tablet (5 mg total) by mouth at bedtime.   midodrine 5 MG tablet Commonly known as: PROAMATINE Take 1 tablet (5 mg total) by mouth 3 (three) times daily with meals. What changed:  medication strength how much to take when to take this   multivitamin with minerals Tabs tablet Take 1 tablet by mouth daily.   omeprazole 40 MG capsule Commonly known as: PRILOSEC Take 40 mg by mouth at bedtime.   rivastigmine 1.5 MG capsule Commonly known as: EXELON Take 1.5 mg by mouth 2 (two) times daily.   thiamine 100 MG tablet Take 1 tablet (100 mg total) by mouth daily.        Consultations: Diabetic coordinator Procedures/Studies:   CT HEAD WO CONTRAST (5MM)  Result Date:  01/20/2021 CLINICAL DATA:  Head trauma, minor EXAM: CT HEAD WITHOUT CONTRAST TECHNIQUE: Contiguous axial images were obtained from the base of the skull through the vertex without intravenous contrast. COMPARISON:  07/19/2020 FINDINGS: Brain: There is no acute intracranial hemorrhage, mass effect, or edema. Gray-white differentiation is preserved. There is no extra-axial fluid collection. Ventricles and sulci are stable in size and configuration. Patchy low-attenuation in the supratentorial white matter is nonspecific but probably reflects stable chronic microvascular ischemic changes. Vascular: There is mild atherosclerotic calcification at the skull base. Skull: Calvarium is unremarkable. Sinuses/Orbits: No acute finding. Other: None. IMPRESSION: No evidence of acute intracranial injury. Electronically Signed   By: Macy Mis M.D.   On: 01/20/2021 14:26   CT Angio Chest PE W and/or Wo Contrast  Result Date: 01/20/2021 CLINICAL DATA:   Suspected pulmonary embolism in a 65 year old male with hypoglycemia and unresponsiveness. EXAM: CT ANGIOGRAPHY CHEST WITH CONTRAST TECHNIQUE: Multidetector CT imaging of the chest was performed using the standard protocol during bolus administration of intravenous contrast. Multiplanar CT image reconstructions and MIPs were obtained to evaluate the vascular anatomy. CONTRAST:  72m OMNIPAQUE IOHEXOL 350 MG/ML SOLN COMPARISON:  Chest x-ray from January 20, 2021. No prior cross-sectional imaging of the chest for comparison. FINDINGS: Cardiovascular: Normal caliber of the thoracic aorta. Normal heart size without substantial pericardial effusion. Adequate opacification of pulmonary vasculature. No sign of pulmonary embolism. Mediastinum/Nodes: No adenopathy in the chest. Esophagus grossly normal. Lungs/Pleura: Small to moderate bilateral pleural effusions RIGHT greater than LEFT layering dependently in the bilateral chest. Ground-glass and consolidative changes also RIGHT greater than LEFT. Filling defects in RIGHT lower lobe bronchi. Material in the trachea as well. LEFT lower lobe bronchi are patent. No pneumothorax. Upper Abdomen: Incidental imaging of upper abdominal contents without acute process very limited assessment. Musculoskeletal: Partially imaged changes of cement augmentation at the T12 vertebral level. T5 with approximally 20-30% loss of height, some surrounding degenerative change. No surrounding stranding. Mild loss of height at T8 also with some surrounding degenerative change. Findings similar to prior MRI of the spine. Evidence of prior LEFT humeral fracture. Review of the MIP images confirms the above findings. IMPRESSION: Negative for pulmonary embolism. Findings of suspected aspiration related pneumonia in the RIGHT lower lobe, airspace disease seen to a lesser extent in the LEFT chest and associated with bilateral effusions. Suggest follow-up to ensure resolution. Signs of thoracic spine  compression fractures as on recent spinal Aortic Atherosclerosis (ICD10-I70.0).  MRI. Electronically Signed   By: GZetta BillsM.D.   On: 01/20/2021 14:31   CT Cervical Spine Wo Contrast  Result Date: 01/20/2021 CLINICAL DATA:  Neck trauma EXAM: CT CERVICAL SPINE WITHOUT CONTRAST TECHNIQUE: Multidetector CT imaging of the cervical spine was performed without intravenous contrast. Multiplanar CT image reconstructions were also generated. COMPARISON:  07/19/2020 FINDINGS: Alignment: Stable Skull base and vertebrae: Stable vertebral body heights. No acute fracture. Soft tissues and spinal canal: No prevertebral fluid or swelling. No visible canal hematoma. Disc levels: Multilevel degenerative changes are present including disc space narrowing, endplate osteophytes, and facet and uncovertebral hypertrophy. Upper chest: No apical lung mass. Other: None IMPRESSION: No acute cervical spine fracture. Electronically Signed   By: PMacy MisM.D.   On: 01/20/2021 14:32   [place]Carotid Bilateral  Result Date: 01/24/2021 CLINICAL DATA:  65year old male with a history of orthostatic hypotension EXAM: BILATERAL CAROTID DUPLEX ULTRASOUND TECHNIQUE: GPearline Cablesscale imaging, color Doppler and duplex ultrasound were performed of bilateral carotid and vertebral arteries in the  neck. COMPARISON:  None. FINDINGS: Criteria: Quantification of carotid stenosis is based on velocity parameters that correlate the residual internal carotid diameter with NASCET-based stenosis levels, using the diameter of the distal internal carotid lumen as the denominator for stenosis measurement. The following velocity measurements were obtained: RIGHT ICA:  Systolic 82 cm/sec, Diastolic 14 cm/sec CCA:  63 cm/sec SYSTOLIC ICA/CCA RATIO:  1.3 ECA:  76 cm/sec LEFT ICA:  Systolic 78 cm/sec, Diastolic 15 cm/sec CCA:  57 cm/sec SYSTOLIC ICA/CCA RATIO:  1.4 ECA:  51 cm/sec Right Brachial SBP: Not acquired Left Brachial SBP: Not acquired RIGHT CAROTID ARTERY:  No significant calcified disease of the right common carotid artery. Intermediate waveform maintained. Homogeneous plaque without significant calcifications at the right carotid bifurcation. Low resistance waveform of the right ICA. No significant tortuosity. RIGHT VERTEBRAL ARTERY: Antegrade flow with low resistance waveform. LEFT CAROTID ARTERY: No significant calcified disease of the left common carotid artery. Intermediate waveform maintained. Homogeneous plaque at the left carotid bifurcation without significant calcifications. Low resistance waveform of the left ICA. LEFT VERTEBRAL ARTERY:  Antegrade flow with low resistance waveform. IMPRESSION: Color duplex indicates minimal homogeneous plaque, with no hemodynamically significant stenosis by duplex criteria in the extracranial cerebrovascular circulation. Signed, Dulcy Fanny. Dellia Nims, RPVI [facility] Electronically Signed   By: Corrie Mckusick D.O.   On: 01/24/2021 14:22   DG Chest Portable 1 View  Result Date: 01/20/2021 CLINICAL DATA:  65 year old male with hypoglycemia.  Cough. EXAM: PORTABLE CHEST 1 VIEW COMPARISON:  Portable chest 03/23/2020 and earlier. FINDINGS: Portable AP upright view at 1028 hours. Chronic proximal left humerus deformity. Improved lung volumes from last year. Resolved streaky left lung base opacity, but similar streaky and now confluent right lung base opacity. Mediastinal contours remain normal. Elsewhere the lungs appear clear. No pneumothorax or pleural effusion. However, a mildly displaced right lateral 5th rib fracture is new. But other chronic bilateral rib fractures are redemonstrated (right 2nd, left 4th, 5th, 8th). IMPRESSION: 1. Confluent right lung base opacity, with similar bibasilar opacity last year but resolved on the left. Consider acute aspiration and/or pneumonia in this setting. 2. Acute appearing and mildly displaced right lateral 5th rib fracture.  There are other chronic bilateral rib fractures. Electronically Signed   By: Genevie Ann M.D.   On: 01/20/2021 10:58   ECHOCARDIOGRAM COMPLETE  Result Date: 01/26/2021    ECHOCARDIOGRAM REPORT   Patient Name:   John Reilly Date of Exam: 01/26/2021 Medical Rec #:  [id number]     Height:       70.0 in Accession #:    [id number]    Weight:       128.1 lb Date of Birth:  03-22-1956     BSA:          1.727 m Patient Age:    65 years      BP:           116/70 mmHg Patient Gender: M             HR:           85 bpm. Exam Location:  [facility] Procedure: 2D Echo, Cardiac Doppler and Color Doppler Indications:     Other-Full Diagnosis List                  Full ICD-10/Reason for Exam Orthostatic hypotension                  [458.0.ICD-9-CM]  History:         Patient has prior history of Echocardiogram examinations. Risk                  Factors:Diabetes and Hypertension.  Sonographer:     Alyse Low Roar Referring Phys:  [id number] [facility] Diagnosing Phys: Ida Rogue MD IMPRESSIONS  1. Left ventricular ejection fraction, by estimation, is 60 to 65%. The left ventricle has normal function. The left ventricle has no regional wall motion abnormalities. Left ventricular diastolic parameters are consistent with Grade I diastolic dysfunction (impaired relaxation).  2. Right ventricular systolic function is normal. The right ventricular size is normal.  3. The mitral valve is normal in structure. No evidence of mitral valve regurgitation. No evidence of mitral stenosis.  4. The aortic valve is normal in structure. Aortic valve regurgitation is not visualized. No aortic stenosis is present.  5. The inferior vena cava is normal in size with greater than 50% respiratory variability, suggesting right atrial pressure of 3 mmHg. FINDINGS  Left Ventricle: Left ventricular ejection fraction, by estimation, is 60 to 65%. The left ventricle has normal function. The left ventricle has no regional wall motion abnormalities. The left ventricular  internal cavity size was normal in size. There is  no left ventricular hypertrophy. Left ventricular diastolic parameters are consistent with Grade I diastolic dysfunction (impaired relaxation). Right Ventricle: The right ventricular size is normal. No increase in right ventricular wall thickness. Right ventricular systolic function is normal. Left Atrium: Left atrial size was normal in size. Right Atrium: Right atrial size was normal in size. Pericardium: There is no evidence of pericardial effusion. Mitral Valve: The mitral valve is normal in structure. No evidence of mitral valve regurgitation. No evidence of mitral valve stenosis. Tricuspid Valve: The tricuspid valve is normal in structure. Tricuspid valve regurgitation is not demonstrated. No evidence of tricuspid stenosis. Aortic Valve: The aortic valve is normal in structure. Aortic valve regurgitation is not visualized. No aortic stenosis is present. Aortic valve peak gradient measures 8.4 mmHg. Pulmonic Valve: The pulmonic valve was normal in structure. Pulmonic valve regurgitation is not visualized. No evidence of pulmonic stenosis. Aorta: The aortic root is normal in size and structure. Venous: The inferior vena cava is normal in size with greater than 50% respiratory variability, suggesting right atrial pressure of 3 mmHg. IAS/Shunts: No atrial level shunt detected by color flow Doppler.  LEFT VENTRICLE PLAX 2D LVIDd:         3.30 cm  Diastology LVIDs:         2.30 cm  LV e' medial:    6.31 cm/s LV PW:         0.90 cm  LV E/e' medial:  10.2 LV IVS:        1.00 cm  LV e' lateral:   9.68 cm/s LVOT diam:     1.80 cm  LV E/e' lateral: 6.6 LVOT Area:     2.54 cm  RIGHT VENTRICLE RV Basal diam:  2.80 cm RV Mid diam:    2.30 cm RV S prime:     14.10 cm/s TAPSE (M-mode): 2.3 cm LEFT ATRIUM             Index       RIGHT ATRIUM           Index LA diam:        3.40 cm 1.97 cm/m  RA Area:     11.40 cm LA Vol (A2C):   36.9  ml 21.36 ml/m RA Volume:   23.50 ml   13.61 ml/m LA Vol (A4C):   25.0 ml 14.47 ml/m LA Biplane Vol: 33.3 ml 19.28 ml/m  AORTIC VALVE                PULMONIC VALVE AV Area (Vmax): 2.14 cm    PV Vmax:        1.44 m/s AV Vmax:        145.00 cm/s PV Peak grad:   8.3 mmHg AV Peak Grad:   8.4 mmHg    RVOT Peak grad: 7 mmHg LVOT Vmax:      122.00 cm/s  AORTA Ao Root diam: 3.00 cm MITRAL VALVE MV Area (PHT): 3.93 cm    SHUNTS MV Decel Time: 193 msec    Systemic Diam: 1.80 cm MV E velocity: 64.30 cm/s MV A velocity: 97.90 cm/s MV E/A ratio:  0.66 MV A Prime:    11.7 cm/s Ida Rogue MD Electronically signed by Ida Rogue MD Signature Date/Time: 01/26/2021/7:03:18 PM    Final        The results of significant diagnostics from this hospitalization (including imaging, microbiology, ancillary and laboratory) are listed below for reference.     Microbiology: Recent Results (from the past 240 hour(s))  Resp Panel by RT-PCR (Flu A&B, Covid) Nasopharyngeal Swab     Status: None   Collection Time: 01/20/21 10:17 AM   Specimen: Nasopharyngeal Swab; Nasopharyngeal(NP) swabs in vial transport medium  Result Value Ref Range Status   SARS Coronavirus 2 by RT PCR NEGATIVE NEGATIVE Final    Comment: (NOTE) SARS-CoV-2 target nucleic acids are NOT DETECTED.  The SARS-CoV-2 RNA is generally detectable in upper respiratory specimens during the acute phase of infection. The lowest concentration of SARS-CoV-2 viral copies this assay can detect is 138 copies/mL. A negative result does not preclude SARS-Cov-2 infection and should not be used as the sole basis for treatment or other patient management decisions. A negative result may occur with  improper specimen collection/handling, submission of specimen other than nasopharyngeal swab, presence of viral mutation(s) within the areas targeted by this assay, and inadequate number of viral copies(<138 copies/mL). A negative result must be combined with clinical observations, patient history, and  epidemiological information. The expected result is Negative.  Fact Sheet for Patients:  EntrepreneurPulse.com.au  Fact Sheet for Healthcare Providers:  IncredibleEmployment.be  This test is no t yet approved or cleared by the [place] FDA and  has been authorized for detection and/or diagnosis of SARS-CoV-2 by FDA under an Emergency Use Authorization (EUA). This EUA will remain  in effect (meaning this test can be used) for the duration of the COVID-19 declaration under Section 564(b)(1) of the Act, 21 U.S.C.section 360bbb-3(b)(1), unless the authorization is terminated  or revoked sooner.       Influenza A by PCR NEGATIVE NEGATIVE Final   Influenza B by PCR NEGATIVE NEGATIVE Final    Comment: (NOTE) The Xpert Xpress SARS-CoV-2/FLU/RSV plus assay is intended as an aid in the diagnosis of influenza from Nasopharyngeal swab specimens and should not be used as a sole basis for treatment. Nasal washings and aspirates are unacceptable for Xpert Xpress SARS-CoV-2/FLU/RSV testing.  Fact Sheet for Patients: EntrepreneurPulse.com.au  Fact Sheet for Healthcare Providers: IncredibleEmployment.be  This test is not yet approved or cleared by the [place] FDA and has been authorized for detection and/or diagnosis of SARS-CoV-2 by FDA under an Emergency Use Authorization (EUA). This EUA will remain in effect (meaning this  test can be used) for the duration of the COVID-19 declaration under Section 564(b)(1) of the Act, 21 U.S.C. section 360bbb-3(b)(1), unless the authorization is terminated or revoked.  Performed at [facility], [address] [place], [place] [address]   Blood culture (routine x 2)     Status: None   Collection Time: 01/20/21 12:50 PM   Specimen: BLOOD  Result Value Ref Range Status   Specimen Description BLOOD BLOOD LEFT FOREARM  Final   Special Requests   Final     BOTTLES DRAWN AEROBIC AND ANAEROBIC Blood Culture adequate volume   Culture   Final    NO GROWTH 5 DAYS Performed at [facility], [address] [place], [place] [address]    Report Status 01/25/2021 FINAL  Final  Blood culture (routine x 2)     Status: None   Collection Time: 01/20/21  1:12 PM   Specimen: BLOOD  Result Value Ref Range Status   Specimen Description BLOOD RIGHT ANTECUBITAL  Final   Special Requests   Final    BOTTLES DRAWN AEROBIC AND ANAEROBIC Blood Culture adequate volume   Culture   Final    NO GROWTH 5 DAYS Performed at [facility], [address] [place], [place] [address]    Report Status 01/25/2021 FINAL  Final     Labs:  CBC: Recent Labs  Lab 01/22/21 0721 01/23/21 0649 01/24/21 0546 01/25/21 0450 01/26/21 0434 01/27/21 0509 01/28/21 0402  WBC 6.7 6.0 5.5  --  5.0  --   --   HGB 8.7* 10.0* 9.1* 9.1* 8.8* 9.3* 9.0*  HCT 25.7* 30.0* 27.4* 28.0* 27.4* 27.4* 27.3*  MCV 97.0 98.0 98.2  --  99.3  --   --   PLT 354 401* 375  --  332  --   --    BMP &GFR Recent Labs  Lab 01/24/21 0546 01/25/21 0450 01/26/21 0434 01/27/21 0509 01/28/21 0402  NA 133* 135 135 134* 134*  K 3.7 4.1 4.1 2.8* 4.7  CL 110 109 105 102 103  CO2 21* _0 GLUCOSE 153* 119* 163* 234* 144*  BUN _1 7* 7*  CREATININE 0.78 0.82 0.70 0.89 0.85  CALCIUM 7.2* 7.6* 7.7* 7.8* 8.0*  MG 1.9 1.7 1.4* 2.1 1.8  PHOS 3.3 2.9 2.9 2.5 2.3*   Estimated Creatinine Clearance: 74.1 mL/min (by C-G formula based on SCr of 0.85 mg/dL). Liver & Pancreas: Recent Labs  Lab 01/24/21 0546 01/25/21 0450 01/26/21 0434 01/27/21 0509 01/28/21 0402  ALBUMIN 1.4* 1.5* 2.1* 2.5* 2.2*   No results for input(s): LIPASE, AMYLASE in the last 168 hours. No results for input(s): AMMONIA in the last 168 hours. Diabetic: No results for input(s): HGBA1C in the last 72 hours. Recent Labs  Lab 01/27/21 1110 01/27/21 1655 01/27/21 2000 01/27/21 2357 01/28/21 0726   GLUCAP 242* 152* 134* 128* 185*   Cardiac Enzymes: Recent Labs  Lab 01/23/21 0649 01/26/21 0434  CKTOTAL 13* 10*   No results for input(s): PROBNP in the last 8760 hours. Coagulation Profile: No results for input(s): INR, PROTIME in the last 168 hours. Thyroid Function Tests: No results for input(s): TSH, T4TOTAL, FREET4, T3FREE, THYROIDAB in the last 72 hours. Lipid Profile: No results for input(s): CHOL, HDL, LDLCALC, TRIG, CHOLHDL, LDLDIRECT in the last 72 hours. Anemia Panel: No results for input(s): VITAMINB12, FOLATE, FERRITIN, TIBC, IRON, RETICCTPCT in the last 72 hours. Urine analysis:    Component Value Date/Time  COLORURINE YELLOW (A) 01/21/2021 0137   APPEARANCEUR CLEAR (A) 01/21/2021 0137   LABSPEC 1.040 (H) 01/21/2021 0137   PHURINE 6.0 01/21/2021 0137   GLUCOSEU 150 (A) 01/21/2021 0137   HGBUR NEGATIVE 01/21/2021 0137   BILIRUBINUR NEGATIVE 01/21/2021 0137   KETONESUR 20 (A) 01/21/2021 0137   PROTEINUR NEGATIVE 01/21/2021 0137   NITRITE NEGATIVE 01/21/2021 [place] 01/21/2021 0137   Sepsis Labs: Invalid input(s): PROCALCITONIN, LACTICIDVEN   Time coordinating discharge: 45 minutes  SIGNED:  Mercy Riding, MD  [facility] 01/28/2021, 11:45 AM

## 2021-01-28 NOTE — TOC Transition Note (Signed)
Transition of Care [facility]) - CM/SW Discharge Note   Patient Details  Name: John Reilly MRN: [id number] Date of Birth: May 28, 1955  Transition of Care [facility]) CM/SW Contact:  Eileen Stanford, LCSW Phone Number: 01/28/2021, 2:26 PM   Clinical Narrative:  Clinical Social Worker facilitated patient discharge including contacting patient family and facility to confirm patient discharge plans.  Clinical information faxed to facility and family agreeable with plan.  CSW arranged ambulance transport via [facility] to [facility] ( room 808) .  RN to call [phone] for report prior to discharge.    Final next level of care: Skilled Nursing Facility Barriers to Discharge: No Barriers Identified   Patient Goals and CMS Choice Patient states their goals for this hospitalization and ongoing recovery are:: to go to snf CMS Medicare.gov Compare Post Acute Care list provided to:: Patient Represenative (must comment) (spouse [name]) Choice offered to / list presented to : Spouse  Discharge Placement              Patient chooses bed at:  ([facility]) Patient to be transferred to facility by: [facility] Name of family member notified: [name] Patient and family notified of of transfer: 01/28/21  Discharge Plan and Services                                     Social Determinants of Health (SDOH) Interventions     Readmission Risk Interventions No flowsheet data found.

## 2021-02-03 ENCOUNTER — Inpatient Hospital Stay
Admission: EM | Admit: 2021-02-03 | Discharge: 2021-03-11 | DRG: 871 | Disposition: E | Payer: Medicare Other | Attending: Internal Medicine | Admitting: Internal Medicine

## 2021-02-03 ENCOUNTER — Emergency Department: Payer: Medicare Other

## 2021-02-03 DIAGNOSIS — E10319 Type 1 diabetes mellitus with unspecified diabetic retinopathy without macular edema: Secondary | ICD-10-CM | POA: Diagnosis present

## 2021-02-03 DIAGNOSIS — Z79899 Other long term (current) drug therapy: Secondary | ICD-10-CM

## 2021-02-03 DIAGNOSIS — A4189 Other specified sepsis: Principal | ICD-10-CM | POA: Diagnosis present

## 2021-02-03 DIAGNOSIS — Z820 Family history of epilepsy and other diseases of the nervous system: Secondary | ICD-10-CM

## 2021-02-03 DIAGNOSIS — A0472 Enterocolitis due to Clostridium difficile, not specified as recurrent: Secondary | ICD-10-CM | POA: Diagnosis present

## 2021-02-03 DIAGNOSIS — E876 Hypokalemia: Secondary | ICD-10-CM | POA: Diagnosis present

## 2021-02-03 DIAGNOSIS — E1011 Type 1 diabetes mellitus with ketoacidosis with coma: Secondary | ICD-10-CM | POA: Diagnosis present

## 2021-02-03 DIAGNOSIS — J9811 Atelectasis: Secondary | ICD-10-CM | POA: Diagnosis present

## 2021-02-03 DIAGNOSIS — J90 Pleural effusion, not elsewhere classified: Secondary | ICD-10-CM | POA: Diagnosis present

## 2021-02-03 DIAGNOSIS — E1051 Type 1 diabetes mellitus with diabetic peripheral angiopathy without gangrene: Secondary | ICD-10-CM | POA: Diagnosis present

## 2021-02-03 DIAGNOSIS — Z66 Do not resuscitate: Secondary | ICD-10-CM | POA: Diagnosis not present

## 2021-02-03 DIAGNOSIS — N390 Urinary tract infection, site not specified: Secondary | ICD-10-CM | POA: Diagnosis not present

## 2021-02-03 DIAGNOSIS — Z515 Encounter for palliative care: Secondary | ICD-10-CM

## 2021-02-03 DIAGNOSIS — E871 Hypo-osmolality and hyponatremia: Secondary | ICD-10-CM | POA: Diagnosis present

## 2021-02-03 DIAGNOSIS — F1010 Alcohol abuse, uncomplicated: Secondary | ICD-10-CM | POA: Diagnosis present

## 2021-02-03 DIAGNOSIS — G309 Alzheimer's disease, unspecified: Secondary | ICD-10-CM | POA: Diagnosis present

## 2021-02-03 DIAGNOSIS — K219 Gastro-esophageal reflux disease without esophagitis: Secondary | ICD-10-CM | POA: Diagnosis present

## 2021-02-03 DIAGNOSIS — Z681 Body mass index (BMI) 19 or less, adult: Secondary | ICD-10-CM

## 2021-02-03 DIAGNOSIS — E0811 Diabetes mellitus due to underlying condition with ketoacidosis with coma: Secondary | ICD-10-CM

## 2021-02-03 DIAGNOSIS — G20 Parkinson's disease: Secondary | ICD-10-CM | POA: Diagnosis present

## 2021-02-03 DIAGNOSIS — R571 Hypovolemic shock: Secondary | ICD-10-CM | POA: Diagnosis present

## 2021-02-03 DIAGNOSIS — U071 COVID-19: Secondary | ICD-10-CM | POA: Diagnosis present

## 2021-02-03 DIAGNOSIS — J441 Chronic obstructive pulmonary disease with (acute) exacerbation: Secondary | ICD-10-CM | POA: Diagnosis present

## 2021-02-03 DIAGNOSIS — R4182 Altered mental status, unspecified: Secondary | ICD-10-CM | POA: Diagnosis present

## 2021-02-03 DIAGNOSIS — J9602 Acute respiratory failure with hypercapnia: Secondary | ICD-10-CM | POA: Diagnosis present

## 2021-02-03 DIAGNOSIS — R6521 Severe sepsis with septic shock: Secondary | ICD-10-CM | POA: Diagnosis not present

## 2021-02-03 DIAGNOSIS — J9601 Acute respiratory failure with hypoxia: Secondary | ICD-10-CM | POA: Diagnosis present

## 2021-02-03 DIAGNOSIS — I248 Other forms of acute ischemic heart disease: Secondary | ICD-10-CM | POA: Diagnosis present

## 2021-02-03 DIAGNOSIS — J1282 Pneumonia due to coronavirus disease 2019: Secondary | ICD-10-CM | POA: Diagnosis present

## 2021-02-03 DIAGNOSIS — D649 Anemia, unspecified: Secondary | ICD-10-CM | POA: Diagnosis present

## 2021-02-03 DIAGNOSIS — E860 Dehydration: Secondary | ICD-10-CM | POA: Diagnosis not present

## 2021-02-03 DIAGNOSIS — E43 Unspecified severe protein-calorie malnutrition: Secondary | ICD-10-CM | POA: Diagnosis present

## 2021-02-03 DIAGNOSIS — R601 Generalized edema: Secondary | ICD-10-CM | POA: Diagnosis present

## 2021-02-03 DIAGNOSIS — Z882 Allergy status to sulfonamides status: Secondary | ICD-10-CM

## 2021-02-03 DIAGNOSIS — Z452 Encounter for adjustment and management of vascular access device: Secondary | ICD-10-CM

## 2021-02-03 DIAGNOSIS — R29810 Facial weakness: Secondary | ICD-10-CM | POA: Diagnosis not present

## 2021-02-03 DIAGNOSIS — G9341 Metabolic encephalopathy: Secondary | ICD-10-CM | POA: Diagnosis present

## 2021-02-03 DIAGNOSIS — Z85038 Personal history of other malignant neoplasm of large intestine: Secondary | ICD-10-CM

## 2021-02-03 DIAGNOSIS — Z8711 Personal history of peptic ulcer disease: Secondary | ICD-10-CM

## 2021-02-03 DIAGNOSIS — I10 Essential (primary) hypertension: Secondary | ICD-10-CM | POA: Diagnosis present

## 2021-02-03 DIAGNOSIS — F0280 Dementia in other diseases classified elsewhere without behavioral disturbance: Secondary | ICD-10-CM | POA: Diagnosis present

## 2021-02-03 DIAGNOSIS — Z28310 Unvaccinated for covid-19: Secondary | ICD-10-CM

## 2021-02-03 DIAGNOSIS — Z978 Presence of other specified devices: Secondary | ICD-10-CM

## 2021-02-03 DIAGNOSIS — J690 Pneumonitis due to inhalation of food and vomit: Secondary | ICD-10-CM | POA: Diagnosis present

## 2021-02-03 DIAGNOSIS — L8990 Pressure ulcer of unspecified site, unspecified stage: Secondary | ICD-10-CM | POA: Insufficient documentation

## 2021-02-03 DIAGNOSIS — A0839 Other viral enteritis: Secondary | ICD-10-CM | POA: Diagnosis present

## 2021-02-03 DIAGNOSIS — R197 Diarrhea, unspecified: Secondary | ICD-10-CM | POA: Diagnosis not present

## 2021-02-03 DIAGNOSIS — E10649 Type 1 diabetes mellitus with hypoglycemia without coma: Secondary | ICD-10-CM | POA: Diagnosis not present

## 2021-02-03 DIAGNOSIS — Z833 Family history of diabetes mellitus: Secondary | ICD-10-CM

## 2021-02-03 DIAGNOSIS — E785 Hyperlipidemia, unspecified: Secondary | ICD-10-CM | POA: Diagnosis present

## 2021-02-03 DIAGNOSIS — A419 Sepsis, unspecified organism: Secondary | ICD-10-CM | POA: Diagnosis not present

## 2021-02-03 DIAGNOSIS — Z880 Allergy status to penicillin: Secondary | ICD-10-CM

## 2021-02-03 DIAGNOSIS — N179 Acute kidney failure, unspecified: Secondary | ICD-10-CM | POA: Diagnosis present

## 2021-02-03 DIAGNOSIS — J440 Chronic obstructive pulmonary disease with acute lower respiratory infection: Secondary | ICD-10-CM | POA: Diagnosis present

## 2021-02-03 DIAGNOSIS — Z87891 Personal history of nicotine dependence: Secondary | ICD-10-CM

## 2021-02-03 DIAGNOSIS — I951 Orthostatic hypotension: Secondary | ICD-10-CM | POA: Diagnosis present

## 2021-02-03 DIAGNOSIS — K8689 Other specified diseases of pancreas: Secondary | ICD-10-CM | POA: Diagnosis present

## 2021-02-03 DIAGNOSIS — Z794 Long term (current) use of insulin: Secondary | ICD-10-CM

## 2021-02-03 DIAGNOSIS — R32 Unspecified urinary incontinence: Secondary | ICD-10-CM | POA: Diagnosis present

## 2021-02-03 DIAGNOSIS — I4892 Unspecified atrial flutter: Secondary | ICD-10-CM | POA: Diagnosis not present

## 2021-02-03 DIAGNOSIS — Z86010 Personal history of colonic polyps: Secondary | ICD-10-CM

## 2021-02-03 DIAGNOSIS — R680 Hypothermia, not associated with low environmental temperature: Secondary | ICD-10-CM | POA: Diagnosis present

## 2021-02-03 DIAGNOSIS — E8809 Other disorders of plasma-protein metabolism, not elsewhere classified: Secondary | ICD-10-CM | POA: Diagnosis present

## 2021-02-03 DIAGNOSIS — E1110 Type 2 diabetes mellitus with ketoacidosis without coma: Secondary | ICD-10-CM | POA: Diagnosis present

## 2021-02-03 LAB — GLUCOSE, CAPILLARY
Glucose-Capillary: 244 mg/dL — ABNORMAL HIGH (ref 70–99)
Glucose-Capillary: 301 mg/dL — ABNORMAL HIGH (ref 70–99)
Glucose-Capillary: 348 mg/dL — ABNORMAL HIGH (ref 70–99)
Glucose-Capillary: 428 mg/dL — ABNORMAL HIGH (ref 70–99)
Glucose-Capillary: 542 mg/dL (ref 70–99)
Glucose-Capillary: 553 mg/dL (ref 70–99)

## 2021-02-03 LAB — FERRITIN: Ferritin: 1331 ng/mL — ABNORMAL HIGH (ref 24–336)

## 2021-02-03 LAB — CBC WITH DIFFERENTIAL/PLATELET
Abs Immature Granulocytes: 0.22 10*3/uL — ABNORMAL HIGH (ref 0.00–0.07)
Basophils Absolute: 0.1 10*3/uL (ref 0.0–0.1)
Basophils Relative: 1 %
Eosinophils Absolute: 0 10*3/uL (ref 0.0–0.5)
Eosinophils Relative: 0 %
HCT: 31 % — ABNORMAL LOW (ref 39.0–52.0)
Hemoglobin: 9 g/dL — ABNORMAL LOW (ref 13.0–17.0)
Immature Granulocytes: 1 %
Lymphocytes Relative: 18 %
Lymphs Abs: 3.1 10*3/uL (ref 0.7–4.0)
MCH: 33.3 pg (ref 26.0–34.0)
MCHC: 29 g/dL — ABNORMAL LOW (ref 30.0–36.0)
MCV: 114.8 fL — ABNORMAL HIGH (ref 80.0–100.0)
Monocytes Absolute: 0.4 10*3/uL (ref 0.1–1.0)
Monocytes Relative: 2 %
Neutro Abs: 13.6 10*3/uL — ABNORMAL HIGH (ref 1.7–7.7)
Neutrophils Relative %: 78 %
Platelets: 491 10*3/uL — ABNORMAL HIGH (ref 150–400)
RBC: 2.7 MIL/uL — ABNORMAL LOW (ref 4.22–5.81)
RDW: 14.5 % (ref 11.5–15.5)
Smear Review: NORMAL
WBC: 17.4 10*3/uL — ABNORMAL HIGH (ref 4.0–10.5)
nRBC: 0 % (ref 0.0–0.2)

## 2021-02-03 LAB — COMPREHENSIVE METABOLIC PANEL
ALT: <5 U/L (ref 0–44)
AST: 29 U/L (ref 15–41)
Albumin: 2.5 g/dL — ABNORMAL LOW (ref 3.5–5.0)
Alkaline Phosphatase: 105 U/L (ref 38–126)
BUN: 25 mg/dL — ABNORMAL HIGH (ref 8–23)
CO2: <7 mmol/L — ABNORMAL LOW (ref 22–32)
Calcium: 8.1 mg/dL — ABNORMAL LOW (ref 8.9–10.3)
Chloride: 100 mmol/L (ref 98–111)
Creatinine, Ser: 2.1 mg/dL — ABNORMAL HIGH (ref 0.61–1.24)
GFR, Estimated: 34 mL/min — ABNORMAL LOW (ref >60)
Glucose, Bld: 772 mg/dL (ref 70–99)
Potassium: 5.1 mmol/L (ref 3.5–5.1)
Sodium: 133 mmol/L — ABNORMAL LOW (ref 135–145)
Total Bilirubin: 1.9 mg/dL — ABNORMAL HIGH (ref 0.3–1.2)
Total Protein: 5.2 g/dL — ABNORMAL LOW (ref 6.5–8.1)

## 2021-02-03 LAB — URINALYSIS, COMPLETE (UACMP) WITH MICROSCOPIC
Bilirubin Urine: NEGATIVE
Glucose, UA: >=500 mg/dL — AB
Ketones, ur: 5 mg/dL — AB
Leukocytes,Ua: NEGATIVE
Nitrite: NEGATIVE
Protein, ur: 30 mg/dL — AB
Specific Gravity, Urine: 1.016 (ref 1.005–1.030)
Squamous Epithelial / HPF: NONE SEEN (ref 0–5)
pH: 5 (ref 5.0–8.0)

## 2021-02-03 LAB — BASIC METABOLIC PANEL
Anion gap: 27 — ABNORMAL HIGH (ref 5–15)
Anion gap: 19 — ABNORMAL HIGH (ref 5–15)
BUN: 24 mg/dL — ABNORMAL HIGH (ref 8–23)
BUN: 25 mg/dL — ABNORMAL HIGH (ref 8–23)
CO2: 7 mmol/L — ABNORMAL LOW (ref 22–32)
CO2: 17 mmol/L — ABNORMAL LOW (ref 22–32)
Calcium: 7.7 mg/dL — ABNORMAL LOW (ref 8.9–10.3)
Calcium: 7.6 mg/dL — ABNORMAL LOW (ref 8.9–10.3)
Chloride: 101 mmol/L (ref 98–111)
Chloride: 100 mmol/L (ref 98–111)
Creatinine, Ser: 2.1 mg/dL — ABNORMAL HIGH (ref 0.61–1.24)
Creatinine, Ser: 1.92 mg/dL — ABNORMAL HIGH (ref 0.61–1.24)
GFR, Estimated: 34 mL/min — ABNORMAL LOW (ref >60)
GFR, Estimated: 38 mL/min — ABNORMAL LOW (ref >60)
Glucose, Bld: 697 mg/dL (ref 70–99)
Glucose, Bld: 359 mg/dL — ABNORMAL HIGH (ref 70–99)
Potassium: 4.1 mmol/L (ref 3.5–5.1)
Potassium: 2.9 mmol/L — ABNORMAL LOW (ref 3.5–5.1)
Sodium: 135 mmol/L (ref 135–145)
Sodium: 136 mmol/L (ref 135–145)

## 2021-02-03 LAB — CBG MONITORING, ED
Glucose-Capillary: 581 mg/dL (ref 70–99)
Glucose-Capillary: >600 mg/dL (ref 70–99)
Glucose-Capillary: 557 mg/dL (ref 70–99)
Glucose-Capillary: >600 mg/dL (ref 70–99)

## 2021-02-03 LAB — TYPE AND SCREEN
ABO/RH(D): O POS
Antibody Screen: NEGATIVE

## 2021-02-03 LAB — MRSA NEXT GEN BY PCR, NASAL: MRSA by PCR Next Gen: DETECTED — AB

## 2021-02-03 LAB — LACTIC ACID, PLASMA
Lactic Acid, Venous: 6.8 mmol/L (ref 0.5–1.9)
Lactic Acid, Venous: 4.7 mmol/L (ref 0.5–1.9)

## 2021-02-03 LAB — PHOSPHORUS: Phosphorus: 6.4 mg/dL — ABNORMAL HIGH (ref 2.5–4.6)

## 2021-02-03 LAB — RESP PANEL BY RT-PCR (FLU A&B, COVID) ARPGX2
Influenza A by PCR: NEGATIVE
Influenza B by PCR: NEGATIVE
SARS Coronavirus 2 by RT PCR: POSITIVE — AB

## 2021-02-03 LAB — D-DIMER, QUANTITATIVE: D-Dimer, Quant: 1.45 ug/mL-FEU — ABNORMAL HIGH (ref 0.00–0.50)

## 2021-02-03 LAB — TROPONIN I (HIGH SENSITIVITY)
Troponin I (High Sensitivity): 141 ng/L (ref <18)
Troponin I (High Sensitivity): 157 ng/L (ref <18)

## 2021-02-03 LAB — MAGNESIUM: Magnesium: 1.5 mg/dL — ABNORMAL LOW (ref 1.7–2.4)

## 2021-02-03 LAB — APTT: aPTT: 31 seconds (ref 24–36)

## 2021-02-03 LAB — STREP PNEUMONIAE URINARY ANTIGEN: Strep Pneumo Urinary Antigen: NEGATIVE

## 2021-02-03 LAB — BETA-HYDROXYBUTYRIC ACID
Beta-Hydroxybutyric Acid: >8 mmol/L — ABNORMAL HIGH (ref 0.05–0.27)
Beta-Hydroxybutyric Acid: >8 mmol/L — ABNORMAL HIGH (ref 0.05–0.27)

## 2021-02-03 LAB — PROCALCITONIN: Procalcitonin: 0.57 ng/mL

## 2021-02-03 LAB — C-REACTIVE PROTEIN: CRP: 1.9 mg/dL — ABNORMAL HIGH (ref <1.0)

## 2021-02-03 LAB — PROTIME-INR
INR: 1.3 — ABNORMAL HIGH (ref 0.8–1.2)
Prothrombin Time: 15.8 seconds — ABNORMAL HIGH (ref 11.4–15.2)

## 2021-02-03 MED ORDER — POTASSIUM CHLORIDE 10 MEQ/100ML IV SOLN
10.0000 meq | INTRAVENOUS | Status: AC
Start: 2021-02-03 — End: 2021-02-04
  Administered 2021-02-03 – 2021-02-04 (×4): 10 meq via INTRAVENOUS
  Filled 2021-02-03 (×4): qty 100

## 2021-02-03 MED ORDER — NOREPINEPHRINE 4 MG/250ML-% IV SOLN
INTRAVENOUS | Status: AC
  Filled 2021-02-03: qty 250

## 2021-02-03 MED ORDER — SODIUM BICARBONATE 8.4 % IV SOLN: 50.0000 meq | Freq: Once | INTRAVENOUS | Status: AC

## 2021-02-03 MED ORDER — DEXTROSE IN LACTATED RINGERS 5 % IV SOLN: INTRAVENOUS | Status: DC

## 2021-02-03 MED ORDER — HEPARIN SODIUM (PORCINE) 5000 UNIT/ML IJ SOLN
5000.0000 [IU] | Freq: Three times a day (TID) | INTRAMUSCULAR | Status: DC
  Administered 2021-02-03 – 2021-02-12 (×27): 5000 [IU] via SUBCUTANEOUS
  Filled 2021-02-03 (×29): qty 1

## 2021-02-03 MED ORDER — PANTOPRAZOLE SODIUM 40 MG IV SOLR
40.0000 mg | INTRAVENOUS | Status: DC
  Administered 2021-02-04: 40 mg via INTRAVENOUS
  Filled 2021-02-03: qty 40

## 2021-02-03 MED ORDER — LORAZEPAM 1 MG PO TABS: 1.0000 mg | ORAL_TABLET | ORAL | Status: DC | PRN

## 2021-02-03 MED ORDER — INSULIN REGULAR(HUMAN) IN NACL 100-0.9 UT/100ML-% IV SOLN: INTRAVENOUS | Status: DC

## 2021-02-03 MED ORDER — MAGNESIUM SULFATE 2 GM/50ML IV SOLN
2.0000 g | Freq: Once | INTRAVENOUS | Status: AC
  Administered 2021-02-03: 2 g via INTRAVENOUS
  Filled 2021-02-03: qty 50

## 2021-02-03 MED ORDER — SODIUM CHLORIDE 0.9 % IV SOLN
200.0000 mg | Freq: Once | INTRAVENOUS | Status: AC
  Administered 2021-02-03: 200 mg via INTRAVENOUS
  Filled 2021-02-03: qty 40

## 2021-02-03 MED ORDER — SODIUM CHLORIDE 0.9 % IV BOLUS (SEPSIS): 1000.0000 mL | Freq: Once | INTRAVENOUS | Status: DC

## 2021-02-03 MED ORDER — SODIUM CHLORIDE 0.9 % IV SOLN
100.0000 mg | Freq: Every day | INTRAVENOUS | Status: AC
  Administered 2021-02-04 – 2021-02-07 (×4): 100 mg via INTRAVENOUS
  Filled 2021-02-03 (×2): qty 20
  Filled 2021-02-03: qty 100
  Filled 2021-02-03: qty 20
  Filled 2021-02-03: qty 100

## 2021-02-03 MED ORDER — LACTATED RINGERS IV BOLUS
1000.0000 mL | Freq: Once | INTRAVENOUS | Status: AC
  Administered 2021-02-03: 1000 mL via INTRAVENOUS

## 2021-02-03 MED ORDER — NOREPINEPHRINE 4 MG/250ML-% IV SOLN
2.0000 ug/min | INTRAVENOUS | Status: DC
  Administered 2021-02-03: 2 ug/min via INTRAVENOUS
  Administered 2021-02-04: 4 ug/min via INTRAVENOUS
  Filled 2021-02-03: qty 250

## 2021-02-03 MED ORDER — LACTATED RINGERS IV SOLN: INTRAVENOUS | Status: DC

## 2021-02-03 MED ORDER — CHLORHEXIDINE GLUCONATE CLOTH 2 % EX PADS
6.0000 | MEDICATED_PAD | Freq: Every day | CUTANEOUS | Status: DC
  Administered 2021-02-03 – 2021-02-10 (×6): 6 via TOPICAL

## 2021-02-03 MED ORDER — SODIUM BICARBONATE 8.4 % IV SOLN
INTRAVENOUS | Status: AC
  Administered 2021-02-03: 50 meq via INTRAVENOUS
  Filled 2021-02-03: qty 50

## 2021-02-03 MED ORDER — ASCORBIC ACID 500 MG PO TABS
500.0000 mg | ORAL_TABLET | Freq: Every day | ORAL | Status: DC
  Administered 2021-02-04 – 2021-02-12 (×9): 500 mg via ORAL
  Filled 2021-02-03 (×9): qty 1

## 2021-02-03 MED ORDER — DEXTROSE 50 % IV SOLN
0.0000 mL | INTRAVENOUS | Status: DC | PRN
  Administered 2021-02-04: 45 mL via INTRAVENOUS
  Filled 2021-02-03 (×2): qty 50

## 2021-02-03 MED ORDER — VANCOMYCIN HCL 1500 MG/300ML IV SOLN
1500.0000 mg | Freq: Once | INTRAVENOUS | Status: AC
  Administered 2021-02-03: 1500 mg via INTRAVENOUS
  Filled 2021-02-03: qty 300

## 2021-02-03 MED ORDER — METRONIDAZOLE 500 MG/100ML IV SOLN
500.0000 mg | Freq: Once | INTRAVENOUS | Status: AC
  Administered 2021-02-03: 500 mg via INTRAVENOUS
  Filled 2021-02-03: qty 100

## 2021-02-03 MED ORDER — INSULIN REGULAR(HUMAN) IN NACL 100-0.9 UT/100ML-% IV SOLN
INTRAVENOUS | Status: DC
  Administered 2021-02-03: 6.5 [IU]/h via INTRAVENOUS
  Administered 2021-02-04: 6 [IU]/h via INTRAVENOUS
  Filled 2021-02-03 (×2): qty 100

## 2021-02-03 MED ORDER — FLUDROCORTISONE ACETATE 0.1 MG PO TABS
200.0000 ug | ORAL_TABLET | Freq: Every day | ORAL | Status: DC
  Administered 2021-02-04 – 2021-02-12 (×9): 200 ug via ORAL
  Filled 2021-02-03 (×11): qty 2

## 2021-02-03 MED ORDER — DEXTROSE 50 % IV SOLN: 0.0000 mL | INTRAVENOUS | Status: DC | PRN

## 2021-02-03 MED ORDER — THIAMINE HCL 100 MG/ML IJ SOLN
100.0000 mg | Freq: Every day | INTRAMUSCULAR | Status: DC
  Administered 2021-02-03 – 2021-02-05 (×3): 100 mg via INTRAVENOUS
  Filled 2021-02-03 (×3): qty 2

## 2021-02-03 MED ORDER — STERILE WATER FOR INJECTION IV SOLN
INTRAVENOUS | Status: DC
  Filled 2021-02-03: qty 150
  Filled 2021-02-03 (×2): qty 1000
  Filled 2021-02-03: qty 150
  Filled 2021-02-03: qty 1000

## 2021-02-03 MED ORDER — ADULT MULTIVITAMIN W/MINERALS CH
1.0000 | ORAL_TABLET | Freq: Every day | ORAL | Status: DC
  Administered 2021-02-04 – 2021-02-12 (×9): 1 via ORAL
  Filled 2021-02-03 (×9): qty 1

## 2021-02-03 MED ORDER — VANCOMYCIN VARIABLE DOSE PER UNSTABLE RENAL FUNCTION (PHARMACIST DOSING): Status: DC

## 2021-02-03 MED ORDER — SODIUM CHLORIDE 0.9 % IV SOLN
2.0000 g | Freq: Two times a day (BID) | INTRAVENOUS | Status: DC
  Administered 2021-02-04 – 2021-02-07 (×8): 2 g via INTRAVENOUS
  Filled 2021-02-03 (×10): qty 2

## 2021-02-03 MED ORDER — FOLIC ACID 1 MG PO TABS
1.0000 mg | ORAL_TABLET | Freq: Every day | ORAL | Status: DC
  Administered 2021-02-04 – 2021-02-12 (×9): 1 mg via ORAL
  Filled 2021-02-03 (×9): qty 1

## 2021-02-03 MED ORDER — SODIUM CHLORIDE 0.9 % IV SOLN
2.0000 g | Freq: Once | INTRAVENOUS | Status: AC
  Administered 2021-02-03: 2 g via INTRAVENOUS
  Filled 2021-02-03: qty 2

## 2021-02-03 MED ORDER — LORAZEPAM 2 MG/ML IJ SOLN: 1.0000 mg | INTRAMUSCULAR | Status: DC | PRN

## 2021-02-03 MED ORDER — ZINC SULFATE 220 (50 ZN) MG PO CAPS
220.0000 mg | ORAL_CAPSULE | Freq: Every day | ORAL | Status: DC
  Administered 2021-02-04 – 2021-02-12 (×9): 220 mg via ORAL
  Filled 2021-02-03 (×9): qty 1

## 2021-02-03 NOTE — Consult Note (Signed)
PHARMACY CONSULT NOTE - FOLLOW UP  Pharmacy Consult for Electrolyte Monitoring and Replacement   Recent Labs: Potassium (mmol/L)  Date Value  01/09/2021 2.9 (L)   Magnesium (mg/dL)  Date Value  01/19/2021 1.5 (L)   Calcium (mg/dL)  Date Value  01/19/2021 7.6 (L)   Albumin (g/dL)  Date Value  01/28/2021 2.5 (L)   Phosphorus (mg/dL)  Date Value  01/16/2021 6.4 (H)   Sodium (mmol/L)  Date Value  01/12/2021 136     Assessment: 65 y.o. male with a past medical history of type 1 diabetes, COPD, hypertension, and hyperlipidemia who presents with altered mental status.    Pt is currently on an insulin drip with bicarb drip, norepinephrine drip, and fluids.   K: 4.1>2.9 (replacing now) Mg: 1.5>NNL (replaced; check in AM) Phos 6.4>NNL  Goal of Therapy:  Electrolytes wnl's  Plan:  Will give KCL IV 67meq q1h x4 Will trend BMP on insulin drip Will follow mg/phos with am labs  Lorna Dibble ,PharmD Clinical Pharmacist 01/23/2021 10:42 PM

## 2021-02-03 NOTE — Progress Notes (Signed)
Inpatient Diabetes Program Recommendations  AACE/ADA: New Consensus Statement on Inpatient Glycemic Control (2015)  Target Ranges:  Prepandial:   less than 140 mg/dL      [facility]ak postprandial:   less than 180 mg/dL (1-2 hours)      Critically ill patients:  140 - 180 mg/dL  Results for DAN, SCEARCE (MRN [id number]) as of 01/21/2021 14:54  Ref. Range 01/09/2021 13:38  Sodium Latest Ref Range: 135 - 145 mmol/L 133 (L)  Potassium Latest Ref Range: 3.5 - 5.1 mmol/L 5.1  Chloride Latest Ref Range: 98 - 111 mmol/L 100  CO2 Latest Ref Range: 22 - 32 mmol/L <7 (L)  Glucose Latest Ref Range: 70 - 99 mg/dL 772 (HH)  BUN Latest Ref Range: 8 - 23 mg/dL 25 (H)  Creatinine Latest Ref Range: 0.61 - 1.24 mg/dL 2.10 (H)  Calcium Latest Ref Range: 8.9 - 10.3 mg/dL 8.1 (L)  Anion gap Latest Ref Range: 5 - 15  NOT CALCULATED  Results for ORAN, DILLENBURG (MRN [id number]) as of 02/01/2021 14:54  Ref. Range 01/15/2021 13:38  Beta-Hydroxybutyric Acid Latest Ref Range: 0.05 - 0.27 mmol/L >8.00 (H)  Results for ISABELLA, IDA (MRN [id number]) as of 02/05/2021 14:54  Ref. Range 01/23/2021 06:49  Hemoglobin A1C Latest Ref Range: 4.8 - 5.6 % 5.2    To ED with AMS/ Hypoxia/ DKA  History: T1DM  SNF Meds: Lantus 6 units QHS        Humalog 0-9 units TID with meals per SSI        Humalog 3 units TID with meals for carbohydrate coverage  Current Orders: IV Insulin Drip     Just discharged to [facility]/28/2021 after admission on 01/20/2021 for Sepsis, Pneumonia, and Severe Hypoglycemia.  Was using Insulin Pump with TSlim pump + Dexcom sensor prior to last admission.  Dexcom sensor was found to be expired during last admission and was not working.    Upon discharge to SNF, Insulin Pump was stopped and pt discahrged to SNF with Rxs for Lantus 6 units QHS + Humalog 0-9 units TID with meals per SSI + Humalog 3 units TID with meals for carbohydrate coverage.   Note orders for IV Insulin Drip placed--Agree. Will  follow    --Will follow patient during hospitalization--  Wyn Quaker RN, MSN, CDE Diabetes Coordinator Inpatient Glycemic Control Team Team Pager: [phone] (8a-5p)

## 2021-02-03 NOTE — Procedures (Signed)
Central Venous Catheter Insertion Procedure Note  HARCE VOLDEN  [id number]  Dec 15, 1955  Date:01/15/2021  Time:5:58 PM   Provider Performing:Jeanpaul Biehl D Dewaine Conger   Procedure: Insertion of Non-tunneled Central Venous [phone]) with US guidance ([id number])   Indication(s) Medication administration and Difficult access  Consent Risks of the procedure as well as the alternatives and risks of each were explained to the patient and/or caregiver.  Consent for the procedure was obtained and is signed in the bedside chart  Anesthesia Topical only with 1% lidocaine   Timeout Verified patient identification, verified procedure, site/side was marked, verified correct patient position, special equipment/implants available, medications/allergies/relevant history reviewed, required imaging and test results available.  Sterile Technique Maximal sterile technique including full sterile barrier drape, hand hygiene, sterile gown, sterile gloves, mask, hair covering, sterile ultrasound probe cover (if used).  Procedure Description Area of catheter insertion was cleaned with chlorhexidine and draped in sterile fashion.  With real-time ultrasound guidance a central venous catheter was placed into the left femoral vein. Nonpulsatile blood flow and easy flushing noted in all ports.  The catheter was sutured in place and sterile dressing applied.  Complications/Tolerance None; patient tolerated the procedure well. Chest X-ray is ordered to verify placement for internal jugular or subclavian cannulation.   Chest x-ray is not ordered for femoral cannulation.  EBL Minimal  Specimen(s) None   Line secured at the 20 cm mark.  BIOPATCH applied to the insertion site.     Darel Hong, AGACNP-BC [facility] Prefer epic messenger for cross cover needs If after hours, please call E-link

## 2021-02-03 NOTE — ED Notes (Signed)
Phlebotomy at bedside.

## 2021-02-03 NOTE — H&P (Signed)
NAME:  BRADY SCHILLER, MRN:  [id number], DOB:  [birth year], LOS: 0 ADMISSION DATE:  01/29/2021, CONSULTATION DATE:  01/31/2021 REFERRING MD:  Dr. Starleen Blue, CHIEF COMPLAINT:  Altered Mental Status & Hyperglycemia   Brief Pt Description / Synopsis:  65 year old male admitted with severe DKA and septic shock due to to suspected healthcare associated pneumonia vs. Aspiration pneumonia requiring Levophed and bicarb drips.  History of Present Illness:  Maven Varelas is a 65 year old male with a past medical history significant for type 1 diabetes mellitus, COPD, hypertension, hyperlipidemia who presented to [facility] ED on 02/03/2022 due to complaints of altered mental status.  Patiently is currently altered, therefore history is obtained from ED nursing notes along with wife's report at bedside.  He was recently admitted and treated at [facility] from 01/20/21 through 01/28/21 for Severe Sepsis in the setting of aspiration pneumonia, and was discharged to a skilled nursing facility.  Today at the facility his blood sugar was elevated and he was noted to be altered so they dispatched EMS.  ED Course: Initial vital signs: 92.8 Fahrenheit core, respiratory rate 31, pulse 132, blood pressure 70/56, 100% SPO2 on 2 L nasal cannula Significant labs: Sodium 133, bicarb less than 7, glucose 772, anion gap not calculated, BUN 25, creatinine 2.1, albumin 2.5, high-sensitivity troponin 141, lactic acid 6.8, WBC 17.4 with neutrophilia, hemoglobin 9.0, platelets 491, PT 15.8, INR 1.3, beta hydroxybutyrate acid >8 Urinalysis: positive for ketones, not consistent with UTI VBG: pH <6.9 / pCO2 26 / pO2 45/ Bicarb 4.4 Imaging: Chest x-ray>>1. Worsening opacity in the left base with suspected trace left pleural effusion raises suspicion for developing left lower lobe pneumonia. 2. Persistent hazy opacities in the right lower lobe with a small right pleural effusion suggesting ongoing right basilar infection. 3. Recommend continued  radiographic follow up to resolution. Upon presentation to the ED he remained altered, was hypothermic, tachycardic, tachypneic, and hypotensive with blood pressures 70s over 50s.  He appeared mottled but was protecting his airway. EKG Tachycardia, no clear P waves, left axis deviation, poor R wave progression, prolonged QTc interval, no acute ischemic changes   He met criteria for DKA and sepsis.  Therefore he received 2 L of LR boluses (received 500 cc with EMS), was placed on insulin drip, along with broad-spectrum antibiotics including cefepime, Flagyl, vancomycin.  Given his severe metabolic acidosis he was given 1 amp of bicarb and started on bicarb drip.  Despite IV fluid resuscitation he remained hypotensive requiring Levophed drip.  PCCM is asked to admit the patient to ICU for further work-up and treatment of severe DKA and septic shock due to suspected healthcare associated pneumonia.  Pertinent  Medical History  Diabetes mellitus type 1 COPD Hypertension Hyperlipidemia Peripheral vascular disease GERD Multiple duodenal ulcers Malignant neoplasm of colon Colon polyps Hip fracture T12 compression fracture Parkinson's disease Retinopathy  Micro Data:  01/24/2021: SARS-CoV-2 and influenza PCR>> 01/24/2021: Blood culture x2>> 01/10/2021: Urine>> 01/28/2021: Strep pneumo urinary antigen>> 01/13/2021: Legionella urine antigen>>  Antimicrobials:  Cefepime 9/26>> Vancomycin 9/26>> Flagyl 9/26 x 1 dose  Significant Hospital Events: Including procedures, antibiotic start and stop dates in addition to other pertinent events   01/26/2021: PCCM asked to admit for severe DKA and septic shock requiring Levophed and bicarb drips  Interim History / Subjective:  -Presents to ED with altered mental status and hyperglycemia -Rules in for DKA and septic shock -Remains lethargic and altered, however currently protecting his airway -Requiring Levophed drip -Placed on bicarb drip due to  severe acidosis with pH less than 6.9 -Will need central line placement due to multiple infusions, vasopressors, and frequent lab draws ~patient's wife has consented for placement  Objective   Blood pressure 105/62, pulse (!) 130, temperature (!) 95.4 F (35.2 C), resp. rate (!) 35, weight 65.1 kg, SpO2 100 %.        Intake/Output Summary (Last 24 hours) at 01/13/2021 1606 Last data filed at 01/17/2021 1458 Gross per 24 hour  Intake 2000 ml  Output --  Net 2000 ml   Filed Weights   01/22/2021 1438  Weight: 65.1 kg    Examination: General: Critically ill-appearing male, laying in bed, on 2 L nasal cannula and protecting his airway, no acute distress HENT: Atraumatic, normocephalic, neck supple, no JVD Lungs: Tachypneic with Kussmaul's respirations, even, clear breath sounds bilaterally, no wheezing Cardiovascular: Tachycardia, regular rhythm, S1-S2, no murmurs, rubs, gallops Abdomen: Soft, nontender, nondistended, no guarding rebound tenderness Extremities: No deformities, edema bilateral lower extremities Neuro: Lethargic, will arouse to voice/light touch, mumbled speech, does not follow commands, does withdraw from pain GU: External catheter in place  Resolved Hospital Problem list     Assessment & Plan:   Severe DKA, likely precipitated by Sepsis PMHx of Diabetes Mellitus Type I -Follow DKA protocol -Aggressive IV fluids -Insulin drip -Follow BMP every 4 hours -Once anion gap closed and serum bicarb greater than 20, can convert to long-acting insulin and sliding scale insulin SQ -Check hemoglobin A1c -Consult diabetes coronary, appreciate input  Multifactorial Shock (Hypovolemic due to DKA, Septic due to suspected Pneumonia) Mildly Elevated Troponin, suspect demand ischemia PMHx of Hypertension, peripheral vascular disease -Continuous cardiac monitoring -Maintain MAP >65 -IV fluids -Vasopressors as needed to maintain MAP goal -Trend lactic acid until normalized (6.8  ~ ) -Trend HS Troponin until peaked (141 ~ 157) -Echocardiogram on 01/26/21 with LVEF 98-26%, Grade I Diastolic dysfunction, normal RV systolic function  Severe Sepsis due to suspected Healthcare Associated Pneumonia vs Aspiration -Monitor fever curve -Trend WBC's & Procalcitonin -Follow cultures as above -Continue empiric Cefepime & Vancomycin pending cultures & sensitivities -Once more stable, obtain CT Chest to exclude empyema as he was  recently treated for PNA   Acute Kidney Injury Hypertonic Hyponatremia due to severely elevated glucose with DKA Severe Anion Gap Metabolic Acidosis in setting of DKA & Lactic Acidosis -Monitor I&O's / urinary output -Follow BMP -Ensure adequate renal perfusion -Avoid nephrotoxic agents as able -Replace electrolytes as indicated -IV Fluids -Bicarb gtt -Trend lactic acid  Acute Metabolic Encephalopathy in setting of DKA and Sepsis Reported History of ETOH abuse (reportedly drinks about 42 oz beer/day) High risk for Delirium Tremens PMHx of Parkinson's disease -Treat DKA and sepsis -Provide supportive care -Avoid sedating meds as able -CIWA protocol -Thiamine, Folic acid, MV -If no improvement in mental status once precipitating factors treated, will need CT Head -Can resume home Sinemet once mental status improves  COPD without acute exacerbation -Supplemental O2 as needed to maintain O2 sats greater than 88% -Currently protecting his airway, but remains high risk for intubation -Follow intermittent chest x-ray and ABG as needed -As needed bronchodilators  Anemia without s/sx of overt blood loss -Monitor for S/Sx of bleeding -Trend CBC -Heparin SQ for VTE Prophylaxis  -Transfuse for Hgb <7  Best Practice (right click and "Reselect all SmartList Selections" daily)   Diet/type: NPO DVT prophylaxis: prophylactic heparin  GI prophylaxis: PPI Lines: N/A Foley:  N/A Code Status:  full code Last date of multidisciplinary goals of care  discussion [N/A]  Dr. Mortimer Fries updated pt's wife at bedside 01/21/2021.  Labs   CBC: Recent Labs  Lab 01/28/21 0402 01/10/2021 1338  WBC  --  17.4*  NEUTROABS  --  13.6*  HGB 9.0* 9.0*  HCT 27.3* 31.0*  MCV  --  114.8*  PLT  --  491*    Basic Metabolic Panel: Recent Labs  Lab 01/28/21 0402 01/18/2021 1338  NA 134* 133*  K 4.7 5.1  CL 103 100  CO2 25 <7*  GLUCOSE 144* 772*  BUN 7* 25*  CREATININE 0.85 2.10*  CALCIUM 8.0* 8.1*  MG 1.8  --   PHOS 2.3*  --    GFR: Estimated Creatinine Clearance: 31.9 mL/min (A) (by C-G formula based on SCr of 2.1 mg/dL (H)). Recent Labs  Lab 01/28/2021 1338  WBC 17.4*  LATICACIDVEN 6.8*    Liver Function Tests: Recent Labs  Lab 01/28/21 0402 01/16/2021 1338  AST  --  29  ALT  --  <5  ALKPHOS  --  105  BILITOT  --  1.9*  PROT  --  5.2*  ALBUMIN 2.2* 2.5*   No results for input(s): LIPASE, AMYLASE in the last 168 hours. No results for input(s): AMMONIA in the last 168 hours.  ABG    Component Value Date/Time   PHART 7.56 (H) 10/14/2017 0739   PCO2ART 25 (L) 10/14/2017 0739   PO2ART 111 (H) 10/14/2017 0739   HCO3 4.4 (L) 01/20/2021 1342   ACIDBASEDEF 27.9 (H) 01/29/2021 1342   O2SAT 40.6 02/07/2021 1342     Coagulation Profile: Recent Labs  Lab 01/25/2021 1338  INR 1.3*    Cardiac Enzymes: No results for input(s): CKTOTAL, CKMB, CKMBINDEX, TROPONINI in the last 168 hours.  HbA1C: Hgb A1c MFr Bld  Date/Time Value Ref Range Status  01/23/2021 06:49 AM 5.2 4.8 - 5.6 % Final    Comment:    (NOTE) Pre diabetes:          5.7%-6.4%  Diabetes:              >6.4%  Glycemic control for   <7.0% adults with diabetes   03/21/2020 10:20 PM 6.6 (H) 4.8 - 5.6 % Final    Comment:    (NOTE) Pre diabetes:          5.7%-6.4%  Diabetes:              >6.4%  Glycemic control for   <7.0% adults with diabetes     CBG: Recent Labs  Lab 01/27/21 1655 01/27/21 2000 01/27/21 2357 01/28/21 0726 01/28/21 1204  GLUCAP 152* 134*  128* 185* 190*    Review of Systems:   Unable to assess due to altered mental status  Past Medical History:  He,  has a past medical history of Cancer ([place]), Colon polyps, COPD (chronic obstructive pulmonary disease) ([place]), Diabetes mellitus without complication ([place]), GERD (gastroesophageal reflux disease), Hip fracture ([place]), History of malignant neoplasm of colon, Hypertension, Multiple duodenal ulcers, Parkinson disease ([place]), Peripheral vascular disease ([place]), Retinopathy, and T12 compression fracture ([place]) (11/2020).   Surgical History:   Past Surgical History:  Procedure Laterality Date   ANKLE ARTHROPLASTY Right 12/2011   COLONOSCOPY     1998, 1998, 1999, 2002, 2006, 2009, 2013, 2018   COLONOSCOPY WITH PROPOFOL N/A 10/12/2016   Procedure: COLONOSCOPY WITH PROPOFOL;  Surgeon: Manya Silvas, MD;  Location: [facility] ENDOSCOPY;  Service: Endoscopy;  Laterality: N/A;   ESOPHAGOGASTRODUODENOSCOPY  07/09/2011   FINGER SURGERY Right 09/2007  thumb repair due to crushing injury; metal hardware   [place] (IM) NAIL INTERTROCHANTERIC Left 08/08/2018   Procedure: INTRAMEDULLARY (IM) NAIL INTERTROCHANTRIC;  Surgeon: Corky Mull, MD;  Location: [facility] ORS;  Service: Orthopedics;  Laterality: Left;   KYPHOPLASTY N/A 12/12/2020   Procedure: T 12 KYPHOPLASTY;  Surgeon: Hessie Knows, MD;  Location: [facility] ORS;  Service: Orthopedics;  Laterality: N/A;     Social History:   reports that he quit smoking about 15 years ago. His smoking use included cigarettes. He has never used smokeless tobacco. He reports current alcohol use of about 21.0 standard drinks per week. He reports that he does not currently use drugs after having used the following drugs: Marijuana.   Family History:  His family history includes Alzheimer's disease in his father; Diabetes in his brother; Stroke in his mother.   Allergies Allergies  Allergen Reactions   Penicillins Rash and Other  (See Comments)    Has patient had a PCN reaction causing immediate rash, facial/tongue/throat swelling, SOB or lightheadedness with hypotension: Yes Has patient had a PCN reaction causing severe rash involving mucus membranes or skin necrosis: No Has patient had a PCN reaction that required hospitalization: No Has patient had a PCN reaction occurring within the last 10 years: No If all of the above answers are "NO", then may proceed with Cephalosporin use. Other reaction(s): RASH Has patient had a PCN reaction causing immediate rash, facial/tongue/throat swelling, SOB or lightheadedness with hypotension: Yes Has patient had a PCN reaction causing severe rash involving mucus membranes or skin necrosis: No Has patient had a PCN reaction that required hospitalization: No Has patient had a PCN reaction occurring within the last 10 years: No If all of the above answers are "NO", then may proceed with Cephalosporin use. Reaction:  Unknown; childhood reaction  Has patient had a PCN reaction causing immediate rash, facial/tongue/throat swelling, SOB or lightheadedness with hypotension: Yes Has patient had a PCN reaction causing severe rash involving mucus membranes or skin necrosis: No Has patient had a PCN reaction that required hospitalization: No Has patient had a PCN reaction occurring within the last 10 years: No If all of the above answers are "NO", then may proceed with Cephalosporin use.   Sulfa Antibiotics Rash    Katherina Right Syndrome (SJS)      Home Medications  Prior to Admission medications   Medication Sig Start Date End Date Taking? Authorizing Provider  carbidopa-levodopa (SINEMET CR) 50-200 MG tablet Take 1 tablet by mouth at bedtime.    Yes [provider]  carbidopa-levodopa (SINEMET IR) 25-250 MG tablet Take 1 tablet by mouth 4 (four) times daily.    Yes [provider]  fludrocortisone (FLORINEF) 0.1 MG tablet Take 200 mcg by mouth daily. 09/20/20  Yes  [provider]  folic acid (FOLVITE) 767 MCG tablet Take 800 mcg by mouth daily.    Yes [provider]  insulin glargine (LANTUS) 100 UNIT/ML injection Inject 0.06 mLs (6 Units total) into the skin at bedtime. 01/28/21  Yes Mercy Riding, MD  insulin lispro (HUMALOG) 100 UNIT/ML cartridge Inject 0-0.09 mLs (0-9 Units total) into the skin 3 (three) times daily with meals. 0-9 Units, Subcutaneous, 3 times daily with meals, First dose on Sun 01/26/21 at 1700 Correction coverage: Sensitive (thin, NPO, renal) CBG < 70: Implement Hypoglycemia Standing Orders and refer to Hypoglycemia Standing Orders sidebar report CBG 70 - 120: 0 units CBG 121 - 150: 1 unit  CBG 151 - 200: 2 units CBG 201 - 250: 3 units CBG 251 - 300: 5 units CBG 301 - 350: 7 units CBG 351 - 400: 9 units CBG > 400: call MD and obtain STAT lab verification 01/28/21  Yes Gonfa, Taye T, MD  insulin lispro (HUMALOG) 100 UNIT/ML cartridge Inject 0.03 mLs (3 Units total) into the skin 3 (three) times daily with meals. Hold if not completing more than 50% of his meals or CBG <100 01/28/21  Yes Gonfa, Taye T, MD  lipase/protease/amylase (CREON) 36000 UNITS CPEP capsule Take 1 capsule (36,000 Units total) by mouth 3 (three) times daily before meals. 01/28/21  Yes Mercy Riding, MD  magnesium oxide (MAG-OX) 400 MG tablet Take 400 mg by mouth daily.   Yes [provider]  melatonin 5 MG TABS Take 1 tablet (5 mg total) by mouth at bedtime. 01/28/21  Yes Mercy Riding, MD  midodrine (PROAMATINE) 5 MG tablet Take 1 tablet (5 mg total) by mouth 3 (three) times daily with meals. 01/28/21  Yes Mercy Riding, MD  Multiple Vitamin (MULTIVITAMIN WITH MINERALS) TABS tablet Take 1 tablet by mouth daily.   Yes [provider]  omeprazole (PRILOSEC) 40 MG capsule Take 40 mg by mouth at bedtime.   Yes [provider]  rivastigmine (EXELON) 1.5 MG capsule Take 1.5 mg by mouth 2 (two) times daily.   Yes [provider]   thiamine 100 MG tablet Take 1 tablet (100 mg total) by mouth daily. 01/28/21  Yes Mercy Riding, MD  acetaminophen (TYLENOL) 325 MG tablet Take 2 tablets (650 mg total) by mouth every 6 (six) hours as needed for mild pain, fever or headache. 01/28/21   Mercy Riding, MD  feeding supplement (ENSURE ENLIVE / ENSURE PLUS) LIQD Take 237 mLs by mouth daily. 01/28/21   Mercy Riding, MD  ipratropium-albuterol (DUONEB) 0.5-2.5 (3) MG/3ML SOLN Take 3 mLs by nebulization every 6 (six) hours as needed (Shortness of breath, cough and/or wheeze). 01/28/21   Mercy Riding, MD     Critical care time: 55 minutes     Darel Hong, AGACNP-BC [facility] Prefer epic messenger for cross cover needs If after hours, please call E-link

## 2021-02-03 NOTE — Progress Notes (Addendum)
BRIEF CRITICAL CARE NOTE  Pt's COVID-19 PCR came back POSITIVE.  Updated pt's wife of positive results.  Patient was treated here 2 weeks ago for PNA and was discharged to [facility], and [facility].  He has had no contacts other than staff at the facilities.  She gives consent for Remdesivir.  ASSESSMENT / PLAN  Severe Sepsis due to COVID-19 Infection with suspected superimposed Healthcare Associated Pneumonia vs. Aspiration Pneumonia  -Monitor fever curve -Trend WBC's & Procalcitonin -Follow cultures as above -Continue empiric Cefepime & Vancomycin pending cultures & sensitivities -Start Remdesivir, plan for 5 day course -Trend inflammatory markers: CRP, Ferritin, D-dimer -Will continue home Fludrocortisone for now -Vitamin C & Zinc -Maintain Airborne and contact precautions -Maintain Euvovolemia to net negative fluid balance as able -Self proning as tolerated     Darel Hong, AGACNP-BC [facility] Prefer epic messenger for cross cover needs If after hours, please call E-link

## 2021-02-03 NOTE — Progress Notes (Signed)
GOALS OF CARE DISCUSSION  The Clinical status was relayed to family in detail. Wife at Bedside Updated and notified of patients medical condition.    Patient remains unresponsive and will not open eyes to command.   Patient with increased WOB and using accessory muscles to breathe Explained to family course of therapy and the modalities    Patient with Progressive multiorgan failure with a very high probablity of a very minimal chance of meaningful recovery despite all aggressive and optimal medical therapy.  PATIENT REMAINS FULL CODE  Family understands the situation.  Family are satisfied with Plan of action and management. All questions answered  Additional CC time 20 mins   Lonza Shimabukuro Patricia Pesa, M.D.  [facility]  Medical Director [place] Director [facility] Cardio-Pulmonary Department

## 2021-02-03 NOTE — ED Notes (Signed)
CRITICAL VALUE STICKER  CRITICAL VALUE: BG [id number], trop 141  RECEIVER (on-site recipient of call):I.Aydrien Froman  DATE & TIME NOTIFIED: 09/26  MESSENGER (representative from lab):  MD NOTIFIED: [place]  RESPONSE:

## 2021-02-03 NOTE — ED Triage Notes (Signed)
Pt arrives via EMS from [facility] for worsening altered mental status, hypoxia and hyperglycemia. Pt was recently treated for pneumonia. EMS reports BG reading of high, o2 sat in the 80s on room air and a temp of 96.1. patient is only oriented to name.

## 2021-02-03 NOTE — ED Notes (Signed)
No adjustment to insulin drip per endotool. Insulin remains at 6.5  unit/hour

## 2021-02-03 NOTE — ED Notes (Signed)
This rn contacted lab once again to request phlebotomy for blood culture draw due to difficult stick.

## 2021-02-03 NOTE — ED Notes (Signed)
Phlebotomy contacted to come draw blood cultures

## 2021-02-03 NOTE — ED Provider Notes (Signed)
[facility]  ____________________________________________   Event Date/Time   First MD Initiated Contact with Patient 01/16/2021 1340     (approximate)  I have reviewed the triage vital signs and the nursing notes.   HISTORY  Chief Complaint Altered Mental Status and Hyperglycemia    HPI John Reilly is a 65 y.o. male past medical history of type 1 diabetes, COPD, hypertension hyperlipidemia who presents with altered mental status.  Patient was recently admitted for pneumonia, he was discharged to a nursing facility.  Today at his facility his blood sugar was elevated and he was noted to be altered so they called EMS.  I am unable to obtain additional history from the patient given his altered mental status.         Past Medical History:  Diagnosis Date   Cancer ([place])    Pt reports on 10/02/17 that he has never been dx with cancer   Colon polyps    COPD (chronic obstructive pulmonary disease) ([place])    Diabetes mellitus without complication ([place])    type 1   GERD (gastroesophageal reflux disease)    Hip fracture (HCC)    History of malignant neoplasm of colon    Hypertension    Multiple duodenal ulcers    Parkinson disease ([place])    Peripheral vascular disease ([place])    Peripheral Neuropathy   Retinopathy    T12 compression fracture ([place]) 11/2020    Patient Active Problem List   Diagnosis Date Noted   DKA (diabetic ketoacidosis) ([place]) 01/13/2021   Protein-calorie malnutrition, severe 01/22/2021   Hypoglycemia 01/20/2021   Type I diabetes mellitus ([place]) 26/94/8546   Acute metabolic encephalopathy 27/07/5007   COPD (chronic obstructive pulmonary disease) ([place])    Hypertension    Orthostatic hypotension    Parkinson disease ([place])    Hypothermia    Aspiration pneumonia ([place])    Alcohol abuse    Sepsis ([place])    AMS (altered mental status) 03/21/2020   Hypoglycemia due to insulin 03/21/2020   Type 2 diabetes mellitus with vascular disease  ([place]) 03/11/2020   Pain due to onychomycosis of toenails of both feet 09/07/2019   Dermatitis 09/07/2019   S/p left hip fracture 08/10/2018   Altered mental status    Closed fracture of left hip ([place])    Fall    DKA (diabetic ketoacidoses) 10/13/2017   Malnutrition of moderate degree 10/04/2017   Humeral fracture 10/03/2017   Ulcer of toe ([place]) 04/27/2015   Hyponatremia 03/15/2015    Past Surgical History:  Procedure Laterality Date   ANKLE ARTHROPLASTY Right 12/2011   COLONOSCOPY     1998, [place], 2002, 2006, 2009, 2013, 2018   COLONOSCOPY WITH PROPOFOL N/A 10/12/2016   Procedure: COLONOSCOPY WITH PROPOFOL;  Surgeon: Manya Silvas, MD;  Location: [facility] END[facility]COPY;  Service: Endoscopy;  Laterality: N/A;   ESOPHAGOGASTRODUODENOSCOPY  07/09/2011   FINGER SURGERY Right 09/2007   thumb repair due to crushing injury; metal hardware   [place] (IM) NAIL INTERTROCHANTERIC Left 08/08/2018   Procedure: INTRAMEDULLARY (IM) NAIL INTERTROCHANTRIC;  Surgeon: Corky Mull, MD;  Location: [facility] ORS;  Service: Orthopedics;  Laterality: Left;   KYPHOPLASTY N/A 12/12/2020   Procedure: T 12 KYPHOPLASTY;  Surgeon: Hessie Knows, MD;  Location: [facility] ORS;  Service: Orthopedics;  Laterality: N/A;    Prior to Admission medications   Medication Sig Start Date End Date Taking? Authorizing Provider  carbidopa-levodopa (SINEMET CR) 50-200 MG tablet  Take 1 tablet by mouth at bedtime.    Yes [provider]  carbidopa-levodopa (SINEMET IR) 25-250 MG tablet Take 1 tablet by mouth 4 (four) times daily.    Yes [provider]  fludrocortisone (FLORINEF) 0.1 MG tablet Take 200 mcg by mouth daily. 09/20/20  Yes [provider]  folic acid (FOLVITE) 841 MCG tablet Take 800 mcg by mouth daily.    Yes [provider]  insulin glargine (LANTUS) 100 UNIT/ML injection Inject 0.06 mLs (6 Units total) into the skin at bedtime. 01/28/21  Yes Mercy Riding, MD  insulin lispro (HUMALOG) 100 UNIT/ML cartridge Inject 0-0.09 mLs (0-9 Units total) into the skin 3 (three) times daily with meals. 0-9 Units, Subcutaneous, 3 times daily with meals, First dose on Sun 01/26/21 at 1700 Correction coverage: Sensitive (thin, NPO, renal) CBG < 70: Implement Hypoglycemia Standing Orders and refer to Hypoglycemia Standing Orders sidebar report CBG 70 - 120: 0 units CBG 121 - 150: 1 unit CBG 151 - 200: 2 units CBG 201 - 250: 3 units CBG 251 - 300: 5 units CBG 301 - 350: 7 units CBG 351 - 400: 9 units CBG > 400: call MD and obtain STAT lab verification 01/28/21  Yes Gonfa, Taye T, MD  insulin lispro (HUMALOG) 100 UNIT/ML cartridge Inject 0.03 mLs (3 Units total) into the skin 3 (three) times daily with meals. Hold if not completing more than 50% of his meals or CBG <100 01/28/21  Yes Gonfa, Taye T, MD  lipase/protease/amylase (CREON) 36000 UNITS CPEP capsule Take 1 capsule (36,000 Units total) by mouth 3 (three) times daily before meals. 01/28/21  Yes Mercy Riding, MD  magnesium oxide (MAG-OX) 400 MG tablet Take 400 mg by mouth daily.   Yes [provider]  melatonin 5 MG TABS Take 1 tablet (5 mg total) by mouth at bedtime. 01/28/21  Yes Mercy Riding, MD  midodrine (PROAMATINE) 5 MG tablet Take 1 tablet (5 mg total) by mouth 3 (three) times daily with meals. 01/28/21  Yes Mercy Riding, MD  Multiple Vitamin (MULTIVITAMIN WITH MINERALS) TABS tablet Take 1 tablet by mouth daily.   Yes [provider]  omeprazole (PRILOSEC) 40 MG capsule Take 40 mg by mouth at bedtime.   Yes [provider]  rivastigmine (EXELON) 1.5 MG capsule Take 1.5 mg by mouth 2 (two) times daily.   Yes [provider]  thiamine 100 MG tablet Take 1 tablet (100 mg total) by mouth daily. 01/28/21  Yes Mercy Riding, MD  acetaminophen (TYLENOL) 325 MG tablet Take 2 tablets (650 mg total) by mouth every 6 (six) hours as needed for mild pain, fever or headache. 01/28/21    Mercy Riding, MD  feeding supplement (ENSURE ENLIVE / ENSURE PLUS) LIQD Take 237 mLs by mouth daily. 01/28/21   Mercy Riding, MD  ipratropium-albuterol (DUONEB) 0.5-2.5 (3) MG/3ML SOLN Take 3 mLs by nebulization every 6 (six) hours as needed (Shortness of breath, cough and/or wheeze). 01/28/21   Mercy Riding, MD    Allergies Penicillins and Sulfa antibiotics  Family History  Problem Relation Age of Onset   Stroke Mother    Alzheimer's disease Father    Diabetes Brother     Social History Social History   Tobacco Use   Smoking status: Former    Types: Cigarettes    Quit date: 10/21/2005    Years since quitting: 15.2   Smokeless tobacco: Never  Vaping Use  Vaping Use: Never used  Substance Use Topics   Alcohol use: Yes    Alcohol/week: 21.0 standard drinks    Types: 21 Cans of beer per week    Comment: 1 40ounce beer a day   Drug use: Not Currently    Types: Marijuana    Review of Systems   Review of Systems  Unable to perform ROS: Mental status change   Physical Exam Updated Vital Signs BP 105/62   Pulse (!) 130   Temp (!) 95.4 F (35.2 C)   Resp (!) 35   Wt 65.1 kg   SpO2 100%   BMI 20.59 kg/m   Physical Exam Vitals and nursing note reviewed.  Constitutional:      General: He is not in acute distress.    Appearance: He is ill-appearing and toxic-appearing.     Comments: Patient appears ill, he is pale, extremities are cyanotic and mottled  HENT:     Head: Normocephalic and atraumatic.  Eyes:     General: No scleral icterus.    Conjunctiva/sclera: Conjunctivae normal.  Cardiovascular:     Rate and Rhythm: Regular rhythm. Tachycardia present.  Pulmonary:     Effort: Respiratory distress present.     Breath sounds: Normal breath sounds. No wheezing.     Comments: Patient is tachypneic, breathing in the 30s, kussmaul respirations  Abdominal:     General: There is no distension.     Palpations: Abdomen is soft.     Tenderness: There is no abdominal  tenderness. There is no guarding.  Genitourinary:    Penis: Normal.      Testes: Normal.  Musculoskeletal:        General: No deformity or signs of injury.     Cervical back: Normal range of motion.     Comments: Edema of the bilateral upper and lower extremities  Skin:    Coloration: Skin is not jaundiced or pale.  Neurological:     Mental Status: He is alert.     Comments: Patient's eyes are closed, opens to light touch, incomprehensible speech, does not follow commands, withdraws from pain  Psychiatric:     Comments: Unable to assess 2/2 critical illness     LABS (all labs ordered are listed, but only abnormal results are displayed)  Labs Reviewed  BLOOD GAS, VENOUS - Abnormal; Notable for the following components:      Result Value   pH, Ven <6.900 (*)    pCO2, Ven 26 (*)    Bicarbonate 4.4 (*)    Acid-base deficit 27.9 (*)    All other components within normal limits  LACTIC ACID, PLASMA - Abnormal; Notable for the following components:   Lactic Acid, Venous 6.8 (*)    All other components within normal limits  COMPREHENSIVE METABOLIC PANEL - Abnormal; Notable for the following components:   Sodium 133 (*)    CO2 <7 (*)    Glucose, Bld 772 (*)    BUN 25 (*)    Creatinine, Ser 2.10 (*)    Calcium 8.1 (*)    Total Protein 5.2 (*)    Albumin 2.5 (*)    Total Bilirubin 1.9 (*)    GFR, Estimated 34 (*)    All other components within normal limits  CBC WITH DIFFERENTIAL/PLATELET - Abnormal; Notable for the following components:   WBC 17.4 (*)    RBC 2.70 (*)    Hemoglobin 9.0 (*)    HCT 31.0 (*)    MCV 114.8 (*)  MCHC 29.0 (*)    Platelets 491 (*)    Neutro Abs 13.6 (*)    Abs Immature Granulocytes 0.22 (*)    All other components within normal limits  PROTIME-INR - Abnormal; Notable for the following components:   Prothrombin Time 15.8 (*)    INR 1.3 (*)    All other components within normal limits  URINALYSIS, COMPLETE (UACMP) WITH MICROSCOPIC - Abnormal;  Notable for the following components:   Color, Urine YELLOW (*)    APPearance CLOUDY (*)    Glucose, UA >=500 (*)    Hgb urine dipstick SMALL (*)    Ketones, ur 5 (*)    Protein, ur 30 (*)    Bacteria, UA RARE (*)    All other components within normal limits  BETA-HYDROXYBUTYRIC ACID - Abnormal; Notable for the following components:   Beta-Hydroxybutyric Acid >8.00 (*)    All other components within normal limits  TROPONIN I (HIGH SENSITIVITY) - Abnormal; Notable for the following components:   Troponin I (High Sensitivity) 141 (*)    All other components within normal limits  TROPONIN I (HIGH SENSITIVITY) - Abnormal; Notable for the following components:   Troponin I (High Sensitivity) 157 (*)    All other components within normal limits  RESP PANEL BY RT-PCR (FLU A&B, COVID) ARPGX2  CULTURE, BLOOD (ROUTINE X 2)  CULTURE, BLOOD (ROUTINE X 2)  URINE CULTURE  APTT  LACTIC ACID, PLASMA  PROCALCITONIN  BASIC METABOLIC PANEL  BASIC METABOLIC PANEL  BASIC METABOLIC PANEL  BETA-HYDROXYBUTYRIC ACID  BETA-HYDROXYBUTYRIC ACID  STREP PNEUMONIAE URINARY ANTIGEN  LEGIONELLA PNEUMOPHILA SEROGP 1 UR AG  TYPE AND SCREEN   ____________________________________________  EKG  Tachycardia, no clear P waves, left axis deviation, poor R wave progression, prolonged QTc interval, no acute ischemic changes ____________________________________________  RADIOLOGY Almeta Monas, personally viewed and evaluated these images (plain radiographs) as part of my medical decision making, as well as reviewing the written report by the radiologist.  ED MD interpretation: I reviewed the x-ray of the chest which shows bilateral lower lobe opacities    ____________________________________________   PROCEDURES  Procedure(s) performed (including Critical Care):  .Critical Care Performed by: Rada Hay, MD Authorized by: Rada Hay, MD   Critical care provider statement:     Critical care time (minutes):  80   Critical care was necessary to treat or prevent imminent or life-threatening deterioration of the following conditions:  Circulatory failure, endocrine crisis, metabolic crisis, shock, sepsis and renal failure   Critical care was time spent personally by me on the following activities:  Discussions with consultants, evaluation of patient's response to treatment, examination of patient, ordering and performing treatments and interventions, ordering and review of laboratory studies, ordering and review of radiographic studies, pulse oximetry, re-evaluation of patient's condition, obtaining history from patient or surrogate and review of old charts   Care discussed with: admitting provider   .1-3 Lead EKG Interpretation Performed by: Rada Hay, MD Authorized by: Rada Hay, MD     Interpretation: abnormal     ECG rate assessment: tachycardic     Rhythm: other rhythm     Ectopy: none     Conduction: normal     ____________________________________________   INITIAL IMPRESSION / ASSESSMENT AND PLAN / ED COURSE     65 year old male with type 1 diabetes who presents with altered mental status.  On arrival to the ED he appears critically ill.  He is hypothermic to 91, tachycardic  tachypneic and hypotensive to 70s over 50s.  He appears mottled he is protecting his airway however significantly altered.  His blood sugar read high.  Rapid VBG shows a pH of 6.8.  IV access was obtained and he was given 2 L of LR upfront.  He received 500 cc from EMS so received over 30 cc/kg.  Started on Levophed due to very low maps.  When his pH resulted he was given an amp of bicarb and started on a bicarb drip.  Rest of his labs are notable for leukocytosis of 17, lactate of 6.8, AKI with creatinine 2.1 from baseline of 0.8, bicarb of 4, anion gap 29.  His urine does not reflect UTI.  Abdomen is benign without any skin findings to suggest infection.  Chest x-ray does  show bilateral infiltrates, similar to prior.  Read as radiology as worsening compared to prior.  He was given broad-spectrum antibiotics to cover sepsis including vancomycin, cefepime and Flagyl.  Received sepsis fluids bicarb drip and Levophed.  He was put on a Retail banker for severe hypothermia.  ICU was called.  We discussed placement of central line which they will do down here in the ED.  Patient maintaining his airway appropriately.  Patient's wife at bedside updated on his critical illness.  Clinical Course as of 01/23/2021 1559  Mon Feb 03, 2021  1415 Lactic Acid, Venous(!!): 6.8 [KM]  1424 Troponin I (High Sensitivity)(!!): 141 [KM]  1424 Creatinine(!): 2.10 [KM]  1452 Beta-Hydroxybutyric Acid(!): >8.00 [KM]    Clinical Course User Index [KM] Rada Hay, MD     ____________________________________________   FINAL CLINICAL IMPRESSION(S) / ED DIAGNOSES  Final diagnoses:  Septic shock ([place])  Diabetic ketoacidosis with coma associated with type 1 diabetes mellitus ([place])  AKI (acute kidney injury) [facility])     ED Discharge Orders     None        Note:  This document was prepared using Dragon voice recognition software and may include unintentional dictation errors.    Rada Hay, MD 01/19/2021 [phone]

## 2021-02-03 NOTE — Progress Notes (Signed)
Pt arrived to ICU via nurse and tech.  Pt placed on monitor and in ST.  Awake and alert upon arrival.  In no resp distress.  [place] at 2L with o2 sats 93%.  Skin assessed with no breakdown noted.  POC continued.

## 2021-02-03 NOTE — Consult Note (Addendum)
PHARMACY CONSULT NOTE - FOLLOW UP  Pharmacy Consult for Electrolyte Monitoring and Replacement   Recent Labs: Potassium (mmol/L)  Date Value  02/01/2021 4.1   Magnesium (mg/dL)  Date Value  01/17/2021 1.5 (L)   Calcium (mg/dL)  Date Value  02/01/2021 7.7 (L)   Albumin (g/dL)  Date Value  01/25/2021 2.5 (L)   Phosphorus (mg/dL)  Date Value  02/01/2021 6.4 (H)   Sodium (mmol/L)  Date Value  01/11/2021 135     Assessment: 65 y.o. male with a past medical history of type 1 diabetes, COPD, hypertension, and hyperlipidemia who presents with altered mental status.    Pt is currently on an insulin drip with bicarb drip, norepinephrine drip, and fluids.   Goal of Therapy:  Electrolytes wnl's  Plan:  Will give Mg 2g IV bolus x 1 Will trend BMP on insulin drip Will follow mg/phos with am labs   Lu Duffel ,PharmD Clinical Pharmacist 01/15/2021 5:50 PM

## 2021-02-03 NOTE — Progress Notes (Signed)
Timeout completed with Darel Hong NP at 1736 for central line placement.  Correct patient and correct site needed for vasopressors and lab draws.

## 2021-02-03 NOTE — Consult Note (Signed)
Pharmacy Antibiotic Note  John Reilly is a 65 y.o. male admitted on 02/05/2021 with sepsis - worsening mental status, hypoxyia and hyperglycemia.  Pt recently treated for PNA as inpatient - discharged 9/20.    Pharmacy has been consulted for Vancomcyin/Cefepime dosing.  Plan: Pt received Vancomcyin 1500mg  loading dose in the ED - will hold further dosing given current renal function ([place] 2.1, baseline ~ 1.0)  Will start Cefepime 2g q12h and follow renal function daily for now  Weight: 65.1 kg (143 lb 8 oz)  Temp (24hrs), Avg:94.2 F (34.6 C), Min:91.4 F (33 C), Max:95.4 F (35.2 C)  Recent Labs  Lab 01/28/21 0402 01/29/2021 1338  WBC  --  17.4*  CREATININE 0.85 2.10*  LATICACIDVEN  --  6.8*    Estimated Creatinine Clearance: 31.9 mL/min (A) (by C-G formula based on SCr of 2.1 mg/dL (H)).    Allergies  Allergen Reactions   Penicillins Rash and Other (See Comments)    Has patient had a PCN reaction causing immediate rash, facial/tongue/throat swelling, SOB or lightheadedness with hypotension: Yes Has patient had a PCN reaction causing severe rash involving mucus membranes or skin necrosis: No Has patient had a PCN reaction that required hospitalization: No Has patient had a PCN reaction occurring within the last 10 years: No If all of the above answers are "NO", then may proceed with Cephalosporin use. Other reaction(s): RASH Has patient had a PCN reaction causing immediate rash, facial/tongue/throat swelling, SOB or lightheadedness with hypotension: Yes Has patient had a PCN reaction causing severe rash involving mucus membranes or skin necrosis: No Has patient had a PCN reaction that required hospitalization: No Has patient had a PCN reaction occurring within the last 10 years: No If all of the above answers are "NO", then may proceed with Cephalosporin use. Reaction:  Unknown; childhood reaction  Has patient had a PCN reaction causing immediate rash, facial/tongue/throat  swelling, SOB or lightheadedness with hypotension: Yes Has patient had a PCN reaction causing severe rash involving mucus membranes or skin necrosis: No Has patient had a PCN reaction that required hospitalization: No Has patient had a PCN reaction occurring within the last 10 years: No If all of the above answers are "NO", then may proceed with Cephalosporin use.   Sulfa Antibiotics Rash    Katherina Right Syndrome (SJS)     Antimicrobials this admission: Cefepime 9/26 >> Vancomcyin 9/26 >>  Dose adjustments this admission: None  Microbiology results: 9/26 Bcx pending 9/26 Ucx pending  Thank you for allowing pharmacy to be a part of this patient's care.  Lu Duffel, PharmD, BCPS Clinical Pharmacist 01/29/2021 4:02 PM

## 2021-02-03 NOTE — ED Notes (Signed)
Dr Starleen Blue to place a CVC.

## 2021-02-03 NOTE — ED Notes (Signed)
CRITICAL VALUE STICKER  CRITICAL VALUE:lactic acid:6.8  RECEIVER (on-site recipient of call):I.Saxon Barich  DATE & TIME NOTIFIED: 09/26 1404  MESSENGER (representative from lab):  MD NOTIFIED: [place]:  RESPONSE:

## 2021-02-03 NOTE — ED Notes (Signed)
No change to insulin drip per endo tool

## 2021-02-03 NOTE — ED Notes (Signed)
Bair hugger applied.

## 2021-02-03 NOTE — ED Notes (Signed)
Pt received 542ml ns pta.

## 2021-02-04 DIAGNOSIS — A419 Sepsis, unspecified organism: Secondary | ICD-10-CM | POA: Diagnosis not present

## 2021-02-04 DIAGNOSIS — E1011 Type 1 diabetes mellitus with ketoacidosis with coma: Secondary | ICD-10-CM | POA: Diagnosis not present

## 2021-02-04 DIAGNOSIS — R6521 Severe sepsis with septic shock: Secondary | ICD-10-CM | POA: Diagnosis not present

## 2021-02-04 LAB — PHOSPHORUS: Phosphorus: 3 mg/dL (ref 2.5–4.6)

## 2021-02-04 LAB — COMPREHENSIVE METABOLIC PANEL
ALT: 8 U/L (ref 0–44)
AST: 30 U/L (ref 15–41)
Albumin: 2.1 g/dL — ABNORMAL LOW (ref 3.5–5.0)
Alkaline Phosphatase: 79 U/L (ref 38–126)
Anion gap: 13 (ref 5–15)
BUN: 25 mg/dL — ABNORMAL HIGH (ref 8–23)
CO2: 22 mmol/L (ref 22–32)
Calcium: 7.7 mg/dL — ABNORMAL LOW (ref 8.9–10.3)
Chloride: 102 mmol/L (ref 98–111)
Creatinine, Ser: 1.83 mg/dL — ABNORMAL HIGH (ref 0.61–1.24)
GFR, Estimated: 40 mL/min — ABNORMAL LOW (ref >60)
Glucose, Bld: 198 mg/dL — ABNORMAL HIGH (ref 70–99)
Potassium: 3.1 mmol/L — ABNORMAL LOW (ref 3.5–5.1)
Sodium: 137 mmol/L (ref 135–145)
Total Bilirubin: 0.7 mg/dL (ref 0.3–1.2)
Total Protein: 4.6 g/dL — ABNORMAL LOW (ref 6.5–8.1)

## 2021-02-04 LAB — C DIFFICILE QUICK SCREEN W PCR REFLEX
C Diff antigen: POSITIVE — AB
C Diff toxin: NEGATIVE

## 2021-02-04 LAB — GLUCOSE, CAPILLARY
Glucose-Capillary: 180 mg/dL — ABNORMAL HIGH (ref 70–99)
Glucose-Capillary: 221 mg/dL — ABNORMAL HIGH (ref 70–99)
Glucose-Capillary: 271 mg/dL — ABNORMAL HIGH (ref 70–99)
Glucose-Capillary: 226 mg/dL — ABNORMAL HIGH (ref 70–99)
Glucose-Capillary: 213 mg/dL — ABNORMAL HIGH (ref 70–99)
Glucose-Capillary: 133 mg/dL — ABNORMAL HIGH (ref 70–99)
Glucose-Capillary: 161 mg/dL — ABNORMAL HIGH (ref 70–99)
Glucose-Capillary: 137 mg/dL — ABNORMAL HIGH (ref 70–99)
Glucose-Capillary: 123 mg/dL — ABNORMAL HIGH (ref 70–99)
Glucose-Capillary: 121 mg/dL — ABNORMAL HIGH (ref 70–99)
Glucose-Capillary: 91 mg/dL (ref 70–99)
Glucose-Capillary: 65 mg/dL — ABNORMAL LOW (ref 70–99)
Glucose-Capillary: 75 mg/dL (ref 70–99)
Glucose-Capillary: 110 mg/dL — ABNORMAL HIGH (ref 70–99)

## 2021-02-04 LAB — BASIC METABOLIC PANEL
Anion gap: 13 (ref 5–15)
BUN: 23 mg/dL (ref 8–23)
CO2: 25 mmol/L (ref 22–32)
Calcium: 7.8 mg/dL — ABNORMAL LOW (ref 8.9–10.3)
Chloride: 100 mmol/L (ref 98–111)
Creatinine, Ser: 1.82 mg/dL — ABNORMAL HIGH (ref 0.61–1.24)
GFR, Estimated: 40 mL/min — ABNORMAL LOW (ref >60)
Glucose, Bld: 110 mg/dL — ABNORMAL HIGH (ref 70–99)
Potassium: 3.4 mmol/L — ABNORMAL LOW (ref 3.5–5.1)
Sodium: 138 mmol/L (ref 135–145)

## 2021-02-04 LAB — CBC
HCT: 24 % — ABNORMAL LOW (ref 39.0–52.0)
Hemoglobin: 8.1 g/dL — ABNORMAL LOW (ref 13.0–17.0)
MCH: 32.8 pg (ref 26.0–34.0)
MCHC: 33.8 g/dL (ref 30.0–36.0)
MCV: 97.2 fL (ref 80.0–100.0)
Platelets: 370 10*3/uL (ref 150–400)
RBC: 2.47 MIL/uL — ABNORMAL LOW (ref 4.22–5.81)
RDW: 13.4 % (ref 11.5–15.5)
WBC: 9.9 10*3/uL (ref 4.0–10.5)
nRBC: 0 % (ref 0.0–0.2)

## 2021-02-04 LAB — D-DIMER, QUANTITATIVE: D-Dimer, Quant: 1.37 ug/mL-FEU — ABNORMAL HIGH (ref 0.00–0.50)

## 2021-02-04 LAB — LACTIC ACID, PLASMA
Lactic Acid, Venous: 4.2 mmol/L (ref 0.5–1.9)
Lactic Acid, Venous: 1.8 mmol/L (ref 0.5–1.9)

## 2021-02-04 LAB — URINE CULTURE: Culture: NO GROWTH

## 2021-02-04 LAB — MAGNESIUM: Magnesium: 1.8 mg/dL (ref 1.7–2.4)

## 2021-02-04 LAB — C-REACTIVE PROTEIN: CRP: 3.6 mg/dL — ABNORMAL HIGH (ref <1.0)

## 2021-02-04 LAB — CLOSTRIDIUM DIFFICILE BY PCR, REFLEXED: Toxigenic C. Difficile by PCR: POSITIVE — AB

## 2021-02-04 LAB — PROCALCITONIN: Procalcitonin: 2.72 ng/mL

## 2021-02-04 LAB — BETA-HYDROXYBUTYRIC ACID
Beta-Hydroxybutyric Acid: 0.62 mmol/L — ABNORMAL HIGH (ref 0.05–0.27)
Beta-Hydroxybutyric Acid: 0.09 mmol/L (ref 0.05–0.27)

## 2021-02-04 LAB — POTASSIUM: Potassium: 3.4 mmol/L — ABNORMAL LOW (ref 3.5–5.1)

## 2021-02-04 LAB — FERRITIN: Ferritin: 1149 ng/mL — ABNORMAL HIGH (ref 24–336)

## 2021-02-04 MED ORDER — INSULIN ASPART 100 UNIT/ML IJ SOLN
0.0000 [IU] | INTRAMUSCULAR | Status: DC
  Administered 2021-02-05: 1 [IU] via SUBCUTANEOUS
  Administered 2021-02-05: 3 [IU] via SUBCUTANEOUS
  Administered 2021-02-05: 2 [IU] via SUBCUTANEOUS
  Filled 2021-02-04 (×3): qty 1

## 2021-02-04 MED ORDER — INSULIN GLARGINE-YFGN 100 UNIT/ML ~~LOC~~ SOLN
6.0000 [IU] | Freq: Every day | SUBCUTANEOUS | Status: DC
  Administered 2021-02-04 – 2021-02-05 (×2): 6 [IU] via SUBCUTANEOUS
  Filled 2021-02-04 (×4): qty 0.06

## 2021-02-04 MED ORDER — LACTATED RINGERS IV BOLUS
1000.0000 mL | Freq: Once | INTRAVENOUS | Status: AC
  Administered 2021-02-04: 1000 mL via INTRAVENOUS

## 2021-02-04 MED ORDER — MAGNESIUM SULFATE 2 GM/50ML IV SOLN
2.0000 g | Freq: Once | INTRAVENOUS | Status: AC
  Administered 2021-02-04: 2 g via INTRAVENOUS
  Filled 2021-02-04: qty 50

## 2021-02-04 MED ORDER — POTASSIUM CHLORIDE CRYS ER 20 MEQ PO TBCR
20.0000 meq | EXTENDED_RELEASE_TABLET | ORAL | Status: AC
  Administered 2021-02-04 (×2): 20 meq via ORAL
  Filled 2021-02-04 (×2): qty 1

## 2021-02-04 MED ORDER — VANCOMYCIN HCL 750 MG/150ML IV SOLN
750.0000 mg | INTRAVENOUS | Status: DC
  Administered 2021-02-04: 750 mg via INTRAVENOUS
  Filled 2021-02-04 (×3): qty 150

## 2021-02-04 NOTE — Progress Notes (Signed)
Vital signs reviewed, ICU needs resolved  Off pressors, off Oxygen Alert and awake Now SD status  Transfer to [facility]    Corrin Parker, M.D.  [facility]  Medical Director [place] Director [facility] Cardio-Pulmonary Department

## 2021-02-04 NOTE — Progress Notes (Addendum)
NAME:  John Reilly, MRN:  [id number], DOB:  1955-07-07, LOS: 1 ADMISSION DATE:  01/11/2021 Brief Pt Description / Synopsis:  65 year old male admitted with severe DKA and septic shock due to to suspected healthcare associated pneumonia vs. Aspiration pneumonia requiring Levophed and bicarb drips. COPD exacerbation   He met criteria for DKA and sepsis.  Therefore he received 2 L of LR boluses (received 500 cc with EMS), was placed on insulin drip, along with broad-spectrum antibiotics including cefepime, Flagyl, vancomycin.  Given his severe metabolic acidosis he was given 1 amp of bicarb and started on bicarb drip.  Despite IV fluid resuscitation he remained hypotensive requiring Levophed drip.   PCCM is asked to admit the patient to ICU for further work-up and treatment of severe DKA and septic shock due to suspected healthcare associated pneumonia. Significant Hospital Events: Including procedures, antibiotic start and stop dates in addition to other pertinent events   9/26 admitted to ICU for severe acidosis 9/27 severe DKA, MS better      Micro Data:  COVID +  Antimicrobials:   Antibiotics Given (last 72 hours)     Date/Time Action Medication Dose Rate   01/13/2021 1437 New Bag/Given   vancomycin (VANCOREADY) IVPB 1500 mg/300 mL 1,500 mg 150 mL/hr   02/02/2021 1810 New Bag/Given   ceFEPIme (MAXIPIME) 2 g in sodium chloride 0.9 % 100 mL IVPB 2 g 200 mL/hr   01/09/2021 1838 New Bag/Given   metroNIDAZOLE (FLAGYL) IVPB 500 mg 500 mg 100 mL/hr   01/26/2021 1955 New Bag/Given   remdesivir 200 mg in sodium chloride 0.9% 250 mL IVPB 200 mg 580 mL/hr            Interim History / Subjective:  Remains on insulin MS little better today +COVID infection and pneumonia  BMP Latest Ref Rng & Units 02/04/2021 01/24/2021 01/25/2021  Glucose 70 - 99 mg/dL 198(H) 359(H) 697(HH)  BUN 8 - 23 mg/dL 25(H) 25(H) 24(H)  Creatinine 0.61 - 1.24 mg/dL 1.83(H) 1.92(H) 2.10(H)  Sodium 135 - 145  mmol/L 137 136 135  Potassium 3.5 - 5.1 mmol/L 3.1(L) 2.9(L) 4.1  Chloride 98 - 111 mmol/L 102 100 101  CO2 22 - 32 mmol/L 22 17(L) 7(L)  Calcium 8.9 - 10.3 mg/dL 7.7(L) 7.6(L) 7.7(L)        Objective   Blood pressure 119/71, pulse 91, temperature 99 F (37.2 C), resp. rate 13, height '5\' 10"'  (1.778 m), weight 61.9 kg, SpO2 100 %.        Intake/Output Summary (Last 24 hours) at 02/04/2021 1607 Last data filed at 02/04/2021 0600 Gross per 24 hour  Intake 4345.69 ml  Output 190 ml  Net 4155.69 ml   Filed Weights   02/02/2021 1438 02/02/2021 1715 02/04/21 0545  Weight: 65.1 kg 59.8 kg 61.9 kg      REVIEW OF SYSTEMS +lethargy  ALL OTHER ROS ARE NEGATIVE  Physical Examination:   General Appearance: critically ill appearing EYES PERRLA, EOM intact.   NECK Supple, No JVD Pulmonary: normal breath sounds, No wheezing.  CardiovascularNormal S1,S2.  No m/r/g.   Abdomen: Benign, Soft, non-tender. Skin:   warm, no rashes, no ecchymosis  Extremities: normal, no cyanosis, clubbing. Neuro:lethargic but arousable PSYCHIATRIC: Mood, affect within normal limits.     Labs/imaging that I havepersonally reviewed  (right click and "Reselect all SmartList Selections" daily)      ASSESSMENT AND PLAN SYNOPSIS  Severe ACUTE Hypoxic and Hypercapnic Respiratory Failure due to severe acidosis from DKA  with COVID 19 infection and severe sepsis Wean fio2 as tolerated  COVID 19 infection Remdesivir Steroids   SEVERE COPD EXACERBATION -continue IV steroids as prescribed -continue NEB THERAPY as prescribed -morphine as needed -wean fio2 as needed and tolerated  CARDIAC ICU monitoring   ACUTE KIDNEY INJURY/Renal Failure -continue Foley Catheter-assess need -Avoid nephrotoxic agents -Follow urine output, BMP -Ensure adequate renal perfusion, optimize oxygenation -Renal dose medications   Intake/Output Summary (Last 24 hours) at 02/04/2021 6160 Last data filed at 02/04/2021  0600 Gross per 24 hour  Intake 4345.69 ml  Output 190 ml  Net 4155.69 ml     NEUROLOGY MS improving  SEPTIC SHOCK SOURCE-COVID 19 infection and pneumonia -use vasopressors to keep MAP>65 as needed   INFECTIOUS DISEASE -continue antibiotics as prescribed -follow up cultures  ENDO - ICU hypoglycemic\Hyperglycemia protocol -check FSBS per protocol   GI GI PROPHYLAXIS as indicated  NUTRITIONAL STATUS DIET--> as tolerated Constipation protocol as indicated   ELECTROLYTES -follow labs as needed -replace as needed -pharmacy consultation and following     Best practice (right click and "Reselect all SmartList Selections" daily)  Diet:  as tolerated DVT prophylaxis: Subcutaneous Heparin GI prophylaxis: H2B Glucose control:  Insulin gtt Central venous access:  Yes, and it is still needed Foley:  Yes, and it is still needed Mobility:  bed rest  Code Status:  FULL CODE Disposition: ICU  Labs   CBC: Recent Labs  Lab 01/09/2021 1338 02/04/21 0130  WBC 17.4* 9.9  NEUTROABS 13.6*  --   HGB 9.0* 8.1*  HCT 31.0* 24.0*  MCV 114.8* 97.2  PLT 491* 737    Basic Metabolic Panel: Recent Labs  Lab 02/04/2021 1338 01/27/2021 1442 01/29/2021 2032 02/04/21 0130  NA 133* 135 136 137  K 5.1 4.1 2.9* 3.1*  CL 100 101 100 102  CO2 <7* 7* 17* 22  GLUCOSE 772* 697* 359* 198*  BUN 25* 24* 25* 25*  CREATININE 2.10* 2.10* 1.92* 1.83*  CALCIUM 8.1* 7.7* 7.6* 7.7*  MG  --  1.5*  --  1.8  PHOS  --  6.4*  --  3.0   GFR: Estimated Creatinine Clearance: 34.8 mL/min (A) (by C-G formula based on SCr of 1.83 mg/dL (H)). Recent Labs  Lab 01/17/2021 1338 02/01/2021 1442 01/18/2021 1633 02/04/21 0130 02/04/21 0515  PROCALCITON  --  0.57  --  2.72  --   WBC 17.4*  --   --  9.9  --   LATICACIDVEN 6.8*  --  4.7*  --  4.2*    Liver Function Tests: Recent Labs  Lab 01/10/2021 1338 02/04/21 0130  AST 29 30  ALT <5 8  ALKPHOS 105 79  BILITOT 1.9* 0.7  PROT 5.2* 4.6*  ALBUMIN 2.5*  2.1*   No results for input(s): LIPASE, AMYLASE in the last 168 hours. No results for input(s): AMMONIA in the last 168 hours.  ABG    Component Value Date/Time   PHART 7.56 (H) 10/14/2017 0739   PCO2ART 25 (L) 10/14/2017 0739   PO2ART 111 (H) 10/14/2017 0739   HCO3 4.4 (L) 01/16/2021 1342   ACIDBASEDEF 27.9 (H) 01/31/2021 1342   O2SAT 40.6 01/30/2021 1342     Coagulation Profile: Recent Labs  Lab 01/22/2021 1338  INR 1.3*    Cardiac Enzymes: No results for input(s): CKTOTAL, CKMB, CKMBINDEX, TROPONINI in the last 168 hours.  HbA1C: Hgb A1c MFr Bld  Date/Time Value Ref Range Status  01/23/2021 06:49 AM 5.2 4.8 - 5.6 % Final  Comment:    (NOTE) Pre diabetes:          5.7%-6.4%  Diabetes:              >6.4%  Glycemic control for   <7.0% adults with diabetes   03/21/2020 10:20 PM 6.6 (H) 4.8 - 5.6 % Final    Comment:    (NOTE) Pre diabetes:          5.7%-6.4%  Diabetes:              >6.4%  Glycemic control for   <7.0% adults with diabetes     CBG: Recent Labs  Lab 02/04/21 0215 02/04/21 0321 02/04/21 0415 02/04/21 0518 02/04/21 0632  GLUCAP 271* 226* 213* 161* 133*    Allergies Allergies  Allergen Reactions   Penicillins Rash and Other (See Comments)    Has patient had a PCN reaction causing immediate rash, facial/tongue/throat swelling, SOB or lightheadedness with hypotension: Yes Has patient had a PCN reaction causing severe rash involving mucus membranes or skin necrosis: No Has patient had a PCN reaction that required hospitalization: No Has patient had a PCN reaction occurring within the last 10 years: No If all of the above answers are "NO", then may proceed with Cephalosporin use. Other reaction(s): RASH Has patient had a PCN reaction causing immediate rash, facial/tongue/throat swelling, SOB or lightheadedness with hypotension: Yes Has patient had a PCN reaction causing severe rash involving mucus membranes or skin necrosis: No Has  patient had a PCN reaction that required hospitalization: No Has patient had a PCN reaction occurring within the last 10 years: No If all of the above answers are "NO", then may proceed with Cephalosporin use. Reaction:  Unknown; childhood reaction  Has patient had a PCN reaction causing immediate rash, facial/tongue/throat swelling, SOB or lightheadedness with hypotension: Yes Has patient had a PCN reaction causing severe rash involving mucus membranes or skin necrosis: No Has patient had a PCN reaction that required hospitalization: No Has patient had a PCN reaction occurring within the last 10 years: No If all of the above answers are "NO", then may proceed with Cephalosporin use.   Sulfa Antibiotics Rash    Katherina Right Syndrome (SJS)        DVT/GI PRX  assessed I Assessed the need for Labs I Assessed the need for Foley I Assessed the need for Central Venous Line Family Discussion when available I Assessed the need for Mobilization I made an Assessment of medications to be adjusted accordingly Safety Risk assessment completed  CASE DISCUSSED IN MULTIDISCIPLINARY ROUNDS WITH ICU TEAM     Critical Care Time devoted to patient care services described in this note is 35 minutes.   Corrin Parker, M.D.  [facility]  Medical Director [place] Director [facility] Cardio-Pulmonary Department

## 2021-02-04 NOTE — Plan of Care (Signed)
Care plan added, pt unable to understand teaching, no family available at this time.

## 2021-02-04 NOTE — TOC Initial Note (Addendum)
Transition of Care [facility]) - Initial/Assessment Note    Patient Details  Name: John Reilly MRN: [id number] Date of Birth: 12-Jun-1955  Transition of Care [facility]) CM/SW Contact:    John Reilly, [place] Phone Number: 02/04/2021, 5:24 PM  Clinical Narrative:                  Patient presents to [facility] due to concerns for hypoglycemia.  Patient is unable to perform ADLs without assistance.  CSW spoke with patient's John, Reilly (Spouse) [phone] [facility]) who stated she assist the patient at home but his care needs have increased.  Ms. Victorio stated patient was at [facility] for rehab for about five days.  Ms. Hulgan stated she supported the patient returning to SNF for rehab if that is what PT/OT recommended. CSW explained if PT/OT recommended home health, Medicare would not cover SNF rehab.  Ms. Bogard verbalized understanding.  Ms. Yim stated she would like to be updated once PT/OT  evaluated patient. TOC following.  Expected Discharge Plan: Skilled Nursing Facility Barriers to Discharge: Continued Medical Work up, SNF Pending bed offer   Patient Goals and CMS Choice        Expected Discharge Plan and Services Expected Discharge Plan: [place] In-house Referral: Clinical Social Work   Post Acute Care Choice: [place] ([facility])                                        Prior Living Arrangements/Services     Patient language and need for interpreter reviewed:: Yes Do you feel safe going back to the place where you live?: Yes      Need for Family Participation in Patient Care: Yes (Comment) Care giver support system in place?: Yes (comment)   Criminal Activity/Legal Involvement Pertinent to Current Situation/Hospitalization: No - Comment as needed  Activities of Daily Living      Permission Sought/Granted Permission sought to share information with : Facility Sport and exercise psychologist    Share Information with NAME:  John Reilly (Spouse)   [phone] (Mobile)  Permission granted to share info w AGENCY: [facility]        Emotional Assessment Appearance:: Appears stated age Attitude/Demeanor/Rapport: Unable to Assess Affect (typically observed): Unable to Assess     Psych Involvement: No (comment)  Admission diagnosis:  DKA (diabetic ketoacidosis) ([place]) [E11.10] AKI (acute kidney injury) ([place]) [N17.9] Septic shock ([place]) [A41.9, R65.21] Diabetic ketoacidosis with coma associated with type 1 diabetes mellitus ([place]) [E10.11] Patient Active Problem List   Diagnosis Date Noted   DKA (diabetic ketoacidosis) ([place]) 01/09/2021   Protein-calorie malnutrition, severe 01/22/2021   Hypoglycemia 01/20/2021   Type I diabetes mellitus ([place]) 17/40/8144   Acute metabolic encephalopathy 81/85/6314   COPD (chronic obstructive pulmonary disease) (H[facility])    Hypertension    Orthostatic hypotension    Parkinson disease ([place])    Hypothermia    Aspiration pneumonia ([place])    Alcohol abuse    Sepsis ([place])    AMS (altered mental status) 03/21/2020   Hypoglycemia due to insulin 03/21/2020   Type 2 diabetes mellitus with vascular disease ([place]) 03/11/2020   Pain due to onychomycosis of toenails of both feet 09/07/2019   Dermatitis 09/07/2019   S/p left hip fracture 08/10/2018   Altered mental status    Closed fracture of left hip ([place])    Fall    DKA (diabetic  ketoacidoses) 10/13/2017   Malnutrition of moderate degree 10/04/2017   Humeral fracture 10/03/2017   Ulcer of toe ([place]) 04/27/2015   Hyponatremia 03/15/2015   PCP:  Adin Hector, MD Pharmacy:   [facility], [place] [address] [address] Phone: [phone] Fax: [phone]  [facility] [place], [place] [address] Phone: [phone] Fax: [phone]  [facility] [address], [place] - [place] [address] Phone: [phone] Fax: [phone]  [facility] [place], [place] [address] [place] [address] Phone: [phone] Fax: [phone]     Social Determinants of Health (SDOH) Interventions    Readmission Risk Interventions No flowsheet data found.

## 2021-02-04 NOTE — Consult Note (Signed)
PHARMACY CONSULT NOTE - FOLLOW UP  Pharmacy Consult for Electrolyte Monitoring and Replacement   Recent Labs: Potassium (mmol/L)  Date Value  02/04/2021 3.4 (L)   Magnesium (mg/dL)  Date Value  02/04/2021 1.8   Calcium (mg/dL)  Date Value  02/04/2021 7.8 (L)   Albumin (g/dL)  Date Value  02/04/2021 2.1 (L)   Phosphorus (mg/dL)  Date Value  02/04/2021 3.0   Sodium (mmol/L)  Date Value  02/04/2021 138     Assessment: 65 y.o. male with a past medical history of type 1 diabetes, COPD, hypertension, and hyperlipidemia who presents with altered mental status.    Pt is currently on an insulin and bicarb drips, planning to transition off insulin drip.   Goal of Therapy:  Electrolytes WNL  Plan:  --Transitioning off insulin drip --Magnesium and potassium replaced --Continue to follow along  Tawnya Crook, PharmD, BCPS Clinical Pharmacist 02/04/2021 11:15 AM

## 2021-02-04 NOTE — Progress Notes (Addendum)
Inpatient Diabetes Program Recommendations  AACE/ADA: New Consensus Statement on Inpatient Glycemic Control (2015)  Target Ranges:  Prepandial:   less than 140 mg/dL      [facility] postprandial:   less than 180 mg/dL (1-2 hours)      Critically ill patients:  140 - 180 mg/dL  Results for HARLIS, CHAMPOUX (MRN [id number]) as of 02/04/2021 06:55  Ref. Range 02/04/2021 02:15 02/04/2021 03:21 02/04/2021 04:15 02/04/2021 05:18 02/04/2021 06:32  Glucose-Capillary Latest Ref Range: 70 - 99 mg/dL 271 (H) 226 (H) 213 (H) 161 (H) 133 (H)    Admit with Sepsis/ Pneumonia/ DKA/ COVID+   History: T1DM   SNF Meds: Lantus 6 units QHS        Humalog 0-9 units TID with meals per SSI        Humalog 3 units TID with meals for carbohydrate coverage   Current Orders: IV Insulin Drip     MD- Note Beta-Hydroxybutyric Acid levels down to 0.62 at 0130am today. BMET from [place] shows the following:  Glucose 198 Anion Gap 13 CO2 level 22  When pt allowed to transition to SQ Insulin, please consider: 1. Start Semglee 6 units Q24 hours (make sure 1st dose of Semglee on board 2 hours prior to d/c of the IV Insulin Drip)  2. Start Novolog Sensitive Correction Scale/ SSI (0-9 units) Q4 hours     --Will follow patient during hospitalization--  Wyn Quaker RN, MSN, CDE Diabetes Coordinator Inpatient Glycemic Control Team Team Pager: [phone] (8a-5p)

## 2021-02-05 ENCOUNTER — Other Ambulatory Visit: Payer: Self-pay

## 2021-02-05 DIAGNOSIS — N179 Acute kidney failure, unspecified: Secondary | ICD-10-CM | POA: Diagnosis not present

## 2021-02-05 DIAGNOSIS — U071 COVID-19: Secondary | ICD-10-CM | POA: Diagnosis not present

## 2021-02-05 DIAGNOSIS — R197 Diarrhea, unspecified: Secondary | ICD-10-CM

## 2021-02-05 DIAGNOSIS — A419 Sepsis, unspecified organism: Secondary | ICD-10-CM | POA: Diagnosis not present

## 2021-02-05 DIAGNOSIS — E1011 Type 1 diabetes mellitus with ketoacidosis with coma: Secondary | ICD-10-CM | POA: Diagnosis not present

## 2021-02-05 DIAGNOSIS — E860 Dehydration: Secondary | ICD-10-CM

## 2021-02-05 LAB — GLUCOSE, CAPILLARY
Glucose-Capillary: 170 mg/dL — ABNORMAL HIGH (ref 70–99)
Glucose-Capillary: 147 mg/dL — ABNORMAL HIGH (ref 70–99)
Glucose-Capillary: 211 mg/dL — ABNORMAL HIGH (ref 70–99)
Glucose-Capillary: 78 mg/dL (ref 70–99)

## 2021-02-05 LAB — CBC
HCT: 22 % — ABNORMAL LOW (ref 39.0–52.0)
Hemoglobin: 7.7 g/dL — ABNORMAL LOW (ref 13.0–17.0)
MCH: 34.5 pg — ABNORMAL HIGH (ref 26.0–34.0)
MCHC: 35 g/dL (ref 30.0–36.0)
MCV: 98.7 fL (ref 80.0–100.0)
Platelets: 252 10*3/uL (ref 150–400)
RBC: 2.23 MIL/uL — ABNORMAL LOW (ref 4.22–5.81)
RDW: 13.8 % (ref 11.5–15.5)
WBC: 6.9 10*3/uL (ref 4.0–10.5)
nRBC: 0 % (ref 0.0–0.2)

## 2021-02-05 LAB — COMPREHENSIVE METABOLIC PANEL
ALT: 9 U/L (ref 0–44)
AST: 25 U/L (ref 15–41)
Albumin: 1.8 g/dL — ABNORMAL LOW (ref 3.5–5.0)
Alkaline Phosphatase: 65 U/L (ref 38–126)
Anion gap: 11 (ref 5–15)
BUN: 20 mg/dL (ref 8–23)
CO2: 26 mmol/L (ref 22–32)
Calcium: 7.8 mg/dL — ABNORMAL LOW (ref 8.9–10.3)
Chloride: 100 mmol/L (ref 98–111)
Creatinine, Ser: 1.49 mg/dL — ABNORMAL HIGH (ref 0.61–1.24)
GFR, Estimated: 51 mL/min — ABNORMAL LOW (ref >60)
Glucose, Bld: 163 mg/dL — ABNORMAL HIGH (ref 70–99)
Potassium: 2.9 mmol/L — ABNORMAL LOW (ref 3.5–5.1)
Sodium: 137 mmol/L (ref 135–145)
Total Bilirubin: 0.6 mg/dL (ref 0.3–1.2)
Total Protein: 3.9 g/dL — ABNORMAL LOW (ref 6.5–8.1)

## 2021-02-05 LAB — MAGNESIUM: Magnesium: 2 mg/dL (ref 1.7–2.4)

## 2021-02-05 LAB — PROCALCITONIN: Procalcitonin: 2.1 ng/mL

## 2021-02-05 LAB — D-DIMER, QUANTITATIVE: D-Dimer, Quant: 1.32 ug/mL-FEU — ABNORMAL HIGH (ref 0.00–0.50)

## 2021-02-05 LAB — FERRITIN: Ferritin: 613 ng/mL — ABNORMAL HIGH (ref 24–336)

## 2021-02-05 LAB — LEGIONELLA PNEUMOPHILA SEROGP 1 UR AG: L. pneumophila Serogp 1 Ur Ag: NEGATIVE

## 2021-02-05 LAB — C-REACTIVE PROTEIN: CRP: 4.6 mg/dL — ABNORMAL HIGH (ref <1.0)

## 2021-02-05 LAB — PHOSPHORUS: Phosphorus: 2.5 mg/dL (ref 2.5–4.6)

## 2021-02-05 MED ORDER — ENSURE ENLIVE PO LIQD
237.0000 mL | Freq: Three times a day (TID) | ORAL | Status: DC
  Administered 2021-02-05 – 2021-02-10 (×8): 237 mL via ORAL

## 2021-02-05 MED ORDER — INSULIN ASPART 100 UNIT/ML IJ SOLN: 0.0000 [IU] | Freq: Three times a day (TID) | INTRAMUSCULAR | Status: DC

## 2021-02-05 MED ORDER — POTASSIUM CHLORIDE 10 MEQ/100ML IV SOLN
10.0000 meq | INTRAVENOUS | Status: DC
Start: 2021-02-05 — End: 2021-02-05

## 2021-02-05 MED ORDER — THIAMINE HCL 100 MG PO TABS
100.0000 mg | ORAL_TABLET | Freq: Every day | ORAL | Status: DC
  Administered 2021-02-06 – 2021-02-12 (×7): 100 mg via ORAL
  Filled 2021-02-05 (×7): qty 1

## 2021-02-05 MED ORDER — MIDODRINE HCL 5 MG PO TABS
5.0000 mg | ORAL_TABLET | Freq: Three times a day (TID) | ORAL | Status: DC
  Administered 2021-02-05 – 2021-02-10 (×15): 5 mg via ORAL
  Filled 2021-02-05 (×17): qty 1

## 2021-02-05 MED ORDER — RIVASTIGMINE TARTRATE 1.5 MG PO CAPS
1.5000 mg | ORAL_CAPSULE | Freq: Two times a day (BID) | ORAL | Status: DC
  Administered 2021-02-05 – 2021-02-12 (×15): 1.5 mg via ORAL
  Filled 2021-02-05 (×17): qty 1

## 2021-02-05 MED ORDER — PANTOPRAZOLE SODIUM 40 MG PO TBEC
40.0000 mg | DELAYED_RELEASE_TABLET | Freq: Every day | ORAL | Status: DC
  Administered 2021-02-05 – 2021-02-12 (×8): 40 mg via ORAL
  Filled 2021-02-05 (×8): qty 1

## 2021-02-05 MED ORDER — POTASSIUM CHLORIDE IN NACL 40-0.9 MEQ/L-% IV SOLN
INTRAVENOUS | Status: DC
  Filled 2021-02-05 (×7): qty 1000

## 2021-02-05 NOTE — Progress Notes (Signed)
PROGRESS NOTE    John Reilly   YKD:[id number]  DOB: Jul 18, 1955  DOA: 02/04/2021 PCP: Adin Hector, MD   Brief Narrative:  John Reilly is a 65 year old male with type 1 diabetes, COPD, hypertension, alcohol abuse, Parkinson's disease, hyperlipidemia who presented from a skilled nursing facility to the ED on 9/26 with confusion.    In the ED, noted to be in DKA and septic shock secondary to suspected pneumonia.  Admitted to the ICU, started on broad-spectrum antibiotics, Levophed and insulin infusions. His condition improved to be transferred out of the ICU and to the triad hospitalist service today.  Patient was recently hospitalized from 9/12 through 9/20 and he was found hypoglycemic and unresponsive by his wife.  In the hospital he was treated for sepsis secondary to aspiration pneumonia.  He was discharged to skilled nursing facility.  Subjective: He has a mild cough today.  He has no appetite.    Assessment & Plan:    Principal problem: Septic shock secondary to COVID-19 related pneumonia and possibly aspiration pneumonia -Chest x-ray on 9/26 revealed worsening opacity in the left base and persistent hazy opacities in the right lower lobe - Being treated with remdesivir, vancomycin and cefepime-blood cultures have remained negative-I will DC vancomycin -Of note, C. difficile PCR is positive and antigen is positive but toxin is negative-he continues to have watery stools but these may be secondary to the COVID-19 infection and therefore I will hold off on treating for C. difficile colitis  Active Problems:  Diabetes mellitus type 1 with DKA -Resolved-continue Semglee and NovoLog sliding scale -Hemoglobin A1C 5/2 -Home medication includes Lantus and Humalog-  AKI, anion gap metabolic acidosis, hyponatremia - Baseline creatinine is about 0.8 2.9 - Admitted with creatinine of 2.10 - Steadily improving-continue IV fluids today  Acute metabolic encephalopathy -  Secondary to sepsis - Improving  Diarrhea - Related to COVID-19 infection but also may be due to Creon not being resumed - Continue IV fluids to prevent dehydration  Orthostatic hypotension - ?  If related to Parkinson's disease - Patient is on Florinef and midodrine as which outpatient are being continued  COPD exacerbation? -Per critical care notes, they felt the patient had a COPD exacerbation on admission - No wheezing noted at this time-  Hypokalemia - Likely secondary to diarrhea - Continue to replace aggressively - Magnesium 2.0  Normocytic anemia - Continue to follow  Pancreatic insufficiency - Resume Creon  Parkinson's disease - Continue Sinemet  Alzheimer's dementia - Continue Exelon  Severe protein calorie malnutrition - Continue dietary supplements    Time spent in minutes: 35 DVT prophylaxis: heparin injection 5,000 Units Start: 01/27/2021 1600 SCDs Start: 02/02/2021 1549 Code Status: Full code Family Communication:  Level of Care: Level of care: Med-Surg Disposition Plan:  Status is: Inpatient  Remains inpatient appropriate because:IV treatments appropriate due to intensity of illness or inability to take PO  Dispo: The patient is from: SNF              Anticipated d/c is to: SNF              Patient currently is not medically stable to d/c.   Difficult to place patient No      Consultants:  critical care service Procedures:   Antimicrobials:  Anti-infectives (From admission, onward)    Start     Dose/Rate Route Frequency Ordered Stop   02/04/21 1600  vancomycin (VANCOREADY) IVPB 750 mg/150 mL  750 mg 150 mL/hr over 60 Minutes Intravenous Every 24 hours 02/04/21 1112     02/04/21 1000  remdesivir 100 mg in sodium chloride 0.9 % 100 mL IVPB       See Hyperspace for full Linked Orders Report.   100 mg 200 mL/hr over 30 Minutes Intravenous Daily 01/18/2021 1800 02/08/21 0959   02/04/21 0200  ceFEPIme (MAXIPIME) 2 g in sodium chloride  0.9 % 100 mL IVPB        2 g 200 mL/hr over 30 Minutes Intravenous Every 12 hours 01/22/2021 1558     01/18/2021 1845  remdesivir 200 mg in sodium chloride 0.9% 250 mL IVPB       See Hyperspace for full Linked Orders Report.   200 mg 580 mL/hr over 30 Minutes Intravenous Once 01/25/2021 1800 02/07/2021 2025   01/15/2021 1557  vancomycin variable dose per unstable renal function (pharmacist dosing)  Status:  Discontinued         Does not apply See admin instructions 02/01/2021 1558 02/04/21 1112   01/11/2021 1430  vancomycin (VANCOREADY) IVPB 1500 mg/300 mL        1,500 mg 150 mL/hr over 120 Minutes Intravenous  Once 01/16/2021 1403 01/22/2021 1641   01/18/2021 1415  ceFEPIme (MAXIPIME) 2 g in sodium chloride 0.9 % 100 mL IVPB        2 g 200 mL/hr over 30 Minutes Intravenous  Once 02/05/2021 1403 02/04/2021 1840   01/14/2021 1415  metroNIDAZOLE (FLAGYL) IVPB 500 mg        500 mg 100 mL/hr over 60 Minutes Intravenous  Once 02/06/2021 1403 02/02/2021 1938        Objective: Vitals:   02/04/21 2100 02/04/21 2201 02/05/21 0500 02/05/21 0830  BP: 129/73 122/87 124/67 119/80  Pulse: 94 90 90 85  Resp: 16 18 18    Temp: 99 F (37.2 C) 98.8 F (37.1 C) 98.8 F (37.1 C)   TempSrc:      SpO2: 100% 97% 96% 96%  Weight:      Height:        Intake/Output Summary (Last 24 hours) at 02/05/2021 1236 Last data filed at 02/05/2021 0600 Gross per 24 hour  Intake 1366.46 ml  Output 1250 ml  Net 116.46 ml   Filed Weights   01/12/2021 1438 01/13/2021 1715 02/04/21 0545  Weight: 65.1 kg 59.8 kg 61.9 kg    Examination: General exam: Appears comfortable  HEENT: PERRLA, oral mucosa moist, no sclera icterus or thrush Respiratory system: Clear to auscultation. Respiratory effort normal. Cardiovascular system: S1 & S2 heard, RRR.   Gastrointestinal system: Abdomen soft, non-tender, nondistended. Normal bowel sounds. Central nervous system: Alert and oriented. No focal neurological deficits. Extremities: No cyanosis, clubbing  or edema Skin: No rashes or ulcers Psychiatry:  Mood & affect appropriate.     Data Reviewed: I have personally reviewed following labs and imaging studies  CBC: Recent Labs  Lab 01/14/2021 1338 02/04/21 0130 02/05/21 0641  WBC 17.4* 9.9 6.9  NEUTROABS 13.6*  --   --   HGB 9.0* 8.1* 7.7*  HCT 31.0* 24.0* 22.0*  MCV 114.8* 97.2 98.7  PLT 491* 370 213   Basic Metabolic Panel: Recent Labs  Lab 01/09/2021 1442 01/15/2021 2032 02/04/21 0130 02/04/21 1011 02/04/21 1817 02/05/21 0641  NA 135 136 137 138  --  137  K 4.1 2.9* 3.1* 3.4* 3.4* 2.9*  CL 101 100 102 100  --  100  CO2 7* 17* 22 25  --  26  GLUCOSE 697* 359* 198* 110*  --  163*  BUN 24* 25* 25* 23  --  20  CREATININE 2.10* 1.92* 1.83* 1.82*  --  1.49*  CALCIUM 7.7* 7.6* 7.7* 7.8*  --  7.8*  MG 1.5*  --  1.8  --   --  2.0  PHOS 6.4*  --  3.0  --   --  2.5   GFR: Estimated Creatinine Clearance: 42.7 mL/min (A) (by C-G formula based on SCr of 1.49 mg/dL (H)). Liver Function Tests: Recent Labs  Lab 01/11/2021 1338 02/04/21 0130 02/05/21 0641  AST 29 30 25   ALT 5 8 9   ALKPHOS 105 79 65  BILITOT 1.9* 0.7 0.6  PROT 5.2* 4.6* 3.9*  ALBUMIN 2.5* 2.1* 1.8*   No results for input(s): LIPASE, AMYLASE in the last 168 hours. No results for input(s): AMMONIA in the last 168 hours. Coagulation Profile: Recent Labs  Lab 01/20/2021 1338  INR 1.3*   Cardiac Enzymes: No results for input(s): CKTOTAL, CKMB, CKMBINDEX, TROPONINI in the last 168 hours. BNP (last 3 results) No results for input(s): PROBNP in the last 8760 hours. HbA1C: No results for input(s): HGBA1C in the last 72 hours. CBG: Recent Labs  Lab 02/04/21 1951 02/04/21 2238 02/05/21 0415 02/05/21 0826 02/05/21 1150  GLUCAP 75 110* 170* 147* 211*   Lipid Profile: No results for input(s): CHOL, HDL, LDLCALC, TRIG, CHOLHDL, LDLDIRECT in the last 72 hours. Thyroid Function Tests: No results for input(s): TSH, T4TOTAL, FREET4, T3FREE, THYROIDAB in the last  72 hours. Anemia Panel: Recent Labs    02/04/21 0130 02/05/21 0641  FERRITIN 1,149* 613*   Urine analysis:    Component Value Date/Time   COLORURINE YELLOW (A) 01/25/2021 1442   APPEARANCEUR CLOUDY (A) 01/31/2021 1442   LABSPEC 1.016 02/02/2021 1442   PHURINE 5.0 01/24/2021 1442   GLUCOSEU >=500 (A) 01/28/2021 1442   HGBUR SMALL (A) 01/11/2021 1442   BILIRUBINUR NEGATIVE 01/09/2021 1442   KETONESUR 5 (A) 02/02/2021 1442   PROTEINUR 30 (A) 01/25/2021 1442   NITRITE NEGATIVE 01/28/2021 1442   LEUKOCYTESUR NEGATIVE 01/26/2021 1442   Sepsis Labs: @LABRCNTIP (procalcitonin:4,lacticidven:4) ) Recent Results (from the past 240 hour(s))  Resp Panel by RT-PCR (Flu A&B, Covid) Nasopharyngeal Swab     Status: None   Collection Time: 01/28/21  1:00 PM   Specimen: Nasopharyngeal Swab; Nasopharyngeal(NP) swabs in vial transport medium  Result Value Ref Range Status   SARS Coronavirus 2 by RT PCR NEGATIVE NEGATIVE Final    Comment: (NOTE) SARS-CoV-2 target nucleic acids are NOT DETECTED.  The SARS-CoV-2 RNA is generally detectable in upper respiratory specimens during the acute phase of infection. The lowest concentration of SARS-CoV-2 viral copies this assay can detect is 138 copies/mL. A negative result does not preclude SARS-Cov-2 infection and should not be used as the sole basis for treatment or other patient management decisions. A negative result may occur with  improper specimen collection/handling, submission of specimen other than nasopharyngeal swab, presence of viral mutation(s) within the areas targeted by this assay, and inadequate number of viral copies(<138 copies/mL). A negative result must be combined with clinical observations, patient history, and epidemiological information. The expected result is Negative.  Fact Sheet for Patients:  EntrepreneurPulse.com.au  Fact Sheet for Healthcare Providers:   IncredibleEmployment.be  This test is no t yet approved or cleared by the [place] FDA and  has been authorized for detection and/or diagnosis of SARS-CoV-2 by FDA under an Emergency Use Authorization (EUA). This  EUA will remain  in effect (meaning this test can be used) for the duration of the COVID-19 declaration under Section 564(b)(1) of the Act, 21 U.S.C.section 360bbb-3(b)(1), unless the authorization is terminated  or revoked sooner.       Influenza A by PCR NEGATIVE NEGATIVE Final   Influenza B by PCR NEGATIVE NEGATIVE Final    Comment: (NOTE) The Xpert Xpress SARS-CoV-2/FLU/RSV plus assay is intended as an aid in the diagnosis of influenza from Nasopharyngeal swab specimens and should not be used as a sole basis for treatment. Nasal washings and aspirates are unacceptable for Xpert Xpress SARS-CoV-2/FLU/RSV testing.  Fact Sheet for Patients: EntrepreneurPulse.com.au  Fact Sheet for Healthcare Providers: IncredibleEmployment.be  This test is not yet approved or cleared by the [place] FDA and has been authorized for detection and/or diagnosis of SARS-CoV-2 by FDA under an Emergency Use Authorization (EUA). This EUA will remain in effect (meaning this test can be used) for the duration of the COVID-19 declaration under Section 564(b)(1) of the Act, 21 U.S.C. section 360bbb-3(b)(1), unless the authorization is terminated or revoked.  Performed at [facility], [address] [place], [place] [address]   Resp Panel by RT-PCR (Flu A&B, Covid) Nasopharyngeal Swab     Status: Abnormal   Collection Time: 02/04/2021  2:42 PM   Specimen: Nasopharyngeal Swab; Nasopharyngeal(NP) swabs in vial transport medium  Result Value Ref Range Status   SARS Coronavirus 2 by RT PCR POSITIVE (A) NEGATIVE Final    Comment: RESULT CALLED TO, READ BACK BY AND VERIFIED WITH: SHANNON STARK @1744  01/26/2021  MJU (NOTE) SARS-CoV-2 target nucleic acids are DETECTED.  The SARS-CoV-2 RNA is generally detectable in upper respiratory specimens during the acute phase of infection. Positive results are indicative of the presence of the identified virus, but do not rule out bacterial infection or co-infection with other pathogens not detected by the test. Clinical correlation with patient history and other diagnostic information is necessary to determine patient infection status. The expected result is Negative.  Fact Sheet for Patients: EntrepreneurPulse.com.au  Fact Sheet for Healthcare Providers: IncredibleEmployment.be  This test is not yet approved or cleared by the [place] FDA and  has been authorized for detection and/or diagnosis of SARS-CoV-2 by FDA under an Emergency Use Authorization (EUA).  This EUA will remain in effect (meaning this test can be u sed) for the duration of  the COVID-19 declaration under Section 564(b)(1) of the Act, 21 U.S.C. section 360bbb-3(b)(1), unless the authorization is terminated or revoked sooner.     Influenza A by PCR NEGATIVE NEGATIVE Final   Influenza B by PCR NEGATIVE NEGATIVE Final    Comment: (NOTE) The Xpert Xpress SARS-CoV-2/FLU/RSV plus assay is intended as an aid in the diagnosis of influenza from Nasopharyngeal swab specimens and should not be used as a sole basis for treatment. Nasal washings and aspirates are unacceptable for Xpert Xpress SARS-CoV-2/FLU/RSV testing.  Fact Sheet for Patients: EntrepreneurPulse.com.au  Fact Sheet for Healthcare Providers: IncredibleEmployment.be  This test is not yet approved or cleared by the [place] FDA and has been authorized for detection and/or diagnosis of SARS-CoV-2 by FDA under an Emergency Use Authorization (EUA). This EUA will remain in effect (meaning this test can be used) for the duration of the COVID-19  declaration under Section 564(b)(1) of the Act, 21 U.S.C. section 360bbb-3(b)(1), unless the authorization is terminated or revoked.  Performed at [facility], [address], [place], [place] [address]   Blood Culture (routine x  2)     Status: None (Preliminary result)   Collection Time: 01/27/2021  2:42 PM   Specimen: BLOOD  Result Value Ref Range Status   Specimen Description BLOOD RIGHT ANTECUBITAL  Final   Special Requests   Final    BOTTLES DRAWN AEROBIC AND ANAEROBIC Blood Culture adequate volume   Culture   Final    NO GROWTH 2 DAYS Performed at [facility], [address], [place], [place] [address]    Report Status PENDING  Incomplete  Blood Culture (routine x 2)     Status: None (Preliminary result)   Collection Time: 01/22/2021  2:42 PM   Specimen: BLOOD  Result Value Ref Range Status   Specimen Description BLOOD BLOOD RIGHT HAND  Final   Special Requests   Final    BOTTLES DRAWN AEROBIC AND ANAEROBIC Blood Culture adequate volume   Culture   Final    NO GROWTH 2 DAYS Performed at [facility], [address], [place], [place] [address]    Report Status PENDING  Incomplete  Urine Culture     Status: None   Collection Time: 01/19/2021  2:42 PM   Specimen: In/Out Cath Urine  Result Value Ref Range Status   Specimen Description   Final    IN/OUT CATH URINE Performed at [facility], [address], [place], [place] [address]    Special Requests   Final    NONE Performed at [facility], [address], [place], [place] [address]    Culture   Final    NO GROWTH Performed at [facility], [place] [address] [place], [place] [address]    Report Status 02/04/2021 FINAL  Final  MRSA Next Gen by PCR, Nasal     Status: Abnormal   Collection Time: 02/05/2021  5:17 PM   Specimen: Nasal Mucosa; Nasal Swab  Result Value Ref Range Status   MRSA by PCR Next Gen DETECTED (A) NOT DETECTED Final    Comment: RESULT CALLED TO,  READ BACK BY AND VERIFIED WITH: SHANNON STARK @1855  ON 01/27/2021 SKL (NOTE) The GeneXpert MRSA Assay (FDA approved for NASAL specimens only), is one component of a comprehensive MRSA colonization surveillance program. It is not intended to diagnose MRSA infection nor to guide or monitor treatment for MRSA infections. Test performance is not FDA approved in patients less than 32 years old. Performed at [facility], [place], [place] [address]   C Difficile Quick Screen w PCR reflex     Status: Abnormal   Collection Time: 02/02/2021 11:15 PM   Specimen: STOOL  Result Value Ref Range Status   C Diff antigen POSITIVE (A) NEGATIVE Final   C Diff toxin NEGATIVE NEGATIVE Final   C Diff interpretation Results are indeterminate. See PCR results.  Final    Comment: Performed at [facility], [address] [place], [place] [address]  C. Diff by PCR, Reflexed     Status: Abnormal   Collection Time: 01/26/2021 11:15 PM  Result Value Ref Range Status   Toxigenic C. Difficile by PCR POSITIVE (A) NEGATIVE Final    Comment: Positive for toxigenic C. difficile with little to no toxin production. Only treat if clinical presentation suggests symptomatic illness. Performed at [facility], [address] [place], [place] [address]          Radiology Studies: [facility] [place] 1 View  Result Date: 01/26/2021 CLINICAL DATA:  Altered mental status, hypoxia, hyperglycemia,  recently treated for pneumonia EXAM: PORTABLE CHEST 1 VIEW COMPARISON:  Chest radiograph 01/20/2021 FINDINGS: The cardiomediastinal silhouette is stable. Hazy opacity in the right lower lobe is similar to the prior study. There is a small right pleural effusion. Dense retrocardiac opacity has worsened since the prior study, with silhouetting of the left hemidiaphragm. There is a suspected trace left pleural effusion. The left upper lung is well-aerated. There is no pneumothorax. A mildly displaced  fracture of the right sixth rib is again seen. There is no new acute osseous abnormality. IMPRESSION: 1. Worsening opacity in the left base with suspected trace left pleural effusion raises suspicion for developing left lower lobe pneumonia. 2. Persistent hazy opacities in the right lower lobe with a small right pleural effusion suggesting ongoing right basilar infection. 3. Recommend continued radiographic follow up to resolution. Electronically Signed   By: Valetta Mole M.D.   On: 01/24/2021 14:38      Scheduled Meds:  vitamin C  500 mg Oral Daily   Chlorhexidine Gluconate Cloth  6 each Topical Q0600   fludrocortisone  200 mcg Oral Daily   folic acid  1 mg Oral Daily   heparin  5,000 Units Subcutaneous Q8H   insulin aspart  0-9 Units Subcutaneous Q4H   insulin glargine-yfgn  6 Units Subcutaneous Daily   multivitamin with minerals  1 tablet Oral Daily   pantoprazole (PROTONIX) IV  40 mg Intravenous Q24H   thiamine injection  100 mg Intravenous Daily   zinc sulfate  220 mg Oral Daily   Continuous Infusions:  ceFEPime (MAXIPIME) IV 2 g (02/05/21 1028)   dextrose 5% lactated ringers 125 mL/hr at 02/05/21 0646   lactated ringers 125 mL/hr at 02/07/2021 2244   potassium chloride     remdesivir 100 mg in NS 100 mL 100 mg (02/05/21 1029)   vancomycin Stopped (02/04/21 1820)     LOS: 2 days      Debbe Odea, MD [facility] Pager: [email or web address] 02/05/2021, 12:36 PM

## 2021-02-05 NOTE — TOC Progression Note (Signed)
Transition of Care [facility]) - Progression Note    Patient Details  Name: John Reilly MRN: [id number] Date of Birth: 1956-01-17  [facility] CM/SW Contact  Eileen Stanford, LCSW Phone Number: 02/05/2021, 3:36 PM  Clinical Narrative:   Pt is positive Covid 9/26. Pt is from [facility]. FL2 completed and sent to SNF. Per [facility] pt can return 10/7.    Expected Discharge Plan: [place] Barriers to Discharge: Continued Medical Work up, SNF Pending bed offer  Expected Discharge Plan and Services Expected Discharge Plan: [place] In-house Referral: Clinical Social Work   Post Acute Care Choice: [place] ([facility])                                         Social Determinants of Health (SDOH) Interventions    Readmission Risk Interventions No flowsheet data found.

## 2021-02-05 NOTE — Consult Note (Addendum)
[facility] for Electrolyte Monitoring and Replacement   Recent Labs: Potassium (mmol/L)  Date Value  02/05/2021 2.9 (L)   Magnesium (mg/dL)  Date Value  02/05/2021 2.0   Calcium (mg/dL)  Date Value  02/05/2021 7.8 (L)   Albumin (g/dL)  Date Value  02/05/2021 1.8 (L)   Phosphorus (mg/dL)  Date Value  02/05/2021 2.5   Sodium (mmol/L)  Date Value  02/05/2021 137   Corrected Ca: 9.56 mg/dL  Assessment: 65 y.o. male with a past medical history of type 1 diabetes, COPD, hypertension, and hyperlipidemia who presents with altered mental status and DKA now transitioned to SSI + glargine.  Goal of Therapy:  Electrolytes WNL  Plan:  Per MD: 0.9% saline w/ 40 mEq KCl/L at 75 mL/hr Recheck electrolytes with am labs 09/29  Dallie Piles, PharmD, BCPS Clinical Pharmacist 02/05/2021 12:26 PM

## 2021-02-05 NOTE — NC FL2 (Signed)
[place] LEVEL OF CARE SCREENING TOOL     IDENTIFICATION  Patient Name: John Reilly Birthdate: Nov 05, 1955 Sex: male Admission Date (Current Location): 02/07/2021  [facility] [place] and [place] Number:  Engineering geologist and Address:  [facility], [address] [place], [place] [address]      Provider Number: [id number]  Attending Physician Name and Address:  Debbe Odea, MD  Relative Name and Phone Number:       Current Level of Care: Hospital Recommended Level of Care: [place] Prior Approval Number:    Date Approved/Denied:   PASRR Number:    Discharge Plan: SNF    Current Diagnoses: Patient Active Problem List   Diagnosis Date Noted   DKA (diabetic ketoacidosis) ([place]) 02/02/2021   Protein-calorie malnutrition, severe 01/22/2021   Hypoglycemia 01/20/2021   Type I diabetes mellitus ([place]) 05/13/7251   Acute metabolic encephalopathy 66/44/0347   COPD (chronic obstructive pulmonary disease) ([place])    Hypertension    Orthostatic hypotension    Parkinson disease ([place])    Hypothermia    Aspiration pneumonia ([place])    Alcohol abuse    Sepsis ([place])    AMS (altered mental status) 03/21/2020   Hypoglycemia due to insulin 03/21/2020   Type 2 diabetes mellitus with vascular disease ([place]) 03/11/2020   Pain due to onychomycosis of toenails of both feet 09/07/2019   Dermatitis 09/07/2019   S/p left hip fracture 08/10/2018   Altered mental status    Closed fracture of left hip ([place])    Fall    DKA (diabetic ketoacidoses) 10/13/2017   Malnutrition of moderate degree 10/04/2017   Humeral fracture 10/03/2017   Ulcer of toe ([place]) 04/27/2015   Hyponatremia 03/15/2015    Orientation RESPIRATION BLADDER Height & Weight     Situation  Normal Continent Weight: 136 lb 7.4 oz (61.9 kg) Height:  5\' 10"  (177.8 cm)  BEHAVIORAL SYMPTOMS/MOOD NEUROLOGICAL BOWEL NUTRITION STATUS      Incontinent Diet (carb modified, thin liquids)   AMBULATORY STATUS COMMUNICATION OF NEEDS Skin   Extensive Assist Verbally Normal                       Personal Care Assistance Level of Assistance  Bathing, Feeding, Dressing Bathing Assistance: Maximum assistance Feeding assistance: Independent Dressing Assistance: Maximum assistance Total Care Assistance: Maximum assistance   Functional Limitations Info  Sight, Hearing, Speech Sight Info: Adequate Hearing Info: Adequate Speech Info: Adequate    SPECIAL CARE FACTORS FREQUENCY  PT (By licensed PT), OT (By licensed OT)     PT Frequency: 5x OT Frequency: 5x            Contractures Contractures Info: Not present    Additional Factors Info  Code Status, Allergies, Isolation Precautions Code Status Info: full code Allergies Info: Penicillins, Sulfa Antibiotics     Isolation Precautions Info: COVID + on 9/26     Current Medications (02/05/2021):  This is the current hospital active medication list Current Facility-Administered Medications  Medication Dose Route Frequency Provider Last Rate Last Admin   0.9 % NaCl with KCl 40 mEq / L  infusion   Intravenous Continuous Rizwan, Saima, MD       ascorbic acid (VITAMIN C) tablet 500 mg  500 mg Oral Daily Darel Hong D, NP   500 mg at 02/05/21 0951   ceFEPIme (MAXIPIME) 2 g in sodium chloride 0.9 % 100 mL IVPB  2 g Intravenous Q12H Dennison Mascot  M, RPH 200 mL/hr at 02/05/21 1028 2 g at 02/05/21 1028   Chlorhexidine Gluconate Cloth 2 % PADS 6 each  6 each Topical Q0600 Flora Lipps, MD   6 each at 02/05/21 0516   dextrose 50 % solution 0-50 mL  0-50 mL Intravenous PRN Rada Hay, MD   45 mL at 02/04/21 0201   feeding supplement (ENSURE ENLIVE / ENSURE PLUS) liquid 237 mL  237 mL Oral TID BM Rizwan, Eunice Blase, MD       fludrocortisone (FLORINEF) tablet 200 mcg  200 mcg Oral Daily Darel Hong D, NP   200 mcg at 12/75/17 0017   folic acid (FOLVITE) tablet 1 mg  1 mg Oral Daily Darel Hong D, NP   1 mg at  02/05/21 0951   heparin injection 5,000 Units  5,000 Units Subcutaneous Q8H Darel Hong D, NP   5,000 Units at 02/05/21 0515   insulin aspart (novoLOG) injection 0-9 Units  0-9 Units Subcutaneous TID WC Rizwan, Eunice Blase, MD       insulin glargine-yfgn (SEMGLEE) injection 6 Units  6 Units Subcutaneous Daily Flora Lipps, MD   6 Units at 02/05/21 1246   midodrine (PROAMATINE) tablet 5 mg  5 mg Oral TID WC Debbe Odea, MD       multivitamin with minerals tablet 1 tablet  1 tablet Oral Daily Darel Hong D, NP   1 tablet at 02/05/21 0951   pantoprazole (PROTONIX) EC tablet 40 mg  40 mg Oral Daily Debbe Odea, MD       remdesivir 100 mg in sodium chloride 0.9 % 100 mL IVPB  100 mg Intravenous Daily Lu Duffel, RPH 200 mL/hr at 02/05/21 1029 100 mg at 02/05/21 1029   rivastigmine (EXELON) capsule 1.5 mg  1.5 mg Oral BID Debbe Odea, MD       thiamine tablet 100 mg  100 mg Oral Daily Rizwan, Eunice Blase, MD       zinc sulfate capsule 220 mg  220 mg Oral Daily Darel Hong D, NP   220 mg at 02/05/21 4944     Discharge Medications: Please see discharge summary for a list of discharge medications.  Relevant Imaging Results:  Relevant Lab Results:   Additional Information SSN: [id number]  Eileen Stanford, LCSW

## 2021-02-06 DIAGNOSIS — A419 Sepsis, unspecified organism: Secondary | ICD-10-CM | POA: Diagnosis not present

## 2021-02-06 DIAGNOSIS — E1011 Type 1 diabetes mellitus with ketoacidosis with coma: Secondary | ICD-10-CM | POA: Diagnosis not present

## 2021-02-06 DIAGNOSIS — N179 Acute kidney failure, unspecified: Secondary | ICD-10-CM | POA: Diagnosis not present

## 2021-02-06 DIAGNOSIS — U071 COVID-19: Secondary | ICD-10-CM | POA: Diagnosis not present

## 2021-02-06 LAB — COMPREHENSIVE METABOLIC PANEL
ALT: 11 U/L (ref 0–44)
AST: 16 U/L (ref 15–41)
Albumin: 1.9 g/dL — ABNORMAL LOW (ref 3.5–5.0)
Alkaline Phosphatase: 70 U/L (ref 38–126)
Anion gap: 6 (ref 5–15)
BUN: 19 mg/dL (ref 8–23)
CO2: 27 mmol/L (ref 22–32)
Calcium: 7.7 mg/dL — ABNORMAL LOW (ref 8.9–10.3)
Chloride: 106 mmol/L (ref 98–111)
Creatinine, Ser: 1.16 mg/dL (ref 0.61–1.24)
GFR, Estimated: >60 mL/min (ref >60)
Glucose, Bld: 159 mg/dL — ABNORMAL HIGH (ref 70–99)
Potassium: 3.5 mmol/L (ref 3.5–5.1)
Sodium: 139 mmol/L (ref 135–145)
Total Bilirubin: 0.9 mg/dL (ref 0.3–1.2)
Total Protein: 4.1 g/dL — ABNORMAL LOW (ref 6.5–8.1)

## 2021-02-06 LAB — CBC
HCT: 24.6 % — ABNORMAL LOW (ref 39.0–52.0)
Hemoglobin: 8.3 g/dL — ABNORMAL LOW (ref 13.0–17.0)
MCH: 33.2 pg (ref 26.0–34.0)
MCHC: 33.7 g/dL (ref 30.0–36.0)
MCV: 98.4 fL (ref 80.0–100.0)
Platelets: 244 10*3/uL (ref 150–400)
RBC: 2.5 MIL/uL — ABNORMAL LOW (ref 4.22–5.81)
RDW: 14.2 % (ref 11.5–15.5)
WBC: 7.8 10*3/uL (ref 4.0–10.5)
nRBC: 0 % (ref 0.0–0.2)

## 2021-02-06 LAB — GLUCOSE, CAPILLARY
Glucose-Capillary: 33 mg/dL — CL (ref 70–99)
Glucose-Capillary: 42 mg/dL — CL (ref 70–99)
Glucose-Capillary: 64 mg/dL — ABNORMAL LOW (ref 70–99)
Glucose-Capillary: 133 mg/dL — ABNORMAL HIGH (ref 70–99)
Glucose-Capillary: 161 mg/dL — ABNORMAL HIGH (ref 70–99)
Glucose-Capillary: 253 mg/dL — ABNORMAL HIGH (ref 70–99)
Glucose-Capillary: 274 mg/dL — ABNORMAL HIGH (ref 70–99)
Glucose-Capillary: 159 mg/dL — ABNORMAL HIGH (ref 70–99)
Glucose-Capillary: 152 mg/dL — ABNORMAL HIGH (ref 70–99)

## 2021-02-06 LAB — C-REACTIVE PROTEIN: CRP: 3.5 mg/dL — ABNORMAL HIGH (ref <1.0)

## 2021-02-06 LAB — FERRITIN: Ferritin: 490 ng/mL — ABNORMAL HIGH (ref 24–336)

## 2021-02-06 LAB — PHOSPHORUS: Phosphorus: 2.8 mg/dL (ref 2.5–4.6)

## 2021-02-06 LAB — MAGNESIUM: Magnesium: 1.7 mg/dL (ref 1.7–2.4)

## 2021-02-06 LAB — D-DIMER, QUANTITATIVE: D-Dimer, Quant: 0.7 ug/mL-FEU — ABNORMAL HIGH (ref 0.00–0.50)

## 2021-02-06 MED ORDER — INSULIN ASPART 100 UNIT/ML IJ SOLN
0.0000 [IU] | Freq: Three times a day (TID) | INTRAMUSCULAR | Status: DC
  Administered 2021-02-06: 3 [IU] via SUBCUTANEOUS
  Administered 2021-02-07 (×3): 1 [IU] via SUBCUTANEOUS
  Administered 2021-02-08 (×2): 2 [IU] via SUBCUTANEOUS
  Administered 2021-02-09 (×3): 1 [IU] via SUBCUTANEOUS
  Administered 2021-02-10 (×2): 2 [IU] via SUBCUTANEOUS
  Administered 2021-02-10: 1 [IU] via SUBCUTANEOUS
  Administered 2021-02-11: 3 [IU] via SUBCUTANEOUS
  Administered 2021-02-11: 2 [IU] via SUBCUTANEOUS
  Administered 2021-02-11: 1 [IU] via SUBCUTANEOUS
  Administered 2021-02-12 (×2): 5 [IU] via SUBCUTANEOUS
  Filled 2021-02-06 (×17): qty 1

## 2021-02-06 MED ORDER — MAGNESIUM SULFATE 2 GM/50ML IV SOLN
2.0000 g | Freq: Once | INTRAVENOUS | Status: AC
  Administered 2021-02-06: 2 g via INTRAVENOUS
  Filled 2021-02-06: qty 50

## 2021-02-06 MED ORDER — INSULIN GLARGINE-YFGN 100 UNIT/ML ~~LOC~~ SOLN
4.0000 [IU] | Freq: Every day | SUBCUTANEOUS | Status: DC
  Administered 2021-02-06 – 2021-02-09 (×4): 4 [IU] via SUBCUTANEOUS
  Filled 2021-02-06 (×4): qty 0.04

## 2021-02-06 NOTE — Progress Notes (Signed)
Inpatient Diabetes Program Recommendations  AACE/ADA: New Consensus Statement on Inpatient Glycemic Control (2015)  Target Ranges:  Prepandial:   less than 140 mg/dL      Peak postprandial:   less than 180 mg/dL (1-2 hours)      Critically ill patients:  140 - 180 mg/dL   Lab Results  Component Value Date   GLUCAP 133 (H) 02/06/2021   HGBA1C 5.2 01/23/2021    Review of Glycemic Control Results for John Reilly, John Reilly (MRN [id number]) as of 02/06/2021 07:50  Ref. Range 02/05/2021 08:26 02/05/2021 11:50 02/05/2021 16:07 02/06/2021 01:51 02/06/2021 02:11 02/06/2021 02:58 02/06/2021 06:11  Glucose-Capillary Latest Ref Range: 70 - 99 mg/dL 147 (H) Novolog 1 unit 211 (H) Novolog 3 units 78 33 (LL) 42 (LL) 64 (L) 133 (H)   Diabetes history: DM1 SNF Meds: Lantus 6 units QHS                     Humalog 0-9 units TID with meals per SSI        Humalog 3 units TID with meals  for carbohydrate coverage Current orders for Inpatient glycemic control: Semglee 6 units qd, Novolog 0-9 units correction tid with meals  Inpatient Diabetes Program Recommendations:   -Decrease Novolog correction to 0-6 units tid -Decrease Semglee to 5 units daily . Thank you, Nani Gasser. Kesha Hurrell, RN, MSN, CDE  Diabetes Coordinator Inpatient Glycemic Control Team Team Pager [phone] (8am-5pm) 02/06/2021 7:54 AM

## 2021-02-06 NOTE — TOC Progression Note (Signed)
Transition of Care [facility]) - Progression Note    Patient Details  Name: John Reilly MRN: [id number] Date of Birth: 1955/10/23  [facility] CM/SW [place], LCSW Phone Number: 02/06/2021, 12:39 PM  Clinical Narrative:  Called patient in the room. He declined SA resources. Told him to notify CSW if he changes his mind.   Expected Discharge Plan: [place] Barriers to Discharge: Continued Medical Work up, SNF Pending bed offer  Expected Discharge Plan and Services Expected Discharge Plan: [place] In-house Referral: Clinical Social Work   Post Acute Care Choice: [place] ([facility])                                         Social Determinants of Health (SDOH) Interventions    Readmission Risk Interventions No flowsheet data found.

## 2021-02-06 NOTE — Consult Note (Signed)
[place] for Electrolyte Monitoring and Replacement   Recent Labs: Potassium (mmol/L)  Date Value  02/06/2021 3.5   Magnesium (mg/dL)  Date Value  02/06/2021 1.7   Calcium (mg/dL)  Date Value  02/06/2021 7.7 (L)   Albumin (g/dL)  Date Value  02/06/2021 1.9 (L)   Phosphorus (mg/dL)  Date Value  02/06/2021 2.8   Sodium (mmol/L)  Date Value  02/06/2021 139   Corrected Ca: 9.4 mg/dL  Assessment: 65 y.o. male with a past medical history of type 1 diabetes, COPD, hypertension, and hyperlipidemia who presents with altered mental status and DKA now transitioned to SSI + glargine.  Goal of Therapy:  Electrolytes WNL  Plan:  Continue 0.9% saline w/ 40 mEq KCl/L at 75 mL/hr Will give Mg 2g IV x 1  Recheck electrolytes with am labs 09/29  Oswald Hillock, PharmD, BCPS Clinical Pharmacist 02/06/2021 7:54 AM

## 2021-02-06 NOTE — Progress Notes (Signed)
PROGRESS NOTE    John Reilly   QJF:[id number]  DOB: 12-18-1955  DOA: 01/19/2021 PCP: Adin Hector, MD   Brief Narrative:  John Reilly is a 65 year old male with type 1 diabetes, COPD, hypertension, alcohol abuse, Parkinson's disease, hyperlipidemia who presented from a skilled nursing facility to the ED on 9/26 with confusion.    In the ED, noted to be in DKA and septic shock secondary to suspected pneumonia.  Admitted to the ICU, started on broad-spectrum antibiotics, Levophed and insulin infusions. His condition improved to be transferred out of the ICU and to the triad hospitalist service today.  Patient was recently hospitalized from 9/12 through 9/20 and he was found hypoglycemic and unresponsive by his wife.  In the hospital he was treated for sepsis secondary to aspiration pneumonia.  He was discharged to skilled nursing facility.  Subjective: He has a mild cough today.  He has no appetite.    Assessment & Plan:    Principal problem: Septic shock secondary to COVID-19 related pneumonia and possibly aspiration pneumonia -Chest x-ray on 9/26 revealed worsening opacity in the left base and persistent hazy opacities in the right lower lobe - Being treated with remdesivir, vancomycin and cefepime-blood cultures have remained negative-I will DC vancomycin -Of note, C. difficile PCR is positive and antigen is positive but toxin is negative-he continues to have watery stools but these may be secondary to the COVID-19 infection and therefore I will hold off on treating for C. difficile colitis  Active Problems:  Diabetes mellitus type 1 with DKA -Resolved-continue Semglee and NovoLog sliding scale -Hemoglobin A1C 5/2 -Home medication includes Lantus and Humalog- - Hypoglycemia> drop Lantus dose of 4 U and reduce Humolog sliding scale  AKI, anion gap metabolic acidosis, hyponatremia - Baseline creatinine is about 0.8 2.9 - Admitted with creatinine of 2.10 which has  steadily improved to normal - continue IV fluids today  Acute metabolic encephalopathy - Secondary to sepsis - Improving  Diarrhea - Related to COVID-19 infection but also may be due to Creon not being resumed - Continue IV fluids to prevent dehydration  Orthostatic hypotension - ?  If related to Parkinson's disease - Patient is on Florinef and midodrine as which outpatient are being continued  COPD exacerbation? -Per critical care notes, they felt the patient had a COPD exacerbation on admission - No wheezing noted at this time-  Hypokalemia - Likely secondary to diarrhea - Continue to replace aggressively - Magnesium 2.0  Normocytic anemia - Continue to follow  Pancreatic insufficiency - Resume Creon  Parkinson's disease - Continue Sinemet  Alzheimer's dementia - Continue Exelon  Severe protein calorie malnutrition - Continue dietary supplements    Time spent in minutes: 35 DVT prophylaxis: heparin injection 5,000 Units Start: 02/01/2021 1600 SCDs Start: 02/06/2021 1549 Code Status: Full code Family Communication:  Level of Care: Level of care: Med-Surg Disposition Plan:  Status is: Inpatient  Remains inpatient appropriate because:IV treatments appropriate due to intensity of illness or inability to take PO  Dispo: The patient is from: SNF              Anticipated d/c is to: SNF              Patient currently is not medically stable to d/c.   Difficult to place patient No      Consultants:  critical care service Procedures:   Antimicrobials:  Anti-infectives (From admission, onward)    Start     Dose/Rate Route  Frequency Ordered Stop   02/04/21 1600  vancomycin (VANCOREADY) IVPB 750 mg/150 mL  Status:  Discontinued        750 mg 150 mL/hr over 60 Minutes Intravenous Every 24 hours 02/04/21 1112 02/05/21 1252   02/04/21 1000  remdesivir 100 mg in sodium chloride 0.9 % 100 mL IVPB       See Hyperspace for full Linked Orders Report.   100 mg 200  mL/hr over 30 Minutes Intravenous Daily 01/26/2021 1800 02/08/21 0959   02/04/21 0200  ceFEPIme (MAXIPIME) 2 g in sodium chloride 0.9 % 100 mL IVPB        2 g 200 mL/hr over 30 Minutes Intravenous Every 12 hours 01/24/2021 1558     02/02/2021 1845  remdesivir 200 mg in sodium chloride 0.9% 250 mL IVPB       See Hyperspace for full Linked Orders Report.   200 mg 580 mL/hr over 30 Minutes Intravenous Once 02/06/2021 1800 01/18/2021 2025   02/05/2021 1557  vancomycin variable dose per unstable renal function (pharmacist dosing)  Status:  Discontinued         Does not apply See admin instructions 01/12/2021 1558 02/04/21 1112   01/11/2021 1430  vancomycin (VANCOREADY) IVPB 1500 mg/300 mL        1,500 mg 150 mL/hr over 120 Minutes Intravenous  Once 02/02/2021 1403 01/31/2021 1641   01/15/2021 1415  ceFEPIme (MAXIPIME) 2 g in sodium chloride 0.9 % 100 mL IVPB        2 g 200 mL/hr over 30 Minutes Intravenous  Once 01/12/2021 1403 01/12/2021 1840   01/22/2021 1415  metroNIDAZOLE (FLAGYL) IVPB 500 mg        500 mg 100 mL/hr over 60 Minutes Intravenous  Once 02/05/2021 1403 01/09/2021 1938        Objective: Vitals:   02/05/21 1610 02/05/21 2113 02/06/21 0619 02/06/21 0700  BP: 107/62 (!) 100/50 123/81 124/85  Pulse: 70 71 66 67  Resp:   20 18  Temp: 97.8 F (36.6 C) 97.8 F (36.6 C) (!) 97.5 F (36.4 C) (!) 97.5 F (36.4 C)  TempSrc:   Oral Axillary  SpO2: 99% 99% 97% 100%  Weight:      Height:        Intake/Output Summary (Last 24 hours) at 02/06/2021 1343 Last data filed at 02/05/2021 2115 Gross per 24 hour  Intake --  Output 800 ml  Net -800 ml    Filed Weights   02/04/2021 1438 01/31/2021 1715 02/04/21 0545  Weight: 65.1 kg 59.8 kg 61.9 kg    Examination: General exam: Appears comfortable  HEENT: PERRLA, oral mucosa moist, no sclera icterus or thrush Respiratory system: Clear to auscultation. Respiratory effort normal. Cardiovascular system: S1 & S2 heard, regular rate and rhythm Gastrointestinal  system: Abdomen soft, non-tender, nondistended. Normal bowel sounds   Central nervous system: Alert and oriented. No focal neurological deficits. Extremities: No cyanosis, clubbing or edema Skin: No rashes or ulcers Psychiatry:  Mood & affect appropriate.      Data Reviewed: I have personally reviewed following labs and imaging studies  CBC: Recent Labs  Lab 01/18/2021 1338 02/04/21 0130 02/05/21 0641 02/06/21 0640  WBC 17.4* 9.9 6.9 7.8  NEUTROABS 13.6*  --   --   --   HGB 9.0* 8.1* 7.7* 8.3*  HCT 31.0* 24.0* 22.0* 24.6*  MCV 114.8* 97.2 98.7 98.4  PLT 491* 370 252 572    Basic Metabolic Panel: Recent Labs  Lab 02/02/2021 1442 01/24/2021  2032 02/04/21 0130 02/04/21 1011 02/04/21 1817 02/05/21 0641 02/06/21 0640  NA 135 136 137 138  --  137 139  K 4.1 2.9* 3.1* 3.4* 3.4* 2.9* 3.5  CL 101 100 102 100  --  100 106  CO2 7* 17* 22 25  --  26 27  GLUCOSE 697* 359* 198* 110*  --  163* 159*  BUN 24* 25* 25* 23  --  20 19  CREATININE 2.10* 1.92* 1.83* 1.82*  --  1.49* 1.16  CALCIUM 7.7* 7.6* 7.7* 7.8*  --  7.8* 7.7*  MG 1.5*  --  1.8  --   --  2.0 1.7  PHOS 6.4*  --  3.0  --   --  2.5 2.8    GFR: Estimated Creatinine Clearance: 54.8 mL/min (by C-G formula based on SCr of 1.16 mg/dL). Liver Function Tests: Recent Labs  Lab 02/06/2021 1338 02/04/21 0130 02/05/21 0641 02/06/21 0640  AST 29 30 25 16   ALT 5 8 9 11   ALKPHOS 105 79 65 70  BILITOT 1.9* 0.7 0.6 0.9  PROT 5.2* 4.6* 3.9* 4.1*  ALBUMIN 2.5* 2.1* 1.8* 1.9*    No results for input(s): LIPASE, AMYLASE in the last 168 hours. No results for input(s): AMMONIA in the last 168 hours. Coagulation Profile: Recent Labs  Lab 01/21/2021 1338  INR 1.3*    Cardiac Enzymes: No results for input(s): CKTOTAL, CKMB, CKMBINDEX, TROPONINI in the last 168 hours. BNP (last 3 results) No results for input(s): PROBNP in the last 8760 hours. HbA1C: No results for input(s): HGBA1C in the last 72 hours. CBG: Recent Labs  Lab  02/06/21 0211 02/06/21 0258 02/06/21 0611 02/06/21 0812 02/06/21 1205  GLUCAP 42* 64* 133* 161* 253*    Lipid Profile: No results for input(s): CHOL, HDL, LDLCALC, TRIG, CHOLHDL, LDLDIRECT in the last 72 hours. Thyroid Function Tests: No results for input(s): TSH, T4TOTAL, FREET4, T3FREE, THYROIDAB in the last 72 hours. Anemia Panel: Recent Labs    02/05/21 0641 02/06/21 0640  FERRITIN 613* 490*    Urine analysis:    Component Value Date/Time   COLORURINE YELLOW (A) 01/09/2021 1442   APPEARANCEUR CLOUDY (A) 01/19/2021 1442   LABSPEC 1.016 01/16/2021 1442   PHURINE 5.0 01/29/2021 1442   GLUCOSEU >=500 (A) 01/29/2021 1442   HGBUR SMALL (A) 01/17/2021 1442   BILIRUBINUR NEGATIVE 02/02/2021 1442   KETONESUR 5 (A) 01/27/2021 1442   PROTEINUR 30 (A) 01/10/2021 1442   NITRITE NEGATIVE 01/19/2021 1442   LEUKOCYTESUR NEGATIVE 02/02/2021 1442   Sepsis Labs: @LABRCNTIP (procalcitonin:4,lacticidven:4) ) Recent Results (from the past 240 hour(s))  Resp Panel by RT-PCR (Flu A&B, Covid) Nasopharyngeal Swab     Status: None   Collection Time: 01/28/21  1:00 PM   Specimen: Nasopharyngeal Swab; Nasopharyngeal(NP) swabs in vial transport medium  Result Value Ref Range Status   SARS Coronavirus 2 by RT PCR NEGATIVE NEGATIVE Final    Comment: (NOTE) SARS-CoV-2 target nucleic acids are NOT DETECTED.  The SARS-CoV-2 RNA is generally detectable in upper respiratory specimens during the acute phase of infection. The lowest concentration of SARS-CoV-2 viral copies this assay can detect is 138 copies/mL. A negative result does not preclude SARS-Cov-2 infection and should not be used as the sole basis for treatment or other patient management decisions. A negative result may occur with  improper specimen collection/handling, submission of specimen other than nasopharyngeal swab, presence of viral mutation(s) within the areas targeted by this assay, and inadequate number of  viral copies(<138 copies/mL).  A negative result must be combined with clinical observations, patient history, and epidemiological information. The expected result is Negative.  Fact Sheet for Patients:  EntrepreneurPulse.com.au  Fact Sheet for Healthcare Providers:  IncredibleEmployment.be  This test is no t yet approved or cleared by the [place] FDA and  has been authorized for detection and/or diagnosis of SARS-CoV-2 by FDA under an Emergency Use Authorization (EUA). This EUA will remain  in effect (meaning this test can be used) for the duration of the COVID-19 declaration under Section 564(b)(1) of the Act, 21 U.S.C.section 360bbb-3(b)(1), unless the authorization is terminated  or revoked sooner.       Influenza A by PCR NEGATIVE NEGATIVE Final   Influenza B by PCR NEGATIVE NEGATIVE Final    Comment: (NOTE) The Xpert Xpress SARS-CoV-2/FLU/RSV plus assay is intended as an aid in the diagnosis of influenza from Nasopharyngeal swab specimens and should not be used as a sole basis for treatment. Nasal washings and aspirates are unacceptable for Xpert Xpress SARS-CoV-2/FLU/RSV testing.  Fact Sheet for Patients: EntrepreneurPulse.com.au  Fact Sheet for Healthcare Providers: IncredibleEmployment.be  This test is not yet approved or cleared by the [place] FDA and has been authorized for detection and/or diagnosis of SARS-CoV-2 by FDA under an Emergency Use Authorization (EUA). This EUA will remain in effect (meaning this test can be used) for the duration of the COVID-19 declaration under Section 564(b)(1) of the Act, 21 U.S.C. section 360bbb-3(b)(1), unless the authorization is terminated or revoked.  Performed at [facility], [address] [place], [place] [address]   Resp Panel by RT-PCR (Flu A&B, Covid) Nasopharyngeal Swab     Status: Abnormal   Collection Time: 01/12/2021   2:42 PM   Specimen: Nasopharyngeal Swab; Nasopharyngeal(NP) swabs in vial transport medium  Result Value Ref Range Status   SARS Coronavirus 2 by RT PCR POSITIVE (A) NEGATIVE Final    Comment: RESULT CALLED TO, READ BACK BY AND VERIFIED WITH: SHANNON STARK @1744  01/21/2021 MJU (NOTE) SARS-CoV-2 target nucleic acids are DETECTED.  The SARS-CoV-2 RNA is generally detectable in upper respiratory specimens during the acute phase of infection. Positive results are indicative of the presence of the identified virus, but do not rule out bacterial infection or co-infection with other pathogens not detected by the test. Clinical correlation with patient history and other diagnostic information is necessary to determine patient infection status. The expected result is Negative.  Fact Sheet for Patients: EntrepreneurPulse.com.au  Fact Sheet for Healthcare Providers: IncredibleEmployment.be  This test is not yet approved or cleared by the [place] FDA and  has been authorized for detection and/or diagnosis of SARS-CoV-2 by FDA under an Emergency Use Authorization (EUA).  This EUA will remain in effect (meaning this test can be u sed) for the duration of  the COVID-19 declaration under Section 564(b)(1) of the Act, 21 U.S.C. section 360bbb-3(b)(1), unless the authorization is terminated or revoked sooner.     Influenza A by PCR NEGATIVE NEGATIVE Final   Influenza B by PCR NEGATIVE NEGATIVE Final    Comment: (NOTE) The Xpert Xpress SARS-CoV-2/FLU/RSV plus assay is intended as an aid in the diagnosis of influenza from Nasopharyngeal swab specimens and should not be used as a sole basis for treatment. Nasal washings and aspirates are unacceptable for Xpert Xpress SARS-CoV-2/FLU/RSV testing.  Fact Sheet for Patients: EntrepreneurPulse.com.au  Fact Sheet for Healthcare Providers: IncredibleEmployment.be  This test is  not yet approved or cleared by the [place] and has been authorized  for detection and/or diagnosis of SARS-CoV-2 by FDA under an Emergency Use Authorization (EUA). This EUA will remain in effect (meaning this test can be used) for the duration of the COVID-19 declaration under Section 564(b)(1) of the Act, 21 U.S.C. section 360bbb-3(b)(1), unless the authorization is terminated or revoked.  Performed at [facility], [address] [place], [place] [address]   Blood Culture (routine x 2)     Status: None (Preliminary result)   Collection Time: 01/23/2021  2:42 PM   Specimen: BLOOD  Result Value Ref Range Status   Specimen Description BLOOD RIGHT ANTECUBITAL  Final   Special Requests   Final    BOTTLES DRAWN AEROBIC AND ANAEROBIC Blood Culture adequate volume   Culture   Final    NO GROWTH 3 DAYS Performed at [facility], [address], [place], [place] [address]    Report Status PENDING  Incomplete  Blood Culture (routine x 2)     Status: None (Preliminary result)   Collection Time: 02/05/2021  2:42 PM   Specimen: BLOOD  Result Value Ref Range Status   Specimen Description BLOOD BLOOD RIGHT HAND  Final   Special Requests   Final    BOTTLES DRAWN AEROBIC AND ANAEROBIC Blood Culture adequate volume   Culture   Final    NO GROWTH 3 DAYS Performed at [facility], [address] [place], [place] [address]    Report Status PENDING  Incomplete  Urine Culture     Status: None   Collection Time: 01/15/2021  2:42 PM   Specimen: In/Out Cath Urine  Result Value Ref Range Status   Specimen Description   Final    IN/OUT CATH URINE Performed at [facility], [address], [place], [place] [address]    Special Requests   Final    NONE Performed at [facility], [address] [place], [place] [address]    Culture   Final    NO GROWTH Performed at [facility], [place] [address], [place], [place] [address]    Report  Status 02/04/2021 FINAL  Final  MRSA Next Gen by PCR, Nasal     Status: Abnormal   Collection Time: 02/06/2021  5:17 PM   Specimen: Nasal Mucosa; Nasal Swab  Result Value Ref Range Status   MRSA by PCR Next Gen DETECTED (A) NOT DETECTED Final    Comment: RESULT CALLED TO, READ BACK BY AND VERIFIED WITH: SHANNON STARK @1855  ON 01/14/2021 SKL (NOTE) The GeneXpert MRSA Assay (FDA approved for NASAL specimens only), is one component of a comprehensive MRSA colonization surveillance program. It is not intended to diagnose MRSA infection nor to guide or monitor treatment for MRSA infections. Test performance is not FDA approved in patients less than 82 years old. Performed at [facility], [place], [place] [address]   C Difficile Quick Screen w PCR reflex     Status: Abnormal   Collection Time: 01/30/2021 11:15 PM   Specimen: STOOL  Result Value Ref Range Status   C Diff antigen POSITIVE (A) NEGATIVE Final   C Diff toxin NEGATIVE NEGATIVE Final   C Diff interpretation Results are indeterminate. See PCR results.  Final    Comment: Performed at [facility], [address] [place], [place] [address]  C. Diff by PCR, Reflexed     Status: Abnormal   Collection Time: 01/27/2021 11:15 PM  Result Value Ref Range Status   Toxigenic C. Difficile  by PCR POSITIVE (A) NEGATIVE Final    Comment: Positive for toxigenic C. difficile with little to no toxin production. Only treat if clinical presentation suggests symptomatic illness. Performed at [facility], [address] [place] [address], [place], [place] [address]          Radiology Studies: No results found.    Scheduled Meds:  vitamin C  500 mg Oral Daily   Chlorhexidine Gluconate Cloth  6 each Topical Q0600   feeding supplement  237 mL Oral TID BM   fludrocortisone  200 mcg Oral Daily   folic acid  1 mg Oral Daily   heparin  5,000 Units Subcutaneous Q8H   insulin aspart  0-6 Units Subcutaneous TID WC    insulin glargine-yfgn  4 Units Subcutaneous Daily   midodrine  5 mg Oral TID WC   multivitamin with minerals  1 tablet Oral Daily   pantoprazole  40 mg Oral Daily   rivastigmine  1.5 mg Oral BID   thiamine  100 mg Oral Daily   zinc sulfate  220 mg Oral Daily   Continuous Infusions:  0.9 % NaCl with KCl 40 mEq / L 75 mL/hr at 02/06/21 0305   ceFEPime (MAXIPIME) IV 2 g (02/06/21 1224)   remdesivir 100 mg in NS 100 mL 100 mg (02/06/21 1045)     LOS: 3 days      Debbe Odea, MD [facility] Pager: [email or web address] 02/06/2021, 1:43 PM

## 2021-02-06 NOTE — Plan of Care (Signed)

## 2021-02-07 DIAGNOSIS — E1011 Type 1 diabetes mellitus with ketoacidosis with coma: Secondary | ICD-10-CM | POA: Diagnosis not present

## 2021-02-07 DIAGNOSIS — A419 Sepsis, unspecified organism: Secondary | ICD-10-CM | POA: Diagnosis not present

## 2021-02-07 DIAGNOSIS — N179 Acute kidney failure, unspecified: Secondary | ICD-10-CM | POA: Diagnosis not present

## 2021-02-07 DIAGNOSIS — U071 COVID-19: Secondary | ICD-10-CM | POA: Diagnosis not present

## 2021-02-07 LAB — COMPREHENSIVE METABOLIC PANEL
ALT: 12 U/L (ref 0–44)
AST: 14 U/L — ABNORMAL LOW (ref 15–41)
Albumin: 1.8 g/dL — ABNORMAL LOW (ref 3.5–5.0)
Alkaline Phosphatase: 73 U/L (ref 38–126)
Anion gap: 6 (ref 5–15)
BUN: 21 mg/dL (ref 8–23)
CO2: 24 mmol/L (ref 22–32)
Calcium: 7.7 mg/dL — ABNORMAL LOW (ref 8.9–10.3)
Chloride: 109 mmol/L (ref 98–111)
Creatinine, Ser: 1.14 mg/dL (ref 0.61–1.24)
GFR, Estimated: >60 mL/min (ref >60)
Glucose, Bld: 161 mg/dL — ABNORMAL HIGH (ref 70–99)
Potassium: 3.8 mmol/L (ref 3.5–5.1)
Sodium: 139 mmol/L (ref 135–145)
Total Bilirubin: 0.7 mg/dL (ref 0.3–1.2)
Total Protein: 4.2 g/dL — ABNORMAL LOW (ref 6.5–8.1)

## 2021-02-07 LAB — CBC
HCT: 25.9 % — ABNORMAL LOW (ref 39.0–52.0)
Hemoglobin: 8.4 g/dL — ABNORMAL LOW (ref 13.0–17.0)
MCH: 32.3 pg (ref 26.0–34.0)
MCHC: 32.4 g/dL (ref 30.0–36.0)
MCV: 99.6 fL (ref 80.0–100.0)
Platelets: 263 10*3/uL (ref 150–400)
RBC: 2.6 MIL/uL — ABNORMAL LOW (ref 4.22–5.81)
RDW: 13.7 % (ref 11.5–15.5)
WBC: 9.2 10*3/uL (ref 4.0–10.5)
nRBC: 0 % (ref 0.0–0.2)

## 2021-02-07 LAB — MAGNESIUM: Magnesium: 2 mg/dL (ref 1.7–2.4)

## 2021-02-07 LAB — C-REACTIVE PROTEIN: CRP: 2.4 mg/dL — ABNORMAL HIGH (ref <1.0)

## 2021-02-07 LAB — GLUCOSE, CAPILLARY
Glucose-Capillary: 147 mg/dL — ABNORMAL HIGH (ref 70–99)
Glucose-Capillary: 175 mg/dL — ABNORMAL HIGH (ref 70–99)
Glucose-Capillary: 179 mg/dL — ABNORMAL HIGH (ref 70–99)
Glucose-Capillary: 158 mg/dL — ABNORMAL HIGH (ref 70–99)
Glucose-Capillary: 93 mg/dL (ref 70–99)

## 2021-02-07 LAB — PHOSPHORUS: Phosphorus: 2.5 mg/dL (ref 2.5–4.6)

## 2021-02-07 LAB — D-DIMER, QUANTITATIVE: D-Dimer, Quant: 0.73 ug/mL-FEU — ABNORMAL HIGH (ref 0.00–0.50)

## 2021-02-07 LAB — FERRITIN: Ferritin: 417 ng/mL — ABNORMAL HIGH (ref 24–336)

## 2021-02-07 MED ORDER — ACETAMINOPHEN 325 MG PO TABS
650.0000 mg | ORAL_TABLET | Freq: Four times a day (QID) | ORAL | Status: DC | PRN
  Administered 2021-02-07 – 2021-02-11 (×8): 650 mg via ORAL
  Filled 2021-02-07 (×9): qty 2

## 2021-02-07 MED ORDER — IBUPROFEN 400 MG PO TABS
400.0000 mg | ORAL_TABLET | Freq: Four times a day (QID) | ORAL | Status: DC | PRN
  Administered 2021-02-08 – 2021-02-12 (×8): 400 mg via ORAL
  Filled 2021-02-07 (×8): qty 1

## 2021-02-07 MED ORDER — SODIUM CHLORIDE 0.9 % IV SOLN
2.0000 g | Freq: Two times a day (BID) | INTRAVENOUS | Status: AC
  Administered 2021-02-07 – 2021-02-08 (×2): 2 g via INTRAVENOUS
  Filled 2021-02-07 (×2): qty 2

## 2021-02-07 MED ORDER — BISMUTH SUBSALICYLATE 262 MG/15ML PO SUSP
30.0000 mL | Freq: Four times a day (QID) | ORAL | Status: DC
  Administered 2021-02-07 – 2021-02-12 (×17): 30 mL via ORAL
  Filled 2021-02-07 (×4): qty 118

## 2021-02-07 NOTE — Consult Note (Signed)
[facility] for Electrolyte Monitoring and Replacement   Recent Labs: Potassium (mmol/L)  Date Value  02/07/2021 3.8   Magnesium (mg/dL)  Date Value  02/07/2021 2.0   Calcium (mg/dL)  Date Value  02/07/2021 7.7 (L)   Albumin (g/dL)  Date Value  02/07/2021 1.8 (L)   Phosphorus (mg/dL)  Date Value  02/07/2021 2.5   Sodium (mmol/L)  Date Value  02/07/2021 139   Corrected Ca: 9.4 mg/dL  Assessment: 65 y.o. male with a past medical history of type 1 diabetes, COPD, hypertension, and hyperlipidemia who presents with altered mental status and DKA now transitioned to SSI + glargine.  Goal of Therapy:  Electrolytes WNL  Plan:  Continue 0.9% saline w/ 40 mEq KCl/L at 75 mL/hr Recheck electrolytes with am labs   Oswald Hillock, PharmD, BCPS Clinical Pharmacist 02/07/2021 8:33 AM

## 2021-02-07 NOTE — Progress Notes (Signed)
PROGRESS NOTE    John Reilly   KVQ:[id number]  DOB: Feb 15, 1956  DOA: 01/17/2021 PCP: Adin Hector, MD   Brief Narrative:  John Reilly is a 65 year old male with type 1 diabetes, COPD, hypertension, alcohol abuse, Parkinson's disease, hyperlipidemia who presented from a skilled nursing facility to the ED on 9/26 with confusion.    In the ED, noted to be in DKA and septic shock secondary to suspected pneumonia.  Admitted to the ICU, started on broad-spectrum antibiotics, Levophed and insulin infusions. His condition improved to be transferred out of the ICU and to the triad hospitalist service today.  Patient was recently hospitalized from 9/12 through 9/20 and he was found hypoglycemic and unresponsive by his wife.  In the hospital he was treated for sepsis secondary to aspiration pneumonia.  He was discharged to skilled nursing facility.  Subjective: He has a mild cough today. His appetite is improving. No other complaints.     Assessment & Plan:    Principal problem: Septic shock secondary to COVID-19 related pneumonia and possibly aspiration pneumonia -Chest x-ray on 9/26 revealed worsening opacity in the left base and persistent hazy opacities in the right lower lobe - Being treated with Remdesivir and Cefepime -blood cultures have remained negative- vancomycin discontinued -Of note, C. difficile PCR is positive and antigen is positive but toxin is negative-he continues to have watery stools but these may be secondary to the COVID-19 infection and therefore I will hold off on treating for C. difficile colitis   Active Problems:  AKI, anion gap metabolic acidosis, hyponatremia - Baseline creatinine is about 0.8 2.9 - Admitted with creatinine of 2.10 which has steadily improved to normal - Urine output has been poor as patient is third spacing fluids- will stop IVF today and encourage him to eat and drink- I have asked the RN to offer fluids every 2-3 hrs today.    Hypoalbuminemia with third spacing of fluids - offer nutritional supplements- elevate extremities- discussed with RN  Acute metabolic encephalopathy - Secondary to sepsis - Improving  Diarrhea - Related to COVID-19 infection but also may be due to Creon not being resumed - - stool is beginning to solidify- he is eating solid food quite well.  Orthostatic hypotension - ?  If related to Parkinson's disease - Patient is on Florinef and midodrine as which outpatient are being continued  Diabetes mellitus type 1 with DKA followed by hypoglycemia -DKA resolved-continue Semglee and NovoLog sliding scale -Hemoglobin A1C 5/2 -Home medication includes Lantus and Humalog- - Hypoglycemia occurred on 9/29> dropped Lantus dose of 4 U and reduced Humolog sliding scale- following oral intake and glucose levels  COPD exacerbation? -Per critical care notes, they felt the patient had a COPD exacerbation on admission - No wheezing noted at this time-  Hypokalemia - Likely secondary to diarrhea - Continue to replace aggressively - Magnesium 2.0  Normocytic anemia - Continue to follow  Pancreatic insufficiency - Resume Creon  Parkinson's disease - Continue Sinemet  Alzheimer's dementia - Continue Exelon  Severe protein calorie malnutrition - Continue dietary supplements  Severe deconditioning - cont to work with PT- extremely weak at this time.    Time spent in minutes: 35 DVT prophylaxis: heparin injection 5,000 Units Start: 01/17/2021 1600 SCDs Start: 01/27/2021 1549 Code Status: Full code Family Communication:  Level of Care: Level of care: Med-Surg Disposition Plan:  Status is: Inpatient  Remains inpatient appropriate because:IV treatments appropriate due to intensity of illness or inability to  take PO  Dispo: The patient is from: SNF              Anticipated d/c is to: SNF on 10/7 due to isolation period required for COVID 19              Patient currently is not  medically stable to d/c.   Difficult to place patient No      Consultants:  critical care service Procedures:   Antimicrobials:  Anti-infectives (From admission, onward)    Start     Dose/Rate Route Frequency Ordered Stop   02/04/21 1600  vancomycin (VANCOREADY) IVPB 750 mg/150 mL  Status:  Discontinued        750 mg 150 mL/hr over 60 Minutes Intravenous Every 24 hours 02/04/21 1112 02/05/21 1252   02/04/21 1000  remdesivir 100 mg in sodium chloride 0.9 % 100 mL IVPB       See Hyperspace for full Linked Orders Report.   100 mg 200 mL/hr over 30 Minutes Intravenous Daily 02/05/2021 1800 02/07/21 0937   02/04/21 0200  ceFEPIme (MAXIPIME) 2 g in sodium chloride 0.9 % 100 mL IVPB        2 g 200 mL/hr over 30 Minutes Intravenous Every 12 hours 01/15/2021 1558     02/07/2021 1845  remdesivir 200 mg in sodium chloride 0.9% 250 mL IVPB       See Hyperspace for full Linked Orders Report.   200 mg 580 mL/hr over 30 Minutes Intravenous Once 01/30/2021 1800 01/29/2021 2025   01/22/2021 1557  vancomycin variable dose per unstable renal function (pharmacist dosing)  Status:  Discontinued         Does not apply See admin instructions 01/24/2021 1558 02/04/21 1112   02/02/2021 1430  vancomycin (VANCOREADY) IVPB 1500 mg/300 mL        1,500 mg 150 mL/hr over 120 Minutes Intravenous  Once 02/05/2021 1403 01/15/2021 1641   01/27/2021 1415  ceFEPIme (MAXIPIME) 2 g in sodium chloride 0.9 % 100 mL IVPB        2 g 200 mL/hr over 30 Minutes Intravenous  Once 01/14/2021 1403 01/11/2021 1840   01/16/2021 1415  metroNIDAZOLE (FLAGYL) IVPB 500 mg        500 mg 100 mL/hr over 60 Minutes Intravenous  Once 01/14/2021 1403 01/30/2021 1938        Objective: Vitals:   02/06/21 1500 02/06/21 1900 02/07/21 0500 02/07/21 0700  BP: 136/88 120/73 (!) 141/99 (!) 146/77  Pulse: 94 78 67 64  Resp: 18 18 18 18   Temp: 98.2 F (36.8 C) 98 F (36.7 C) 97.7 F (36.5 C) 97.9 F (36.6 C)  TempSrc: Axillary Axillary Oral Axillary  SpO2: 94%  (!) 84% 98% 100%  Weight:      Height:        Intake/Output Summary (Last 24 hours) at 02/07/2021 1105 Last data filed at 02/07/2021 0500 Gross per 24 hour  Intake 2113.93 ml  Output 200 ml  Net 1913.93 ml    Filed Weights   02/04/2021 1438 01/15/2021 1715 02/04/21 0545  Weight: 65.1 kg 59.8 kg 61.9 kg    Examination: General exam: Appears comfortable  HEENT: PERRLA, oral mucosa moist, no sclera icterus or thrush Respiratory system: Clear to auscultation. Respiratory effort normal. Cardiovascular system: S1 & S2 heard, regular rate and rhythm Gastrointestinal system: Abdomen soft, non-tender, nondistended. Normal bowel sounds   Central nervous system: Alert and oriented. No focal neurological deficits. Extremities: No cyanosis, clubbing -  edema  present in all 4 extremities.  Skin: No rashes or ulcers Psychiatry:  Mood & affect appropriate.        Data Reviewed: I have personally reviewed following labs and imaging studies  CBC: Recent Labs  Lab 01/11/2021 1338 02/04/21 0130 02/05/21 0641 02/06/21 0640 02/07/21 0615  WBC 17.4* 9.9 6.9 7.8 9.2  NEUTROABS 13.6*  --   --   --   --   HGB 9.0* 8.1* 7.7* 8.3* 8.4*  HCT 31.0* 24.0* 22.0* 24.6* 25.9*  MCV 114.8* 97.2 98.7 98.4 99.6  PLT 491* 370 252 244 639    Basic Metabolic Panel: Recent Labs  Lab 02/02/2021 1442 01/16/2021 2032 02/04/21 0130 02/04/21 1011 02/04/21 1817 02/05/21 0641 02/06/21 0640 02/07/21 0615  NA 135   < > 137 138  --  137 139 139  K 4.1   < > 3.1* 3.4* 3.4* 2.9* 3.5 3.8  CL 101   < > 102 100  --  100 106 109  CO2 7*   < > 22 25  --  26 27 24   GLUCOSE 697*   < > 198* 110*  --  163* 159* 161*  BUN 24*   < > 25* 23  --  20 19 21   CREATININE 2.10*   < > 1.83* 1.82*  --  1.49* 1.16 1.14  CALCIUM 7.7*   < > 7.7* 7.8*  --  7.8* 7.7* 7.7*  MG 1.5*  --  1.8  --   --  2.0 1.7 2.0  PHOS 6.4*  --  3.0  --   --  2.5 2.8 2.5   < > = values in this interval not displayed.    GFR: Estimated Creatinine  Clearance: 55.8 mL/min (by C-G formula based on SCr of 1.14 mg/dL). Liver Function Tests: Recent Labs  Lab 01/10/2021 1338 02/04/21 0130 02/05/21 0641 02/06/21 0640 02/07/21 0615  AST 29 30 25 16  14*  ALT 5 8 9 11 12   ALKPHOS 105 79 65 70 73  BILITOT 1.9* 0.7 0.6 0.9 0.7  PROT 5.2* 4.6* 3.9* 4.1* 4.2*  ALBUMIN 2.5* 2.1* 1.8* 1.9* 1.8*    No results for input(s): LIPASE, AMYLASE in the last 168 hours. No results for input(s): AMMONIA in the last 168 hours. Coagulation Profile: Recent Labs  Lab 02/02/2021 1338  INR 1.3*    Cardiac Enzymes: No results for input(s): CKTOTAL, CKMB, CKMBINDEX, TROPONINI in the last 168 hours. BNP (last 3 results) No results for input(s): PROBNP in the last 8760 hours. HbA1C: No results for input(s): HGBA1C in the last 72 hours. CBG: Recent Labs  Lab 02/06/21 1627 02/06/21 2059 02/06/21 2318 02/07/21 0549 02/07/21 0758  GLUCAP 274* 159* 152* 147* 175*    Lipid Profile: No results for input(s): CHOL, HDL, LDLCALC, TRIG, CHOLHDL, LDLDIRECT in the last 72 hours. Thyroid Function Tests: No results for input(s): TSH, T4TOTAL, FREET4, T3FREE, THYROIDAB in the last 72 hours. Anemia Panel: Recent Labs    02/06/21 0640 02/07/21 0615  FERRITIN 490* 417*    Urine analysis:    Component Value Date/Time   COLORURINE YELLOW (A) 01/24/2021 1442   APPEARANCEUR CLOUDY (A) 01/10/2021 1442   LABSPEC 1.016 01/12/2021 1442   PHURINE 5.0 01/30/2021 1442   GLUCOSEU >=500 (A) 01/27/2021 1442   HGBUR SMALL (A) 01/15/2021 1442   BILIRUBINUR NEGATIVE 01/25/2021 1442   KETONESUR 5 (A) 01/27/2021 1442   PROTEINUR 30 (A) 01/31/2021 1442   NITRITE NEGATIVE 01/18/2021 1442   LEUKOCYTESUR NEGATIVE 02/06/2021  1442   Sepsis Labs: @LABRCNTIP (procalcitonin:4,lacticidven:4) ) Recent Results (from the past 240 hour(s))  Resp Panel by RT-PCR (Flu A&B, Covid) Nasopharyngeal Swab     Status: None   Collection Time: 01/28/21  1:00 PM   Specimen: Nasopharyngeal  Swab; Nasopharyngeal(NP) swabs in vial transport medium  Result Value Ref Range Status   SARS Coronavirus 2 by RT PCR NEGATIVE NEGATIVE Final    Comment: (NOTE) SARS-CoV-2 target nucleic acids are NOT DETECTED.  The SARS-CoV-2 RNA is generally detectable in upper respiratory specimens during the acute phase of infection. The lowest concentration of SARS-CoV-2 viral copies this assay can detect is 138 copies/mL. A negative result does not preclude SARS-Cov-2 infection and should not be used as the sole basis for treatment or other patient management decisions. A negative result may occur with  improper specimen collection/handling, submission of specimen other than nasopharyngeal swab, presence of viral mutation(s) within the areas targeted by this assay, and inadequate number of viral copies(<138 copies/mL). A negative result must be combined with clinical observations, patient history, and epidemiological information. The expected result is Negative.  Fact Sheet for Patients:  EntrepreneurPulse.com.au  Fact Sheet for Healthcare Providers:  IncredibleEmployment.be  This test is no t yet approved or cleared by the [place] FDA and  has been authorized for detection and/or diagnosis of SARS-CoV-2 by FDA under an Emergency Use Authorization (EUA). This EUA will remain  in effect (meaning this test can be used) for the duration of the COVID-19 declaration under Section 564(b)(1) of the Act, 21 U.S.C.section 360bbb-3(b)(1), unless the authorization is terminated  or revoked sooner.       Influenza A by PCR NEGATIVE NEGATIVE Final   Influenza B by PCR NEGATIVE NEGATIVE Final    Comment: (NOTE) The Xpert Xpress SARS-CoV-2/FLU/RSV plus assay is intended as an aid in the diagnosis of influenza from Nasopharyngeal swab specimens and should not be used as a sole basis for treatment. Nasal washings and aspirates are unacceptable for Xpert Xpress  SARS-CoV-2/FLU/RSV testing.  Fact Sheet for Patients: EntrepreneurPulse.com.au  Fact Sheet for Healthcare Providers: IncredibleEmployment.be  This test is not yet approved or cleared by the [place] FDA and has been authorized for detection and/or diagnosis of SARS-CoV-2 by FDA under an Emergency Use Authorization (EUA). This EUA will remain in effect (meaning this test can be used) for the duration of the COVID-19 declaration under Section 564(b)(1) of the Act, 21 U.S.C. section 360bbb-3(b)(1), unless the authorization is terminated or revoked.  Performed at [facility], [address] [place], [place] [address]   Resp Panel by RT-PCR (Flu A&B, Covid) Nasopharyngeal Swab     Status: Abnormal   Collection Time: 01/23/2021  2:42 PM   Specimen: Nasopharyngeal Swab; Nasopharyngeal(NP) swabs in vial transport medium  Result Value Ref Range Status   SARS Coronavirus 2 by RT PCR POSITIVE (A) NEGATIVE Final    Comment: RESULT CALLED TO, READ BACK BY AND VERIFIED WITH: SHANNON STARK @1744  01/18/2021 MJU (NOTE) SARS-CoV-2 target nucleic acids are DETECTED.  The SARS-CoV-2 RNA is generally detectable in upper respiratory specimens during the acute phase of infection. Positive results are indicative of the presence of the identified virus, but do not rule out bacterial infection or co-infection with other pathogens not detected by the test. Clinical correlation with patient history and other diagnostic information is necessary to determine patient infection status. The expected result is Negative.  Fact Sheet for Patients: EntrepreneurPulse.com.au  Fact Sheet for Healthcare Providers: IncredibleEmployment.be  This test  is not yet approved or cleared by the [place] and  has been authorized for detection and/or diagnosis of SARS-CoV-2 by FDA under an Emergency Use Authorization (EUA).  This  EUA will remain in effect (meaning this test can be u sed) for the duration of  the COVID-19 declaration under Section 564(b)(1) of the Act, 21 U.S.C. section 360bbb-3(b)(1), unless the authorization is terminated or revoked sooner.     Influenza A by PCR NEGATIVE NEGATIVE Final   Influenza B by PCR NEGATIVE NEGATIVE Final    Comment: (NOTE) The Xpert Xpress SARS-CoV-2/FLU/RSV plus assay is intended as an aid in the diagnosis of influenza from Nasopharyngeal swab specimens and should not be used as a sole basis for treatment. Nasal washings and aspirates are unacceptable for Xpert Xpress SARS-CoV-2/FLU/RSV testing.  Fact Sheet for Patients: EntrepreneurPulse.com.au  Fact Sheet for Healthcare Providers: IncredibleEmployment.be  This test is not yet approved or cleared by the [place] FDA and has been authorized for detection and/or diagnosis of SARS-CoV-2 by FDA under an Emergency Use Authorization (EUA). This EUA will remain in effect (meaning this test can be used) for the duration of the COVID-19 declaration under Section 564(b)(1) of the Act, 21 U.S.C. section 360bbb-3(b)(1), unless the authorization is terminated or revoked.  Performed at [facility], [address] [place], [place] [address]   Blood Culture (routine x 2)     Status: None (Preliminary result)   Collection Time: 01/23/2021  2:42 PM   Specimen: BLOOD  Result Value Ref Range Status   Specimen Description BLOOD RIGHT ANTECUBITAL  Final   Special Requests   Final    BOTTLES DRAWN AEROBIC AND ANAEROBIC Blood Culture adequate volume   Culture   Final    NO GROWTH 4 DAYS Performed at [facility], [address] [place], [place] [address]    Report Status PENDING  Incomplete  Blood Culture (routine x 2)     Status: None (Preliminary result)   Collection Time: 02/06/2021  2:42 PM   Specimen: BLOOD  Result Value Ref Range Status   Specimen  Description BLOOD BLOOD RIGHT HAND  Final   Special Requests   Final    BOTTLES DRAWN AEROBIC AND ANAEROBIC Blood Culture adequate volume   Culture   Final    NO GROWTH 4 DAYS Performed at [facility], [address] [place], [place] [address]    Report Status PENDING  Incomplete  Urine Culture     Status: None   Collection Time: 02/07/2021  2:42 PM   Specimen: In/Out Cath Urine  Result Value Ref Range Status   Specimen Description   Final    IN/OUT CATH URINE Performed at [facility], [address] [place], [place] [address]    Special Requests   Final    NONE Performed at [facility], [address], [place], [place] [address]    Culture   Final    NO GROWTH Performed at [facility], [place] [address] [place], [place] [address]    Report Status 02/04/2021 FINAL  Final  MRSA Next Gen by PCR, Nasal     Status: Abnormal   Collection Time: 01/16/2021  5:17 PM   Specimen: Nasal Mucosa; Nasal Swab  Result Value Ref Range Status   MRSA by PCR Next Gen DETECTED (A) NOT DETECTED Final    Comment: RESULT CALLED TO, READ BACK BY AND VERIFIED WITH: SHANNON STARK @1855  ON 01/29/2021 SKL (NOTE) The  GeneXpert MRSA Assay (FDA approved for NASAL specimens only), is one component of a comprehensive MRSA colonization surveillance program. It is not intended to diagnose MRSA infection nor to guide or monitor treatment for MRSA infections. Test performance is not FDA approved in patients less than 8 years old. Performed at [facility], [place], [place] [address]   C Difficile Quick Screen w PCR reflex     Status: Abnormal   Collection Time: 01/16/2021 11:15 PM   Specimen: STOOL  Result Value Ref Range Status   C Diff antigen POSITIVE (A) NEGATIVE Final   C Diff toxin NEGATIVE NEGATIVE Final   C Diff interpretation Results are indeterminate. See PCR results.  Final    Comment: Performed at [facility], [address] [place], [place] [address]  C. Diff by PCR, Reflexed     Status: Abnormal   Collection Time: 01/17/2021 11:15 PM  Result Value Ref Range Status   Toxigenic C. Difficile by PCR POSITIVE (A) NEGATIVE Final    Comment: Positive for toxigenic C. difficile with little to no toxin production. Only treat if clinical presentation suggests symptomatic illness. Performed at [facility], [address], [place], [place] [address]          Radiology Studies: No results found.    Scheduled Meds:  vitamin C  500 mg Oral Daily   bismuth subsalicylate  30 mL Oral QID   Chlorhexidine Gluconate Cloth  6 each Topical Q0600   feeding supplement  237 mL Oral TID BM   fludrocortisone  200 mcg Oral Daily   folic acid  1 mg Oral Daily   heparin  5,000 Units Subcutaneous Q8H   insulin aspart  0-6 Units Subcutaneous TID WC   insulin glargine-yfgn  4 Units Subcutaneous Daily   midodrine  5 mg Oral TID WC   multivitamin with minerals  1 tablet Oral Daily   pantoprazole  40 mg Oral Daily   rivastigmine  1.5 mg Oral BID   thiamine  100 mg Oral Daily   zinc sulfate  220 mg Oral Daily   Continuous Infusions:  0.9 % NaCl with KCl 40 mEq / L 75 mL/hr at 02/07/21 0618   ceFEPime (MAXIPIME) IV 2 g (02/06/21 2259)     LOS: 4 days      Debbe Odea, MD [facility] Pager: [email or web address] 02/07/2021, 11:05 AM

## 2021-02-07 NOTE — Consult Note (Signed)
Pharmacy Antibiotic Note  John Reilly is a 65 y.o. male admitted on 01/25/2021 with sepsis - worsening mental status, hypoxyia and hyperglycemia.  Pt recently treated for PNA as inpatient - discharged 9/20.    Pharmacy has been consulted for Cefepime dosing.  Plan: Day 4 of full days of abx. Continue cefepime 2g q12h.   Height: 5\' 10"  (177.8 cm) Weight: 61.9 kg (136 lb 7.4 oz) IBW/kg (Calculated) : 73  Temp (24hrs), Avg:98 F (36.7 C), Min:97.7 F (36.5 C), Max:98.2 F (36.8 C)  Recent Labs  Lab 02/01/2021 1338 01/15/2021 1442 01/18/2021 1633 02/01/2021 2032 02/04/21 0130 02/04/21 0515 02/04/21 1011 02/05/21 0641 02/06/21 0640 02/07/21 0615  WBC 17.4*  --   --   --  9.9  --   --  6.9 7.8 9.2  CREATININE 2.10*   < >  --  1.92* 1.83*  --  1.82* 1.49* 1.16  --   LATICACIDVEN 6.8*  --  4.7*  --   --  4.2* 1.8  --   --   --    < > = values in this interval not displayed.     Estimated Creatinine Clearance: 54.8 mL/min (by C-G formula based on SCr of 1.16 mg/dL).    Allergies  Allergen Reactions   Penicillins Rash and Other (See Comments)    Has patient had a PCN reaction causing immediate rash, facial/tongue/throat swelling, SOB or lightheadedness with hypotension: Yes Has patient had a PCN reaction causing severe rash involving mucus membranes or skin necrosis: No Has patient had a PCN reaction that required hospitalization: No Has patient had a PCN reaction occurring within the last 10 years: No If all of the above answers are "NO", then may proceed with Cephalosporin use. Other reaction(s): RASH Has patient had a PCN reaction causing immediate rash, facial/tongue/throat swelling, SOB or lightheadedness with hypotension: Yes Has patient had a PCN reaction causing severe rash involving mucus membranes or skin necrosis: No Has patient had a PCN reaction that required hospitalization: No Has patient had a PCN reaction occurring within the last 10 years: No If all of the above  answers are "NO", then may proceed with Cephalosporin use. Reaction:  Unknown; childhood reaction  Has patient had a PCN reaction causing immediate rash, facial/tongue/throat swelling, SOB or lightheadedness with hypotension: Yes Has patient had a PCN reaction causing severe rash involving mucus membranes or skin necrosis: No Has patient had a PCN reaction that required hospitalization: No Has patient had a PCN reaction occurring within the last 10 years: No If all of the above answers are "NO", then may proceed with Cephalosporin use.   Sulfa Antibiotics Rash    Katherina Right Syndrome (SJS)     Antimicrobials this admission: Cefepime 9/26 >> Vancomcyin 9/26 >>  Dose adjustments this admission: None  Microbiology results: 9/26 Bcx pending 9/26 Ucx pending  Thank you for allowing pharmacy to be a part of this patient's care.  Oswald Hillock, PharmD, BCPS Clinical Pharmacist 02/07/2021 7:39 AM

## 2021-02-08 ENCOUNTER — Inpatient Hospital Stay: Payer: Medicare Other

## 2021-02-08 DIAGNOSIS — E1011 Type 1 diabetes mellitus with ketoacidosis with coma: Secondary | ICD-10-CM | POA: Diagnosis not present

## 2021-02-08 DIAGNOSIS — U071 COVID-19: Secondary | ICD-10-CM | POA: Diagnosis not present

## 2021-02-08 DIAGNOSIS — N179 Acute kidney failure, unspecified: Secondary | ICD-10-CM | POA: Diagnosis not present

## 2021-02-08 DIAGNOSIS — A419 Sepsis, unspecified organism: Secondary | ICD-10-CM | POA: Diagnosis not present

## 2021-02-08 LAB — CULTURE, BLOOD (ROUTINE X 2)
Culture: NO GROWTH
Culture: NO GROWTH
Special Requests: ADEQUATE
Special Requests: ADEQUATE

## 2021-02-08 LAB — BLOOD GAS, ARTERIAL
Acid-base deficit: 7 mmol/L — ABNORMAL HIGH (ref 0.0–2.0)
Allens test (pass/fail): POSITIVE — AB
Bicarbonate: 11.3 mmol/L — ABNORMAL LOW (ref 20.0–28.0)
FIO2: 0.21
O2 Saturation: 99.8 %
Patient temperature: 37
pCO2 arterial: <19 mmHg — CL (ref 32.0–48.0)
pH, Arterial: 7.58 — ABNORMAL HIGH (ref 7.350–7.450)
pO2, Arterial: 186 mmHg — ABNORMAL HIGH (ref 83.0–108.0)

## 2021-02-08 LAB — COMPREHENSIVE METABOLIC PANEL
ALT: 12 U/L (ref 0–44)
AST: 12 U/L — ABNORMAL LOW (ref 15–41)
Albumin: 1.7 g/dL — ABNORMAL LOW (ref 3.5–5.0)
Alkaline Phosphatase: 68 U/L (ref 38–126)
Anion gap: 9 (ref 5–15)
BUN: 18 mg/dL (ref 8–23)
CO2: 22 mmol/L (ref 22–32)
Calcium: 7.8 mg/dL — ABNORMAL LOW (ref 8.9–10.3)
Chloride: 108 mmol/L (ref 98–111)
Creatinine, Ser: 1.25 mg/dL — ABNORMAL HIGH (ref 0.61–1.24)
GFR, Estimated: >60 mL/min (ref >60)
Glucose, Bld: 208 mg/dL — ABNORMAL HIGH (ref 70–99)
Potassium: 3.3 mmol/L — ABNORMAL LOW (ref 3.5–5.1)
Sodium: 139 mmol/L (ref 135–145)
Total Bilirubin: 1 mg/dL (ref 0.3–1.2)
Total Protein: 3.9 g/dL — ABNORMAL LOW (ref 6.5–8.1)

## 2021-02-08 LAB — PHOSPHORUS: Phosphorus: 2.3 mg/dL — ABNORMAL LOW (ref 2.5–4.6)

## 2021-02-08 LAB — CBC
HCT: 23.3 % — ABNORMAL LOW (ref 39.0–52.0)
Hemoglobin: 7.8 g/dL — ABNORMAL LOW (ref 13.0–17.0)
MCH: 33.5 pg (ref 26.0–34.0)
MCHC: 33.5 g/dL (ref 30.0–36.0)
MCV: 100 fL (ref 80.0–100.0)
Platelets: 228 10*3/uL (ref 150–400)
RBC: 2.33 MIL/uL — ABNORMAL LOW (ref 4.22–5.81)
RDW: 14.1 % (ref 11.5–15.5)
WBC: 6.4 10*3/uL (ref 4.0–10.5)
nRBC: 0 % (ref 0.0–0.2)

## 2021-02-08 LAB — GLUCOSE, CAPILLARY
Glucose-Capillary: 214 mg/dL — ABNORMAL HIGH (ref 70–99)
Glucose-Capillary: 257 mg/dL — ABNORMAL HIGH (ref 70–99)
Glucose-Capillary: 198 mg/dL — ABNORMAL HIGH (ref 70–99)
Glucose-Capillary: 201 mg/dL — ABNORMAL HIGH (ref 70–99)
Glucose-Capillary: 139 mg/dL — ABNORMAL HIGH (ref 70–99)

## 2021-02-08 LAB — D-DIMER, QUANTITATIVE: D-Dimer, Quant: 0.6 ug/mL-FEU — ABNORMAL HIGH (ref 0.00–0.50)

## 2021-02-08 LAB — MAGNESIUM: Magnesium: 1.7 mg/dL (ref 1.7–2.4)

## 2021-02-08 LAB — FERRITIN: Ferritin: 305 ng/mL (ref 24–336)

## 2021-02-08 LAB — C-REACTIVE PROTEIN: CRP: 1.5 mg/dL — ABNORMAL HIGH (ref <1.0)

## 2021-02-08 MED ORDER — POTASSIUM CHLORIDE CRYS ER 20 MEQ PO TBCR
40.0000 meq | EXTENDED_RELEASE_TABLET | Freq: Once | ORAL | Status: AC
  Administered 2021-02-08: 40 meq via ORAL
  Filled 2021-02-08: qty 2

## 2021-02-08 MED ORDER — PANCRELIPASE (LIP-PROT-AMYL) 12000-38000 UNITS PO CPEP
36000.0000 [IU] | ORAL_CAPSULE | Freq: Three times a day (TID) | ORAL | Status: DC
  Administered 2021-02-08 – 2021-02-12 (×11): 36000 [IU] via ORAL
  Filled 2021-02-08 (×14): qty 1

## 2021-02-08 MED ORDER — POTASSIUM CHLORIDE 20 MEQ PO PACK
40.0000 meq | PACK | Freq: Once | ORAL | Status: AC
  Administered 2021-02-08: 40 meq via ORAL
  Filled 2021-02-08: qty 2

## 2021-02-08 MED ORDER — SODIUM CHLORIDE 0.9 % IV SOLN: INTRAVENOUS | Status: DC

## 2021-02-08 MED ORDER — MAGNESIUM SULFATE 2 GM/50ML IV SOLN
2.0000 g | Freq: Once | INTRAVENOUS | Status: AC
  Administered 2021-02-08: 2 g via INTRAVENOUS
  Filled 2021-02-08: qty 50

## 2021-02-08 MED ORDER — K PHOS MONO-SOD PHOS DI & MONO 155-852-130 MG PO TABS
500.0000 mg | ORAL_TABLET | ORAL | Status: AC
Start: 2021-02-08 — End: 2021-02-08
  Administered 2021-02-08: 500 mg via ORAL
  Filled 2021-02-08 (×2): qty 2

## 2021-02-08 NOTE — Progress Notes (Addendum)
PROGRESS NOTE    John Reilly   FFM:[id number]  DOB: 05-24-1955  DOA: 01/19/2021 PCP: Adin Hector, MD   Brief Narrative:  John Reilly is a 65 year old male with type 1 diabetes, COPD, hypertension, alcohol abuse, Parkinson's disease, hyperlipidemia who presented from a skilled nursing facility to the ED on 9/26 with confusion.    In the ED, noted to be in DKA and septic shock secondary to suspected pneumonia.  Admitted to the ICU, started on broad-spectrum antibiotics, Levophed and insulin infusions. His condition improved to be transferred out of the ICU and to the triad hospitalist service today.  Patient was recently hospitalized from 9/12 through 9/20 and he was found hypoglycemic and unresponsive by his wife.  In the hospital he was treated for sepsis secondary to aspiration pneumonia.  He was discharged to skilled nursing facility.  Subjective: His right arm was noted to be weak this AM. He also has a noticeable right facial droop. He states he does not notice any change.     Assessment & Plan:    Principal problem: Septic shock secondary to COVID-19 related pneumonia and possibly aspiration pneumonia -Chest x-ray on 9/26 revealed worsening opacity in the left base and persistent hazy opacities in the right lower lobe - Being treated with Remdesivir and Cefepime -blood cultures have remained negative- vancomycin discontinued -Of note, C. difficile PCR is positive and antigen is positive but toxin is negative-he continues to have watery stools but these may be secondary to the COVID-19 infection and therefore I will hold off on treating for C. difficile colitis   Active Problems:  Right sided weakness and right facial droop - stat MRI ordered this AM - per RN, he is coughing when drinking fluids- of note, the patient has had a congested cough for many days- at this point we will place him on nectar thick liquids and obtain an SLP eval.  Addendum: MRI negative for  CVA- s/p SLP eval-> mechanical soft diet and thin liquids ordered  AKI, anion gap metabolic acidosis, hyponatremia - Baseline creatinine is about 0.8 2.9 - Admitted with creatinine of 2.10 which has steadily improved to normal - Urine output has been poor as patient is third spacing fluids - I stopped the IVF on 9/30- we are encouraging him to eat and drink - Cr up to 1.25 today- continue to follow  Hypokalemia -will replace  Hypoalbuminemia with third spacing of fluids - offer nutritional supplements- elevate extremities- discussed with RN  Acute metabolic encephalopathy - Secondary to sepsis - Improving  Diarrhea - Related to COVID-19 infection but also may be due to Creon not being resumed - - stool is beginning to solidify- he is eating solid food quite well.  Orthostatic hypotension - ?  If related to Parkinson's disease - Patient is on Florinef and midodrine as which outpatient are being continued  Diabetes mellitus type 1 with DKA followed by hypoglycemia -DKA resolved-continue Semglee and NovoLog sliding scale -Hemoglobin A1C 5/2 -Home medication includes Lantus and Humalog- - Hypoglycemia occurred on 9/29> dropped Lantus dose of 4 U and reduced Humolog sliding scale- following oral intake and glucose levels  COPD exacerbation? -Per critical care notes, they felt the patient had a COPD exacerbation on admission - No wheezing noted at this time-  Hypokalemia - Likely secondary to diarrhea - Continue to replace aggressively - Magnesium 2.0  Normocytic anemia - Continue to follow  Pancreatic insufficiency - Resume Creon  Parkinson's disease - Continue Sinemet  Alzheimer's dementia - Continue Exelon- overall he is very oriented to time place and situation  Severe protein calorie malnutrition - Continue dietary supplements  Severe deconditioning - cont to work with PT- extremely weak at this time.    Time spent in minutes: 35 DVT prophylaxis: heparin  injection 5,000 Units Start: 01/30/2021 1600 SCDs Start: 01/30/2021 1549 Code Status: Full code Family Communication:  Level of Care: Level of care: Progressive Cardiac Disposition Plan:  Status is: Inpatient  Remains inpatient appropriate because:IV treatments appropriate due to intensity of illness or inability to take PO  Dispo: The patient is from: SNF              Anticipated d/c is to: SNF on 10/7 due to isolation period required for COVID 19              Patient currently is not medically stable to d/c.   Difficult to place patient No   Consultants:  critical care service Procedures:   Antimicrobials:  Anti-infectives (From admission, onward)    Start     Dose/Rate Route Frequency Ordered Stop   02/07/21 2200  ceFEPIme (MAXIPIME) 2 g in sodium chloride 0.9 % 100 mL IVPB        2 g 200 mL/hr over 30 Minutes Intravenous Every 12 hours 02/07/21 1432 02/08/21 1052   02/04/21 1600  vancomycin (VANCOREADY) IVPB 750 mg/150 mL  Status:  Discontinued        750 mg 150 mL/hr over 60 Minutes Intravenous Every 24 hours 02/04/21 1112 02/05/21 1252   02/04/21 1000  remdesivir 100 mg in sodium chloride 0.9 % 100 mL IVPB       See Hyperspace for full Linked Orders Report.   100 mg 200 mL/hr over 30 Minutes Intravenous Daily 02/02/2021 1800 02/07/21 0937   02/04/21 0200  ceFEPIme (MAXIPIME) 2 g in sodium chloride 0.9 % 100 mL IVPB  Status:  Discontinued        2 g 200 mL/hr over 30 Minutes Intravenous Every 12 hours 01/12/2021 1558 02/07/21 1432   01/11/2021 1845  remdesivir 200 mg in sodium chloride 0.9% 250 mL IVPB       See Hyperspace for full Linked Orders Report.   200 mg 580 mL/hr over 30 Minutes Intravenous Once 02/02/2021 1800 02/07/2021 2025   01/16/2021 1557  vancomycin variable dose per unstable renal function (pharmacist dosing)  Status:  Discontinued         Does not apply See admin instructions 01/10/2021 1558 02/04/21 1112   01/16/2021 1430  vancomycin (VANCOREADY) IVPB 1500 mg/300 mL         1,500 mg 150 mL/hr over 120 Minutes Intravenous  Once 01/10/2021 1403 01/20/2021 1641   02/06/2021 1415  ceFEPIme (MAXIPIME) 2 g in sodium chloride 0.9 % 100 mL IVPB        2 g 200 mL/hr over 30 Minutes Intravenous  Once 02/04/2021 1403 01/21/2021 1840   01/29/2021 1415  metroNIDAZOLE (FLAGYL) IVPB 500 mg        500 mg 100 mL/hr over 60 Minutes Intravenous  Once 01/21/2021 1403 02/01/2021 1938        Objective: Vitals:   02/07/21 1500 02/07/21 2100 02/08/21 0654 02/08/21 0815  BP: 140/72 (!) 128/56 (!) 138/56 (!) 112/57  Pulse: 61 (!) 59 65 (!) 104  Resp: 18     Temp: 98.1 F (36.7 C) 97.8 F (36.6 C) 97.6 F (36.4 C) 97.7 F (36.5 C)  TempSrc: Axillary Oral Oral Oral  SpO2: 100%  100%   Weight:      Height:        Intake/Output Summary (Last 24 hours) at 02/08/2021 1349 Last data filed at 02/07/2021 2230 Gross per 24 hour  Intake --  Output 750 ml  Net -750 ml    Filed Weights   01/22/2021 1438 01/25/2021 1715 02/04/21 0545  Weight: 65.1 kg 59.8 kg 61.9 kg    Examination: General exam: Appears comfortable  HEENT: PERRLA, oral mucosa moist, no sclera icterus or thrush Respiratory system: Clear to auscultation. Respiratory effort normal. Cardiovascular system: S1 & S2 heard, regular rate and rhythm Gastrointestinal system: Abdomen soft, non-tender, nondistended. Normal bowel sounds   Central nervous system: Alert and oriented x 3. Right arm 4/5 in strength- weaker than yesterday, right facial droop noted-  Extremities: No cyanosis, clubbing or edema- 2 + pitting edema in bilateral upper extermities Skin: No rashes or ulcers Psychiatry:  Mood & affect appropriate.        Data Reviewed: I have personally reviewed following labs and imaging studies  CBC: Recent Labs  Lab 02/05/2021 1338 02/04/21 0130 02/05/21 0641 02/06/21 0640 02/07/21 0615 02/08/21 0650  WBC 17.4* 9.9 6.9 7.8 9.2 6.4  NEUTROABS 13.6*  --   --   --   --   --   HGB 9.0* 8.1* 7.7* 8.3* 8.4* 7.8*  HCT  31.0* 24.0* 22.0* 24.6* 25.9* 23.3*  MCV 114.8* 97.2 98.7 98.4 99.6 100.0  PLT 491* 370 252 244 263 599    Basic Metabolic Panel: Recent Labs  Lab 02/04/21 0130 02/04/21 1011 02/04/21 1817 02/05/21 0641 02/06/21 0640 02/07/21 0615 02/08/21 0650  NA 137 138  --  137 139 139 139  K 3.1* 3.4* 3.4* 2.9* 3.5 3.8 3.3*  CL 102 100  --  100 106 109 108  CO2 22 25  --  26 27 24 22   GLUCOSE 198* 110*  --  163* 159* 161* 208*  BUN 25* 23  --  20 19 21 18   CREATININE 1.83* 1.82*  --  1.49* 1.16 1.14 1.25*  CALCIUM 7.7* 7.8*  --  7.8* 7.7* 7.7* 7.8*  MG 1.8  --   --  2.0 1.7 2.0 1.7  PHOS 3.0  --   --  2.5 2.8 2.5 2.3*    GFR: Estimated Creatinine Clearance: 50.9 mL/min (A) (by C-G formula based on SCr of 1.25 mg/dL (H)). Liver Function Tests: Recent Labs  Lab 02/04/21 0130 02/05/21 0641 02/06/21 0640 02/07/21 0615 02/08/21 0650  AST 30 25 16  14* 12*  ALT 8 9 11 12 12   ALKPHOS 79 65 70 73 68  BILITOT 0.7 0.6 0.9 0.7 1.0  PROT 4.6* 3.9* 4.1* 4.2* 3.9*  ALBUMIN 2.1* 1.8* 1.9* 1.8* 1.7*    No results for input(s): LIPASE, AMYLASE in the last 168 hours. No results for input(s): AMMONIA in the last 168 hours. Coagulation Profile: Recent Labs  Lab 01/20/2021 1338  INR 1.3*    Cardiac Enzymes: No results for input(s): CKTOTAL, CKMB, CKMBINDEX, TROPONINI in the last 168 hours. BNP (last 3 results) No results for input(s): PROBNP in the last 8760 hours. HbA1C: No results for input(s): HGBA1C in the last 72 hours. CBG: Recent Labs  Lab 02/07/21 1704 02/07/21 2134 02/08/21 0759 02/08/21 1042 02/08/21 1313  GLUCAP 158* 93 214* 257* 198*    Lipid Profile: No results for input(s): CHOL, HDL, LDLCALC, TRIG, CHOLHDL, LDLDIRECT in the last 72 hours. Thyroid Function Tests: No results for  input(s): TSH, T4TOTAL, FREET4, T3FREE, THYROIDAB in the last 72 hours. Anemia Panel: Recent Labs    02/07/21 0615 02/08/21 0650  FERRITIN 417* 305    Urine analysis:    Component  Value Date/Time   COLORURINE YELLOW (A) 01/17/2021 1442   APPEARANCEUR CLOUDY (A) 01/13/2021 1442   LABSPEC 1.016 01/12/2021 1442   PHURINE 5.0 01/13/2021 1442   GLUCOSEU >=500 (A) 01/19/2021 1442   HGBUR SMALL (A) 01/26/2021 1442   BILIRUBINUR NEGATIVE 01/23/2021 1442   KETONESUR 5 (A) 02/07/2021 1442   PROTEINUR 30 (A) 01/16/2021 1442   NITRITE NEGATIVE 01/09/2021 1442   LEUKOCYTESUR NEGATIVE 01/17/2021 1442   Sepsis Labs: @LABRCNTIP (procalcitonin:4,lacticidven:4) ) Recent Results (from the past 240 hour(s))  Resp Panel by RT-PCR (Flu A&B, Covid) Nasopharyngeal Swab     Status: Abnormal   Collection Time: 02/02/2021  2:42 PM   Specimen: Nasopharyngeal Swab; Nasopharyngeal(NP) swabs in vial transport medium  Result Value Ref Range Status   SARS Coronavirus 2 by RT PCR POSITIVE (A) NEGATIVE Final    Comment: RESULT CALLED TO, READ BACK BY AND VERIFIED WITH: SHANNON STARK @1744  02/02/2021 MJU (NOTE) SARS-CoV-2 target nucleic acids are DETECTED.  The SARS-CoV-2 RNA is generally detectable in upper respiratory specimens during the acute phase of infection. Positive results are indicative of the presence of the identified virus, but do not rule out bacterial infection or co-infection with other pathogens not detected by the test. Clinical correlation with patient history and other diagnostic information is necessary to determine patient infection status. The expected result is Negative.  Fact Sheet for Patients: EntrepreneurPulse.com.au  Fact Sheet for Healthcare Providers: IncredibleEmployment.be  This test is not yet approved or cleared by the [place] FDA and  has been authorized for detection and/or diagnosis of SARS-CoV-2 by FDA under an Emergency Use Authorization (EUA).  This EUA will remain in effect (meaning this test can be u sed) for the duration of  the COVID-19 declaration under Section 564(b)(1) of the Act, 21 U.S.C. section  360bbb-3(b)(1), unless the authorization is terminated or revoked sooner.     Influenza A by PCR NEGATIVE NEGATIVE Final   Influenza B by PCR NEGATIVE NEGATIVE Final    Comment: (NOTE) The Xpert Xpress SARS-CoV-2/FLU/RSV plus assay is intended as an aid in the diagnosis of influenza from Nasopharyngeal swab specimens and should not be used as a sole basis for treatment. Nasal washings and aspirates are unacceptable for Xpert Xpress SARS-CoV-2/FLU/RSV testing.  Fact Sheet for Patients: EntrepreneurPulse.com.au  Fact Sheet for Healthcare Providers: IncredibleEmployment.be  This test is not yet approved or cleared by the [place] FDA and has been authorized for detection and/or diagnosis of SARS-CoV-2 by FDA under an Emergency Use Authorization (EUA). This EUA will remain in effect (meaning this test can be used) for the duration of the COVID-19 declaration under Section 564(b)(1) of the Act, 21 U.S.C. section 360bbb-3(b)(1), unless the authorization is terminated or revoked.  Performed at [facility], [address] [place], [place] [address]   Blood Culture (routine x 2)     Status: None   Collection Time: 01/29/2021  2:42 PM   Specimen: BLOOD  Result Value Ref Range Status   Specimen Description BLOOD RIGHT ANTECUBITAL  Final   Special Requests   Final    BOTTLES DRAWN AEROBIC AND ANAEROBIC Blood Culture adequate volume   Culture   Final    NO GROWTH 5 DAYS Performed at [facility], [address], [place],  [address]  Report Status 02/08/2021 FINAL  Final  Blood Culture (routine x 2)     Status: None   Collection Time: 01/31/2021  2:42 PM   Specimen: BLOOD  Result Value Ref Range Status   Specimen Description BLOOD BLOOD RIGHT HAND  Final   Special Requests   Final    BOTTLES DRAWN AEROBIC AND ANAEROBIC Blood Culture adequate volume   Culture   Final    NO GROWTH 5 DAYS Performed at [facility], [address] [place], [place] [address]    Report Status 02/08/2021 FINAL  Final  Urine Culture     Status: None   Collection Time: 01/26/2021  2:42 PM   Specimen: In/Out Cath Urine  Result Value Ref Range Status   Specimen Description   Final    IN/OUT CATH URINE Performed at [facility], [address] [place], [place] [address]    Special Requests   Final    NONE Performed at [facility], [address], [place], [place] [address]    Culture   Final    NO GROWTH Performed at [facility], [place] [address], [place], [place] [address]    Report Status 02/04/2021 FINAL  Final  MRSA Next Gen by PCR, Nasal     Status: Abnormal   Collection Time: 01/30/2021  5:17 PM   Specimen: Nasal Mucosa; Nasal Swab  Result Value Ref Range Status   MRSA by PCR Next Gen DETECTED (A) NOT DETECTED Final    Comment: RESULT CALLED TO, READ BACK BY AND VERIFIED WITH: SHANNON STARK @1855  ON 01/19/2021 SKL (NOTE) The GeneXpert MRSA Assay (FDA approved for NASAL specimens only), is one component of a comprehensive MRSA colonization surveillance program. It is not intended to diagnose MRSA infection nor to guide or monitor treatment for MRSA infections. Test performance is not FDA approved in patients less than 1 years old. Performed at [facility], [place], [place] [address]   C Difficile Quick Screen w PCR reflex     Status: Abnormal   Collection Time: 01/19/2021 11:15 PM   Specimen: STOOL  Result Value Ref Range Status   C Diff antigen POSITIVE (A) NEGATIVE Final   C Diff toxin NEGATIVE NEGATIVE Final   C Diff interpretation Results are indeterminate. See PCR results.  Final    Comment: Performed at [facility], [address] [place], [place] [address]  C. Diff by PCR, Reflexed     Status: Abnormal   Collection Time: 01/16/2021 11:15 PM  Result Value Ref Range Status   Toxigenic C. Difficile by PCR POSITIVE (A) NEGATIVE  Final    Comment: Positive for toxigenic C. difficile with little to no toxin production. Only treat if clinical presentation suggests symptomatic illness. Performed at [facility], [address] [place], [place] [address]          Radiology Studies: MR BRAIN WO CONTRAST  Result Date: 02/08/2021 CLINICAL DATA:  Neuro deficit, acute, stroke suspected right arm weakness, right facial droop. EXAM: MRI HEAD WITHOUT CONTRAST TECHNIQUE: Multiplanar, multiecho pulse sequences of the brain and surrounding structures were obtained without intravenous contrast. COMPARISON:  Head CT 01/20/2021 and MRI 12/16/2016 FINDINGS: Brain: There is no evidence of an acute infarct, intracranial hemorrhage, mass, midline shift, or extra-axial fluid collection. T2 hyperintensities in the cerebral white matter bilaterally have likely slightly progressed from 2018 and are nonspecific but compatible with mild chronic small vessel ischemic disease. There is mild  to moderate generalized cerebral atrophy. Vascular: Major intracranial vascular flow voids are preserved. Skull and upper cervical spine: Unremarkable bone marrow signal para Sinuses/Orbits: Unremarkable orbits. Clear paranasal sinuses. Small bilateral mastoid effusions. Other: None. IMPRESSION: 1. No acute intracranial abnormality. 2. Mild chronic small vessel ischemic disease and cerebral atrophy. Electronically Signed   By: Logan Bores M.D.   On: 02/08/2021 13:31      Scheduled Meds:  vitamin C  500 mg Oral Daily   bismuth subsalicylate  30 mL Oral QID   Chlorhexidine Gluconate Cloth  6 each Topical Q0600   feeding supplement  237 mL Oral TID BM   fludrocortisone  200 mcg Oral Daily   folic acid  1 mg Oral Daily   heparin  5,000 Units Subcutaneous Q8H   insulin aspart  0-6 Units Subcutaneous TID WC   insulin glargine-yfgn  4 Units Subcutaneous Daily   midodrine  5 mg Oral TID WC   multivitamin with minerals  1 tablet Oral Daily    pantoprazole  40 mg Oral Daily   phosphorus  500 mg Oral Q4H   rivastigmine  1.5 mg Oral BID   thiamine  100 mg Oral Daily   zinc sulfate  220 mg Oral Daily   Continuous Infusions:  sodium chloride 100 mL/hr at 02/08/21 1344     LOS: 5 days      Debbe Odea, MD [facility] Pager: [email or web address] 02/08/2021, 1:49 PM

## 2021-02-08 NOTE — Progress Notes (Signed)
Responded to Code Stroke. Pt unavailable. Spiritual support available as needed per on call chaplain.

## 2021-02-08 NOTE — Consult Note (Signed)
[facility] for Electrolyte Monitoring and Replacement   Recent Labs: Potassium (mmol/L)  Date Value  02/08/2021 3.3 (L)   Magnesium (mg/dL)  Date Value  02/08/2021 1.7   Calcium (mg/dL)  Date Value  02/08/2021 7.8 (L)   Albumin (g/dL)  Date Value  02/08/2021 1.7 (L)   Phosphorus (mg/dL)  Date Value  02/08/2021 2.3 (L)   Sodium (mmol/L)  Date Value  02/08/2021 139   Corrected Ca: 9.4 mg/dL  Assessment: 65 y.o. male with a past medical history of type 1 diabetes, COPD, hypertension, and hyperlipidemia who presents with altered mental status and DKA now transitioned to SSI + glargine.  Goal of Therapy:  Electrolytes WNL  Plan:  0.9% saline w/ 40 mEq KCl/L at 75 mL/hr - stopped Will give Kcl 40 mEq x 1 and Kphos tabs 2 tabs x 2 and Mg 2 g IV x 1 Recheck electrolytes with am labs   Oswald Hillock, PharmD, BCPS Clinical Pharmacist 02/08/2021 8:37 AM

## 2021-02-08 NOTE — Evaluation (Signed)
Clinical/Bedside Swallow Evaluation Patient Details  Name: John Reilly MRN: [id number] Date of Birth: Oct 28, 1955  Today's Date: 02/08/2021 Time: SLP Start Time (ACUTE ONLY): 76 SLP Stop Time (ACUTE ONLY): 2683 SLP Time Calculation (min) (ACUTE ONLY): 60 min  Past Medical History:  Past Medical History:  Diagnosis Date   Cancer ([place])    Pt reports on 10/02/17 that he has never been dx with cancer   Colon polyps    COPD (chronic obstructive pulmonary disease) ([place])    Diabetes mellitus without complication ([place])    type 1   GERD (gastroesophageal reflux disease)    Hip fracture ([place])    History of malignant neoplasm of colon    Hypertension    Multiple duodenal ulcers    Parkinson disease ([place])    Peripheral vascular disease ([place])    Peripheral Neuropathy   Retinopathy    T12 compression fracture ([place]) 11/2020   Past Surgical History:  Past Surgical History:  Procedure Laterality Date   ANKLE ARTHROPLASTY Right 12/2011   [place], 2002, 2006, 2009, 2013, 2018   COLONOSCOPY WITH PROPOFOL N/A 10/12/2016   Procedure: COLONOSCOPY WITH PROPOFOL;  Surgeon: Manya Silvas, MD;  Location: [facility] ENDOSCOPY;  Service: Endoscopy;  Laterality: N/A;   ESOPHAGOGASTRODUODENOSCOPY  07/09/2011   FINGER SURGERY Right 09/2007   thumb repair due to crushing injury; metal hardware   [place] (IM) NAIL INTERTROCHANTERIC Left 08/08/2018   Procedure: INTRAMEDULLARY (IM) NAIL INTERTROCHANTRIC;  Surgeon: Corky Mull, MD;  Location: [facility] ORS;  Service: Orthopedics;  Laterality: Left;   KYPHOPLASTY N/A 12/12/2020   Procedure: T 12 KYPHOPLASTY;  Surgeon: Hessie Knows, MD;  Location: [facility] ORS;  Service: Orthopedics;  Laterality: N/A;   HPI:  Pt  is a 65 y.o. male with medical history significant of Alzheimer's Dementia per chart, hypoglycimic events, ETOH abuse, HTN, type 1 diabetes on insulin pump, COPD, GERD, PVD, Parkinson's disease,  orthostatic hypotension, heavy drinker, who presents with altered mental status. At his recent admit, wife found pt unresponsive with altered mental status; his blood sugar at low at 20s. He was admitted and treated at [facility] from 01/20/21 through 01/28/21 for Severe Sepsis in the setting of aspiration pneumonia(Wife gave oral med for his blood sugar, and pt was Not swallowing per her report then), and was discharged to [facility] facility.  At this admit, he is admitted with severe DKA and septic shock due to to suspected healthcare associated pneumonia vs. aspiration pneumonia.  Pt has not been able to participate in PT at [facility] per the Family d/t blood pressure problems when attempting to stand.   CXR: Worsening opacity in the left base with suspected trace left  pleural effusion raises suspicion for developing left lower lobe  pneumonia.  2. Persistent hazy opacities in the right lower lobe with a small  right pleural effusion suggesting ongoing right basilar infection.  MRI - negative for acute event.    Assessment / Plan / Recommendation  Clinical Impression  Pt appears to present w/ grossly adequate oropharyngeal phase swallow w/ No overt neuromuscular deficits noted to impact the oropharyngeal swallow. Pt does have a baseline of Dementia; Parkinson's Disease. Any Neurological disease process can impact the swallow function. Pt consumed po trials at this bedside evaluation w/ no increase, overt clinical s/s of aspiration noted. Pt exhibited a Baseline, mild congested cough w/ exertion and at rest PRIOR to any po's -- this  cough was noted intermittently during po's but did NOT increase in frequency or intensity. Pt is admitted this time POSITIVE for Covid also.  Pt appears at reduced risk for aspiration following general aspiration precautions, especially sitting FULLY Upright for all oral intake.     During this eval, pt required verbal cues and assistance in positioning upright in bed for po  trials. He fed himself a trials w/ setup support; and w/ monitoring of boluses given. W/ the po trials, NO Immediate, overt coughing, decline in vocal quality, or change in respiratory presentation during/post trials. Oral phase appeared grossly [facility] w/ timely bolus management, mastication, and control of bolus propulsion for A-P transfer for swallowing. Oral clearing achieved w/ all trial consistencies. OM Exam appeared [facility] w/ no unilateral weakness noted nor lingual tremors/fasciculations. Speech Clear.     Recommend a mech soft diet w/ well-Cut meats, moistened foods for ease of eating; Thin liquids VIA CUP - no straws. Recommend general aspiration precautions, sitting Fully Upright, Supervision at meals d/t Cognitive decline/Dementia and d/t Parkinson's Dis. Pills Whole vs Crushed in Puree for safer, easier swallowing. GERD precautions. Education given on Pills in Puree; food consistencies and easy to eat options; general aspiration precautions to pt and Family - much education. Encouraged Pulmonary exs w/ Incentive spirometer d/t pt being sedentary in bed. ST services will monitor for any declined and/or need for objective swallowing assessment post Covid isolation requirements. SLP Visit Diagnosis: Dysphagia, unspecified (R13.10)    Aspiration Risk  Mild aspiration risk;Risk for inadequate nutrition/hydration    Diet Recommendation   mech soft diet w/ well-Cut meats, moistened foods for ease of eating; Thin liquids VIA CUP - no straws. Recommend general aspiration precautions, sitting Fully Upright, Supervision at meals d/t Cognitive decline/Dementia and d/t Parkinson's Dis. Reduce distractions at meals including Talking.   Medication Administration: Whole meds with puree (safer swallowing)    Other  Recommendations Recommended Consults: Consider GI evaluation (Dietician f/u; Palliative Care f/u) Oral Care Recommendations: Oral care BID;Oral care before and after PO;Staff/trained caregiver to  provide oral care Other Recommendations:  (n/a)    Recommendations for follow up therapy are one component of a multi-disciplinary discharge planning process, led by the attending physician.  Recommendations may be updated based on patient status, additional functional criteria and insurance authorization.  Follow up Recommendations  (TBD)      Frequency and Duration min 2x/week  1 week       Prognosis Prognosis for Safe Diet Advancement: Fair (-Good) Barriers to Reach Goals: Cognitive deficits;Time post onset;Severity of deficits;Behavior;Motivation Barriers/Prognosis Comment: ETOH abuse; Dementia; Parkinson's Dis.      Swallow Study   General Date of Onset: 03/05/21 HPI: Pt  is a 65 y.o. male with medical history significant of Alzheimer's Dementia per chart, hypoglycimic events, ETOH abuse, HTN, type 1 diabetes on insulin pump, COPD, GERD, PVD, Parkinson's disease, orthostatic hypotension, heavy drinker, who presents with altered mental status. At his recent admit, wife found pt unresponsive with altered mental status; his blood sugar at low at 20s. He was admitted and treated at [facility] from 01/20/21 through 01/28/21 for Severe Sepsis in the setting of aspiration pneumonia(Wife gave oral med for his blood sugar, and pt was Not swallowing per her report then), and was discharged to [facility] skilled [facility] facility.  At this admit, he is admitted with severe DKA and septic shock due to to suspected healthcare associated pneumonia vs. aspiration pneumonia.  Pt has not been able to participate in PT at  [facility] per the Family d/t blood pressure problems when attempting to stand.   CXR: Worsening opacity in the left base with suspected trace left  pleural effusion raises suspicion for developing left lower lobe  pneumonia.  2. Persistent hazy opacities in the right lower lobe with a small  right pleural effusion suggesting ongoing right basilar infection.  MRI - negative for acute event. Type of Study:  Bedside Swallow Evaluation Previous Swallow Assessment: 01/24/2021 Diet Prior to this Study: Dysphagia 3 (soft);Thin liquids Temperature Spikes Noted: No (wbc 6.4) Respiratory Status: Nasal cannula (2L) History of Recent Intubation: No Behavior/Cognition: Alert;Cooperative;Pleasant mood;Confused;Distractible;Requires cueing (baseline Dementia) Oral Cavity Assessment: Within Functional Limits Oral Care Completed by SLP: Recent completion by staff Oral Cavity - Dentition: Adequate natural dentition (few missing) Vision: Functional for self-feeding Self-Feeding Abilities: Able to feed self;Needs assist;Needs set up Patient Positioning: Upright in bed (needed positioning support) Baseline Vocal Quality: Normal Volitional Cough: Strong;Congested Volitional Swallow: Able to elicit    Oral/Motor/Sensory Function Overall Oral Motor/Sensory Function: Within functional limits   Ice Chips Ice chips: Within functional limits Presentation: Spoon (fed; 2 trials)   Thin Liquid Thin Liquid: Impaired Presentation: Cup;Self Fed (10+ trials) Oral Phase Impairments:  (none) Pharyngeal  Phase Impairments: Cough - Delayed (congested as at baseline - MILD) Other Comments: no different than the cough at Baseline -- did not increase in frequency w/ trials    Nectar Thick Nectar Thick Liquid: Not tested   Honey Thick Honey Thick Liquid: Not tested   Puree Puree: Within functional limits Presentation: Spoon (fed; 3 trials) Other Comments: mild cough - same as w/ thins   Solid     Solid: Within functional limits (mech soft) Presentation: Spoon (fed; 3 trials) Other Comments: mild cough - same as w/ thins        Orinda Kenner, MS, [facility] [phone] Byard Carranza 02/08/2021,4:36 PM

## 2021-02-08 NOTE — Progress Notes (Addendum)
8:15 AM noticed left facial droop with equal brow raise, smile. Equal but weak strength bilaterally in arms and legs. Patient endorses right arm weakness "for the last 3 days." Assessment completed and noted paged MD. MD assessed patient and ordered MRI head.  1030 noted patient confused, oriented x4 but speech inappropriate at times, mumbling. CN notified, code stroke called. MD notified. Unable to get pulse oximetry but all fingertips cyanotic. Respiratory called and placed on NRB. Satting on 100% after checked via forehead. See code stroke documentation.

## 2021-02-08 NOTE — Progress Notes (Signed)
Rapid Response Event Note   Reason for Call : code stroke   Initial Focused Assessment: Patient in bed with NRB on, notably blue fingertips bilaterally. Per primary RN, patient had some weakness and a mild facial droop when he went in to see patient this morning at 8AM and MD assessed patient at that time. Code stroke activated due to mental status change and hypoxia. Primary RN stated that he was unable to get a pulse ox on the patient, so he placed the patient on a NRB, and prior to that point the patient was confused.  Primary RN reports that patient was saying things that didn't make sense and was disoriented to place and time. Upon my assessment pt oriented to place, thought it was September. Able to hold left arm up against gravity, however right arm fell to bed with no effort against gravity. Attending MD to bedside, stated that he had no changes from this morning. Dr. Quinn Axe to bedside, per Dr. Wynelle Cleveland cancel code stroke.       Interventions:  NIH scale   Plan of Care: to move to PCU    Event Summary:   MD Notified: [place] Call Time: [place] Time: 1044 End Time:1115  Mila Merry, RN

## 2021-02-08 NOTE — Progress Notes (Signed)
Inpatient stroke code cancel per Dr. Wynelle Cleveland.  Su Monks, MD [facility] [phone]  If 7pm- 7am, please page neurology on call as listed in [place].

## 2021-02-08 DEATH — deceased

## 2021-02-09 DIAGNOSIS — A419 Sepsis, unspecified organism: Secondary | ICD-10-CM | POA: Diagnosis not present

## 2021-02-09 DIAGNOSIS — N179 Acute kidney failure, unspecified: Secondary | ICD-10-CM | POA: Diagnosis not present

## 2021-02-09 DIAGNOSIS — E1011 Type 1 diabetes mellitus with ketoacidosis with coma: Secondary | ICD-10-CM | POA: Diagnosis not present

## 2021-02-09 DIAGNOSIS — U071 COVID-19: Secondary | ICD-10-CM | POA: Diagnosis not present

## 2021-02-09 LAB — GLUCOSE, CAPILLARY
Glucose-Capillary: 184 mg/dL — ABNORMAL HIGH (ref 70–99)
Glucose-Capillary: 191 mg/dL — ABNORMAL HIGH (ref 70–99)
Glucose-Capillary: 163 mg/dL — ABNORMAL HIGH (ref 70–99)
Glucose-Capillary: 141 mg/dL — ABNORMAL HIGH (ref 70–99)

## 2021-02-09 LAB — MAGNESIUM: Magnesium: 1.8 mg/dL (ref 1.7–2.4)

## 2021-02-09 LAB — CBC
HCT: 22.4 % — ABNORMAL LOW (ref 39.0–52.0)
Hemoglobin: 7.4 g/dL — ABNORMAL LOW (ref 13.0–17.0)
MCH: 32.7 pg (ref 26.0–34.0)
MCHC: 33 g/dL (ref 30.0–36.0)
MCV: 99.1 fL (ref 80.0–100.0)
Platelets: 236 10*3/uL (ref 150–400)
RBC: 2.26 MIL/uL — ABNORMAL LOW (ref 4.22–5.81)
RDW: 14 % (ref 11.5–15.5)
WBC: 5 10*3/uL (ref 4.0–10.5)
nRBC: 0 % (ref 0.0–0.2)

## 2021-02-09 LAB — BASIC METABOLIC PANEL
Anion gap: 12 (ref 5–15)
BUN: 21 mg/dL (ref 8–23)
CO2: 19 mmol/L — ABNORMAL LOW (ref 22–32)
Calcium: 7.9 mg/dL — ABNORMAL LOW (ref 8.9–10.3)
Chloride: 109 mmol/L (ref 98–111)
Creatinine, Ser: 1.27 mg/dL — ABNORMAL HIGH (ref 0.61–1.24)
GFR, Estimated: >60 mL/min (ref >60)
Glucose, Bld: 169 mg/dL — ABNORMAL HIGH (ref 70–99)
Potassium: 3.3 mmol/L — ABNORMAL LOW (ref 3.5–5.1)
Sodium: 140 mmol/L (ref 135–145)

## 2021-02-09 LAB — PHOSPHORUS: Phosphorus: 2.1 mg/dL — ABNORMAL LOW (ref 2.5–4.6)

## 2021-02-09 MED ORDER — LINAGLIPTIN 5 MG PO TABS
5.0000 mg | ORAL_TABLET | Freq: Every day | ORAL | Status: DC
  Administered 2021-02-10: 5 mg via ORAL
  Filled 2021-02-09 (×2): qty 1

## 2021-02-09 MED ORDER — LINAGLIPTIN 5 MG PO TABS: 5.0000 mg | ORAL_TABLET | Freq: Every day | ORAL | Status: DC

## 2021-02-09 MED ORDER — K PHOS MONO-SOD PHOS DI & MONO 155-852-130 MG PO TABS
500.0000 mg | ORAL_TABLET | ORAL | Status: AC
  Administered 2021-02-09: 500 mg via ORAL
  Filled 2021-02-09 (×2): qty 2

## 2021-02-09 MED ORDER — POTASSIUM CHLORIDE 20 MEQ PO PACK
40.0000 meq | PACK | ORAL | Status: AC
  Administered 2021-02-09 (×2): 40 meq via ORAL
  Filled 2021-02-09 (×2): qty 2

## 2021-02-09 NOTE — Evaluation (Signed)
Physical Therapy Evaluation Patient Details Name: John Reilly MRN: [id number] DOB: 10-04-1955 Today's Date: 02/09/2021  History of Present Illness  Patient is a 65 year old male here from [facility] for worsening AMS, hypoxia, hyperglycemia. Recently treated for pneumonia. Found to have septic shock secondary to COVID-19 related pneumonia and possibly aspiration pneumonia, right side facial weakness and droop. MRI of brain reported no acute intracranial abnormality. PT ordered as patient with severe deconditioning.   Clinical Impression  Patient agreeable to PT and eager to get up to the chair. He reports he required assistance of one person at SNF recently for sit to stand transfers and was unable to walk during rehab efforts due to blood pressure issues.  MD and nurse present for mobility efforts during evaluation. Patient requires assistance for all mobility and is very deconditioned. Patient was able to stand and pivot to the chair with +2 person assistance. Standing tolerance ~ 1 minute with 2 person assistance. Blood pressures remained low during mobility efforts and patient reports no dizziness with any activity.   96/63 sitting on edge of bed, 69/56 with HR 138 after getting to recliner chair, 75/58 after seated in chair for ~ 1 minute with LE reclined.  Patient needs continued PT to maximize independence and facilitate return to prior level of function. SNF is recommended at discharge for ongoing PT efforts.      Recommendations for follow up therapy are one component of a multi-disciplinary discharge planning process, led by the attending physician.  Recommendations may be updated based on patient status, additional functional criteria and insurance authorization.  Follow Up Recommendations SNF    Equipment Recommendations  Other (comment) (to be determined at next level of care)    Recommendations for Other Services OT consult     Precautions / Restrictions  Precautions Precautions: Fall Restrictions Weight Bearing Restrictions: No      Mobility  Bed Mobility Overal bed mobility: Needs Assistance Bed Mobility: Supine to Sit;Sit to Supine     Supine to sit: Max assist     General bed mobility comments: assistance for trunk and BLE support. increased time and effort required with mobility efforts. blood pressure monitored throughout session. 96/63 after sitting upright ~ 1 minute. no dizziness is reported with activity    Transfers Overall transfer level: Needs assistance   Transfers: Sit to/from Stand;Stand Pivot Transfers Sit to Stand: Mod assist;+2 physical assistance Stand pivot transfers: Mod assist;+2 physical assistance       General transfer comment: Dr Wynelle Cleveland present during mobility portion of evaluation and  assisted with the +2 person assistance. compression hose donned prior to standing. patient able to complete sit to stand transfer with +2 person assistance, stand for at lest one minute with assistance, and pivot to recliner chair with +2 person assistance. blood pressure once in the chair was 69/56. blood pressure increased to 75/58 after a minute of sitting up chair with LE reclined. again, patient reports no dizziness with upright activity.  Ambulation/Gait             General Gait Details: unable to progress ambulation due to generalized weakness and decreased standing activity tolerance  Stairs            Wheelchair Mobility    Modified Rankin (Stroke Patients Only)       Balance Overall balance assessment: Needs assistance Sitting-balance support: Feet supported;Bilateral upper extremity supported Sitting balance-[name]: Fair Sitting balance - Comments: patient with kyphotic posture and flexed trunk despite  cues for erect posture   Standing balance support: Bilateral upper extremity supported Standing balance-[name]: Poor Standing balance comment: +2 person assistance required to  maintain standing balance with flexed posture. faciliation for upright posture                             Pertinent Vitals/Pain Pain Assessment: Faces Faces Pain Scale: Hurts little more Pain Location: thoracic and lumbar area posteriorly Pain Descriptors / Indicators: Sore Pain Intervention(s): Limited activity within patient's tolerance    Home Living Family/patient expects to be discharged to:: Private residence Living Arrangements: Spouse/significant other Available Help at Discharge: Family                  Prior Function Level of Independence: Needs assistance         Comments: patient previously from SNF where he was getting rehab. patient reports he was standing with one person assistance but unable to get out of bed or walk due to blood pressure issues each time     Hand Dominance        Extremity/Trunk Assessment   Upper Extremity Assessment Upper Extremity Assessment: RUE deficits/detail (generalized weakness BUE) RUE Deficits / Details: active shoulder movement limited. elbow flexion AROM WFL. edematous throughout. education provided on positioning    Lower Extremity Assessment Lower Extremity Assessment: Generalized weakness (edematous distally>proximally)       Communication   Communication: No difficulties  Cognition Arousal/Alertness: Awake/alert Behavior During Therapy: Flat affect Overall Cognitive Status: No family/caregiver present to determine baseline cognitive functioning Area of Impairment: Following commands;Safety/judgement                       Following Commands: Follows one step commands with increased time Safety/Judgement: Decreased awareness of safety;Decreased awareness of deficits     General Comments: patient able to follow single step commands consistently      General Comments General comments (skin integrity, edema, etc.): MD removed femoral IV prior to mobilizing.    Exercises General  Exercises - Lower Extremity Ankle Circles/Pumps: AROM;Strengthening;Both;10 reps;Seated   Assessment/Plan    PT Assessment Patient needs continued PT services  PT Problem List Decreased strength;Decreased range of motion;Decreased activity tolerance;Decreased balance;Decreased mobility;Decreased safety awareness;Decreased knowledge of use of DME       PT Treatment Interventions DME instruction;Gait training;Stair training;Functional mobility training;Therapeutic activities;Therapeutic exercise;Neuromuscular re-education;Balance training;Patient/family education    PT Goals (Current goals can be found in the Care Plan section)  Acute Rehab PT Goals Patient Stated Goal: to get to the chair PT Goal Formulation: With patient Time For Goal Achievement: 02/23/21 Potential to Achieve Goals: Good    Frequency Min 2X/week   Barriers to discharge        Co-evaluation               AM-PAC PT "6 Clicks" Mobility  Outcome Measure Help needed turning from your back to your side while in a flat bed without using bedrails?: A Lot Help needed moving from lying on your back to sitting on the side of a flat bed without using bedrails?: A Lot Help needed moving to and from a bed to a chair (including a wheelchair)?: Total Help needed standing up from a chair using your arms (e.g., wheelchair or bedside chair)?: Total Help needed to walk in hospital room?: Total Help needed climbing 3-5 steps with a railing? : Total 6 Click Score: 8  End of Session   Activity Tolerance: Patient limited by fatigue Patient left: in chair;with call bell/phone within reach;with chair alarm set Nurse Communication: Mobility status (nurse and MD in the room during mobility efforts and transfer to chair) PT Visit Diagnosis: Unsteadiness on feet (R26.81);Muscle weakness (generalized) (M62.81);Difficulty in walking, not elsewhere classified (R26.2)    Time: 3837-7939 PT Time Calculation (min) (ACUTE ONLY): 58  min   Charges:   PT Evaluation $PT Eval Moderate Complexity: 1 Mod PT Treatments $Therapeutic Activity: 38-52 mins        Minna Merritts, PT, MPT   Percell Locus 02/09/2021, 11:26 AM

## 2021-02-09 NOTE — Progress Notes (Signed)
Physical Therapy Treatment Patient Details Name: John Reilly MRN: [id number] DOB: 01-09-1956 Today's Date: 02/09/2021   History of Present Illness Patient is a 65 year old male here from [facility] for worsening AMS, hypoxia, hyperglycemia. Recently treated for pneumonia. Found to have septic shock secondary to COVID-19 related pneumonia and possibly aspiration pneumonia, right side facial weakness and droop. MRI of brain reported no acute intracranial abnormality. PT ordered as patient with severe deconditioning.    PT Comments    PT sitting up in the chair on arrival to room and requesting to return to bed. Patient had been up in the chair for almost 2 hours. Blood pressure 110/67 with heart rate 103 in seated position. Patient required only maximal assistance of one person for squat pivot transfer from recliner chair to bed. Verbal cues for technique. Patient fatigued with activity and unable to stand for ambulation at this time. Recommend to continue with PT to maximize independence. SNF recommended at discharge.    Recommendations for follow up therapy are one component of a multi-disciplinary discharge planning process, led by the attending physician.  Recommendations may be updated based on patient status, additional functional criteria and insurance authorization.  Follow Up Recommendations  SNF     Equipment Recommendations  Other (comment) (to be determined at next level of care)    Recommendations for Other Services OT consult     Precautions / Restrictions Precautions Precautions: Fall Restrictions Weight Bearing Restrictions: No     Mobility  Bed Mobility Overal bed mobility: Needs Assistance Bed Mobility: Sit to Supine     Supine to sit: Max assist Sit to supine: Mod assist   General bed mobility comments: assistance for BLE support. verbal cues for technique. increased time and effort required    Transfers Overall transfer level: Needs assistance    Transfers: Squat Pivot Transfers Sit to Stand: Mod assist;+2 physical assistance Stand pivot transfers: Mod assist;+2 physical assistance Squat pivot transfers: Max assist     General transfer comment: verbal cues for technique to facilitate independence with squat pivot transfer to chair. patient transferred with maximal assistance of one person.  Ambulation/Gait             General Gait Details: not attempted due to fatigue with activity, decreased activity tolerance overall   Stairs             Wheelchair Mobility    Modified Rankin (Stroke Patients Only)       Balance Overall balance assessment: Needs assistance Sitting-balance support: Feet supported;Bilateral upper extremity supported Sitting balance-[name]: Fair Sitting balance - Comments: patient with kyphotic posture and flexed trunk despite cues for erect posture   Standing balance support: Bilateral upper extremity supported Standing balance-[name]: Poor Standing balance comment: +2 person assistance required to maintain standing balance with flexed posture. faciliation for upright posture                            Cognition Arousal/Alertness: Awake/alert Behavior During Therapy: Flat affect Overall Cognitive Status: No family/caregiver present to determine baseline cognitive functioning Area of Impairment: Following commands;Safety/judgement                       Following Commands: Follows one step commands with increased time Safety/Judgement: Decreased awareness of safety;Decreased awareness of deficits     General Comments: patient following single step commands consistently      Exercises General Exercises -  Lower Extremity Ankle Circles/Pumps: AROM;Strengthening;Both;10 reps;Seated    General Comments General comments (skin integrity, edema, etc.): blood pressure 110/67 in sitting position with heart rate 103bpm prior to mobilizing. no dizziness reported  during transfer.      Pertinent Vitals/Pain Pain Assessment: Faces Faces Pain Scale: Hurts little more Pain Location: thoracic and lumbar area posteriorly Pain Descriptors / Indicators: Sore Pain Intervention(s): Limited activity within patient's tolerance    Home Living Family/patient expects to be discharged to:: Private residence Living Arrangements: Spouse/significant other Available Help at Discharge: Family                Prior Function Level of Independence: Needs assistance      Comments: patient previously from SNF where he was getting rehab. patient reports he was standing with one person assistance but unable to get out of bed or walk due to blood pressure issues each time   PT Goals (current goals can now be found in the care plan section) Acute Rehab PT Goals Patient Stated Goal: to increase strength PT Goal Formulation: With patient Time For Goal Achievement: 02/23/21 Potential to Achieve Goals: Good Progress towards PT goals: Progressing toward goals    Frequency    Min 2X/week      PT Plan Current plan remains appropriate    Co-evaluation              AM-PAC PT "6 Clicks" Mobility   Outcome Measure  Help needed turning from your back to your side while in a flat bed without using bedrails?: A Lot Help needed moving from lying on your back to sitting on the side of a flat bed without using bedrails?: A Lot Help needed moving to and from a bed to a chair (including a wheelchair)?: A Lot Help needed standing up from a chair using your arms (e.g., wheelchair or bedside chair)?: A Lot Help needed to walk in hospital room?: Total Help needed climbing 3-5 steps with a railing? : Total 6 Click Score: 10    End of Session   Activity Tolerance: Patient limited by fatigue Patient left: in bed;with call bell/phone within reach;with nursing/sitter in room;with bed alarm set Nurse Communication: Mobility status (nurse present in the room at end of  session) PT Visit Diagnosis: Unsteadiness on feet (R26.81);Muscle weakness (generalized) (M62.81);Difficulty in walking, not elsewhere classified (R26.2)     Time: 9371-6967 PT Time Calculation (min) (ACUTE ONLY): 17 min  Charges:  $Therapeutic Activity: 8-22 mins                     Minna Merritts, PT, MPT    Percell Locus 02/09/2021, 1:20 PM

## 2021-02-09 NOTE — Progress Notes (Signed)
Pt mobilizing with PT. Upon getting to chair, BP 69/56 HR 138. MD in room and aware. Will encourage oral intake and continue to monitor.

## 2021-02-09 NOTE — Progress Notes (Addendum)
PROGRESS NOTE    John Reilly   ZOX:[id number]  DOB: [birth year]  DOA: 02/06/2021 PCP: Adin Hector, MD   Brief Narrative:  John Reilly is a 65 year old male with type 1 diabetes, COPD, hypertension, alcohol abuse, Parkinson's disease, hyperlipidemia who presented from a skilled nursing facility to the ED on 9/26 with confusion.    In the ED, noted to be in DKA and septic shock secondary to suspected pneumonia.  Admitted to the ICU, started on broad-spectrum antibiotics, Levophed and insulin infusions. His condition improved to be transferred out of the ICU and to the triad hospitalist service today.  Patient was recently hospitalized from 9/12 through 9/20 and he was found hypoglycemic and unresponsive by his wife.  In the hospital he was treated for sepsis secondary to aspiration pneumonia.  He was discharged to skilled nursing facility.  Subjective: She has no new complaints today.  He is adamant about getting out of bed and having the chance to sit up in a chair.    Assessment & Plan:    Principal problem: Septic shock secondary to COVID-19 related pneumonia and possibly aspiration pneumonia -Chest x-ray on 9/26 revealed worsening opacity in the left base and persistent hazy opacities in the right lower lobe - Being treated with Remdesivir and Cefepime -blood cultures have remained negative- vancomycin discontinued -Of note, C. difficile PCR is positive and antigen is positive but toxin is negative-watery stools have resolved and were likely secondary to COVID-19 infection  Active Problems:  Right sided weakness and right facial droop - noted on 10/1> MRI ordered and was negative for an acute CVA  AKI, anion gap metabolic acidosis, hyponatremia - Baseline creatinine is about 0.8 2.9 - Admitted with creatinine of 2.10 which has steadily improved to normal - Urine output has been poor as patient is third spacing fluids - Cr up to 1.27 today- continue to  follow  Hypokalemia and hypophosphatemia -will continue to replace and recheck -Magnesium level is normal  Hypoalbuminemia with anasarca/third spacing of fluids - offer nutritional supplements- elevate extremities- discussed with RN  Acute metabolic encephalopathy - Secondary to sepsis - Has resolved  Diarrhea - Related to COVID-19 infection but also may be due to Creon not being resumed - - stool is beginning to solidify- he is eating solid food quite well.  Orthostatic hypotension - ?  If related to Parkinson's disease - Receiving Florinef and Midodrine -Continue TED hose especially when getting the patient out of bed -I was in the room and helped with standing him up-upon standing, his blood pressure dropped significantly however, he remains asymptomatic.  In fact he was able to stand for 3 to 5 minutes and then transition to a chair with assistance - I recommend that we continue to transfer him from bed to chair daily and begin to work on ambulation  Diabetes mellitus type 1 with DKA followed by hypoglycemia -DKA resolved-  -Hemoglobin A1C 5.2 -Home medication includes Lantus and Humalog- - Hypoglycemia occurred on 9/29> dropped Lantus dose of 4 U and reduced Humolog sliding scale- following oral intake and glucose levels -As hemoglobin A1c is well controlled and Lantus dose is low, I feel that we can safely transition him to an oral regimen  - will start Tradjenta 5 mg daily- can continue a low dose SSI for now and follow-  COPD exacerbation? -Per critical care notes, they felt the patient had a COPD exacerbation on admission - No wheezing noted at this time-  Hypokalemia -  Likely secondary to diarrhea - Continue to replace aggressively - Magnesium 2.0  Normocytic anemia - Continue to follow  Pancreatic insufficiency - continue Creon  Parkinson's disease - Continue Sinemet  Alzheimer's dementia - Continue Exelon- overall he is very oriented to time place and  situation  Severe protein calorie malnutrition - Continue dietary supplements  Severe deconditioning - cont to work with PT- extremely weak at this tim- able to get out of bed with 2 person assist    Time spent in minutes: 35 DVT prophylaxis: heparin injection 5,000 Units Start: 01/26/2021 1600 SCDs Start: 01/16/2021 1549 Code Status: Full code Family Communication:  Level of Care: Level of care: Progressive Cardiac Disposition Plan:  Status is: Inpatient  Remains inpatient appropriate because:IV treatments appropriate due to intensity of illness or inability to take PO  Dispo: The patient is from: SNF              Anticipated d/c is to: SNF on 10/7 due to isolation period required for COVID 19              Patient currently is not medically stable to d/c.   Difficult to place patient No   Consultants:  critical care service Procedures:   Antimicrobials:  Anti-infectives (From admission, onward)    Start     Dose/Rate Route Frequency Ordered Stop   02/07/21 2200  ceFEPIme (MAXIPIME) 2 g in sodium chloride 0.9 % 100 mL IVPB        2 g 200 mL/hr over 30 Minutes Intravenous Every 12 hours 02/07/21 1432 02/08/21 1052   02/04/21 1600  vancomycin (VANCOREADY) IVPB 750 mg/150 mL  Status:  Discontinued        750 mg 150 mL/hr over 60 Minutes Intravenous Every 24 hours 02/04/21 1112 02/05/21 1252   02/04/21 1000  remdesivir 100 mg in sodium chloride 0.9 % 100 mL IVPB       See Hyperspace for full Linked Orders Report.   100 mg 200 mL/hr over 30 Minutes Intravenous Daily 01/23/2021 1800 02/07/21 0937   02/04/21 0200  ceFEPIme (MAXIPIME) 2 g in sodium chloride 0.9 % 100 mL IVPB  Status:  Discontinued        2 g 200 mL/hr over 30 Minutes Intravenous Every 12 hours 01/15/2021 1558 02/07/21 1432   02/01/2021 1845  remdesivir 200 mg in sodium chloride 0.9% 250 mL IVPB       See Hyperspace for full Linked Orders Report.   200 mg 580 mL/hr over 30 Minutes Intravenous Once 01/30/2021 1800  02/04/2021 2025   01/24/2021 1557  vancomycin variable dose per unstable renal function (pharmacist dosing)  Status:  Discontinued         Does not apply See admin instructions 01/12/2021 1558 02/04/21 1112   02/04/2021 1430  vancomycin (VANCOREADY) IVPB 1500 mg/300 mL        1,500 mg 150 mL/hr over 120 Minutes Intravenous  Once 01/29/2021 1403 01/29/2021 1641   01/11/2021 1415  ceFEPIme (MAXIPIME) 2 g in sodium chloride 0.9 % 100 mL IVPB        2 g 200 mL/hr over 30 Minutes Intravenous  Once 01/09/2021 1403 01/13/2021 1840   01/28/2021 1415  metroNIDAZOLE (FLAGYL) IVPB 500 mg        500 mg 100 mL/hr over 60 Minutes Intravenous  Once 01/24/2021 1403 01/30/2021 1938        Objective: Vitals:   02/08/21 0815 02/08/21 2055 02/09/21 0624 02/09/21 0819  BP: (!) 112/57 116/73  132/70 (!) 129/59  Pulse: (!) 104 74 70 86  Resp:    18  Temp: 97.7 F (36.5 C) (!) 97.5 F (36.4 C) 97.7 F (36.5 C) (!) 97.5 F (36.4 C)  TempSrc: Oral Oral Oral Oral  SpO2:  100% 95% 100%  Weight:      Height:        Intake/Output Summary (Last 24 hours) at 02/09/2021 1203 Last data filed at 02/09/2021 0645 Gross per 24 hour  Intake 742.54 ml  Output 550 ml  Net 192.54 ml    Filed Weights   01/31/2021 1438 02/04/2021 1715 02/04/21 0545  Weight: 65.1 kg 59.8 kg 61.9 kg    Examination: General exam: Appears comfortable  HEENT: PERRLA, oral mucosa moist, no sclera icterus or thrush Respiratory system: Clear to auscultation. Respiratory effort normal. Cardiovascular system: S1 & S2 heard, regular rate and rhythm Gastrointestinal system: Abdomen soft, non-tender, nondistended. Normal bowel sounds   Central nervous system: Alert and oriented. No focal neurological deficits. Extremities: No cyanosis, clubbing - has dependent pitting edema Skin: No rashes or ulcers Psychiatry:  Mood & affect appropriate.      Data Reviewed: I have personally reviewed following labs and imaging studies  CBC: Recent Labs  Lab 02/02/2021 1338  02/04/21 0130 02/05/21 0641 02/06/21 0640 02/07/21 0615 02/08/21 0650 02/09/21 0640  WBC 17.4*   < > 6.9 7.8 9.2 6.4 5.0  NEUTROABS 13.6*  --   --   --   --   --   --   HGB 9.0*   < > 7.7* 8.3* 8.4* 7.8* 7.4*  HCT 31.0*   < > 22.0* 24.6* 25.9* 23.3* 22.4*  MCV 114.8*   < > 98.7 98.4 99.6 100.0 99.1  PLT 491*   < > 252 244 263 228 236   < > = values in this interval not displayed.    Basic Metabolic Panel: Recent Labs  Lab 02/05/21 0641 02/06/21 0640 02/07/21 0615 02/08/21 0650 02/09/21 0640  NA 137 139 139 139 140  K 2.9* 3.5 3.8 3.3* 3.3*  CL 100 106 109 108 109  CO2 26 27 24 22  19*  GLUCOSE 163* 159* 161* 208* 169*  BUN 20 19 21 18 21   CREATININE 1.49* 1.16 1.14 1.25* 1.27*  CALCIUM 7.8* 7.7* 7.7* 7.8* 7.9*  MG 2.0 1.7 2.0 1.7 1.8  PHOS 2.5 2.8 2.5 2.3* 2.1*    GFR: Estimated Creatinine Clearance: 50.1 mL/min (A) (by C-G formula based on SCr of 1.27 mg/dL (H)). Liver Function Tests: Recent Labs  Lab 02/04/21 0130 02/05/21 0641 02/06/21 0640 02/07/21 0615 02/08/21 0650  AST 30 25 16  14* 12*  ALT 8 9 11 12 12   ALKPHOS 79 65 70 73 68  BILITOT 0.7 0.6 0.9 0.7 1.0  PROT 4.6* 3.9* 4.1* 4.2* 3.9*  ALBUMIN 2.1* 1.8* 1.9* 1.8* 1.7*    No results for input(s): LIPASE, AMYLASE in the last 168 hours. No results for input(s): AMMONIA in the last 168 hours. Coagulation Profile: Recent Labs  Lab 02/04/2021 1338  INR 1.3*    Cardiac Enzymes: No results for input(s): CKTOTAL, CKMB, CKMBINDEX, TROPONINI in the last 168 hours. BNP (last 3 results) No results for input(s): PROBNP in the last 8760 hours. HbA1C: No results for input(s): HGBA1C in the last 72 hours. CBG: Recent Labs  Lab 02/08/21 1042 02/08/21 1313 02/08/21 1643 02/08/21 2055 02/09/21 0813  GLUCAP 257* 198* 201* 139* 184*    Lipid Profile: No results for input(s): CHOL,  HDL, LDLCALC, TRIG, CHOLHDL, LDLDIRECT in the last 72 hours. Thyroid Function Tests: No results for input(s): TSH,  T4TOTAL, FREET4, T3FREE, THYROIDAB in the last 72 hours. Anemia Panel: Recent Labs    02/07/21 0615 02/08/21 0650  FERRITIN 417* 305    Urine analysis:    Component Value Date/Time   COLORURINE YELLOW (A) 01/15/2021 1442   APPEARANCEUR CLOUDY (A) 01/15/2021 1442   LABSPEC 1.016 01/09/2021 1442   PHURINE 5.0 01/22/2021 1442   GLUCOSEU >=500 (A) 02/01/2021 1442   HGBUR SMALL (A) 01/31/2021 1442   BILIRUBINUR NEGATIVE 01/10/2021 1442   KETONESUR 5 (A) 01/26/2021 1442   PROTEINUR 30 (A) 01/16/2021 1442   NITRITE NEGATIVE 01/28/2021 1442   LEUKOCYTESUR NEGATIVE 02/06/2021 1442   Sepsis Labs: @LABRCNTIP (procalcitonin:4,lacticidven:4) ) Recent Results (from the past 240 hour(s))  Resp Panel by RT-PCR (Flu A&B, Covid) Nasopharyngeal Swab     Status: Abnormal   Collection Time: 01/24/2021  2:42 PM   Specimen: Nasopharyngeal Swab; Nasopharyngeal(NP) swabs in vial transport medium  Result Value Ref Range Status   SARS Coronavirus 2 by RT PCR POSITIVE (A) NEGATIVE Final    Comment: RESULT CALLED TO, READ BACK BY AND VERIFIED WITH: SHANNON STARK @1744  01/29/2021 MJU (NOTE) SARS-CoV-2 target nucleic acids are DETECTED.  The SARS-CoV-2 RNA is generally detectable in upper respiratory specimens during the acute phase of infection. Positive results are indicative of the presence of the identified virus, but do not rule out bacterial infection or co-infection with other pathogens not detected by the test. Clinical correlation with patient history and other diagnostic information is necessary to determine patient infection status. The expected result is Negative.  Fact Sheet for Patients: EntrepreneurPulse.com.au  Fact Sheet for Healthcare Providers: IncredibleEmployment.be  This test is not yet approved or cleared by the [place] FDA and  has been authorized for detection and/or diagnosis of SARS-CoV-2 by FDA under an Emergency Use Authorization  (EUA).  This EUA will remain in effect (meaning this test can be u sed) for the duration of  the COVID-19 declaration under Section 564(b)(1) of the Act, 21 U.S.C. section 360bbb-3(b)(1), unless the authorization is terminated or revoked sooner.     Influenza A by PCR NEGATIVE NEGATIVE Final   Influenza B by PCR NEGATIVE NEGATIVE Final    Comment: (NOTE) The Xpert Xpress SARS-CoV-2/FLU/RSV plus assay is intended as an aid in the diagnosis of influenza from Nasopharyngeal swab specimens and should not be used as a sole basis for treatment. Nasal washings and aspirates are unacceptable for Xpert Xpress SARS-CoV-2/FLU/RSV testing.  Fact Sheet for Patients: EntrepreneurPulse.com.au  Fact Sheet for Healthcare Providers: IncredibleEmployment.be  This test is not yet approved or cleared by the [place] FDA and has been authorized for detection and/or diagnosis of SARS-CoV-2 by FDA under an Emergency Use Authorization (EUA). This EUA will remain in effect (meaning this test can be used) for the duration of the COVID-19 declaration under Section 564(b)(1) of the Act, 21 U.S.C. section 360bbb-3(b)(1), unless the authorization is terminated or revoked.  Performed at [facility], [address] [place], [place] [address]   Blood Culture (routine x 2)     Status: None   Collection Time: 01/10/2021  2:42 PM   Specimen: BLOOD  Result Value Ref Range Status   Specimen Description BLOOD RIGHT ANTECUBITAL  Final   Special Requests   Final    BOTTLES DRAWN AEROBIC AND ANAEROBIC Blood Culture adequate volume   Culture   Final    NO  GROWTH 5 DAYS Performed at [facility], [address] [place], [place] [address]    Report Status 02/08/2021 FINAL  Final  Blood Culture (routine x 2)     Status: None   Collection Time: 02/07/2021  2:42 PM   Specimen: BLOOD  Result Value Ref Range Status   Specimen Description BLOOD BLOOD RIGHT HAND   Final   Special Requests   Final    BOTTLES DRAWN AEROBIC AND ANAEROBIC Blood Culture adequate volume   Culture   Final    NO GROWTH 5 DAYS Performed at [facility], [address], [place], [place] [address]    Report Status 02/08/2021 FINAL  Final  Urine Culture     Status: None   Collection Time: 01/28/2021  2:42 PM   Specimen: In/Out Cath Urine  Result Value Ref Range Status   Specimen Description   Final    IN/OUT CATH URINE Performed at [facility], [address], [place], [place] [address]    Special Requests   Final    NONE Performed at [facility], [address], [place], [place] [address]    Culture   Final    NO GROWTH Performed at [facility], [place] [address], [place], [place] [address]    Report Status 02/04/2021 FINAL  Final  MRSA Next Gen by PCR, Nasal     Status: Abnormal   Collection Time: 01/25/2021  5:17 PM   Specimen: Nasal Mucosa; Nasal Swab  Result Value Ref Range Status   MRSA by PCR Next Gen DETECTED (A) NOT DETECTED Final    Comment: RESULT CALLED TO, READ BACK BY AND VERIFIED WITH: SHANNON STARK @1855  ON 01/11/2021 SKL (NOTE) The GeneXpert MRSA Assay (FDA approved for NASAL specimens only), is one component of a comprehensive MRSA colonization surveillance program. It is not intended to diagnose MRSA infection nor to guide or monitor treatment for MRSA infections. Test performance is not FDA approved in patients less than 74 years old. Performed at [facility], [place], [place] [address]   C Difficile Quick Screen w PCR reflex     Status: Abnormal   Collection Time: 01/26/2021 11:15 PM   Specimen: STOOL  Result Value Ref Range Status   C Diff antigen POSITIVE (A) NEGATIVE Final   C Diff toxin NEGATIVE NEGATIVE Final   C Diff interpretation Results are indeterminate. See PCR results.  Final    Comment: Performed at [facility], [address] [place], [place] [address]  C.  Diff by PCR, Reflexed     Status: Abnormal   Collection Time: 01/18/2021 11:15 PM  Result Value Ref Range Status   Toxigenic C. Difficile by PCR POSITIVE (A) NEGATIVE Final    Comment: Positive for toxigenic C. difficile with little to no toxin production. Only treat if clinical presentation suggests symptomatic illness. Performed at [facility], [address], [place], [place] [address]          Radiology Studies: MR BRAIN WO CONTRAST  Result Date: 02/08/2021 CLINICAL DATA:  Neuro deficit, acute, stroke suspected right arm weakness, right facial droop. EXAM: MRI HEAD WITHOUT CONTRAST TECHNIQUE: Multiplanar, multiecho pulse sequences of the brain and surrounding structures were obtained without intravenous contrast. COMPARISON:  Head CT 01/20/2021 and MRI 12/16/2016 FINDINGS: Brain: There is no evidence of an acute infarct, intracranial hemorrhage, mass, midline shift, or extra-axial fluid collection. T2 hyperintensities in the cerebral white matter bilaterally have likely slightly  progressed from 2018 and are nonspecific but compatible with mild chronic small vessel ischemic disease. There is mild to moderate generalized cerebral atrophy. Vascular: Major intracranial vascular flow voids are preserved. Skull and upper cervical spine: Unremarkable bone marrow signal para Sinuses/Orbits: Unremarkable orbits. Clear paranasal sinuses. Small bilateral mastoid effusions. Other: None. IMPRESSION: 1. No acute intracranial abnormality. 2. Mild chronic small vessel ischemic disease and cerebral atrophy. Electronically Signed   By: Logan Bores M.D.   On: 02/08/2021 13:31      Scheduled Meds:  vitamin C  500 mg Oral Daily   bismuth subsalicylate  30 mL Oral QID   Chlorhexidine Gluconate Cloth  6 each Topical Q0600   feeding supplement  237 mL Oral TID BM   fludrocortisone  200 mcg Oral Daily   folic acid  1 mg Oral Daily   heparin  5,000 Units Subcutaneous Q8H   insulin aspart  0-6 Units  Subcutaneous TID WC   insulin glargine-yfgn  4 Units Subcutaneous Daily   lipase/protease/amylase  36,000 Units Oral TID AC   midodrine  5 mg Oral TID WC   multivitamin with minerals  1 tablet Oral Daily   pantoprazole  40 mg Oral Daily   phosphorus  500 mg Oral Q4H   potassium chloride  40 mEq Oral Q4H   rivastigmine  1.5 mg Oral BID   thiamine  100 mg Oral Daily   zinc sulfate  220 mg Oral Daily   Continuous Infusions:     LOS: 6 days      Debbe Odea, MD [facility] Pager: [email or web address] 02/09/2021, 12:03 PM

## 2021-02-09 NOTE — Consult Note (Signed)
PHARMACY CONSULT NOTE  Pharmacy Consult for Electrolyte Monitoring and Replacement   Recent Labs: Potassium (mmol/L)  Date Value  02/09/2021 3.3 (L)   Magnesium (mg/dL)  Date Value  02/09/2021 1.8   Calcium (mg/dL)  Date Value  02/09/2021 7.9 (L)   Albumin (g/dL)  Date Value  02/08/2021 1.7 (L)   Phosphorus (mg/dL)  Date Value  02/09/2021 2.1 (L)   Sodium (mmol/L)  Date Value  02/09/2021 140   Corrected Ca: 9.4 mg/dL  Assessment: 65 y.o. male with a past medical history of type 1 diabetes, COPD, hypertension, and hyperlipidemia who presents with altered mental status and DKA now transitioned to SSI + glargine.  Goal of Therapy:  Electrolytes WNL  Plan:  Will give Kcl 40 mEq x 2 and Kphos tabs 2 tabs x 2 Recheck electrolytes with am labs   Oswald Hillock, PharmD, BCPS Clinical Pharmacist 02/09/2021 7:51 AM

## 2021-02-10 DIAGNOSIS — E1011 Type 1 diabetes mellitus with ketoacidosis with coma: Secondary | ICD-10-CM | POA: Diagnosis not present

## 2021-02-10 DIAGNOSIS — N179 Acute kidney failure, unspecified: Secondary | ICD-10-CM | POA: Diagnosis not present

## 2021-02-10 LAB — GLUCOSE, CAPILLARY
Glucose-Capillary: 234 mg/dL — ABNORMAL HIGH (ref 70–99)
Glucose-Capillary: 212 mg/dL — ABNORMAL HIGH (ref 70–99)
Glucose-Capillary: 160 mg/dL — ABNORMAL HIGH (ref 70–99)
Glucose-Capillary: 129 mg/dL — ABNORMAL HIGH (ref 70–99)
Glucose-Capillary: 132 mg/dL — ABNORMAL HIGH (ref 70–99)

## 2021-02-10 LAB — BASIC METABOLIC PANEL
Anion gap: 11 (ref 5–15)
BUN: 19 mg/dL (ref 8–23)
CO2: 17 mmol/L — ABNORMAL LOW (ref 22–32)
Calcium: 8.1 mg/dL — ABNORMAL LOW (ref 8.9–10.3)
Chloride: 110 mmol/L (ref 98–111)
Creatinine, Ser: 1.39 mg/dL — ABNORMAL HIGH (ref 0.61–1.24)
GFR, Estimated: 56 mL/min — ABNORMAL LOW (ref >60)
Glucose, Bld: 233 mg/dL — ABNORMAL HIGH (ref 70–99)
Potassium: 3.4 mmol/L — ABNORMAL LOW (ref 3.5–5.1)
Sodium: 138 mmol/L (ref 135–145)

## 2021-02-10 LAB — CBC
HCT: 27.6 % — ABNORMAL LOW (ref 39.0–52.0)
Hemoglobin: 9.1 g/dL — ABNORMAL LOW (ref 13.0–17.0)
MCH: 33.2 pg (ref 26.0–34.0)
MCHC: 33 g/dL (ref 30.0–36.0)
MCV: 100.7 fL — ABNORMAL HIGH (ref 80.0–100.0)
Platelets: 278 10*3/uL (ref 150–400)
RBC: 2.74 MIL/uL — ABNORMAL LOW (ref 4.22–5.81)
RDW: 13.7 % (ref 11.5–15.5)
WBC: 4.7 10*3/uL (ref 4.0–10.5)
nRBC: 0 % (ref 0.0–0.2)

## 2021-02-10 LAB — PHOSPHORUS: Phosphorus: 2.7 mg/dL (ref 2.5–4.6)

## 2021-02-10 LAB — MAGNESIUM: Magnesium: 1.8 mg/dL (ref 1.7–2.4)

## 2021-02-10 MED ORDER — POTASSIUM CHLORIDE CRYS ER 20 MEQ PO TBCR
40.0000 meq | EXTENDED_RELEASE_TABLET | ORAL | Status: AC
  Administered 2021-02-10 (×2): 40 meq via ORAL
  Filled 2021-02-10: qty 2

## 2021-02-10 MED ORDER — POTASSIUM CHLORIDE CRYS ER 20 MEQ PO TBCR
40.0000 meq | EXTENDED_RELEASE_TABLET | Freq: Once | ORAL | Status: AC
  Administered 2021-02-10: 40 meq via ORAL
  Filled 2021-02-10: qty 2

## 2021-02-10 NOTE — TOC Progression Note (Signed)
Transition of Care [facility]) - Progression Note    Patient Details  Name: John Reilly MRN: [id number] Date of Birth: 16-Mar-1956  [facility] CM/SW Contact  Beverly Sessions, RN Phone Number: 02/10/2021, 10:23 AM  Clinical Narrative:     Plan remains for patient to return to [facility] 10/7  Expected Discharge Plan: [place] Barriers to Discharge: Continued Medical Work up, SNF Pending bed offer  Expected Discharge Plan and Services Expected Discharge Plan: [place] In-house Referral: Clinical Social Work   Post Acute Care Choice: [place] ([facility])                                         Social Determinants of Health (SDOH) Interventions    Readmission Risk Interventions No flowsheet data found.

## 2021-02-10 NOTE — Progress Notes (Signed)
PROGRESS NOTE    John Reilly   BJY:[id number]  DOB: 1956-03-31  DOA: 02/01/2021 PCP: Adin Hector, MD   Brief Narrative:  John Reilly is a 65 year old male with type 1 diabetes, COPD, hypertension, alcohol abuse, Parkinson's disease, hyperlipidemia who presented from a skilled nursing facility to the ED on 9/26 with confusion.    In the ED, noted to be in DKA and septic shock secondary to suspected pneumonia.  Admitted to the ICU, started on broad-spectrum antibiotics, Levophed and insulin infusions. His condition improved to be transferred out of the ICU and to the triad hospitalist service today.  Patient was recently hospitalized from 9/12 through 9/20 and he was found hypoglycemic and unresponsive by his wife.  In the hospital he was treated for sepsis secondary to aspiration pneumonia.  He was discharged to skilled nursing facility.  Subjective: She has no new complaints today.  He is adamant about getting out of bed and having the chance to sit up in a chair.    Assessment & Plan:    Principal problem: Septic shock secondary to COVID-19 related pneumonia and possibly aspiration pneumonia -Chest x-ray on 9/26 revealed worsening opacity in the left base and persistent hazy opacities in the right lower lobe - treated with Remdesivir and Cefepime -blood cultures have remained negative- vancomycin discontinued -Of note, C. difficile PCR is positive and antigen is positive but toxin is negative-watery stools have resolved and were likely secondary to COVID-19 infection  Active Problems:  Right sided weakness and right facial droop - noted on 10/1> MRI ordered and was negative for an acute CVA  AKI, anion gap metabolic acidosis, hyponatremia - Baseline creatinine is about 0.8 2.9 - Admitted with creatinine of 2.10 which has steadily improved to normal - Urine output has been poor as patient is third spacing fluids - Cr up to 1.27 today- continue to  follow  Hypokalemia and hypophosphatemia -will continue to replace and recheck -Magnesium level is normal  Hypoalbuminemia with anasarca/third spacing of fluids - offer nutritional supplements- elevate extremities- discussed with RN  Acute metabolic encephalopathy - Secondary to sepsis - Has resolved  Diarrhea - Related to COVID-19 infection but also may be due to Creon not being resumed - - stool is beginning to solidify- he is eating solid food quite well.  Orthostatic hypotension - ?  If related to Parkinson's disease - Receiving Florinef and Midodrine -Continue TED hose especially when getting the patient out of bed -I was in the room and helped with standing him up-upon standing, his blood pressure dropped significantly however, he remains asymptomatic.  In fact he was able to stand for 3 to 5 minutes and then transition to a chair with assistance - I have recommend that we continue to transfer him from bed to chair daily and begin to work on ambulation  Diabetes mellitus type 1 with DKA followed by hypoglycemia -DKA resolved-  -Hemoglobin A1C 5.2 -Home medication includes Lantus and Humalog- - Hypoglycemia occurred on 9/29> dropped Lantus dose of 4 U and reduced Humolog sliding scale- following oral intake and glucose levels -As hemoglobin A1c is well controlled and Lantus dose is low, I feel that we can safely transition him to an oral regimen  - will start Tradjenta 5 mg daily- can continue a low dose SSI  COPD exacerbation? -Per critical care notes, they felt the patient had a COPD exacerbation on admission - No wheezing noted at this time-  Hypokalemia - Likely secondary to  diarrhea - Continue to replace aggressively - Magnesium 2.0  Normocytic anemia - Continue to follow  Pancreatic insufficiency - continue Creon  Parkinson's disease - Continue Sinemet  Alzheimer's dementia - Continue Exelon- overall he is very oriented to time place and  situation  Severe protein calorie malnutrition - Continue dietary supplements  Severe deconditioning - cont to work with PT- extremely weak at this tim- able to get out of bed with 2 person assist      Time spent in minutes: 35 DVT prophylaxis: heparin injection 5,000 Units Start: 01/15/2021 1600 SCDs Start: 01/12/2021 1549 Code Status: Full code Family Communication:  Level of Care: Level of care: Progressive Cardiac Disposition Plan:  Status is: Inpatient  Remains inpatient appropriate because:IV treatments appropriate due to intensity of illness or inability to take PO  Dispo: The patient is from: SNF              Anticipated d/c is to: SNF on 10/7 due to isolation period required for COVID 19              Patient currently is not medically stable to d/c.   Difficult to place patient No   Consultants:  critical care service Procedures:   Antimicrobials:  Anti-infectives (From admission, onward)    Start     Dose/Rate Route Frequency Ordered Stop   02/07/21 2200  ceFEPIme (MAXIPIME) 2 g in sodium chloride 0.9 % 100 mL IVPB        2 g 200 mL/hr over 30 Minutes Intravenous Every 12 hours 02/07/21 1432 02/08/21 1052   02/04/21 1600  vancomycin (VANCOREADY) IVPB 750 mg/150 mL  Status:  Discontinued        750 mg 150 mL/hr over 60 Minutes Intravenous Every 24 hours 02/04/21 1112 02/05/21 1252   02/04/21 1000  remdesivir 100 mg in sodium chloride 0.9 % 100 mL IVPB       See Hyperspace for full Linked Orders Report.   100 mg 200 mL/hr over 30 Minutes Intravenous Daily 01/15/2021 1800 02/07/21 0937   02/04/21 0200  ceFEPIme (MAXIPIME) 2 g in sodium chloride 0.9 % 100 mL IVPB  Status:  Discontinued        2 g 200 mL/hr over 30 Minutes Intravenous Every 12 hours 01/20/2021 1558 02/07/21 1432   01/15/2021 1845  remdesivir 200 mg in sodium chloride 0.9% 250 mL IVPB       See Hyperspace for full Linked Orders Report.   200 mg 580 mL/hr over 30 Minutes Intravenous Once 01/24/2021 1800  01/16/2021 2025   01/22/2021 1557  vancomycin variable dose per unstable renal function (pharmacist dosing)  Status:  Discontinued         Does not apply See admin instructions 01/13/2021 1558 02/04/21 1112   01/23/2021 1430  vancomycin (VANCOREADY) IVPB 1500 mg/300 mL        1,500 mg 150 mL/hr over 120 Minutes Intravenous  Once 02/05/2021 1403 02/05/2021 1641   01/18/2021 1415  ceFEPIme (MAXIPIME) 2 g in sodium chloride 0.9 % 100 mL IVPB        2 g 200 mL/hr over 30 Minutes Intravenous  Once 01/20/2021 1403 01/29/2021 1840   01/10/2021 1415  metroNIDAZOLE (FLAGYL) IVPB 500 mg        500 mg 100 mL/hr over 60 Minutes Intravenous  Once 02/02/2021 1403 01/15/2021 1938        Objective: Vitals:   02/09/21 1651 02/09/21 2054 02/10/21 0516 02/10/21 0700  BP: 127/60 128/62 118/67 Marland Kitchen)  91/41  Pulse: 88 79 90 (!) 50  Resp: 18     Temp: 97.8 F (36.6 C) (!) 97.5 F (36.4 C) 98 F (36.7 C) 98.1 F (36.7 C)  TempSrc: Oral Oral Oral Axillary  SpO2: 100% 93% 93% 93%  Weight:      Height:        Intake/Output Summary (Last 24 hours) at 02/10/2021 0912 Last data filed at 02/10/2021 0600 Gross per 24 hour  Intake --  Output 400 ml  Net -400 ml    Filed Weights   01/20/2021 1438 02/05/2021 1715 02/04/21 0545  Weight: 65.1 kg 59.8 kg 61.9 kg    Examination: General exam: Appears comfortable  HEENT: PERRLA, oral mucosa moist, no sclera icterus or thrush Respiratory system: Clear to auscultation. Respiratory effort normal. Cardiovascular system: S1 & S2 heard, regular rate and rhythm Gastrointestinal system: Abdomen soft, non-tender, nondistended. Normal bowel sounds   Central nervous system: Alert and oriented. No focal neurological deficits.severe generalized weakness Extremities: No cyanosis, clubbing or edema Skin: No rashes or ulcers Psychiatry:  Mood & affect appropriate.      Data Reviewed: I have personally reviewed following labs and imaging studies  CBC: Recent Labs  Lab 01/13/2021 1338  02/04/21 0130 02/06/21 0640 02/07/21 0615 02/08/21 0650 02/09/21 0640 02/10/21 0652  WBC 17.4*   < > 7.8 9.2 6.4 5.0 4.7  NEUTROABS 13.6*  --   --   --   --   --   --   HGB 9.0*   < > 8.3* 8.4* 7.8* 7.4* 9.1*  HCT 31.0*   < > 24.6* 25.9* 23.3* 22.4* 27.6*  MCV 114.8*   < > 98.4 99.6 100.0 99.1 100.7*  PLT 491*   < > 244 263 228 236 278   < > = values in this interval not displayed.    Basic Metabolic Panel: Recent Labs  Lab 02/06/21 0640 02/07/21 0615 02/08/21 0650 02/09/21 0640 02/10/21 0652  NA 139 139 139 140 138  K 3.5 3.8 3.3* 3.3* 3.4*  CL 106 109 108 109 110  CO2 27 24 22  19* 17*  GLUCOSE 159* 161* 208* 169* 233*  BUN 19 21 18 21 19   CREATININE 1.16 1.14 1.25* 1.27* 1.39*  CALCIUM 7.7* 7.7* 7.8* 7.9* 8.1*  MG 1.7 2.0 1.7 1.8 1.8  PHOS 2.8 2.5 2.3* 2.1* 2.7    GFR: Estimated Creatinine Clearance: 45.8 mL/min (A) (by C-G formula based on SCr of 1.39 mg/dL (H)). Liver Function Tests: Recent Labs  Lab 02/04/21 0130 02/05/21 0641 02/06/21 0640 02/07/21 0615 02/08/21 0650  AST 30 25 16  14* 12*  ALT 8 9 11 12 12   ALKPHOS 79 65 70 73 68  BILITOT 0.7 0.6 0.9 0.7 1.0  PROT 4.6* 3.9* 4.1* 4.2* 3.9*  ALBUMIN 2.1* 1.8* 1.9* 1.8* 1.7*    No results for input(s): LIPASE, AMYLASE in the last 168 hours. No results for input(s): AMMONIA in the last 168 hours. Coagulation Profile: Recent Labs  Lab 01/13/2021 1338  INR 1.3*    Cardiac Enzymes: No results for input(s): CKTOTAL, CKMB, CKMBINDEX, TROPONINI in the last 168 hours. BNP (last 3 results) No results for input(s): PROBNP in the last 8760 hours. HbA1C: No results for input(s): HGBA1C in the last 72 hours. CBG: Recent Labs  Lab 02/09/21 0813 02/09/21 1218 02/09/21 1649 02/09/21 2043 02/10/21 0817  GLUCAP 184* 191* 163* 141* 234*    Lipid Profile: No results for input(s): CHOL, HDL, LDLCALC, TRIG, CHOLHDL, LDLDIRECT  in the last 72 hours. Thyroid Function Tests: No results for input(s): TSH,  T4TOTAL, FREET4, T3FREE, THYROIDAB in the last 72 hours. Anemia Panel: Recent Labs    02/08/21 0650  FERRITIN 305    Urine analysis:    Component Value Date/Time   COLORURINE YELLOW (A) 01/20/2021 1442   APPEARANCEUR CLOUDY (A) 02/06/2021 1442   LABSPEC 1.016 01/09/2021 1442   PHURINE 5.0 01/13/2021 1442   GLUCOSEU >=500 (A) 01/09/2021 1442   HGBUR SMALL (A) 01/28/2021 1442   BILIRUBINUR NEGATIVE 01/22/2021 1442   KETONESUR 5 (A) 01/13/2021 1442   PROTEINUR 30 (A) 01/24/2021 1442   NITRITE NEGATIVE 01/29/2021 1442   LEUKOCYTESUR NEGATIVE 02/04/2021 1442   Sepsis Labs: @LABRCNTIP (procalcitonin:4,lacticidven:4) ) Recent Results (from the past 240 hour(s))  Resp Panel by RT-PCR (Flu A&B, Covid) Nasopharyngeal Swab     Status: Abnormal   Collection Time: 01/20/2021  2:42 PM   Specimen: Nasopharyngeal Swab; Nasopharyngeal(NP) swabs in vial transport medium  Result Value Ref Range Status   SARS Coronavirus 2 by RT PCR POSITIVE (A) NEGATIVE Final    Comment: RESULT CALLED TO, READ BACK BY AND VERIFIED WITH: SHANNON STARK @1744  01/14/2021 MJU (NOTE) SARS-CoV-2 target nucleic acids are DETECTED.  The SARS-CoV-2 RNA is generally detectable in upper respiratory specimens during the acute phase of infection. Positive results are indicative of the presence of the identified virus, but do not rule out bacterial infection or co-infection with other pathogens not detected by the test. Clinical correlation with patient history and other diagnostic information is necessary to determine patient infection status. The expected result is Negative.  Fact Sheet for Patients: EntrepreneurPulse.com.au  Fact Sheet for Healthcare Providers: IncredibleEmployment.be  This test is not yet approved or cleared by the [place] FDA and  has been authorized for detection and/or diagnosis of SARS-CoV-2 by FDA under an Emergency Use Authorization (EUA).  This EUA  will remain in effect (meaning this test can be u sed) for the duration of  the COVID-19 declaration under Section 564(b)(1) of the Act, 21 U.S.C. section 360bbb-3(b)(1), unless the authorization is terminated or revoked sooner.     Influenza A by PCR NEGATIVE NEGATIVE Final   Influenza B by PCR NEGATIVE NEGATIVE Final    Comment: (NOTE) The Xpert Xpress SARS-CoV-2/FLU/RSV plus assay is intended as an aid in the diagnosis of influenza from Nasopharyngeal swab specimens and should not be used as a sole basis for treatment. Nasal washings and aspirates are unacceptable for Xpert Xpress SARS-CoV-2/FLU/RSV testing.  Fact Sheet for Patients: EntrepreneurPulse.com.au  Fact Sheet for Healthcare Providers: IncredibleEmployment.be  This test is not yet approved or cleared by the [place] FDA and has been authorized for detection and/or diagnosis of SARS-CoV-2 by FDA under an Emergency Use Authorization (EUA). This EUA will remain in effect (meaning this test can be used) for the duration of the COVID-19 declaration under Section 564(b)(1) of the Act, 21 U.S.C. section 360bbb-3(b)(1), unless the authorization is terminated or revoked.  Performed at [facility], [address] [place],  [address]   Blood Culture (routine x 2)     Status: None   Collection Time: 01/18/2021  2:42 PM   Specimen: BLOOD  Result Value Ref Range Status   Specimen Description BLOOD RIGHT ANTECUBITAL  Final   Special Requests   Final    BOTTLES DRAWN AEROBIC AND ANAEROBIC Blood Culture adequate volume   Culture   Final    NO GROWTH 5 DAYS Performed at [facility],  [place], [place] [address]    Report Status 02/08/2021 FINAL  Final  Blood Culture (routine x 2)     Status: None   Collection Time: 02/07/2021  2:42 PM   Specimen: BLOOD  Result Value Ref Range Status   Specimen Description BLOOD BLOOD RIGHT HAND  Final    Special Requests   Final    BOTTLES DRAWN AEROBIC AND ANAEROBIC Blood Culture adequate volume   Culture   Final    NO GROWTH 5 DAYS Performed at [facility], [address], [place], [place] [address]    Report Status 02/08/2021 FINAL  Final  Urine Culture     Status: None   Collection Time: 02/01/2021  2:42 PM   Specimen: In/Out Cath Urine  Result Value Ref Range Status   Specimen Description   Final    IN/OUT CATH URINE Performed at [facility], [address], [place], [place] [address]    Special Requests   Final    NONE Performed at [facility], [address], [place], [place] [address]    Culture   Final    NO GROWTH Performed at [facility], [place] [address], [place], [place] [address]    Report Status 02/04/2021 FINAL  Final  MRSA Next Gen by PCR, Nasal     Status: Abnormal   Collection Time: 01/09/2021  5:17 PM   Specimen: Nasal Mucosa; Nasal Swab  Result Value Ref Range Status   MRSA by PCR Next Gen DETECTED (A) NOT DETECTED Final    Comment: RESULT CALLED TO, READ BACK BY AND VERIFIED WITH: SHANNON STARK @1855  ON 01/27/2021 SKL (NOTE) The GeneXpert MRSA Assay (FDA approved for NASAL specimens only), is one component of a comprehensive MRSA colonization surveillance program. It is not intended to diagnose MRSA infection nor to guide or monitor treatment for MRSA infections. Test performance is not FDA approved in patients less than 90 years old. Performed at [facility], [place], [place] [address]   C Difficile Quick Screen w PCR reflex     Status: Abnormal   Collection Time: 02/01/2021 11:15 PM   Specimen: STOOL  Result Value Ref Range Status   C Diff antigen POSITIVE (A) NEGATIVE Final   C Diff toxin NEGATIVE NEGATIVE Final   C Diff interpretation Results are indeterminate. See PCR results.  Final    Comment: Performed at [facility], [address] [place], [place] [address]  C. Diff by  PCR, Reflexed     Status: Abnormal   Collection Time: 01/18/2021 11:15 PM  Result Value Ref Range Status   Toxigenic C. Difficile by PCR POSITIVE (A) NEGATIVE Final    Comment: Positive for toxigenic C. difficile with little to no toxin production. Only treat if clinical presentation suggests symptomatic illness. Performed at [facility], [address], [place], [place] [address]          Radiology Studies: MR BRAIN WO CONTRAST  Result Date: 02/08/2021 CLINICAL DATA:  Neuro deficit, acute, stroke suspected right arm weakness, right facial droop. EXAM: MRI HEAD WITHOUT CONTRAST TECHNIQUE: Multiplanar, multiecho pulse sequences of the brain and surrounding structures were obtained without intravenous contrast. COMPARISON:  Head CT 01/20/2021 and MRI 12/16/2016 FINDINGS: Brain: There is no evidence of an acute infarct, intracranial hemorrhage, mass, midline shift, or extra-axial fluid collection. T2 hyperintensities in the cerebral white matter bilaterally have likely slightly progressed from 2018 and are nonspecific but compatible  with mild chronic small vessel ischemic disease. There is mild to moderate generalized cerebral atrophy. Vascular: Major intracranial vascular flow voids are preserved. Skull and upper cervical spine: Unremarkable bone marrow signal para Sinuses/Orbits: Unremarkable orbits. Clear paranasal sinuses. Small bilateral mastoid effusions. Other: None. IMPRESSION: 1. No acute intracranial abnormality. 2. Mild chronic small vessel ischemic disease and cerebral atrophy. Electronically Signed   By: Logan Bores M.D.   On: 02/08/2021 13:31      Scheduled Meds:  vitamin C  500 mg Oral Daily   bismuth subsalicylate  30 mL Oral QID   Chlorhexidine Gluconate Cloth  6 each Topical Q0600   feeding supplement  237 mL Oral TID BM   fludrocortisone  200 mcg Oral Daily   folic acid  1 mg Oral Daily   heparin  5,000 Units Subcutaneous Q8H   insulin aspart  0-6 Units  Subcutaneous TID WC   linagliptin  5 mg Oral Daily   lipase/protease/amylase  36,000 Units Oral TID AC   midodrine  5 mg Oral TID WC   multivitamin with minerals  1 tablet Oral Daily   pantoprazole  40 mg Oral Daily   potassium chloride  40 mEq Oral Q4H   rivastigmine  1.5 mg Oral BID   thiamine  100 mg Oral Daily   zinc sulfate  220 mg Oral Daily   Continuous Infusions:     LOS: 7 days      Debbe Odea, MD [facility] Pager: [email or web address] 02/10/2021, 9:12 AM

## 2021-02-10 NOTE — Progress Notes (Signed)
Physical Therapy Treatment Patient Details Name: John Reilly MRN: [id number] DOB: February 22, 1956 Today's Date: 02/10/2021   History of Present Illness Patient is a 65 year old male here from [facility] for worsening AMS, hypoxia, hyperglycemia. Recently treated for pneumonia. Found to have septic shock secondary to COVID-19 related pneumonia and possibly aspiration pneumonia, right side facial weakness and droop. MRI of brain reported no acute intracranial abnormality. PT ordered as patient with severe deconditioning.    PT Comments    Patient agreeable to PT and eager to get up to the chair again. Blood pressure monitored again throughout session. Supine resting blood pressure 122/51. Patient required Max A of one person. No dizziness reported in sitting position. Max A of one person required for squat pivot transfer to recliner chair. Patient then performed 2 partial standing bouts for bed pan (for bowel movement) while seated in chair. Initial blood pressure in the chair was 96/69 and increased to 101/64 after 3-4 minutes. Also noted the edema had improved in RUE since yesterday and patient with increased active movement observed with functional activity. Educated patient again  on importance of UE positioning/elevation for edema management.  PT will continue to follow to maximize independence and decrease caregiver burden. SNF recommended at discharge.    Recommendations for follow up therapy are one component of a multi-disciplinary discharge planning process, led by the attending physician.  Recommendations may be updated based on patient status, additional functional criteria and insurance authorization.  Follow Up Recommendations  SNF     Equipment Recommendations   (to be determined at next level of care)    Recommendations for Other Services       Precautions / Restrictions Precautions Precautions: Fall Precaution Comments: Orthostatic hypotension Restrictions Weight Bearing  Restrictions: No     Mobility  Bed Mobility Overal bed mobility: Needs Assistance Bed Mobility: Sit to Supine     Supine to sit: Max assist     General bed mobility comments: assistance for trunk and BLE support. increased time and effort required for bed mobility. supine blood pressure 121/51 prior to mobilizing    Transfers Overall transfer level: Needs assistance   Transfers: Squat Pivot Transfers     Squat pivot transfers: Max assist     General transfer comment: verbal cues for technique and hand placement. increased time and effort required. once patient was up in recliner chair, patient complete partial stand x 2 for bed pan with bowel movement in bed pan. Max A required for partial sit to stand transfers as well. patient was unable to stand long enough for standing blood pressure. initial BP in chair was 96/69 and increased to 101/64 after 3-4 minutes. patient reports no dizziness with upright activity  Ambulation/Gait             General Gait Details: unable to attempt at this time due to poor standing tolerance   Stairs             Wheelchair Mobility    Modified Rankin (Stroke Patients Only)       Balance                                            Cognition Arousal/Alertness: Awake/alert Behavior During Therapy: Flat affect Overall Cognitive Status: No family/caregiver present to determine baseline cognitive functioning  General Comments: patient able to follow single step commands with increased time      Exercises      General Comments        Pertinent Vitals/Pain Pain Assessment: Faces Faces Pain Scale: Hurts a little bit Pain Location: thoracic and lumbar area posteriorly Pain Descriptors / Indicators: Sore Pain Intervention(s): Limited activity within patient's tolerance    Home Living                      Prior Function            PT Goals  (current goals can now be found in the care plan section) Acute Rehab PT Goals Patient Stated Goal: to go home PT Goal Formulation: With patient Time For Goal Achievement: 02/23/21 Potential to Achieve Goals: Good Progress towards PT goals: Progressing toward goals    Frequency    Min 2X/week      PT Plan Current plan remains appropriate    Co-evaluation              AM-PAC PT "6 Clicks" Mobility   Outcome Measure  Help needed turning from your back to your side while in a flat bed without using bedrails?: A Lot Help needed moving from lying on your back to sitting on the side of a flat bed without using bedrails?: A Lot Help needed moving to and from a bed to a chair (including a wheelchair)?: A Lot Help needed standing up from a chair using your arms (e.g., wheelchair or bedside chair)?: A Lot Help needed to walk in hospital room?: Total Help needed climbing 3-5 steps with a railing? : Total 6 Click Score: 10    End of Session   Activity Tolerance: Patient limited by fatigue Patient left: in chair;with call bell/phone within reach;with chair alarm set Nurse Communication: Mobility status (discussed transfer technique with nurse aide) PT Visit Diagnosis: Unsteadiness on feet (R26.81);Muscle weakness (generalized) (M62.81);Difficulty in walking, not elsewhere classified (R26.2)     Time: 8466-5993 PT Time Calculation (min) (ACUTE ONLY): 45 min  Charges:  $Therapeutic Activity: 38-52 mins                     Minna Merritts, PT, MPT    Percell Locus 02/10/2021, 12:26 PM

## 2021-02-10 NOTE — Progress Notes (Signed)
Inpatient Diabetes Program Recommendations  AACE/ADA: New Consensus Statement on Inpatient Glycemic Control (2015)  Target Ranges:  Prepandial:   less than 140 mg/dL      [facility] postprandial:   less than 180 mg/dL (1-2 hours)      Critically ill patients:  140 - 180 mg/dL  Results for John Reilly, John Reilly (MRN [id number]) as of 02/10/2021 08:00  Ref. Range 02/07/2021 07:58 02/07/2021 12:05 02/07/2021 17:04 02/07/2021 21:34  Glucose-Capillary Latest Ref Range: 70 - 99 mg/dL 175 (H) 179 (H) 158 (H) 93  Results for John Reilly, John Reilly (MRN [id number]) as of 02/10/2021 08:00  Ref. Range 02/08/2021 07:59 02/08/2021 10:42 02/08/2021 13:13 02/08/2021 16:43 02/08/2021 20:55  Glucose-Capillary Latest Ref Range: 70 - 99 mg/dL 214 (H) 257 (H) 198 (H) 201 (H) 139 (H)  Results for John Reilly, John Reilly (MRN [id number]) as of 02/10/2021 08:00  Ref. Range 02/09/2021 08:13 02/09/2021 12:18 02/09/2021 16:49 02/09/2021 20:43  Glucose-Capillary Latest Ref Range: 70 - 99 mg/dL 184 (H) 191 (H) 163 (H) 141 (H)   Admit with Sepsis/ Pneumonia/ DKA/ COVID+   History: T1DM   SNF Meds: Lantus 6 units QHS        Humalog 0-9 units TID with meals per SSI        Humalog 3 units TID with meals for carbohydrate coverage  Current Orders: Novolog 0-6 units TID AC + Hs   Tradjenta 5 mg Daily   MD- Note patient has History of Type 1 diabetes diagnosed 1988 per Endocrinology notes.  Sees Dr. Honor Reilly for diabetes management John Reilly).  Was previously managed on insulin pump prior to being discharged to SNF on 01/28/2021.  Note that patient had Hypoglycemia early AM on 09/29 but has not had any further HYPO events since then and has been getting the Semglee insulin 4 units daily everyday.   I am unsure that stopping the Semglee and starting Tradjenta will adequately cover pt's insulin needs.  Since he has Type 1 diabetes, he needs basal insulin on board to prevent DKA.  Please consider stopping the Tradjenta and restarting the Semglee 4 units Daily May  also consider reaching out to pt's Endocrinologist Dr. Honor Reilly with concerns regarding his diabetes regimen   Just discharged to [facility] 01/28/2021 after admission on 01/20/2021 for Sepsis, Pneumonia, and Severe Hypoglycemia.  Was using Insulin Pump with TSlim pump + Dexcom sensor prior to last admission.  Dexcom sensor was found to be expired during last admission and was not working.  Upon discharge to SNF, Insulin Pump was stopped and pt discharged to SNF with Rxs for Lantus 6 units QHS + Humalog 0-9 units TID with meals per SSI + Humalog 3 units TID with meals for carbohydrate coverage.       --Will follow patient during hospitalization--  Wyn Quaker RN, MSN, CDE Diabetes Coordinator Inpatient Glycemic Control Team Team Pager: [phone] (8a-5p)

## 2021-02-10 NOTE — Consult Note (Signed)
[facility] for Electrolyte Monitoring and Replacement   Recent Labs: Potassium (mmol/L)  Date Value  02/10/2021 3.4 (L)   Magnesium (mg/dL)  Date Value  02/10/2021 1.8   Calcium (mg/dL)  Date Value  02/10/2021 8.1 (L)   Albumin (g/dL)  Date Value  02/08/2021 1.7 (L)   Phosphorus (mg/dL)  Date Value  02/10/2021 2.7   Sodium (mmol/L)  Date Value  02/10/2021 138   Corrected Ca: 9.94 mg/dL  Assessment: 65 y.o. male with a past medical history of type 1 diabetes, COPD, hypertension, and hyperlipidemia who presents with altered mental status and DKA now transitioned to SSI + glargine. Pharmacy has been consulted for electrolyte replacement.   Goal of Therapy:  Electrolytes WNL  Plan:  K 3.4 - will give Kcl 40 mEq x 1 Recheck electrolytes with am labs   Sherilyn Banker, PharmD Clinical Pharmacist 02/10/2021 8:32 AM

## 2021-02-11 ENCOUNTER — Inpatient Hospital Stay: Payer: Medicare Other

## 2021-02-11 DIAGNOSIS — N179 Acute kidney failure, unspecified: Secondary | ICD-10-CM | POA: Diagnosis not present

## 2021-02-11 LAB — BASIC METABOLIC PANEL
Anion gap: 10 (ref 5–15)
BUN: 20 mg/dL (ref 8–23)
CO2: 16 mmol/L — ABNORMAL LOW (ref 22–32)
Calcium: 8 mg/dL — ABNORMAL LOW (ref 8.9–10.3)
Chloride: 115 mmol/L — ABNORMAL HIGH (ref 98–111)
Creatinine, Ser: 1.66 mg/dL — ABNORMAL HIGH (ref 0.61–1.24)
GFR, Estimated: 45 mL/min — ABNORMAL LOW (ref >60)
Glucose, Bld: 230 mg/dL — ABNORMAL HIGH (ref 70–99)
Potassium: 4.1 mmol/L (ref 3.5–5.1)
Sodium: 141 mmol/L (ref 135–145)

## 2021-02-11 LAB — URINALYSIS, COMPLETE (UACMP) WITH MICROSCOPIC
Bilirubin Urine: NEGATIVE
Glucose, UA: NEGATIVE mg/dL
Ketones, ur: 20 mg/dL — AB
Nitrite: NEGATIVE
Protein, ur: 100 mg/dL — AB
Specific Gravity, Urine: 1.015 (ref 1.005–1.030)
WBC, UA: >50 WBC/hpf — ABNORMAL HIGH (ref 0–5)
pH: 5 (ref 5.0–8.0)

## 2021-02-11 LAB — CBC
HCT: 24.5 % — ABNORMAL LOW (ref 39.0–52.0)
Hemoglobin: 7.9 g/dL — ABNORMAL LOW (ref 13.0–17.0)
MCH: 32.2 pg (ref 26.0–34.0)
MCHC: 32.2 g/dL (ref 30.0–36.0)
MCV: 100 fL (ref 80.0–100.0)
Platelets: 265 10*3/uL (ref 150–400)
RBC: 2.45 MIL/uL — ABNORMAL LOW (ref 4.22–5.81)
RDW: 14.6 % (ref 11.5–15.5)
WBC: 5.8 10*3/uL (ref 4.0–10.5)
nRBC: 0 % (ref 0.0–0.2)

## 2021-02-11 LAB — MAGNESIUM: Magnesium: 1.7 mg/dL (ref 1.7–2.4)

## 2021-02-11 LAB — GLUCOSE, CAPILLARY
Glucose-Capillary: 253 mg/dL — ABNORMAL HIGH (ref 70–99)
Glucose-Capillary: 242 mg/dL — ABNORMAL HIGH (ref 70–99)
Glucose-Capillary: 167 mg/dL — ABNORMAL HIGH (ref 70–99)
Glucose-Capillary: 180 mg/dL — ABNORMAL HIGH (ref 70–99)
Glucose-Capillary: 181 mg/dL — ABNORMAL HIGH (ref 70–99)

## 2021-02-11 LAB — PHOSPHORUS: Phosphorus: 2.4 mg/dL — ABNORMAL LOW (ref 2.5–4.6)

## 2021-02-11 MED ORDER — K PHOS MONO-SOD PHOS DI & MONO 155-852-130 MG PO TABS
500.0000 mg | ORAL_TABLET | ORAL | Status: AC
Start: 2021-02-11 — End: 2021-02-11
  Administered 2021-02-11 (×2): 500 mg via ORAL
  Filled 2021-02-11 (×2): qty 2

## 2021-02-11 MED ORDER — FUROSEMIDE 10 MG/ML IJ SOLN
40.0000 mg | Freq: Once | INTRAMUSCULAR | Status: AC
  Administered 2021-02-11: 40 mg via INTRAVENOUS
  Filled 2021-02-11: qty 4

## 2021-02-11 MED ORDER — SODIUM CHLORIDE 0.9 % IV BOLUS
500.0000 mL | Freq: Once | INTRAVENOUS | Status: AC
  Administered 2021-02-11: 500 mL via INTRAVENOUS

## 2021-02-11 MED ORDER — MIDODRINE HCL 5 MG PO TABS
10.0000 mg | ORAL_TABLET | Freq: Three times a day (TID) | ORAL | Status: DC
  Administered 2021-02-11 – 2021-02-12 (×4): 10 mg via ORAL
  Filled 2021-02-11 (×4): qty 2

## 2021-02-11 MED ORDER — INSULIN GLARGINE-YFGN 100 UNIT/ML ~~LOC~~ SOLN
4.0000 [IU] | Freq: Every day | SUBCUTANEOUS | Status: DC
  Filled 2021-02-11: qty 0.04

## 2021-02-11 MED ORDER — CHLORHEXIDINE GLUCONATE CLOTH 2 % EX PADS: 6.0000 | MEDICATED_PAD | Freq: Every day | CUTANEOUS | Status: DC

## 2021-02-11 NOTE — Progress Notes (Signed)
PROGRESS NOTE    John Reilly   TOI:[id number]  DOB: 1955/07/22  DOA: 01/11/2021 PCP: Adin Hector, MD   Brief Narrative:  John Reilly is a 65 year old male with type 1 diabetes, COPD, hypertension, alcohol abuse, Parkinson's disease (?) , hyperlipidemia who presented from a skilled nursing facility to the ED on 9/26 with confusion from a skilled nursing facility.    Patient was recently hospitalized from 9/12 through 9/20 and he was found hypoglycemic and unresponsive by his wife.  In the hospital he was treated for sepsis secondary to aspiration pneumonia.  He was discharged to skilled nursing facility.  In the ED, noted to be in DKA and had septic shock secondary to suspected pneumonia (possibly aspiration pneumonia) and COVID-19 infection.  Admitted to the ICU, started on broad-spectrum antibiotics, Levophed and insulin infusions. His condition improved and he was transferred out of the ICU and to the [facility]ad hospitalist service.  Since I have been seeing him, the patient has had steady improvement in oral intake and and mobility and has been able to get out of bed with help of physical therapy We were waiting for him to complete his COVID isolation.  Before returning to the skilled nursing facility.  Unfortunately, he has become confused today is noted to have increase in his heart rate and also his respiratory rate. I have begun a work-up to look for an underlying infection.  I have called his wife and updated her that we will wait for the UA and consider initiating antibiotics if it is positive for infection.  Subjective: He appears confused to me today.     Assessment & Plan:    Principal problem: Septic shock secondary to COVID-19 related pneumonia and possibly aspiration pneumonia -COVID-positive on 9/26 -Chest x-ray on 9/26 revealed worsening opacity in the left base and persistent hazy opacities in the right lower lobe - treated with Remdesivir and Cefepime -blood  cultures have remained negative- vancomycin discontinued - Cefepime discontinued on 10/1 -Of note, C. difficile PCR was positive and antigen is positive but toxin was negative-watery stools have resolved and were likely secondary to COVID-19 infection  Active Problems: Acute encephalopathy- suspecting infection - acute confusion noted today- interestingly, his foley cath was removed yesterday - also noted is that his RR and HR have gone  up today -?  Recurrent aspiration pneumonia>  a CT of the chest was done w/o contrast (due to rising creatinine) and does not show any infiltrates- - will order UA (clean catch) and a CBC to check the WBC count - of note, he is incontinent of urine and is not able to give a clean catch urine- I have asked RNs to I and O cath him.   Bilateral pleural effusions on CT scan Hypoalbuminemia with anasarca/third spacing of fluids - Albumin 1.7 when last checked - will see if a dose of Lasix 40 mg IV will help today - offer nutritional supplements- - discussed with RN  AKI, anion gap metabolic acidosis, hyponatremia - Baseline creatinine is about 0.8 2.9 - Admitted with creatinine of 2.10 which has steadily improved to normal - patient is third spacing fluids - Cr up to 166  today-   Hypokalemia and hypophosphatemia -will continue to replace and recheck -Magnesium level is normal  Diarrhea - Related to COVID-19 infection but also may be due to Creon not being resumed -has resolved   Orthostatic hypotension - Receiving Florinef and Midodrine -Continue TED hose especially when getting the  patient out of bed -I was in the room and helped with standing him up-upon standing, his blood pressure dropped significantly however, he remains asymptomatic.  In fact he was able to stand for 3 to 5 minutes and then transition to a chair with assistance - I have recommend that we continue to transfer him from bed to chair daily and begin to work on ambulation  Right  sided weakness and right facial droop - noted on exam 10/1> MRI ordered and was negative for an acute CVA- his weakness had resolved but he continues to have generalized weakness  Diabetes mellitus type 1 with DKA followed by hypoglycemia -DKA resolved-  -Hemoglobin A1C 5.2 -Home medication includes Lantus 6 U and Humalog-currently only on Humalog as he became hypoglycemic  - following oral intake which is very poor today - consider resumption of maybe 4 U of Lantus if oral intake improves   COPD exacerbation? -Per critical care notes, they felt the patient had a COPD exacerbation on admission - No wheezing noted at this time-   Normocytic anemia - Continue to follow  Pancreatic insufficiency - continue Creon  Parkinson's disease - Sinemet was held by the ICU team when he was first admitted - I have been following him for 1 wk now- - I have seen no signs of a resting tremor, masked facies or bradykinesia in the hospital- he is able to feed himself- he was able to stand for 5 min at the bedside and then ambulate to the chair with 1 assist (this was despite having orthostatic hypotension - I will continue to hold the Sinemet for now as he appears to be stable w/o it    Alzheimer's dementia - Continue Exelon- overall he has been very oriented to time place and situation until today  Severe protein calorie malnutrition - Continue dietary supplements  Severe deconditioning - cont to work with PT-  can get out of bed with 1 person assist - will return to SNF     Time spent in minutes: 35 DVT prophylaxis: heparin injection 5,000 Units Start: 02/01/2021 1600 SCDs Start: 02/04/2021 1549 Code Status: Full code Family Communication:  Level of Care: Level of care: Progressive Cardiac Disposition Plan:  Status is: Inpatient  Remains inpatient appropriate because:IV treatments appropriate due to intensity of illness or inability to take PO  Dispo: The patient is from: SNF               Anticipated d/c is to: SNF on 10/7 due to isolation period required for COVID 19              Patient currently is not medically stable to d/c.   Difficult to place patient No   Consultants:  critical care service Procedures:   Antimicrobials:  Anti-infectives (From admission, onward)    Start     Dose/Rate Route Frequency Ordered Stop   02/07/21 2200  ceFEPIme (MAXIPIME) 2 g in sodium chloride 0.9 % 100 mL IVPB        2 g 200 mL/hr over 30 Minutes Intravenous Every 12 hours 02/07/21 1432 02/08/21 1052   02/04/21 1600  vancomycin (VANCOREADY) IVPB 750 mg/150 mL  Status:  Discontinued        750 mg 150 mL/hr over 60 Minutes Intravenous Every 24 hours 02/04/21 1112 02/05/21 1252   02/04/21 1000  remdesivir 100 mg in sodium chloride 0.9 % 100 mL IVPB       See Hyperspace for full Linked Orders Report.  100 mg 200 mL/hr over 30 Minutes Intravenous Daily 02/07/2021 1800 02/07/21 0937   02/04/21 0200  ceFEPIme (MAXIPIME) 2 g in sodium chloride 0.9 % 100 mL IVPB  Status:  Discontinued        2 g 200 mL/hr over 30 Minutes Intravenous Every 12 hours 01/11/2021 1558 02/07/21 1432   01/09/2021 1845  remdesivir 200 mg in sodium chloride 0.9% 250 mL IVPB       See Hyperspace for full Linked Orders Report.   200 mg 580 mL/hr over 30 Minutes Intravenous Once 01/21/2021 1800 02/01/2021 2025   01/20/2021 1557  vancomycin variable dose per unstable renal function (pharmacist dosing)  Status:  Discontinued         Does not apply See admin instructions 01/29/2021 1558 02/04/21 1112   01/29/2021 1430  vancomycin (VANCOREADY) IVPB 1500 mg/300 mL        1,500 mg 150 mL/hr over 120 Minutes Intravenous  Once 01/19/2021 1403 01/19/2021 1641   01/16/2021 1415  ceFEPIme (MAXIPIME) 2 g in sodium chloride 0.9 % 100 mL IVPB        2 g 200 mL/hr over 30 Minutes Intravenous  Once 01/16/2021 1403 01/27/2021 1840   01/23/2021 1415  metroNIDAZOLE (FLAGYL) IVPB 500 mg        500 mg 100 mL/hr over 60 Minutes Intravenous  Once 02/06/2021 1403  01/20/2021 1938        Objective: Vitals:   02/10/21 1500 02/10/21 2144 02/11/21 0606 02/11/21 0700  BP: (!) 122/51 120/63 (!) 104/57 (!) 112/51  Pulse: (!) 105 95 (!) 110 (!) 110  Resp:  18 20 18   Temp: 98.1 F (36.7 C) 97.8 F (36.6 C) (!) 97.5 F (36.4 C) 99.7 F (37.6 C)  TempSrc: Axillary Oral  Axillary  SpO2:  97% 97% 96%  Weight:      Height:       No intake or output data in the 24 hours ending 02/11/21 0958  Filed Weights   01/09/2021 1438 01/09/2021 1715 02/04/21 0545  Weight: 65.1 kg 59.8 kg 61.9 kg    Examination: General exam: Laying in bed in no acute distress-he is not eating breakfast and he is clearly confused-appears comfortable  HEENT: PERRLA, oral mucosa moist, no sclera icterus or thrush Respiratory system: Clear to auscultation. Respiratory effort normal. Cardiovascular system: S1 & S2 heard, regular rate and rhythm Gastrointestinal system: Abdomen soft, non-tender, nondistended. Normal bowel sounds   Central nervous system: Alert -he refuses to follow commands today and as mentioned, appears confused  Extremities: No cyanosis, clubbing or edema Skin: No rashes or ulcers Psychiatry:  Mood & affect appropriate.    Data Reviewed: I have personally reviewed following labs and imaging studies  CBC: Recent Labs  Lab 02/06/21 0640 02/07/21 0615 02/08/21 0650 02/09/21 0640 02/10/21 0652  WBC 7.8 9.2 6.4 5.0 4.7  HGB 8.3* 8.4* 7.8* 7.4* 9.1*  HCT 24.6* 25.9* 23.3* 22.4* 27.6*  MCV 98.4 99.6 100.0 99.1 100.7*  PLT 244 263 228 236 443    Basic Metabolic Panel: Recent Labs  Lab 02/07/21 0615 02/08/21 0650 02/09/21 0640 02/10/21 0652 02/11/21 0510  NA 139 139 140 138 141  K 3.8 3.3* 3.3* 3.4* 4.1  CL 109 108 109 110 115*  CO2 24 22 19* 17* 16*  GLUCOSE 161* 208* 169* 233* 230*  BUN 21 18 21 19 20   CREATININE 1.14 1.25* 1.27* 1.39* 1.66*  CALCIUM 7.7* 7.8* 7.9* 8.1* 8.0*  MG 2.0 1.7 1.8 1.8 1.7  PHOS 2.5 2.3* 2.1* 2.7 2.4*     GFR: Estimated Creatinine Clearance: 38.3 mL/min (A) (by C-G formula based on SCr of 1.66 mg/dL (H)). Liver Function Tests: Recent Labs  Lab 02/05/21 0641 02/06/21 0640 02/07/21 0615 02/08/21 0650  AST 25 16 14* 12*  ALT 9 11 12 12   ALKPHOS 65 70 73 68  BILITOT 0.6 0.9 0.7 1.0  PROT 3.9* 4.1* 4.2* 3.9*  ALBUMIN 1.8* 1.9* 1.8* 1.7*    No results for input(s): LIPASE, AMYLASE in the last 168 hours. No results for input(s): AMMONIA in the last 168 hours. Coagulation Profile: No results for input(s): INR, PROTIME in the last 168 hours.  Cardiac Enzymes: No results for input(s): CKTOTAL, CKMB, CKMBINDEX, TROPONINI in the last 168 hours. BNP (last 3 results) No results for input(s): PROBNP in the last 8760 hours. HbA1C: No results for input(s): HGBA1C in the last 72 hours. CBG: Recent Labs  Lab 02/10/21 1151 02/10/21 1643 02/10/21 2140 02/10/21 2243 02/11/21 0755  GLUCAP 212* 160* 129* 132* 253*    Lipid Profile: No results for input(s): CHOL, HDL, LDLCALC, TRIG, CHOLHDL, LDLDIRECT in the last 72 hours. Thyroid Function Tests: No results for input(s): TSH, T4TOTAL, FREET4, T3FREE, THYROIDAB in the last 72 hours. Anemia Panel: No results for input(s): VITAMINB12, FOLATE, FERRITIN, TIBC, IRON, RETICCTPCT in the last 72 hours.  Urine analysis:    Component Value Date/Time   COLORURINE YELLOW (A) 02/04/2021 1442   APPEARANCEUR CLOUDY (A) 01/15/2021 1442   LABSPEC 1.016 01/11/2021 1442   PHURINE 5.0 02/04/2021 1442   GLUCOSEU >=500 (A) 01/11/2021 1442   HGBUR SMALL (A) 01/25/2021 1442   BILIRUBINUR NEGATIVE 02/06/2021 1442   KETONESUR 5 (A) 01/30/2021 1442   PROTEINUR 30 (A) 01/21/2021 1442   NITRITE NEGATIVE 01/19/2021 1442   LEUKOCYTESUR NEGATIVE 01/12/2021 1442   Sepsis Labs: @LABRCNTIP (procalcitonin:4,lacticidven:4) ) Recent Results (from the past 240 hour(s))  Resp Panel by RT-PCR (Flu A&B, Covid) Nasopharyngeal Swab     Status: Abnormal   Collection  Time: 01/22/2021  2:42 PM   Specimen: Nasopharyngeal Swab; Nasopharyngeal(NP) swabs in vial transport medium  Result Value Ref Range Status   SARS Coronavirus 2 by RT PCR POSITIVE (A) NEGATIVE Final    Comment: RESULT CALLED TO, READ BACK BY AND VERIFIED WITH: SHANNON STARK @1744  01/12/2021 MJU (NOTE) SARS-CoV-2 target nucleic acids are DETECTED.  The SARS-CoV-2 RNA is generally detectable in upper respiratory specimens during the acute phase of infection. Positive results are indicative of the presence of the identified virus, but do not rule out bacterial infection or co-infection with other pathogens not detected by the test. Clinical correlation with patient history and other diagnostic information is necessary to determine patient infection status. The expected result is Negative.  Fact Sheet for Patients: EntrepreneurPulse.com.au  Fact Sheet for Healthcare Providers: IncredibleEmployment.be  This test is not yet approved or cleared by the [place] FDA and  has been authorized for detection and/or diagnosis of SARS-CoV-2 by FDA under an Emergency Use Authorization (EUA).  This EUA will remain in effect (meaning this test can be u sed) for the duration of  the COVID-19 declaration under Section 564(b)(1) of the Act, 21 U.S.C. section 360bbb-3(b)(1), unless the authorization is terminated or revoked sooner.     Influenza A by PCR NEGATIVE NEGATIVE Final   Influenza B by PCR NEGATIVE NEGATIVE Final    Comment: (NOTE) The Xpert Xpress SARS-CoV-2/FLU/RSV plus assay is intended as an aid in the diagnosis of influenza from Nasopharyngeal  swab specimens and should not be used as a sole basis for treatment. Nasal washings and aspirates are unacceptable for Xpert Xpress SARS-CoV-2/FLU/RSV testing.  Fact Sheet for Patients: EntrepreneurPulse.com.au  Fact Sheet for Healthcare  Providers: IncredibleEmployment.be  This test is not yet approved or cleared by the [place] FDA and has been authorized for detection and/or diagnosis of SARS-CoV-2 by FDA under an Emergency Use Authorization (EUA). This EUA will remain in effect (meaning this test can be used) for the duration of the COVID-19 declaration under Section 564(b)(1) of the Act, 21 U.S.C. section 360bbb-3(b)(1), unless the authorization is terminated or revoked.  Performed at [facility], [address] [place], [place] [address]   Blood Culture (routine x 2)     Status: None   Collection Time: 01/13/2021  2:42 PM   Specimen: BLOOD  Result Value Ref Range Status   Specimen Description BLOOD RIGHT ANTECUBITAL  Final   Special Requests   Final    BOTTLES DRAWN AEROBIC AND ANAEROBIC Blood Culture adequate volume   Culture   Final    NO GROWTH 5 DAYS Performed at [facility], [address], [place], [place] [address]    Report Status 02/08/2021 FINAL  Final  Blood Culture (routine x 2)     Status: None   Collection Time: 01/29/2021  2:42 PM   Specimen: BLOOD  Result Value Ref Range Status   Specimen Description BLOOD BLOOD RIGHT HAND  Final   Special Requests   Final    BOTTLES DRAWN AEROBIC AND ANAEROBIC Blood Culture adequate volume   Culture   Final    NO GROWTH 5 DAYS Performed at [facility], [address], [place], [place] [address]    Report Status 02/08/2021 FINAL  Final  Urine Culture     Status: None   Collection Time: 01/28/2021  2:42 PM   Specimen: In/Out Cath Urine  Result Value Ref Range Status   Specimen Description   Final    IN/OUT CATH URINE Performed at [facility], [address], [place], [place] [address]    Special Requests   Final    NONE Performed at [facility], [address] [place], [place] [address]    Culture   Final    NO GROWTH Performed at [facility], [place] [address]  [place], [place] [address]    Report Status 02/04/2021 FINAL  Final  MRSA Next Gen by PCR, Nasal     Status: Abnormal   Collection Time: 01/23/2021  5:17 PM   Specimen: Nasal Mucosa; Nasal Swab  Result Value Ref Range Status   MRSA by PCR Next Gen DETECTED (A) NOT DETECTED Final    Comment: RESULT CALLED TO, READ BACK BY AND VERIFIED WITH: SHANNON STARK @1855  ON 01/31/2021 SKL (NOTE) The GeneXpert MRSA Assay (FDA approved for NASAL specimens only), is one component of a comprehensive MRSA colonization surveillance program. It is not intended to diagnose MRSA infection nor to guide or monitor treatment for MRSA infections. Test performance is not FDA approved in patients less than 52 years old. Performed at [facility], [place], [place] [address]   C Difficile Quick Screen w PCR reflex     Status: Abnormal   Collection Time: 01/09/2021 11:15 PM   Specimen: STOOL  Result Value Ref Range Status   C Diff antigen POSITIVE (A) NEGATIVE Final   C Diff toxin NEGATIVE NEGATIVE Final   C Diff interpretation  Results are indeterminate. See PCR results.  Final    Comment: Performed at [facility], [address] [place], [place] [address]  C. Diff by PCR, Reflexed     Status: Abnormal   Collection Time: 01/24/2021 11:15 PM  Result Value Ref Range Status   Toxigenic C. Difficile by PCR POSITIVE (A) NEGATIVE Final    Comment: Positive for toxigenic C. difficile with little to no toxin production. Only treat if clinical presentation suggests symptomatic illness. Performed at [facility], [address], [place], [place] [address]          Radiology Studies: No results found.    Scheduled Meds:  vitamin C  500 mg Oral Daily   bismuth subsalicylate  30 mL Oral QID   feeding supplement  237 mL Oral TID BM   fludrocortisone  200 mcg Oral Daily   folic acid  1 mg Oral Daily   heparin  5,000 Units Subcutaneous Q8H   insulin aspart  0-6 Units  Subcutaneous TID WC   lipase/protease/amylase  36,000 Units Oral TID AC   midodrine  5 mg Oral TID WC   multivitamin with minerals  1 tablet Oral Daily   pantoprazole  40 mg Oral Daily   phosphorus  500 mg Oral Q4H   rivastigmine  1.5 mg Oral BID   thiamine  100 mg Oral Daily   zinc sulfate  220 mg Oral Daily   Continuous Infusions:     LOS: 8 days      Debbe Odea, MD [facility] Pager: [email or web address] 02/11/2021, 9:58 AM

## 2021-02-11 NOTE — Progress Notes (Signed)
[facility]  HR and RR have been elevated today. He is confused. I asked for a stat UA this AM via an I and O cath as the patient is incontinent. There is yet to be a UA sent as I and O cath has been difficult. Waiting on this before starting antibiotics.   Debbe Odea, MD

## 2021-02-11 NOTE — Progress Notes (Signed)
Mobility Specialist - Progress Note   02/11/21 1300  Mobility  Activity Transferred:  Bed to chair  Level of Assistance Maximum assist, patient does 25-49%  Assistive Device Front wheel walker  Distance Ambulated (ft) 2 ft  Mobility Out of bed to chair with meals  Mobility Response Tolerated well  Mobility performed by Mobility specialist  $Mobility charge 1 Mobility    Pt transferred bed-chair with maxA for all mobility. Unable to come into complete standing---SPT with RW. Supine BP (128/58) asymptomatic. Post BP (136/65). Alarm set.    Kathee Delton Mobility Specialist 02/11/21, 1:12 PM

## 2021-02-11 NOTE — Consult Note (Signed)
PHARMACY CONSULT NOTE  Pharmacy Consult for Electrolyte Monitoring and Replacement   Recent Labs: Potassium (mmol/L)  Date Value  02/11/2021 4.1   Magnesium (mg/dL)  Date Value  02/11/2021 1.7   Calcium (mg/dL)  Date Value  02/11/2021 8.0 (L)   Albumin (g/dL)  Date Value  02/08/2021 1.7 (L)   Phosphorus (mg/dL)  Date Value  02/11/2021 2.4 (L)   Sodium (mmol/L)  Date Value  02/11/2021 141   Corrected Ca: 9.94 mg/dL  Assessment: 66 y.o. male with a past medical history of type 1 diabetes, COPD, hypertension, and hyperlipidemia who presents with altered mental status and DKA now transitioned to SSI + glargine. Pharmacy has been consulted for electrolyte replacement.   Goal of Therapy:  Electrolytes WNL  Plan:  Will order Kphos 2 tabs x 2.  Recheck electrolytes with am labs   Oswald Hillock, PharmD Clinical Pharmacist 02/11/2021 7:53 AM

## 2021-02-11 NOTE — Progress Notes (Signed)
Mobility Specialist - Progress Note   02/11/21 1307  Mobility  Activity Transferred:  Chair to bed  Level of Assistance Maximum assist, patient does 25-49%  Assistive Device Front wheel walker  Distance Ambulated (ft) 2 ft  Mobility Sit up in bed/chair position for meals  Mobility Response Tolerated well  Mobility performed by Mobility specialist  $Mobility charge 1 Mobility    Pt scoot transfer chair-bed with maxA.    Kathee Delton Mobility Specialist 02/11/21, 1:13 PM

## 2021-02-11 NOTE — Progress Notes (Signed)
Inpatient Diabetes Program Recommendations  AACE/ADA: New Consensus Statement on Inpatient Glycemic Control   Target Ranges:  Prepandial:   less than 140 mg/dL      Peak postprandial:   less than 180 mg/dL (1-2 hours)      Critically ill patients:  140 - 180 mg/dL  Results for John, Reilly (MRN [id number]) as of 02/11/2021 10:56  Ref. Range 02/11/2021 07:55  Glucose-Capillary Latest Ref Range: 70 - 99 mg/dL 253 (H)  Novolog 3 units   Results for John, Reilly (MRN [id number]) as of 02/11/2021 10:56  Ref. Range 02/10/2021 08:17 02/10/2021 11:51 02/10/2021 16:43 02/10/2021 21:40 02/10/2021 22:43  Glucose-Capillary Latest Ref Range: 70 - 99 mg/dL 234 (H)  Novolog 2 units  Tradjenta 5 mg 212 (H)  Novolog 2 units 160 (H)  Novolog 1 units 129 (H) 132 (H)  Results for John, Reilly (MRN [id number]) as of 02/11/2021 10:56  Ref. Range 10/19/2017 08:04  INSULIN Latest Ref Range: 2.6 - 24.9 uIU/mL <0.2 (L)  C-Peptide Latest Ref Range: 1.1 - 4.4 ng/mL <0.1 (L)   Review of Glycemic Control  Admit with Sepsis/ Pneumonia/ DKA/ COVID+   History: T1DM   SNF Meds: Lantus 6 units QHS        Humalog 0-9 units TID with meals per SSI        Humalog 3 units TID with meals for carbohydrate coverage Current orders for Inpatient glycemic control: Novolog 0-6 units TID with meals  Inpatient Diabetes Program Recommendations:    Insulin: Please order Semglee 4 units daily starting now.  NOTE: Patient has Type 1 DM and sees Dr. Honor Junes (Endocrinologist) for DM management. Per chart, patient last seen Dr. Honor Junes on 11/08/20 and per office note patient was on an insulin pump with the following settings:  He is on the T slim control IQ pump:  Manual mode basal rates Midnight = 0.35 3 AM = 0.275  6 AM = 0.275 2 PM = 0.35 4 PM = 0.55 6:30 PM = 0.45 TDD basal: 8.6 units/day  Bolus settings I/C: 25 (1 unit covers 25 grams of carbs) ISF: 80 (1 unit drops glucose 80 mg/dl) Target Glucose: 120  mg/dl Active insulin time: 5 hours Patient is very sensitive to insulin and requires basal, correction, and meal coverage insulin as he makes NO insulin at all.   Thanks, Barnie Alderman, RN, MSN, CDE Diabetes Coordinator Inpatient Diabetes Program [phone] (Team Pager from 8am to 5pm)

## 2021-02-11 NOTE — Progress Notes (Addendum)
Speech Language Pathology Treatment: Dysphagia  Patient Details Name: John Reilly MRN: [id number] DOB: 21-Jan-1956 Today's Date: 02/11/2021 Time: 3474-2595 SLP Time Calculation (min) (ACUTE ONLY): 45 min  Assessment / Plan / Recommendation Clinical Impression  Pt seen for ongoing toleration of diet; assessment of swallow function during this time of illness. Pt is COVID+. Noted Chest CT results today - declined presentation overall. Pt has refused to eat per chart notes.  Upon entering room, pt exhibited a congested cough intermittently. He required MOD++ cues for sitting upright in bed and assisting in this w/ this Clinician. Pt has a Baseline of Dementia.  He appears to present w/ oropharyngeal phase dysphagia in light of declined mental status/Dementia, weakness, and Pulmonary decline. These factors can impact his overall awareness/engagement and safety during po tasks which increases risk for aspiration. Pt's risk for aspiration is present but reduced when following general aspiration precautions and using a modified diet including Nectar consistency liquids. He requires MOD++ tactile/verbal/visual cues for orientation to bolus tasks, and during feeding support(he often paused and required redirection). Pt consumed trials of ice chips, purees, and soft solids w/ trials of thin and Nectar liquids. Delayed, congested coughing was noted moreso w/ thin liquids -- pt exhibited a congested cough at baseline -- difficult to discern from cough post thin liquids. The effort of the cough appeared less w/ the Nectar liquids. Similar cough was noted x2 w/ food trials as well. Pt required encouragement for all trials stating initially that he "did not want anything right now". No overt decline in respiratory status during/post trials(O2 sats ~95%). Laryngeal excursion was adequate. Oral phase was grossly [facility] for bolus management, timely A-P transfer, and oral clearing of the boluses given. Nectar consistency  liquids appeared to provide increased bolus control and management during the swallowing. Pt seemed easily distracted during this tx session.   D/t pt's declined Cognitive status/Dementia, and his risk for aspiration, recommend the dysphagia level 3(mech soft) w/ Nectar liquids; general aspiration precautions; reduce Distractions during meals. Pills Crushed in Puree for safer swallowing. Support w/ feeding at meals -- check for oral clearing during/post intake. MD/NSG updated. ST services will continue to follow for toleration of diet upgrade as appropriate. When able d/t COVID+ restrictions, recommend f/u w/ MBSS.       HPI HPI: Pt  is a 65 y.o. male with medical history significant of Alzheimer's Dementia per chart, hypoglycimic events, ETOH abuse, HTN, type 1 diabetes on insulin pump, COPD, GERD, PVD, Parkinson's disease, orthostatic hypotension, heavy drinker, who presents with altered mental status. At his recent admit, wife found pt unresponsive with altered mental status; his blood sugar at low at 20s. He was admitted and treated at [facility] from 01/20/21 through 01/28/21 for Severe Sepsis in the setting of aspiration pneumonia(Wife gave oral med for his blood sugar, and pt was Not swallowing per her report then), and was discharged to [facility].  At this admit, he is admitted with severe DKA and septic shock due to to suspected healthcare associated pneumonia vs. aspiration pneumonia.  Pt has not been able to participate in PT at [facility] per the Family d/t blood pressure problems when attempting to stand.   CXR: Worsening opacity in the left base with suspected trace left  pleural effusion raises suspicion for developing left lower lobe  pneumonia.  2. Persistent hazy opacities in the right lower lobe with a small  right pleural effusion suggesting ongoing right basilar infection.  MRI - negative for  acute event.      SLP Plan  Continue with current plan of care (monitor status durig  COVID+)      Recommendations for follow up therapy are one component of a multi-disciplinary discharge planning process, led by the attending physician.  Recommendations may be updated based on patient status, additional functional criteria and insurance authorization.    Recommendations  Diet recommendations: Dysphagia 3 (mechanical soft);Nectar-thick liquid Liquids provided via: Cup;Straw (monitor) Medication Administration: Whole meds with puree (vs CRUSHED if needed for safer swallowing) Supervision: Staff to assist with self feeding;Full supervision/cueing for compensatory strategies Compensations: Minimize environmental distractions;Slow rate;Small sips/bites;Lingual sweep for clearance of pocketing;Multiple dry swallows after each bite/sip;Follow solids with liquid Postural Changes and/or Swallow Maneuvers: Out of bed for meals;Seated upright 90 degrees;Upright 30-60 min after meal                General recommendations:  (Dietician f/u; Palliative Care f/u for [place]) Oral Care Recommendations: Oral care BID;Oral care before and after PO;Staff/trained caregiver to provide oral care Follow up Recommendations: Skilled Nursing facility (tbd) SLP Visit Diagnosis: Dysphagia, oropharyngeal phase (R13.12) Plan: Continue with current plan of care (monitor status durig COVID+)       GO                 John Kenner, MS, CCC-SLP Speech Language Pathologist Rehab Services [phone] [facility]  02/11/2021, 5:58 PM

## 2021-02-12 ENCOUNTER — Inpatient Hospital Stay: Payer: Medicare Other

## 2021-02-12 DIAGNOSIS — E0811 Diabetes mellitus due to underlying condition with ketoacidosis with coma: Secondary | ICD-10-CM | POA: Diagnosis not present

## 2021-02-12 LAB — BLOOD GAS, ARTERIAL
Acid-base deficit: 9.9 mmol/L — ABNORMAL HIGH (ref 0.0–2.0)
Acid-base deficit: 8.6 mmol/L — ABNORMAL HIGH (ref 0.0–2.0)
Bicarbonate: 20.4 mmol/L (ref 20.0–28.0)
Bicarbonate: 19.7 mmol/L — ABNORMAL LOW (ref 20.0–28.0)
FIO2: 100
FIO2: 100
MECHVT: 470 mL
Mechanical Rate: 20
O2 Saturation: 86 %
O2 Saturation: 95.8 %
PEEP: 8 cmH2O
Patient temperature: 37
Patient temperature: 37
pCO2 arterial: 72 mmHg (ref 32.0–48.0)
pCO2 arterial: 54 mmHg — ABNORMAL HIGH (ref 32.0–48.0)
pH, Arterial: 7.06 — CL (ref 7.350–7.450)
pH, Arterial: 7.17 — CL (ref 7.350–7.450)
pO2, Arterial: 73 mmHg — ABNORMAL LOW (ref 83.0–108.0)
pO2, Arterial: 100 mmHg (ref 83.0–108.0)

## 2021-02-12 LAB — CBC
HCT: 25.8 % — ABNORMAL LOW (ref 39.0–52.0)
Hemoglobin: 8.4 g/dL — ABNORMAL LOW (ref 13.0–17.0)
MCH: 33.2 pg (ref 26.0–34.0)
MCHC: 32.6 g/dL (ref 30.0–36.0)
MCV: 102 fL — ABNORMAL HIGH (ref 80.0–100.0)
Platelets: 278 10*3/uL (ref 150–400)
RBC: 2.53 MIL/uL — ABNORMAL LOW (ref 4.22–5.81)
RDW: 14.6 % (ref 11.5–15.5)
WBC: 4.8 10*3/uL (ref 4.0–10.5)
nRBC: 0 % (ref 0.0–0.2)

## 2021-02-12 LAB — MAGNESIUM: Magnesium: 1.7 mg/dL (ref 1.7–2.4)

## 2021-02-12 LAB — GLUCOSE, CAPILLARY
Glucose-Capillary: 329 mg/dL — ABNORMAL HIGH (ref 70–99)
Glucose-Capillary: 345 mg/dL — ABNORMAL HIGH (ref 70–99)
Glucose-Capillary: 124 mg/dL — ABNORMAL HIGH (ref 70–99)
Glucose-Capillary: 201 mg/dL — ABNORMAL HIGH (ref 70–99)
Glucose-Capillary: 162 mg/dL — ABNORMAL HIGH (ref 70–99)

## 2021-02-12 LAB — BASIC METABOLIC PANEL
Anion gap: 14 (ref 5–15)
BUN: 23 mg/dL (ref 8–23)
CO2: 16 mmol/L — ABNORMAL LOW (ref 22–32)
Calcium: 8.3 mg/dL — ABNORMAL LOW (ref 8.9–10.3)
Chloride: 111 mmol/L (ref 98–111)
Creatinine, Ser: 1.83 mg/dL — ABNORMAL HIGH (ref 0.61–1.24)
GFR, Estimated: 40 mL/min — ABNORMAL LOW (ref >60)
Glucose, Bld: 298 mg/dL — ABNORMAL HIGH (ref 70–99)
Potassium: 3.4 mmol/L — ABNORMAL LOW (ref 3.5–5.1)
Sodium: 141 mmol/L (ref 135–145)

## 2021-02-12 LAB — LACTIC ACID, PLASMA: Lactic Acid, Venous: 1.1 mmol/L (ref 0.5–1.9)

## 2021-02-12 LAB — PHOSPHORUS: Phosphorus: 3.7 mg/dL (ref 2.5–4.6)

## 2021-02-12 MED ORDER — CARBIDOPA-LEVODOPA 25-250 MG PO TABS
1.0000 | ORAL_TABLET | Freq: Four times a day (QID) | ORAL | Status: DC
  Administered 2021-02-12: 1 via ORAL
  Filled 2021-02-12 (×3): qty 1

## 2021-02-12 MED ORDER — MIRTAZAPINE 15 MG PO TABS: 15.0000 mg | ORAL_TABLET | Freq: Every day | ORAL | Status: DC

## 2021-02-12 MED ORDER — SODIUM BICARBONATE 8.4 % IV SOLN
100.0000 meq | Freq: Once | INTRAVENOUS | Status: AC
  Administered 2021-02-12: 100 meq via INTRAVENOUS
  Filled 2021-02-12: qty 50

## 2021-02-12 MED ORDER — CARBIDOPA-LEVODOPA ER 50-200 MG PO TBCR
1.0000 | EXTENDED_RELEASE_TABLET | Freq: Every day | ORAL | Status: DC
  Filled 2021-02-12: qty 1

## 2021-02-12 MED ORDER — POTASSIUM CHLORIDE CRYS ER 20 MEQ PO TBCR
40.0000 meq | EXTENDED_RELEASE_TABLET | Freq: Once | ORAL | Status: AC
  Administered 2021-02-12: 40 meq via ORAL
  Filled 2021-02-12: qty 2

## 2021-02-12 MED ORDER — ROCURONIUM BROMIDE 10 MG/ML (PF) SYRINGE
PREFILLED_SYRINGE | INTRAVENOUS | Status: AC
  Administered 2021-02-13: 50 mg via INTRAVENOUS
  Filled 2021-02-12: qty 10

## 2021-02-12 MED ORDER — INSULIN GLARGINE-YFGN 100 UNIT/ML ~~LOC~~ SOLN
3.0000 [IU] | Freq: Every day | SUBCUTANEOUS | Status: DC
  Administered 2021-02-12: 3 [IU] via SUBCUTANEOUS
  Filled 2021-02-12 (×2): qty 0.03

## 2021-02-12 MED ORDER — CARBIDOPA-LEVODOPA 25-250 MG PO TABS
1.0000 | ORAL_TABLET | Freq: Four times a day (QID) | ORAL | Status: DC
  Administered 2021-02-12: 1
  Filled 2021-02-12 (×4): qty 1

## 2021-02-12 MED ORDER — CIPROFLOXACIN HCL 500 MG PO TABS
500.0000 mg | ORAL_TABLET | Freq: Two times a day (BID) | ORAL | Status: DC
  Filled 2021-02-12: qty 1

## 2021-02-12 MED ORDER — MAGNESIUM SULFATE 2 GM/50ML IV SOLN
2.0000 g | Freq: Once | INTRAVENOUS | Status: AC
  Administered 2021-02-12: 2 g via INTRAVENOUS
  Filled 2021-02-12: qty 50

## 2021-02-12 MED ORDER — NOREPINEPHRINE 4 MG/250ML-% IV SOLN
INTRAVENOUS | Status: AC
  Administered 2021-02-12: 4 mg
  Filled 2021-02-12: qty 250

## 2021-02-12 MED ORDER — ETOMIDATE 2 MG/ML IV SOLN
INTRAVENOUS | Status: AC
  Administered 2021-02-13: 20 mg via INTRAVENOUS
  Filled 2021-02-12: qty 20

## 2021-02-12 MED ORDER — CIPROFLOXACIN HCL 500 MG PO TABS
500.0000 mg | ORAL_TABLET | Freq: Two times a day (BID) | ORAL | Status: DC
  Administered 2021-02-12: 500 mg via ORAL
  Filled 2021-02-12 (×2): qty 1

## 2021-02-12 MED ORDER — FENTANYL CITRATE PF 50 MCG/ML IJ SOSY
PREFILLED_SYRINGE | INTRAMUSCULAR | Status: AC
  Administered 2021-02-13: 50 ug via INTRAVENOUS
  Filled 2021-02-12: qty 2

## 2021-02-12 MED ORDER — PROPOFOL 1000 MG/100ML IV EMUL
INTRAVENOUS | Status: AC
  Administered 2021-02-13: 30 ug/kg/min via INTRAVENOUS
  Filled 2021-02-12: qty 100

## 2021-02-12 MED ORDER — RIVASTIGMINE TARTRATE 1.5 MG PO CAPS
1.5000 mg | ORAL_CAPSULE | Freq: Two times a day (BID) | ORAL | Status: DC
  Filled 2021-02-12 (×2): qty 1

## 2021-02-12 NOTE — Consult Note (Addendum)
[place] for Electrolyte Monitoring and Replacement   Recent Labs: Potassium (mmol/L)  Date Value  02/12/2021 3.4 (L)   Magnesium (mg/dL)  Date Value  02/12/2021 1.7   Calcium (mg/dL)  Date Value  02/12/2021 8.3 (L)   Albumin (g/dL)  Date Value  02/08/2021 1.7 (L)   Phosphorus (mg/dL)  Date Value  02/12/2021 3.7   Sodium (mmol/L)  Date Value  02/12/2021 141    Assessment: 65 y.o. male with a past medical history of type 1 diabetes, COPD, hypertension, and hyperlipidemia who presents with altered mental status and DKA now transitioned to SSI + glargine. Pharmacy has been consulted for electrolyte replacement. Scr is trending up.   Goal of Therapy:  Electrolytes WNL  Plan:  Will give Mg 2 g IV x 1 and Kcl 40 mEq x 1 PO.  Recheck electrolytes with am labs   Oswald Hillock, PharmD Clinical Pharmacist 02/12/2021 8:00 AM

## 2021-02-12 NOTE — Plan of Care (Signed)

## 2021-02-12 NOTE — Progress Notes (Signed)
Rapid Response Event Note   Reason for Call : Resp. Distress/ unresponsive   Initial Focused Assessment: Pt being suctioned when I entered room. Pt responds to pain only. O2 sats 87% on nonrebreather. RT at bedside. Family at bedside. BP stable. Tachycardic.    Interventions: ABG drawn by RT. Pt suctioned orally. Nonrebreather in place. Sharion Settler, NP notified of rapid.   Plan of Care: Transfer patient to ICU for intubation.   Event Summary: Pt transferred to ICU 19.  MD Notified: Sharion Settler, NP Call Time: [phone] Arrival Time: 1924 End Time: Pt transferred to ICU  Trellis Paganini, RN

## 2021-02-12 NOTE — Care Management (Signed)
Spoke with wife via phone.  Patient unable to tolerate oral medications.  I suspect the etiology was the sinemet.  The medication was held for several days prior to my assuming care.  Plan: Place dobhoff Switch immediate release sinemet to per tube Monitor on PCU Vitals currently stable, low threshold for transfer to higher level of care should he decompensate  Ralene Muskrat MD

## 2021-02-12 NOTE — Progress Notes (Signed)
eLink Physician-Brief Progress Note Patient Name: HELMUT HENNON DOB: 08/29/1955 MRN: [id number]   Date of Service  02/12/2021  HPI/Events of Note  Pt in hospital with DKA; became somnolent this evening and went into resp failure  eICU Interventions  - intubated and in ICU - in-house doc is at bedside     Intervention Category Evaluation Type: New Patient Evaluation  Tilden Dome 02/12/2021, 9:28 PM

## 2021-02-12 NOTE — Consult Note (Addendum)
Pharmacy Antibiotic Note  John Reilly is a 65 y.o. male  with PMH of COPD, hypotension, Parkinson's disease, pancreatic insufficiency, EtOH abuse, dementia who was admitted on 01/17/2021 with DKA/COVID+/suspected septic shock secondary to PNA. Pt was treated with broad -spectrum abx ( cefepime x 6 days, vancomycin x 2 days, flagyl x 1 day). Pt has a noted PCN and sulfa allergy reaction of a rash. Pharmacy has been consulted for ciprofloxacin dosing for possible UTI.   (10/4) UA is positive for rare bacteria, >50 WBC, and large leukocytes. Pt is afebrile with a Tmax 98.9 Briefly tachycardic at times 80s - 110s  Hx of external catheter   Plan: Will initiate Ciprofloxacin 500 mg q12h x total of 5 days for complicated UTI. Pt's Scr currently trending up 1.66>1.83. Continue to monitor CrCl and make adjustments as needed.   Height: 5\' 10"  (177.8 cm) Weight: 61.9 kg (136 lb 7.4 oz) IBW/kg (Calculated) : 73  Temp (24hrs), Avg:98.6 F (37 C), Min:97.7 F (36.5 C), Max:98.9 F (37.2 C)  Recent Labs  Lab 02/07/21 0615 02/08/21 0650 02/09/21 0640 02/10/21 0652 02/11/21 0510 02/11/21 1558 02/12/21 0620  WBC 9.2 6.4 5.0 4.7  --  5.8  --   CREATININE 1.14 1.25* 1.27* 1.39* 1.66*  --  1.83*    Estimated Creatinine Clearance: 34.8 mL/min (A) (by C-G formula based on SCr of 1.83 mg/dL (H)).    Allergies  Allergen Reactions   Penicillins Rash and Other (See Comments)    Has patient had a PCN reaction causing immediate rash, facial/tongue/throat swelling, SOB or lightheadedness with hypotension: Yes Has patient had a PCN reaction causing severe rash involving mucus membranes or skin necrosis: No Has patient had a PCN reaction that required hospitalization: No Has patient had a PCN reaction occurring within the last 10 years: No If all of the above answers are "NO", then may proceed with Cephalosporin use. Other reaction(s): RASH Has patient had a PCN reaction causing immediate rash,  facial/tongue/throat swelling, SOB or lightheadedness with hypotension: Yes Has patient had a PCN reaction causing severe rash involving mucus membranes or skin necrosis: No Has patient had a PCN reaction that required hospitalization: No Has patient had a PCN reaction occurring within the last 10 years: No If all of the above answers are "NO", then may proceed with Cephalosporin use. Reaction:  Unknown; childhood reaction  Has patient had a PCN reaction causing immediate rash, facial/tongue/throat swelling, SOB or lightheadedness with hypotension: Yes Has patient had a PCN reaction causing severe rash involving mucus membranes or skin necrosis: No Has patient had a PCN reaction that required hospitalization: No Has patient had a PCN reaction occurring within the last 10 years: No If all of the above answers are "NO", then may proceed with Cephalosporin use.   Sulfa Antibiotics Rash    Katherina Right Syndrome (SJS)     Antimicrobials this admission: Ciprofloxacin 500 mg (10/5>> Vancomycin 1500 mg x 1 dose (9/26) Vancomycin 750 mg x 1 dose (9/27)  Cefepime 2 g (9/26>>10/1)  Flagyl 500 mg x 1 dose (9/26)   Dose adjustments this admission: Vancomycin 1500 mg >> vancomycin 750 mg  Microbiology results: (9/26) BCx: NG x 5 days (10/1) (9/26) UCx: NG (9/27)   (9/26) MRSA PCR: positive   Thank you for allowing pharmacy to be a part of this patient's care.  [place] Jamine Highfill, [place] PharmD Candidate 23'   02/12/2021 11:56 AM

## 2021-02-12 NOTE — Progress Notes (Signed)
Cross Cover Rapid response, patient with decreased responsiveness and low oxygen saturation.  Patient non responsive to noxious stimuli.  Pupils equally round 3 but NR. Atrial flutter with vent rate 120's on my arrival, converted to sinus rhythm. ABG drawn prior to my arrival showing acute respiratory acidosis and hypoxia 7.06, 72-73-20.  Acute care NP notified need for intubation and patient transferred to ICU. Family present at time of event and updated.

## 2021-02-12 NOTE — Progress Notes (Signed)
PT Cancellation Note  Patient Details Name: John Reilly MRN: [id number] DOB: December 31, 1955   Cancelled Treatment:    Reason Eval/Treat Not Completed: Medical issues which prohibited therapy (Per chart review, patient noted with transfer to CCU due to decline in respiratory status; now intubated and sedated.  Will complete PT orders; please reconsult as medically appropriate and available)   Reganne Messerschmidt H. Owens Shark, PT, DPT, NCS 02/12/21, 9:35 PM [phone]

## 2021-02-12 NOTE — Progress Notes (Signed)
Chaplain responded to Rapid Response. Pt COVID +. Continued support/pastoral care available per on call chaplain by telephone.

## 2021-02-12 NOTE — Progress Notes (Signed)
Physical Therapy Treatment Patient Details Name: John Reilly MRN: [id number] DOB: Sep 17, 1955 Today's Date: 02/12/2021   History of Present Illness Patient is a 65 year old male here from [facility] for worsening AMS, hypoxia, hyperglycemia. Recently treated for pneumonia. Found to have septic shock secondary to COVID-19 related pneumonia and possibly aspiration pneumonia, right side facial weakness and droop. MRI of brain reported no acute intracranial abnormality. PT ordered as patient with severe deconditioning.    PT Comments    Pt seen this pm for modified session due to decreased tolerance for activity. Pt received in bed with untouched lunch at bedside. Pt with wet cough and noted lethargy with difficulty keeping eyes open. Pt required increased assistance for repositioning in bed in order to promote pulmonary toileting and prevent skin breakdown. AAROM to LE's to facilitate circulation. Pt with poor participation, nursing notified.  Pt noted to have HR of 122bpm with O2 sats at 96% on RA.    Recommendations for follow up therapy are one component of a multi-disciplinary discharge planning process, led by the attending physician.  Recommendations may be updated based on patient status, additional functional criteria and insurance authorization.  Follow Up Recommendations  SNF     Equipment Recommendations       Recommendations for Other Services OT consult     Precautions / Restrictions Precautions Precautions: Fall Precaution Comments: Orthostatic hypotension     Mobility  Bed Mobility Overal bed mobility: Needs Assistance Bed Mobility: Rolling Rolling: Mod assist;Max assist (with use of rail)         General bed mobility comments:  (MaxA to scoot up in bed, pt unable to assist.)    Transfers                    Ambulation/Gait                 Stairs             Wheelchair Mobility    Modified Rankin (Stroke Patients Only)        Balance                                            Cognition Arousal/Alertness: Lethargic Behavior During Therapy: Flat affect Overall Cognitive Status: No family/caregiver present to determine baseline cognitive functioning Area of Impairment:  (Difficulty keeping eyes open)                               General Comments:  (Pt appears more lethargic than previous treatment session.)      Exercises General Exercises - Lower Extremity Ankle Circles/Pumps: AAROM;15 reps;Both    General Comments        Pertinent Vitals/Pain Pain Assessment: No/denies pain    Home Living                      Prior Function            PT Goals (current goals can now be found in the care plan section) Acute Rehab PT Goals Patient Stated Goal: to go home    Frequency    Min 2X/week      PT Plan Current plan remains appropriate    Co-evaluation  AM-PAC PT "6 Clicks" Mobility   Outcome Measure  Help needed turning from your back to your side while in a flat bed without using bedrails?: A Lot Help needed moving from lying on your back to sitting on the side of a flat bed without using bedrails?: A Lot Help needed moving to and from a bed to a chair (including a wheelchair)?: A Lot Help needed standing up from a chair using your arms (e.g., wheelchair or bedside chair)?: A Lot Help needed to walk in hospital room?: Total Help needed climbing 3-5 steps with a railing? : Total 6 Click Score: 10    End of Session   Activity Tolerance: Patient limited by lethargy Patient left: in bed;with bed alarm set;with call bell/phone within reach   PT Visit Diagnosis: Unsteadiness on feet (R26.81);Muscle weakness (generalized) (M62.81);Difficulty in walking, not elsewhere classified (R26.2)     Time: 1510-1520 PT Time Calculation (min) (ACUTE ONLY): 10 min  Charges:  $Therapeutic Activity: 8-22 mins                    Mikel Cella, PTA   Josie Dixon 02/12/2021, 3:30 PM

## 2021-02-12 NOTE — Progress Notes (Signed)
Tidal volume increased to 550 per NP.

## 2021-02-12 NOTE — Progress Notes (Signed)
[place] Progress Note Patient Name: John Reilly DOB: [birth year] MRN: [id number]   Date of Service  02/12/2021  HPI/Events of Note  S/p intubation  eICU Interventions  CXR shows ETT, OGT, and central venous catheter to all be in appropriate locations     Intervention Category Minor Interventions: Clinical assessment - ordering diagnostic tests  Tilden Dome 02/12/2021, 10:22 PM

## 2021-02-12 NOTE — Progress Notes (Signed)
Went to patient's room to give 1700 medications. Patient seemed very sleeping so I did not give the 3 medications; Midodrine 2, and Sinemet. Patient's spouse was in the room and requested to speak with MD. Durene Cal a chat to MD. Martin Majestic to care for other patients. Left patient resting with eyes closed. Patient had not been eating well.  Returned with Tour manager and found patient breathing hard. Patient's grand-daughter and another male were in the room. Called for a rapid. Checked patient's BS and it was 162. Both charge nurses were in attendant. RRT arrived and we transported patient to the ICU here patient was intubated.

## 2021-02-12 NOTE — Progress Notes (Signed)
.  Via PROGRESS NOTE    John Reilly  YQM:[id number] DOB: 1955-12-13 DOA: 01/15/2021 PCP: Adin Hector, MD    Brief Narrative:  65 year old male with type 1 diabetes, COPD, hypertension, alcohol abuse, Parkinson's disease (?) , hyperlipidemia who presented from a skilled nursing facility to the ED on 9/26 with confusion from a skilled nursing facility.     Patient was recently hospitalized from 9/12 through 9/20 and he was found hypoglycemic and unresponsive by his wife.  In the hospital he was treated for sepsis secondary to aspiration pneumonia.  He was discharged to skilled nursing facility.   In the ED, noted to be in DKA and had septic shock secondary to suspected pneumonia (possibly aspiration pneumonia) and COVID-19 infection.  Admitted to the ICU, started on broad-spectrum antibiotics, Levophed and insulin infusions. His condition improved and he was transferred out of the ICU and to the triad hospitalist service.  He was initially improving after transfer from ICU however his mental status has been poor over the last 48 hours.  Urinalysis is concerning for infection signs remained stable.   Assessment & Plan:   Active Problems:   DKA (diabetic ketoacidosis) ([place])  Septic shock secondary to COVID-19 related pneumonia and possibly aspiration pneumonia -COVID-positive on 9/26 -Chest x-ray on 9/26 revealed worsening opacity in the left base and persistent hazy opacities in the right lower lobe - treated with Remdesivir and Cefepime -blood cultures have remained negative- vancomycin discontinued - Cefepime discontinued on 10/1 -Of note, C. difficile PCR was positive and antigen is positive but toxin was negative-watery stools have resolved and were likely secondary to COVID-19 infection   Acute metabolic encephalopathy likely secondary to UTI Acute confusion noted 10/4 CT chest without any infiltrates Urinalysis In-N-Out cath concerning for infection Plan: Ciprofloxacin,  pharmacy dosing Monitor mental status Avoid sedatives and anticholinergics   Bilateral pleural effusions on CT scan Hypoalbuminemia with anasarca/third spacing of fluids - Albumin 1.7 when last checked -Likely chronic -Offered nutritional supplements   AKI, anion gap metabolic acidosis, hyponatremia - Baseline creatinine is about 0.8 2.9 - Admitted with creatinine of 2.10 which has steadily improved to normal - patient is third spacing fluids   Hypokalemia and hypophosphatemia -will continue to replace and recheck -Magnesium level is normal   Diarrhea - Related to COVID-19 infection but also may be due to Creon not being resumed -has resolved   Orthostatic hypotension - Receiving Florinef and Midodrine -Continue TED hose especially when getting the patient out of bed -I was in the room and helped with standing him up-upon standing, his blood pressure dropped significantly however, he remains asymptomatic.  In fact he was able to stand for 3 to 5 minutes and then transition to a chair with assistance - I have recommend that we continue to transfer him from bed to chair daily and begin to work on ambulation   Right sided weakness and right facial droop - noted on exam 10/1> MRI ordered and was negative for an acute CVA- his weakness had resolved but he continues to have generalized weakness   Diabetes mellitus type 1 with DKA followed by hypoglycemia -DKA resolved-  -Hemoglobin A1C 5.2 -Home medication includes Lantus 6 U and Humalog-currently only on Humalog as he became hypoglycemic  - following oral intake which is very poor today - consider resumption of maybe 4 U of Lantus if oral intake improves   COPD exacerbation? -Per critical care notes, they felt the patient had a COPD exacerbation  on admission - No wheezing noted at this time-   Normocytic anemia - Continue to follow  Pancreatic insufficiency - continue Creon  Parkinson's disease - Sinemet was held by the  ICU team when he was first admitted - I have been following him for 1 wk now- - I have seen no signs of a resting tremor, masked facies or bradykinesia in the hospital- he is able to feed himself- he was able to stand for 5 min at the bedside and then ambulate to the chair with 1 assist (this was despite having orthostatic hypotension -Given encephalopathy occurring over the last 48 hours we will restart Sinemet.  Restarted per home dose on 10/5     Alzheimer's dementia - Continue Exelon-   Severe protein calorie malnutrition - Continue dietary supplements   Severe deconditioning - cont to work with PT-  can get out of bed with 1 person assist - will return to SNF   DVT prophylaxis: SQ heparin Code Status: Full Family Communication: Spouse Woodson Macha [phone] on 10/5 Disposition Plan: Status is: Inpatient  Remains inpatient appropriate because:Inpatient level of care appropriate due to severity of illness  Dispo: The patient is from: SNF              Anticipated d/c is to: SNF              Patient currently is not medically stable to d/c.   Difficult to place patient No       Level of care: Progressive Cardiac  Consultants:  None  Procedures:  None  Antimicrobials: Ciprofloxacin   Subjective: Seen and examined.  Encephalopathic.  Oriented to person.  Objective: Vitals:   02/11/21 1935 02/12/21 0146 02/12/21 0425 02/12/21 0832  BP: 122/60 (!) 119/56 (!) 117/52 (!) 90/48  Pulse: 86 67 84 84  Resp: 17   20  Temp: 98.8 F (37.1 C) 98.4 F (36.9 C) 97.7 F (36.5 C) 98.9 F (37.2 C)  TempSrc:  Oral Oral   SpO2: 98% 96% 94% 94%  Weight:      Height:        Intake/Output Summary (Last 24 hours) at 02/12/2021 1336 Last data filed at 02/12/2021 0600 Gross per 24 hour  Intake 0 ml  Output 0 ml  Net 0 ml   Filed Weights   01/20/2021 1438 01/09/2021 1715 02/04/21 0545  Weight: 65.1 kg 59.8 kg 61.9 kg    Examination:  General exam: No acute distress.   Confused Respiratory system: Clear to auscultation. Respiratory effort normal. Cardiovascular system: S1-S2, RRR, no murmurs, no pedal edema Gastrointestinal system: Abdomen is nondistended, soft and nontender. No organomegaly or masses felt. Normal bowel sounds heard. Central nervous system: Alert, oriented x1, no focal deficits Extremities: Symmetric 5 x 5 power. Skin: No rashes, lesions or ulcers Psychiatry: Judgement and insight appear impaired. Mood & affect flattened.     Data Reviewed: I have personally reviewed following labs and imaging studies  CBC: Recent Labs  Lab 02/07/21 0615 02/08/21 0650 02/09/21 0640 02/10/21 0652 02/11/21 1558  WBC 9.2 6.4 5.0 4.7 5.8  HGB 8.4* 7.8* 7.4* 9.1* 7.9*  HCT 25.9* 23.3* 22.4* 27.6* 24.5*  MCV 99.6 100.0 99.1 100.7* 100.0  PLT 263 228 236 278 403   Basic Metabolic Panel: Recent Labs  Lab 02/08/21 0650 02/09/21 0640 02/10/21 0652 02/11/21 0510 02/12/21 0620  NA 139 140 138 141 141  K 3.3* 3.3* 3.4* 4.1 3.4*  CL 108 109 110 115* 111  CO2  22 19* 17* 16* 16*  GLUCOSE 208* 169* 233* 230* 298*  BUN 18 21 19 20 23   CREATININE 1.25* 1.27* 1.39* 1.66* 1.83*  CALCIUM 7.8* 7.9* 8.1* 8.0* 8.3*  MG 1.7 1.8 1.8 1.7 1.7  PHOS 2.3* 2.1* 2.7 2.4* 3.7   GFR: Estimated Creatinine Clearance: 34.8 mL/min (A) (by C-G formula based on SCr of 1.83 mg/dL (H)). Liver Function Tests: Recent Labs  Lab 02/06/21 0640 02/07/21 0615 02/08/21 0650  AST 16 14* 12*  ALT 11 12 12   ALKPHOS 70 73 68  BILITOT 0.9 0.7 1.0  PROT 4.1* 4.2* 3.9*  ALBUMIN 1.9* 1.8* 1.7*   No results for input(s): LIPASE, AMYLASE in the last 168 hours. No results for input(s): AMMONIA in the last 168 hours. Coagulation Profile: No results for input(s): INR, PROTIME in the last 168 hours. Cardiac Enzymes: No results for input(s): CKTOTAL, CKMB, CKMBINDEX, TROPONINI in the last 168 hours. BNP (last 3 results) No results for input(s): PROBNP in the last 8760  hours. HbA1C: No results for input(s): HGBA1C in the last 72 hours. CBG: Recent Labs  Lab 02/11/21 1707 02/11/21 1936 02/11/21 2215 02/12/21 0822 02/12/21 1152  GLUCAP 167* 180* 181* 329* 345*   Lipid Profile: No results for input(s): CHOL, HDL, LDLCALC, TRIG, CHOLHDL, LDLDIRECT in the last 72 hours. Thyroid Function Tests: No results for input(s): TSH, T4TOTAL, FREET4, T3FREE, THYROIDAB in the last 72 hours. Anemia Panel: No results for input(s): VITAMINB12, FOLATE, FERRITIN, TIBC, IRON, RETICCTPCT in the last 72 hours. Sepsis Labs: No results for input(s): PROCALCITON, LATICACIDVEN in the last 168 hours.  Recent Results (from the past 240 hour(s))  Resp Panel by RT-PCR (Flu A&B, Covid) Nasopharyngeal Swab     Status: Abnormal   Collection Time: 01/19/2021  2:42 PM   Specimen: Nasopharyngeal Swab; Nasopharyngeal(NP) swabs in vial transport medium  Result Value Ref Range Status   SARS Coronavirus 2 by RT PCR POSITIVE (A) NEGATIVE Final    Comment: RESULT CALLED TO, READ BACK BY AND VERIFIED WITH: SHANNON STARK @1744  01/25/2021 MJU (NOTE) SARS-CoV-2 target nucleic acids are DETECTED.  The SARS-CoV-2 RNA is generally detectable in upper respiratory specimens during the acute phase of infection. Positive results are indicative of the presence of the identified virus, but do not rule out bacterial infection or co-infection with other pathogens not detected by the test. Clinical correlation with patient history and other diagnostic information is necessary to determine patient infection status. The expected result is Negative.  Fact Sheet for Patients: EntrepreneurPulse.com.au  Fact Sheet for Healthcare Providers: IncredibleEmployment.be  This test is not yet approved or cleared by the [place] FDA and  has been authorized for detection and/or diagnosis of SARS-CoV-2 by FDA under an Emergency Use Authorization (EUA).  This EUA  will remain in effect (meaning this test can be u sed) for the duration of  the COVID-19 declaration under Section 564(b)(1) of the Act, 21 U.S.C. section 360bbb-3(b)(1), unless the authorization is terminated or revoked sooner.     Influenza A by PCR NEGATIVE NEGATIVE Final   Influenza B by PCR NEGATIVE NEGATIVE Final    Comment: (NOTE) The Xpert Xpress SARS-CoV-2/FLU/RSV plus assay is intended as an aid in the diagnosis of influenza from Nasopharyngeal swab specimens and should not be used as a sole basis for treatment. Nasal washings and aspirates are unacceptable for Xpert Xpress SARS-CoV-2/FLU/RSV testing.  Fact Sheet for Patients: EntrepreneurPulse.com.au  Fact Sheet for Healthcare Providers: IncredibleEmployment.be  This test is not yet approved  or cleared by the [place] and has been authorized for detection and/or diagnosis of SARS-CoV-2 by FDA under an Emergency Use Authorization (EUA). This EUA will remain in effect (meaning this test can be used) for the duration of the COVID-19 declaration under Section 564(b)(1) of the Act, 21 U.S.C. section 360bbb-3(b)(1), unless the authorization is terminated or revoked.  Performed at [facility], [address] [place], [place] [address]   Blood Culture (routine x 2)     Status: None   Collection Time: 01/27/2021  2:42 PM   Specimen: BLOOD  Result Value Ref Range Status   Specimen Description BLOOD RIGHT ANTECUBITAL  Final   Special Requests   Final    BOTTLES DRAWN AEROBIC AND ANAEROBIC Blood Culture adequate volume   Culture   Final    NO GROWTH 5 DAYS Performed at [facility], [address] [place], [place] [address]    Report Status 02/08/2021 FINAL  Final  Blood Culture (routine x 2)     Status: None   Collection Time: 01/27/2021  2:42 PM   Specimen: BLOOD  Result Value Ref Range Status   Specimen Description BLOOD BLOOD RIGHT HAND  Final    Special Requests   Final    BOTTLES DRAWN AEROBIC AND ANAEROBIC Blood Culture adequate volume   Culture   Final    NO GROWTH 5 DAYS Performed at [facility], [address] [place], [place] [address]    Report Status 02/08/2021 FINAL  Final  Urine Culture     Status: None   Collection Time: 01/25/2021  2:42 PM   Specimen: In/Out Cath Urine  Result Value Ref Range Status   Specimen Description   Final    IN/OUT CATH URINE Performed at [facility], [address], [place], [place] [address]    Special Requests   Final    NONE Performed at [facility], [address], [place], [place] [address]    Culture   Final    NO GROWTH Performed at [facility], [place] [address] [place], [place] [address]    Report Status 02/04/2021 FINAL  Final  MRSA Next Gen by PCR, Nasal     Status: Abnormal   Collection Time: 01/22/2021  5:17 PM   Specimen: Nasal Mucosa; Nasal Swab  Result Value Ref Range Status   MRSA by PCR Next Gen DETECTED (A) NOT DETECTED Final    Comment: RESULT CALLED TO, READ BACK BY AND VERIFIED WITH: SHANNON STARK @1855  ON 01/11/2021 SKL (NOTE) The GeneXpert MRSA Assay (FDA approved for NASAL specimens only), is one component of a comprehensive MRSA colonization surveillance program. It is not intended to diagnose MRSA infection nor to guide or monitor treatment for MRSA infections. Test performance is not FDA approved in patients less than 77 years old. Performed at [facility], [place], [place] [address]   C Difficile Quick Screen w PCR reflex     Status: Abnormal   Collection Time: 01/24/2021 11:15 PM   Specimen: STOOL  Result Value Ref Range Status   C Diff antigen POSITIVE (A) NEGATIVE Final   C Diff toxin NEGATIVE NEGATIVE Final   C Diff interpretation Results are indeterminate. See PCR results.  Final    Comment: Performed at [facility], [address] [place],  [address]  C. Diff by  PCR, Reflexed     Status: Abnormal   Collection Time: 01/12/2021 11:15 PM  Result Value Ref Range Status   Toxigenic C. Difficile by PCR POSITIVE (A) NEGATIVE Final    Comment: Positive for toxigenic C. difficile with little to no toxin production. Only treat if clinical presentation suggests symptomatic illness. Performed at [facility], [address], [place], [place] [address]          Radiology Studies: CT CHEST WO CONTRAST  Result Date: 02/11/2021 CLINICAL DATA:  Respiratory failure. EXAM: CT CHEST WITHOUT CONTRAST TECHNIQUE: Multidetector CT imaging of the chest was performed following the standard protocol without IV contrast. COMPARISON:  January 20, 2021. FINDINGS: Cardiovascular: No significant vascular findings. Normal heart size. No pericardial effusion. Mediastinum/Nodes: No enlarged mediastinal or axillary lymph nodes. Thyroid gland, trachea, and esophagus demonstrate no significant findings. Lungs/Pleura: No pneumothorax is noted. Moderate to large bilateral pleural effusions are noted with adjacent atelectasis of both upper and lower lobes, left greater than right. Upper Abdomen: No acute abnormality. Musculoskeletal: Status post kyphoplasty of lower thoracic vertebral body. Acute osseous abnormality is noted IMPRESSION: Moderate to large bilateral pleural effusions are noted with adjacent atelectasis of both upper and lower lobes, left greater than right. Electronically Signed   By: Marijo Conception M.D.   On: 02/11/2021 13:42        Scheduled Meds:  vitamin C  500 mg Oral Daily   bismuth subsalicylate  30 mL Oral QID   ciprofloxacin  500 mg Oral BID   feeding supplement  237 mL Oral TID BM   fludrocortisone  200 mcg Oral Daily   folic acid  1 mg Oral Daily   heparin  5,000 Units Subcutaneous Q8H   insulin aspart  0-6 Units Subcutaneous TID WC   insulin glargine-yfgn  3 Units Subcutaneous Daily   lipase/protease/amylase  36,000 Units Oral TID AC    midodrine  10 mg Oral TID WC   multivitamin with minerals  1 tablet Oral Daily   pantoprazole  40 mg Oral Daily   rivastigmine  1.5 mg Oral BID   thiamine  100 mg Oral Daily   zinc sulfate  220 mg Oral Daily   Continuous Infusions:   LOS: 9 days    Time spent: 35 minutes    Sidney Ace, MD [facility]   If 7PM-7AM, please contact night-coverage  02/12/2021, 1:36 PM

## 2021-02-12 NOTE — Progress Notes (Signed)
Inpatient Diabetes Program Recommendations  AACE/ADA: New Consensus Statement on Inpatient Glycemic Control   Target Ranges:  Prepandial:   less than 140 mg/dL      Peak postprandial:   less than 180 mg/dL (1-2 hours)      Critically ill patients:  140 - 180 mg/dL  Results for [name], [name] (MRN [id number]) as of 02/12/2021 07:09  Ref. Range 02/12/2021 06:20  Glucose Latest Ref Range: 70 - 99 mg/dL 298 (H)   Results for [name], [name] (MRN [id number]) as of 02/12/2021 07:09  Ref. Range 02/11/2021 07:55 02/11/2021 11:38 02/11/2021 17:07 02/11/2021 19:36 02/11/2021 22:15  Glucose-Capillary Latest Ref Range: 70 - 99 mg/dL 253 (H) 242 (H) 167 (H) 180 (H) 181 (H)    Review of Glycemic Control  Admit with Sepsis/ Pneumonia/ DKA/ COVID+   History: T1DM   SNF Meds: Lantus 6 units QHS        Humalog 0-9 units TID with meals per SSI        Humalog 3 units TID with meals for carbohydrate coverage Current orders for Inpatient glycemic control: Novolog 0-6 units TID with meals   Inpatient Diabetes Program Recommendations:     Insulin: Please consider ordering Semglee 3 units daily.   NOTE: Patient has Type 1 DM and sees Dr. Honor Junes (Endocrinologist) for DM management. Per chart, patient last seen Dr. Honor Junes on 11/08/20 and per office note patient was on an insulin pump with the following settings:  He is on the T slim control IQ pump:  Manual mode basal rates Midnight = 0.35 3 AM = 0.275  6 AM = 0.275 2 PM = 0.35 4 PM = 0.55 6:30 PM = 0.45 TDD basal: 8.6 units/day  Bolus settings I/C: 25 (1 unit covers 25 grams of carbs) ISF: 80 (1 unit drops glucose 80 mg/dl) Target Glucose: 120 mg/dl Active insulin time: 5 hours Patient is very sensitive to insulin and requires basal, correction, and meal coverage insulin as he makes NO insulin at all.   Thanks, Barnie Alderman, RN, MSN, CDE Diabetes Coordinator Inpatient Diabetes Program [phone] (Team Pager from 8am to 5pm)

## 2021-02-12 NOTE — Progress Notes (Signed)
Chaplain Maggie made initial visit with pt's grand daughter in ICU waiting room. Introduced Careers adviser care and offered hospitality. Continued support available per on call chaplain.

## 2021-02-13 DIAGNOSIS — U071 COVID-19: Secondary | ICD-10-CM | POA: Diagnosis not present

## 2021-02-13 DIAGNOSIS — L8990 Pressure ulcer of unspecified site, unspecified stage: Secondary | ICD-10-CM | POA: Insufficient documentation

## 2021-02-13 LAB — COMPREHENSIVE METABOLIC PANEL
ALT: <5 U/L (ref 0–44)
AST: 13 U/L — ABNORMAL LOW (ref 15–41)
Albumin: 1.8 g/dL — ABNORMAL LOW (ref 3.5–5.0)
Alkaline Phosphatase: 92 U/L (ref 38–126)
Anion gap: 5 (ref 5–15)
BUN: 24 mg/dL — ABNORMAL HIGH (ref 8–23)
CO2: 22 mmol/L (ref 22–32)
Calcium: 8.3 mg/dL — ABNORMAL LOW (ref 8.9–10.3)
Chloride: 117 mmol/L — ABNORMAL HIGH (ref 98–111)
Creatinine, Ser: 2.12 mg/dL — ABNORMAL HIGH (ref 0.61–1.24)
GFR, Estimated: 34 mL/min — ABNORMAL LOW (ref >60)
Glucose, Bld: 146 mg/dL — ABNORMAL HIGH (ref 70–99)
Potassium: 3.3 mmol/L — ABNORMAL LOW (ref 3.5–5.1)
Sodium: 144 mmol/L (ref 135–145)
Total Bilirubin: 1.1 mg/dL (ref 0.3–1.2)
Total Protein: 4.3 g/dL — ABNORMAL LOW (ref 6.5–8.1)

## 2021-02-13 LAB — BLOOD GAS, ARTERIAL
Acid-base deficit: 6.9 mmol/L — ABNORMAL HIGH (ref 0.0–2.0)
Bicarbonate: 18.5 mmol/L — ABNORMAL LOW (ref 20.0–28.0)
FIO2: 100
MECHVT: 550 mL
Mechanical Rate: 20
O2 Saturation: 98.3 %
PEEP: 8 cmH2O
Patient temperature: 37
pCO2 arterial: 36 mmHg (ref 32.0–48.0)
pH, Arterial: 7.32 — ABNORMAL LOW (ref 7.350–7.450)
pO2, Arterial: 118 mmHg — ABNORMAL HIGH (ref 83.0–108.0)

## 2021-02-13 LAB — TROPONIN I (HIGH SENSITIVITY)
Troponin I (High Sensitivity): 38 ng/L — ABNORMAL HIGH (ref <18)
Troponin I (High Sensitivity): 37 ng/L — ABNORMAL HIGH (ref <18)

## 2021-02-13 LAB — LACTIC ACID, PLASMA: Lactic Acid, Venous: 1.7 mmol/L (ref 0.5–1.9)

## 2021-02-13 LAB — BRAIN NATRIURETIC PEPTIDE: B Natriuretic Peptide: 957 pg/mL — ABNORMAL HIGH (ref 0.0–100.0)

## 2021-02-13 MED ORDER — MIDAZOLAM-SODIUM CHLORIDE 100-0.9 MG/100ML-% IV SOLN
0.5000 mg/h | INTRAVENOUS | Status: DC
  Administered 2021-02-13 (×2): 3 mg/h via INTRAVENOUS
  Filled 2021-02-13: qty 100

## 2021-02-13 MED ORDER — ROCURONIUM BROMIDE 50 MG/5ML IV SOLN
50.0000 mg | Freq: Once | INTRAVENOUS | Status: AC
  Filled 2021-02-13: qty 5

## 2021-02-13 MED ORDER — FENTANYL 2500MCG IN NS 250ML (10MCG/ML) PREMIX INFUSION
25.0000 ug/h | INTRAVENOUS | Status: DC
  Administered 2021-02-13: 50 ug/h via INTRAVENOUS
  Filled 2021-02-13 (×2): qty 250

## 2021-02-13 MED ORDER — DOCUSATE SODIUM 50 MG/5ML PO LIQD: 100.0000 mg | Freq: Two times a day (BID) | ORAL | Status: DC

## 2021-02-13 MED ORDER — FENTANYL CITRATE PF 50 MCG/ML IJ SOSY: 50.0000 ug | PREFILLED_SYRINGE | Freq: Once | INTRAMUSCULAR | Status: AC

## 2021-02-13 MED ORDER — FUROSEMIDE 10 MG/ML IJ SOLN: 40.0000 mg | Freq: Once | INTRAMUSCULAR | Status: DC

## 2021-02-13 MED ORDER — SODIUM CHLORIDE 0.9 % IV SOLN
2.0000 g | Freq: Once | INTRAVENOUS | Status: DC
  Filled 2021-02-13: qty 2

## 2021-02-13 MED ORDER — ETOMIDATE 2 MG/ML IV SOLN: 20.0000 mg | Freq: Once | INTRAVENOUS | Status: AC

## 2021-02-13 MED ORDER — SODIUM BICARBONATE 8.4 % IV SOLN
50.0000 meq | Freq: Once | INTRAVENOUS | Status: AC
  Administered 2021-02-13: 50 meq via INTRAVENOUS

## 2021-02-13 MED ORDER — VASOPRESSIN 20 UNITS/100 ML INFUSION FOR SHOCK
0.0000 [IU]/min | INTRAVENOUS | Status: DC
Start: 2021-02-13 — End: 2021-02-13
  Administered 2021-02-13: 0.03 [IU]/min via INTRAVENOUS
  Filled 2021-02-13 (×2): qty 100

## 2021-02-13 MED ORDER — HYDROCORTISONE SOD SUC (PF) 100 MG IJ SOLR: 50.0000 mg | Freq: Three times a day (TID) | INTRAMUSCULAR | Status: DC

## 2021-02-13 MED ORDER — PROPOFOL 1000 MG/100ML IV EMUL: 0.0000 ug/kg/min | INTRAVENOUS | Status: DC

## 2021-02-13 MED ORDER — VANCOMYCIN HCL 1500 MG/300ML IV SOLN
1500.0000 mg | Freq: Once | INTRAVENOUS | Status: DC
  Filled 2021-02-13: qty 300

## 2021-02-13 MED ORDER — HYDROCORTISONE SOD SUC (PF) 100 MG IJ SOLR
50.0000 mg | Freq: Three times a day (TID) | INTRAMUSCULAR | Status: DC
  Administered 2021-02-13: 50 mg via INTRAVENOUS

## 2021-02-13 MED ORDER — PHENYLEPHRINE CONCENTRATED 100MG/250ML (0.4 MG/ML) INFUSION SIMPLE
0.0000 ug/min | INTRAVENOUS | Status: DC
  Administered 2021-02-13: 20 ug/min via INTRAVENOUS
  Filled 2021-02-13 (×2): qty 250

## 2021-02-13 MED ORDER — FENTANYL BOLUS VIA INFUSION
25.0000 ug | INTRAVENOUS | Status: DC | PRN
  Filled 2021-02-13: qty 100

## 2021-02-13 MED ORDER — PROPOFOL 1000 MG/100ML IV EMUL
INTRAVENOUS | Status: AC
  Filled 2021-02-13: qty 100

## 2021-02-13 MED ORDER — NOREPINEPHRINE 16 MG/250ML-% IV SOLN
0.0000 ug/min | INTRAVENOUS | Status: DC
  Administered 2021-02-13: 35 ug/min via INTRAVENOUS
  Filled 2021-02-13 (×2): qty 250

## 2021-02-13 MED ORDER — POLYETHYLENE GLYCOL 3350 17 G PO PACK: 17.0000 g | PACK | Freq: Every day | ORAL | Status: DC

## 2021-02-18 LAB — BLOOD GAS, VENOUS
Acid-Base Excess: 2.8 mmol/L — ABNORMAL HIGH (ref 0.0–2.0)
Acid-base deficit: 27.9 mmol/L — ABNORMAL HIGH (ref 0.0–2.0)
Bicarbonate: 4.4 mmol/L — ABNORMAL LOW (ref 20.0–28.0)
Bicarbonate: 27.2 mmol/L (ref 20.0–28.0)
FIO2: 36
O2 Saturation: 40.6 %
O2 Saturation: 84.3 %
Patient temperature: 37
Patient temperature: 37
pCO2, Ven: 26 mmHg — ABNORMAL LOW (ref 44.0–60.0)
pCO2, Ven: 40 mmHg — ABNORMAL LOW (ref 44.0–60.0)
pH, Ven: <6.9 — CL (ref 7.250–7.430)
pH, Ven: 7.44 — ABNORMAL HIGH (ref 7.250–7.430)
pO2, Ven: 45 mmHg (ref 32.0–45.0)
pO2, Ven: 47 mmHg — ABNORMAL HIGH (ref 32.0–45.0)

## 2021-03-11 NOTE — Progress Notes (Signed)
SLP Cancellation Note  Patient Details Name: John Reilly MRN: [id number] DOB: 04/24/1956   Cancelled treatment:       Reason Eval/Treat Not Completed: Medical issues which prohibited therapy (chart reviewed.).  Pt had a decline of status yesterday and was transferred to CCU, now orally intubated. Per MD notes, pt was made DNR, and family is proceeding with Comfort Care measures. ST services will sign off at this time with MD to reconsult for any further services.      Orinda Kenner, MS, CCC-SLP Speech Language Pathologist Rehab Services [phone] [facility] 02-15-21, 8:04 AM

## 2021-03-11 NOTE — Progress Notes (Signed)
  Goals of Care:   The Clinical status was relayed to patient's wife John Reilly in detail including cardiac arrest and subsequent interventions. Updated and notified of patients medical condition.  Patient remains unresponsive and will not open eyes to command.  He is hemodynamically unstable on 3 vasopressors. Patient with increased WOB and using accessory muscles to breathe DESPITE being on sedation Explained to family course of therapy and the modalities    Patient with Progressive multiorgan failure with very low chance of meaningful recovery despite all aggressive and optimal medical therapy. Patient is in the Dying  Process associated with Suffering.  Family understands the situation.They have consented and agreed to DNR/DNI and would like to proceed with Comfort care measures.  Patient's wife would like to be at the bedside at the time of terminal extubation and transition to comfort care.  Family are satisfied with Plan of action and management. All questions answered   Rufina Falco, DNP, CCRN, FNP-C, AGACNP-BC Acute Care Nurse Practitioner  [place] [facility] Pager: [phone] [place] at [facility]

## 2021-03-11 NOTE — Procedures (Signed)
Arterial Catheter Insertion Procedure Note  QUINZELL MALCOMB  [id number]  February 09, 1956  Date:2021-02-20  Time:2:52 AM    Provider Performing: Karen Kays    Procedure: Insertion of Arterial Line [phone]) with US guidance ([id number])   Indication(s) Blood pressure monitoring and/or need for frequent ABGs  Consent Unable to obtain consent due to emergent nature of procedure.  Anesthesia None   Time Out Verified patient identification, verified procedure, site/side was marked, verified correct patient position, special equipment/implants available, medications/allergies/relevant history reviewed, required imaging and test results available.   Sterile Technique Maximal sterile technique including full sterile barrier drape, hand hygiene, sterile gown, sterile gloves, mask, hair covering, sterile ultrasound probe cover (if used).   Procedure Description Area of catheter insertion was cleaned with chlorhexidine and draped in sterile fashion. With real-time ultrasound guidance an arterial catheter was placed into the right femoral artery.  Appropriate arterial tracings confirmed on monitor.     Complications/Tolerance None; patient tolerated the procedure well.   EBL Minimal   Specimen(s) None   Rufina Falco, DNP, CCRN, FNP-C, AGACNP-BC Acute Care Nurse Practitioner  [place] [facility] Pager: [phone] [place] at [facility]

## 2021-03-11 NOTE — Progress Notes (Signed)
Rate decreased at 18 per NP

## 2021-03-11 NOTE — Consult Note (Signed)
NAME:  John Reilly, MRN:  [id number], DOB:  04/06/1956, LOS: 89 ADMISSION DATE:  02/04/2021, CONSULTATION DATE:  02/12/2021 REFERRING MD:  Sharion Settler, NP  CHIEF COMPLAINT: Unresponsiveness   HPI  John Reilly is a 65 year old male with a past medical history significant for type 1 diabetes mellitus, COPD, hypertension, hyperlipidemia who presented to [facility] ED on 02/03/2022 due to complaints of altered mental status.    Patiently is currently altered, therefore history is obtained from ED nursing notes along with wife's report at bedside. He was recently admitted and treated at [facility] from 01/20/21 through 01/28/21 for Severe Sepsis in the setting of aspiration pneumonia, and was discharged to a skilled nursing facility.  Today at the facility his blood sugar was elevated and he was noted to be altered so they dispatched EMS.   ED Course: On arrival to the ED, he was afebrile with blood pressure 70/56 mm Hg and pulse rate 132 beats/min. Respiratory rate 31, pulse 132, with 100% SPO2 on 2 L nasal cannula. He remained altered, appeared mottled but was protecting his airway. Labs/Diagnostics: Chemistry: Sodium 133, bicarb less than 7, glucose 772, anion gap not calculated, BUN 25, creatinine 2.1, albumin 2.5, high-sensitivity troponin 141, lactic acid 6.8,  CBC: WBC 17.4 with neutrophilia, hemoglobin 9.0, platelets 491, PT 15.8, INR 1.3, beta hydroxybutyrate acid >8 Urinalysis: positive for ketones, not consistent with UTI VBG: pH <6.9 / pCO2 26 / pO2 45/ Bicarb 4.4 Imaging: Chest x-ray>>1. Worsening opacity in the left base with suspected trace left pleural effusion raises suspicion for developing left lower lobe pneumonia. Persistent hazy opacities in the right lower lobe with a small right pleural effusion suggesting ongoing right basilar infection. EKG Tachycardia, no clear P waves, left axis deviation, poor R wave progression, prolonged QTc interval, no acute ischemic changes    He met  criteria for DKA and sepsis.  Therefore he received 2 L of LR boluses (received 500 cc with EMS), was placed on insulin drip, along with broad-spectrum antibiotics including cefepime, Flagyl, vancomycin.  Given his severe metabolic acidosis he was given 1 amp of bicarb and started on bicarb drip.  Despite IV fluid resuscitation he remained hypotensive requiring Levophed drip. PCCM consulted for admission and management.  Hospital Course: During the course of his admission patient was treated for sepsis with septic shock secondary to COVID-related pneumonia and possible aspiration pneumonia, complicated DKA, associated with acute metabolic encephalopathy, AKI and hypotension.  He was transition of insulin drip and transferred to hospitalist service and out of the ICU on 9/28.  On 10/1, rapid response was initiated for change in mental status.  Patient apparently was noted with some weakness and a mild facial droop.  Code stroke was activated due to mental status change and hypoxia.  Patient was evaluated by neurology at the bedside who felt symptoms were not stroke related and code stroke was canceled patient's mental status continues to fluctuate, UA was sent and is concerning for UTI therefore patient was started on antibiotics.  On 10/5, rapid response was initiated again for decreased responsiveness and hypoxia.  Patient transported to the ICU in no respiratory distress.  Arterial Blood Gas result:  pO2 73; pCO2 72; pH 7.06;  HCO3 20.4, %O2 Sat 86. Patient intubated upon arrival to the ICU for airway protection.  PCCM reconsulted for management  Past Medical History  Diabetes mellitus type 1 COPD Hypertension Hyperlipidemia Peripheral vascular disease GERD Multiple duodenal ulcers Malignant neoplasm of colon Colon polyps Hip fracture  T12 compression fracture Parkinson's disease Retinopathy  Significant Hospital Events   9/26: Admitted to the ICU with severe sepsis with septic shock and severe  DKA 9/28: Patient stable resolution of DKA with transfer to hospitalist service and out of the ICU 10/1: Code stroke activated due to change in mental status and strokelike symptoms.  Consult by neurology 10/5: Rapid response initiated for unresponsiveness and and acute hypoxic hypercapnic respiratory failure transferred to the ICU and intubated.  PCCM consulted  Consults:  PCCM  Procedures:  10/5: Endotracheal intubation 10/5: Left IJ placement  Significant Diagnostic Tests:  10/5: Chest Xray>Bilateral pleural effusions and left retrocardiac consolidation. Patchy airspace opacities consistent with atelectasis are seen 10/1: Noncontrast MRI Brain> no acute intracranial abnormality 10/4: CTA Chest> Moderate to large bilateral pleural effusions are noted with adjacent atelectasis of both upper and lower lobes, left greater than right  Micro Data:  01/29/2021: SARS-CoV-2 > POSITIVE 9/26: influenza PCR>> Negative 01/16/2021: Blood culture x2>> 01/24/2021: Urine>>NEGATIVE 01/14/2021: MRSA PCR> POSITIVE 02/05/2021: Strep pneumo urinary antigen>>Negative 01/22/2021: Legionella urine antigen>>Negative  Antimicrobials:  Cefepime 9/26>>STOPPED 10/1 Restarted 10/6> Vancomycin 9/26>>STOPPED. Restarted 10/6> Flagyl 9/26 x 1 dose  OBJECTIVE  Blood pressure (!) 67/55, pulse 73, temperature (!) 94.5 F (34.7 C), resp. rate 20, height _0  (1.778 m), weight 61.9 kg, SpO2 94 %.    Vent Mode: PRVC FiO2 (%):  [100 %] 100 % Set Rate:  [20 bmp] 20 bmp Vt Set:  [470 mL-550 mL] 550 mL PEEP:  [8 cmH20] 8 cmH20 Plateau Pressure:  [24 cmH20] 24 cmH20   Intake/Output Summary (Last 24 hours) at February 21, 2021 0033 Last data filed at 02/12/2021 0800 Gross per 24 hour  Intake 60 ml  Output 0 ml  Net 60 ml   Filed Weights   01/18/2021 1438 01/29/2021 1715 02/04/21 0545  Weight: 65.1 kg 59.8 kg 61.9 kg   Physical Examination  GENERAL: 65 year-old critically ill patient lying in the bed intubated, mechanically  ventilated and sedated EYES: Pupils equal, round, reactive to light and accommodation. No scleral icterus. Extraocular muscles intact.  HEENT: Head atraumatic, normocephalic. Oropharynx and nasopharynx clear.  NECK:  Supple, no jugular venous distention. No thyroid enlargement, no tenderness.  LUNGS: Decreased breath sounds bilaterally, no wheezing, rales,rhonchi or crepitation. No use of accessory muscles of respiration.  CARDIOVASCULAR: S1, S2 normal. No murmurs, rubs, or gallops.  ABDOMEN: Soft, nontender, nondistended. Bowel sounds present. No organomegaly or mass.  EXTREMITIES: Generalized edema, cyanosis, or clubbing.  NEUROLOGIC: Cranial nerves II through XII are intact.  Muscle strength not assessed. Sensation intact. Gait not checked.  PSYCHIATRIC: The patient is intubated and sedated SKIN: No obvious rash, lesion, or ulcer.  Multiple ecchymosis and bruising  Labs/imaging that I havepersonally reviewed  (right click and "Reselect all SmartList Selections" daily)     Labs   CBC: Recent Labs  Lab 02/08/21 0650 02/09/21 0640 02/10/21 0652 02/11/21 1558 02/12/21 2242  WBC 6.4 5.0 4.7 5.8 4.8  HGB 7.8* 7.4* 9.1* 7.9* 8.4*  HCT 23.3* 22.4* 27.6* 24.5* 25.8*  MCV 100.0 99.1 100.7* 100.0 102.0*  PLT 228 236 278 265 440    Basic Metabolic Panel: Recent Labs  Lab 02/08/21 0650 02/09/21 0640 02/10/21 0652 02/11/21 0510 02/12/21 0620  NA 139 140 138 141 141  K 3.3* 3.3* 3.4* 4.1 3.4*  CL 108 109 110 115* 111  CO2 22 19* 17* 16* 16*  GLUCOSE 208* 169* 233* 230* 298*  BUN _1 CREATININE  1.25* 1.27* 1.39* 1.66* 1.83*  CALCIUM 7.8* 7.9* 8.1* 8.0* 8.3*  MG 1.7 1.8 1.8 1.7 1.7  PHOS 2.3* 2.1* 2.7 2.4* 3.7   GFR: Estimated Creatinine Clearance: 34.8 mL/min (A) (by C-G formula based on SCr of 1.83 mg/dL (H)). Recent Labs  Lab 02/09/21 0640 02/10/21 0652 02/11/21 1558 02/12/21 2242  WBC 5.0 4.7 5.8 4.8  LATICACIDVEN  --   --   --  1.1    Liver Function  Tests: Recent Labs  Lab 02/06/21 0640 02/07/21 0615 02/08/21 0650  AST 16 14* 12*  ALT _0 ALKPHOS 70 73 68  BILITOT 0.9 0.7 1.0  PROT 4.1* 4.2* 3.9*  ALBUMIN 1.9* 1.8* 1.7*   No results for input(s): LIPASE, AMYLASE in the last 168 hours. No results for input(s): AMMONIA in the last 168 hours.  ABG    Component Value Date/Time   PHART 7.17 (LL) 02/12/2021 2022   PCO2ART 54 (H) 02/12/2021 2022   PO2ART 100 02/12/2021 2022   HCO3 19.7 (L) 02/12/2021 2022   ACIDBASEDEF 8.6 (H) 02/12/2021 2022   O2SAT 95.8 02/12/2021 2022     Coagulation Profile: No results for input(s): INR, PROTIME in the last 168 hours.  Cardiac Enzymes: No results for input(s): CKTOTAL, CKMB, CKMBINDEX, TROPONINI in the last 168 hours.  HbA1C: Hgb A1c MFr Bld  Date/Time Value Ref Range Status  01/23/2021 06:49 AM 5.2 4.8 - 5.6 % Final    Comment:    (NOTE) Pre diabetes:          5.7%-6.4%  Diabetes:              >6.4%  Glycemic control for   <7.0% adults with diabetes   03/21/2020 10:20 PM 6.6 (H) 4.8 - 5.6 % Final    Comment:    (NOTE) Pre diabetes:          5.7%-6.4%  Diabetes:              >6.4%  Glycemic control for   <7.0% adults with diabetes     CBG: Recent Labs  Lab 02/12/21 0822 02/12/21 1152 02/12/21 1624 02/12/21 1626 02/12/21 1924  GLUCAP 329* 345* 201* 124* 162*    Review of Systems:   UNABLE TO OBTAIN PATIENT IS INTUBATED AND SEDATED  Past Medical History  He,  has a past medical history of Cancer ([place]), Colon polyps, COPD (chronic obstructive pulmonary disease) ([place]), Diabetes mellitus without complication ([place]), GERD (gastroesophageal reflux disease), Hip fracture ([place]), History of malignant neoplasm of colon, Hypertension, Multiple duodenal ulcers, Parkinson disease ([place]), Peripheral vascular disease ([place]), Retinopathy, and T12 compression fracture ([place]) (11/2020).   Surgical History    Past Surgical History:  Procedure Laterality Date   ANKLE  ARTHROPLASTY Right 12/2011   COLONOSCOPY     1998, 1998, 1999, 2002, 2006, 2009, 2013, 2018   COLONOSCOPY WITH PROPOFOL N/A 10/12/2016   Procedure: COLONOSCOPY WITH PROPOFOL;  Surgeon: Manya Silvas, MD;  Location: [facility] ENDOSCOPY;  Service: Endoscopy;  Laterality: N/A;   ESOPHAGOGASTRODUODENOSCOPY  07/09/2011   FINGER SURGERY Right 09/2007   thumb repair due to crushing injury; metal hardware   [place] (IM) NAIL INTERTROCHANTERIC Left 08/08/2018   Procedure: INTRAMEDULLARY ([place]) NAIL INTERTROCHANTRIC;  Surgeon: Corky Mull, MD;  Location: [facility] ORS;  Service: Orthopedics;  Laterality: Left;   KYPHOPLASTY N/A 12/12/2020   Procedure: T 12 KYPHOPLASTY;  Surgeon: Hessie Knows, MD;  Location: [facility] ORS;  Service: Orthopedics;  Laterality: N/A;     Social History   reports that he quit smoking about 15 years ago. His smoking use included cigarettes. He has never used smokeless tobacco. He reports current alcohol use of about 21.0 standard drinks per week. He reports that he does not currently use drugs after having used the following drugs: Marijuana.   Family History   His family history includes Alzheimer's disease in his father; Diabetes in his brother; Stroke in his mother.   Allergies Allergies  Allergen Reactions   Penicillins Rash and Other (See Comments)    Has patient had a PCN reaction causing immediate rash, facial/tongue/throat swelling, SOB or lightheadedness with hypotension: Yes Has patient had a PCN reaction causing severe rash involving mucus membranes or skin necrosis: No Has patient had a PCN reaction that required hospitalization: No Has patient had a PCN reaction occurring within the last 10 years: No If all of the above answers are "NO", then may proceed with Cephalosporin use. Other reaction(s): RASH Has patient had a PCN reaction causing immediate rash, facial/tongue/throat swelling, SOB or lightheadedness with hypotension:  Yes Has patient had a PCN reaction causing severe rash involving mucus membranes or skin necrosis: No Has patient had a PCN reaction that required hospitalization: No Has patient had a PCN reaction occurring within the last 10 years: No If all of the above answers are "NO", then may proceed with Cephalosporin use. Reaction:  Unknown; childhood reaction  Has patient had a PCN reaction causing immediate rash, facial/tongue/throat swelling, SOB or lightheadedness with hypotension: Yes Has patient had a PCN reaction causing severe rash involving mucus membranes or skin necrosis: No Has patient had a PCN reaction that required hospitalization: No Has patient had a PCN reaction occurring within the last 10 years: No If all of the above answers are "NO", then may proceed with Cephalosporin use.   Sulfa Antibiotics Rash    Katherina Right Syndrome (SJS)      Home Medications  Prior to Admission medications   Medication Sig Start Date End Date Taking? Authorizing Provider  carbidopa-levodopa (SINEMET CR) 50-200 MG tablet Take 1 tablet by mouth at bedtime.    Yes [provider]  carbidopa-levodopa (SINEMET IR) 25-250 MG tablet Take 1 tablet by mouth 4 (four) times daily.    Yes [provider]  fludrocortisone (FLORINEF) 0.1 MG tablet Take 200 mcg by mouth daily. 09/20/20  Yes [provider]  folic acid (FOLVITE) 342 MCG tablet Take 800 mcg by mouth daily.    Yes [provider]  insulin glargine (LANTUS) 100 UNIT/ML injection Inject 0.06 mLs (6 Units total) into the skin at bedtime. 01/28/21  Yes Mercy Riding, MD  insulin lispro (HUMALOG) 100 UNIT/ML cartridge Inject 0-0.09 mLs (0-9 Units total) into the skin 3 (three) times daily with meals. 0-9 Units, Subcutaneous, 3 times daily with meals, First dose on Sun 01/26/21 at 1700 Correction coverage: Sensitive (thin, NPO, renal) CBG < 70: Implement Hypoglycemia Standing Orders and refer to Hypoglycemia Standing Orders  sidebar report CBG 70 - 120: 0 units CBG 121 - 150: 1 unit CBG 151 - 200: 2 units CBG 201 - 250: 3 units CBG 251 - 300: 5 units CBG 301 - 350: 7 units CBG 351 - 400: 9 units CBG > 400: call MD and obtain STAT lab verification 01/28/21  Yes Gonfa, Taye T, MD  insulin lispro (HUMALOG) 100 UNIT/ML cartridge Inject 0.03 mLs (3 Units total) into the skin 3 (three) times daily with  meals. Hold if not completing more than 50% of his meals or CBG <100 01/28/21  Yes Gonfa, Taye T, MD  lipase/protease/amylase (CREON) 36000 UNITS CPEP capsule Take 1 capsule (36,000 Units total) by mouth 3 (three) times daily before meals. 01/28/21  Yes Mercy Riding, MD  magnesium oxide (MAG-OX) 400 MG tablet Take 400 mg by mouth daily.   Yes [provider]  melatonin 5 MG TABS Take 1 tablet (5 mg total) by mouth at bedtime. 01/28/21  Yes Mercy Riding, MD  midodrine (PROAMATINE) 5 MG tablet Take 1 tablet (5 mg total) by mouth 3 (three) times daily with meals. 01/28/21  Yes Mercy Riding, MD  Multiple Vitamin (MULTIVITAMIN WITH MINERALS) TABS tablet Take 1 tablet by mouth daily.   Yes [provider]  omeprazole (PRILOSEC) 40 MG capsule Take 40 mg by mouth at bedtime.   Yes [provider]  rivastigmine (EXELON) 1.5 MG capsule Take 1.5 mg by mouth 2 (two) times daily.   Yes [provider]  thiamine 100 MG tablet Take 1 tablet (100 mg total) by mouth daily. 01/28/21  Yes Mercy Riding, MD  acetaminophen (TYLENOL) 325 MG tablet Take 2 tablets (650 mg total) by mouth every 6 (six) hours as needed for mild pain, fever or headache. 01/28/21   Mercy Riding, MD  feeding supplement (ENSURE ENLIVE / ENSURE PLUS) LIQD Take 237 mLs by mouth daily. 01/28/21   Mercy Riding, MD  ipratropium-albuterol (DUONEB) 0.5-2.5 (3) MG/3ML SOLN Take 3 mLs by nebulization every 6 (six) hours as needed (Shortness of breath, cough and/or wheeze). 01/28/21   Mercy Riding, MD    Scheduled Meds:  vitamin C  500 mg Oral Daily    bismuth subsalicylate  30 mL Oral QID   carbidopa-levodopa  1 tablet Oral QHS   carbidopa-levodopa  1 tablet Per Tube QID   ciprofloxacin  500 mg Per Tube BID   docusate  100 mg Per Tube BID   feeding supplement  237 mL Oral TID BM   fludrocortisone  200 mcg Oral Daily   folic acid  1 mg Oral Daily   furosemide  40 mg Intravenous Once   heparin  5,000 Units Subcutaneous Q8H   insulin aspart  0-6 Units Subcutaneous TID WC   insulin glargine-yfgn  3 Units Subcutaneous Daily   lipase/protease/amylase  36,000 Units Oral TID AC   midodrine  10 mg Oral TID WC   mirtazapine  15 mg Per Tube QHS   multivitamin with minerals  1 tablet Oral Daily   pantoprazole  40 mg Oral Daily   polyethylene glycol  17 g Per Tube Daily   rivastigmine  1.5 mg Per Tube BID   thiamine  100 mg Oral Daily   zinc sulfate  220 mg Oral Daily   Continuous Infusions:  fentaNYL infusion INTRAVENOUS     norepinephrine (LEVOPHED) Adult infusion     propofol (DIPRIVAN) infusion 30 mcg/kg/min (2021-02-18 0051)   vasopressin     PRN Meds:.acetaminophen, dextrose, fentaNYL, ibuprofen  Assessment & Plan:  Acute Hypoxic Hypercapnic Respiratory Failure secondary to pulmonary edema, possible aspiration pneumonia, and COVID infection PMHx: COPD -continue ventilator support & lung protective strategies -Wean PEEP & FiO2 as tolerated, maintain SpO2 > 90% -Head of bed elevated 30 degrees, VAP protocol in place -Plateau pressures less than 30 cm H20  -Intermittent chest x-ray & ABG PRN -Daily WUA with SBT as tolerated  -Ensure adequate pulmonary hygiene  -Will  give IV Lasix x1 dose -Steroids initiated: Solu-Cortef -As needed bronchodilators -PAD protocol in place: continue Fentanyl gtt & Propofol Versed drip  Severe Sepsis due to COVID-19 Infection with suspected superimposed Healthcare Associated Pneumonia vs. Aspiration Pneumonia and UTI -Monitor fever curve -Trend WBC's & Procalcitonin -Completed Cefepime & Vancomycin  and started on po cipro -Blood cultures shows no growth so far, strep pneumo and Legionella negative -Treated with Remdesivir, plan for 5 day course -Trend inflammatory markers: CRP, Ferritin, D-dimer -Start stress dose steroids Solu-Cortef 50 mg TID -Vitamin C & Zinc -Maintain Airborne and contact precautions -Start vasopressors to keep MAP greater than 65 mmHg.  Currently on Levophed and vasopressin -Start stress dose steroids -Maintain Euvovolemia to net negative fluid balance as able -Self proning as tolerated  Acute Kidney Injury -Monitor I&O's / urinary output -Follow BMP -Ensure adequate renal perfusion -Avoid nephrotoxic agents as able -Replace electrolytes as indicated -IV Fluids -Bicarb as needed, s/p 3 amps of sodium bicarb -Trend lactic acid  Acute Metabolic Encephalopathy Likely Multifactorial in the setting of hypercabia, hypoxia and sepsis. Reported History of ETOH abuse (reportedly drinks about 42 oz beer/day) High risk for Delirium Tremens PMHx of Parkinson's disease -Provide supportive care -CIWA protocol -Thiamine, Folic acid, MV -MRI Brain showed no acute intracranial abnormality  -Can resume home Sinemet once mental status improves   Diabetes mellitus type I DKA on admission now resolved -CBGs -Sliding scale insulin -Follow ICU hyper/hypoglycemia protocol -Hold home Meds    Anemia without s/sx of overt blood loss -Monitor for S/Sx of bleeding -Trend CBC -Heparin SQ for VTE Prophylaxis  -Transfuse for Hgb <7  Best practice:  Diet:  NPO Pain/Anxiety/Delirium protocol (if indicated): Yes (RASS goal -1) VAP protocol (if indicated): Yes DVT prophylaxis: Subcutaneous Heparin GI prophylaxis: PPI Glucose control:  SSI Yes Central venous access:  Yes, and it is still needed Arterial line:  Yes, and it is still needed Foley:  Yes, and it is still needed Mobility:  bed rest  PT consulted: N/A Last date of multidisciplinary goals of care discussion  [10/5] Code Status:  DNR Disposition: ICU   = Goals of Care = Code Status Order: _0 @   Primary Emergency ContactLinley, John Reilly, Home Phone: [phone] Discussed CODE STATUS with patient's wife John Reilly who confirms that patient discussed with her yesterday and told her that he does not want any resuscitative efforts including CPR and intubation  Critical care time: 45 minutes     Rufina Falco, DNP, CCRN, FNP-C, AGACNP-BC Acute Care Nurse Practitioner  [facility] Pager: [phone] [place] at [facility]  .

## 2021-03-11 NOTE — Procedures (Signed)
Central Venous Catheter Insertion Procedure Note  John Reilly  [id number]  [birth year]  Date:03-02-21  Time:2:51 AM   Provider Performing:Panayiotis [place] A Caidance Sybert   Procedure: Insertion of Non-tunneled Central Venous Catheter(36556) with US guidance ([id number])   Indication(s) Medication administration and Difficult access  Consent Unable to obtain consent due to emergent nature of procedure.  Anesthesia Topical only with 1% lidocaine   Timeout Verified patient identification, verified procedure, site/side was marked, verified correct patient position, special equipment/implants available, medications/allergies/relevant history reviewed, required imaging and test results available.  Sterile Technique Maximal sterile technique including full sterile barrier drape, hand hygiene, sterile gown, sterile gloves, mask, hair covering, sterile ultrasound probe cover (if used).  Procedure Description Area of catheter insertion was cleaned with chlorhexidine and draped in sterile fashion.  With real-time ultrasound guidance a central venous catheter was placed into the left internal jugular vein. Nonpulsatile blood flow and easy flushing noted in all ports.  The catheter was sutured in place and sterile dressing applied.  Complications/Tolerance None; patient tolerated the procedure well. Chest X-ray is ordered to verify placement for internal jugular or subclavian cannulation.   Chest x-ray is not ordered for femoral cannulation.  EBL Minimal  Specimen(s) None  Rufina Falco, DNP, CCRN, FNP-C, AGACNP-BC Acute Care Nurse Practitioner  [facility] Pager: [phone] [place] at [facility]

## 2021-03-11 NOTE — Progress Notes (Signed)
Compassionate extubation completed per MD order at [facility]

## 2021-03-11 NOTE — Death Summary Note (Signed)
65 year old male with type 1 diabetes, COPD, hypertension, alcohol abuse, Parkinson's disease (?) , hyperlipidemia who presented from a skilled nursing facility to the ED on 9/26 with confusion from a skilled nursing facility.     Patient was recently hospitalized from 9/12 through 9/20 and he was found hypoglycemic and unresponsive by his wife.  In the hospital he was treated for sepsis secondary to aspiration pneumonia.  He was discharged to skilled nursing facility.   In the ED, noted to be in DKA and had septic shock secondary to suspected pneumonia (possibly aspiration pneumonia) and COVID-19 infection.  Admitted to the ICU, started on broad-spectrum antibiotics, Levophed and insulin infusions. His condition improved and he was transferred out of the ICU and to the triad hospitalist service.  Subsequently the patient's mental status deteriorated.  Urinalysis drawn on 10/4 was concerning for infection.  Started on empiric antibiotics.  Mental status remained poor.  Patient had a notable decompensation on 10/5 in the afternoon where he was unable to take oral medications.  Discussed via phone with wife.  Patient has not received his Sinemet in several days.  Instructed bedside RN to place NGT and crush immediate release Sinemet to be administered this route.  At approximately 2000 on 10/5 rapid response was called for decreased level responsiveness.  Oriented practitioner to respond to bedside.  Stat ABG demonstrated hypoxic hypercarbic respiratory failure.  Patient was emergently transferred to intensive care unit endotracheally intubated.  Shock physiology.  Multiple pressors.  Patient remained critically ill.  Family was updated.  On 2023-02-17 after discussion with patient's family the decision was made to proceed with compassionate extubation.  Respiratory therapy and bedside nurse present at bedside.  Patient was extubated at approximately 08 100.  Informed by bedside RN patient had passed at [phone].   Death was pronounced by 2 RNs.  Appreciate swift response of or a cross covering critical care team.  Time of death [phone] on 02-17-23  Problem list: Septic shock secondary to COVID-19 pneumonia Possible aspiration pneumonia Acute metabolic encephalopathy Hypoalbuminemia Bilateral pleural effusions Severe protein calorie malnutrition Acute kidney injury Anion gap metabolic acidosis Orthostatic hypotension Diabetes mellitus type 1 with DKA Parkinson's disease

## 2021-03-11 NOTE — Progress Notes (Signed)
Pt passed at 0833 post extubation.  Wife was at bedside.  Death pronounced by 2 RN's, Randall Hiss and Judson Roch.  No belongings to return to family.

## 2021-03-11 NOTE — Procedures (Signed)
Intubation Procedure Note  John Reilly  [id number]  January 29, 1956  Date:20-Feb-2021  Time:2:50 AM   Provider Performing:Julianna Vanwagner A John Reilly    Procedure: Intubation (70964)  Indication(s) Respiratory Failure  Consent Unable to obtain consent due to emergent nature of procedure.   Anesthesia Etomidate, Fentanyl, and Rocuronium   Time Out Verified patient identification, verified procedure, site/side was marked, verified correct patient position, special equipment/implants available, medications/allergies/relevant history reviewed, required imaging and test results available.   Sterile Technique Usual hand hygeine, masks, and gloves were used   Procedure Description Patient positioned in bed supine.  Sedation given as noted above.  Patient was intubated with endotracheal tube using Glidescope.  View was Grade 1 full glottis .  Number of attempts was 1.  Colorimetric CO2 detector was consistent with tracheal placement.   Complications/Tolerance None; patient tolerated the procedure well. Chest X-ray is ordered to verify placement.   EBL Minimal   Specimen(s) None   John Falco, DNP, CCRN, FNP-C, AGACNP-BC Acute Care Nurse Practitioner  [facility] Pager: [phone] [place] [facility]

## 2021-03-11 DEATH — deceased

## 2021-03-27 ENCOUNTER — Ambulatory Visit: Payer: Medicare Other | Admitting: Podiatry

## 2022-01-13 ENCOUNTER — Ambulatory Visit (INDEPENDENT_AMBULATORY_CARE_PROVIDER_SITE_OTHER): Payer: Medicare Other | Admitting: Vascular Surgery

## 2022-12-22 IMAGING — MR MR THORACIC SPINE W/O CM
6 series · 29 of 48 positions shown · non-contrast
Comparison: CT abdomen 12/18/2019. Thoracic radiography 11/18/2020.

CLINICAL DATA: History of Parkinson's disease. Recent fall with
back pain subsequent to that.

EXAM:
MRI THORACIC SPINE WITHOUT CONTRAST
TECHNIQUE: Multiplanar, multisequence MR imaging of the thoracic spine was
performed. No intravenous contrast was administered.

[Series 18: T1 · sagittal · 6.0mm · 1.88mm/px · 2 of 9 slices shown (1 of 2)]
[im 1/9]
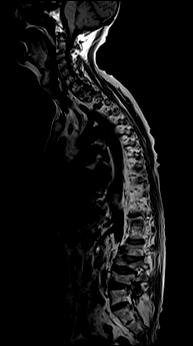
[im 9/9]
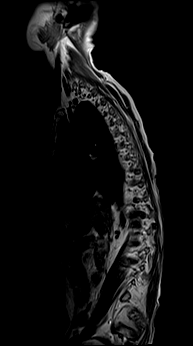

[Series 19: T2 · sagittal · 3.0mm · 1.06mm/px · 6 of 17 slices shown (1 of 2)]
[im 1/17]
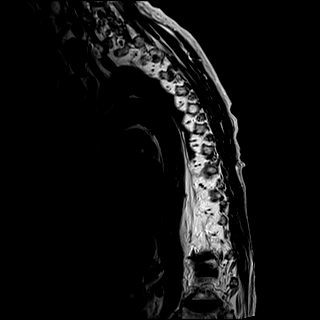
[im 4/17]
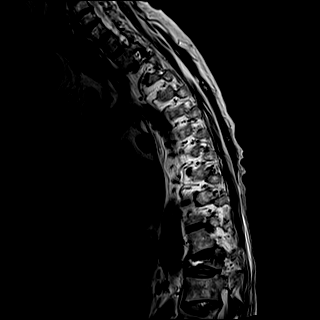
[im 7/17]
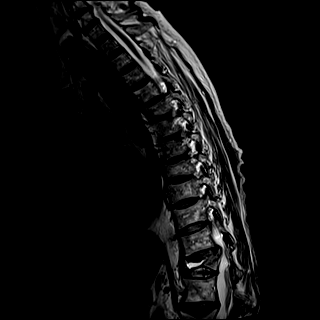
[im 10/17]
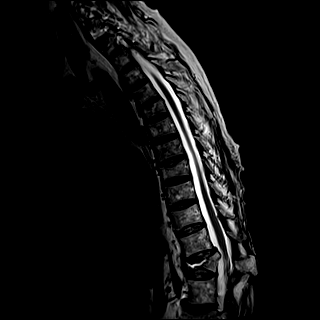
[im 13/17]
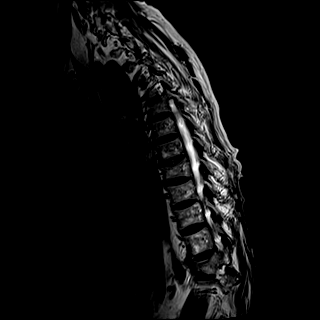
[im 17/17]
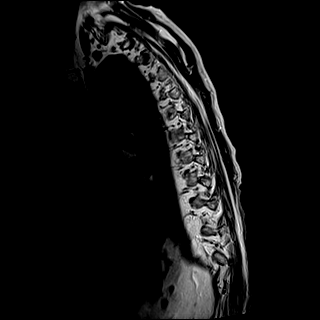

[Series 20: T1 · sagittal · 3.0mm · 1.06mm/px · 6 of 17 slices shown (2 of 2)]
[im 1/17]
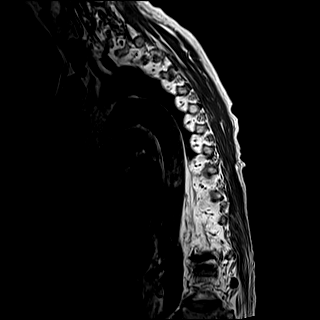
[im 4/17]
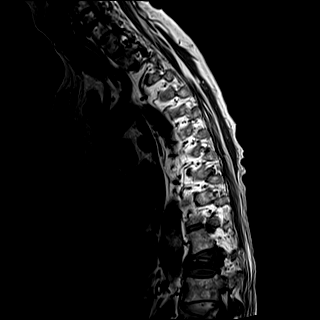
[im 7/17]
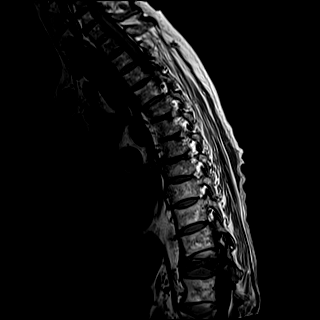
[im 10/17]
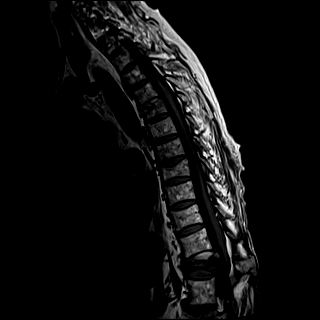
[im 13/17]
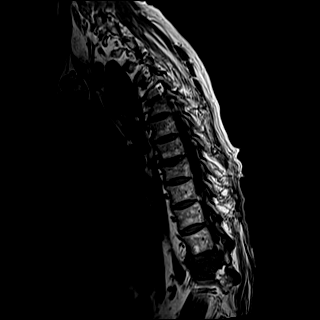
[im 17/17]
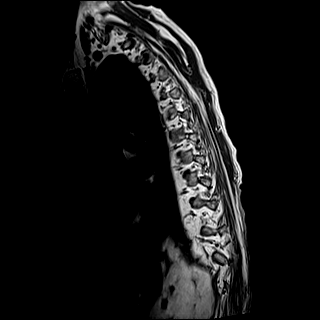

[Series 21: STIR · sagittal · 3.0mm · 0.53mm/px · 6 of 17 slices shown]
[im 1/17]
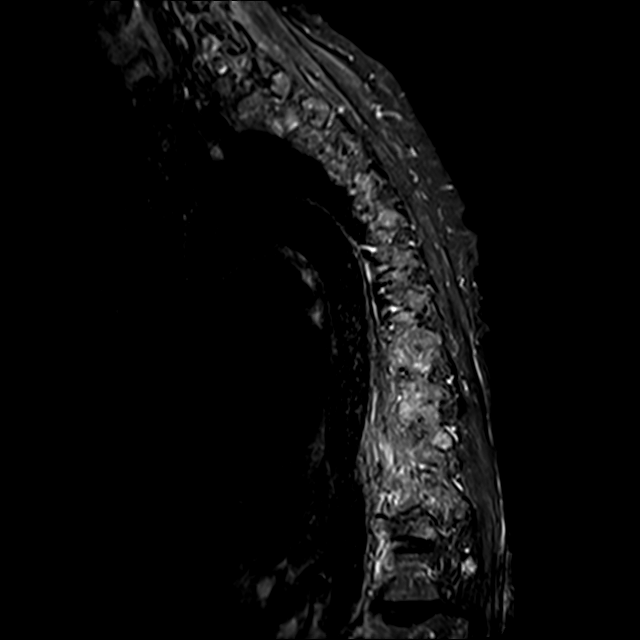
[im 4/17]
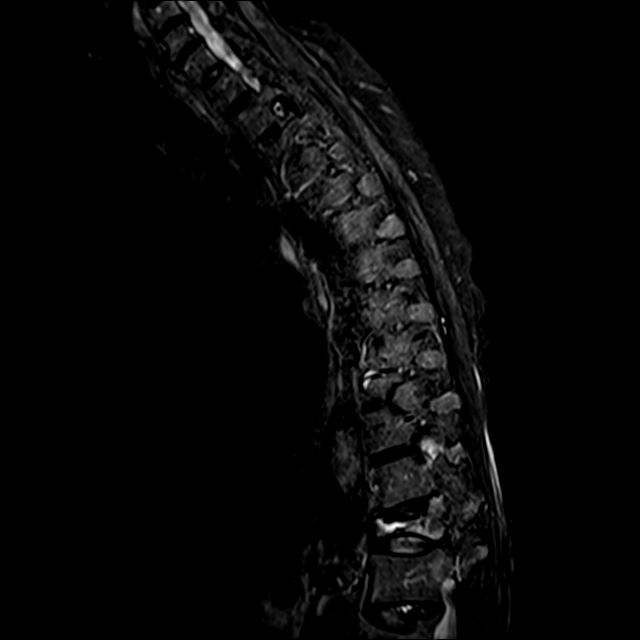
[im 7/17]
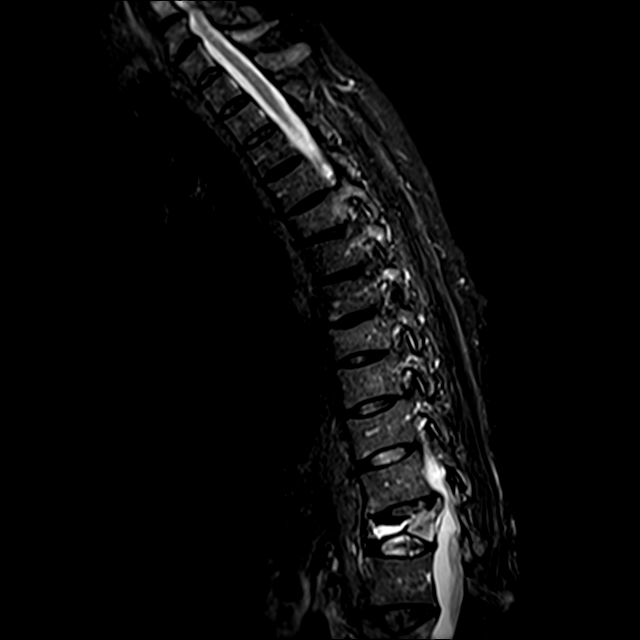
[im 10/17]
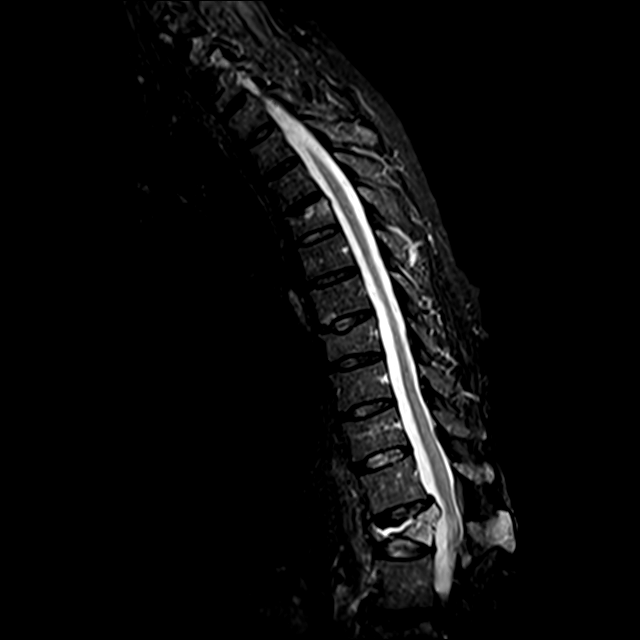
[im 13/17]
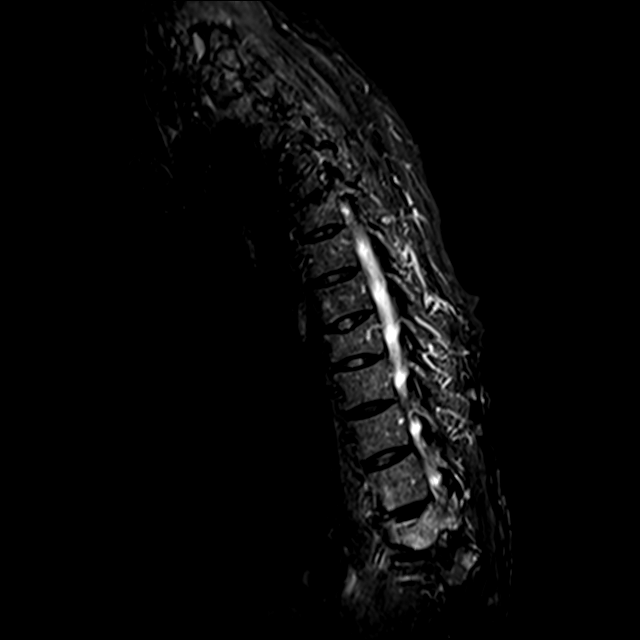
[im 17/17]
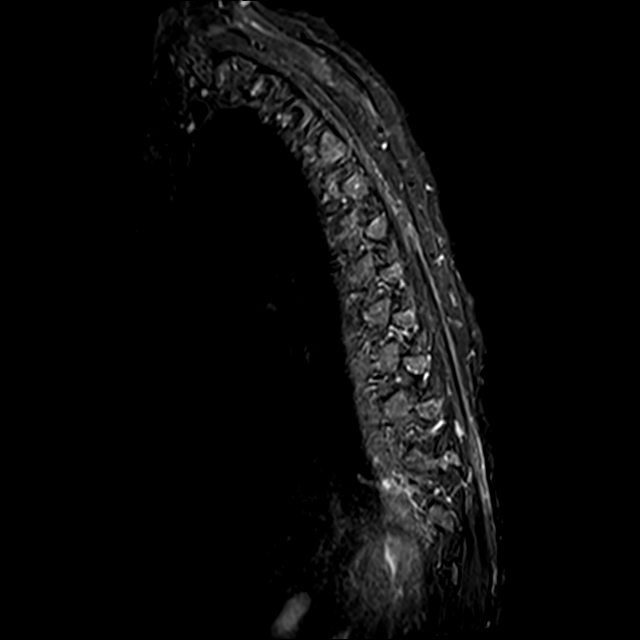

[Series 22: T2 · axial · 4.0mm · 0.59mm/px · z∈[-341,-99]mm · 8 of 41 slices shown (2 of 2)]
[im 1/41]
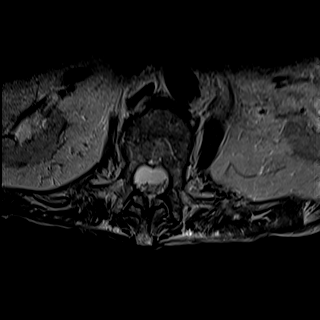
[im 7/41]
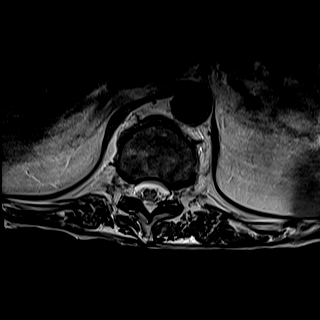
[im 13/41]
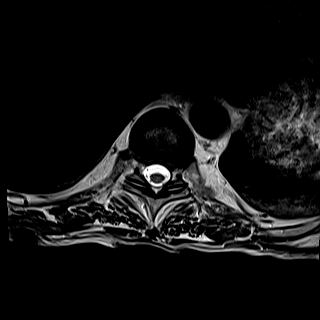
[im 19/41]
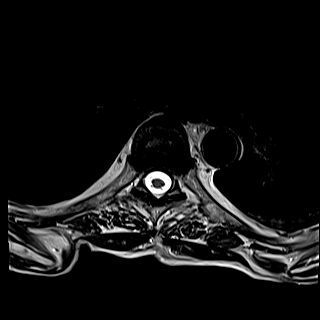
[im 22/41]
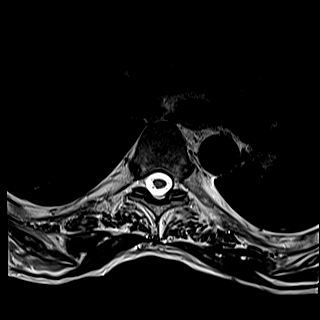
[im 28/41]
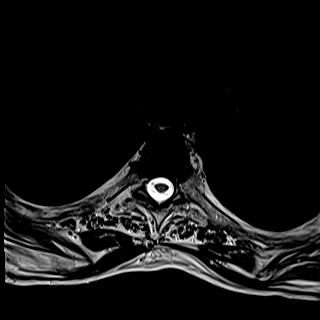
[im 34/41]
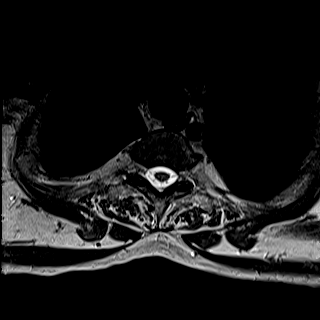
[im 41/41]
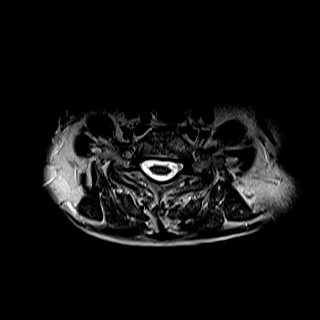

[Series 23: GRE · axial · 4.0mm · 0.37mm/px · 1 of 41 slices shown]
[im 1/41]
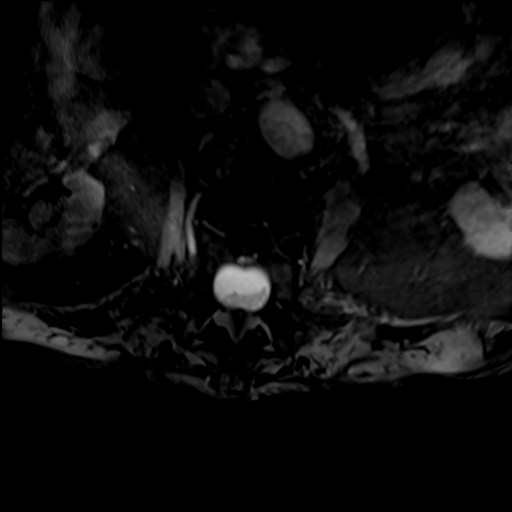

[29 of 48 positions shown; findings below may reference images not displayed]

FINDINGS: Alignment: Mild scoliotic curvature. Kyphotic curvature at the
T11-L1 level related to the T12 fracture described below.

Vertebrae: Old healed minor superior endplate compression
deformities at T5 and T8. Acute anterior compression fracture at T12
with loss of height anteriorly 75%. Posterior bowing of the
posterior margin of the vertebral body measuring 6 mm, encroaching
upon the canal. Scout view also shows old appearing compression
deformities at L2, L3 and L4.

Cord: No cord compression or primary cord lesion. Subarachnoid space
anterior to the distal cord is narrowed at the upper T12 level but
the distal cord/conus does not appear compressed.

Paraspinal and other soft tissues: Negative

Disc levels:

No disc level pathology in the upper or midthoracic region. No
traumatic disc herniation at T11-12 or T12-L1. Minimal disc bulges
at those 2 levels.
IMPRESSION: Acute or subacute compression fracture at T12 with loss of height
anteriorly of 75%. Posterior bowing of the posterosuperior margin of
the vertebral body measuring 6 mm, encroaching upon the spinal canal
but not grossly compressing the distal cord/conus. This looks like a
benign fracture.

Old healed partial compression fractures at T5, T8, L2, L3-L4.
